# Patient Record
Sex: Female | Born: 1945
Health system: Southern US, Community
[De-identification: ages and names within clinical notes are randomized; demographics above are authoritative.]

## PROBLEM LIST (undated history)

## (undated) DIAGNOSIS — R202 Paresthesia of skin: Secondary | ICD-10-CM

## (undated) DIAGNOSIS — Z973 Presence of spectacles and contact lenses: Secondary | ICD-10-CM

## (undated) DIAGNOSIS — K759 Inflammatory liver disease, unspecified: Secondary | ICD-10-CM

## (undated) DIAGNOSIS — M48061 Spinal stenosis, lumbar region without neurogenic claudication: Secondary | ICD-10-CM

## (undated) DIAGNOSIS — M1990 Unspecified osteoarthritis, unspecified site: Secondary | ICD-10-CM

## (undated) DIAGNOSIS — I10 Essential (primary) hypertension: Secondary | ICD-10-CM

## (undated) DIAGNOSIS — E119 Type 2 diabetes mellitus without complications: Secondary | ICD-10-CM

## (undated) DIAGNOSIS — R200 Anesthesia of skin: Secondary | ICD-10-CM

## (undated) DIAGNOSIS — R410 Disorientation, unspecified: Secondary | ICD-10-CM

## (undated) DIAGNOSIS — R351 Nocturia: Secondary | ICD-10-CM

## (undated) DIAGNOSIS — R413 Other amnesia: Secondary | ICD-10-CM

## (undated) HISTORY — PX: BACK SURGERY: SHX140

## (undated) HISTORY — PX: COLONOSCOPY: SHX174

## (undated) HISTORY — DX: Essential (primary) hypertension: I10

## (undated) HISTORY — DX: Spinal stenosis, lumbar region without neurogenic claudication: M48.061

## (undated) HISTORY — PX: JOINT REPLACEMENT: SHX530

## (undated) HISTORY — DX: Unspecified osteoarthritis, unspecified site: M19.90

---

## 1999-04-16 ENCOUNTER — Other Ambulatory Visit: Admission: RE | Admit: 1999-04-16 | Discharge: 1999-04-16 | Payer: Self-pay | Admitting: Obstetrics and Gynecology

## 2000-01-15 ENCOUNTER — Other Ambulatory Visit: Admission: RE | Admit: 2000-01-15 | Discharge: 2000-01-15 | Payer: Self-pay | Admitting: Obstetrics and Gynecology

## 2001-12-27 ENCOUNTER — Emergency Department (HOSPITAL_COMMUNITY): Admission: EM | Admit: 2001-12-27 | Discharge: 2001-12-27 | Payer: Self-pay | Admitting: *Deleted

## 2001-12-27 ENCOUNTER — Encounter: Payer: Self-pay | Admitting: *Deleted

## 2008-07-31 ENCOUNTER — Encounter: Payer: Self-pay | Admitting: Family Medicine

## 2009-08-10 ENCOUNTER — Encounter: Payer: Self-pay | Admitting: Family Medicine

## 2010-01-15 ENCOUNTER — Encounter: Payer: Self-pay | Admitting: Family Medicine

## 2010-01-16 LAB — CONVERTED CEMR LAB
HDL: 73 mg/dL
LDL Cholesterol: 95 mg/dL

## 2010-01-29 ENCOUNTER — Encounter: Payer: Self-pay | Admitting: Family Medicine

## 2010-01-29 ENCOUNTER — Ambulatory Visit: Payer: Self-pay | Admitting: Anesthesiology

## 2010-04-10 ENCOUNTER — Encounter: Payer: Self-pay | Admitting: Family Medicine

## 2010-04-10 LAB — CONVERTED CEMR LAB: Pap Smear: ABNORMAL

## 2010-05-21 ENCOUNTER — Ambulatory Visit: Payer: Self-pay

## 2010-05-21 ENCOUNTER — Encounter: Payer: Self-pay | Admitting: Family Medicine

## 2010-06-17 ENCOUNTER — Encounter: Admission: RE | Admit: 2010-06-17 | Discharge: 2010-06-17 | Payer: Self-pay | Admitting: Chiropractor

## 2010-08-06 ENCOUNTER — Ambulatory Visit: Payer: Self-pay | Admitting: Family Medicine

## 2010-08-06 DIAGNOSIS — B351 Tinea unguium: Secondary | ICD-10-CM | POA: Insufficient documentation

## 2010-08-06 DIAGNOSIS — I10 Essential (primary) hypertension: Secondary | ICD-10-CM | POA: Insufficient documentation

## 2010-08-06 DIAGNOSIS — L680 Hirsutism: Secondary | ICD-10-CM | POA: Insufficient documentation

## 2010-08-07 ENCOUNTER — Encounter: Payer: Self-pay | Admitting: Family Medicine

## 2010-08-13 ENCOUNTER — Ambulatory Visit: Payer: Self-pay | Admitting: Family Medicine

## 2010-08-19 ENCOUNTER — Encounter: Payer: Self-pay | Admitting: Family Medicine

## 2010-09-24 ENCOUNTER — Encounter: Payer: Self-pay | Admitting: Family Medicine

## 2010-09-29 ENCOUNTER — Encounter: Payer: Self-pay | Admitting: Unknown Physician Specialty

## 2010-10-08 NOTE — Assessment & Plan Note (Signed)
Summary: shingles  Nurse Visit   Allergies: No Known Drug Allergies  Immunizations Administered:  Zostavax # 0:    Vaccine Type: Zostavax    Site: left deltoid    Mfr: Merck    Dose: 0.5 ml    Route: [place]    Given by: Lewanda Rife LPN    Exp. Date: 04/26/2011    Lot #: [id number]    VIS given: 06/20/05 given August 13, 2010.  Orders Added: 1)  Zoster (Shingles) Vaccine Live [90736] 2)  Admin 1st Vaccine [id number]

## 2010-10-08 NOTE — Assessment & Plan Note (Signed)
Summary: NEW PT TO EST/CLE   Vital Signs:  Patient profile:   65 year old female Height:      62.50 inches Weight:      210.50 pounds BMI:     38.02 Temp:     98.7 degrees F oral Pulse rate:   68 / minute Pulse rhythm:   regular BP sitting:   124 / 82  (right arm) Cuff size:   large  Vitals Entered By: Linde Gillis CMA Duncan Dull) (August 06, 2010 10:52 AM) CC: new patient, establish care   History of Present Illness: 65 yo here to establish care with multiple concerns.  1 . Back pain- was seeing Dr. Dorthey Sawyer, chiropractor for sciatica.  Brings in MRI results today, has appt with ortho next week. MRI reviewed- shows degenerative disc disease at multiple levels, anterolisthesis, disc buldging. Pt denies any LE weakness or urinary syptoms.  2.  HTN- on Lsinipril/HCTZ 10/12.5 mg daily.  No CP, blurred vision, or SOB.  3.  Nail fungus- has noticed toe nail fungus on her big toes.  Using Vicks vapor rub, not helping.  4.  Facial hair- wants me to prescribe prescription strength hair removal products for her unwanted facial hair.  Brings in lab results, mammogram and pap smear results.  Preventive Screening-Counseling & Management  Caffeine-Diet-Exercise     Does Patient Exercise: no      Drug Use:  no.    Allergies (verified): No Known Drug Allergies  Past History:  Family History: Last updated: 08/06/2010 Family History Hypertension Family History Ovarian cancer  Social History: Last updated: 08/06/2010 Alcohol use-no Drug use-no Regular exercise-no  Risk Factors: Exercise: no (08/06/2010)  Past Medical History: Hypertension  Past Surgical History: Denies surgical history  Family History: Family History Hypertension Family History Ovarian cancer  Social History: Alcohol use-no Drug use-no Regular exercise-no Drug Use:  no Does Patient Exercise:  no  Review of Systems      See HPI General:  Denies malaise. Eyes:  Denies blurring. ENT:  Denies  difficulty swallowing. CV:  Denies chest pain or discomfort. Resp:  Denies shortness of breath. GI:  Denies abdominal pain and change in bowel habits. GU:  Denies dysuria. MS:  Denies muscle weakness. Derm:  Denies rash. Neuro:  Complains of tingling; denies weakness. Psych:  Denies anxiety and depression. Endo:  Denies cold intolerance and heat intolerance. Heme:  Denies abnormal bruising and enlarge lymph nodes.  Physical Exam  General:  alert and overweight-appearing.   Head:  normocephalic, atraumatic, and no abnormalities observed.   Eyes:  vision grossly intact, pupils equal, pupils round, and pupils reactive to light.   Ears:  R ear normal and L ear normal.   Nose:  no external deformity.   Mouth:  good dentition.   Neck:  No deformities, masses, or tenderness noted. Lungs:  Normal respiratory effort, chest expands symmetrically. Lungs are clear to auscultation, no crackles or wheezes. Heart:  Normal rate and regular rhythm. S1 and S2 normal without gallop, murmur, click, rub or other extra sounds. Abdomen:  Bowel sounds positive,abdomen soft and non-tender without masses, organomegaly or hernias noted. Msk:  normal ROM.   Extremities:  no edema onchomycosis of great toe nails bilaterally. Neurologic:  gait normal.   Skin:  Intact without suspicious lesions or rashes Psych:  Cognition and judgment appear intact. Alert and cooperative with normal attention span and concentration. No apparent delusions, illusions, hallucinations   Impression & Recommendations:  Problem # 1:  DERMATOPHYTOSIS OF  NAIL (ICD-110.1) Assessment New advised trying OTC lamisil first, return to clinic if no improvement within several weeks.  Problem # 2:  HIRSUTISM (ICD-704.1) Time spent with patient 45 minutes, more than 50% of this time was spent counseling patient on her on several issues, including nutrition, zostavax, and her other concerns. She was upset that I refused to prescribe rx for  hair removal but this is outside my scope of practice.  Offered to sent to dermatology but she refused.  Problem # 3:  HYPERTENSION (ICD-401.9) Assessment: Unchanged Stable on current dose of meds. BMET within normal limits. Her updated medication list for this problem includes:    Lisinopril-hydrochlorothiazide 10-12.5 Mg Tabs (Lisinopril-hydrochlorothiazide) .Marland Kitchen... Take one tablet by mouth daily  Complete Medication List: 1)  Lisinopril-hydrochlorothiazide 10-12.5 Mg Tabs (Lisinopril-hydrochlorothiazide) .... Take one tablet by mouth daily 2)  Super B Complex Tabs (B complex-c) .... Take one tablet by mouth daily 3)  Centrum Silver Tabs (Multiple vitamins-minerals) .... Take one tablet by mouth daily 4)  Fish Oil 1000 Mg Caps (Omega-3 fatty acids) .... Take one tablet by mouth daily 5)  Calcium-vitamin D 600-200 Mg-unit Tabs (Calcium-vitamin d) .... Take one tablet by mouth daily 6)  Vitamin D3 5000 Unit Caps (Cholecalciferol) .... Take one tablet by mouth daily 7)  Mineral Rich  .... Take one tablet by mouth daily 8)  Memorall  .... Take one tablet by mouth daily  Other Orders: Tdap => 46yrs IM (16109) Admin 1st Vaccine (60454)   Orders Added: 1)  Tdap => 54yrs IM [90715] 2)  Admin 1st Vaccine [90471] 3)  New Patient Level IV [09811]   Immunizations Administered:  Tetanus Vaccine:    Vaccine Type: Tdap    Site: left deltoid    Mfr: GlaxoSmithKline    Dose: 0.5 ml    Route: IM    Given by: Linde Gillis CMA (AAMA)    Exp. Date: 06/27/2012    Lot #: [id number]    VIS given: 07/26/08 version given August 06, 2010.   Immunizations Administered:  Tetanus Vaccine:    Vaccine Type: Tdap    Site: left deltoid    Mfr: GlaxoSmithKline    Dose: 0.5 ml    Route: IM    Given by: Linde Gillis CMA (AAMA)    Exp. Date: 06/27/2012    Lot #: [id number]    VIS given: 07/26/08 version given August 06, 2010.  Current Allergies (reviewed today): No known allergies   TD  Result Date:  08/06/2010 TD Result:  given HDL Result Date:  01/16/2010 HDL Result:  73 LDL Result Date:  01/16/2010 LDL Result:  95 PAP Result Date:  04/10/2010 PAP Result:  abnormal Mammogram Result Date:  01/29/2010 Mammogram Result:  normal Mammogram Next Due:  1 yr    Past Medical History:    Hypertension  Past Surgical History:    Denies surgical history

## 2010-10-08 NOTE — Letter (Signed)
Summary: [facility]  Dept [facility]ammography/[facility]   Imported By: Lester Golf 08/10/2010 10:03:27  _____________________________________________________________________  External Attachment:    Type:   Image     Comment:   External Document

## 2010-10-08 NOTE — Consult Note (Signed)
Summary: Celesta Gentile MD  Celesta Gentile MD   Imported By: Lester Gene Autry 08/15/2010 13:12:18  _____________________________________________________________________  External Attachment:    Type:   Image     Comment:   External Document

## 2010-10-10 NOTE — Miscellaneous (Signed)
Summary: [facility]  [facility]   Imported By: Beau Fanny 10/02/2010 10:01:35  _____________________________________________________________________  External Attachment:    Type:   Image     Comment:   External Document

## 2010-10-10 NOTE — Procedures (Signed)
Summary: Colonoscopy by Dr. Sunny Schlein  Colonoscopy by Dr. Sunny Schlein   Imported By: Beau Fanny 08/23/2010 16:06:31  _____________________________________________________________________  External Attachment:    Type:   Image     Comment:   External Document  Appended Document: Colonoscopy by Dr. Sunny Schlein diverticulosis of descending and sigmoid colon. repeat in 5-10 years for screening

## 2011-05-15 ENCOUNTER — Emergency Department (HOSPITAL_COMMUNITY)
Admission: EM | Admit: 2011-05-15 | Discharge: 2011-05-15 | Discharge status: Against Medical Advice | Payer: Self-pay | Attending: Emergency Medicine | Admitting: Emergency Medicine

## 2011-05-15 DIAGNOSIS — Z0389 Encounter for observation for other suspected diseases and conditions ruled out: Secondary | ICD-10-CM | POA: Insufficient documentation

## 2011-05-16 ENCOUNTER — Encounter: Payer: Self-pay | Admitting: Family Medicine

## 2011-05-16 ENCOUNTER — Emergency Department (HOSPITAL_COMMUNITY): Payer: Self-pay

## 2011-05-16 ENCOUNTER — Emergency Department (HOSPITAL_COMMUNITY)
Admission: EM | Admit: 2011-05-16 | Discharge: 2011-05-16 | Disposition: A | Discharge status: Home / Self Care | Payer: Self-pay | Attending: Emergency Medicine | Admitting: Emergency Medicine

## 2011-05-16 DIAGNOSIS — M79609 Pain in unspecified limb: Secondary | ICD-10-CM | POA: Insufficient documentation

## 2011-05-16 DIAGNOSIS — M5430 Sciatica, unspecified side: Secondary | ICD-10-CM | POA: Insufficient documentation

## 2011-05-16 DIAGNOSIS — I10 Essential (primary) hypertension: Secondary | ICD-10-CM | POA: Insufficient documentation

## 2011-05-16 LAB — HM PAP SMEAR

## 2011-05-16 LAB — HM MAMMOGRAPHY

## 2011-05-19 ENCOUNTER — Ambulatory Visit (INDEPENDENT_AMBULATORY_CARE_PROVIDER_SITE_OTHER): Payer: Self-pay | Admitting: Family Medicine

## 2011-05-19 VITALS — BP 140/100 | HR 61 | Temp 97.4°F

## 2011-05-19 DIAGNOSIS — M5430 Sciatica, unspecified side: Secondary | ICD-10-CM

## 2011-05-19 MED ORDER — OXYCODONE-ACETAMINOPHEN 10-325 MG PO TABS: 1.0000 | ORAL_TABLET | Freq: Four times a day (QID) | ORAL | Status: DC | PRN

## 2011-05-19 NOTE — Progress Notes (Signed)
Subjective:    Patient ID: [name], female    DOB: [birth year], 65 y.o.   MRN: [id number]  HPI  65 yo here for ER follow up.  Has known h/o L4-L5 spinal stenosis, left sided radiculopathy. Was being followed by [facility].  Stopped going because pt did not want to take prednisone regularly. Has also been seeing her chiropractor regularly which was not helping.    MRI in 05/2010 showed L4_L5 buldging disc with spinal stenosis measuring 8 mm and arthritis.  Went to ER over the weekend because she could not walk at all without severe pain. Pain is mainly left lower leg, with severe tingling as well.  Notes reviewed.    Xray of SI joints neg,  Lumbar spine film showed multilevel degenerative disc and joint disease, primarily L4-5 with grade 1 spondyloisthesis. Given Norco and advised to follow up with Ortho.  Pt here today with her son- very tearful.  Has lost her insurance and cannot afford to return to ortho but cannot continue to be in this much pain. According to her son, she is receiving assistance through  assistance program and would like to be referred [facility].  Patient Active Problem List  Diagnoses  . DERMATOPHYTOSIS OF NAIL  . HYPERTENSION  . HIRSUTISM  . Sciatica neuralgia   Past Medical History  Diagnosis Date  . Hypertension    No past surgical history on file. History  Substance Use Topics  . Smoking status: Not on file  . Smokeless tobacco: Not on file  . Alcohol Use: No   Family History  Problem Relation Age of Onset  . Hypertension Other   . Cancer Other     ovarian   No Known Allergies Current Outpatient Prescriptions on File Prior to Visit  Medication Sig Dispense Refill  . lisinopril-hydrochlorothiazide (PRINZIDE,ZESTORETIC) 10-12.5 MG per tablet Take 1 tablet by mouth daily.        . B Complex-C (SUPER B COMPLEX PO) Take 1 tablet by mouth daily.        . Calcium Carbonate-Vitamin D  (CALCIUM-VITAMIN D) 600-200 MG-UNIT CAPS Take 1 capsule by mouth daily.        . Cholecalciferol (VITAMIN D-3) 5000 UNITS TABS Take 1 tablet by mouth daily.        . Multiple Vitamins-Minerals (CENTRUM SILVER PO) Take 1 tablet by mouth daily.        . NON FORMULARY Memoral (take one tablet by mouth daily)       . NON FORMULARY Mineral Rich (take one tablet by mouth daily)       . Omega-3 Fatty Acids (FISH OIL) 1000 MG CAPS Take 1 capsule by mouth daily.         The PMH, PSH, Social History, Family History, Medications, and allergies have been reviewed in [facility], and have been updated if relevant.   Review of Systems    See HPI Objective:   Physical Exam BP 140/100  Pulse 61  Temp(Src) 97.4 F (36.3 C) (Oral) Gen:  Alert, tearful, appears uncomfortable while seated. Psych:  Anxious appearing.      Assessment & Plan:   1. Sciatica neuralgia  Ambulatory referral to Pain Clinic   >25 min spent with face to face with patient, >50% counseling and/or coordinating care. Unfortunately, pt has lost her insurance and has worsening of her left sided radiculopathy. Will refer to [facility]. Given rx for Percocet. See pt instructions for  details.

## 2011-05-19 NOTE — Patient Instructions (Signed)
Please stop by to see [name] on your way out. Please do not take Tylenol of any form while you are taking Percocet.

## 2011-05-21 ENCOUNTER — Telehealth: Payer: Self-pay | Admitting: *Deleted

## 2011-05-21 NOTE — Telephone Encounter (Signed)
Patient is asking for her x-ray results.  She is questioning if her hip is broken.  She says she is doing worse.

## 2011-05-21 NOTE — Telephone Encounter (Signed)
Her SI joints and tail bone were normal bilaterally. Her back xray showed the changes we would expect based on her MRI done last year. There were no fractures evident. Once again, if her pain is worsening, I strongly urge her to see Dr. Gerrit Heck immediately so we can work this up further.

## 2011-05-21 NOTE — Telephone Encounter (Signed)
Called patient and advised her as instructed via telephone.  She stated that her main question is was there anything significant seen on her hip xray that may be the cause of her pain?  Fracture, etc other than inflammation?  Please advise.

## 2011-05-21 NOTE — Telephone Encounter (Signed)
Advised patient of results  Patient says that she will call and set up appt with ortho

## 2011-05-21 NOTE — Telephone Encounter (Signed)
We didn't do xrays here.  They were done in ER. Please print xray results from ER note and give to pt. If her pain is worse, we really need her to see her orthopedist, Dr. Gerrit Heck, until we can get in with pain management.

## 2011-06-03 ENCOUNTER — Telehealth: Payer: Self-pay | Admitting: *Deleted

## 2011-06-03 NOTE — Telephone Encounter (Signed)
I cannot safely increase the dose as I do not manage chronic pain. Once she has her appointment with pain management, hopefully they can improve her pain.

## 2011-06-03 NOTE — Telephone Encounter (Signed)
Patient advised as instructed via telephone.  She stated that her appt with pain management is this Friday.  She will let us know how it goes.

## 2011-06-03 NOTE — Telephone Encounter (Signed)
Pt states that her pain meds only help her pain for about 2 hours. She is asking to increase the doses.  Please advise.

## 2011-06-06 ENCOUNTER — Encounter: Payer: Self-pay | Attending: Physical Medicine and Rehabilitation | Admitting: Physical Medicine and Rehabilitation

## 2011-06-06 ENCOUNTER — Other Ambulatory Visit: Payer: Self-pay | Admitting: *Deleted

## 2011-06-06 DIAGNOSIS — M79609 Pain in unspecified limb: Secondary | ICD-10-CM | POA: Insufficient documentation

## 2011-06-06 DIAGNOSIS — M545 Low back pain, unspecified: Secondary | ICD-10-CM | POA: Insufficient documentation

## 2011-06-06 DIAGNOSIS — M48061 Spinal stenosis, lumbar region without neurogenic claudication: Secondary | ICD-10-CM

## 2011-06-06 DIAGNOSIS — IMO0002 Reserved for concepts with insufficient information to code with codable children: Secondary | ICD-10-CM | POA: Insufficient documentation

## 2011-06-06 DIAGNOSIS — G894 Chronic pain syndrome: Secondary | ICD-10-CM

## 2011-06-06 MED ORDER — OXYCODONE-ACETAMINOPHEN 10-325 MG PO TABS: 1.0000 | ORAL_TABLET | Freq: Four times a day (QID) | ORAL | Status: DC | PRN

## 2011-06-06 NOTE — Telephone Encounter (Signed)
Rx written for #10  Will need to talk to Dr Dayton Martes about any more than this

## 2011-06-06 NOTE — Progress Notes (Signed)
[name] is a 65 year old divorced woman who is currently staying with her son, [name], who is in the room today for the history part of our history and physical.  Ms. Welle was referred kindly by Dr. Ruthe Mannan.  Ms. Tuel presents with over a year history of progressive left low back and leg pain..  She states that the leg pain is worse than the back at this time, stating she is not particularly painful; however, when she gets up to stand or walk especially her leg pain increases significantly.  She has seen chiropractor for many visits.  She has also seen and followed up with orthopedist about 1 year ago, who did an MRI at that time, the date of the visit was August 07, 2010, Dr. Gerrit Heck at the [facility].  At that time, the MRI showed L4-5 bulging disk with spinal stenosis diameter measuring 8 mm, grade 1 anterolisthesis L4 and L5 with multilevel degenerative lumbar changes noted.  I am unclear whether or not she had a followup visit.  They had at that time recommended steroid followed by nonsteroidal, activity modification and consideration of Neurontin or Flexeril as well as physical therapy. Her son inquired about using long acting narcotics and seemed to have significant interest in this treatment option.  The patient denies problems controlling bowel or bladder.  She admits to trouble walking.  She can walk about 5 minutes at a time.  She does use a wheelchair for community distances, sometimes a cane or walker.  Pain is described as sharp, burning, dull, tingling and aching in her left low back and buttock region radiating to the left lateral leg.  She reports occasional numbness in her toes as well.  Pain is worse during the day, improves if she sitting or lying down, medication also helps.  She has been recently using hydrocodone as well as oxycodone.  Sometimes she takes more oxycodone, the bottle indicates.  FUNCTIONAL STATUS:  The patient  last worked approximately 1 month ago. She worked in [employer].  She is independent currently with feeding, dressing, bathing, and toileting as well as meal prep, needs assistance with household duties and shopping.  Denies problems controlling bowel or bladder.  Admits to trouble walking and depression.  Denies suicidal ideation at this time.  REVIEW OF SYSTEMS:  Otherwise noncontributory other than some occasional constipation.  PAST MEDICAL HISTORY:  Notable for hypertension.  PAST SURGICAL HISTORY:  Right ankle fracture.  She is divorced.  She is currently staying with her son.  Denies illegal substance use.  Denies alcohol or smoking.  FAMILY HISTORY:  Father deceased at heart disease.  Mother deceased at uterine cancer.  MEDICATIONS:  She brings in today include lisinopril, B complex, calcium with D, vitamin D, multivitamin, omega-3 and oxycodone 10/325.  She reports no known drug allergies.  PHYSICAL EXAMINATION:  On exam, her blood pressure is 152/59, pulse 68, respirations 16, 97% saturated on room air.  She is a well-developed and mildly obese woman who does not appear in any distress, although at times in the interview, she was rather labile, tearful multiple times, but then laughing at others.  She is alert.  She is cooperative and pleasant.  She follows commands without difficulty.  Answers my questions appropriately.  Cranial nerves and coordination are intact.  Her reflexes are 2+ at the patellar and Achilles tendon.  No abnormal tone, clonus or tremors are noted.  Her motor strength is 5/5 at hip flexors, knee extensors, dorsiflexors,  plantar flexors, EHL.  Tandem gait and Romberg test are performed adequately.  She reports intact sensation to pinprick; however, in the lower extremities along the left lateral leg, she reports slightly decreased sensation to light touch.  Internal and external rotation at the hips does not aggravate posterior hip or groin pain.   She has well- preserved range of motion at the knees.  She has limitations in lumbar extension.  She has about 40-50 degrees of forward flexion.  She reports some pain with forward flexion as well as with extension.  IMPRESSION: 1. Lumbago. 2. Left lower extremity pain, most likely neuropathic in nature. 3. Multilevel lumbar degenerative disk disease. 4. L4-5 grade 1 anterolisthesis.  Review of radiographs on E-chart:  We will check urine drug screen.  I am considering use of gabapentin, it does not appear she has been trialed on it per records or from her history.  I am also considering repeating the MRI.  She tells me that she has limited finacial resources and she is not sure she can participate in physical therapy.  On a discussion about epidural steroid injection, she tells me that they scare her and that she may not be able to afford them either.  She seems most interested in medication management, physical therapy may be of benefit as well, here however I am not sure she is interested in pursuing that avenue of treatment.  I am going to see her back in 3 weeks to review UDS and determine treatment options.  I will try to obtain notes from previous orthopedist to review which meds she has trialed.  She seemed reluctant about the current plan. I have tried to explain to her that here at [facility], prior to prescribing medication, we typically check urine drug screen.     Brantley Stage, M.D. Electronically Signed    DMK/MedQ D:  06/06/2011 13:37:10  T:  06/06/2011 22:29:51  Job #:  [id number]

## 2011-06-06 NOTE — Telephone Encounter (Signed)
Spoke with patient and advised results, she will stop by and pick up rx

## 2011-06-06 NOTE — Telephone Encounter (Signed)
Pt states she went to pain clinic and they are going to give her another medicine, but they are checking her urine, and they told her that it could be 10-15 days before she can get anything.  She is worried that she will run out of percocet before she can get a refill on Monday from Dr. Dayton Martes, she takes 4 a day and has 8 left.  She is asking for just a few.

## 2011-06-09 ENCOUNTER — Other Ambulatory Visit: Payer: Self-pay | Admitting: *Deleted

## 2011-06-09 NOTE — Telephone Encounter (Signed)
Pt says she's trying to change her pain management doctor to another doctor within the same practice.  She is not happy with who she saw, doesn't think she has her best interest in mind.  The doctors told her they will have a meeting about this and will let her know.  In the meantime pt will run out of percocet.  I advised her you are out till Wednesday, she says she thinks she has enough medicine to last till then.

## 2011-06-11 MED ORDER — OXYCODONE-ACETAMINOPHEN 10-325 MG PO TABS
1.0000 | ORAL_TABLET | Freq: Four times a day (QID) | ORAL | Status: DC | PRN
Start: 2011-06-11 — End: 2011-07-15

## 2011-06-11 MED ORDER — OXYCODONE-ACETAMINOPHEN 10-325 MG PO TABS: 1.0000 | ORAL_TABLET | Freq: Four times a day (QID) | ORAL | Status: DC | PRN

## 2011-06-11 NOTE — Telephone Encounter (Signed)
Please telephone in as written below.

## 2011-06-11 NOTE — Telephone Encounter (Signed)
Patient advised Rx will be left at front desk for pick up.

## 2011-06-11 NOTE — Telephone Encounter (Signed)
Rx can't be called in, printed and put in your IN box for signature.

## 2011-06-16 ENCOUNTER — Other Ambulatory Visit: Payer: Self-pay | Admitting: *Deleted

## 2011-06-16 MED ORDER — LISINOPRIL-HYDROCHLOROTHIAZIDE 10-12.5 MG PO TABS: 1.0000 | ORAL_TABLET | Freq: Every day | ORAL | Status: DC

## 2011-06-16 NOTE — Assessment & Plan Note (Signed)
Phone call to [name] [phone] at approximately 11 o'clock.  I called [name] this morning to discuss her last clinic visit. She had expressed some dissatisfaction at the visit and felt that I had not listened to her as closely as I could and she was upset that her son was requested to leave the room during her physical exam.  I told her that her son was asking multiple questions during her history that I was taking, and was making it difficult for me to focus on her alone.  She stated that she realized that he was distracting during that time and she requested that he be allowed to stay in the room next time and she will ask him to remain quiet with the exception for some questions at the end of our visit.  I mentioned to her that I was able to obtain records from the [facility] that she had gone to last year.  I had incomplete records from that clinic at the time of her visit.  I was trying to determine what course of treatment she may have had.  It was my understanding at the last visit she was going to be set up for an epidural steroid injection at the L4-5 level but I believe she declined that option and did not return back to the clinic.  I have also reviewed her urine drug screen with her and told her I would like to consider repeating another lumbar MRI since it has been over 1 year since she has had one and her symptoms seem to be progressing.  She agreed to this but also mentioned that her finances do constrain her.  I gave her the number of the [facility] Financial Aid Service representative [phone].  She stated she would like to call them and see if they can help her.  She told me she would like to find out first if she can get any financial help prior to making another appointment here at our clinic.  She then asked me what the name of the medicine I was considering trying her on and I told her that I typically may try gabapentin for  symptoms of spinal stenosis.  She stated she would see if her PCP might prescribe that  for her.  I asked her several times if there is anything more I can help her with and also mentioned to her that there may be other treatment options available to her that I could review at the next visit.  Once again I clarified that it is my understanding she would like to follow up with financial aid services here at [facility] to determine what they can do for her and we will hold off on making another appointment with our clinic until she finds out what is available financially.  She states she is satisfied with what we have reviewed today in our phone conversations.  She will let us know if she has any other questions.     Brantley Stage, M.D. Electronically Signed    DMK/MedQ D:  06/13/2011 11:47:04  T:  06/13/2011 22:30:57  Job #:  [id number]

## 2011-06-17 ENCOUNTER — Other Ambulatory Visit: Payer: Self-pay | Admitting: *Deleted

## 2011-06-17 MED ORDER — LISINOPRIL-HYDROCHLOROTHIAZIDE 10-12.5 MG PO TABS: 1.0000 | ORAL_TABLET | Freq: Every day | ORAL | Status: DC

## 2011-06-27 ENCOUNTER — Other Ambulatory Visit: Payer: Self-pay | Admitting: Physical Medicine and Rehabilitation

## 2011-06-27 ENCOUNTER — Encounter: Payer: Self-pay | Attending: Physical Medicine and Rehabilitation | Admitting: Physical Medicine and Rehabilitation

## 2011-06-27 DIAGNOSIS — M545 Low back pain, unspecified: Secondary | ICD-10-CM

## 2011-06-27 DIAGNOSIS — M79604 Pain in right leg: Secondary | ICD-10-CM

## 2011-06-27 DIAGNOSIS — M79609 Pain in unspecified limb: Secondary | ICD-10-CM | POA: Insufficient documentation

## 2011-06-27 DIAGNOSIS — IMO0002 Reserved for concepts with insufficient information to code with codable children: Secondary | ICD-10-CM | POA: Insufficient documentation

## 2011-06-27 DIAGNOSIS — M48061 Spinal stenosis, lumbar region without neurogenic claudication: Secondary | ICD-10-CM

## 2011-06-27 DIAGNOSIS — M4310 Spondylolisthesis, site unspecified: Secondary | ICD-10-CM

## 2011-06-28 NOTE — Assessment & Plan Note (Signed)
[name] is a pleasant 65 year old woman who was initially seen by me on June 06, 2011.  She has a history of chronic low back pain with pain radiating into the left lower extremity.  Pain is typically worse when she is up walking or standing.  She has started using a wheelchair to allow herself to walk further.  She uses it as almost a rolling walker.  This allowed her to walk a further distance.  She can walk typically with the wheelchair 30-60 minutes as she is indicated on my health and history form.  She is able to climb stairs.  She does drive.  She has had approximately 2 month history of increasing low back and leg pain.  She was also seen about a year ago by Orthopedics who indicated in their note, she was treated with oral steroids followed by nonsteroidal anti-inflammatory medications.  However, she does not recall that these were used on her.  Her average pain is about a 5 on a scale of 10 currently.  Pain is worse with walking and standing, improves with rest and medication.  Sleep is relatively good.  She gets fair relief with current medications.  She has been using oxycodone 10/325 3-4 times a day as well as an over- the-counter medication which she found called Sciatica Relief by [employer].  She states that this Sciatica Relief medication seems to be helping her somewhat.  REVIEW OF SYSTEMS:  Negative for problems controlling bowel or bladder. She has had some problems with diarrhea.  She admits to trouble walking and confusion.  Denies suicidal ideation.  Review of systems otherwise negative.  No change in past medical, social, or family history.  PHYSICAL EXAMINATION:  Today, her blood pressure is 149/66, pulse 69, respiration 14, 94% saturated on room air.  She is a well-developed and well-nourished woman who does not appear in any distress.  She was somewhat labile throughout the interview again today, at times she was laughing and at times was near  tears.  She is oriented x3.  Her speech is clear.  She is alert and cooperative. She follows my commands without difficulty, and was answering my questions appropriately.  Her cranial nerves and coordination are grossly intact.  Her reflexes are 2+ at the patellar and Achilles tendon.  No abnormal tone, clonus, or tremors are noted.  Motor strength is good in both lower extremities.  Straight leg raise is negative.  Significantly on ambulating, she is able to transition from sitting to standing; however, she walks with forward-flexed spine.  She has very little lumbar extension and has difficulty straightening up entirely.  IMPRESSION: 1. Lumbago with symptoms of spinal stenosis. 2. Multilevel lumbar degenerative disk disease. 3. L4-5 grade 1 anterolisthesis.  PLAN:  [name] has indicated she would like to minimize the amount of opioid narcotics she is on and at one point she wanted to wean off her oxycodone; however, she is very concerned that her pain will not be well controlled if she is on it.  I discussed some options such as trialing gabapentin with her to see if we can improve the leg pain while she is up on standing and walking, she would like to do this.  She has 14 oxycodone 10/325 pills left and I will give her 36 more pills, so that she can take 1 pill 3 times a day, and have a 2 week supply.  I will see her back in 2 weeks.  I have given  her a prescription for gabapentin that titrates her up to 4 times a day on 300 mg.  I will also obtain a lumbar MRI to evaluate her lumbar spine further in considering that she may want to pursue epidural steroid injection as well.  At this point, she appears to be comfortable with our current management plan. I have answered all her questions.  She has asked me a few questions about an over-the-counter medication she is on; however, I am not familiar with the contents of Sciatica Relief by [employer].  I will see her back in 2  weeks.     Brantley Stage, M.D. Electronically Signed    DMK/MedQ D:  06/27/2011 14:50:50  T:  06/28/2011 02:13:23  Job #:  [id number]

## 2011-07-03 ENCOUNTER — Ambulatory Visit (HOSPITAL_COMMUNITY)
Admission: RE | Admit: 2011-07-03 | Discharge: 2011-07-03 | Disposition: A | Discharge status: Home / Self Care | Payer: Self-pay | Source: Ambulatory Visit | Attending: Physical Medicine and Rehabilitation | Admitting: Physical Medicine and Rehabilitation

## 2011-07-03 DIAGNOSIS — M545 Low back pain, unspecified: Secondary | ICD-10-CM

## 2011-07-03 DIAGNOSIS — M5430 Sciatica, unspecified side: Secondary | ICD-10-CM | POA: Insufficient documentation

## 2011-07-03 DIAGNOSIS — M79604 Pain in right leg: Secondary | ICD-10-CM

## 2011-07-03 DIAGNOSIS — E278 Other specified disorders of adrenal gland: Secondary | ICD-10-CM | POA: Insufficient documentation

## 2011-07-03 DIAGNOSIS — M48061 Spinal stenosis, lumbar region without neurogenic claudication: Secondary | ICD-10-CM | POA: Insufficient documentation

## 2011-07-03 DIAGNOSIS — M5126 Other intervertebral disc displacement, lumbar region: Secondary | ICD-10-CM | POA: Insufficient documentation

## 2011-07-03 DIAGNOSIS — M519 Unspecified thoracic, thoracolumbar and lumbosacral intervertebral disc disorder: Secondary | ICD-10-CM | POA: Insufficient documentation

## 2011-07-11 ENCOUNTER — Encounter: Payer: Self-pay | Attending: Physical Medicine and Rehabilitation | Admitting: Physical Medicine and Rehabilitation

## 2011-07-11 DIAGNOSIS — M48061 Spinal stenosis, lumbar region without neurogenic claudication: Secondary | ICD-10-CM | POA: Insufficient documentation

## 2011-07-11 DIAGNOSIS — M545 Low back pain, unspecified: Secondary | ICD-10-CM | POA: Insufficient documentation

## 2011-07-11 DIAGNOSIS — M5380 Other specified dorsopathies, site unspecified: Secondary | ICD-10-CM

## 2011-07-11 DIAGNOSIS — M79609 Pain in unspecified limb: Secondary | ICD-10-CM | POA: Insufficient documentation

## 2011-07-11 DIAGNOSIS — E279 Disorder of adrenal gland, unspecified: Secondary | ICD-10-CM | POA: Insufficient documentation

## 2011-07-11 DIAGNOSIS — Q762 Congenital spondylolisthesis: Secondary | ICD-10-CM | POA: Insufficient documentation

## 2011-07-11 DIAGNOSIS — G8929 Other chronic pain: Secondary | ICD-10-CM | POA: Insufficient documentation

## 2011-07-12 NOTE — Assessment & Plan Note (Signed)
[name] is a pleasant 65 year old woman who was initially seen on June 06, 2011.  She has approximately a year history of chronic low back pain and lower extremity pain which limited her ability to ambulate.  At the last visit, an MRI was repeated.  She did have a history of grade 1 L4-5 spondylolisthesis and the results of the MRI are reviewed with her today.  She has multilevel facet joint degenerative changes, most prominent at L4-5 as well as L5-S1.  She does have multifactorial moderate spinal stenosis at L4-5 as well.  Incidentally, there was a 1.7- cm right adrenal nodule.  She states today "I do not have any pain right now."  Her pain is worse when she has been standing and after a couple of hours of standing, her pain can go up to an 8 on a scale of 10.  Pain is described as dull, tingling, and aching in nature.  Sleep is good. Pain is worse with walking and standing.  Improves with rest and medication.  She finds that she was placed on some gabapentin at the last visit.  She reports that it does seem to be helping.  FUNCTIONAL STATUS:  She can walk 5-10 minutes at a time.  She has been using a cane for about a year.  She is able to climb stairs and drive. She is independent with self-care currently.  Denies problems with bowel or bladder.  Admits occasional confusion.  Denies depression, anxiety, or suicidal ideation.  REVIEW OF SYSTEMS:  Otherwise, noncontributory.  She denies any history of heart attack, stroke, or GI bleeding.  PHYSICAL EXAMINATION:  VITAL SIGNS:  Today, her blood pressure is 159/81, pulse 75, respirations 16, 96% saturated on room air. GENERAL:  She is mildly obese woman who does not appear in any distress. She is oriented x3.  Speech is clear.  Affect is bright.  She is alert, cooperative, and pleasant today.  Follows commands without difficulty. Answers my questions appropriately. NEUROLOGIC:  Cranial nerves and coordination are grossly  intact. MUSCULOSKELETAL:  Her reflexes are diminished in the lower extremities. There is no abnormal tone, clonus, or tremors.  Straight leg raise is negative.  She has good strength on the lower extremities, 5/5 at hip flexors, knee extensors, dorsiflexors, plantar flexors, EHL.  She is able to ambulate in the room and transfer from sitting to standing quite easily.  Tandem gait and Romberg test are performed adequately.  She has relatively good flexion of her lumbar spine today, extension is somewhat limited . While I was observing her gait and tandem gait in the room, she tells me she was doing quite well and she began to do a dance in the room.  IMPRESSION:  Lumbago with symptoms of spinal stenosis.  Lumbar MRI is attached to the chart.  PLAN:  We did discuss treatment options for her today.  She is doing significantly better than the last few weeks.  She believes she does not need to take her Percocet 3 times a day now.  We will decrease that to Percocet 7.5/325 b.i.d. #60.  We will continue gabapentin 300 mg 1 p.o. q.i.d. and I will give her a prescription for Naprosyn 500 mg 1 p.o. b.i.d., 10-day supply.  She requested a note as well to have some temporary limitations regarding her mobility for work as well as informing her employer that there may be some variability in her current day-to-day function.  There was an incidental finding on the  MRI of adrenal gland nodule, will be sending a copy to her primary care doctor and will inform her of this as well.  I have answered all her questions.  She seems to be comfortable with our treatment plan currently.  We will continue to follow her and monitor the use of her pain medications.     Brantley Stage, M.D. Electronically Signed    DMK/MedQ D:  07/11/2011 14:21:19  T:  07/12/2011 03:26:00  Job #:  [id number]

## 2011-07-15 ENCOUNTER — Encounter: Payer: Self-pay | Admitting: Family Medicine

## 2011-07-15 ENCOUNTER — Ambulatory Visit (INDEPENDENT_AMBULATORY_CARE_PROVIDER_SITE_OTHER): Payer: Self-pay | Admitting: Family Medicine

## 2011-07-15 VITALS — BP 122/86 | HR 85 | Temp 97.5°F | Wt 212.2 lb

## 2011-07-15 DIAGNOSIS — M961 Postlaminectomy syndrome, not elsewhere classified: Secondary | ICD-10-CM | POA: Insufficient documentation

## 2011-07-15 DIAGNOSIS — M549 Dorsalgia, unspecified: Secondary | ICD-10-CM

## 2011-07-15 DIAGNOSIS — M5430 Sciatica, unspecified side: Secondary | ICD-10-CM

## 2011-07-15 DIAGNOSIS — D3500 Benign neoplasm of unspecified adrenal gland: Secondary | ICD-10-CM | POA: Insufficient documentation

## 2011-07-15 NOTE — Patient Instructions (Signed)
Please stop by to see [name] on your way out. 

## 2011-07-15 NOTE — Progress Notes (Signed)
Subjective:    Patient ID: [name], female    DOB: 09/17/1945, 65 y.o.   MRN: [id number]  HPI  65 yo here for follow up.  Has known h/o L4-L5 spinal stenosis, left sided radiculopathy. Was being followed by [facility].  Stopped going because pt did not want to take prednisone regularly. Has also been seeing her chiropractor regularly which was not helping.   [facility]-  Feels pain has improved with Gabapentin.  MRI in 05/2010 showed L4-L5 buldging disc with spinal stenosis measuring 8 mm and arthritis.  MRI ordered by pain clinic last month. Results reviewed.  L5-S1 bulge. Superimposed left foraminal disc protrusion with  mild mass effect upon the exiting left L5 nerve root.  L4-5 prominent bilateral facet joint degenerative changes  containing fluid. 3.2 mm anterior slip of L4. Moderate  bulge/broad-based protrusion with extension into the neural foramen  and cephalad extension slightly greater on the left. Mild  encroachment upon but not significant compression of the exiting  left L4 nerve root. Multifactorial moderate spinal stenosis with a  trefoil appearance.  1.7 cm right adrenal nodule as noted above possibly an adenoma.  Advised to follow up here b/c of the adenoma.  Pt does not have h/o previous malignancy.   Patient Active Problem List  Diagnoses  . DERMATOPHYTOSIS OF NAIL  . HYPERTENSION  . HIRSUTISM  . Sciatica neuralgia  . Back pain   Past Medical History  Diagnosis Date  . Hypertension    No past surgical history on file. History  Substance Use Topics  . Smoking status: Not on file  . Smokeless tobacco: Not on file  . Alcohol Use: No   Family History  Problem Relation Age of Onset  . Hypertension Other   . Cancer Other     ovarian   No Known Allergies Current Outpatient Prescriptions on File Prior to Visit  Medication Sig Dispense Refill  . B Complex-C (SUPER B COMPLEX PO) Take 1 tablet by mouth daily.          . Calcium Carbonate-Vitamin D (CALCIUM-VITAMIN D) 600-200 MG-UNIT CAPS Take 1 capsule by mouth daily.        . Cholecalciferol (VITAMIN D-3) 5000 UNITS TABS Take 1 tablet by mouth daily.        Marland Kitchen HYDROcodone-acetaminophen (NORCO) 5-325 MG per tablet Take 1 tablet by mouth every 6 (six) hours as needed.        Marland Kitchen lisinopril-hydrochlorothiazide (PRINZIDE,ZESTORETIC) 10-12.5 MG per tablet Take 1 tablet by mouth daily.  90 tablet  1  . Multiple Vitamins-Minerals (CENTRUM SILVER PO) Take 1 tablet by mouth daily.        . NON FORMULARY Memoral (take one tablet by mouth daily)       . NON FORMULARY Mineral Rich (take one tablet by mouth daily)       . Omega-3 Fatty Acids (FISH OIL) 1000 MG CAPS Take 1 capsule by mouth daily.        Marland Kitchen oxyCODONE-acetaminophen (PERCOCET) 10-325 MG per tablet Take 1 tablet by mouth every 6 (six) hours as needed for pain.  60 tablet  0   The PMH, PSH, Social History, Family History, Medications, and allergies have been reviewed in [facility], and have been updated if relevant.   Review of Systems    See HPI Objective:   Physical Exam BP 122/86  Pulse 85  Temp(Src) 97.5 F (36.4 C) (Oral)  Wt 212 lb 4 oz (96.276 kg) Gen:  Alert, pleasant, NAD. Psych:  Anxious appearing.      Assessment & Plan:   1. Adrenal adenoma  CT Abdomen Wo Contrast, CT Abd Wo & W Cm    >15 min spent with face to face with patient, >50% counseling and/or coordinating care. Appears to be a benign incidentaloma but not well visualized on MRI. She is at low risk for malignancy but given that it wasn't well visualized and her level of anxiety, will go ahead and order CT scan to evaluate further. The patient indicates understanding of these issues and agrees with the plan.

## 2011-07-16 ENCOUNTER — Ambulatory Visit: Payer: Self-pay | Attending: Physical Medicine and Rehabilitation | Admitting: Physical Therapy

## 2011-07-16 DIAGNOSIS — R293 Abnormal posture: Secondary | ICD-10-CM | POA: Insufficient documentation

## 2011-07-16 DIAGNOSIS — IMO0001 Reserved for inherently not codable concepts without codable children: Secondary | ICD-10-CM | POA: Insufficient documentation

## 2011-07-16 DIAGNOSIS — M545 Low back pain, unspecified: Secondary | ICD-10-CM | POA: Insufficient documentation

## 2011-07-21 ENCOUNTER — Other Ambulatory Visit: Payer: Self-pay | Admitting: Family Medicine

## 2011-07-21 ENCOUNTER — Ambulatory Visit (INDEPENDENT_AMBULATORY_CARE_PROVIDER_SITE_OTHER)
Admission: RE | Admit: 2011-07-21 | Discharge: 2011-07-21 | Disposition: A | Discharge status: Home / Self Care | Payer: Self-pay | Source: Ambulatory Visit | Attending: Family Medicine | Admitting: Family Medicine

## 2011-07-21 ENCOUNTER — Ambulatory Visit: Payer: Self-pay

## 2011-07-21 DIAGNOSIS — D3500 Benign neoplasm of unspecified adrenal gland: Secondary | ICD-10-CM

## 2011-07-22 ENCOUNTER — Other Ambulatory Visit (INDEPENDENT_AMBULATORY_CARE_PROVIDER_SITE_OTHER): Payer: Self-pay

## 2011-07-22 DIAGNOSIS — D3500 Benign neoplasm of unspecified adrenal gland: Secondary | ICD-10-CM

## 2011-07-22 LAB — BASIC METABOLIC PANEL
Calcium: 9.2 mg/dL (ref 8.4–10.5)
GFR: 72.73 mL/min (ref >60.00)
Glucose, Bld: 85 mg/dL (ref 70–99)
Potassium: 4.1 mEq/L (ref 3.5–5.1)
Sodium: 145 mEq/L (ref 135–145)

## 2011-07-23 LAB — ACTH: C206 ACTH: 13 pg/mL (ref 10–46)

## 2011-07-28 NOTE — Progress Notes (Signed)
Addended by: Alvina Chou on: 07/28/2011 05:34 PM   Modules accepted: Orders

## 2011-07-29 ENCOUNTER — Ambulatory Visit: Payer: Self-pay | Admitting: Rehabilitation

## 2011-07-30 ENCOUNTER — Encounter: Payer: Self-pay | Admitting: Rehabilitation

## 2011-08-05 LAB — CORTISOL, URINE, FREE
Cortisol (Ur), Free: 42.5 mcg/24 h (ref 4.0–50.0)
RESULTS RECEIVED: 1.14 g/(24.h) (ref 0.63–2.50)

## 2011-08-06 ENCOUNTER — Ambulatory Visit: Payer: Self-pay

## 2011-08-06 ENCOUNTER — Encounter: Payer: Self-pay | Attending: Physical Medicine and Rehabilitation | Admitting: Physical Medicine and Rehabilitation

## 2011-08-06 DIAGNOSIS — M545 Low back pain, unspecified: Secondary | ICD-10-CM | POA: Insufficient documentation

## 2011-08-06 DIAGNOSIS — G894 Chronic pain syndrome: Secondary | ICD-10-CM

## 2011-08-06 DIAGNOSIS — Z79899 Other long term (current) drug therapy: Secondary | ICD-10-CM | POA: Insufficient documentation

## 2011-08-06 DIAGNOSIS — M79609 Pain in unspecified limb: Secondary | ICD-10-CM | POA: Insufficient documentation

## 2011-08-06 DIAGNOSIS — M5126 Other intervertebral disc displacement, lumbar region: Secondary | ICD-10-CM | POA: Insufficient documentation

## 2011-08-06 NOTE — Assessment & Plan Note (Signed)
[name] is a pleasant 65 year old woman who was last seen by me on July 11, 2011.  She has a history of low back pain and left lower extremity pain.  She has known L5-S1 disk bulge, which is superimposed on left foraminal disk protrusion with mild mass effect on the left L5 root.  She also has L4-5 bilateral joint degenerative changes, which contains fluid with 3.2 mm anterior slip of L4 on 5.  [name] is back in today.  Over the last month, she has been trialed on some gabapentin and was placed on some Naprosyn.  Her Percocet was reduced from 3 times a day down to twice a day.  She is back in and complains that the Percocet may have been too low for her. She thinks the gabapentin may be helping somewhat, but at around 11 a.m., it seems to worn off and she is frustrated by the increased pain she is experiencing around the noon hour time.  She has had some difficulty with work due to this problem.  Her average pain is about an 8 on a scale of 10, currently in clinic it is 5.  She tells me today her back is really not the main problem, it is her left leg.  I am wondering if the Naprosyn has worked somewhat for her on her facet joints today.  REVIEW OF SYSTEMS:  She continues to complain of some numbness and tingling especially in the left leg, otherwise negative.  PAST MEDICAL HISTORY:  Unchanged.  SOCIAL HISTORY:  Unchanged.  FAMILY HISTORY:  Unchanged.  PHYSICAL EXAMINATION:  Today, her blood pressure is 161/91, pulse 65, respirations 18, 96% saturated on room air.  She is 64 inches and 217 pounds.  She is a well-developed obese woman who does not appear in any distress.  She is oriented x3.  Speech is clear.  Affect is bright.  She is alert, cooperative, and pleasant.  Follows commands without difficulty.  Answers my questions appropriately.  At one point, she does become somewhat labile and cries when discussing her pain.  Cranial nerves and coordination are  grossly intact.  Her reflexes are intact in the lower extremities, diminished at the left Achilles tendon however.  Straight leg raise is negative.  She has good strength in both lower extremities at hip flexors, knee extensors, dorsiflexors, plantar flexors, EHL.  Transitioning from sitting to standing is done without difficulty.  Her gait is nonantalgic.  Tandem gait and Romberg test are performed adequately as well.  She has some limitations in lumbar motion in all planes however.  IMPRESSION: 1. Lumbago with superimposed left foraminal disk protrusion with mass     effect on the left exiting L5 root. 2. L4-5 prominent bilateral facet joint changes with listhesis of 3.2     mm of L4 on L5. 3. Lumbago and left lower extremity pain secondary to above.  PLAN:  I reviewed various treatment options with her today.  She would like to trial an increased dose of gabapentin, we will switch her from 300 mg to 400 mg one at 8 a.m., 12 p.m., 4 p.m., and two at nightly which is 8 p.m.  We will increase her Percocet from twice a day to 3 times a day 7.5/325 t.i.d.  She did consign the opiate consent today as well.  She is very interested in trialing a lumbar epidural steroid injection to see if we can improve her left leg pain.  Her back has been somewhat of  a problem, however recent treatment with NSAID seems to have helped somewhat as well.  I have answered all her questions.  She is comfortable with the plan.     Brantley Stage, M.D. Electronically Signed    DMK/MedQ D:  08/06/2011 14:10:37  T:  08/06/2011 23:44:54  Job #:  [id number]

## 2011-08-11 ENCOUNTER — Ambulatory Visit: Payer: Self-pay | Admitting: Physical Medicine and Rehabilitation

## 2011-08-12 ENCOUNTER — Ambulatory Visit: Payer: Self-pay | Attending: Physical Medicine and Rehabilitation | Admitting: Rehabilitation

## 2011-08-12 DIAGNOSIS — M545 Low back pain, unspecified: Secondary | ICD-10-CM | POA: Insufficient documentation

## 2011-08-12 DIAGNOSIS — R293 Abnormal posture: Secondary | ICD-10-CM | POA: Insufficient documentation

## 2011-08-12 DIAGNOSIS — IMO0001 Reserved for inherently not codable concepts without codable children: Secondary | ICD-10-CM | POA: Insufficient documentation

## 2011-08-13 ENCOUNTER — Encounter: Payer: Self-pay | Admitting: Rehabilitation

## 2011-08-14 ENCOUNTER — Ambulatory Visit: Payer: Self-pay | Admitting: Rehabilitation

## 2011-08-14 ENCOUNTER — Telehealth: Payer: Self-pay | Admitting: Internal Medicine

## 2011-08-14 NOTE — Telephone Encounter (Signed)
Patient called and would like to know her results of her 24hr. Urine.  Please advise.

## 2011-08-14 NOTE — Telephone Encounter (Signed)
Left message on machine for patient to return call

## 2011-08-14 NOTE — Telephone Encounter (Signed)
It was normal

## 2011-08-15 ENCOUNTER — Ambulatory Visit (INDEPENDENT_AMBULATORY_CARE_PROVIDER_SITE_OTHER): Payer: Self-pay

## 2011-08-15 DIAGNOSIS — Z23 Encounter for immunization: Secondary | ICD-10-CM

## 2011-08-18 ENCOUNTER — Encounter: Payer: Self-pay | Admitting: Rehabilitation

## 2011-08-18 NOTE — Telephone Encounter (Signed)
Called patient at home number, no answer or machine.  Will call back later. 

## 2011-08-19 ENCOUNTER — Ambulatory Visit: Payer: Self-pay

## 2011-08-19 NOTE — Telephone Encounter (Signed)
Left message on machine at home advising patient as instructed. 

## 2011-08-20 ENCOUNTER — Ambulatory Visit: Payer: Self-pay | Admitting: Rehabilitation

## 2011-08-20 ENCOUNTER — Encounter: Payer: Self-pay | Admitting: Rehabilitation

## 2011-08-25 ENCOUNTER — Encounter: Payer: Self-pay | Admitting: Physical Medicine & Rehabilitation

## 2011-08-25 ENCOUNTER — Encounter: Payer: Self-pay | Attending: Physical Medicine and Rehabilitation

## 2011-08-25 DIAGNOSIS — M79609 Pain in unspecified limb: Secondary | ICD-10-CM | POA: Insufficient documentation

## 2011-08-25 DIAGNOSIS — M545 Low back pain, unspecified: Secondary | ICD-10-CM | POA: Insufficient documentation

## 2011-08-25 DIAGNOSIS — IMO0002 Reserved for concepts with insufficient information to code with codable children: Secondary | ICD-10-CM | POA: Insufficient documentation

## 2011-08-27 ENCOUNTER — Encounter: Payer: Self-pay | Attending: Physical Medicine and Rehabilitation | Admitting: Physical Medicine and Rehabilitation

## 2011-08-27 DIAGNOSIS — Q762 Congenital spondylolisthesis: Secondary | ICD-10-CM | POA: Insufficient documentation

## 2011-08-27 DIAGNOSIS — M5137 Other intervertebral disc degeneration, lumbosacral region: Secondary | ICD-10-CM | POA: Insufficient documentation

## 2011-08-27 DIAGNOSIS — M51379 Other intervertebral disc degeneration, lumbosacral region without mention of lumbar back pain or lower extremity pain: Secondary | ICD-10-CM | POA: Insufficient documentation

## 2011-08-27 DIAGNOSIS — G894 Chronic pain syndrome: Secondary | ICD-10-CM

## 2011-08-27 DIAGNOSIS — M79609 Pain in unspecified limb: Secondary | ICD-10-CM | POA: Insufficient documentation

## 2011-08-27 DIAGNOSIS — M5430 Sciatica, unspecified side: Secondary | ICD-10-CM

## 2011-08-27 DIAGNOSIS — M4310 Spondylolisthesis, site unspecified: Secondary | ICD-10-CM

## 2011-08-27 DIAGNOSIS — M545 Low back pain, unspecified: Secondary | ICD-10-CM | POA: Insufficient documentation

## 2011-08-28 NOTE — Assessment & Plan Note (Signed)
[name] is a pleasant 65 year old woman who has a history of low back pain and left lower extremity pain.  She has known L5-S1 disk bulge which is superimposed, some left foraminal disk protrusion with mild mass effect to the left L5 root.  She also has L4-5 bilateral joint degenerative changes with fluid noted.  She has a anterolisthesis of about 3.2-mm of L4 on L5.  She is back in today for a refill of her pain medication.  She reports overall fair relief with the use of the medications now.  Her average pain is between 3 and 4 on a scale of 10.  Currently, in the clinic it is about a 6 to an 8 on a scale of 10.  This pain is described as aching.  It is located in the posterior hip and radiates anteriorly. She reports it interferes moderately with activity levels.  Pain is worse in the morning.  Sleep is good.  Pain is worse with walking and standing; improves with heat, ice, therapy and medication.  FUNCTIONAL STATUS:  She can walk about an hour.  She is able to climb stairs.  She is driving.  She is employed now.  She is working 15 hours a week.  Her goal is to work about 30 hours a week.  REVIEW OF SYSTEMS:  Denies problems with bowel or bladder.  Denies suicidal ideation.  Admits to some weight gain, but otherwise no other problems.  PAST MEDICAL, SOCIAL AND FAMILY HISTORY:  Unchanged from previous visit.  [facility] include Percocet 7.5/325, 3 times a day, gabapentin 400 mg 4 times a day and 2 at bedtime.  On exam, blood pressure is 162/67, pulse 85, respirations 16 and 97% saturated on room air.  She is a well-developed woman who appears her stated age.  Does not appear in any distress.  She is oriented x3. Speech is clear.  Affect is bright.  She is alert, cooperative and pleasant.  Follows commands without difficulty.  Answers my questions appropriately.  Cranial nerves and coordination are intact.  Reflexes are 1+ at the patellar  tendons and Achilles tendon.  No abnormal tone, clonus or tremors are noted.  Straight leg raise is negative.  Motor strength is good in both lower extremities without obvious focal deficit.  Internal and external rotation at the hips does not aggravate pain.  Transitioning from sitting to standing is done without difficulty.  She reports some discomfort with forward flexion at end range.  Extension does not bother her much at this time.  Her gait is non-antalgic.  Her balance is within normal limits.  IMPRESSION: 1. Lumbago with multilevel lumbar degenerative disk disease, L4-5     grade 1 anterolisthesis 2. Left lower extremity neuropathic pain.  Overall, improved.  Pain is     centralized to the posterior hip and radiates somewhat anteriorly.     Overall, pain scores have improved significantly.  PLAN:  We will continue current medications.  She seems to be doing well on this.  She has returned back to work.  We will write a prescription today to refill her Percocet 7.5/325, one p.o. t.i.d. p.r.n. back pain #90.  She does not need a refill on her gabapentin.  I have answered all her questions.  She is comfortable with the current plan.  We will see her back in a month and continue to monitor the use of narcotic pain medications with her.  Her last urine drug screen  was June 06, 2011, and was consistent.  No evidence of aberrant behavior is appreciated.     Brantley Stage, M.D. Electronically Signed    DMK/MedQ D:  08/27/2011 13:41:34  T:  08/28/2011 06:32:02  Job #:  [id number]

## 2011-09-04 ENCOUNTER — Encounter: Payer: Self-pay | Admitting: Physical Medicine & Rehabilitation

## 2011-09-08 ENCOUNTER — Ambulatory Visit: Payer: Self-pay | Admitting: Rehabilitation

## 2011-09-11 ENCOUNTER — Encounter: Payer: Self-pay | Admitting: Rehabilitation

## 2011-09-11 ENCOUNTER — Ambulatory Visit: Payer: Self-pay | Attending: Physical Medicine and Rehabilitation | Admitting: Rehabilitation

## 2011-09-11 DIAGNOSIS — M545 Low back pain, unspecified: Secondary | ICD-10-CM | POA: Insufficient documentation

## 2011-09-11 DIAGNOSIS — R293 Abnormal posture: Secondary | ICD-10-CM | POA: Insufficient documentation

## 2011-09-11 DIAGNOSIS — IMO0001 Reserved for inherently not codable concepts without codable children: Secondary | ICD-10-CM | POA: Insufficient documentation

## 2011-09-15 ENCOUNTER — Ambulatory Visit: Payer: Self-pay | Admitting: Physical Medicine and Rehabilitation

## 2011-09-16 ENCOUNTER — Ambulatory Visit: Payer: Self-pay | Admitting: Rehabilitation

## 2011-09-18 ENCOUNTER — Encounter: Payer: Self-pay | Admitting: Rehabilitation

## 2011-09-22 ENCOUNTER — Encounter: Payer: Self-pay | Admitting: Physical Medicine & Rehabilitation

## 2011-09-22 ENCOUNTER — Encounter: Payer: Self-pay | Attending: Physical Medicine and Rehabilitation

## 2011-09-22 DIAGNOSIS — IMO0002 Reserved for concepts with insufficient information to code with codable children: Secondary | ICD-10-CM | POA: Insufficient documentation

## 2011-09-22 DIAGNOSIS — M79609 Pain in unspecified limb: Secondary | ICD-10-CM | POA: Insufficient documentation

## 2011-09-22 DIAGNOSIS — M545 Low back pain, unspecified: Secondary | ICD-10-CM | POA: Insufficient documentation

## 2011-09-22 NOTE — Procedures (Signed)
[name], [name]               ACCOUNT NO.:  [id number]  MEDICAL RECORD NO.:  [id number]           PATIENT TYPE:  O  LOCATION:  [facility]                          FACILITY:  [facility]  PHYSICIAN:  Erick Colace, M.D.DATE OF BIRTH:  [birth year]  DATE OF PROCEDURE: DATE OF DISCHARGE:                              OPERATIVE REPORT  PROCEDURE:  This is a L5-S1 translaminar lumbar epidural steroid injection under fluoroscopic guidance.  INDICATION:  Right lumbosacral radiculitis.  She complains of pain going down her leg to the lateral foot.  MRI demonstrating L5-S1 disk bulge, some mild mass effect on the left L5 nerve root.  Informed consent was obtained after describing risks and benefits of the procedure with patient.  These include bleeding, bruising, and infection.  She elects to proceed and has given written consent.  Her pain is only partially response to medication management, interferes with self-care and mobility.  The patient placed prone on fluoroscopy table.  Betadine prep, sterile drape, proper patient, proper procedure reviewed.  Area marked and prepped with Betadine, sterile prep, 25-gauge inch and half needle was used to anesthetize skin and subcu tissue, 1% lidocaine x2 mL followed by insertion of 17-gauge Tuohy needle.  Once needle tip approximated posterior elements on fluoroscopic images, loss of resistance technique was utilized to gain access to the epidural space 50:50 air saline mix was used.  Positive loss of resistance was confirmed with Omnipaque 180 x2 mL demonstrating good epidural spread. This is followed by an injection of solution containing 2 mL of 40 mg/mL Depo-Medrol and 2 mL of 1% MPF lidocaine.  The patient tolerated procedure well.  Postprocedure instructions given.     Erick Colace, M.D. Electronically Signed    AEK/MEDQ  D:  09/22/2011 16:37:19  T:  09/22/2011 20:57:31  Job:  [id number]

## 2011-09-23 ENCOUNTER — Encounter: Payer: Self-pay | Admitting: Rehabilitation

## 2011-09-23 ENCOUNTER — Ambulatory Visit: Payer: Self-pay | Admitting: Rehabilitation

## 2011-09-25 ENCOUNTER — Encounter: Payer: Self-pay | Admitting: Rehabilitation

## 2011-09-30 ENCOUNTER — Ambulatory Visit: Payer: Self-pay | Admitting: Rehabilitation

## 2011-10-01 ENCOUNTER — Encounter: Payer: Self-pay | Attending: Physical Medicine and Rehabilitation | Admitting: Physical Medicine and Rehabilitation

## 2011-10-01 DIAGNOSIS — M79609 Pain in unspecified limb: Secondary | ICD-10-CM | POA: Insufficient documentation

## 2011-10-01 DIAGNOSIS — M545 Low back pain, unspecified: Secondary | ICD-10-CM

## 2011-10-01 DIAGNOSIS — G5790 Unspecified mononeuropathy of unspecified lower limb: Secondary | ICD-10-CM | POA: Insufficient documentation

## 2011-10-01 DIAGNOSIS — M5137 Other intervertebral disc degeneration, lumbosacral region: Secondary | ICD-10-CM | POA: Insufficient documentation

## 2011-10-01 DIAGNOSIS — M51379 Other intervertebral disc degeneration, lumbosacral region without mention of lumbar back pain or lower extremity pain: Secondary | ICD-10-CM | POA: Insufficient documentation

## 2011-10-02 NOTE — Assessment & Plan Note (Signed)
[name] is a pleasant 66 year old woman who has a history of low back pain and left lower extremity pain.  She is back in today for refill of her pain medications and a brief recheck.  She underwent an L5-S1 translaminar epidural steroid injection per Dr. Wynn Banker on September 22, 2011.  She is back in today and reports overall improvement in her low back and leg pain.  She reports good relief with current meds.  Her average pain today is about is 0; however, she does have days that it is up to about a 3 or a 4.  Overall, she has been doing well.  She reports good sleep at night. Pain is worse with walking and standing, and improves with rest, exercise and medication.  FUNCTIONAL STATUS:  She is able to walk about 2 hours if she is taking medication.  She is able to climb stairs and drive.  She is independent with self-care.  She is working 14 hours a week.  No change regarding review of systems, past medical and social as well.  FAMILY HISTORY:  Unchanged.  PHYSICAL EXAMINATION:  Today, blood pressure is 147/99, request follow up with primary care for elevated blood pressure.  She states she will comply.  Pulse is 82, respirations 16, 96% saturated on room air.  She reports no problems with chest pain, visual changes, or headache.  She is well-developed and mildly obese woman who does not appear in any distress.  She is 217 pounds and 64 inches tall.  She is oriented x3. Her speech is clear.  Affect is bright.  She is alert, cooperative, and pleasant.  Follows commands without difficulty.  Answers my questions appropriately.  Cranial nerves and coordination are intact.  Reflexes are 1+ at the patellar and Achilles tendons bilaterally.  No abnormal tone, clonus, or tremors are noted.  Her motor strength is 5/5 at hip flexors, knee extensors, dorsiflexors, plantar flexors without focal deficit.  Sensory exam intact in the lower extremities.  Transitioning from sitting to  standing is done without difficulty.  Gait is not antalgic and normal.  She has no pain with flexion, extension of lumbar spine today.  She has some limitations in motion, however.  IMPRESSION: 1. Lumbago with multilevel lumbar degenerative disk disease, L4-5     grade 1 anterolisthesis. 2. Left lower extremity neuropathic pain, overall improved.  PLAN:  We will refill Percocet 7.5/325 1 p.o. t.i.d. p.r.n. back or leg pain.  Her pill counts are appropriate.  She has 25 pills left.  We will continue to monitor the use of her pain medication.  She continues to use gabapentin and finds it to be helpful as well.  Her last urine drug screen was done on June 06, 2011, and was consistent.  She is currently comfortable with our management plan.  I have answered all her questions.  I will see her back in a month.     Brantley Stage, M.D.    DMK/MedQ D:  10/01/2011 13:30:44  T:  10/02/2011 03:27:10  Job #:  [id number]

## 2011-11-03 ENCOUNTER — Ambulatory Visit: Payer: Self-pay | Admitting: Physical Medicine and Rehabilitation

## 2011-12-22 ENCOUNTER — Encounter: Payer: Self-pay | Admitting: Family Medicine

## 2011-12-22 ENCOUNTER — Ambulatory Visit (INDEPENDENT_AMBULATORY_CARE_PROVIDER_SITE_OTHER): Payer: Self-pay | Admitting: Family Medicine

## 2011-12-22 VITALS — BP 140/90 | HR 76 | Temp 97.9°F | Wt 210.0 lb

## 2011-12-22 DIAGNOSIS — I10 Essential (primary) hypertension: Secondary | ICD-10-CM

## 2011-12-22 DIAGNOSIS — M5430 Sciatica, unspecified side: Secondary | ICD-10-CM

## 2011-12-22 MED ORDER — LISINOPRIL-HYDROCHLOROTHIAZIDE 10-12.5 MG PO TABS: 1.0000 | ORAL_TABLET | Freq: Every day | ORAL | Status: DC

## 2011-12-22 NOTE — Progress Notes (Signed)
Subjective:    Patient ID: [name], female    DOB: 01/04/1946, 66 y.o.   MRN: [id number]  HPI  66 yo here for follow up.  Was told she needed f/u before we would refill her BP medication.  HTN- taking Lisinopril-HCTZ 10-12.5 mg daily. BP has been a little elevated lately, has been weaning herself off of narcotics because she felt she "was getting hooked on them." Has home BP cuff. No CP, SOB, blurred vision, or LE edema.  Has known h/o L4-L5 spinal stenosis, left sided radiculopathy. Was being followed by [facility].  Stopped going because pt did not want to take prednisone regularly. Has also been seeing her chiropractor regularly which was not helping.   [facility]-  Feels pain has improved with Gabapentin and weaning herself off of hydrocodone.    Patient Active Problem List  Diagnoses  . DERMATOPHYTOSIS OF NAIL  . HYPERTENSION  . HIRSUTISM  . Sciatica neuralgia  . Back pain  . Adrenal adenoma   Past Medical History  Diagnosis Date  . Hypertension    No past surgical history on file. History  Substance Use Topics  . Smoking status: Never Smoker   . Smokeless tobacco: Not on file  . Alcohol Use: No   Family History  Problem Relation Age of Onset  . Hypertension Other   . Cancer Other     ovarian   No Known Allergies Current Outpatient Prescriptions on File Prior to Visit  Medication Sig Dispense Refill  . B Complex-C (SUPER B COMPLEX PO) Take 1 tablet by mouth daily.        . Calcium Carbonate-Vitamin D (CALCIUM-VITAMIN D) 600-200 MG-UNIT CAPS Take 1 capsule by mouth daily.        . Cholecalciferol (VITAMIN D-3) 5000 UNITS TABS Take 1 tablet by mouth daily.        . fish oil-omega-3 fatty acids 1000 MG capsule Take 2 g by mouth daily.      Marland Kitchen gabapentin (NEURONTIN) 300 MG capsule Take 300 mg by mouth 4 (four) times daily.      Marland Kitchen HYDROcodone-acetaminophen (NORCO) 5-325 MG per tablet Take 1 tablet by mouth every 6 (six) hours as  needed.        Marland Kitchen lisinopril-hydrochlorothiazide (PRINZIDE,ZESTORETIC) 10-12.5 MG per tablet Take 1 tablet by mouth daily.      . Multiple Vitamins-Minerals (CENTRUM SILVER PO) Take 1 tablet by mouth daily.        . naproxen (NAPROSYN) 500 MG tablet Take 500 mg by mouth 2 (two) times daily with a meal.      . NON FORMULARY Memoral (take two tablet by mouth daily)      . oxyCODONE-acetaminophen (PERCOCET) 10-325 MG per tablet Take 1 tablet by mouth 3 (three) times daily.      Marland Kitchen oxyCODONE-acetaminophen (PERCOCET) 7.5-325 MG per tablet Take 1 tablet by mouth every 4 (four) hours as needed.       The PMH, PSH, Social History, Family History, Medications, and allergies have been reviewed in [facility], and have been updated if relevant.   Review of Systems    See HPI Objective:   Physical Exam BP 140/90  Pulse 76  Temp(Src) 97.9 F (36.6 C) (Oral)  Wt 210 lb (95.255 kg) Gen:  Alert, pleasant, NAD. Resp:  CTA bilaterally CVS:  RRR Psych:  Anxious appearing.      Assessment & Plan:   1. HYPERTENSION  Deteriorated but she is keeping a close  watch on her readings. She will call me with an update. Continue current dose of lisinopril- HCTZ.  2. Sciatica neuralgia  Improved.

## 2011-12-24 ENCOUNTER — Encounter: Payer: Self-pay | Admitting: Physical Medicine and Rehabilitation

## 2011-12-25 ENCOUNTER — Other Ambulatory Visit: Payer: Self-pay | Admitting: Family Medicine

## 2012-01-09 ENCOUNTER — Telehealth: Payer: Self-pay | Admitting: Physical Medicine and Rehabilitation

## 2012-01-09 MED ORDER — GABAPENTIN 300 MG PO CAPS: 300.0000 mg | ORAL_CAPSULE | Freq: Four times a day (QID) | ORAL | Status: DC

## 2012-01-09 NOTE — Telephone Encounter (Signed)
Refill on Gabapentin

## 2012-02-03 ENCOUNTER — Telehealth: Payer: Self-pay | Admitting: Physical Medicine and Rehabilitation

## 2012-02-03 NOTE — Telephone Encounter (Signed)
Pt is to call her pharmacy. Last time Dr. Pamelia Hoit filled for her she gave her 3 refills.

## 2012-02-03 NOTE — Telephone Encounter (Signed)
Refill on Gabapentin

## 2012-02-09 ENCOUNTER — Telehealth: Payer: Self-pay | Admitting: *Deleted

## 2012-02-09 NOTE — Telephone Encounter (Signed)
Wants a refill on her medications. Doesn't want to come in for an appointment, states that she is fine and nothing has changed.

## 2012-02-09 NOTE — Telephone Encounter (Signed)
Pt is needing a refill on Gabapentin. She is aware that she needs to contact her pharmacy because the last time it was sent in she was given 3 additional refills.

## 2012-02-12 ENCOUNTER — Telehealth: Payer: Self-pay | Admitting: *Deleted

## 2012-02-12 NOTE — Telephone Encounter (Signed)
Rx has been clarified with pateint

## 2012-02-12 NOTE — Telephone Encounter (Signed)
Confused about gabapentin rx, please call.

## 2012-02-27 ENCOUNTER — Telehealth: Payer: Self-pay

## 2012-02-27 NOTE — Telephone Encounter (Signed)
Pt wanted Dr Dayton Martes to know a friend had MRSA; applied salt to area and area healed quickly.Pt thinks this is cure for MRSA and request Dr Elmer Sow opinion.Please advise.

## 2012-02-27 NOTE — Telephone Encounter (Signed)
Advised pt.  She says she knows what happened for her friend and that you may want to try using this therapy in the future.

## 2012-02-27 NOTE — Telephone Encounter (Signed)
No this is not a cure for MRSA. Saline solution is something we use to clean wounds but this is not a "cure."

## 2012-03-12 ENCOUNTER — Encounter: Payer: Self-pay | Admitting: Physical Medicine and Rehabilitation

## 2012-03-12 ENCOUNTER — Encounter: Payer: Self-pay | Attending: Physical Medicine and Rehabilitation | Admitting: Physical Medicine and Rehabilitation

## 2012-03-12 ENCOUNTER — Telehealth: Payer: Self-pay | Admitting: Physical Medicine and Rehabilitation

## 2012-03-12 VITALS — BP 159/89 | HR 66 | Resp 16 | Ht 64.0 in | Wt 213.0 lb

## 2012-03-12 DIAGNOSIS — M51379 Other intervertebral disc degeneration, lumbosacral region without mention of lumbar back pain or lower extremity pain: Secondary | ICD-10-CM | POA: Insufficient documentation

## 2012-03-12 DIAGNOSIS — M79609 Pain in unspecified limb: Secondary | ICD-10-CM | POA: Insufficient documentation

## 2012-03-12 DIAGNOSIS — M5137 Other intervertebral disc degeneration, lumbosacral region: Secondary | ICD-10-CM | POA: Insufficient documentation

## 2012-03-12 DIAGNOSIS — M549 Dorsalgia, unspecified: Secondary | ICD-10-CM

## 2012-03-12 DIAGNOSIS — M545 Low back pain, unspecified: Secondary | ICD-10-CM | POA: Insufficient documentation

## 2012-03-12 MED ORDER — GABAPENTIN 400 MG PO CAPS: 400.0000 mg | ORAL_CAPSULE | Freq: Every day | ORAL | Status: DC

## 2012-03-12 NOTE — Patient Instructions (Addendum)
I have ordered gabapentin 400 mg 5 times a day for you.   you have 6 refills.  Please call me in 6 months and let me know how you're doing and I can call in another prescription for you.  Please maintain contact with your primary care Dr.  I will see you back in a year

## 2012-03-12 NOTE — Telephone Encounter (Signed)
Needs directions and qty clarification

## 2012-03-12 NOTE — Progress Notes (Signed)
Subjective:    Patient ID: [name], female    DOB: 1946/02/21, 66 y.o.   MRN: [id number]  HPI [name] is a pleasant 66 year old woman who has a history of low  back pain and left lower extremity pain. [name] is back in today for  refill of her pain medications and a brief recheck.  [name] underwent an L5-S1 translaminar epidural steroid injection per Dr.  Wynn Banker on September 22, 2011. [name] is back in today and reports overall  improvement in her low back and leg pain. [name] reports good relief with  current meds.  Back in November/early December [name] had been taking gabapentin 400 mg 5 times per day. When [name] called in last month [name] was placed back on her previous dose of 300 mg 4 times a day. [name] noted with the reduction of her gabapentin her pain had returned and [name] began taking Excedrin between doses of gabapentin to help manage her pain.  [name] is requesting to be placed back on 400 mg of gabapentin 5 times per day. [name] has done well on this dose and had not had any problems with over sedation or leg swelling. Her pain scores were much better on this dose as well.    Pain Inventory Average Pain 7 Pain Right Now 2 My pain is burning and aching  In the last 24 hours, has pain interfered with the following? General activity 6 Relation with others 6 Enjoyment of life 6 What TIME of day is your pain at its worst? All Day Sleep (in general) Good  Pain is worse with: standing Pain improves with: medication Relief from Meds: 5  Mobility walk without assistance use a cane how many minutes can you walk? 90 ability to climb steps?  yes do you drive?  yes  Function employed # of hrs/week 20  Neuro/Psych weakness numbness tingling trouble walking spasms  Prior Studies Any changes since last visit?  no  Physicians involved in your care Any changes since last visit?  no   Family History  Problem Relation Age of Onset  . Hypertension Other   . Cancer Other    ovarian  . Cancer Mother   . Heart disease Father    History   Social History  . Marital Status: Single    Spouse Name: N/A    Number of Children: N/A  . Years of Education: N/A   Social History Main Topics  . Smoking status: Never Smoker   . Smokeless tobacco: None  . Alcohol Use: No  . Drug Use: No  . Sexually Active: None   Other Topics Concern  . None   Social History Narrative  . None   Past Surgical History  Procedure Date  . Cesarean section    Past Medical History  Diagnosis Date  . Hypertension    BP 159/89  Pulse 66  Resp 16  Ht 5\' 4"  (1.626 m)  Wt 213 lb (96.616 kg)  BMI 36.56 kg/m2  SpO2 96%     Review of Systems  HENT: Negative.   Eyes: Negative.   Respiratory: Negative.   Cardiovascular: Positive for leg swelling.  Gastrointestinal: Negative.   Genitourinary: Negative.   Musculoskeletal: Positive for gait problem.  Skin: Negative.   Neurological: Positive for weakness.       Tingling and Spasms  Hematological: Negative.   Psychiatric/Behavioral: Negative.        Objective:   Physical Exam  [name] is oriented x3.  Her speech is clear.  Affect is bright. [name] is alert, cooperative, and  pleasant. Follows commands without difficulty. Answers my questions  appropriately. Cranial nerves and coordination are intact. Reflexes  are 1+ at the patellar and Achilles tendons bilaterally. No abnormal  tone, clonus, or tremors are noted. Her motor strength is 5/5 at hip  flexors, knee extensors, dorsiflexors, plantar flexors without focal  deficit. Sensory exam intact in the lower extremities.  Transitioning from sitting to standing is done without difficulty. Gait  is not antalgic and normal. [name] has no pain with flexion, extension of  lumbar spine today. [name] has some limitations in motion, however.       Assessment & Plan:  1. Lumbago with multilevel lumbar degenerative disk disease, L4-5  grade 1 anterolisthesis.  2. Left lower extremity  neuropathic pain, overall improved.   I have increased your gabapentin from 300 mg 4 times a day to 400 mg 5 times a day  I will see her back in one year. [name] will call in 6 months for refill of her gabapentin and let me know how [name]'s doing.

## 2012-03-19 ENCOUNTER — Telehealth: Payer: Self-pay

## 2012-03-19 NOTE — Telephone Encounter (Signed)
Yes I would like to repeat her pap.

## 2012-03-19 NOTE — Telephone Encounter (Signed)
Pt wants to know if needs pap smear. Pt said having no problems as far as she knows; no vaginal discharge or pain. Pt last pap 02/25/10 by Dr Tretha Sciara OB G[facility]; pt said she went to him because she was having a problem but does not remember what problem was. Copy of sheet from Dr Tiburcio Pea with irregular pap test on it in Dr Elmer Sow box(copied from [employer]Please advise.

## 2012-03-19 NOTE — Telephone Encounter (Signed)
Advised patient.  Offered to schedule appt but she said she would call back.

## 2012-04-01 ENCOUNTER — Ambulatory Visit (INDEPENDENT_AMBULATORY_CARE_PROVIDER_SITE_OTHER): Payer: Self-pay | Admitting: Family Medicine

## 2012-04-01 ENCOUNTER — Other Ambulatory Visit: Payer: Self-pay | Admitting: Family Medicine

## 2012-04-01 ENCOUNTER — Encounter: Payer: Self-pay | Admitting: Family Medicine

## 2012-04-01 VITALS — BP 152/90 | HR 64 | Temp 98.0°F | Wt 209.0 lb

## 2012-04-01 DIAGNOSIS — R351 Nocturia: Secondary | ICD-10-CM

## 2012-04-01 DIAGNOSIS — Z0000 Encounter for general adult medical examination without abnormal findings: Secondary | ICD-10-CM

## 2012-04-01 DIAGNOSIS — Z136 Encounter for screening for cardiovascular disorders: Secondary | ICD-10-CM

## 2012-04-01 DIAGNOSIS — I10 Essential (primary) hypertension: Secondary | ICD-10-CM

## 2012-04-01 DIAGNOSIS — M791 Myalgia, unspecified site: Secondary | ICD-10-CM | POA: Insufficient documentation

## 2012-04-01 DIAGNOSIS — R079 Chest pain, unspecified: Secondary | ICD-10-CM

## 2012-04-01 DIAGNOSIS — M5430 Sciatica, unspecified side: Secondary | ICD-10-CM

## 2012-04-01 DIAGNOSIS — IMO0001 Reserved for inherently not codable concepts without codable children: Secondary | ICD-10-CM

## 2012-04-01 LAB — COMPREHENSIVE METABOLIC PANEL
ALT: 20 U/L (ref 0–35)
AST: 19 U/L (ref 0–37)
Alkaline Phosphatase: 51 U/L (ref 39–117)
CO2: 28 mEq/L (ref 19–32)
Creatinine, Ser: 0.8 mg/dL (ref 0.4–1.2)
GFR: 73.58 mL/min (ref >60.00)
Sodium: 139 mEq/L (ref 135–145)
Total Bilirubin: 0.5 mg/dL (ref 0.3–1.2)
Total Protein: 7.3 g/dL (ref 6.0–8.3)

## 2012-04-01 NOTE — Patient Instructions (Addendum)
Nice to see you, [name]. We are getting some blood work today and will call you with the results (hopefully tomorrow). It seems like your pain is your neuropathy related to your stenosis.  Your EKG was reassuring that you are not having a heart attack. Try over the counter gas-x (four times a day when necessary) or beano for bloating.   pepcid 20 mg daily for next 2 weeks. Exclude gas producing foods (beans, onions, celery, carrots, raisins, bananas, apricots, prunes, brussel sprouts, wheat germ, pretzels)   If your pain does not improve or worsens, please let us know immediately.

## 2012-04-01 NOTE — Progress Notes (Signed)
Subjective:    Patient ID: [name], female    DOB: 1945/09/20, 66 y.o.   MRN: [id number]  HPI  66 yo here for acute visit with multiple complaints.   CC states wants to discuss weight but pt would like to discus other issues.   Has known h/o L4-L5 spinal stenosis, left sided radiculopathy. Was being followed by [facility].  Stopped going because pt did not want to take prednisone regularly. Has also been seeing her chiropractor regularly which was not helping.   [facility] to [facility]-  Feels pain has improved with Gabapentin but now having pain into her feet and ankles too. Sometimes having leg cramps.  Discussed with Pain doctor but states that she was never given a reason for this pain. Gabapentin does seem to help with this pain as well.  Nocturia- past year increased urination at night.  No dysuria. No urinary incontinence.  Not getting acutely worse.  CP- has had two or three episodes of CP while sitting/resting- once a her desk, another time in bed. Does not radiate. No h/o CAD. Pt is a non smoker. No associated nausea or vomiting. Pain resolves on its own.    Patient Active Problem List  Diagnosis  . DERMATOPHYTOSIS OF NAIL  . HYPERTENSION  . HIRSUTISM  . Sciatica neuralgia  . Back pain  . Adrenal adenoma   Past Medical History  Diagnosis Date  . Hypertension    Past Surgical History  Procedure Date  . Cesarean section    History  Substance Use Topics  . Smoking status: Never Smoker   . Smokeless tobacco: Not on file  . Alcohol Use: No   Family History  Problem Relation Age of Onset  . Hypertension Other   . Cancer Other     ovarian  . Cancer Mother   . Heart disease Father    No Known Allergies Current Outpatient Prescriptions on File Prior to Visit  Medication Sig Dispense Refill  . B Complex-C (SUPER B COMPLEX PO) Take 1 tablet by mouth daily.        . Calcium Carbonate-Vitamin D (CALCIUM-VITAMIN D) 600-200 MG-UNIT CAPS  Take 1 capsule by mouth daily.        . Cholecalciferol (VITAMIN D-3) 5000 UNITS TABS Take 1 tablet by mouth daily.        Marland Kitchen gabapentin (NEURONTIN) 400 MG capsule Take 1 capsule (400 mg total) by mouth 5 (five) times daily.  1 capsule  6  . lisinopril-hydrochlorothiazide (PRINZIDE,ZESTORETIC) 10-12.5 MG per tablet TAKE ONE TABLET BY MOUTH EVERY DAY  30 tablet  11  . Multiple Vitamins-Minerals (CENTRUM SILVER PO) Take 1 tablet by mouth daily.        . NON FORMULARY Memoral (take two tablet by mouth daily)       The PMH, PSH, Social History, Family History, Medications, and allergies have been reviewed in [facility], and have been updated if relevant.   Review of Systems    See HPI No dizziness No SOB No LE edema No dysuria Objective:   Physical Exam BP 152/90  Pulse 64  Temp 98 F (36.7 C)  Wt 209 lb (94.802 kg)  General:  Well-developed,well-nourished,in no acute distress; alert,appropriate and cooperative throughout examination Head:  normocephalic and atraumatic.   Eyes:  vision grossly intact, pupils equal, pupils round, and pupils reactive to light.   Ears:  R ear normal and L ear normal.   Nose:  no external deformity.   Mouth:  good dentition.   Lungs:  Normal respiratory effort, chest expands symmetrically. Lungs are clear to auscultation, no crackles or wheezes. Heart:  Normal rate and regular rhythm. S1 and S2 normal without gallop, murmur, click, rub or other extra sounds. Msk:  No deformity or scoliosis noted of thoracic or lumbar spine.   Extremities:  Normal strength and sensation in all extremities. Neurologic:  alert & oriented X3 and gait normal.   Skin:  Intact without suspicious lesions or rashes Psych:  Cognition and judgment appear intact. Alert and cooperative with normal attention span and concentration. No apparent delusions, illusions, hallucinations      Assessment & Plan:   1. Chest pain  Does not seem cardiac. EKG reassuring- mild bradycardia  (asymptomatic) with occasional ectopic beats and LVH.   If symptoms return, refer to cards but at this point, likely GERD. See pt instructions for supportive care. EKG 12-Lead  2. Nocturia  Likely due to combination of body habitus and diuretic use.  Ongoing for over a year and not worsening.  Advised not drinking before bed and continuing to work on diet.   3. Muscle pain  Likely due to her sciatica neuralgia but will check electrolytes to rule out other reversible factors. Comprehensive metabolic panel  4. Sciatica neuralgia  Followed by pain clinic.  Comprehensive metabolic panel

## 2012-04-07 ENCOUNTER — Other Ambulatory Visit (INDEPENDENT_AMBULATORY_CARE_PROVIDER_SITE_OTHER): Payer: Self-pay

## 2012-04-07 DIAGNOSIS — Z0000 Encounter for general adult medical examination without abnormal findings: Secondary | ICD-10-CM

## 2012-04-07 DIAGNOSIS — Z136 Encounter for screening for cardiovascular disorders: Secondary | ICD-10-CM

## 2012-04-07 DIAGNOSIS — I10 Essential (primary) hypertension: Secondary | ICD-10-CM

## 2012-04-07 LAB — LIPID PANEL
Cholesterol: 171 mg/dL (ref 0–200)
HDL: 67.9 mg/dL (ref >39.00)

## 2012-04-07 LAB — CBC WITH DIFFERENTIAL/PLATELET
Basophils Relative: 0.4 % (ref 0.0–3.0)
Eosinophils Absolute: 0.1 10*3/uL (ref 0.0–0.7)
Eosinophils Relative: 0.9 % (ref 0.0–5.0)
HCT: 44 % (ref 36.0–46.0)
Lymphs Abs: 2 10*3/uL (ref 0.7–4.0)
MCHC: 33.7 g/dL (ref 30.0–36.0)
MCV: 97.6 fl (ref 78.0–100.0)
Monocytes Absolute: 0.6 10*3/uL (ref 0.1–1.0)
Platelets: 249 10*3/uL (ref 150.0–400.0)
RBC: 4.51 Mil/uL (ref 3.87–5.11)
WBC: 8.5 10*3/uL (ref 4.5–10.5)

## 2012-04-07 LAB — COMPREHENSIVE METABOLIC PANEL
ALT: 20 U/L (ref 0–35)
CO2: 28 mEq/L (ref 19–32)
Calcium: 9.1 mg/dL (ref 8.4–10.5)
Chloride: 105 mEq/L (ref 96–112)
Creatinine, Ser: 0.7 mg/dL (ref 0.4–1.2)
GFR: 84 mL/min (ref >60.00)
Glucose, Bld: 87 mg/dL (ref 70–99)
Total Bilirubin: 0.6 mg/dL (ref 0.3–1.2)

## 2012-04-13 ENCOUNTER — Other Ambulatory Visit (HOSPITAL_COMMUNITY)
Admission: RE | Admit: 2012-04-13 | Discharge: 2012-04-13 | Disposition: A | Discharge status: Home / Self Care | Payer: Self-pay | Source: Ambulatory Visit | Attending: Family Medicine | Admitting: Family Medicine

## 2012-04-13 ENCOUNTER — Ambulatory Visit (INDEPENDENT_AMBULATORY_CARE_PROVIDER_SITE_OTHER): Payer: Self-pay | Admitting: Family Medicine

## 2012-04-13 VITALS — BP 148/90 | Temp 97.9°F | Ht 63.0 in | Wt 211.0 lb

## 2012-04-13 DIAGNOSIS — Z0000 Encounter for general adult medical examination without abnormal findings: Secondary | ICD-10-CM | POA: Insufficient documentation

## 2012-04-13 DIAGNOSIS — I10 Essential (primary) hypertension: Secondary | ICD-10-CM

## 2012-04-13 DIAGNOSIS — M5430 Sciatica, unspecified side: Secondary | ICD-10-CM

## 2012-04-13 DIAGNOSIS — Z1151 Encounter for screening for human papillomavirus (HPV): Secondary | ICD-10-CM | POA: Insufficient documentation

## 2012-04-13 DIAGNOSIS — Z01419 Encounter for gynecological examination (general) (routine) without abnormal findings: Secondary | ICD-10-CM | POA: Insufficient documentation

## 2012-04-13 NOTE — Progress Notes (Signed)
Subjective:    Patient ID: [name], female    DOB: 1946/02/23, 66 y.o.   MRN: [id number]  HPI  66 yo G4P4 here for CPX. H/o abnormal pap smear in 2011.  Denies any vaginal discharge or dysuria. She does have urinary frequency but has had this issue for years.  Refuses to have more mammograms.  UTD on all other prevention.  HTN- blood pressure well controlled on Prinzide.   Has known h/o L4-L5 spinal stenosis, left sided radiculopathy. Was being followed by [facility].  Stopped going because pt did not want to take prednisone regularly. Has also been seeing her chiropractor regularly which was not helping.   Now going to [facility]-  Feels pain has improved with Gabapentin but now having pain into her feet and ankles too. Gabapentin does seem to help with this pain as well.  Lab Results  Component Value Date   CHOL 171 04/07/2012   HDL 67.90 04/07/2012   LDLCALC 82 04/07/2012   TRIG 104.0 04/07/2012   CHOLHDL 3 04/07/2012     Patient Active Problem List  Diagnosis  . DERMATOPHYTOSIS OF NAIL  . HYPERTENSION  . HIRSUTISM  . Sciatica neuralgia  . Back pain  . Adrenal adenoma  . Chest pain  . Nocturia  . Muscle pain  . Routine general medical examination at a health care facility   Past Medical History  Diagnosis Date  . Hypertension    Past Surgical History  Procedure Date  . Cesarean section    History  Substance Use Topics  . Smoking status: Never Smoker   . Smokeless tobacco: Not on file  . Alcohol Use: No   Family History  Problem Relation Age of Onset  . Hypertension Other   . Cancer Other     ovarian  . Cancer Mother   . Heart disease Father    No Known Allergies Current Outpatient Prescriptions on File Prior to Visit  Medication Sig Dispense Refill  . B Complex-C (SUPER B COMPLEX PO) Take 1 tablet by mouth daily.        . Calcium Carbonate-Vitamin D (CALCIUM-VITAMIN D) 600-200 MG-UNIT CAPS Take 1 capsule by mouth daily.          . Cholecalciferol (VITAMIN D-3) 5000 UNITS TABS Take 1 tablet by mouth daily.        Marland Kitchen gabapentin (NEURONTIN) 300 MG capsule Take 300 mg by mouth 3 (three) times daily.      Marland Kitchen gabapentin (NEURONTIN) 400 MG capsule Take 1 capsule (400 mg total) by mouth 5 (five) times daily.  1 capsule  6  . lisinopril-hydrochlorothiazide (PRINZIDE,ZESTORETIC) 10-12.5 MG per tablet TAKE ONE TABLET BY MOUTH EVERY DAY  30 tablet  11  . Multiple Vitamins-Minerals (CENTRUM SILVER PO) Take 1 tablet by mouth daily.        . NON FORMULARY Memoral (take two tablet by mouth daily)       The PMH, PSH, Social History, Family History, Medications, and allergies have been reviewed in [facility], and have been updated if relevant.   Review of Systems    See HPI  Objective:   Physical Exam BP 148/90  Temp 97.9 F (36.6 C)  Ht 5\' 3"  (1.6 m)  Wt 211 lb (95.709 kg)  BMI 37.38 kg/m2  General:  Well-developed,well-nourished,in no acute distress; alert,appropriate and cooperative throughout examination Head:  normocephalic and atraumatic.   Eyes:  vision grossly intact, pupils equal, pupils round, and pupils reactive to light.  Ears:  R ear normal and L ear normal.   Nose:  no external deformity.   Mouth:  good dentition.   Breasts:  No mass, nodules, thickening, tenderness, bulging, retraction, inflamation, nipple discharge or skin changes noted.   Lungs:  Normal respiratory effort, chest expands symmetrically. Lungs are clear to auscultation, no crackles or wheezes. Heart:  Normal rate and regular rhythm. S1 and S2 normal without gallop, murmur, click, rub or other extra sounds. Msk:  No deformity or scoliosis noted of thoracic or lumbar spine.   Extremities:  Normal strength and sensation in all extremities. Neurologic:  alert & oriented X3 and gait normal.   Skin:  Intact without suspicious lesions or rashes Psych:  Cognition and judgment appear intact. Alert and cooperative with normal attention span and  concentration. No apparent delusions, illusions, hallucinations Rectal:  no external abnormalities.   Genitalia:  Pelvic Exam:        External: normal female genitalia without lesions or masses        Vagina: normal without lesions or masses        Cervix: normal without lesions or masses        Adnexa: normal bimanual exam without masses or fullness        Uterus: normal by palpation        Pap smear: performed    Assessment & Plan:   1. Routine general medical examination at a health care facility  Reviewed preventive care protocols, scheduled due services, and updated immunizations Discussed nutrition, exercise, diet, and healthy lifestyle. Pap smear today.   2. HYPERTENSION  Stable.   3. Sciatica neuralgia  On Gabapentin, followed by pain clinic.

## 2012-04-13 NOTE — Patient Instructions (Addendum)

## 2012-04-19 ENCOUNTER — Encounter: Payer: Self-pay | Admitting: *Deleted

## 2012-09-14 ENCOUNTER — Other Ambulatory Visit: Payer: Self-pay

## 2012-09-14 MED ORDER — GABAPENTIN 400 MG PO CAPS: 400.0000 mg | ORAL_CAPSULE | Freq: Every day | ORAL | Status: DC

## 2012-11-15 ENCOUNTER — Ambulatory Visit (INDEPENDENT_AMBULATORY_CARE_PROVIDER_SITE_OTHER): Payer: Self-pay | Admitting: Family Medicine

## 2012-11-15 ENCOUNTER — Telehealth: Payer: Self-pay | Admitting: Family Medicine

## 2012-11-15 ENCOUNTER — Ambulatory Visit (INDEPENDENT_AMBULATORY_CARE_PROVIDER_SITE_OTHER)
Admission: RE | Admit: 2012-11-15 | Discharge: 2012-11-15 | Disposition: A | Discharge status: Home / Self Care | Payer: BC Managed Care – PPO | Source: Ambulatory Visit | Attending: Family Medicine | Admitting: Family Medicine

## 2012-11-15 ENCOUNTER — Encounter: Payer: Self-pay | Admitting: Family Medicine

## 2012-11-15 VITALS — BP 140/92 | HR 85 | Temp 98.4°F | Ht 63.0 in | Wt 217.5 lb

## 2012-11-15 DIAGNOSIS — M25519 Pain in unspecified shoulder: Secondary | ICD-10-CM

## 2012-11-15 DIAGNOSIS — M25512 Pain in left shoulder: Secondary | ICD-10-CM

## 2012-11-15 DIAGNOSIS — S42202A Unspecified fracture of upper end of left humerus, initial encounter for closed fracture: Secondary | ICD-10-CM

## 2012-11-15 DIAGNOSIS — S42293A Other displaced fracture of upper end of unspecified humerus, initial encounter for closed fracture: Secondary | ICD-10-CM

## 2012-11-15 MED ORDER — HYDROCODONE-ACETAMINOPHEN 5-325 MG PO TABS: 1.0000 | ORAL_TABLET | Freq: Four times a day (QID) | ORAL | Status: DC | PRN

## 2012-11-15 MED ORDER — TRAMADOL HCL 50 MG PO TABS: 50.0000 mg | ORAL_TABLET | Freq: Four times a day (QID) | ORAL | Status: DC | PRN

## 2012-11-15 NOTE — Telephone Encounter (Signed)
Patient advised and linda to schedule appt

## 2012-11-15 NOTE — Telephone Encounter (Signed)
She can schedule an appt to see Dr. Patsy Lager this afternoon and we have xray here.

## 2012-11-15 NOTE — Telephone Encounter (Signed)
To: [facility] (After Hours Triage) Fax: [phone] From: [facility] Date/ Time: 11/15/2012 8:18 AM Taken By: Jethro BolusRaleigh Lions Facility: not collected Patient: [name], [name] DOB: May 17, 1946 Phone: [phone] Reason for Call: Pt fell an injured left should and would like a referral to have xrays done. PLEASE F/U WITH PT, THANK YOU.

## 2012-11-15 NOTE — Progress Notes (Addendum)
Nature conservation officer at [facility] [address] [place] [address] Phone: [phone] Fax: [phone]  Date:  11/15/2012   Name:  [name]   DOB:  1945/12/10   MRN:  [id number] Gender: female Age: 67 y.o.  Primary Physician:  Ruthe Mannan, MD  Evaluating MD: Hannah Beat, MD   Chief Complaint: Shoulder Pain   History of Present Illness:  [name] is a 67 y.o. pleasant patient who presents with the following:  Left shoulder, walked down the curb on the arm itself. Landed on point of shoulder 6 days ago. Date of injury is 6 days prior from encounter on November 10, 2011.  No significant history from her shoulder musculoskeletal standpoint. The patient fell on the point of her shoulder and on her midshaft and proximal humerus as she was going down on the curb. She has been unable to lift her arm above 45, and she is having pain and significant bruising throughout the upper arm. She stayed in after the snow and ice over the last few days, and today is her 1st time seeking evaluation. She called in the afternoon and I was asked to evaluate the patient emergently.    Patient Active Problem List  Diagnosis  . DERMATOPHYTOSIS OF NAIL  . HYPERTENSION  . HIRSUTISM  . Sciatica neuralgia  . Back pain  . Adrenal adenoma  . Chest pain  . Nocturia  . Muscle pain  . Routine general medical examination at a health care facility    Past Medical History  Diagnosis Date  . Hypertension     Past Surgical History  Procedure Laterality Date  . Cesarean section      History   Social History  . Marital Status: Single    Spouse Name: N/A    Number of Children: N/A  . Years of Education: N/A   Occupational History  . Not on file.   Social History Main Topics  . Smoking status: Never Smoker   . Smokeless tobacco: Not on file  . Alcohol Use: No  . Drug Use: No  . Sexually Active: Not on file   Other Topics Concern  . Not on file   Social History Narrative   . No narrative on file    Family History  Problem Relation Age of Onset  . Hypertension Other   . Cancer Other     ovarian  . Cancer Mother   . Heart disease Father     No Known Allergies  Medication list has been reviewed and updated.  Outpatient Prescriptions Prior to Visit  Medication Sig Dispense Refill  . B Complex-C (SUPER B COMPLEX PO) Take 1 tablet by mouth daily.        . Calcium Carbonate-Vitamin D (CALCIUM-VITAMIN D) 600-200 MG-UNIT CAPS Take 1 capsule by mouth daily.        . Cholecalciferol (VITAMIN D-3) 5000 UNITS TABS Take 1 tablet by mouth daily.        Marland Kitchen lisinopril-hydrochlorothiazide (PRINZIDE,ZESTORETIC) 10-12.5 MG per tablet TAKE ONE TABLET BY MOUTH EVERY DAY  30 tablet  11  . Multiple Vitamins-Minerals (CENTRUM SILVER PO) Take 1 tablet by mouth daily.        . NON FORMULARY Memoral (take two tablet by mouth daily)      . gabapentin (NEURONTIN) 400 MG capsule Take 1 capsule (400 mg total) by mouth 5 (five) times daily.  1 capsule  6   No facility-administered medications prior to visit.    Review  of Systems:   GEN: No fevers, chills. Nontoxic. Primarily MSK c/o today. MSK: Detailed in the HPI GI: tolerating PO intake without difficulty Neuro: No numbness, parasthesias, or tingling associated. Otherwise the pertinent positives of the ROS are noted above.    Physical Examination: BP 140/92  Pulse 85  Temp(Src) 98.4 F (36.9 C) (Oral)  Ht 5\' 3"  (1.6 m)  Wt 217 lb 8 oz (98.657 kg)  BMI 38.54 kg/m2  SpO2 97%  Ideal Body Weight: Weight in (lb) to have BMI = 25: 140.8   GEN: WDWN, NAD, Non-toxic, Alert & Oriented x 3 HEENT: Atraumatic, Normocephalic.  Ears and Nose: No external deformity. EXTR: No clubbing/cyanosis/edema NEURO: Normal gait.  PSYCH: Normally interactive. Conversant. Not depressed or anxious appearing.  Calm demeanor.   RIGHT shoulder: Nontender throughout all bony anatomy. Full range of motion. Strength is 5/5.  LEFT shoulder:  Nontender along clavicle. Nontender at the a.c. Joint. Full range of motion at the elbow with full flexion, extension, supination, and pronation. Nontender throughout the entirety of the radius and ulna. Nontender throughout the wrist and hand. Patient is notably tender around the mid shaft and proximal humerus. There is some significant bruising in this area.  She is only able to abduct her shoulder approximately 40.  Objective Data: Dg Shoulder Left  11/15/2012  *RADIOLOGY REPORT*  Clinical Data: Proximal left humerus fracture.  LEFT SHOULDER - 2+ VIEW  Comparison: None.  Findings: There is a nondisplaced fracture through the left greater tuberosity seen on frontal and internal rotation views.  Scapula appears intact.  Distal clavicle and AC joint appear within normal limits.  The greater tuberosity fracture is nondisplaced.  There is no convincing extension into the articular surface of the humeral head or lesser tuberosity although CT is frequently useful for further assessment.  IMPRESSION: Nondisplaced left greater tuberosity fracture.  Consider CT for further assessment.   Original Report Authenticated By: Andreas Newport, M.D.    Dg Humerus Left  11/15/2012  *RADIOLOGY REPORT*  Clinical Data: Fall with shoulder pain and decreased range of motion.  LEFT HUMERUS - 2+ VIEW  Comparison: None.  Findings: There is a nondisplaced greater tuberosity fracture.  No dislocation.  Visualized portion of the left chest is unremarkable.  IMPRESSION: Nondisplaced greater tuberosity fracture.   Original Report Authenticated By: Leanna Battles, M.D.     Assessment and Plan:  Shoulder pain, left - Plan: DG Humerus Left, DG Shoulder Left  Fracture of humerus, proximal, left, closed, initial encounter  X-rays and examination are consistent with proximal humerus fracture. Consistent with nondisplaced perfectly aligned greater tuberosity fracture.  Plan to place her in a sling and will continue to follow her  clinically. I will recheck the patient in 2 weeks. Expect 6-8 weeks for complete resolution. Immobilization for approximately 4 weeks. We will check x-rays in 2 weeks.  Orders Today:  Orders Placed This Encounter  Procedures  . DG Humerus Left    Standing Status: Future     Number of Occurrences: 1     Standing Expiration Date: 01/15/2014    Order Specific Question:  Reason for exam:    Answer:  trauma eval for prox humerus fx    Order Specific Question:  Preferred imaging location?    Answer:  [facility]  . DG Shoulder Left    Standing Status: Future     Number of Occurrences: 1     Standing Expiration Date: 01/15/2014    Order Specific Question:  Reason for  exam:    Answer:  prox humerus fx vs other acute injury    Order Specific Question:  Preferred imaging location?    Answer:  [facility]    Updated Medication List: (Includes new medications, updates to list, dose adjustments) Meds ordered this encounter  Medications  . HYDROcodone-acetaminophen (NORCO/VICODIN) 5-325 MG per tablet    Sig: Take 1 tablet by mouth every 6 (six) hours as needed for pain.    Dispense:  40 tablet    Refill:  0  . traMADol (ULTRAM) 50 MG tablet    Sig: Take 1 tablet (50 mg total) by mouth every 6 (six) hours as needed for pain.    Dispense:  50 tablet    Refill:  2    Medications Discontinued: There are no discontinued medications.    Signed, Elpidio Galea. Copland, MD 11/15/2012 3:38 PM

## 2012-11-15 NOTE — Telephone Encounter (Signed)
Left message asking patient to call back

## 2012-11-16 DIAGNOSIS — S42202A Unspecified fracture of upper end of left humerus, initial encounter for closed fracture: Secondary | ICD-10-CM | POA: Insufficient documentation

## 2012-11-29 ENCOUNTER — Ambulatory Visit: Payer: Self-pay | Admitting: Family Medicine

## 2012-12-27 ENCOUNTER — Telehealth: Payer: Self-pay | Admitting: Family Medicine

## 2012-12-27 ENCOUNTER — Ambulatory Visit (INDEPENDENT_AMBULATORY_CARE_PROVIDER_SITE_OTHER): Payer: BC Managed Care – PPO | Admitting: Family Medicine

## 2012-12-27 ENCOUNTER — Encounter: Payer: Self-pay | Admitting: Family Medicine

## 2012-12-27 VITALS — BP 122/90 | HR 82 | Temp 98.1°F | Wt 215.0 lb

## 2012-12-27 DIAGNOSIS — R197 Diarrhea, unspecified: Secondary | ICD-10-CM

## 2012-12-27 DIAGNOSIS — K589 Irritable bowel syndrome without diarrhea: Secondary | ICD-10-CM | POA: Insufficient documentation

## 2012-12-27 NOTE — Progress Notes (Signed)
Subjective:    Patient ID: [name], female    DOB: 12/17/1945, 67 y.o.   MRN: [id number]  HPI  67 yo female here with diarrhea.  She complains of increased frequency of BMs every morning for past year. However, this week it is occurring more frequently. They are usually formed.  Today, before breakfast she had  3 loose stools. Describes stools as loose, dark brown with urgency.   She could not get to toliet in time. Blood noted on tissue, but she is not sure if there is blood in toilet.  Denies any abdominal pain.  No nausea or vomiting.  No fatigue.  Last colonoscopy by Dr. Madilyn Fireman (reviewed)- 08/2010- unremarkable except for diverticulosis in sigmoid colon.  Recommended recall in 5-10 years.  No recent abx use or stay in hospital or SNF.  Has been eating more dairy, especially in the evenings.  She likes to drink milk or have a bowl of cereal.  No known food intolerances or allergies.    Patient Active Problem List  Diagnosis  . DERMATOPHYTOSIS OF NAIL  . HYPERTENSION  . HIRSUTISM  . Sciatica neuralgia  . Back pain  . Adrenal adenoma  . Chest pain  . Nocturia  . Muscle pain  . Routine general medical examination at a health care facility  . Fracture of humerus, proximal, left, closed  . Diarrhea   Past Medical History  Diagnosis Date  . Hypertension    Past Surgical History  Procedure Laterality Date  . Cesarean section     History  Substance Use Topics  . Smoking status: Never Smoker   . Smokeless tobacco: Not on file  . Alcohol Use: No   Family History  Problem Relation Age of Onset  . Hypertension Other   . Cancer Other     ovarian  . Cancer Mother   . Heart disease Father    No Known Allergies Current Outpatient Prescriptions on File Prior to Visit  Medication Sig Dispense Refill  . B Complex-C (SUPER B COMPLEX PO) Take 1 tablet by mouth daily.        . Calcium Carbonate-Vitamin D (CALCIUM-VITAMIN D) 600-200 MG-UNIT CAPS Take 1 capsule by mouth  daily.        . Cholecalciferol (VITAMIN D-3) 5000 UNITS TABS Take 1 tablet by mouth daily.        Marland Kitchen gabapentin (NEURONTIN) 400 MG capsule Take 1 capsule (400 mg total) by mouth 5 (five) times daily.  1 capsule  6  . HYDROcodone-acetaminophen (NORCO/VICODIN) 5-325 MG per tablet Take 1 tablet by mouth every 6 (six) hours as needed for pain.  40 tablet  0  . lisinopril-hydrochlorothiazide (PRINZIDE,ZESTORETIC) 10-12.5 MG per tablet TAKE ONE TABLET BY MOUTH EVERY DAY  30 tablet  11  . Multiple Vitamins-Minerals (CENTRUM SILVER PO) Take 1 tablet by mouth daily.        . NON FORMULARY Memoral (take two tablet by mouth daily)      . traMADol (ULTRAM) 50 MG tablet Take 1 tablet (50 mg total) by mouth every 6 (six) hours as needed for pain.  50 tablet  2   No current facility-administered medications on file prior to visit.   The PMH, PSH, Social History, Family History, Medications, and allergies have been reviewed in [facility], and have been updated if relevant.    Review of Systems See HPI    Objective:   Physical Exam BP 122/90  Pulse 82  Temp(Src) 98.1 F (36.7 C)  Wt  215 lb (97.523 kg)  BMI 38.09 kg/m2  General:  Well-developed,well-nourished,in no acute distress; alert,appropriate and cooperative throughout examination Head:  normocephalic and atraumatic.   Eyes:  vision grossly intact, pupils equal, pupils round, and pupils reactive to light.   Lungs:  Normal respiratory effort, chest expands symmetrically. Lungs are clear to auscultation, no crackles or wheezes. Heart:  Normal rate and regular rhythm. S1 and S2 normal without gallop, murmur, click, rub or other extra sounds. Abdomen:  Bowel sounds positive,abdomen soft and non-tender without masses, organomegaly or hernias noted. Rectal:  External hemorrhoid, non thrombosed, normal rectal tone, no fissures or other abnormalities. Hemocult positive Psych:  Cognition and judgment appear intact. Alert and cooperative with normal attention  span and concentration. No apparent delusions, illusions, hallucinations     Assessment & Plan:  1. Diarrhea She is heme positive on exam but does have a small external hemorrhoid likely irritated from increased BMs. I suspect IBS vs intolerance- lactose vs gluten. Will eliminate lactose ( see AVS). Stool cultures. Start pro biotics.  If no improvement, refer back to her gastroenterologist. The patient indicates understanding of these issues and agrees with the plan.  - Clostridium Difficile by PCR; Future - Stool culture; Future

## 2012-12-27 NOTE — Patient Instructions (Addendum)
Consider trail of lactose free diet (back off milk)  Trial of probiotics like align for bloating/IBS symptoms (OTC).  Please keep a symptoms journal- for example- the night after a bowl of ice cream, my symptoms were worse!  Please go to the lab so they can give you information about stool cultures.  If you are not getting better in next several weeks, I would like for you to call Dr. Madilyn Fireman office (GI).

## 2012-12-27 NOTE — Telephone Encounter (Signed)
Patient Information:  Caller Name: [name]  Phone: [phone]  Patient: Alexis, Velez  Gender: Female  DOB: 08/10/1946  Age: 67 Years  PCP: Ruthe Mannan [facility])  Office Follow Up:  Does the office need to follow up with this patient?: No  Instructions For The Office: N/A   Symptoms  Reason For Call & Symptoms: Patient is calling complaining of frequency of BM in the morning within the last year.  However, this week it is occurring more frequently. Today, before breakfast she had x3 stools. Describes as loose, dark brown and they were very rapid onset. She could not get to toliet in time. Blood noted on tissue,  but she is not sure if there is blood in toliet .  Reviewed Health History In EMR: Yes  Reviewed Medications In EMR: Yes  Reviewed Allergies In EMR: Yes  Reviewed Surgeries / Procedures: Yes  Date of Onset of Symptoms: 12/20/2012  Guideline(s) Used:  Rectal Bleeding  Disposition Per Guideline:   See Today in Office  Reason For Disposition Reached:   All other patients with rectal bleeding (Exceptions: blood just on toilet paper, few drops, streaks on surface of normal formed BM)  Advice Given:  Call Back If:  Bleeding increases in amount  Bleeding occurs 3 or more times after treatment begins  You become worse.  General Instructions for Treating and Preventing Constipation:   Eat a high fiber diet.  Drink adequate liquids.  Don't ignore your body's signals to have a BM.  Patient Will Follow Care Advice:  YES  Appointment Scheduled:  12/27/2012 12:30:00 Appointment Scheduled Provider:  Ruthe Mannan [facility])

## 2012-12-30 ENCOUNTER — Other Ambulatory Visit: Payer: Self-pay | Admitting: Family Medicine

## 2013-01-03 ENCOUNTER — Encounter: Payer: Self-pay | Admitting: Family Medicine

## 2013-01-03 ENCOUNTER — Ambulatory Visit (INDEPENDENT_AMBULATORY_CARE_PROVIDER_SITE_OTHER): Payer: BC Managed Care – PPO | Admitting: Family Medicine

## 2013-01-03 ENCOUNTER — Ambulatory Visit (INDEPENDENT_AMBULATORY_CARE_PROVIDER_SITE_OTHER)
Admission: RE | Admit: 2013-01-03 | Discharge: 2013-01-03 | Disposition: A | Discharge status: Home / Self Care | Payer: BC Managed Care – PPO | Source: Ambulatory Visit | Attending: Family Medicine | Admitting: Family Medicine

## 2013-01-03 VITALS — BP 140/84 | HR 86 | Temp 97.9°F | Ht 63.0 in | Wt 215.5 lb

## 2013-01-03 DIAGNOSIS — S42309S Unspecified fracture of shaft of humerus, unspecified arm, sequela: Secondary | ICD-10-CM

## 2013-01-03 DIAGNOSIS — S42202S Unspecified fracture of upper end of left humerus, sequela: Secondary | ICD-10-CM

## 2013-01-03 DIAGNOSIS — M79602 Pain in left arm: Secondary | ICD-10-CM

## 2013-01-03 DIAGNOSIS — M79609 Pain in unspecified limb: Secondary | ICD-10-CM

## 2013-01-03 NOTE — Patient Instructions (Addendum)
REFERRAL: GO THE THE FRONT ROOM AT THE ENTRANCE OF OUR CLINIC, NEAR CHECK IN. ASK FOR MARION. SHE WILL HELP YOU SET UP YOUR REFERRAL. DATE: TIME:  

## 2013-01-03 NOTE — Progress Notes (Signed)
Nature conservation officer at [facility] [address] [place] [address] Phone: [phone] Fax: [phone]  Date:  01/03/2013   Name:  [name]   DOB:  [birth year]   MRN:  [id number] Gender: female Age: 67 y.o.  Primary Physician:  Ruthe Mannan, MD  Evaluating MD: Hannah Beat, MD   Chief Complaint: left arm issues   History of Present Illness:  [name] is a 67 y.o. pleasant patient who presents with the following:  F/u L proximal humerus fracture.   My prior office note is included below. The patient had a proximal humerus greater tuberosity fracture, and was immobilized with a sling. At the time of our initial encounter, I reviewed potential complications with this injury and recommended classical followup to ensure the integrity of bone healing.  Unfortunately, the patient did not followup at her two-week recheck. I had our office lead RN call her until she got her on the phone and strongly recommended that she have routine followup and care in the management of her fracture. She declined to make additional followup appointments. Also offered to set her up with another MD.  She is here today, and  She complains about pain in her LEFT shoulder. She is having pain around the humeral head, limited motion and limited strength. She is also complaining some LEFT posterior shoulder blade and muscular pain.  11/15/2012 OV: Left shoulder, walked down the curb on the arm itself. Landed on point of shoulder 6 days ago. Date of injury is 6 days prior from encounter on November 10, 2011.  No significant history from her shoulder musculoskeletal standpoint. The patient fell on the point of her shoulder and on her midshaft and proximal humerus as she was going down on the curb. She has been unable to lift her arm above 45, and she is having pain and significant bruising throughout the upper arm. She stayed in after the snow and ice over the last few days, and today is her 1st time  seeking evaluation. She called in the afternoon and I was asked to evaluate the patient emergently.  Patient Active Problem List  Diagnosis  . DERMATOPHYTOSIS OF NAIL  . HYPERTENSION  . HIRSUTISM  . Sciatica neuralgia  . Back pain  . Adrenal adenoma  . Chest pain  . Nocturia  . Muscle pain  . Routine general medical examination at a health care facility  . Fracture of humerus, proximal, left, closed  . Diarrhea    Past Medical History  Diagnosis Date  . Hypertension     Past Surgical History  Procedure Laterality Date  . Cesarean section      History   Social History  . Marital Status: Single    Spouse Name: N/A    Number of Children: N/A  . Years of Education: N/A   Occupational History  . Not on file.   Social History Main Topics  . Smoking status: Never Smoker   . Smokeless tobacco: Not on file  . Alcohol Use: No  . Drug Use: No  . Sexually Active: Not on file   Other Topics Concern  . Not on file   Social History Narrative  . No narrative on file    Family History  Problem Relation Age of Onset  . Hypertension Other   . Cancer Other     ovarian  . Cancer Mother   . Heart disease Father     No Known Allergies  Medication list has been reviewed  and updated.  Outpatient Prescriptions Prior to Visit  Medication Sig Dispense Refill  . B Complex-C (SUPER B COMPLEX PO) Take 1 tablet by mouth daily.        . Calcium Carbonate-Vitamin D (CALCIUM-VITAMIN D) 600-200 MG-UNIT CAPS Take 1 capsule by mouth daily.       . Cholecalciferol (VITAMIN D-3) 5000 UNITS TABS Take 1 tablet by mouth daily.        Marland Kitchen gabapentin (NEURONTIN) 400 MG capsule Take 1 capsule (400 mg total) by mouth 5 (five) times daily.  1 capsule  6  . lisinopril-hydrochlorothiazide (PRINZIDE,ZESTORETIC) 10-12.5 MG per tablet TAKE ONE TABLET BY MOUTH EVERY DAY  30 tablet  11  . Multiple Vitamins-Minerals (CENTRUM SILVER PO) Take 1 tablet by mouth daily.        . NON FORMULARY Memoral  (take two tablet by mouth daily)      . traMADol (ULTRAM) 50 MG tablet TAKE 1 TABLET BY MOUTH EVERY 6 HOURS AS NEEDED FOR PAIN  50 tablet  2  . HYDROcodone-acetaminophen (NORCO/VICODIN) 5-325 MG per tablet Take 1 tablet by mouth every 6 (six) hours as needed for pain.  40 tablet  0   No facility-administered medications prior to visit.    Review of Systems:   GEN: No fevers, chills. Nontoxic. Primarily MSK c/o today. MSK: Detailed in the HPI GI: tolerating PO intake without difficulty Neuro: No numbness, parasthesias, or tingling associated. Otherwise the pertinent positives of the ROS are noted above.    Physical Examination: BP 140/84  Pulse 86  Temp(Src) 97.9 F (36.6 C) (Oral)  Ht 5\' 3"  (1.6 m)  Wt 215 lb 8 oz (97.75 kg)  BMI 38.18 kg/m2  SpO2 99%  Ideal Body Weight: Weight in (lb) to have BMI = 25: 140.8   GEN: WDWN, NAD, Non-toxic, Alert & Oriented x 3 HEENT: Atraumatic, Normocephalic.  Ears and Nose: No external deformity. EXTR: No clubbing/cyanosis/edema NEURO: Normal gait.  PSYCH: Normally interactive. Conversant. Not depressed or anxious appearing.  Calm demeanor.   LEFT shoulder: The patient remains tender with compression at the proximal humerus. Vigorous range of motion testing was not attempted given known fracture. NT posterior shoulder blade, clavicle, ac joint, c spine.  Dg Shoulder Left  01/03/2013  *RADIOLOGY REPORT*  Clinical Data: Follow-up fracture.  LEFT SHOULDER - 2+ VIEW  Comparison: 11/15/2012  Findings: Fracture through the greater tuberosity of the left proximal humerus again noted.  Slight depression at the greater tuberosity superiorly.  Fracture line remains at least partially evident.  No new acute bony abnormality.  No subluxation or dislocation.  IMPRESSION: Left proximal humeral greater tuberosity fracture again noted and remains partially evident.  Slight depression of the fracture at the superior aspect.   Original Report Authenticated By:  Charlett Nose, M.D.     Assessment and Plan:  Proximal humerus fracture, left, sequela - Plan: DG Shoulder Left, Ambulatory referral to Orthopedic Surgery  Left arm pain - Plan: Ambulatory referral to Orthopedic Surgery  >25 minutes spent in face to face time with patient, >50% spent in counselling or coordination of care: this has become a more complicated case, and the patient did not followup for 6 weeks. I reviewed the involved anatomy using anatomical models with the patient more than 10 times until she indicated that she understood. Also reviewed with her again that there is a chance with this injury that the rotator cuff can be injured. I suspect that her compliance  Is the biggest issue at  play here.  At this point, I think it is most wise to involve one of the orthopedists with an interest in shoulder. Thankfully, Dr. Dion Saucier is going to be able to see her on Wednesday.  Orders Today:  Orders Placed This Encounter  Procedures  . DG Shoulder Left    Standing Status: Future     Number of Occurrences: 1     Standing Expiration Date: 03/05/2014    Order Specific Question:  Preferred imaging location?    Answer:  [facility]    Order Specific Question:  Reason for exam:    Answer:  f/u fracture, humerus, lost to follow-up  . Ambulatory referral to Orthopedic Surgery    Referral Priority:  Routine    Referral Type:  Surgical    Referral Reason:  Specialty Services Required    Requested Specialty:  Orthopedic Surgery    Number of Visits Requested:  1    Updated Medication List: (Includes new medications, updates to list, dose adjustments) No orders of the defined types were placed in this encounter.    Medications Discontinued: There are no discontinued medications.    Signed, Elpidio Galea. Herald Vallin, MD 01/03/2013 10:21 AM

## 2013-01-07 ENCOUNTER — Other Ambulatory Visit: Payer: Self-pay | Admitting: Family Medicine

## 2013-01-07 ENCOUNTER — Other Ambulatory Visit (INDEPENDENT_AMBULATORY_CARE_PROVIDER_SITE_OTHER): Payer: BC Managed Care – PPO

## 2013-01-07 DIAGNOSIS — Z1289 Encounter for screening for malignant neoplasm of other sites: Secondary | ICD-10-CM

## 2013-01-10 ENCOUNTER — Encounter: Payer: Self-pay | Admitting: Family Medicine

## 2013-01-10 ENCOUNTER — Encounter: Payer: Self-pay | Admitting: *Deleted

## 2013-01-17 ENCOUNTER — Ambulatory Visit: Payer: BC Managed Care – PPO | Attending: Orthopedic Surgery | Admitting: Physical Therapy

## 2013-01-17 DIAGNOSIS — M25519 Pain in unspecified shoulder: Secondary | ICD-10-CM | POA: Insufficient documentation

## 2013-01-17 DIAGNOSIS — IMO0001 Reserved for inherently not codable concepts without codable children: Secondary | ICD-10-CM | POA: Insufficient documentation

## 2013-01-17 DIAGNOSIS — M25619 Stiffness of unspecified shoulder, not elsewhere classified: Secondary | ICD-10-CM | POA: Insufficient documentation

## 2013-01-20 ENCOUNTER — Other Ambulatory Visit: Payer: Self-pay

## 2013-01-20 MED ORDER — LISINOPRIL-HYDROCHLOROTHIAZIDE 10-12.5 MG PO TABS: ORAL_TABLET | ORAL | Status: DC

## 2013-01-20 NOTE — Telephone Encounter (Signed)
Pt request refill lisinopril HCTZ to [facility] [address]. Pt advised done. Pt said would call back to schedule CPX.

## 2013-01-21 ENCOUNTER — Ambulatory Visit: Payer: BC Managed Care – PPO

## 2013-01-25 ENCOUNTER — Ambulatory Visit: Payer: BC Managed Care – PPO | Admitting: Rehabilitation

## 2013-01-28 ENCOUNTER — Ambulatory Visit: Payer: BC Managed Care – PPO | Admitting: Rehabilitation

## 2013-02-01 ENCOUNTER — Ambulatory Visit: Payer: BC Managed Care – PPO | Admitting: Rehabilitation

## 2013-02-03 ENCOUNTER — Encounter: Payer: BC Managed Care – PPO | Admitting: Rehabilitation

## 2013-02-09 ENCOUNTER — Ambulatory Visit: Payer: BC Managed Care – PPO | Attending: Orthopedic Surgery | Admitting: Physical Therapy

## 2013-02-09 DIAGNOSIS — M25519 Pain in unspecified shoulder: Secondary | ICD-10-CM | POA: Insufficient documentation

## 2013-02-09 DIAGNOSIS — IMO0001 Reserved for inherently not codable concepts without codable children: Secondary | ICD-10-CM | POA: Insufficient documentation

## 2013-02-09 DIAGNOSIS — M25619 Stiffness of unspecified shoulder, not elsewhere classified: Secondary | ICD-10-CM | POA: Insufficient documentation

## 2013-02-14 ENCOUNTER — Ambulatory Visit: Payer: BC Managed Care – PPO | Admitting: Physical Therapy

## 2013-02-17 ENCOUNTER — Encounter: Payer: BC Managed Care – PPO | Admitting: Rehabilitation

## 2013-02-18 ENCOUNTER — Encounter: Payer: Self-pay | Admitting: Radiology

## 2013-02-21 ENCOUNTER — Encounter: Payer: Self-pay | Admitting: Family Medicine

## 2013-02-21 ENCOUNTER — Ambulatory Visit (INDEPENDENT_AMBULATORY_CARE_PROVIDER_SITE_OTHER): Payer: BC Managed Care – PPO | Admitting: Family Medicine

## 2013-02-21 ENCOUNTER — Encounter: Payer: BC Managed Care – PPO | Admitting: Rehabilitation

## 2013-02-21 VITALS — BP 130/80 | HR 80 | Temp 97.7°F | Wt 211.0 lb

## 2013-02-21 DIAGNOSIS — D179 Benign lipomatous neoplasm, unspecified: Secondary | ICD-10-CM

## 2013-02-21 DIAGNOSIS — R197 Diarrhea, unspecified: Secondary | ICD-10-CM

## 2013-02-21 NOTE — Progress Notes (Signed)
Subjective:    Patient ID: [name], female    DOB: 06-11-1946, 67 y.o.   MRN: [id number]  67 yo female here with:  1.  Persistent diarrhea- I saw her for this complaint in April.  She complains of increased frequency of BMs every morning for past year. However, this past several months,  it is occurring more frequently. They are usually formed.   No blood in stool or abdominal pain.  Denies any abdominal pain.  No nausea or vomiting.  No fatigue.  Last colonoscopy by Dr. Madilyn Fireman (reviewed)- 08/2010- unremarkable except for diverticulosis in sigmoid colon.  Recommended recall in 5-10 years.  No recent abx use or stay in hospital or SNF. C diff and stool cx neg.  Advised to cut out lactose and start probiotics which helped for short periods of time.  Has been eating more dairy, especially in the evenings.  She likes to drink milk or have a bowl of cereal.  No known food intolerances or allergies.  2.  Mass left neck-  Does not think it is growing in size.  Not painful.  PT noticed it.    Patient Active Problem List   Diagnosis Date Noted  . Lipoma 02/21/2013  . Diarrhea 12/27/2012  . Fracture of humerus, proximal, left, closed 11/16/2012  . Chest pain 04/01/2012  . Nocturia 04/01/2012  . Muscle pain 04/01/2012  . Sciatica neuralgia 05/19/2011  . HYPERTENSION 08/06/2010  . HIRSUTISM 08/06/2010   Past Medical History  Diagnosis Date  . Hypertension    Past Surgical History  Procedure Laterality Date  . Cesarean section     History  Substance Use Topics  . Smoking status: Never Smoker   . Smokeless tobacco: Not on file  . Alcohol Use: No   Family History  Problem Relation Age of Onset  . Hypertension Other   . Cancer Other     ovarian  . Cancer Mother   . Heart disease Father    No Known Allergies Current Outpatient Prescriptions on File Prior to Visit  Medication Sig Dispense Refill  . lisinopril-hydrochlorothiazide (PRINZIDE,ZESTORETIC) 10-12.5 MG per  tablet TAKE ONE TABLET BY MOUTH EVERY DAY  30 tablet  5  . B Complex-C (SUPER B COMPLEX PO) Take 1 tablet by mouth daily.        . Calcium Carbonate-Vitamin D (CALCIUM-VITAMIN D) 600-200 MG-UNIT CAPS Take 1 capsule by mouth daily.       . Cholecalciferol (VITAMIN D-3) 5000 UNITS TABS Take 1 tablet by mouth daily.        . Multiple Vitamins-Minerals (CENTRUM SILVER PO) Take 1 tablet by mouth daily.        . NON FORMULARY Memoral (take two tablet by mouth daily)       No current facility-administered medications on file prior to visit.   The PMH, PSH, Social History, Family History, Medications, and allergies have been reviewed in [facility], and have been updated if relevant.    Review of Systems See HPI    Objective:   Physical Exam BP 130/80  Pulse 80  Temp(Src) 97.7 F (36.5 C)  Wt 211 lb (95.709 kg)  BMI 37.39 kg/m2  General:  Well-developed,well-nourished,in no acute distress; alert,appropriate and cooperative throughout examination Head:  normocephalic and atraumatic. Neck:  Freely movable fullness, soft, above left clavicle.   Eyes:  vision grossly intact, pupils equal, pupils round, and pupils reactive to light.   Lungs:  Normal respiratory effort, chest expands symmetrically. Lungs are clear to auscultation,  no crackles or wheezes. Heart:  Normal rate and regular rhythm. S1 and S2 normal without gallop, murmur, click, rub or other extra sounds. Abdomen:  Bowel sounds positive,abdomen soft and non-tender without masses, organomegaly or hernias noted. Psych:  Cognition and judgment appear intact. Alert and cooperative with normal attention span and concentration. No apparent delusions, illusions, hallucinations     Assessment & Plan:  1. Diarrhea Persistent.  At this point, will refer back to Dr. Madilyn Fireman for his suggestions. The patient indicates understanding of these issues and agrees with the plan.  2. Probable lipoma- Advised [place] and surgical referfal but she would like to  hold off.

## 2013-02-21 NOTE — Patient Instructions (Addendum)
Good to see you. Please stop by to see Shirlee Limerick on your way out to set up your appointment with Dr. Madilyn Fireman to discuss your diarrhea.

## 2013-02-24 ENCOUNTER — Encounter: Payer: BC Managed Care – PPO | Admitting: Physical Therapy

## 2013-03-15 ENCOUNTER — Telehealth: Payer: Self-pay

## 2013-03-15 NOTE — Telephone Encounter (Signed)
Advised patient as instructed.  She then asked if you would prescribe growth hormones.  I told her no.

## 2013-03-15 NOTE — Telephone Encounter (Signed)
Pt left v/m requesting hormone called Calcitronin sent to [facility]. Pt is trying to lose weight and Calcitronin helps with stronger bones and muscles. Pt request cb.

## 2013-03-15 NOTE — Telephone Encounter (Signed)
I do not prescribe this.  She will need to go to a weight loss clinic that prescribes this if she is interested.

## 2013-03-15 NOTE — Telephone Encounter (Signed)
Left message asking patient to call back

## 2013-06-13 ENCOUNTER — Other Ambulatory Visit: Payer: Self-pay | Admitting: *Deleted

## 2013-06-13 MED ORDER — LISINOPRIL-HYDROCHLOROTHIAZIDE 10-12.5 MG PO TABS: ORAL_TABLET | ORAL | Status: DC

## 2013-07-04 ENCOUNTER — Telehealth: Payer: Self-pay

## 2013-07-04 NOTE — Telephone Encounter (Signed)
Pt wanted to know if has received immunization since 08/15/2011. Advised pt 3 injections on immunization list and do not see any immunization listed since 08/15/2011. Pt voiced understanding.

## 2013-08-23 ENCOUNTER — Other Ambulatory Visit: Payer: Self-pay | Admitting: *Deleted

## 2013-08-23 MED ORDER — LISINOPRIL-HYDROCHLOROTHIAZIDE 10-12.5 MG PO TABS: ORAL_TABLET | ORAL | Status: DC

## 2013-09-06 ENCOUNTER — Encounter: Payer: Self-pay | Admitting: Family Medicine

## 2013-09-06 ENCOUNTER — Ambulatory Visit (INDEPENDENT_AMBULATORY_CARE_PROVIDER_SITE_OTHER): Payer: Medicare Other | Admitting: Family Medicine

## 2013-09-06 ENCOUNTER — Encounter: Payer: Self-pay | Admitting: *Deleted

## 2013-09-06 ENCOUNTER — Telehealth: Payer: Self-pay

## 2013-09-06 VITALS — BP 142/74 | HR 68 | Temp 97.3°F | Ht 62.0 in | Wt 212.2 lb

## 2013-09-06 DIAGNOSIS — Z0000 Encounter for general adult medical examination without abnormal findings: Secondary | ICD-10-CM

## 2013-09-06 DIAGNOSIS — Z23 Encounter for immunization: Secondary | ICD-10-CM

## 2013-09-06 DIAGNOSIS — Z1211 Encounter for screening for malignant neoplasm of colon: Secondary | ICD-10-CM

## 2013-09-06 LAB — LIPID PANEL
HDL: 56.9 mg/dL (ref >39.00)
LDL Cholesterol: 92 mg/dL (ref 0–99)
Total CHOL/HDL Ratio: 3
Triglycerides: 160 mg/dL — ABNORMAL HIGH (ref 0.0–149.0)

## 2013-09-06 LAB — COMPREHENSIVE METABOLIC PANEL
AST: 18 U/L (ref 0–37)
Alkaline Phosphatase: 56 U/L (ref 39–117)
BUN: 17 mg/dL (ref 6–23)
Creatinine, Ser: 0.8 mg/dL (ref 0.4–1.2)

## 2013-09-06 NOTE — Patient Instructions (Signed)
Good to see you. Have a happy new year. We will call you with your lab results- you may also view them online.

## 2013-09-06 NOTE — Progress Notes (Signed)
Pre-visit discussion using our clinic review tool. No additional management support is needed unless otherwise documented below in the visit note.   Subjective:    Patient ID: [name], female    DOB: Jun 01, 1946, 67 y.o.   MRN: [id number]  HPI  67 yo G4P4 here for Welcome to Medicare Visit.  I have personally reviewed the Medicare Annual Wellness questionnaire and have noted 1. The patient's medical and social history 2. Their use of alcohol, tobacco or illicit drugs 3. Their current medications and supplements 4. The patient's functional ability including ADL's, fall risks, home safety risks and hearing or visual             impairment. 5. Diet and physical activities 6. Evidence for depression or mood disorders  End of life wishes discussed and updated in Social History.  Refuses to have more mammograms.  Due for pneumovax.  HTN- blood pressure well controlled on Prinzide.  Has known h/o L4-L5 spinal stenosis, left sided radiculopathy. Feels symptoms are stable.     Lab Results  Component Value Date   CHOL 171 04/07/2012   HDL 67.90 04/07/2012   LDLCALC 82 04/07/2012   TRIG 104.0 04/07/2012   CHOLHDL 3 04/07/2012     Patient Active Problem List   Diagnosis Date Noted  . Welcome to Medicare preventive visit 09/06/2013  . Lipoma 02/21/2013  . Diarrhea 12/27/2012  . Fracture of humerus, proximal, left, closed 11/16/2012  . Chest pain 04/01/2012  . Nocturia 04/01/2012  . Muscle pain 04/01/2012  . Sciatica neuralgia 05/19/2011  . HYPERTENSION 08/06/2010  . HIRSUTISM 08/06/2010   Past Medical History  Diagnosis Date  . Hypertension    Past Surgical History  Procedure Laterality Date  . Cesarean section     History  Substance Use Topics  . Smoking status: Never Smoker   . Smokeless tobacco: Not on file  . Alcohol Use: No   Family History  Problem Relation Age of Onset  . Hypertension Other   . Cancer Other     ovarian  . Cancer Mother   . Heart  disease Father    No Known Allergies Current Outpatient Prescriptions on File Prior to Visit  Medication Sig Dispense Refill  . Calcium Carbonate-Vitamin D (CALCIUM-VITAMIN D) 600-200 MG-UNIT CAPS Take 1 capsule by mouth daily.       Marland Kitchen lisinopril-hydrochlorothiazide (PRINZIDE,ZESTORETIC) 10-12.5 MG per tablet TAKE ONE TABLET BY MOUTH EVERY DAY.  30 tablet  0  . Multiple Vitamins-Minerals (CENTRUM SILVER PO) Take 1 tablet by mouth daily.        . NON FORMULARY Memoral (take two tablet by mouth daily)      . Cholecalciferol (VITAMIN D-3) 5000 UNITS TABS Take 1 tablet by mouth daily.         No current facility-administered medications on file prior to visit.   The PMH, PSH, Social History, Family History, Medications, and allergies have been reviewed in [facility], and have been updated if relevant.   Review of Systems    See HPI Patient reports no  vision/ hearing changes,anorexia, weight change, fever ,adenopathy, persistant / recurrent hoarseness, swallowing issues, chest pain, edema,persistant / recurrent cough, hemoptysis, dyspnea(rest, exertional, paroxysmal nocturnal), gastrointestinal  bleeding (melena, rectal bleeding), abdominal pain, excessive heart burn, GU symptoms(dysuria, hematuria, pyuria, voiding/incontinence  Issues) syncope, focal weakness, severe memory loss, concerning skin lesions, depression, anxiety, abnormal bruising/bleeding, major joint swelling, breast masses or abnormal vaginal bleeding.    Objective:   Physical Exam BP 142/74  Pulse  68  Temp(Src) 97.3 F (36.3 C) (Oral)  Ht 5\' 2"  (1.575 m)  Wt 212 lb 4 oz (96.276 kg)  BMI 38.81 kg/m2  SpO2 95%  General:  Well-developed,well-nourished,in no acute distress; alert,appropriate and cooperative throughout examination Head:  normocephalic and atraumatic.   Eyes:  vision grossly intact, pupils equal, pupils round, and pupils reactive to light.   Ears:  R ear normal and L ear normal.   Nose:  no external deformity.    Mouth:  good dentition.   Breasts:  No mass, nodules, thickening, tenderness, bulging, retraction, inflamation, nipple discharge or skin changes noted.   Lungs:  Normal respiratory effort, chest expands symmetrically. Lungs are clear to auscultation, no crackles or wheezes. Heart:  Normal rate and regular rhythm. S1 and S2 normal without gallop, murmur, click, rub or other extra sounds. Msk:  No deformity or scoliosis noted of thoracic or lumbar spine.   Extremities:  Normal strength and sensation in all extremities. Neurologic:  alert & oriented X3 and gait normal.   Skin:  Intact without suspicious lesions or rashes Psych:  Cognition and judgment appear intact. Alert and cooperative with normal attention span and concentration. No apparent delusions, illusions, hallucinations  Assessment & Plan:   1. Medicare welcome visit The patients weight, height, BMI and visual acuity have been recorded in the chart I have made referrals, counseling and provided education to the patient based review of the above and I have provided the pt with a written personalized care plan for preventive services. EKG- no changes from prior.  - EKG 12-Lead - Comprehensive metabolic panel - Lipid Panel  2. Special screening for malignant neoplasms, colon  - Fecal occult blood, imunochemical; Future  3. Need for prophylactic vaccination against Streptococcus pneumoniae (pneumococcus)  - Pneumococcal polysaccharide vaccine 23-valent greater than or equal to 2yo subcutaneous/IM

## 2013-09-06 NOTE — Telephone Encounter (Signed)
Pt wants mychart activation code; pt looked at AVS given today and found activation code. Nothing further needed.

## 2013-09-08 HISTORY — PX: HEMORRHOID SURGERY: SHX153

## 2013-09-11 ENCOUNTER — Encounter: Payer: Self-pay | Admitting: Family Medicine

## 2013-09-28 ENCOUNTER — Emergency Department: Payer: Self-pay | Admitting: Emergency Medicine

## 2013-09-29 ENCOUNTER — Telehealth: Payer: Self-pay | Admitting: Family Medicine

## 2013-09-29 DIAGNOSIS — R040 Epistaxis: Secondary | ICD-10-CM

## 2013-09-29 NOTE — Telephone Encounter (Signed)
Pt was seen in ER at [facility] last night for a bloody nose that wouldn't stop bleeding.  [name] says she needs to be seen by and ENT and suggested Dr. Clyde Canterbury @ [facility] in [place], [place] #[phone]. She says her nose is packed and they said she needs to be seen on Monday, October 03, 2013. Pt says she needs a referral.  Can you place the referral? Thank you.

## 2013-09-29 NOTE — Telephone Encounter (Signed)
Rosaria Ferries, please see below.  I placed referral but I have not seen her for this.

## 2013-10-13 ENCOUNTER — Telehealth: Payer: Self-pay | Admitting: Family Medicine

## 2013-10-13 DIAGNOSIS — K649 Unspecified hemorrhoids: Secondary | ICD-10-CM

## 2013-10-13 NOTE — Telephone Encounter (Signed)
Call transferred to me, patient called her GI Dr Amedeo Plenty office and they dont do banding. They told her to go to [facility] so please put a referral in for [facility].

## 2013-10-13 NOTE — Telephone Encounter (Signed)
Lm on pts vm requesting a call back 

## 2013-10-13 NOTE — Telephone Encounter (Signed)
Pt request to have hemorrhoid banned. Please advise.

## 2013-10-13 NOTE — Telephone Encounter (Signed)
error 

## 2013-10-13 NOTE — Telephone Encounter (Signed)
Does she have a gastroenterologist? If not, we can refer her.

## 2013-10-13 NOTE — Telephone Encounter (Signed)
Referral placed.

## 2013-10-14 NOTE — Telephone Encounter (Signed)
Lm on pts vm requesting a call back 

## 2013-10-25 ENCOUNTER — Ambulatory Visit (INDEPENDENT_AMBULATORY_CARE_PROVIDER_SITE_OTHER): Payer: Commercial Managed Care - HMO | Admitting: General Surgery

## 2013-11-07 ENCOUNTER — Encounter (INDEPENDENT_AMBULATORY_CARE_PROVIDER_SITE_OTHER): Payer: Self-pay | Admitting: General Surgery

## 2013-11-07 ENCOUNTER — Ambulatory Visit (INDEPENDENT_AMBULATORY_CARE_PROVIDER_SITE_OTHER): Payer: Commercial Managed Care - HMO | Admitting: General Surgery

## 2013-11-07 VITALS — BP 130/82 | HR 74 | Temp 98.2°F | Ht 62.0 in | Wt 207.0 lb

## 2013-11-07 DIAGNOSIS — K649 Unspecified hemorrhoids: Secondary | ICD-10-CM

## 2013-11-07 NOTE — Progress Notes (Signed)
Patient ID: [name], female   DOB: 05/02/1946, 68 y.o.   MRN: [id number]  Chief Complaint  Patient presents with  . Rectal Bleeding    HPI [name] is a 68 y.o. female.  We are asked to see the patient in consultation by Dr. Arnette Norris to evaluate her for hemorrhoids. The patient is a 68 year old white female who has been experiencing bleeding with her bowel movements. This is occurring about every other day. This has been going on for quite some time. She denies any rectal pain. She denies any constipation or incontinence. She states that she has had a colonoscopy in the last couple years that was negative.  HPI  Past Medical History  Diagnosis Date  . Hypertension     Past Surgical History  Procedure Laterality Date  . Cesarean section      Family History  Problem Relation Age of Onset  . Hypertension Other   . Cancer Other     ovarian  . Cancer Mother   . Heart disease Father     Social History History  Substance Use Topics  . Smoking status: Never Smoker   . Smokeless tobacco: Not on file  . Alcohol Use: No    No Known Allergies  Current Outpatient Prescriptions  Medication Sig Dispense Refill  . Ascorbic Acid (C-1000 PO) Take by mouth.      . Calcium Carbonate-Vitamin D (CALCIUM-VITAMIN D) 600-200 MG-UNIT CAPS Take 4 capsules by mouth daily.       . Lactobacillus Rhamnosus, GG, (CULTURELLE) CAPS Take by mouth.      Marland Kitchen lisinopril-hydrochlorothiazide (PRINZIDE,ZESTORETIC) 10-12.5 MG per tablet TAKE ONE TABLET BY MOUTH EVERY DAY.  30 tablet  0  . NON FORMULARY Memoral (take two tablet by mouth daily)      . Omega 3-6-9 Fatty Acids (OMEGA 3-6-9 COMPLEX) CAPS Take by mouth.       No current facility-administered medications for this visit.    Review of Systems Review of Systems  Constitutional: Negative.   HENT: Negative.   Eyes: Negative.   Respiratory: Negative.   Cardiovascular: Negative.   Gastrointestinal: Positive for blood in stool.   Endocrine: Negative.   Genitourinary: Negative.   Musculoskeletal: Negative.   Skin: Negative.   Allergic/Immunologic: Negative.   Neurological: Negative.   Hematological: Negative.   Psychiatric/Behavioral: Negative.     Blood pressure 130/82, pulse 74, temperature 98.2 F (36.8 C), temperature source Temporal, height 5\' 2"  (1.575 m), weight 207 lb (93.895 kg).  Physical Exam Physical Exam  Constitutional: She is oriented to person, place, and time. She appears well-developed and well-nourished.  HENT:  Head: Normocephalic and atraumatic.  Eyes: Conjunctivae and EOM are normal. Pupils are equal, round, and reactive to light.  Neck: Normal range of motion. Neck supple.  Cardiovascular: Normal rate, regular rhythm and normal heart sounds.   Pulmonary/Chest: Effort normal and breath sounds normal.  Abdominal: Soft. Bowel sounds are normal.  Genitourinary:  Her perirectal skin looks healthy. She does have a moderate sized external hemorrhoidal skin tag anteriorly. On digital exam she has good rectal tone. On anoscopic exam she seems to have some moderate sized internal hemorrhoids circumferentially. There are no areas of acute irritation or bleeding.  Musculoskeletal: Normal range of motion.  Lymphadenopathy:    She has no cervical adenopathy.  Neurological: She is alert and oriented to person, place, and time.  Skin: Skin is warm and dry.  Psychiatric: She has a normal mood and affect. Her behavior is  normal.    Data Reviewed As above  Assessment    The patient appears to have a moderate size external hemorrhoidal skin tag that is large enough that it makes it difficult for her to get clean. She also has some moderate size internal hemorrhoidal tissue which is most likely the source of her bleeding given the recent negative colonoscopy. Because of the ongoing bleeding I think she would benefit from having these hemorrhoids removed. I've talked her in detail about the different  options for treatment. To remove the external hemorrhoid I think she will need a formal hemorrhoidectomy. The internal hemorrhoids may be able to be managed with banding versus injection versus formal hemorrhoidectomy. I've discussed with her in detail the risks and benefits the Surgery to remove the hemorrhoids as well as some of the technical aspects and she understands and wishes to proceed     Plan    I will plan for at least a 1 column hemorrhoidectomy. She may need more than one column removed versus internal hemorrhoid banding.        TOTH III,PAUL S 11/07/2013, 12:01 PM

## 2013-11-07 NOTE — Patient Instructions (Signed)
Plan for exam under anesthesia and probable hemorroidectomy with possible banding

## 2013-11-24 ENCOUNTER — Other Ambulatory Visit: Payer: Self-pay

## 2013-11-24 MED ORDER — LISINOPRIL-HYDROCHLOROTHIAZIDE 10-12.5 MG PO TABS: ORAL_TABLET | ORAL | Status: DC

## 2013-11-24 NOTE — Telephone Encounter (Signed)
Pt left note requesting refill lisinopril HCTZ to [facility] [address]; advised pt done.

## 2013-11-24 NOTE — Telephone Encounter (Signed)
Pt said she only got # 60 of lisinopril HCTZ; spoke with Missy at [facility]. Pt picked up # 73 0n 11/22/13 and the rx sent in today is on hold for next refill. Pt voiced understanding.

## 2013-12-20 ENCOUNTER — Telehealth: Payer: Self-pay | Admitting: Family Medicine

## 2013-12-20 NOTE — Telephone Encounter (Signed)
Spoke to pt and appt sched for 12/26/13

## 2013-12-20 NOTE — Telephone Encounter (Signed)
Please have her schedule 30 min appt with me to discuss.

## 2013-12-20 NOTE — Telephone Encounter (Signed)
Pt is wanting to know if she could get a referral to Dr. Jim Like a neurologist with [facility]. Pt says she has spoke with you before about the tingling in her hands and feet and she says she memory is getting worse as well. I told her she may need an appt with Dr. Deborra Medina. Please advise.

## 2013-12-26 ENCOUNTER — Ambulatory Visit (INDEPENDENT_AMBULATORY_CARE_PROVIDER_SITE_OTHER): Payer: Commercial Managed Care - HMO | Admitting: Family Medicine

## 2013-12-26 ENCOUNTER — Encounter: Payer: Self-pay | Admitting: Family Medicine

## 2013-12-26 VITALS — BP 134/76 | HR 71 | Temp 97.4°F | Ht 62.5 in | Wt 203.8 lb

## 2013-12-26 DIAGNOSIS — R413 Other amnesia: Secondary | ICD-10-CM

## 2013-12-26 DIAGNOSIS — G3184 Mild cognitive impairment, so stated: Secondary | ICD-10-CM | POA: Insufficient documentation

## 2013-12-26 DIAGNOSIS — R209 Unspecified disturbances of skin sensation: Secondary | ICD-10-CM

## 2013-12-26 DIAGNOSIS — R202 Paresthesia of skin: Secondary | ICD-10-CM

## 2013-12-26 DIAGNOSIS — I10 Essential (primary) hypertension: Secondary | ICD-10-CM

## 2013-12-26 LAB — COMPREHENSIVE METABOLIC PANEL
ALBUMIN: 3.6 g/dL (ref 3.5–5.2)
ALT: 20 U/L (ref 0–35)
AST: 18 U/L (ref 0–37)
Alkaline Phosphatase: 52 U/L (ref 39–117)
BUN: 17 mg/dL (ref 6–23)
CO2: 28 mEq/L (ref 19–32)
Calcium: 9.1 mg/dL (ref 8.4–10.5)
Chloride: 105 mEq/L (ref 96–112)
Creatinine, Ser: 0.8 mg/dL (ref 0.4–1.2)
GFR: 77.47 mL/min (ref >60.00)
GLUCOSE: 136 mg/dL — AB (ref 70–99)
Potassium: 4 mEq/L (ref 3.5–5.1)
Sodium: 141 mEq/L (ref 135–145)
Total Bilirubin: 0.7 mg/dL (ref 0.3–1.2)
Total Protein: 6.7 g/dL (ref 6.0–8.3)

## 2013-12-26 LAB — CBC WITH DIFFERENTIAL/PLATELET
BASOS ABS: 0 10*3/uL (ref 0.0–0.1)
Basophils Relative: 0.2 % (ref 0.0–3.0)
EOS ABS: 0 10*3/uL (ref 0.0–0.7)
Eosinophils Relative: 0.4 % (ref 0.0–5.0)
HEMATOCRIT: 45.5 % (ref 36.0–46.0)
Hemoglobin: 15.4 g/dL — ABNORMAL HIGH (ref 12.0–15.0)
LYMPHS ABS: 1.6 10*3/uL (ref 0.7–4.0)
LYMPHS PCT: 15.6 % (ref 12.0–46.0)
MCHC: 33.8 g/dL (ref 30.0–36.0)
MCV: 96.7 fl (ref 78.0–100.0)
MONO ABS: 0.5 10*3/uL (ref 0.1–1.0)
Monocytes Relative: 5.3 % (ref 3.0–12.0)
NEUTROS ABS: 7.8 10*3/uL — AB (ref 1.4–7.7)
Neutrophils Relative %: 78.5 % — ABNORMAL HIGH (ref 43.0–77.0)
Platelets: 268 10*3/uL (ref 150.0–400.0)
RBC: 4.71 Mil/uL (ref 3.87–5.11)
RDW: 13.7 % (ref 11.5–14.6)
WBC: 10 10*3/uL (ref 4.5–10.5)

## 2013-12-26 LAB — VITAMIN B12: Vitamin B-12: 812 pg/mL (ref 211–911)

## 2013-12-26 LAB — TSH: TSH: 1.52 u[IU]/mL (ref 0.35–5.50)

## 2013-12-26 MED ORDER — LISINOPRIL-HYDROCHLOROTHIAZIDE 20-25 MG PO TABS: 1.0000 | ORAL_TABLET | Freq: Every day | ORAL | Status: DC

## 2013-12-26 NOTE — Patient Instructions (Addendum)
Great to see you. We will call you with your lab results and your neurology referral.  I have sent in the new dose of your blood pressure medication.  Calcium supplements have received some bad press lately, with questions that they may increase risk of heart attack or blood clots.  The risk is very low, however none of these risks occur with calcium in FOOD. Try to get most or all of your calcium from your food--aim for 1200 mg/day for women over 13 and men over 70.  To figure out dietary calcium: 300 mg/day from all non dairy foods plus 300 mg per cup of milk, other dairy, or fortified juice.  Non dairy foods that contain calcium:  Kale, oranges, sardines, oatmeal, soy milk/soybeans, salmon, white beans, dried figs, turnip greens, almonds, broccoli, tofu.

## 2013-12-26 NOTE — Assessment & Plan Note (Signed)
Deteriorated.  Will send in rx for higher dose Prinzide. Check electrolytes.

## 2013-12-26 NOTE — Progress Notes (Signed)
Pre visit review using our clinic review tool, if applicable. No additional management support is needed unless otherwise documented below in the visit note. 

## 2013-12-26 NOTE — Assessment & Plan Note (Signed)
Referring to neuro.  She above.

## 2013-12-26 NOTE — Assessment & Plan Note (Signed)
Very wide differential dx. Will check TSH and B12 to rule out deficiency.  Refer to neuro per pt request.  If neuro work up neg, will work up LE vasculature/MSK. The patient indicates understanding of these issues and agrees with the plan.

## 2013-12-26 NOTE — Progress Notes (Signed)
Subjective:   Patient ID: [name], female    DOB: 1945/11/14, 68 y.o.   MRN: [id number]  [name] is a pleasant 68 y.o. year old female who presents to clinic today with Tingling  on 12/26/2013  HPI: Would like to discuss several issues.  1.  HTN- BP has been high for past several weeks- up to 162/99.  She doubled her BP medication on her own and is now normotensive.  Denies any dizziness, HA or blurred vision.  2.  Memory loss- very concerned about this.  Past several months, she feels her short term memory is fading.  She will forget where she placed something or why she walked into a room.  Has never forgotten peoples names or where she lives.  She is concerned "something is wrong with my brain."  She has asked to be referred to neuro, Dr. Janann Colonel.  3.  Tingling- bilateral feet and legs, mainly at night.  Sometimes similar symptoms in left shoulder (h/o left arm fx). No UE or LE weakness.  She sometimes has pain in her hips as well.  She thinks maybe this is because she has been taking too much calcium and she has read that this can cause stroke.  Patient Active Problem List   Diagnosis Date Noted  . Memory loss 12/26/2013  . Tingling 12/26/2013  . Bleeding hemorrhoid 11/07/2013  . Welcome to Medicare preventive visit 09/06/2013  . Lipoma 02/21/2013  . Diarrhea 12/27/2012  . Fracture of humerus, proximal, left, closed 11/16/2012  . Nocturia 04/01/2012  . Muscle pain 04/01/2012  . Sciatica neuralgia 05/19/2011  . HYPERTENSION 08/06/2010  . HIRSUTISM 08/06/2010   Past Medical History  Diagnosis Date  . Hypertension    Past Surgical History  Procedure Laterality Date  . Cesarean section     History  Substance Use Topics  . Smoking status: Never Smoker   . Smokeless tobacco: Not on file  . Alcohol Use: No   Family History  Problem Relation Age of Onset  . Hypertension Other   . Cancer Other     ovarian  . Cancer Mother   . Heart disease Father    No  Known Allergies Current Outpatient Prescriptions on File Prior to Visit  Medication Sig Dispense Refill  . Lactobacillus Rhamnosus, GG, (CULTURELLE) CAPS Take by mouth.      . NON FORMULARY Memoral (take two tablet by mouth daily)      . Omega 3-6-9 Fatty Acids (OMEGA 3-6-9 COMPLEX) CAPS Take by mouth.       No current facility-administered medications on file prior to visit.   The PMH, PSH, Social History, Family History, Medications, and allergies have been reviewed in [facility], and have been updated if relevant.   Review of Systems See HPI No recent trauma No facial droop No loss of bladder or bowel control     Objective:    BP 134/76  Pulse 71  Temp(Src) 97.4 F (36.3 C) (Oral)  Ht 5' 2.5" (1.588 m)  Wt 203 lb 12 oz (92.42 kg)  BMI 36.65 kg/m2  SpO2 90%   Physical Exam  Nursing note and vitals reviewed. Constitutional: She is oriented to person, place, and time. She appears well-developed and well-nourished. No distress.  HENT:  Head: Normocephalic and atraumatic.  Eyes: Pupils are equal, round, and reactive to light.  Neck: Normal range of motion.  Musculoskeletal: Normal range of motion.       Left shoulder: Normal. She exhibits normal range of  motion, no tenderness, no deformity and normal strength.  Neurological: She is alert and oriented to person, place, and time. No cranial nerve deficit. She exhibits normal muscle tone. Coordination normal.  Psychiatric:  Tearful, moderately anxious          Assessment & Plan:   HYPERTENSION - Plan: Comprehensive metabolic panel, TSH, Vitamin B12, CBC with Differential  Memory loss - Plan: Comprehensive metabolic panel, TSH, Vitamin B12, CBC with Differential, Ambulatory referral to Neurology  Tingling - Plan: Comprehensive metabolic panel, TSH, Vitamin B12, CBC with Differential, Ambulatory referral to Neurology Return in about 1 month (around 01/25/2014) for 30 minute follow up.

## 2013-12-27 ENCOUNTER — Telehealth: Payer: Self-pay | Admitting: Family Medicine

## 2013-12-27 NOTE — Telephone Encounter (Signed)
Relevant patient education assigned to patient using Emmi. ° °

## 2014-01-05 ENCOUNTER — Telehealth: Payer: Self-pay

## 2014-01-05 NOTE — Telephone Encounter (Signed)
Tonya with [facility] left v/m requesting 12/26/13 office note, labs and 09/06/13 EKG faxed to [phone] (verified fax). done

## 2014-01-09 ENCOUNTER — Encounter: Payer: Self-pay | Admitting: Neurology

## 2014-01-09 ENCOUNTER — Ambulatory Visit (INDEPENDENT_AMBULATORY_CARE_PROVIDER_SITE_OTHER): Payer: Commercial Managed Care - HMO | Admitting: Neurology

## 2014-01-09 VITALS — BP 172/99 | HR 66 | Ht 63.0 in | Wt 204.0 lb

## 2014-01-09 DIAGNOSIS — R4189 Other symptoms and signs involving cognitive functions and awareness: Secondary | ICD-10-CM

## 2014-01-09 DIAGNOSIS — F09 Unspecified mental disorder due to known physiological condition: Secondary | ICD-10-CM

## 2014-01-09 DIAGNOSIS — G609 Hereditary and idiopathic neuropathy, unspecified: Secondary | ICD-10-CM

## 2014-01-09 NOTE — Patient Instructions (Signed)
Overall you are doing fairly well but I do want to suggest a few things today:   Remember to drink plenty of fluid, eat healthy meals and do not skip any meals. Try to eat protein with a every meal and eat a healthy snack such as fruit or nuts in between meals. Try to keep a regular sleep-wake schedule and try to exercise daily, particularly in the form of walking, 20-30 minutes a day, if you can.   As far as diagnostic testing:  1)I would like you to have some blood work completed today to check for causes of your symptoms  Depression can cause a lot of memory issues. If the blood work is unremarkable I think you may benefit from treatment for your depression  I would like to see you back as needed. Please call us with any interim questions, concerns, problems, updates or refill requests.   My clinical assistant and will answer any of your questions and relay your messages to me and also relay most of my messages to you.   Our phone number is [phone]. We also have an after hours call service for urgent matters and there is a physician on-call for urgent questions. For any emergencies you know to call 911 or go to the nearest emergency room

## 2014-01-09 NOTE — Progress Notes (Signed)
[facility]    Provider:  Dr Janann Colonel Referring Provider: Lucille Passy, MD Primary Care Physician:  Arnette Norris, MD  CC:  Memory loss and LE paresthesias  HPI:  [name] is a 68 y.o. female here as a referral from Dr. Deborra Medina for memory loss and extremity paresthesias  For the past few months she has noted difficulty with her short term memory, feels that it is getting progressively worse. Notes difficulty recalling names, why she walked into a room. Remote memory is mainly intact. Takes memoral, which she feels has helped.   Also notes difficulty with bilateral LE paresthesias, worse at night. Described as a pins and needles type sensation and a gripping sensation. Mainly distal to the knee bilaterally. Had recent blood work done by Dr Deborra Medina with normal B12 and TSH. Recently noted paresthesias in her upper extremities. Described as a sensation of "ants crawling on her" from the left shoulder up to the occipital region. Reports an injury to her left shoulder and arm last year.   Has history of HTN, which is poorly controlled. No known strokes or TIAs. Reports sleeping well but does report some snoring, unclear if any apnea events as she lives along. Notes her mood varies, appears to have some severe depression. She reports some deep questions about Christianity. Broke down crying multiple times during the office visit. Denies any suicidal ideation.   Review of Systems: Out of a complete 14 system review, the patient complains of only the following symptoms, and all other reviewed systems are negative. + paresthesias, anxiety, depression, fatigue  History   Social History  . Marital Status: Single    Spouse Name: N/A    Number of Children: N/A  . Years of Education: N/A   Occupational History  . Not on file.   Social History Main Topics  . Smoking status: Never Smoker   . Smokeless tobacco: Not on file  . Alcohol Use: No  . Drug Use: No  . Sexual Activity: Not  on file   Other Topics Concern  . Not on file   Social History Narrative   Has a living will.   Would desire CPR.  Would not want prolonged life support if futile.   Son and a friend have a copy.   Single, 4 children   Right handed   Associates degree   1 cup daily    Family History  Problem Relation Age of Onset  . Hypertension Other   . Cancer Other     ovarian  . Cancer Mother   . Heart disease Father     Past Medical History  Diagnosis Date  . Hypertension     Past Surgical History  Procedure Laterality Date  . Cesarean section      Current Outpatient Prescriptions  Medication Sig Dispense Refill  . Lactobacillus Rhamnosus, GG, (CULTURELLE) CAPS Take by mouth.      Marland Kitchen lisinopril-hydrochlorothiazide (PRINZIDE,ZESTORETIC) 20-25 MG per tablet Take 1 tablet by mouth daily.  90 tablet  3  . Multiple Vitamins-Minerals (MULTIVITAMIN WITH MINERALS) tablet Take 1 tablet by mouth daily.      . NON FORMULARY Memoral (take two tablet by mouth daily)      . Omega 3-6-9 Fatty Acids (OMEGA 3-6-9 COMPLEX) CAPS Take by mouth.      . Pyridoxine HCl (B-6 PO) Take by mouth.       No current facility-administered medications for this visit.    Allergies as of 01/09/2014  . (  No Known Allergies)    Vitals: BP 172/99  Pulse 66  Ht 5\' 3"  (1.6 m)  Wt 204 lb (92.534 kg)  BMI 36.15 kg/m2 Last Weight:  Wt Readings from Last 1 Encounters:  01/09/14 204 lb (92.534 kg)   Last Height:   Ht Readings from Last 1 Encounters:  01/09/14 5\' 3"  (1.6 m)     Physical exam: Exam: Gen: NAD, conversant Eyes: anicteric sclerae, moist conjunctivae HENT: Atraumatic, oropharynx clear Neck: Trachea midline; supple,  Lungs: CTA, no wheezing, rales, rhonic                          CV: RRR, no MRG Abdomen: Soft, non-tender;  Extremities: No peripheral edema  Skin: Normal temperature, no rash,  Psych: Appropriate affect, pleasant  Neuro: MS:  MOCA 26/30  CN: PERRL, EOMI no nystagmus,  no ptosis, sensation intact to LT V1-V3 bilat, face symmetric, no weakness, hearing grossly intact, palate elevates symmetrically, shoulder shrug 5/5 bilat,  tongue protrudes midline, no fasiculations noted.  Motor: normal bulk and tone Strength: 5/5  In all extremities  Coord: rapid alternating and point-to-point (FNF, HTS) movements intact.  Reflexes: symmetrical, bilat downgoing toes  Sens: mild decrease to PP and vibration to mid shin in bilateral LE  Gait: posture, stance, stride and arm-swing normal. Tandem gait intact. Able to walk on heels and toes. Romberg absent.   Assessment:  After physical and neurologic examination, review of laboratory studies, imaging, neurophysiology testing and pre-existing records, assessment will be reviewed on the problem list.  Plan:  Treatment plan and additional workup will be reviewed under Problem List.  1)Cognitive decline 2)paresthesias 3)depression/anxiety  67y/o woman presenting for initial evaluation of history of cognitive decline and paresthesias. Physical exam pertinent for signs of a lower extremity peripheral neuropathy and a MOCA score of 26/30. Of note, patient appears very anxious/depressed, breaking down and crying multiple times during the office visit. Unclear etiology of her symptoms, will check lab workup for possible metabolic causes. Suspect that depression is playing a large role in her overall picture. Will consider trial of antidepressant in the future. She expressed a desire to connect with a pastor and local church, I offered options on ways to connect with a church and she requested someone call her. Will work with SW to find her a connection. Will follow up once blood work completed.   Jim Like, DO  [facility] [address] [place], [place] [address]  Phone [phone] Fax [phone]

## 2014-01-10 ENCOUNTER — Telehealth: Payer: Self-pay | Admitting: Neurology

## 2014-01-10 ENCOUNTER — Other Ambulatory Visit: Payer: Self-pay | Admitting: Neurology

## 2014-01-10 DIAGNOSIS — R202 Paresthesia of skin: Secondary | ICD-10-CM

## 2014-01-10 LAB — SPECIMEN STATUS REPORT

## 2014-01-10 NOTE — Progress Notes (Signed)
Quick Note:  Informed patient of elevated blood sugar levels, explained that this could be the cause of some of the sensations she is feeling, instructed patient to follow up with pcp for better control of this, patient would like to know what is the next step in her care of treatment. ______

## 2014-01-10 NOTE — Telephone Encounter (Signed)
Note opened in error.

## 2014-01-11 ENCOUNTER — Telehealth: Payer: Self-pay

## 2014-01-11 NOTE — Telephone Encounter (Signed)
Pt left v/m requesting copy of 12/26/13 labs mailed to pts home address. Done.

## 2014-01-11 NOTE — Progress Notes (Signed)
Quick Note:  Spoke with patient informed, her that per Dr. Janann Colonel he would like for her to have an EMG/NCS, and informed her that she will be contacted to schedule this appointment, patient requested information on the test, sent information in the mail. ______

## 2014-01-12 ENCOUNTER — Other Ambulatory Visit (INDEPENDENT_AMBULATORY_CARE_PROVIDER_SITE_OTHER): Payer: Self-pay | Admitting: General Surgery

## 2014-01-12 DIAGNOSIS — K644 Residual hemorrhoidal skin tags: Secondary | ICD-10-CM

## 2014-01-12 DIAGNOSIS — K648 Other hemorrhoids: Secondary | ICD-10-CM

## 2014-01-12 LAB — HEMOGLOBIN A1C
ESTIMATED AVERAGE GLUCOSE: 134 mg/dL
HEMOGLOBIN A1C: 6.3 % — AB (ref 4.8–5.6)

## 2014-01-12 LAB — PROTEIN ELECTROPHORESIS
A/G Ratio: 1.4 (ref 0.7–2.0)
ALPHA 2: 0.7 g/dL (ref 0.4–1.2)
Albumin ELP: 3.7 g/dL (ref 3.2–5.6)
Alpha 1: 0.2 g/dL (ref 0.1–0.4)
Beta: 1 g/dL (ref 0.6–1.3)
GLOBULIN, TOTAL: 2.7 g/dL (ref 2.0–4.5)
Gamma Globulin: 0.8 g/dL (ref 0.5–1.6)
TOTAL PROTEIN: 6.4 g/dL (ref 6.0–8.5)

## 2014-01-12 LAB — VITAMIN B1: VITAMIN B1 (THIAMINE): 38.2 nmol/L — AB (ref 8.1–32.9)

## 2014-01-12 LAB — SPECIMEN STATUS REPORT

## 2014-01-12 LAB — VITAMIN B1, WHOLE BLOOD: THIAMINE: 160.9 nmol/L (ref 66.5–200.0)

## 2014-01-12 LAB — VITAMIN B6: Vitamin B6: 75.4 ug/L — ABNORMAL HIGH (ref 2.0–32.8)

## 2014-01-13 ENCOUNTER — Telehealth: Payer: Self-pay

## 2014-01-13 NOTE — Telephone Encounter (Signed)
Pt left v/m; Dr Janann Colonel advised pt to contact Dr Deborra Medina that Hgb A1C was elevated;(Dr Janann Colonel result note under lab tab).Pt wants to know what to do?

## 2014-01-13 NOTE — Telephone Encounter (Signed)
Reviewed results.  A1c is a little high (not yet diabetes though).  I would like for her to come in to discuss tx.

## 2014-01-13 NOTE — Progress Notes (Signed)
Quick Note:  I called pt and relayed message about B6 level and she is to stop taking. She verbalized understanding. ______

## 2014-01-13 NOTE — Telephone Encounter (Signed)
Pt left v/m returning call and request cb at [phone].

## 2014-01-13 NOTE — Telephone Encounter (Signed)
Lm on pts vm requesting a call back 

## 2014-01-13 NOTE — Telephone Encounter (Signed)
Spoke to pt and informed her of results. Pt states that she will contact office to schedule appt after speaking with insurance co to verify coverage

## 2014-01-16 ENCOUNTER — Telehealth: Payer: Self-pay | Admitting: Neurology

## 2014-01-16 NOTE — Telephone Encounter (Signed)
Patient calling to state that she has some bruises all over her after her procedure that was done. Please call and advise patient.

## 2014-01-16 NOTE — Telephone Encounter (Signed)
Spoke with patient and she said that it was a hemorroid surgery that was done on Thursday, apolized had wrong office, was intending to Dentist at [facility] surgery

## 2014-01-20 ENCOUNTER — Encounter: Payer: Self-pay | Admitting: Family Medicine

## 2014-01-20 ENCOUNTER — Ambulatory Visit (INDEPENDENT_AMBULATORY_CARE_PROVIDER_SITE_OTHER): Payer: Commercial Managed Care - HMO | Admitting: Family Medicine

## 2014-01-20 VITALS — BP 140/80 | HR 56 | Temp 97.9°F | Wt 201.8 lb

## 2014-01-20 DIAGNOSIS — R209 Unspecified disturbances of skin sensation: Secondary | ICD-10-CM

## 2014-01-20 DIAGNOSIS — F39 Unspecified mood [affective] disorder: Secondary | ICD-10-CM | POA: Insufficient documentation

## 2014-01-20 DIAGNOSIS — F3289 Other specified depressive episodes: Secondary | ICD-10-CM

## 2014-01-20 DIAGNOSIS — R7309 Other abnormal glucose: Secondary | ICD-10-CM

## 2014-01-20 DIAGNOSIS — E1140 Type 2 diabetes mellitus with diabetic neuropathy, unspecified: Secondary | ICD-10-CM | POA: Insufficient documentation

## 2014-01-20 DIAGNOSIS — R202 Paresthesia of skin: Secondary | ICD-10-CM

## 2014-01-20 DIAGNOSIS — F32A Depression, unspecified: Secondary | ICD-10-CM

## 2014-01-20 DIAGNOSIS — F329 Major depressive disorder, single episode, unspecified: Secondary | ICD-10-CM

## 2014-01-20 MED ORDER — METFORMIN HCL 500 MG PO TABS: 500.0000 mg | ORAL_TABLET | Freq: Every day | ORAL | Status: DC

## 2014-01-20 NOTE — Progress Notes (Signed)
   Subjective:   Patient ID: [name], female    DOB: 20-Sep-1945, 68 y.o.   MRN: [id number]  [name] is a pleasant 68 y.o. year old female who presents to clinic today with Follow-up  on 01/20/2014  HPI: Here for follow up from labs done by neurology. I referred her to neurology for for memory loss and extremity paresthesias.  She saw Dr. Janann Colonel on 01/09/2014.  Note reviewed. Did blood work- a1c a little elevated.  Advised her to follow up with me for this.  No family history of diabetes.  No increased thirst or urination. Lab Results  Component Value Date   HGBA1C 6.3* 01/09/2014   Continues to have paresthesias.  He was also concerned that perhaps some her symptoms were being exacerbated by depression.  She has refused antidepressants in past.  She preferred talking with a pastor or someone in a church.  She feels she is not depressed she is just sad about state of the world- "our country has gone down hill since we have allowed abortion."  Denies feeling SI or HI. Sleeping well.   Patient Active Problem List   Diagnosis Date Noted  . Elevated hemoglobin A1c 01/20/2014  . Depression 01/20/2014  . Memory loss 12/26/2013  . Tingling 12/26/2013  . Bleeding hemorrhoid 11/07/2013  . Welcome to Medicare preventive visit 09/06/2013  . Lipoma 02/21/2013  . Diarrhea 12/27/2012  . Fracture of humerus, proximal, left, closed 11/16/2012  . Nocturia 04/01/2012  . Muscle pain 04/01/2012  . Sciatica neuralgia 05/19/2011  . HYPERTENSION 08/06/2010  . HIRSUTISM 08/06/2010   Past Medical History  Diagnosis Date  . Hypertension    Past Surgical History  Procedure Laterality Date  . Cesarean section     History  Substance Use Topics  . Smoking status: Never Smoker   . Smokeless tobacco: Not on file  . Alcohol Use: No   Family History  Problem Relation Age of Onset  . Hypertension Other   . Cancer Other     ovarian  . Cancer Mother   . Heart disease Father    No Known  Allergies Current Outpatient Prescriptions on File Prior to Visit  Medication Sig Dispense Refill  . Lactobacillus Rhamnosus, GG, (CULTURELLE) CAPS Take by mouth.      Marland Kitchen lisinopril-hydrochlorothiazide (PRINZIDE,ZESTORETIC) 20-25 MG per tablet Take 1 tablet by mouth daily.  90 tablet  3  . Multiple Vitamins-Minerals (MULTIVITAMIN WITH MINERALS) tablet Take 1 tablet by mouth daily.      . NON FORMULARY Memoral (take two tablet by mouth daily)      . Omega 3-6-9 Fatty Acids (OMEGA 3-6-9 COMPLEX) CAPS Take by mouth.       No current facility-administered medications on file prior to visit.   The PMH, PSH, Social History, Family History, Medications, and allergies have been reviewed in [facility], and have been updated if relevant.      Review of Systems See HPI    Objective:    BP 140/80  Pulse 56  Temp(Src) 97.9 F (36.6 C) (Oral)  Wt 201 lb 12 oz (91.513 kg)  SpO2 96%   Physical Exam  Gen:  Alert pleasant NAD Psych:  Tearful, moderately anxious today      Assessment & Plan:   Tingling  Elevated hemoglobin A1c  Depression No Follow-up on file.

## 2014-01-20 NOTE — Patient Instructions (Signed)
Great to see you. We are starting Metformin 500 mg daily with breakfast.

## 2014-01-20 NOTE — Progress Notes (Signed)
Pre visit review using our clinic review tool, if applicable. No additional management support is needed unless otherwise documented below in the visit note. 

## 2014-01-20 NOTE — Assessment & Plan Note (Signed)
Followed by Dr. Janann Colonel Having EMG on 5/19.

## 2014-01-20 NOTE — Assessment & Plan Note (Signed)
Start Metformin 500 mg qac. Follow up in 3 months.

## 2014-01-20 NOTE — Assessment & Plan Note (Signed)
Denies feeling depressed. Says she will rely on her faith.

## 2014-01-23 ENCOUNTER — Ambulatory Visit (INDEPENDENT_AMBULATORY_CARE_PROVIDER_SITE_OTHER): Payer: Commercial Managed Care - HMO | Admitting: General Surgery

## 2014-01-23 ENCOUNTER — Encounter (INDEPENDENT_AMBULATORY_CARE_PROVIDER_SITE_OTHER): Payer: Self-pay | Admitting: General Surgery

## 2014-01-23 VITALS — BP 124/82 | HR 78 | Temp 98.0°F | Resp 14 | Ht 62.0 in | Wt 203.2 lb

## 2014-01-23 DIAGNOSIS — K649 Unspecified hemorrhoids: Secondary | ICD-10-CM

## 2014-01-23 MED ORDER — HYDROCORTISONE 1 % EX CREA: TOPICAL_CREAM | CUTANEOUS | Status: DC

## 2014-01-23 NOTE — Patient Instructions (Signed)
Use steroid cream or desitin to perirectal area. Keep clean dry gauze on rectum at all times. Clean with baby wipes. Soak in tub 2-3 times a day

## 2014-01-23 NOTE — Progress Notes (Signed)
Subjective:     Patient ID: [name], female   DOB: [birth year], 68 y.o.   MRN: [id number]  HPI The patient is a 68 year old white female who is about 11 days status post hemorrhoidectomy. She has had some discomfort with bowel movements but it seems to be fairly manageable. She has had some loose bowel movements but has not had constipation. She also notes bruising on her arms and legs that she attributes to the surgery.  Review of Systems     Objective:   Physical Exam On exam she does have a lot of irritation of her perirectal skin. Her operative sites where the hemorrhoidectomy for performed looked relatively clean    Assessment:     The patient is 11 days status post hemorrhoidectomy x2     Plan:     At this point I think she could benefit from having a barrier cream on the perirectal skin and keeping clean dry gauze talked on the rectum to minimize any contamination to the skin. She needs to continue to work on local wound care. We will plan to see her back in about a month to check her progress.

## 2014-01-24 ENCOUNTER — Ambulatory Visit (INDEPENDENT_AMBULATORY_CARE_PROVIDER_SITE_OTHER): Payer: Commercial Managed Care - HMO | Admitting: Diagnostic Neuroimaging

## 2014-01-24 ENCOUNTER — Encounter (INDEPENDENT_AMBULATORY_CARE_PROVIDER_SITE_OTHER): Payer: Commercial Managed Care - HMO | Admitting: General Surgery

## 2014-01-24 ENCOUNTER — Encounter (INDEPENDENT_AMBULATORY_CARE_PROVIDER_SITE_OTHER): Payer: Self-pay | Admitting: Radiology

## 2014-01-24 DIAGNOSIS — R202 Paresthesia of skin: Secondary | ICD-10-CM

## 2014-01-24 DIAGNOSIS — Z0289 Encounter for other administrative examinations: Secondary | ICD-10-CM

## 2014-01-24 DIAGNOSIS — R209 Unspecified disturbances of skin sensation: Secondary | ICD-10-CM

## 2014-01-25 ENCOUNTER — Telehealth: Payer: Self-pay

## 2014-01-25 NOTE — Telephone Encounter (Signed)
Pt said BS running higher than it should be; pt does not remember what BS running. Pt still getting tingling feelings in legs and neck and shoulder and pt wants to know if could be a magnesium deficiency. Pt was wondering if needs to have labs to test magnesium level.Pt request cb. [place] Pt said tomorrow is OK for cb.

## 2014-01-25 NOTE — Telephone Encounter (Signed)
Please call to get more information.  I am not understanding what she is asking about her blood sugar.

## 2014-01-26 NOTE — Telephone Encounter (Signed)
Lm on pts vm requesting a call back 

## 2014-01-26 NOTE — Procedures (Signed)
   [facility]  NCS (NERVE CONDUCTION STUDY) WITH EMG (ELECTROMYOGRAPHY) REPORT   STUDY DATE: 01/24/14 PATIENT NAME: [name] DOB: 04-11-1946 MRN: [id number]  ORDERING CLINICIAN: Jim Like, DO   TECHNOLOGIST: Towana Badger  ELECTROMYOGRAPHER: Earlean Polka. Cataleah Stites, MD  CLINICAL INFORMATION: 68 year old female with upper and lower extremity numbness.  FINDINGS: NERVE CONDUCTION STUDY: Left median, left ulnar, bilateral peroneal and bilateral tibial motor responses and F-wave latencies are normal. Left median, left ulnar, bilateral sural sensory responses are normal. Accessory left peroneal nerve noted on motor nerve stimulation.  NEEDLE ELECTROMYOGRAPHY: Right lower extremity vastus medialis, tibialis anterior, gastrocnemius shows no abnormal spontaneous activity at rest and normal motor unit recruitment on exertion.  IMPRESSION:  This is a normal study. No electrodiagnostic evidence of large fiber neuropathy at this time.   INTERPRETING PHYSICIAN:  Penni Bombard, MD Certified in Neurology, Neurophysiology and Neuroimaging  [facility] [name], [place], [place] [address] [phone]

## 2014-01-27 ENCOUNTER — Other Ambulatory Visit: Payer: Self-pay | Admitting: Neurology

## 2014-01-27 DIAGNOSIS — R4189 Other symptoms and signs involving cognitive functions and awareness: Secondary | ICD-10-CM

## 2014-01-27 NOTE — Telephone Encounter (Signed)
Spoke to pt is states that she has been feeling irritable lately and "googled" it and it stated her magnesium may be low, and should she be taking a supplement?

## 2014-01-27 NOTE — Progress Notes (Signed)
Quick Note:  Called patient informed her of normal EEG, patient expressed understanding, and questions what are her next steps in care. ______

## 2014-01-27 NOTE — Telephone Encounter (Signed)
Lm on pts vm requesting a call back 

## 2014-01-27 NOTE — Telephone Encounter (Signed)
No please do not start a supplement  We can check her mag level - its a simple blood test.  If it is low, then we can replete it.

## 2014-01-27 NOTE — Telephone Encounter (Signed)
Spoke to pt and advised per Dr Deborra Medina. Pt is persistent is wanting to take it to see if her s/s change. She is not wanting to have labs drawn as she "would just be spending more money." I advised her that as her PCP, she has been advised to not take supplement without having labs to determine actual levels. Explained to pt that s/s could be caused by another factor, and not mag levels and I reinnerated to her that she does NOT need to begin taking OTC mag.

## 2014-01-31 ENCOUNTER — Other Ambulatory Visit (INDEPENDENT_AMBULATORY_CARE_PROVIDER_SITE_OTHER): Payer: Commercial Managed Care - HMO

## 2014-01-31 ENCOUNTER — Other Ambulatory Visit: Payer: Self-pay | Admitting: Family Medicine

## 2014-01-31 DIAGNOSIS — R5381 Other malaise: Secondary | ICD-10-CM

## 2014-01-31 DIAGNOSIS — R5383 Other fatigue: Principal | ICD-10-CM

## 2014-01-31 LAB — MAGNESIUM: Magnesium: 2.1 mg/dL (ref 1.5–2.5)

## 2014-01-31 NOTE — Progress Notes (Signed)
Quick Note:  Informed patient that she has been referred over to neuro-psychology for formal memory testing and she will be contacted by them with appointment information. ______

## 2014-02-10 ENCOUNTER — Telehealth: Payer: Self-pay

## 2014-02-10 NOTE — Telephone Encounter (Signed)
Pt left v/m; pt thinks metformin is causing weakness and fatigue and dizziness on and off. Symptoms started after starting metformin but symptoms listed have gradually worsened. Pt does not ck BS.[facility] [address]

## 2014-02-10 NOTE — Telephone Encounter (Signed)
Let's hold metformin for next two days to see if this will help with her symptoms.  Call us on Monday with an update.  If improved, will start another oral rx like glyburide.

## 2014-02-10 NOTE — Telephone Encounter (Signed)
Spoke to pt and advised per Dr Deborra Medina. Pt verbally expressed understanding and states that she will contact us with update next wk

## 2014-02-14 MED ORDER — GLIPIZIDE 2.5 MG HALF TABLET: 2.5000 mg | ORAL_TABLET | Freq: Two times a day (BID) | ORAL | Status: DC

## 2014-02-14 NOTE — Addendum Note (Signed)
Addended by: Modena Nunnery on: 02/14/2014 10:51 AM   Modules accepted: Orders

## 2014-02-14 NOTE — Telephone Encounter (Signed)
Spoke to pt and informed her of results and instruction. Pt given a sample meter to check BS daily and will take to pharmacy when picking up Rx on use/ instruction

## 2014-02-14 NOTE — Telephone Encounter (Signed)
Pt left v/m; pt stopped metformin on 02/10/14 and pt said she feels better since stopped taking the metformin, Please advise.

## 2014-02-14 NOTE — Addendum Note (Signed)
Addended by: Lucille Passy on: 02/14/2014 10:31 AM   Modules accepted: Orders, Medications

## 2014-02-14 NOTE — Telephone Encounter (Signed)
Noted.  Rx for glipizide sent to pharmacy, metformin removed from med list.  Please check blood sugars regularly, watch for low blood sugar and call us with an update.

## 2014-02-17 ENCOUNTER — Telehealth: Payer: Self-pay

## 2014-02-17 MED ORDER — BAYER MICROLET LANCETS MISC: Status: DC

## 2014-02-17 MED ORDER — GLUCOSE BLOOD VI STRP: ORAL_STRIP | Status: DC

## 2014-02-17 NOTE — Telephone Encounter (Signed)
Noted.  Ok to send rx as pt requests

## 2014-02-17 NOTE — Telephone Encounter (Signed)
Pt received Contour Next meter given on 02/14/14; pt request diabetic test strips and lancets for contour next meter to [place].Please advise. Pt has been cking BS on 02/15/14 FBS 113;  02/15/14 BS after lunch was 85;  02/16/14 FBS 116 and BS checked 2 hr after lunch was 120. 02/17/14 FBS 108.

## 2014-02-23 ENCOUNTER — Telehealth: Payer: Self-pay

## 2014-02-23 NOTE — Telephone Encounter (Signed)
Pt left v/m wanting to get PCP permission to take supplement to reduce belly fat called Forskolin. Pt request cb.

## 2014-02-23 NOTE — Telephone Encounter (Signed)
I recommend against it as it can lower BP and she is already on meds. Will await PCP's recs - ok to wait for her.

## 2014-02-24 NOTE — Telephone Encounter (Signed)
Patient notified

## 2014-02-24 NOTE — Telephone Encounter (Signed)
I agree with Dr. Darnell Level.  I would not recommend this.

## 2014-02-28 ENCOUNTER — Encounter (INDEPENDENT_AMBULATORY_CARE_PROVIDER_SITE_OTHER): Payer: Self-pay | Admitting: General Surgery

## 2014-02-28 ENCOUNTER — Ambulatory Visit (INDEPENDENT_AMBULATORY_CARE_PROVIDER_SITE_OTHER): Payer: Commercial Managed Care - HMO | Admitting: General Surgery

## 2014-02-28 VITALS — BP 130/78 | HR 84 | Temp 97.0°F | Ht 62.0 in | Wt 201.0 lb

## 2014-02-28 DIAGNOSIS — K649 Unspecified hemorrhoids: Secondary | ICD-10-CM

## 2014-02-28 NOTE — Progress Notes (Signed)
Subjective:     Patient ID: Grayling Congress, female   DOB: March 15, 1946, 68 y.o.   MRN: [id number]  HPI The patient is a 68 year old white female who is 6 weeks status post hemorrhoidectomy. She denies any rectal pain. She has had a couple episodes of needing to go have a bowel movement urgently and her bowels have been loose. She has not had any accidents.  Review of Systems     Objective:   Physical Exam On exam her perirectal skin looks good. She has some small external hemorrhoidal skin tags that are benign appearing. She has good rectal tone on digital exam.    Assessment:     The patient is 6 weeks status post hemorrhoidectomy     Plan:     At this point she may return to her normal activities without restriction. I will plan to see her back on a when necessary basis. She will use probiotics to help with her loose stools. If this does not resolve then I'll refer her to a gastroenterologist.

## 2014-02-28 NOTE — Patient Instructions (Signed)
Use florastor for probiotic

## 2014-03-16 ENCOUNTER — Telehealth: Payer: Self-pay

## 2014-03-16 NOTE — Telephone Encounter (Signed)
Yes she should be taking glipizide 5 mg - 1/2 tab twice daily.

## 2014-03-16 NOTE — Telephone Encounter (Signed)
Spoke to pt and advised per Dr Aron. Pt verbally expressed understanding.  

## 2014-03-16 NOTE — Telephone Encounter (Signed)
Pt wants Dr Hulen Shouts opinion; pt started glipizide 5 mg taking 1/2 tab bid before meals on 02/14/14. Pt said average FBS is between 102-120. Medication list has glipizide 2.5 mg taking 1/2 tab(2.5 mg total) bid before meals. Spoke with [place] at [facility]. Pt was given Glipizide 5 mg; Maggie Font said plain glipizide does not come in 2.5 mg only glipizide ER comes in 2.5 mg. Should pt continue glipizide 5 mg? Pt states she feels fine but tingling in lower legs,feet and shoulders is the same.pt request cb.[place]

## 2014-04-05 ENCOUNTER — Ambulatory Visit (INDEPENDENT_AMBULATORY_CARE_PROVIDER_SITE_OTHER): Payer: Commercial Managed Care - HMO | Admitting: Family Medicine

## 2014-04-05 ENCOUNTER — Encounter: Payer: Self-pay | Admitting: Family Medicine

## 2014-04-05 NOTE — Progress Notes (Signed)
Pre visit review using our clinic review tool, if applicable. No additional management support is needed unless otherwise documented below in the visit note. 

## 2014-04-05 NOTE — Assessment & Plan Note (Addendum)
>  25 minutes spent in face to face time with patient, >50% spent in counselling or coordination of care I will not prescribe appetite suppressants to her since she does have h/o HTN.  I did suggest referral to nutritionist and she agreed- referral placed.  Per pt, [employer] will pay for [employer] if I give her an rx. She will [employer] to find out exactly what I need to do.

## 2014-04-05 NOTE — Progress Notes (Signed)
   Subjective:   Patient ID: [name], female    DOB: Nov 15, 1945, 68 y.o.   MRN: [id number]  [name] is a pleasant 68 y.o. year old female who presents to clinic today with Weight Loss  on 04/05/2014  HPI: Obesity- BMI 36.11.  She has been trying to lose weight on her own to help with her back.  Her friend was prescribed weight loss medication and she would like me to prescribe some for her.   Wt Readings from Last 3 Encounters:  04/05/14 199 lb (90.266 kg)  02/28/14 201 lb (91.173 kg)  01/23/14 203 lb 3.2 oz (92.171 kg)   Current Outpatient Prescriptions on File Prior to Visit  Medication Sig Dispense Refill  . BAYER MICROLET LANCETS lancets Use as instructed  100 each  12  . glipiZIDE (GLUCOTROL) 2.5 mg TABS tablet Take 0.5 tablets (2.5 mg total) by mouth 2 (two) times daily before a meal.  60 tablet  3  . glucose blood (BAYER CONTOUR NEXT TEST) test strip Use to check blood sugar once daily Dx 250.00  100 each  12  . lisinopril-hydrochlorothiazide (PRINZIDE,ZESTORETIC) 20-25 MG per tablet Take 1 tablet by mouth daily.  90 tablet  3  . Multiple Vitamins-Minerals (MULTIVITAMIN WITH MINERALS) tablet Take 1 tablet by mouth daily.      . NON FORMULARY Memoral (take two tablet by mouth daily)      . Omega 3-6-9 Fatty Acids (OMEGA 3-6-9 COMPLEX) CAPS Take by mouth. Take 3 tabs daily       No current facility-administered medications on file prior to visit.    No Known Allergies  Past Medical History  Diagnosis Date  . Hypertension     Past Surgical History  Procedure Laterality Date  . Cesarean section      Family History  Problem Relation Age of Onset  . Hypertension Other   . Cancer Other     ovarian  . Cancer Mother   . Heart disease Father     History   Social History  . Marital Status: Single    Spouse Name: N/A    Number of Children: N/A  . Years of Education: N/A   Occupational History  . Not on file.   Social History Main Topics  . Smoking  status: Never Smoker   . Smokeless tobacco: Not on file  . Alcohol Use: No  . Drug Use: No  . Sexual Activity: Not on file   Other Topics Concern  . Not on file   Social History Narrative   Has a living will.   Would desire CPR.  Would not want prolonged life support if futile.   Son and a friend have a copy.   Single, 4 children   Right handed   Associates degree   1 cup daily   The PMH, PSH, Social History, Family History, Medications, and allergies have been reviewed in [facility] and have been updated if relevant.  Review of Systems See HPI    Objective:    BP 128/78  Pulse 76  Temp(Src) 97.8 F (36.6 C) (Oral)  Ht 5' 2.25" (1.581 m)  Wt 199 lb (90.266 kg)  BMI 36.11 kg/m2  SpO2 96%   Physical Exam Gen:  Alert, pleasant, NAD Psych: good eye contact, not anxious or depressed appearing       Assessment & Plan:   Morbid obesity No Follow-up on file.

## 2014-04-05 NOTE — Patient Instructions (Signed)
Great to see you. Please stop by to see Rosaria Ferries on your way out to set up your referral to nutritionist.

## 2014-04-28 ENCOUNTER — Ambulatory Visit (INDEPENDENT_AMBULATORY_CARE_PROVIDER_SITE_OTHER): Payer: Commercial Managed Care - HMO | Admitting: Family Medicine

## 2014-04-28 ENCOUNTER — Encounter: Payer: Self-pay | Admitting: Family Medicine

## 2014-04-28 VITALS — BP 130/78 | HR 65 | Temp 97.5°F | Wt 197.0 lb

## 2014-04-28 DIAGNOSIS — F32A Depression, unspecified: Secondary | ICD-10-CM

## 2014-04-28 DIAGNOSIS — R197 Diarrhea, unspecified: Secondary | ICD-10-CM

## 2014-04-28 DIAGNOSIS — F329 Major depressive disorder, single episode, unspecified: Secondary | ICD-10-CM

## 2014-04-28 DIAGNOSIS — I10 Essential (primary) hypertension: Secondary | ICD-10-CM

## 2014-04-28 DIAGNOSIS — F3289 Other specified depressive episodes: Secondary | ICD-10-CM

## 2014-04-28 DIAGNOSIS — R7309 Other abnormal glucose: Secondary | ICD-10-CM

## 2014-04-28 NOTE — Progress Notes (Signed)
Subjective:   Patient ID: [name], female    DOB: [birth year], 68 y.o.   MRN: [id number]  [name] is a pleasant 68 y.o. year old female who presents to clinic today with her friend [name], who is an Therapist, sports, for 3 month Follow-up  on 04/28/2014  HPI: Elevated blood sugar/prediabetes-  Started Metformin 500 mg with breakfast daily in 01/2014.  She could not tolerate Metformin- she felt it was causing weakness and fatigue- so we started Glipizide 5 mg daily on 02/10/2014.  Checks FSBS daily - usually fasting between 100 and 109. Lab Results  Component Value Date   HGBA1C 6.3* 01/09/2014   Bleeding hemorrhoid- s/p hemorrhoidectomy by Dr. Marlou Starks in 01/2014.  No recurrent symptoms.  Obesity- saw her last month.  She requested appetite suppressants which I refused to prescribe to her given her other co morbidities.  I did refer her to a nutritionist but she could not afford to go.  She feels like "she knows what to do."  She has already lost a few pounds. Wt Readings from Last 3 Encounters:  04/28/14 197 lb (89.359 kg)  04/05/14 199 lb (90.266 kg)  02/28/14 201 lb (91.173 kg)   Persistent diarrhea- has been a chronic issue. Colonoscopy 2011, Dr. Amedeo Plenty.  I advised probiotic, eliminating lactose and possibly gluten and this seemed to help initially.  Past week, it was worse.  Her nurse friend here with her today advised low residue diet and now her symptoms are a little better. No abdominal pain.  No blood or mucous in stool.  No fevers.  Current Outpatient Prescriptions on File Prior to Visit  Medication Sig Dispense Refill  . BAYER MICROLET LANCETS lancets Use as instructed  100 each  12  . Forskolin POWD Take 1 tablet by mouth. Take 1 tab (125mg ) daily      . glipiZIDE (GLUCOTROL) 5 MG tablet Take by mouth. Take 1/2 tab twice daily      . glucose blood (BAYER CONTOUR NEXT TEST) test strip Use to check blood sugar once daily Dx 250.00  100 each  12  . lisinopril-hydrochlorothiazide  (PRINZIDE,ZESTORETIC) 20-25 MG per tablet Take 1 tablet by mouth daily.  90 tablet  3  . magnesium gluconate (MAGONATE) 500 MG tablet Take 500 mg by mouth 2 (two) times daily.      . Multiple Vitamins-Minerals (MULTIVITAMIN WITH MINERALS) tablet Take 1 tablet by mouth daily.      . NON FORMULARY Memoral (take two tablet by mouth daily)      . NON FORMULARY Take 1 tablet by mouth daily. Mineral Rich      . Omega 3-6-9 Fatty Acids (OMEGA 3-6-9 COMPLEX) CAPS Take by mouth. Take 3 tabs daily      . Saccharomyces boulardii (FLORASTOR KIDS) 250 MG PACK Take by mouth.      . TURMERIC PO Take 400 mg by mouth. Take 1 tab three times daily       No current facility-administered medications on file prior to visit.    No Known Allergies  Past Medical History  Diagnosis Date  . Hypertension     Past Surgical History  Procedure Laterality Date  . Cesarean section      Family History  Problem Relation Age of Onset  . Hypertension Other   . Cancer Other     ovarian  . Cancer Mother   . Heart disease Father     History   Social History  . Marital Status: Single  Spouse Name: N/A    Number of Children: N/A  . Years of Education: N/A   Occupational History  . Not on file.   Social History Main Topics  . Smoking status: Never Smoker   . Smokeless tobacco: Not on file  . Alcohol Use: No  . Drug Use: No  . Sexual Activity: Not on file   Other Topics Concern  . Not on file   Social History Narrative   Has a living will.   Would desire CPR.  Would not want prolonged life support if futile.   Son and a friend have a copy.   Single, 4 children   Right handed   Associates degree   1 cup daily   The PMH, PSH, Social History, Family History, Medications, and allergies have been reviewed in [facility], and have been updated if relevant.  Review of Systems    See HPI Denies low blood sugars No HA No blurred vision No LE edema + diarrhea No vomiting Objective:    BP 130/78   Pulse 65  Temp(Src) 97.5 F (36.4 C) (Oral)  Wt 197 lb (89.359 kg)  SpO2 97%   Physical Exam  Nursing note and vitals reviewed. Constitutional: She is oriented to person, place, and time. She appears well-developed and well-nourished. No distress.  HENT:  Head: Normocephalic.  Abdominal: Soft. Bowel sounds are normal. She exhibits no distension and no mass. There is no tenderness. There is no rebound and no guarding.  Neurological: She is alert and oriented to person, place, and time.  Skin: Skin is warm and dry. No erythema.  Psychiatric: She has a normal mood and affect. Her behavior is normal. Thought content normal.          Assessment & Plan:   Diarrhea  Depression  HYPERTENSION  Morbid obesity  Elevated hemoglobin A1c - Plan: Hemoglobin A1c, Comprehensive metabolic panel No Follow-up on file.

## 2014-04-28 NOTE — Assessment & Plan Note (Signed)
Improving. Advised to continue her current diet.

## 2014-04-28 NOTE — Assessment & Plan Note (Signed)
Stable.  Recheck a1c today.

## 2014-04-28 NOTE — Assessment & Plan Note (Signed)
Persistent with intermittent bouts. ?IBS. Advised if symptoms worsen, I will refer her back to GI. She agrees with this plan.

## 2014-04-28 NOTE — Patient Instructions (Signed)
Great to see you. I will call you with your lab results.  If your diarrhea gets worse again, we will send you back to see Dr. Amedeo Plenty.

## 2014-04-28 NOTE — Progress Notes (Signed)
Pre visit review using our clinic review tool, if applicable. No additional management support is needed unless otherwise documented below in the visit note. 

## 2014-04-28 NOTE — Assessment & Plan Note (Signed)
Stable on current dose of Prinzide.  No changes made today.

## 2014-04-29 LAB — COMPREHENSIVE METABOLIC PANEL
ALT: 18 U/L (ref 0–35)
AST: 19 U/L (ref 0–37)
Albumin: 3.9 g/dL (ref 3.5–5.2)
Alkaline Phosphatase: 51 U/L (ref 39–117)
BUN: 25 mg/dL — ABNORMAL HIGH (ref 6–23)
CALCIUM: 9.3 mg/dL (ref 8.4–10.5)
CHLORIDE: 102 meq/L (ref 96–112)
CO2: 28 meq/L (ref 19–32)
CREATININE: 0.9 mg/dL (ref 0.4–1.2)
GFR: 63.32 mL/min (ref >60.00)
GLUCOSE: 66 mg/dL — AB (ref 70–99)
Potassium: 4.2 mEq/L (ref 3.5–5.1)
Sodium: 138 mEq/L (ref 135–145)
Total Bilirubin: 0.6 mg/dL (ref 0.2–1.2)
Total Protein: 6.6 g/dL (ref 6.0–8.3)

## 2014-04-29 LAB — HEMOGLOBIN A1C: HEMOGLOBIN A1C: 5.7 % (ref 4.6–6.5)

## 2014-05-01 ENCOUNTER — Encounter: Payer: Self-pay | Admitting: *Deleted

## 2014-05-03 ENCOUNTER — Telehealth: Payer: Self-pay | Admitting: Family Medicine

## 2014-05-03 DIAGNOSIS — R197 Diarrhea, unspecified: Secondary | ICD-10-CM

## 2014-05-03 NOTE — Telephone Encounter (Signed)
Referral placed.

## 2014-05-03 NOTE — Telephone Encounter (Signed)
Pt calling today asking for a referral to Dr Amedeo Plenty for GI problems, stomach issues. Thank you

## 2014-05-05 ENCOUNTER — Telehealth: Payer: Self-pay

## 2014-05-05 NOTE — Telephone Encounter (Signed)
I left a message on pt's voicemail ( as authorized per [facility]) that Dr. Deborra Medina is out of the office until 9/2, and that Dr. Glori Bickers advised that she hold the glipizide until she hears from our office upon Dr. Hulen Shouts return next week.

## 2014-05-05 NOTE — Telephone Encounter (Signed)
Pt left v/m; pt does not think she needs to take glipizide since test results show low glucose. Pt request cb explaining why pt should continue taking glipizide.

## 2014-05-05 NOTE — Telephone Encounter (Signed)
Please have her hold it until Dr Deborra Medina can review mess

## 2014-05-08 NOTE — Telephone Encounter (Signed)
Spoke to pt and advised per Dr Deborra Medina; pt verbally expressed understanding. Diet mailed as requested

## 2014-05-08 NOTE — Telephone Encounter (Signed)
If she feels she can control blood sugar with diet alone, ok to d/c glipizide.  If you see not from Dr. Janann Colonel who checked her a1c on 5/4- she was a prediabetic at that point.  We can try diet and recheck a1c in 3 months.  Please send her copy of eat right diet.  We have already referred her to nutritionist.

## 2014-05-09 NOTE — Telephone Encounter (Signed)
Spoke to pt who states that she has decided to continue taking her glipizide. She states that she will continue to also work on diet, but that it may be best to continue as she does not want to develop diabetes, since she is already borderline.

## 2014-05-09 NOTE — Telephone Encounter (Signed)
Pt left v/m requesting cb; pt is not sure if should continue taking glipizide; pt not sure can control by diet and request cb.

## 2014-05-09 NOTE — Telephone Encounter (Signed)
Pt left v/m requesting cb about taking glipizide.

## 2014-05-10 NOTE — Telephone Encounter (Signed)
Spoke to pt and advised, once again, on directions for continuing or d/c glipizide.

## 2014-05-11 ENCOUNTER — Telehealth: Payer: Self-pay | Admitting: Neurology

## 2014-05-11 NOTE — Telephone Encounter (Signed)
Spoke to patient and she wanted to get information in regards to labs that were done in May 2015.  She asked about glucose, but that wasn't done but relayed her A1c results being 6.3, she also wanted to know if those were fasting labs and I told her she would not have had to fast for the labs that were done.

## 2014-06-14 ENCOUNTER — Telehealth: Payer: Self-pay | Admitting: Family Medicine

## 2014-06-14 NOTE — Telephone Encounter (Signed)
Pt calling in asking if she can switch her PCP to Dr Silvio Pate from Dr Deborra Medina. Is this something you both would be ok with? Thank you

## 2014-06-14 NOTE — Telephone Encounter (Signed)
Yes ok with me

## 2014-06-15 NOTE — Telephone Encounter (Signed)
Okay with me too Have her set up an appt in 4-6 months unless she has immediate issues (she was just in in August)

## 2014-06-28 LAB — HM DIABETES EYE EXAM

## 2014-06-30 ENCOUNTER — Other Ambulatory Visit: Payer: Self-pay | Admitting: *Deleted

## 2014-06-30 MED ORDER — GLIPIZIDE 5 MG PO TABS: ORAL_TABLET | ORAL | Status: DC

## 2014-07-04 ENCOUNTER — Ambulatory Visit (INDEPENDENT_AMBULATORY_CARE_PROVIDER_SITE_OTHER): Payer: Commercial Managed Care - HMO | Admitting: Internal Medicine

## 2014-07-04 ENCOUNTER — Encounter: Payer: Self-pay | Admitting: Internal Medicine

## 2014-07-04 ENCOUNTER — Telehealth: Payer: Self-pay

## 2014-07-04 VITALS — BP 150/88 | HR 81 | Temp 97.7°F | Wt 201.0 lb

## 2014-07-04 DIAGNOSIS — F39 Unspecified mood [affective] disorder: Secondary | ICD-10-CM

## 2014-07-04 DIAGNOSIS — I10 Essential (primary) hypertension: Secondary | ICD-10-CM

## 2014-07-04 DIAGNOSIS — Z23 Encounter for immunization: Secondary | ICD-10-CM

## 2014-07-04 DIAGNOSIS — R7309 Other abnormal glucose: Secondary | ICD-10-CM

## 2014-07-04 DIAGNOSIS — K589 Irritable bowel syndrome without diarrhea: Secondary | ICD-10-CM

## 2014-07-04 MED ORDER — TRETINOIN 0.05 % EX CREA: TOPICAL_CREAM | Freq: Every day | CUTANEOUS | Status: DC

## 2014-07-04 NOTE — Assessment & Plan Note (Signed)
BP Readings from Last 3 Encounters:  07/04/14 150/88  04/28/14 130/78  04/05/14 128/78   Generally okay Nervous with first visit here No change for now

## 2014-07-04 NOTE — Telephone Encounter (Signed)
Pt left v/m; pt was seen 07/04/14 and pt noticed on her paperwork that one of the diagnosis listed was episodic mood disorder and pt said that was not discussed today. Pt request cb.

## 2014-07-04 NOTE — Assessment & Plan Note (Signed)
No persistent problems to require meds for now

## 2014-07-04 NOTE — Patient Instructions (Signed)
Please consider weaning off all your supplements (except the irritable bowel one) by stopping one or two every few weeks and seeing if you miss them.

## 2014-07-04 NOTE — Assessment & Plan Note (Signed)
Not sure if glipizide is great idea for this--but no hypoglycemia for now Discussed lifestyle changes

## 2014-07-04 NOTE — Progress Notes (Signed)
Subjective:    Patient ID: [name], female    DOB: [birth year], 68 y.o.   MRN: [id number]  HPI Here for transfer of care  Started with all her supplements Told her I am not excited about all those supplements She is surprised about this  Discussed the sugars Tolerating the glipizide  Didn't do well with metformin Has 4 meals a day Does silver sneakers twice a week--then just housework and gardening Recent sugars 109-118  IBS diagnosis Has seen Dr Veverly Fells a homeopathic remedy helps before meals Tries to eat small meals  Worried about her circulation Hands, feet and nose get cold Not a new thing--long standing Fingernails will turn blue--but not the whole hand No pain  Has some lumps on fingers recently Not painful  No chest pain  No SOB No dizziness or syncope  Current Outpatient Prescriptions on File Prior to Visit  Medication Sig Dispense Refill  . BAYER MICROLET LANCETS lancets Use as instructed  100 each  12  . glipiZIDE (GLUCOTROL) 5 MG tablet Take 1/2 tab by mouth twice daily  30 tablet  0  . glucose blood (BAYER CONTOUR NEXT TEST) test strip Use to check blood sugar once daily Dx 250.00  100 each  12  . lisinopril-hydrochlorothiazide (PRINZIDE,ZESTORETIC) 20-25 MG per tablet Take 1 tablet by mouth daily.  90 tablet  3  . magnesium gluconate (MAGONATE) 500 MG tablet Take 500 mg by mouth 2 (two) times daily.      . Multiple Vitamins-Minerals (MULTIVITAMIN WITH MINERALS) tablet Take 1 tablet by mouth daily.      . NON FORMULARY Memoral (take two tablet by mouth daily)      . Nutritional Supplements (IBS SUPPORT PO) Take 1 tablet by mouth 3 (three) times daily with meals.      . Omega 3-6-9 Fatty Acids (OMEGA 3-6-9 COMPLEX) CAPS Take by mouth. Take 3 tabs daily      . Saccharomyces boulardii (FLORASTOR KIDS) 250 MG PACK Take by mouth.      . TURMERIC PO Take 400 mg by mouth. Take 1 tab three times daily       No current facility-administered medications  on file prior to visit.    No Known Allergies  Past Medical History  Diagnosis Date  . Hypertension     Past Surgical History  Procedure Laterality Date  . Cesarean section      Family History  Problem Relation Age of Onset  . Hypertension Other   . Cancer Other     ovarian  . Cancer Mother   . Heart disease Father     History   Social History  . Marital Status: Divorced    Spouse Name: N/A    Number of Children: N/A  . Years of Education: N/A   Occupational History  . Not on file.   Social History Main Topics  . Smoking status: Never Smoker   . Smokeless tobacco: Never Used  . Alcohol Use: No  . Drug Use: No  . Sexual Activity: Not on file   Other Topics Concern  . Not on file   Social History Narrative   Has a living will.   Would desire CPR.  Would not want prolonged life support if futile.   Son and a friend have a copy.   Single, 4 children   Right handed   Associates degree   1 cup daily   Review of Systems Has some wrinkles--wants retinol. Sleeps okay  Objective:   Physical Exam  Constitutional: She appears well-developed and well-nourished. No distress.  Neck: Normal range of motion. Neck supple. No thyromegaly present.  Cardiovascular: Normal rate, regular rhythm, normal heart sounds and intact distal pulses.  Exam reveals no gallop.   No murmur heard. Normal circulation in hands  Pulmonary/Chest: Effort normal and breath sounds normal. No respiratory distress. She has no wheezes. She has no rales.  Musculoskeletal: She exhibits no edema and no tenderness.  Lymphadenopathy:    She has no cervical adenopathy.  Skin:  Cysts on various fingers  Psychiatric: She has a normal mood and affect. Her behavior is normal.          Assessment & Plan:

## 2014-07-04 NOTE — Assessment & Plan Note (Signed)
Getting relief from homeopathic remedy

## 2014-07-04 NOTE — Progress Notes (Signed)
Pre visit review using our clinic review tool, if applicable. No additional management support is needed unless otherwise documented below in the visit note. 

## 2014-07-04 NOTE — Addendum Note (Signed)
Addended by: Despina Hidden on: 07/04/2014 03:12 PM   Modules accepted: Orders

## 2014-07-05 ENCOUNTER — Encounter: Payer: Self-pay | Admitting: Internal Medicine

## 2014-07-05 NOTE — Telephone Encounter (Signed)
Please just let her know that this was a problem on her list already that I acknowledged didn't seem to be active or need treatment

## 2014-07-05 NOTE — Telephone Encounter (Signed)
Spoke with patient and advised results   

## 2014-07-07 ENCOUNTER — Encounter: Payer: Self-pay | Admitting: Internal Medicine

## 2014-07-11 ENCOUNTER — Encounter: Payer: Self-pay | Admitting: Internal Medicine

## 2014-07-11 ENCOUNTER — Telehealth: Payer: Self-pay

## 2014-07-11 NOTE — Telephone Encounter (Signed)
Pt having problems getting on mychart and pt request another activation code; I tried to generate an activation code but was unsuccessful; pt will contact [phone]. Pt will cb office if needed.

## 2014-07-27 ENCOUNTER — Telehealth: Payer: Self-pay | Admitting: Internal Medicine

## 2014-07-27 NOTE — Telephone Encounter (Signed)
Spoke with patient and she will try something OTC first and call if it gets worse

## 2014-07-27 NOTE — Telephone Encounter (Signed)
I don't remember discussing this at the visit  I usually don't treat toenail fungus since it tends to come back, but would consider it There is a very expensive new topical treatment and an older inexpensive one---- and pills. What did she have in mind?

## 2014-07-27 NOTE — Telephone Encounter (Signed)
Patient Information:  Caller Name: [name]  Phone: [phone]  Patient: [name]  Gender: Female  DOB: 1945/10/13  Age: 68 Years  PCP: [name] [facility])  Office Follow Up:  Does the office need to follow up with this patient?: Yes  Instructions For The Office: Please f/u with pt concerning Rx for toe mails fungus, thank you.   Symptoms  Reason For Call & Symptoms: Pt requesting Rx for toe nail fungus. Pt reports she has fungus on bilateral big toe.  Reviewed Health History In EMR: Yes  Reviewed Medications In EMR: Yes  Reviewed Allergies In EMR: Yes  Reviewed Surgeries / Procedures: Yes  Date of Onset of Symptoms: 07/27/2014  Guideline(s) Used:  No Protocol Available - Information Only  Disposition Per Guideline:   Discuss with PCP and Callback by Nurse Today  Reason For Disposition Reached:   Nursing judgment  Advice Given:  Call Back If:  New symptoms develop  You become worse.  Patient Refused Recommendation:  Patient Requests Prescription  Pt requesting Rx for fungus of toe nails, insurance will pay for Rx.  [place], [place]

## 2014-08-01 ENCOUNTER — Telehealth: Payer: Self-pay | Admitting: Internal Medicine

## 2014-08-01 NOTE — Telephone Encounter (Signed)
Patient Information:  Caller Name: Mahasin  Phone: [phone]  Patient: [name]  Gender: Female  DOB: 1946/04/08  Age: 68 Years  PCP: Viviana Simpler [facility])  Office Follow Up:  Does the office need to follow up with this patient?: N/A  Instructions For The Office: N/A  RN Note:  Reviewed EPIC- It does not show osteoporosis as problem list She would like her 2011 - Bone density reviewed and should she be on medication.  Please contact patient.  Symptoms  Reason For Call & Symptoms: Patient had Left humerus fracture in 11/2012.  She wanted to know if she has ever had Bone Density Scan?   Reviewed imaging and patient had one in 2011.  She would like to know if she has ever been diagnosed with osteoporosis and should she be taking anything?  Reviewed Health History In EMR: Yes  Reviewed Medications In EMR: Yes  Reviewed Allergies In EMR: Yes  Reviewed Surgeries / Procedures: Yes  Date of Onset of Symptoms: 08/01/2014  Guideline(s) Used:  No Protocol Available - Information Only  Disposition Per Guideline:   Discuss with PCP and Callback by Nurse Today  Reason For Disposition Reached:   Nursing judgment  Advice Given:  Call Back If:  New symptoms develop  You become worse.  Patient Will Follow Care Advice:  YES

## 2014-08-01 NOTE — Telephone Encounter (Signed)
It looks like she had dexa in 2011 and it was normal -so no dx of OP in the chart  Given her fracture - she can talk to her PCP when he is back in the office re: when or if he wants her to have another one  I recommend that my patients take a calcium supplement with vitamin D over the counter twice daily

## 2014-08-01 NOTE — Telephone Encounter (Signed)
Left detailed voicemail letting pt know Dr. Marliss Coots comments and that we will send this note to Dr. Silvio Pate to comment on when he returns next week

## 2014-08-02 NOTE — Telephone Encounter (Signed)
Noted I can review this at her next visit and similarly recommend vitamin D with or without calcium regularly (and regular weight bearing exercise) With a normal DEXA--- I usually won't repeat it-- at least not before she is 66

## 2014-08-16 ENCOUNTER — Other Ambulatory Visit: Payer: Self-pay

## 2014-08-16 MED ORDER — GLIPIZIDE 5 MG PO TABS: ORAL_TABLET | ORAL | Status: DC

## 2014-08-16 NOTE — Telephone Encounter (Signed)
Pt request 90 day rx for glipizide to [facility] [address]; pt states doing well on med and will see Dr Silvio Pate in April.advised refills done,.

## 2014-09-25 ENCOUNTER — Telehealth: Payer: Self-pay

## 2014-09-25 NOTE — Telephone Encounter (Signed)
PLEASE NOTE: All timestamps contained within this report are represented as [place] Standard Time. CONFIDENTIALTY NOTICE: This fax transmission is intended only for the addressee. It contains information that is legally privileged, confidential or otherwise protected from use or disclosure. If you are not the intended recipient, you are strictly prohibited from reviewing, disclosing, copying using or disseminating any of this information or taking any action in reliance on or regarding this information. If you have received this fax in error, please notify us immediately by telephone so that we can arrange for its return to [place]. Phone: [phone], Toll-Free: [phone], Fax: [phone] Page: 1 of 2 Call Id: [id number] [place] Patient Name: [name] Gender: Female DOB: [birth year] Age: 69 Y 50 M 21 D Return Phone Number: [phone] (Primary), [phone] (Secondary) Address[place] [address] Client [place] [facility] [place] Night - Client Client Site [place] Physician Viviana Simpler Contact Type Call Call Type Triage / Clinical Relationship To Patient Self Return Phone Number [phone] (Primary) Chief Complaint Blood Pressure High Initial Comment Caller states she has high BP157/105 PreDisposition Did not know what to do Nurse Assessment Nurse: Dimas Chyle, RN, Dellis Filbert Date/Time Eilene Ghazi Time): 09/23/2014 9:01:55 AM Confirm and document reason for call. If symptomatic, describe symptoms. ---Caller states she has high BP 157/105. Woke up this a.m. 173/104, 163/108, readings this a.m. Has the patient traveled out of the country within the last 30 days? ---No Does the patient require triage? ---Yes Related visit to physician within the last 2 weeks? ---No Does the PT have any chronic conditions? (i.e. diabetes, asthma,  etc.) ---Yes List chronic conditions. ---HTN, Diabetes Type 2 Guidelines Guideline Title Affirmed Question Affirmed Notes Nurse Date/Time (Eastern Time) High Blood Pressure [1] BP # 140/90 AND [2] taking BP medications Terrence Dupont 09/23/2014 9:06:05 AM Disp. Time Eilene Ghazi Time) Disposition Final User 09/23/2014 9:10:28 AM See PCP within 2 Weeks Yes Dimas Chyle, RN, Guerry Minors Understands: Yes Disagree/Comply: Comply Care Advice Given Per Guideline SEE PCP WITHIN 2 WEEKS: You need an evaluation for this ongoing problem within the next 2 weeks. Call your doctor during regular office hours and make an appointment. REASSURANCE: * Your blood pressure is elevated but you have told me that you are not having any symptoms. * You should see your doctor and have your blood pressure checked within 2 weeks. * You PLEASE NOTE: All timestamps contained within this report are represented as [place] Standard Time. CONFIDENTIALTY NOTICE: This fax transmission is intended only for the addressee. It contains information that is legally privileged, confidential or otherwise protected from use or disclosure. If you are not the intended recipient, you are strictly prohibited from reviewing, disclosing, copying using or disseminating any of this information or taking any action in reliance on or regarding this information. If you have received this fax in error, please notify us immediately by telephone so that we can arrange for its return to [place]. Phone: [phone], Toll-Free: [phone], Fax: [phone] Page: 2 of 2 Call Id: [id number] Care Advice Given Per Guideline might need to have an adjustment in your medication(s). HYPERTENSION MEDICATIONS: * Untreated hypertension may cause damage to the heart, brain, kidneys, and eyes. * It is important to take prescribed medications as directed. * The goal of blood pressure treatment for most patients with hypertension is to keep the blood pressure under  140/90. CALL BACK IF: * Weakness or numbness of  the face, arm or leg on one side of the body occurs * Difficulty walking, difficulty talking, or severe headache occurs * Chest pain or difficulty breathing occurs * Your blood pressure is over 160/100 * You become worse. CARE ADVICE given per High Blood Pressure (Adult) guideline. LIFESTYLE MODIFICATIONS - The following things can help you reduce your blood pressure: * EAT HEALTHY: Eat a diet rich in fresh fruits and vegetables, dietary fiber, non-animal protein (e.g., soy), and low-fat dairy products. Avoid foods with a high content of saturated fat or cholesterol. * DECREASE SODIUM INTAKE: Aim to eat less than 1,500 mg of sodium each day. Unfortunately 75% of the salt in the average person's diet is in pre-processed foods. * LIMIT ALCOHOL: Limit alcohol to 0-2 standard drinks each day. Men should have less than 14 dinks per week. Women should have less than 9 drinks per week. A drink is 1.5 oz hard liquor (one shot or jigger; 45 ml), 5 oz wine (small glass; 150 ml), 12 oz beer (one can; 360 ml). * EXERCISE, BE MORE PHYSICALLY ACTIVE: Do 30-60 minutes of moderate intensity exercise four to seven times a week. Examples include aerobic activities like brisk walking, cycling, and swimming. * REDUCE WEIGHT AND WAIST LINE: It is important to maintain a normal body weight. The goal should be a BMI (body mass index) under 25 for men and women, a waist circumference under 40 inches (102 cm) in men, and a waist circumference under 35 inches (88 cm) in women. * REDUCE STRESS: Find activities that help reduce your stress. Examples might include meditation, yoga, or even a restful walk in a park. After Care Instructions Given Call Event Type User Date / Time Description

## 2014-09-25 NOTE — Telephone Encounter (Signed)
Called pt back because we had a cancellation for tomorrow and rescheduled her at 10:15

## 2014-09-25 NOTE — Telephone Encounter (Signed)
okay

## 2014-09-25 NOTE — Telephone Encounter (Signed)
Spoke with patient and scheduled an appt for blood pressure check and also while on the phone with the patient she stated that after a bowel movement today, she noticed blood in the toilet and blood on her tissue, pt is not in pain and wanted to know if Wednesday was ok to wait until appt.

## 2014-09-25 NOTE — Telephone Encounter (Signed)
Please call her Her BP was up a little at our first visit but this is more Please have her set up an appt for me to reevaluate her numbers and decide if more medication is needed

## 2014-09-26 ENCOUNTER — Encounter: Payer: Self-pay | Admitting: Internal Medicine

## 2014-09-26 ENCOUNTER — Ambulatory Visit (INDEPENDENT_AMBULATORY_CARE_PROVIDER_SITE_OTHER): Payer: Commercial Managed Care - HMO | Admitting: Internal Medicine

## 2014-09-26 VITALS — BP 142/80 | HR 78 | Temp 97.5°F | Wt 204.0 lb

## 2014-09-26 DIAGNOSIS — I10 Essential (primary) hypertension: Secondary | ICD-10-CM | POA: Diagnosis not present

## 2014-09-26 MED ORDER — LISINOPRIL-HYDROCHLOROTHIAZIDE 20-25 MG PO TABS: 2.0000 | ORAL_TABLET | Freq: Every day | ORAL | Status: DC

## 2014-09-26 NOTE — Assessment & Plan Note (Signed)
BP Readings from Last 3 Encounters:  09/26/14 142/80  07/04/14 150/88  04/28/14 130/78   I got 126/80 on right Some high numbers at home and her cuff correlates fairly well Will increase the lisinopril/HCTZ to 2 daily (she didn't get dizzy taking 3!)

## 2014-09-26 NOTE — Progress Notes (Signed)
Subjective:    Patient ID: [name], female    DOB: 19-Mar-1946, 69 y.o.   MRN: [id number]  HPI Here for evaluation of elevated BP measures  Having elevated BP at home As high as 200/113 Other numbers 169/103, 174/104--took extra BP meds No headache, chest pain, SOB, dizziness  This morning 152/97 and 139/87  Checked it here multiple times on her machine 148/90 and 138/86  Had some blood from rectum yesterday Sees Dr Amedeo Plenty about this--dealing with hemorrhoids  Current Outpatient Prescriptions on File Prior to Visit  Medication Sig Dispense Refill  . BAYER MICROLET LANCETS lancets Use as instructed 100 each 12  . glipiZIDE (GLUCOTROL) 5 MG tablet Take 1/2 tab by mouth twice daily 90 tablet 1  . glucose blood (BAYER CONTOUR NEXT TEST) test strip Use to check blood sugar once daily Dx 250.00 100 each 12  . lisinopril-hydrochlorothiazide (PRINZIDE,ZESTORETIC) 20-25 MG per tablet Take 1 tablet by mouth daily. 90 tablet 3  . magnesium gluconate (MAGONATE) 500 MG tablet Take 500 mg by mouth 2 (two) times daily.    . Methylsulfonylmethane (MSM) 1000 MG CAPS Take by mouth daily.    . Multiple Vitamins-Minerals (MULTIVITAMIN WITH MINERALS) tablet Take 1 tablet by mouth daily.    . NON FORMULARY Memoral (take two tablet by mouth daily)    . Nutritional Supplements (IBS SUPPORT PO) Take 1 tablet by mouth 3 (three) times daily with meals.    . Omega 3-6-9 Fatty Acids (OMEGA 3-6-9 COMPLEX) CAPS Take 3 tablets by mouth daily.      No current facility-administered medications on file prior to visit.    No Known Allergies  Past Medical History  Diagnosis Date  . Hypertension     Past Surgical History  Procedure Laterality Date  . Cesarean section      Family History  Problem Relation Age of Onset  . Hypertension Other   . Cancer Other     ovarian  . Cancer Mother   . Heart disease Father     History   Social History  . Marital Status: Divorced    Spouse Name: N/A   Number of Children: N/A  . Years of Education: N/A   Occupational History  . Not on file.   Social History Main Topics  . Smoking status: Never Smoker   . Smokeless tobacco: Never Used  . Alcohol Use: No  . Drug Use: No  . Sexual Activity: Not on file   Other Topics Concern  . Not on file   Social History Narrative   Has a living will.   Would desire CPR.  Would not want prolonged life support if futile.   Son and a friend have a copy.   Single, 4 children   Right handed   Associates degree   1 cup daily   Review of Systems Some bleeding from nose Sleeps okay Appetite is fine    Objective:   Physical Exam  Constitutional: She appears well-developed and well-nourished. No distress.  Neck: Normal range of motion. Neck supple. No thyromegaly present.  Cardiovascular: Normal rate, regular rhythm and normal heart sounds.  Exam reveals no gallop.   No murmur heard. Pulmonary/Chest: Effort normal and breath sounds normal. No respiratory distress. She has no wheezes. She has no rales.  Musculoskeletal: She exhibits no edema.  Lymphadenopathy:    She has no cervical adenopathy.  Psychiatric: She has a normal mood and affect. Her behavior is normal.  Assessment & Plan:

## 2014-09-26 NOTE — Patient Instructions (Signed)
Please take 2 of the blood pressure tabs every day. Check your blood pressure once or twice a week and let me know if it remains elevated over 150/90 even after the increase (wait several weeks though)

## 2014-09-26 NOTE — Progress Notes (Signed)
Pre visit review using our clinic review tool, if applicable. No additional management support is needed unless otherwise documented below in the visit note. 

## 2014-09-27 ENCOUNTER — Ambulatory Visit: Payer: Commercial Managed Care - HMO | Admitting: Internal Medicine

## 2014-10-03 DIAGNOSIS — Z0100 Encounter for examination of eyes and vision without abnormal findings: Secondary | ICD-10-CM | POA: Diagnosis not present

## 2014-11-08 ENCOUNTER — Telehealth: Payer: Self-pay | Admitting: Internal Medicine

## 2014-11-08 DIAGNOSIS — F39 Unspecified mood [affective] disorder: Secondary | ICD-10-CM

## 2014-11-08 NOTE — Telephone Encounter (Signed)
Janace Aris was referred to her about a year ago she never saw him For her insurance she needs another referral

## 2014-11-08 NOTE — Telephone Encounter (Signed)
[name] was referred to her about a year ago she never saw him For her insurance she needs another referral Phone # [phone]

## 2014-11-08 NOTE — Telephone Encounter (Signed)
Pt called back the dr name was Janace Aris    Phone # [phone]

## 2014-11-08 NOTE — Telephone Encounter (Signed)
Left message for patient to call office for patient to call back and let us know if it was a psychologist she wanted to see.

## 2014-11-08 NOTE — Telephone Encounter (Signed)
Pt called wanting to get an appointment with psychiatrist she said this was recommended to her before she decline .  Now she wants an appointment

## 2014-11-08 NOTE — Telephone Encounter (Signed)
Please call her back I think I had mentioned seeing a psychologist, not psychiatrist. If that is what she meant, I can make a referral

## 2014-11-08 NOTE — Telephone Encounter (Signed)
Pt returned your call.  She is uncertain whether she wants to see a psychologist or psychiatrist.  She said that she was referred to soemone a year and a half ago and couldn't remember which specialist it is. Please call her back at [phone]

## 2014-11-09 ENCOUNTER — Other Ambulatory Visit: Payer: Self-pay | Admitting: Neurology

## 2014-11-09 ENCOUNTER — Telehealth: Payer: Self-pay | Admitting: Neurology

## 2014-11-09 DIAGNOSIS — R413 Other amnesia: Secondary | ICD-10-CM

## 2014-11-09 NOTE — Telephone Encounter (Signed)
Patient is calling to get another referral to see Dr. Antionette Poles. The last referral the patient did not make an appointment and Dr. Monico Hoar office states the old referral has expired and a new one is needed. This is a former Dr. Janann Colonel patient.

## 2014-11-09 NOTE — Telephone Encounter (Signed)
Is he psychiatrist or psychologist?

## 2014-11-09 NOTE — Telephone Encounter (Signed)
Please let patient know that I have have placed the referral for neuropsychological testing. However Dr. Valentina Shaggy is booked out for 4 months, will refer her to [facility] in [facility] instead. thanks

## 2014-11-09 NOTE — Telephone Encounter (Signed)
The Dr she is referring to is Dr Antionette Poles, Neuro Psychologist at [facility] . Perhaps she was referred to him thru [facility] as it looks like she was being seen there. I saw a DX for Cognitive Decline? Made an appt with you per your instructions to discuss this referral.

## 2014-11-10 ENCOUNTER — Telehealth: Payer: Self-pay | Admitting: *Deleted

## 2014-11-10 NOTE — Telephone Encounter (Signed)
Patient states she had EMG/NCS done with Dr. Janann Colonel on Jan 19, 2014.

## 2014-11-10 NOTE — Telephone Encounter (Signed)
Talked with patient and she was not aware the Dr. Janann Colonel was no longer at the practice and I told her Dr. Jaynee Eagles was taking over most of his pt. She said the referral was meant for her back pain more then memory loss and she received a call from [facility] this morning asking to do a memory test for them but she was not sure what they were talking about. I told her I would have to talk with Dr. Jaynee Eagles to see what she wants to do. Told pt Dr. Jaynee Eagles may want to see her in the office. We will call pt back to clarify. Pt verbalized understanding.

## 2014-11-12 NOTE — Telephone Encounter (Signed)
Terrence Dupont - This is a phone note on 11/09/2014. Patient called and asked to be rescheduled with Dr. Valentina Shaggy for neuropsych testing. But Dr. Valentina Shaggy is booked out 4 months and he is requesting that we try to also send referrals to [facility]. That is why I asked her to be scheduled with [facility] neuropsych testing instead as they can see patient sooner from what I was told. I called and left a message on patient's answering machine. Can you call patient and discuss and see where the disconnect is please. Is this message in error, did patient not call for neuropsych testing referral? Thank you.   " Benay Pillow at 11/09/2014 2:54 PM     Status: Signed       Expand All Collapse All   Patient is calling to get another referral to see Dr. Antionette Poles. The last referral the patient did not make an appointment and Dr. Monico Hoar office states the old referral has expired and a new one is needed. This is a former Dr. Janann Colonel patient

## 2014-11-14 ENCOUNTER — Telehealth: Payer: Self-pay | Admitting: *Deleted

## 2014-11-14 NOTE — Telephone Encounter (Signed)
Talked with patient and she is going to call [facility] back and set up an appointment with them per Dr. Cathren Laine referral.

## 2014-11-21 ENCOUNTER — Telehealth: Payer: Self-pay | Admitting: Neurology

## 2014-11-21 NOTE — Telephone Encounter (Signed)
Marian with Dr. Butler Denmark office is calling as she feels patient is calling back and forth between office and is mentally unstable.  Please call.

## 2014-11-22 NOTE — Telephone Encounter (Signed)
I don't think I have ever seen this patient before, have I? I don't see a note out there, I see Dr Hazle Quant note. I am on vacation so I can't be the work-in doctor. Hassan Rowan, can you explain?

## 2014-11-22 NOTE — Telephone Encounter (Signed)
I received this message yesterday, but I am on vacation. This is a former Dr. Janann Colonel patient:  "[name] with Dr. Butler Denmark office is calling as she feels patient is calling back and forth between office and is mentally unstable. Please call."

## 2014-11-22 NOTE — Telephone Encounter (Signed)
I can't see today but patient could probably go on anyone's schedule within a week.... I might be able to see on Thursday if I still have an opening

## 2014-11-27 NOTE — Telephone Encounter (Signed)
I called patient to set up appointment, pt was not sure what I was calling about she seemed confused and not sure what she wanted to be seen for.  She stated that she would call back at a later time to schedule an appointment when she figured out what she needed.

## 2014-11-28 ENCOUNTER — Telehealth: Payer: Self-pay | Admitting: *Deleted

## 2014-11-28 ENCOUNTER — Encounter: Payer: Self-pay | Admitting: Internal Medicine

## 2014-11-28 ENCOUNTER — Ambulatory Visit (INDEPENDENT_AMBULATORY_CARE_PROVIDER_SITE_OTHER)
Admission: RE | Admit: 2014-11-28 | Discharge: 2014-11-28 | Disposition: A | Discharge status: Home / Self Care | Payer: Commercial Managed Care - HMO | Source: Ambulatory Visit | Attending: Internal Medicine | Admitting: Internal Medicine

## 2014-11-28 ENCOUNTER — Ambulatory Visit (INDEPENDENT_AMBULATORY_CARE_PROVIDER_SITE_OTHER): Payer: Commercial Managed Care - HMO | Admitting: Internal Medicine

## 2014-11-28 VITALS — BP 140/88 | HR 70 | Temp 98.0°F | Wt 200.0 lb

## 2014-11-28 DIAGNOSIS — M25552 Pain in left hip: Secondary | ICD-10-CM

## 2014-11-28 DIAGNOSIS — R7309 Other abnormal glucose: Secondary | ICD-10-CM | POA: Diagnosis not present

## 2014-11-28 DIAGNOSIS — R208 Other disturbances of skin sensation: Secondary | ICD-10-CM | POA: Diagnosis not present

## 2014-11-28 DIAGNOSIS — M1612 Unilateral primary osteoarthritis, left hip: Secondary | ICD-10-CM | POA: Diagnosis not present

## 2014-11-28 DIAGNOSIS — I10 Essential (primary) hypertension: Secondary | ICD-10-CM | POA: Diagnosis not present

## 2014-11-28 DIAGNOSIS — M1611 Unilateral primary osteoarthritis, right hip: Secondary | ICD-10-CM | POA: Insufficient documentation

## 2014-11-28 DIAGNOSIS — R7303 Prediabetes: Secondary | ICD-10-CM

## 2014-11-28 DIAGNOSIS — M160 Bilateral primary osteoarthritis of hip: Secondary | ICD-10-CM | POA: Insufficient documentation

## 2014-11-28 DIAGNOSIS — R200 Anesthesia of skin: Secondary | ICD-10-CM

## 2014-11-28 DIAGNOSIS — G629 Polyneuropathy, unspecified: Secondary | ICD-10-CM | POA: Insufficient documentation

## 2014-11-28 LAB — T4, FREE: FREE T4: 0.78 ng/dL (ref 0.60–1.60)

## 2014-11-28 LAB — CBC WITH DIFFERENTIAL/PLATELET
BASOS ABS: 0 10*3/uL (ref 0.0–0.1)
Basophils Relative: 0.5 % (ref 0.0–3.0)
Eosinophils Absolute: 0.1 10*3/uL (ref 0.0–0.7)
Eosinophils Relative: 0.6 % (ref 0.0–5.0)
HCT: 45.3 % (ref 36.0–46.0)
Hemoglobin: 15.3 g/dL — ABNORMAL HIGH (ref 12.0–15.0)
Lymphocytes Relative: 12.9 % (ref 12.0–46.0)
Lymphs Abs: 1.2 10*3/uL (ref 0.7–4.0)
MCHC: 33.7 g/dL (ref 30.0–36.0)
MCV: 93.9 fl (ref 78.0–100.0)
Monocytes Absolute: 0.6 10*3/uL (ref 0.1–1.0)
Monocytes Relative: 6.7 % (ref 3.0–12.0)
Neutro Abs: 7.5 10*3/uL (ref 1.4–7.7)
Neutrophils Relative %: 79.3 % — ABNORMAL HIGH (ref 43.0–77.0)
Platelets: 267 10*3/uL (ref 150.0–400.0)
RBC: 4.83 Mil/uL (ref 3.87–5.11)
RDW: 13.6 % (ref 11.5–15.5)
WBC: 9.4 10*3/uL (ref 4.0–10.5)

## 2014-11-28 LAB — LIPID PANEL
Cholesterol: 184 mg/dL (ref 0–200)
HDL: 64.1 mg/dL (ref >39.00)
LDL Cholesterol: 100 mg/dL — ABNORMAL HIGH (ref 0–99)
NonHDL: 119.9
TRIGLYCERIDES: 102 mg/dL (ref 0.0–149.0)
Total CHOL/HDL Ratio: 3
VLDL: 20.4 mg/dL (ref 0.0–40.0)

## 2014-11-28 LAB — COMPREHENSIVE METABOLIC PANEL
ALT: 16 U/L (ref 0–35)
AST: 20 U/L (ref 0–37)
Albumin: 4.1 g/dL (ref 3.5–5.2)
Alkaline Phosphatase: 56 U/L (ref 39–117)
BUN: 23 mg/dL (ref 6–23)
CO2: 30 meq/L (ref 19–32)
CREATININE: 0.93 mg/dL (ref 0.40–1.20)
Calcium: 9.4 mg/dL (ref 8.4–10.5)
Chloride: 103 mEq/L (ref 96–112)
GFR: 64 mL/min (ref >60.00)
GLUCOSE: 111 mg/dL — AB (ref 70–99)
Potassium: 3.9 mEq/L (ref 3.5–5.1)
Sodium: 138 mEq/L (ref 135–145)
Total Bilirubin: 0.5 mg/dL (ref 0.2–1.2)
Total Protein: 7.2 g/dL (ref 6.0–8.3)

## 2014-11-28 LAB — HEMOGLOBIN A1C: HEMOGLOBIN A1C: 6 % (ref 4.6–6.5)

## 2014-11-28 NOTE — Assessment & Plan Note (Signed)
Sugars seem fine on glipizide Will recheck

## 2014-11-28 NOTE — Assessment & Plan Note (Signed)
BP Readings from Last 3 Encounters:  11/28/14 140/88  09/26/14 142/80  07/04/14 150/88   Higher at home Not sure I trust her cuff No changes needed

## 2014-11-28 NOTE — Progress Notes (Signed)
Subjective:    Patient ID: [name], female    DOB: 12-26-1945, 69 y.o.   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 Not always helpful. Uses heat rub also   Sugars seem to be fine Checks 3 times per week and under 120  No apparent hypoglycemic reactions ---mild symptoms if she skips meals Having tingling in feet and halfway up calves  BP has been high at home 132/84-- 152/100 No headaches  no chest pain No dizziness or syncope  Still gets some bleeding from her rectum This is not new  Current Outpatient Prescriptions on File Prior to Visit  Medication Sig Dispense Refill  . BAYER MICROLET LANCETS lancets Use as instructed 100 each 12  . glipiZIDE (GLUCOTROL) 5 MG tablet Take 1/2 tab by mouth twice daily 90 tablet 1  . glucose blood (BAYER CONTOUR NEXT TEST) test strip Use to check blood sugar once daily Dx 250.00 100 each 12  . lisinopril-hydrochlorothiazide (PRINZIDE,ZESTORETIC) 20-25 MG per tablet Take 2 tablets by mouth daily. 180 tablet 3  . magnesium gluconate (MAGONATE) 500 MG tablet Take 500 mg by mouth 2 (two) times daily.    . Methylsulfonylmethane (MSM) 1000 MG CAPS Take by mouth daily.    . Multiple Vitamins-Minerals (MULTIVITAMIN WITH MINERALS) tablet Take 1 tablet by mouth daily.    . NON FORMULARY Memoral (take two tablet by mouth daily)    . Nutritional Supplements (IBS SUPPORT PO) Take 1 tablet by mouth 3 (three) times daily with meals.    . Omega 3-6-9 Fatty Acids (OMEGA 3-6-9 COMPLEX) CAPS Take 3 tablets by mouth daily.      No current facility-administered medications on file prior to visit.    No Known Allergies  Past Medical History  Diagnosis Date  . Hypertension     Past Surgical  History  Procedure Laterality Date  . Cesarean section      Family History  Problem Relation Age of Onset  . Hypertension Other   . Cancer Other     ovarian  . Cancer Mother   . Heart disease Father     History   Social History  . Marital Status: Divorced    Spouse Name: N/A  . Number of Children: N/A  . Years of Education: N/A   Occupational History  . Not on file.   Social History Main Topics  . Smoking status: Never Smoker   . Smokeless tobacco: Never Used  . Alcohol Use: No  . Drug Use: No  . Sexual Activity: Not on file   Other Topics Concern  . Not on file   Social History Narrative   Has a living will.   Would desire CPR.  Would not want prolonged life support if futile.   Son and a friend have a copy.   Single, 4 children   Right handed   Associates degree   1 cup daily   Review of Systems Feels cold at times Sleeps okay Appetite is fine Weight is down a few pounds    Objective:   Physical Exam  Constitutional: She appears well-developed and well-nourished. No distress.  Neck: Normal range of motion. Neck supple. No thyromegaly present.  Cardiovascular: Normal rate, regular rhythm, normal heart  sounds and intact distal pulses.  Exam reveals no gallop.   No murmur heard. Pulmonary/Chest: Effort normal and breath sounds normal. No respiratory distress. She has no wheezes. She has no rales.  Musculoskeletal: She exhibits no edema or tenderness.  Very limited internal rotation of left hip  Lymphadenopathy:    She has no cervical adenopathy.  Psychiatric: She has a normal mood and affect.  Nervous energy--can't sit still,etc Not depressed          Assessment & Plan:

## 2014-11-28 NOTE — Progress Notes (Signed)
Pre visit review using our clinic review tool, if applicable. No additional management support is needed unless otherwise documented below in the visit note. 

## 2014-11-28 NOTE — Assessment & Plan Note (Signed)
Consistent with at least moderate osteoarthritis Discussed keeping fitness up Will check x-ray

## 2014-11-28 NOTE — Assessment & Plan Note (Signed)
Tingling bilaterally likely related to prediabetes Will check labs though

## 2014-11-28 NOTE — Telephone Encounter (Signed)
Let her know that lyrica is only indicated for certain types of neuropathic pain syndromes and I don't think she fits that diagnosis It also has a significant number of side effects I don't think it would be a good idea for her

## 2014-11-28 NOTE — Telephone Encounter (Signed)
Called and notified patient of Dr Alla German comments. Patient stated is there another rx that Dr Silvio Pate can prescribed for her back pain. Please advise.

## 2014-11-28 NOTE — Telephone Encounter (Signed)
Patient left a voicemail stating that she just saw Dr. Silvio Pate and had forgot to ask him about taking Lyrica for her back pain. Patient stated that she knows someone that has taken this for their back pain and it helped. Pharmacy [facility]

## 2014-11-29 NOTE — Telephone Encounter (Signed)
Let her know that I would start with tylenol arthritis 650mg  three times daily for at least a few weeks---as well as starting a regular exercise program (including core strenghtening) to increase her muscle strength

## 2014-11-29 NOTE — Telephone Encounter (Signed)
Spoke with patient and advised results   

## 2014-12-06 NOTE — Telephone Encounter (Signed)
Pt said the pain is no better in lower back and hips; pain level now is 8. Pt has been taking Tylenol arthritis 650 mg tid with no relief from pain; pt stopped taking gabapentin due to pt being fearful of addiction.  Pt wants to know what to do next. Pt request cb. Pt requested lab and xray results from 11/28/14; advised pt mychart results and pt voiced understanding. Viola

## 2014-12-07 ENCOUNTER — Other Ambulatory Visit: Payer: Self-pay | Admitting: *Deleted

## 2014-12-07 ENCOUNTER — Telehealth: Payer: Self-pay

## 2014-12-07 MED ORDER — GABAPENTIN 300 MG PO CAPS: 300.0000 mg | ORAL_CAPSULE | Freq: Three times a day (TID) | ORAL | Status: DC | PRN

## 2014-12-07 NOTE — Telephone Encounter (Signed)
Okay to send new Rx for her I would start at the lower dose she previously took--which is 300mg  Have her start just at bedtime--and she can add morning and afternoon doses after a few days if no problems and the pain continues  Send Rx #90 x 3 Tid prn as directed

## 2014-12-07 NOTE — Telephone Encounter (Signed)
Let her know that there is no addiction potential for gabapentin but there is for any other pain medication I might try. She should restart this and give it a try It can cause sedation at times but not dependence or addiction

## 2014-12-07 NOTE — Telephone Encounter (Signed)
Pt left v/m; pt found some old gabapentin that said discard after 09/2013; pt does not know if should be taking the gabapentin or not.Gabapentin not on current med list; on hx med last filled 2014 by another MD.Pt was seen 11/28/14. AzlePlease advise.

## 2014-12-07 NOTE — Telephone Encounter (Signed)
Patient advised. Medication phoned to pharmacy.  

## 2014-12-07 NOTE — Telephone Encounter (Signed)
Patient notified as instructed by telephone and verbalized understanding. 

## 2014-12-18 ENCOUNTER — Ambulatory Visit: Payer: Commercial Managed Care - HMO | Admitting: Family Medicine

## 2014-12-27 ENCOUNTER — Telehealth: Payer: Self-pay

## 2014-12-27 NOTE — Telephone Encounter (Signed)
Pt has medicare wellness exam on 01/10/15; pt received packet of info about appt in the mail and pt wants to know if she has problems to talk about at that appt will medicaid pay for that. Advised pt that medicaid would be filed but I cannot say what medicaid will or will not pay for; pt said she needs to know so I advised pt to contact medicaid. Pt voiced understanding.

## 2014-12-28 ENCOUNTER — Encounter: Payer: Self-pay | Admitting: Internal Medicine

## 2014-12-28 ENCOUNTER — Ambulatory Visit (INDEPENDENT_AMBULATORY_CARE_PROVIDER_SITE_OTHER): Payer: Commercial Managed Care - HMO | Admitting: Internal Medicine

## 2014-12-28 VITALS — BP 138/78 | HR 69 | Temp 97.6°F | Wt 203.0 lb

## 2014-12-28 DIAGNOSIS — M1612 Unilateral primary osteoarthritis, left hip: Secondary | ICD-10-CM | POA: Diagnosis not present

## 2014-12-28 MED ORDER — MELOXICAM 15 MG PO TABS: 15.0000 mg | ORAL_TABLET | Freq: Every day | ORAL | Status: DC

## 2014-12-28 NOTE — Progress Notes (Signed)
Subjective:    Patient ID: [name], female    DOB: 04-24-1946, 69 y.o.   MRN: [id number]  HPI  Pt presents to the clinic today with c/o left hip pain. This has been a onging issue. She was seen 1 mnth ago for the same. Xray of left hip showed arthritic changes. She was advised to take Ibuprofen and work on exercise. She reports that the Gabapentin Dr. Silvio Pate gave her is not helping the pain in her hip. She is even taking 600 mg TID. Looking back in Dr. Everardo Beals, he prescribed her Gabapentin for neuropathy possible related to prediabetes, not for her hip pain. She is having some difficulty with gait due to the pain. She has not tried anything OTC or exercised.  Review of Systems      Past Medical History  Diagnosis Date  . Hypertension     Current Outpatient Prescriptions  Medication Sig Dispense Refill  . BAYER MICROLET LANCETS lancets Use as instructed 100 each 12  . gabapentin (NEURONTIN) 300 MG capsule Take 1 capsule (300 mg total) by mouth 3 (three) times daily as needed. 90 capsule 3  . glipiZIDE (GLUCOTROL) 5 MG tablet Take 1/2 tab by mouth twice daily 90 tablet 1  . glucose blood (BAYER CONTOUR NEXT TEST) test strip Use to check blood sugar once daily Dx 250.00 100 each 12  . lisinopril-hydrochlorothiazide (PRINZIDE,ZESTORETIC) 20-25 MG per tablet Take 2 tablets by mouth daily. 180 tablet 3  . magnesium gluconate (MAGONATE) 500 MG tablet Take 500 mg by mouth 2 (two) times daily.    . Methylsulfonylmethane (MSM) 1000 MG CAPS Take by mouth daily.    . Multiple Vitamins-Minerals (MULTIVITAMIN WITH MINERALS) tablet Take 1 tablet by mouth daily.    . NON FORMULARY Memoral (take two tablet by mouth daily)    . Nutritional Supplements (IBS SUPPORT PO) Take 1 tablet by mouth 3 (three) times daily with meals.    . Omega 3-6-9 Fatty Acids (OMEGA 3-6-9 COMPLEX) CAPS Take 3 tablets by mouth daily.      No current facility-administered medications for this visit.    No Known  Allergies  Family History  Problem Relation Age of Onset  . Hypertension Other   . Cancer Other     ovarian  . Cancer Mother   . Heart disease Father     History   Social History  . Marital Status: Divorced    Spouse Name: N/A  . Number of Children: N/A  . Years of Education: N/A   Occupational History  . Not on file.   Social History Main Topics  . Smoking status: Never Smoker   . Smokeless tobacco: Never Used  . Alcohol Use: No  . Drug Use: No  . Sexual Activity: Not on file   Other Topics Concern  . Not on file   Social History Narrative   Has a living will.   Would desire CPR.  Would not want prolonged life support if futile.   Son and a friend have a copy.   Single, 4 children   Right handed   Associates degree   1 cup daily     Constitutional: Denies fever, malaise, fatigue, headache or abrupt weight changes.  Musculoskeletal: Pt reports hip pain. Denies muscle pain or joint swelling.    No other specific complaints in a complete review of systems (except as listed in HPI above).  Objective:   Physical Exam  BP 138/78 mmHg  Pulse 69  Temp(Src)  97.6 F (36.4 C) (Oral)  Wt 203 lb (92.08 kg)  SpO2 98%  Wt Readings from Last 3 Encounters:  12/28/14 203 lb (92.08 kg)  11/28/14 200 lb (90.719 kg)  09/26/14 204 lb (92.534 kg)    General: Appears her stated age, chronically ill appearing in NAD. Skin: Warm, dry and intact.  Cardiovascular: Normal rate and rhythm. S1,S2 noted.  No murmur, rubs or gallops noted.  Pulmonary/Chest: Normal effort and positive vesicular breath sounds. No respiratory distress. No wheezes, rales or ronchi noted.  Musculoskeletal: Decreased flexion of the spine. Decreased internal rotation of the left hip. Some pain with palpation over the left SI joint. No swelling noted. Mildly ataxic gait.  BMET    Component Value Date/Time   NA 138 11/28/2014 1048   K 3.9 11/28/2014 1048   CL 103 11/28/2014 1048   CO2 30  11/28/2014 1048   GLUCOSE 111* 11/28/2014 1048   BUN 23 11/28/2014 1048   CREATININE 0.93 11/28/2014 1048   CALCIUM 9.4 11/28/2014 1048    Lipid Panel     Component Value Date/Time   CHOL 184 11/28/2014 1048   TRIG 102.0 11/28/2014 1048   HDL 64.10 11/28/2014 1048   CHOLHDL 3 11/28/2014 1048   VLDL 20.4 11/28/2014 1048   LDLCALC 100* 11/28/2014 1048    CBC    Component Value Date/Time   WBC 9.4 11/28/2014 1048   RBC 4.83 11/28/2014 1048   HGB 15.3* 11/28/2014 1048   HCT 45.3 11/28/2014 1048   PLT 267.0 11/28/2014 1048   MCV 93.9 11/28/2014 1048   MCHC 33.7 11/28/2014 1048   RDW 13.6 11/28/2014 1048   LYMPHSABS 1.2 11/28/2014 1048   MONOABS 0.6 11/28/2014 1048   EOSABS 0.1 11/28/2014 1048   BASOSABS 0.0 11/28/2014 1048    Hgb A1C Lab Results  Component Value Date   HGBA1C 6.0 11/28/2014        Assessment & Plan:   Left hip pain:  Xray reviewed- OA changes noted Advised her the Gabapentin was not for her left hip pain but for the tingling in her feet, she seems very confused about this RX given for Meloxicam to take daily, advised her to not take any additional Ibuprofen, or Aleve with this, only Tylenol for additional Hip exercises given  RTC as needed

## 2014-12-28 NOTE — Patient Instructions (Signed)
Hip Pain Your hip is the joint between your upper legs and your lower pelvis. The bones, cartilage, tendons, and muscles of your hip joint perform a lot of work each day supporting your body weight and allowing you to move around. Hip pain can range from a minor ache to severe pain in one or both of your hips. Pain may be felt on the inside of the hip joint near the groin, or the outside near the buttocks and upper thigh. You may have swelling or stiffness as well.  HOME CARE INSTRUCTIONS   Take medicines only as directed by your health care provider.  Apply ice to the injured area:  Put ice in a plastic bag.  Place a towel between your skin and the bag.  Leave the ice on for 15-20 minutes at a time, 3-4 times a day.  Keep your leg raised (elevated) when possible to lessen swelling.  Avoid activities that cause pain.  Follow specific exercises as directed by your health care provider.  Sleep with a pillow between your legs on your most comfortable side.  Record how often you have hip pain, the location of the pain, and what it feels like. SEEK MEDICAL CARE IF:   You are unable to put weight on your leg.  Your hip is red or swollen or very tender to touch.  Your pain or swelling continues or worsens after 1 week.  You have increasing difficulty walking.  You have a fever. SEEK IMMEDIATE MEDICAL CARE IF:   You have fallen.  You have a sudden increase in pain and swelling in your hip. MAKE SURE YOU:   Understand these instructions.  Will watch your condition.  Will get help right away if you are not doing well or get worse. Document Released: 02/12/2010 Document Revised: 01/09/2014 Document Reviewed: 04/21/2013 [employer] Patient Information 2015 [employer]. This information is not intended to replace advice given to you by your health care provider. Make sure you discuss any questions you have with your health care provider.  

## 2014-12-28 NOTE — Progress Notes (Signed)
Pre visit review using our clinic review tool, if applicable. No additional management support is needed unless otherwise documented below in the visit note. 

## 2015-01-01 ENCOUNTER — Telehealth: Payer: Self-pay

## 2015-01-01 NOTE — Telephone Encounter (Signed)
Noted  

## 2015-01-01 NOTE — Telephone Encounter (Signed)
PLEASE NOTE: All timestamps contained within this report are represented as [place] Standard Time. CONFIDENTIALTY NOTICE: This fax transmission is intended only for the addressee. It contains information that is legally privileged, confidential or otherwise protected from use or disclosure. If you are not the intended recipient, you are strictly prohibited from reviewing, disclosing, copying using or disseminating any of this information or taking any action in reliance on or regarding this information. If you have received this fax in error, please notify us immediately by telephone so that we can arrange for its return to [place]. Phone: [phone], Toll-Free: [phone], Fax: [phone] Page: 1 of 1 Call Id: [id number] [place] Patient Name: [name] Gender: Female DOB: 12-07-1945 Age: 69 Y 64 M 28 D Return Phone Number: [phone] (Primary), [phone] (Secondary) Address[place] [address] Client [facility] - Client Client Site [place] Physician Viviana Simpler Contact Type Call Call Type Triage / Clinical Relationship To Patient Self Return Phone Number [phone] (Primary) Chief Complaint Medication Question (non symptomatic) Initial Comment Caller states I am not sure what medication I should be taking Nurse Assessment Guidelines Guideline Title Affirmed Question Affirmed Notes Nurse Date/Time (Eastern Time) Disp. Time Eilene Ghazi Time) Disposition Final User 12/30/2014 10:10:35 AM Clinical Call Yes Harlow Mares, RN, Suanne Marker After Care Instructions Given Call Event Type User Date / Time Description Comments User: Tamala Fothergill, RN Date/Time Eilene Ghazi Time): 12/30/2014 10:10:22 AM Caller wanting to check for Drug interactions between: Gabapentin, Tylenol, Glipizide; metronidazole According to drugs.com;  there are no noted drug reactions between these drugs, Advised to take as prescribed. Caller voiced understanding. Denied any further assistance.

## 2015-01-05 ENCOUNTER — Encounter: Payer: Commercial Managed Care - HMO | Admitting: Internal Medicine

## 2015-01-10 ENCOUNTER — Telehealth: Payer: Self-pay

## 2015-01-10 ENCOUNTER — Ambulatory Visit (INDEPENDENT_AMBULATORY_CARE_PROVIDER_SITE_OTHER): Payer: Commercial Managed Care - HMO | Admitting: Internal Medicine

## 2015-01-10 ENCOUNTER — Encounter: Payer: Self-pay | Admitting: Internal Medicine

## 2015-01-10 VITALS — BP 142/90 | HR 70 | Temp 97.8°F | Ht 63.0 in | Wt 204.0 lb

## 2015-01-10 DIAGNOSIS — Z23 Encounter for immunization: Secondary | ICD-10-CM

## 2015-01-10 DIAGNOSIS — G629 Polyneuropathy, unspecified: Secondary | ICD-10-CM

## 2015-01-10 DIAGNOSIS — I10 Essential (primary) hypertension: Secondary | ICD-10-CM

## 2015-01-10 DIAGNOSIS — Z0000 Encounter for general adult medical examination without abnormal findings: Secondary | ICD-10-CM | POA: Diagnosis not present

## 2015-01-10 DIAGNOSIS — R7309 Other abnormal glucose: Secondary | ICD-10-CM

## 2015-01-10 DIAGNOSIS — R413 Other amnesia: Secondary | ICD-10-CM

## 2015-01-10 DIAGNOSIS — R7303 Prediabetes: Secondary | ICD-10-CM

## 2015-01-10 DIAGNOSIS — F39 Unspecified mood [affective] disorder: Secondary | ICD-10-CM

## 2015-01-10 DIAGNOSIS — M160 Bilateral primary osteoarthritis of hip: Secondary | ICD-10-CM

## 2015-01-10 NOTE — Assessment & Plan Note (Signed)
I have personally reviewed the Medicare Annual Wellness questionnaire and have noted 1. The patient's medical and social history 2. Their use of alcohol, tobacco or illicit drugs 3. Their current medications and supplements 4. The patient's functional ability including ADL's, fall risks, home safety risks and hearing or visual             impairment. 5. Diet and physical activities 6. Evidence for depression or mood disorders  The patients weight, height, BMI and visual acuity have been recorded in the chart I have made referrals, counseling and provided education to the patient based review of the above and I have provided the pt with a written personalized care plan for preventive services.  I have provided you with a copy of your personalized plan for preventive services. Please take the time to review along with your updated medication list.  prevnar today Prefers no breast exam or mammogram Colonoscopy due 2021 Normal DEXA 2011

## 2015-01-10 NOTE — Assessment & Plan Note (Signed)
Not painful so will stop the gabapentin--it is confusing her

## 2015-01-10 NOTE — Telephone Encounter (Signed)
Pt was seen earlier today and received a prevnar 79 but pt did not see the prevnar listed on immunization list pt received; advised pt prevnar 13 is on her current immunization list now; probably CMA did not have time to record in your chart prior to pt leaving. Pt said it was OK just wanted to be assured that the immunization was listed. Advised pt done and pt voiced understanding.

## 2015-01-10 NOTE — Patient Instructions (Addendum)
Please ask about a core strengthening program. Please get me a copy of your health care power of attorney and living will. Please try ibuprofen 200mg  (over the counter), 1-2 before walking or at night --if you are having ongoing pain  DASH Eating Plan DASH stands for "Dietary Approaches to Stop Hypertension." The DASH eating plan is a healthy eating plan that has been shown to reduce high blood pressure (hypertension). Additional health benefits may include reducing the risk of type 2 diabetes mellitus, heart disease, and stroke. The DASH eating plan may also help with weight loss. WHAT DO I NEED TO KNOW ABOUT THE DASH EATING PLAN? For the DASH eating plan, you will follow these general guidelines:  Choose foods with a percent daily value for sodium of less than 5% (as listed on the food label).  Use salt-free seasonings or herbs instead of table salt or sea salt.  Check with your health care provider or pharmacist before using salt substitutes.  Eat lower-sodium products, often labeled as "lower sodium" or "no salt added."  Eat fresh foods.  Eat more vegetables, fruits, and low-fat dairy products.  Choose whole grains. Look for the word "whole" as the first word in the ingredient list.  Choose fish and skinless chicken or [place] more often than red meat. Limit fish, poultry, and meat to 6 oz (170 g) each day.  Limit sweets, desserts, sugars, and sugary drinks.  Choose heart-healthy fats.  Limit cheese to 1 oz (28 g) per day.  Eat more home-cooked food and less restaurant, buffet, and fast food.  Limit fried foods.  Cook foods using methods other than frying.  Limit canned vegetables. If you do use them, rinse them well to decrease the sodium.  When eating at a restaurant, ask that your food be prepared with less salt, or no salt if possible. WHAT FOODS CAN I EAT? Seek help from a dietitian for individual calorie needs. Grains Whole grain or whole wheat bread. Brown rice.  Whole grain or whole wheat pasta. Quinoa, bulgur, and whole grain cereals. Low-sodium cereals. Corn or whole wheat flour tortillas. Whole grain cornbread. Whole grain crackers. Low-sodium crackers. Vegetables Fresh or frozen vegetables (raw, steamed, roasted, or grilled). Low-sodium or reduced-sodium tomato and vegetable juices. Low-sodium or reduced-sodium tomato sauce and paste. Low-sodium or reduced-sodium canned vegetables.  Fruits All fresh, canned (in natural juice), or frozen fruits. Meat and Other Protein Products Ground beef (85% or leaner), grass-fed beef, or beef trimmed of fat. Skinless chicken or [place]. Ground chicken or [place]. Pork trimmed of fat. All fish and seafood. Eggs. Dried beans, peas, or lentils. Unsalted nuts and seeds. Unsalted canned beans. Dairy Low-fat dairy products, such as skim or 1% milk, 2% or reduced-fat cheeses, low-fat ricotta or cottage cheese, or plain low-fat yogurt. Low-sodium or reduced-sodium cheeses. Fats and Oils Tub margarines without trans fats. Light or reduced-fat mayonnaise and salad dressings (reduced sodium). Avocado. Safflower, olive, or canola oils. Natural peanut or almond butter. Other Unsalted popcorn and pretzels. The items listed above may not be a complete list of recommended foods or beverages. Contact your dietitian for more options. WHAT FOODS ARE NOT RECOMMENDED? Grains White bread. White pasta. White rice. Refined cornbread. Bagels and croissants. Crackers that contain trans fat. Vegetables Creamed or fried vegetables. Vegetables in a cheese sauce. Regular canned vegetables. Regular canned tomato sauce and paste. Regular tomato and vegetable juices. Fruits Dried fruits. Canned fruit in light or heavy syrup. Fruit juice. Meat and Other Protein Products Fatty  cuts of meat. Ribs, chicken wings, bacon, sausage, bologna, salami, chitterlings, fatback, hot dogs, bratwurst, and packaged luncheon meats. Salted nuts and seeds. Canned  beans with salt. Dairy Whole or 2% milk, cream, half-and-half, and cream cheese. Whole-fat or sweetened yogurt. Full-fat cheeses or blue cheese. Nondairy creamers and whipped toppings. Processed cheese, cheese spreads, or cheese curds. Condiments Onion and garlic salt, seasoned salt, table salt, and sea salt. Canned and packaged gravies. Worcestershire sauce. Tartar sauce. Barbecue sauce. Teriyaki sauce. Soy sauce, including reduced sodium. Steak sauce. Fish sauce. Oyster sauce. Cocktail sauce. Horseradish. Ketchup and mustard. Meat flavorings and tenderizers. Bouillon cubes. Hot sauce. Tabasco sauce. Marinades. Taco seasonings. Relishes. Fats and Oils Butter, stick margarine, lard, shortening, ghee, and bacon fat. Coconut, palm kernel, or palm oils. Regular salad dressings. Other Pickles and olives. Salted popcorn and pretzels. The items listed above may not be a complete list of foods and beverages to avoid. Contact your dietitian for more information. WHERE CAN I FIND MORE INFORMATION? National Heart, Lung, and Blood Institute: travelstabloid.com Document Released: 08/14/2011 Document Revised: 01/09/2014 Document Reviewed: 06/29/2013 [facility] Patient Information 2015 [place], [place]. This information is not intended to replace advice given to you by your health care provider. Make sure you discuss any questions you have with your health care provider.

## 2015-01-10 NOTE — Assessment & Plan Note (Signed)
Sugars fine on glipizide

## 2015-01-10 NOTE — Progress Notes (Signed)
Subjective:    Patient ID: [name], female    DOB: Mar 25, 1946, 69 y.o.   MRN: [id number]  HPI Here for initial Medicare wellness visit and follow up of chronic medical conditions Reviewed form and advanced directives No alcohol or tobacco Trying to do regular exercise No falls Occasional feelings of depression--feels limited due to pain. Not really anhedonic Vision okay. Hearing is okay with aides Independent with instrumental ADLs Some memory issues--see below Reviewed other physicians  Had forgotten to take tylenol for her hip Now taking the tylenol Also using capsaicin Doing silver sneakers --discussed some core strengthening  Still with tingling in feet Not really painful Discussed that gabapentin won't help unless painful--and she hasn't noticed it helping  Memory issues are about the same Remains independent with instrumental ADLs Pays all bills Just tends to be forgetful  No chest pain Chronic intermittent palpitations--just a few seconds No dizziness or syncope Some dizziness lately--but no syncope Rechecked her cuff here--was 40 mg Hg higher than ours  Checks sugars twice a week or so 92-- 133 Only 2 values over 120 Counseled on eating right (still had occasional sugared drinks)  Current Outpatient Prescriptions on File Prior to Visit  Medication Sig Dispense Refill  . BAYER MICROLET LANCETS lancets Use as instructed 100 each 12  . glipiZIDE (GLUCOTROL) 5 MG tablet Take 1/2 tab by mouth twice daily 90 tablet 1  . glucose blood (BAYER CONTOUR NEXT TEST) test strip Use to check blood sugar once daily Dx 250.00 100 each 12  . lisinopril-hydrochlorothiazide (PRINZIDE,ZESTORETIC) 20-25 MG per tablet Take 2 tablets by mouth daily. 180 tablet 3  . magnesium gluconate (MAGONATE) 500 MG tablet Take 500 mg by mouth 2 (two) times daily.    . Multiple Vitamins-Minerals (MULTIVITAMIN WITH MINERALS) tablet Take 1 tablet by mouth daily.    . NON FORMULARY Memoral  (take two tablet by mouth daily)    . Nutritional Supplements (IBS SUPPORT PO) Take 1 tablet by mouth 3 (three) times daily with meals.    . Omega 3-6-9 Fatty Acids (OMEGA 3-6-9 COMPLEX) CAPS Take 3 tablets by mouth daily.      No current facility-administered medications on file prior to visit.    No Known Allergies  Past Medical History  Diagnosis Date  . Hypertension     Past Surgical History  Procedure Laterality Date  . Cesarean section      Family History  Problem Relation Age of Onset  . Hypertension Other   . Cancer Other     ovarian  . Cancer Mother   . Heart disease Father     History   Social History  . Marital Status: Divorced    Spouse Name: N/A  . Number of Children: 4  . Years of Education: N/A   Occupational History  . Not on file.   Social History Main Topics  . Smoking status: Never Smoker   . Smokeless tobacco: Never Used  . Alcohol Use: No  . Drug Use: No  . Sexual Activity: Not on file   Other Topics Concern  . Not on file   Social History Narrative   Has a living will.   Son is health care POA   Would desire CPR.  Would not want prolonged life support if futile.   Probably would not want tube feeds if cognitively unaware   Review of Systems Generally sleeps okay--occasionally has pain. Tylenol usually helps Appetite is fine Weight is about the same Did have  recent bout of bowel problems--used left over antibiotic from Dr Amedeo Plenty. This helped No suspicious skin lesions Regular with dentist--teeth okay Wears seat belt    Objective:   Physical Exam  Constitutional: She is oriented to person, place, and time. She appears well-developed and well-nourished. No distress.  Neck: Normal range of motion. Neck supple. No thyromegaly present.  Cardiovascular: Normal rate, regular rhythm, normal heart sounds and intact distal pulses.  Exam reveals no gallop.   No murmur heard. Pulmonary/Chest: Effort normal and breath sounds normal. No  respiratory distress. She has no wheezes. She has no rales.  Abdominal: Soft. There is no tenderness.  Musculoskeletal:  Trace ankle edema  Lymphadenopathy:    She has no cervical adenopathy.  Neurological: She is alert and oriented to person, place, and time.  President-- "Obama, ?" [id number] Recall 2/3  Skin: No rash noted. No erythema.  Psychiatric:  Labile--tearful at times when talking about pain          Assessment & Plan:

## 2015-01-10 NOTE — Assessment & Plan Note (Signed)
Very labile Over responsive to little stress Will make referral to psychology

## 2015-01-10 NOTE — Assessment & Plan Note (Signed)
BP Readings from Last 3 Encounters:  01/10/15 142/90  12/28/14 138/78  11/28/14 140/88   Fair control No need for change now Working on lifestyle

## 2015-01-10 NOTE — Assessment & Plan Note (Signed)
[facility]CI? No apparent decline

## 2015-01-10 NOTE — Addendum Note (Signed)
Addended by: Despina Hidden on: 01/10/2015 10:18 AM   Modules accepted: Orders

## 2015-01-10 NOTE — Progress Notes (Signed)
Pre visit review using our clinic review tool, if applicable. No additional management support is needed unless otherwise documented below in the visit note. 

## 2015-01-10 NOTE — Assessment & Plan Note (Signed)
Discussed adding prn ibuprofen

## 2015-01-12 ENCOUNTER — Telehealth: Payer: Self-pay

## 2015-01-12 NOTE — Telephone Encounter (Signed)
Pt left v/m; pt was seen on 01/10/15 and advised to do core strengthening program; pt went to [facility] about starting core exercises and [facility] advised pt there were lots of different exercises for the core and pt needs more specific instructions on which core exercises that need to be done or pt wanted to know if should be referred to physical therapy to assist pt with core exercises. Pt request cb.

## 2015-01-13 NOTE — Telephone Encounter (Signed)
That is disappointing because I am not an exercise specialist. She needs low impact exercises, like planks, to strengthen back and abdominal muscles. She should probably do a general resistance program to increase muscle strength in her entire body She needs to go back and state she needs just a general fitness program She doesn't need PT for this---it would not be covered and her function is too high for PT

## 2015-01-15 NOTE — Telephone Encounter (Signed)
Spoke with patient and advised results, she will go back to the [facility] to get a better understanding, per pt she was told that the wrong core exercises could harm her.

## 2015-01-29 ENCOUNTER — Telehealth: Payer: Self-pay

## 2015-01-29 MED ORDER — BAYER CONTOUR NEXT CONTROL NORMAL VI SOLN: Status: DC

## 2015-01-29 NOTE — Telephone Encounter (Signed)
rx sent to pharmacy by e-script  

## 2015-01-29 NOTE — Telephone Encounter (Signed)
Pt left v/m requesting contour Next glucose solution to make sure test strips are OK.pt has solution but is out of date and request new refill to [facility] [address]. I tried to contact pt to make sure she is requesting calibration solution but unable to reach pt.

## 2015-01-30 ENCOUNTER — Ambulatory Visit: Payer: Self-pay | Admitting: Psychology

## 2015-01-30 NOTE — Telephone Encounter (Signed)
Pt calls, [facility] does not have calibration solution and advised pt to contact manufacturer. Pt wanted to know if we had access to solution advised no and after speaking with [facility] CMA pt was advised usually there is a code number on bottle of test strips that should be same as code # in glucose meter and calibration is not needed anymore. Pt said she will contact manufacturer.

## 2015-02-13 ENCOUNTER — Telehealth: Payer: Self-pay

## 2015-02-13 NOTE — Telephone Encounter (Signed)
Pt left v/m; pt wants to have stress test ; pt gets SOB when on treadmill; pt has CP once a month. pt has never had stress test. Pt has been reading and wants to know when started taking lisinopril - HCTZ twice a day; advised pt per chart 09/26/2014 and pt wants to know when started glipizide; advised pt per chart 02/14/2014. Pt voiced understanding and will wait to hear if can have stress test. If pt condition changes or worsens prior to cb pt will contact [place].

## 2015-02-14 NOTE — Telephone Encounter (Signed)
Please call pt [phone]

## 2015-02-14 NOTE — Telephone Encounter (Signed)
If she is having symptoms, she needs an appt. Depending on her status and EKG, a decision can be made if testing is appropriate and if so, what test should be done

## 2015-02-14 NOTE — Telephone Encounter (Signed)
.  left message to have patient return my call.  

## 2015-02-14 NOTE — Telephone Encounter (Signed)
Spoke with patient and advised results appt scheduled 

## 2015-02-22 ENCOUNTER — Encounter: Payer: Self-pay | Admitting: Internal Medicine

## 2015-02-22 ENCOUNTER — Ambulatory Visit (INDEPENDENT_AMBULATORY_CARE_PROVIDER_SITE_OTHER): Payer: Commercial Managed Care - HMO | Admitting: Internal Medicine

## 2015-02-22 VITALS — BP 132/80 | HR 67 | Temp 98.0°F | Wt 203.0 lb

## 2015-02-22 DIAGNOSIS — R7309 Other abnormal glucose: Secondary | ICD-10-CM | POA: Diagnosis not present

## 2015-02-22 DIAGNOSIS — R7303 Prediabetes: Secondary | ICD-10-CM

## 2015-02-22 DIAGNOSIS — G629 Polyneuropathy, unspecified: Secondary | ICD-10-CM

## 2015-02-22 MED ORDER — GABAPENTIN 300 MG PO CAPS: 300.0000 mg | ORAL_CAPSULE | Freq: Three times a day (TID) | ORAL | Status: DC

## 2015-02-22 NOTE — Progress Notes (Signed)
Subjective:    Patient ID: [name], female    DOB: 11-14-1945, 69 y.o.   MRN: [id number]  HPI Here due to trouble with her breathing and other concerns Gets easy DOE but is not overly concerned---no problems when at rest  Stopped the glipizide Started 6/15 but had read about problems with interfering with BP meds Has been monitoring her sugars -- 90-115 without the glipizide She is trying to be careful with what she eats  Continues to have concerns about trying to exercise Trying to exercise "is killing me" Taking tylenol and ibuprofen 400 bid and still in pain Neck, posterior hips to thighs, etc ??related to stopping gabapentin  Current Outpatient Prescriptions on File Prior to Visit  Medication Sig Dispense Refill  . BAYER MICROLET LANCETS lancets Use as instructed 100 each 12  . Blood Glucose Calibration (CONTOUR NEXT CONTROL LEVEL 2) NORMAL SOLN Use to calibrate glucose machine dx: R73.09 1 each 0  . glucose blood (BAYER CONTOUR NEXT TEST) test strip Use to check blood sugar once daily Dx 250.00 100 each 12  . ibuprofen (ADVIL,MOTRIN) 200 MG tablet Take 200-400 mg by mouth 3 (three) times daily as needed.    Marland Kitchen lisinopril-hydrochlorothiazide (PRINZIDE,ZESTORETIC) 20-25 MG per tablet Take 2 tablets by mouth daily. 180 tablet 3  . Multiple Vitamins-Minerals (MULTIVITAMIN WITH MINERALS) tablet Take 1 tablet by mouth daily.    . NON FORMULARY Memoral (take two tablet by mouth daily)    . Omega 3-6-9 Fatty Acids (OMEGA 3-6-9 COMPLEX) CAPS Take 3 tablets by mouth daily.      No current facility-administered medications on file prior to visit.    No Known Allergies  Past Medical History  Diagnosis Date  . Hypertension     Past Surgical History  Procedure Laterality Date  . Cesarean section      Family History  Problem Relation Age of Onset  . Hypertension Other   . Cancer Other     ovarian  . Cancer Mother   . Heart disease Father     History   Social  History  . Marital Status: Divorced    Spouse Name: N/A  . Number of Children: 4  . Years of Education: N/A   Occupational History  . Not on file.   Social History Main Topics  . Smoking status: Never Smoker   . Smokeless tobacco: Never Used  . Alcohol Use: No  . Drug Use: No  . Sexual Activity: Not on file   Other Topics Concern  . Not on file   Social History Narrative   Has a living will.   Son is health care POA   Would desire CPR.  Would not want prolonged life support if futile.   Probably would not want tube feeds if cognitively unaware   Review of Systems Not sleeping well Has pain that keeps her from resting well Appetite is fine    Objective:   Physical Exam  Constitutional: She appears well-developed and well-nourished. No distress.  Neck: Normal range of motion. No thyromegaly present.  Cardiovascular: Normal rate, regular rhythm and normal heart sounds.  Exam reveals no gallop.   No murmur heard. Pulmonary/Chest: Effort normal and breath sounds normal. No respiratory distress. She has no wheezes. She has no rales.  Musculoskeletal: She exhibits no edema.  Lymphadenopathy:    She has no cervical adenopathy.  Psychiatric: She has a normal mood and affect. Her behavior is normal.  Assessment & Plan:

## 2015-02-22 NOTE — Patient Instructions (Signed)
Please start the gabapentin with 1 capsule at bedtime x 2 days, then increase to twice a day. If you are not sleeping well after 1 week or so, add a second dose at bedtime. If you prefer, you can add the 3rd capsule as an afternoon dose

## 2015-02-22 NOTE — Assessment & Plan Note (Signed)
Sugars seem to be okay off the glipizide Will recheck A1c at follow up

## 2015-02-22 NOTE — Progress Notes (Signed)
Pre visit review using our clinic review tool, if applicable. No additional management support is needed unless otherwise documented below in the visit note. 

## 2015-02-22 NOTE — Assessment & Plan Note (Signed)
This may be the biggest issue with her pain Will try restarting the gabapentin Continue the other analgesics Doesn't seem to have pathologic dyspnea--no work up needed at this point

## 2015-02-23 ENCOUNTER — Telehealth: Payer: Self-pay

## 2015-02-23 NOTE — Telephone Encounter (Signed)
Pt left v/m; pt having problems with neuropathy and pt understands can be problem with vitamin deficiency especially Vit D. Pt wants to know if can have order for lab test to see if Vit D is low.pt request cb. Pt last seen 02/22/15.

## 2015-02-23 NOTE — Telephone Encounter (Signed)
I have not heard that vitamin D deficiency can cause neuropathy. Vitamin B12 deficiency is a common cause of it though--but we did check her levels last year I generally just recommend a daily vitamin D supplement (like 1000 int units) for women, rather than checking levels

## 2015-02-23 NOTE — Telephone Encounter (Signed)
Spoke with patient and she states she was asking about vitamin B-12  Not Vit D!!! and all the vitamin B's. I advised result from the last B-12. Pt states that was enough and she's ok now.

## 2015-02-24 NOTE — Telephone Encounter (Signed)
okay

## 2015-02-27 ENCOUNTER — Telehealth: Payer: Self-pay | Admitting: *Deleted

## 2015-02-27 NOTE — Telephone Encounter (Signed)
Pt sent a letter and report stating she has neuropathy of unknown source. Pt thinks its a deficiency in vitamins. Pt stated in letter she was prescribed gabapentin 400 mg five times daily in 2014 and stopped because she was fearful of getting addicted to it.  Per Dr. Silvio Pate he wrote on letter from pt. Gabapentin is not associated with dependence. Ok to restart for new dose 300 mg three times daily and we can increase as needed. Also acupuncture may be worth a try. Capsaicin is for more local problems like arthritis in a specific joint. Lidoderm will not help neuropathy.

## 2015-02-28 NOTE — Telephone Encounter (Signed)
That is okay Can change the prescription with the pharmacy if needed

## 2015-02-28 NOTE — Telephone Encounter (Signed)
Spoke with patient and she is taking 300 mg 5 times per day. She states it's working for now.

## 2015-03-09 ENCOUNTER — Telehealth: Payer: Self-pay

## 2015-03-09 NOTE — Telephone Encounter (Signed)
Pt left v/m that she had faxed a request for referral for acupuncture and reflexology and pt has not heard back from fax. Pt request cb when available.

## 2015-03-10 NOTE — Telephone Encounter (Signed)
I sent message to you on the fax that I don't really have someone to send her for this--- some of the area chiropractors do acupuncture--- but she will need to find them on her own (can search easily on the computer)

## 2015-03-13 NOTE — Telephone Encounter (Signed)
Left message on VM with details advised pt to call if any further questions

## 2015-03-14 ENCOUNTER — Telehealth: Payer: Self-pay

## 2015-03-14 NOTE — Telephone Encounter (Signed)
Pt left v/m; pt was told by [facility] [address] that pt is too early to get gabapentin refilled and that ins will not pay for med this early. Pt wants to know what she has to do to get gabapentin refilled. Pt request cb.

## 2015-03-15 MED ORDER — GABAPENTIN 300 MG PO CAPS: 300.0000 mg | ORAL_CAPSULE | Freq: Every day | ORAL | Status: DC

## 2015-03-15 NOTE — Telephone Encounter (Signed)
Spoke with patient and advised results rx sent to pharmacy by e-script  

## 2015-03-15 NOTE — Telephone Encounter (Signed)
Pt left v/m requesting refill gabapentin to [facility] instead of [facility] due to distance to drive. Pt request cb.

## 2015-03-15 NOTE — Telephone Encounter (Signed)
Pt just got #90 tabs on 02/22/2015, take 1 tablet 3 times daily. Spoke with patient and she's taking between 5-7 tablets daily. Per pt when she was in pain management she took up to 7-8 tablets daily for the pain, per pt she thinks this is safe. I advised that she can't change her prescription without talking with Dr. Silvio Pate, pt states Dr. Silvio Pate needs to change her prescription to reflect how she's taking this medication and allow for adjustment.

## 2015-03-15 NOTE — Telephone Encounter (Signed)
Okay to change to 5 tabs a day  #150 x 1 She should not be changing her dose on a day by day basis I need to see her in the next 1-2 months to discuss this

## 2015-03-29 ENCOUNTER — Telehealth: Payer: Self-pay

## 2015-03-29 ENCOUNTER — Encounter: Payer: Self-pay | Admitting: Internal Medicine

## 2015-03-29 ENCOUNTER — Ambulatory Visit (INDEPENDENT_AMBULATORY_CARE_PROVIDER_SITE_OTHER): Payer: Commercial Managed Care - HMO | Admitting: Internal Medicine

## 2015-03-29 VITALS — BP 110/70 | HR 66 | Temp 98.0°F | Wt 199.0 lb

## 2015-03-29 DIAGNOSIS — G8929 Other chronic pain: Secondary | ICD-10-CM | POA: Diagnosis not present

## 2015-03-29 DIAGNOSIS — M549 Dorsalgia, unspecified: Secondary | ICD-10-CM

## 2015-03-29 MED ORDER — GABAPENTIN 300 MG PO CAPS: 300.0000 mg | ORAL_CAPSULE | Freq: Every day | ORAL | Status: DC

## 2015-03-29 NOTE — Telephone Encounter (Signed)
Called patient to notify her of being due for a mammogram. Patient declined any help scheduling one at the moment. 

## 2015-03-29 NOTE — Progress Notes (Signed)
Pre visit review using our clinic review tool, if applicable. No additional management support is needed unless otherwise documented below in the visit note. 

## 2015-03-29 NOTE — Progress Notes (Signed)
   Subjective:    Patient ID: [name], female    DOB: 1945-11-10, 69 y.o.   MRN: [id number]  HPI Here due to continuing back pain Did restart the gabapentin--taking 5 daily. Takes 1 every few hours This seems to be helping Thinks this is not arthritis therefore  If she goes a few hours without, she "cramps up" Will get bad pain in left hip area in AM Eases up during day after starting the gabapentin---takes about an hour for it to kick in Not really sleepy or groggy--but it depends on how much she eats, etc Does feel she could use a nap at 2PM  Points to left>right posterior hip areas---as painful She can feel a lump there  Still very upset about the amount of time she is suffering in pain  Current Outpatient Prescriptions on File Prior to Visit  Medication Sig Dispense Refill  . gabapentin (NEURONTIN) 300 MG capsule Take 1 capsule (300 mg total) by mouth 5 (five) times daily. 150 capsule 1  . lisinopril-hydrochlorothiazide (PRINZIDE,ZESTORETIC) 20-25 MG per tablet Take 2 tablets by mouth daily. 180 tablet 3  . Multiple Vitamins-Minerals (MULTIVITAMIN WITH MINERALS) tablet Take 1 tablet by mouth daily.    . Omega 3-6-9 Fatty Acids (OMEGA 3-6-9 COMPLEX) CAPS Take 3 tablets by mouth daily.      No current facility-administered medications on file prior to visit.    No Known Allergies  Past Medical History  Diagnosis Date  . Hypertension     Past Surgical History  Procedure Laterality Date  . Cesarean section      Family History  Problem Relation Age of Onset  . Hypertension Other   . Cancer Other     ovarian  . Cancer Mother   . Heart disease Father     History   Social History  . Marital Status: Divorced    Spouse Name: N/A  . Number of Children: 4  . Years of Education: N/A   Occupational History  . Not on file.   Social History Main Topics  . Smoking status: Never Smoker   . Smokeless tobacco: Never Used  . Alcohol Use: No  . Drug Use: No  .  Sexual Activity: Not on file   Other Topics Concern  . Not on file   Social History Narrative   Has a living will.   Son is health care POA   Would desire CPR.  Would not want prolonged life support if futile.   Probably would not want tube feeds if cognitively unaware   Review of Systems Eats fine--finds comfort when she is in pain Weight is down a few pounds    Objective:   Physical Exam  Musculoskeletal:  Tenderness over L4 and left lower back Good flexion though Lots of pain with left hip internal rotation Right hip ROM is fairly normal SLR is fairly good          Assessment & Plan:

## 2015-03-29 NOTE — Assessment & Plan Note (Signed)
Ongoing problems Not really clear cut lumbar radiculopathy but seems to have neuropathic component since gabapentin is helpful Exam suggests large component of left hip arthritis--but x-ray didn't look that bad Will continue the gabapentin. Set up with physiatrist

## 2015-03-30 ENCOUNTER — Telehealth: Payer: Self-pay | Admitting: *Deleted

## 2015-03-30 NOTE — Telephone Encounter (Signed)
Pt left voicemail at Triage. Pt has an appt with Dr. Ignacia Palma to eval back pain. Pt said that they have a MRI but it's 67 years old so she requested to have another one done and his office didn't want to do one until they see the pt 1st pt said they told her "it would be a waste of your insurance money". Pt is requesting that you put an order in for her to get an updated MRI so she will have the updated MRI to discuss with Dr. Ignacia Palma at her appt on 05/07/15. Pt said it is so hard to get an appt with Dr. Ignacia Palma that she doesn't want to go there then they just say we need a MRI 1st before they do anything and then she may have to wait another month or so before she can get another appt with him, pt request call back

## 2015-03-30 NOTE — Telephone Encounter (Signed)
Spoke with patient and advised results   

## 2015-03-30 NOTE — Telephone Encounter (Signed)
I do not think we should order an MRI right away Only if he thinks it is necessary--- I really don't think she has signs of a ruptured disc and I am not sure the test is even needed

## 2015-04-09 ENCOUNTER — Other Ambulatory Visit: Payer: Self-pay

## 2015-04-09 MED ORDER — GABAPENTIN 300 MG PO CAPS: 300.0000 mg | ORAL_CAPSULE | Freq: Every day | ORAL | Status: DC

## 2015-04-09 NOTE — Telephone Encounter (Signed)
Pt was seen 03/29/15; pt request refill gabapentin to [facility].Please advise.

## 2015-04-09 NOTE — Telephone Encounter (Signed)
rx sent to pharmacy by e-script  

## 2015-04-09 NOTE — Telephone Encounter (Signed)
Approved: okay #[phone] x [phone]

## 2015-04-13 ENCOUNTER — Other Ambulatory Visit: Payer: Self-pay | Admitting: Family Medicine

## 2015-04-13 ENCOUNTER — Other Ambulatory Visit: Payer: Self-pay | Admitting: Internal Medicine

## 2015-04-16 NOTE — Telephone Encounter (Signed)
Pt said that [facility] was going to chg her $20.00 for test strips due to donut hole and pt said she had never pt for test strips. Pt will ck with ins co to see why has to pay copay this time.

## 2015-05-07 DIAGNOSIS — M4806 Spinal stenosis, lumbar region: Secondary | ICD-10-CM | POA: Diagnosis not present

## 2015-05-07 DIAGNOSIS — M5416 Radiculopathy, lumbar region: Secondary | ICD-10-CM | POA: Diagnosis not present

## 2015-05-07 DIAGNOSIS — M5136 Other intervertebral disc degeneration, lumbar region: Secondary | ICD-10-CM | POA: Diagnosis not present

## 2015-05-08 ENCOUNTER — Other Ambulatory Visit: Payer: Self-pay | Admitting: Physical Medicine and Rehabilitation

## 2015-05-08 DIAGNOSIS — M5416 Radiculopathy, lumbar region: Secondary | ICD-10-CM

## 2015-05-10 ENCOUNTER — Other Ambulatory Visit: Payer: Self-pay | Admitting: Physical Medicine and Rehabilitation

## 2015-05-10 DIAGNOSIS — M5417 Radiculopathy, lumbosacral region: Secondary | ICD-10-CM

## 2015-05-15 ENCOUNTER — Ambulatory Visit: Payer: Commercial Managed Care - HMO

## 2015-05-21 ENCOUNTER — Ambulatory Visit
Admission: RE | Admit: 2015-05-21 | Discharge: 2015-05-21 | Disposition: A | Discharge status: Home / Self Care | Payer: Commercial Managed Care - HMO | Source: Ambulatory Visit | Attending: Physical Medicine and Rehabilitation | Admitting: Physical Medicine and Rehabilitation

## 2015-05-21 DIAGNOSIS — M5417 Radiculopathy, lumbosacral region: Secondary | ICD-10-CM

## 2015-05-21 DIAGNOSIS — M2578 Osteophyte, vertebrae: Secondary | ICD-10-CM | POA: Diagnosis not present

## 2015-05-21 DIAGNOSIS — M5126 Other intervertebral disc displacement, lumbar region: Secondary | ICD-10-CM | POA: Diagnosis not present

## 2015-05-21 DIAGNOSIS — M4806 Spinal stenosis, lumbar region: Secondary | ICD-10-CM | POA: Diagnosis not present

## 2015-05-21 DIAGNOSIS — M5416 Radiculopathy, lumbar region: Secondary | ICD-10-CM | POA: Diagnosis present

## 2015-05-21 DIAGNOSIS — M4804 Spinal stenosis, thoracic region: Secondary | ICD-10-CM | POA: Insufficient documentation

## 2015-06-01 DIAGNOSIS — M5416 Radiculopathy, lumbar region: Secondary | ICD-10-CM | POA: Diagnosis not present

## 2015-06-01 DIAGNOSIS — M5136 Other intervertebral disc degeneration, lumbar region: Secondary | ICD-10-CM | POA: Diagnosis not present

## 2015-06-01 DIAGNOSIS — M4806 Spinal stenosis, lumbar region: Secondary | ICD-10-CM | POA: Diagnosis not present

## 2015-06-12 ENCOUNTER — Telehealth: Payer: Self-pay | Admitting: Internal Medicine

## 2015-06-12 NOTE — Telephone Encounter (Signed)
[facility] scheduled appt 06/19/15 at 12 noon with Dr Silvio Pate.

## 2015-06-12 NOTE — Telephone Encounter (Signed)
Please let her know that gabapentin can cause swelling--but not due to kidney problems. If it is not severe, okay to wait for her upcoming appt that was made (next week) See if she wants to come in sooner She doesn't need to stop the medication before coming in--we can discuss it at the visit

## 2015-06-12 NOTE — Telephone Encounter (Signed)
Spoke with pt and she will keep appt for next week

## 2015-06-12 NOTE — Telephone Encounter (Signed)
Patient Name: [name] DOB: October 30, 1945 Initial Comment Caller states taking gabapentin; has swollen ankles; serious? spoke w/pharmacist who adv could do something to kidneys; does not want to give up the med; anything else can take? Nurse Assessment Nurse: Marcelline Deist, RN, Lynda Date/Time (Eastern Time): 06/12/2015 9:35:09 AM Confirm and document reason for call. If symptomatic, describe symptoms. ---Caller states she is taking Gabapentin - for about a year. She has swollen ankles. She spoke with the pharmacist who advised could do something to kidneys. She does not want to give up the med. Is there anything else can take? Has the patient traveled out of the country within the last 30 days? ---Not Applicable Does the patient have any new or worsening symptoms? ---Yes Will a triage be completed? ---Yes Related visit to physician within the last 2 weeks? ---No Does the PT have any chronic conditions? (i.e. diabetes, asthma, etc.) ---Yes List chronic conditions. ---on Gabapentin, on BP rx Guidelines Guideline Title Affirmed Question Affirmed Notes Leg Swelling and Edema [1] MODERATE leg swelling (e.g., swelling extends up to knees) AND [2] new onset or worsening Final Disposition User See Physician within [place], RN, [employer] states she has had the ankle swelling for some time & wasn't concerned until speaking with pharmacist who mentioned that the Gabapentin could have an effect on her kidneys. She does not want to go off this rx because it works fro her nerve pain. When advised that patient should have this evaluated within 24 hours, she stated she wanted to give it a week & watch her salt intake before seeing her Dr. Nurse advised that this may not necessarily be related to the Gabapentin (it is a less common side effect according to [employer]), it may have more to do with her BP rx, maybe that needs adjustment. Patient wants nurse to schedule her for  next week to be seen after trying to limit her sodium. Referrals REFERRED TO PCP OFFICE Disagree/Comply: Disagree Disagree/Comply Reason: Disagree with instructions

## 2015-06-19 ENCOUNTER — Ambulatory Visit: Payer: Self-pay | Admitting: Internal Medicine

## 2015-06-26 ENCOUNTER — Telehealth: Payer: Self-pay | Admitting: Internal Medicine

## 2015-06-26 NOTE — Telephone Encounter (Signed)
Pt wants to know if exercise is good for her right now, silver sneakers program, she has previously gone to program.   Gabapentin is helping, she states she does not need any paperwork filled out.  Please advise.  Best number to call pt is [phone].  Pt is aware Dr. Silvio Pate out of office today.

## 2015-06-26 NOTE — Telephone Encounter (Signed)
I think exercise is really good but she needs to start slowly and build up. If she can walk some, that is really good -- works all muscles without straining things much. The Silver Sneakers should be mild enough for her also

## 2015-06-27 NOTE — Telephone Encounter (Signed)
Left message with details on home VM advised pt to call if any questions

## 2015-07-05 NOTE — Telephone Encounter (Signed)
Error

## 2015-07-18 ENCOUNTER — Ambulatory Visit: Payer: Commercial Managed Care - HMO | Admitting: Internal Medicine

## 2015-07-19 ENCOUNTER — Ambulatory Visit (INDEPENDENT_AMBULATORY_CARE_PROVIDER_SITE_OTHER): Payer: Commercial Managed Care - HMO | Admitting: Internal Medicine

## 2015-07-19 ENCOUNTER — Encounter: Payer: Self-pay | Admitting: Internal Medicine

## 2015-07-19 VITALS — BP 122/78 | HR 65 | Temp 98.2°F | Wt 206.0 lb

## 2015-07-19 DIAGNOSIS — G6289 Other specified polyneuropathies: Secondary | ICD-10-CM

## 2015-07-19 DIAGNOSIS — Z23 Encounter for immunization: Secondary | ICD-10-CM

## 2015-07-19 MED ORDER — GABAPENTIN 400 MG PO CAPS: 400.0000 mg | ORAL_CAPSULE | Freq: Every day | ORAL | Status: DC

## 2015-07-19 NOTE — Progress Notes (Signed)
Pre visit review using our clinic review tool, if applicable. No additional management support is needed unless otherwise documented below in the visit note. 

## 2015-07-19 NOTE — Assessment & Plan Note (Signed)
Ongoing pain but better with gabapentin--also felt low back relief with ESI from Dr Sharlet Salina She will follow up with him Will increase the gabapentin to 400mg  --- 5-6 doses per day She needs to start exercise--recommended walking for now (least toxic)

## 2015-07-19 NOTE — Addendum Note (Signed)
Addended by: Despina Hidden on: 07/19/2015 04:54 PM   Modules accepted: Orders

## 2015-07-19 NOTE — Progress Notes (Signed)
   Subjective:    Patient ID: [name], female    DOB: 08/18/1946, 69 y.o.   MRN: [id number]  HPI Here for follow up of pain  Did call about starting exercise She is worried about making things worse Hasn't started yet  Using 6 gabapentin a day---"I want 7" Takes 1 every 3 hours Doesn't last for long but it helps  Some leg weakness  Got steroid injection from Dr Sharlet Salina This helped the shooting lumbar pain  Current Outpatient Prescriptions on File Prior to Visit  Medication Sig Dispense Refill  . gabapentin (NEURONTIN) 300 MG capsule Take 1 capsule (300 mg total) by mouth 6 (six) times daily. 180 capsule 5  . glucose blood (BAYER CONTOUR NEXT TEST) test strip Use to check blood sugar once daily dx: R73.09 100 each 0  . lisinopril-hydrochlorothiazide (PRINZIDE,ZESTORETIC) 20-25 MG per tablet Take 2 tablets by mouth daily. 180 tablet 3  . Magnesium 400 MG CAPS Take by mouth daily.    . Misc Natural Products (TOTAL MEMORY & FOCUS FORMULA PO) Take by mouth daily.    . Multiple Vitamins-Minerals (MULTIVITAMIN WITH MINERALS) tablet Take 1 tablet by mouth daily.    . Omega 3-6-9 Fatty Acids (OMEGA 3-6-9 COMPLEX) CAPS Take 3 tablets by mouth daily.     Edythe Lynn Lecithin 1200 MG CAPS Take by mouth daily.     No current facility-administered medications on file prior to visit.    No Known Allergies  Past Medical History  Diagnosis Date  . Hypertension     Past Surgical History  Procedure Laterality Date  . Cesarean section      Family History  Problem Relation Age of Onset  . Hypertension Other   . Cancer Other     ovarian  . Cancer Mother   . Heart disease Father     Social History   Social History  . Marital Status: Divorced    Spouse Name: N/A  . Number of Children: 4  . Years of Education: N/A   Occupational History  . Not on file.   Social History Main Topics  . Smoking status: Never Smoker   . Smokeless tobacco: Never Used  . Alcohol Use: No  .  Drug Use: No  . Sexual Activity: Not on file   Other Topics Concern  . Not on file   Social History Narrative   Has a living will.   Son is health care POA   Would desire CPR.  Would not want prolonged life support if futile.   Probably would not want tube feeds if cognitively unaware   Review of Systems Some trouble with left shoulder now--gets "electricity" feeling in left upper back Not in arm No arm weakness    Objective:   Physical Exam  Constitutional: She appears well-developed. No distress.  Neurological:  Normal gait SLR negative No focal leg weakness          Assessment & Plan:

## 2015-08-21 ENCOUNTER — Telehealth: Payer: Self-pay

## 2015-08-21 NOTE — Telephone Encounter (Signed)
Spoke with pt; pt thinks eye is getting better and does not want to go to UC. Pt said she would like to see Dr Silvio Pate 08/22/15; appt scheduled 08/22/15 at 11:15. Pt voiced understanding.if condition changes or worsens prior to appt pt will go to UC.

## 2015-08-21 NOTE — Telephone Encounter (Signed)
PLEASE NOTE: All timestamps contained within this report are represented as [place] Standard Time. CONFIDENTIALTY NOTICE: This fax transmission is intended only for the addressee. It contains information that is legally privileged, confidential or otherwise protected from use or disclosure. If you are not the intended recipient, you are strictly prohibited from reviewing, disclosing, copying using or disseminating any of this information or taking any action in reliance on or regarding this information. If you have received this fax in error, please notify us immediately by telephone so that we can arrange for its return to [place]. Phone: [phone], Toll-Free: [phone], Fax: [phone] Page: 1 of 2 Call Id: [id number] [place] Patient Name: [name] Gender: Female DOB: [birth year] Age: 69 Y 3 M 17 D Return Phone Number: [phone] (Primary) Address: [address] City/State/Zip: [place] [address] Client [facility] - Client Client Site [place] - Day Physician Viviana Simpler Contact Type Call Call Type Triage / Clinical Relationship To Patient Self Appointment Disposition EMR Appointment Attempted - Not Scheduled Info pasted into Epic No Return Phone Number [phone] (Primary) Chief Complaint Eye Redness Initial Comment Caller states thinks she has a sty on her eye for maybe a week or 10 days. PreDisposition Home Care Nurse Assessment Nurse: Thad Ranger, RN, Langley Gauss Date/Time Eilene Ghazi Time): 08/21/2015 3:07:31 PM Confirm and document reason for call. If symptomatic, describe symptoms. ---Caller states thinks she has a sty on her left eye for maybe a week or 10 days. States she popped the pustule bump and has been using Sty ointment OTC. Site improving. Has the patient traveled out of the country within the last  30 days? ---Not Applicable Does the patient have any new or worsening symptoms? ---Yes Will a triage be completed? ---Yes Related visit to physician within the last 2 weeks? ---No Does the PT have any chronic conditions? (i.e. diabetes, asthma, etc.) ---No Is this a behavioral health or substance abuse call? ---No Guidelines Guideline Title Affirmed Question Affirmed Notes Nurse Date/Time (Eastern Time) Sty [1] Blurred vision AND [2] new or worsening Carmon, RN, Langley Gauss 08/21/2015 3:09:02 PM Disp. Time Eilene Ghazi Time) Disposition Final User 08/21/2015 3:20:06 PM Call Completed Thad Ranger, RN, Langley Gauss 08/21/2015 3:14:01 PM See Physician within 4 Hours (or PCP triage) Yes Carmon, RN, Yevette Edwards Understands: Yes PLEASE NOTE: All timestamps contained within this report are represented as [place] Standard Time. CONFIDENTIALTY NOTICE: This fax transmission is intended only for the addressee. It contains information that is legally privileged, confidential or otherwise protected from use or disclosure. If you are not the intended recipient, you are strictly prohibited from reviewing, disclosing, copying using or disseminating any of this information or taking any action in reliance on or regarding this information. If you have received this fax in error, please notify us immediately by telephone so that we can arrange for its return to [place]. Phone: [phone], Toll-Free: [phone], Fax: [phone] Page: 2 of 2 Call Id: [id number] Disagree/Comply: Disagree Disagree/Comply Reason: Wait and see Care Advice Given Per Guideline SEE PHYSICIAN WITHIN 4 HOURS (or PCP triage): CALL BACK IF: * You become worse. CARE ADVICE given per Sty (Adult) guideline. After Care Instructions Given Call Event Type User Date / Time Description Comments User: Romeo Apple, RN Date/Time Eilene Ghazi Time): 08/21/2015 3:19:44 PM [place], [facility] EMR down today and unable to access to make appt or copy  notes. Referrals GO TO FACILITY REFUSED

## 2015-08-22 ENCOUNTER — Ambulatory Visit: Payer: Self-pay | Admitting: Internal Medicine

## 2015-08-22 NOTE — Telephone Encounter (Signed)
Okay Will see then 

## 2015-08-24 ENCOUNTER — Telehealth: Payer: Self-pay

## 2015-08-24 NOTE — Telephone Encounter (Signed)
Pt left v/m requesting appt for tb skin test. Left v/m requesting pt to cb.

## 2015-08-30 DIAGNOSIS — M5136 Other intervertebral disc degeneration, lumbar region: Secondary | ICD-10-CM | POA: Diagnosis not present

## 2015-08-30 DIAGNOSIS — M4806 Spinal stenosis, lumbar region: Secondary | ICD-10-CM | POA: Diagnosis not present

## 2015-08-30 DIAGNOSIS — M5416 Radiculopathy, lumbar region: Secondary | ICD-10-CM | POA: Diagnosis not present

## 2015-09-01 ENCOUNTER — Other Ambulatory Visit: Payer: Self-pay | Admitting: Internal Medicine

## 2015-09-07 NOTE — Telephone Encounter (Signed)
Left v/m requesting cb.

## 2015-09-07 NOTE — Telephone Encounter (Signed)
Pt request first available TB skin test for work; pt scheduled 09/12/2015 at 3:30.pt voiced understanding.

## 2015-09-12 ENCOUNTER — Ambulatory Visit (INDEPENDENT_AMBULATORY_CARE_PROVIDER_SITE_OTHER): Payer: Commercial Managed Care - HMO | Admitting: *Deleted

## 2015-09-12 DIAGNOSIS — Z111 Encounter for screening for respiratory tuberculosis: Secondary | ICD-10-CM

## 2015-09-14 ENCOUNTER — Encounter: Payer: Self-pay | Admitting: *Deleted

## 2015-09-14 LAB — TB SKIN TEST
Induration: 0 mm
TB Skin Test: NEGATIVE

## 2015-09-17 ENCOUNTER — Encounter: Payer: Self-pay | Admitting: Internal Medicine

## 2015-09-17 ENCOUNTER — Ambulatory Visit (INDEPENDENT_AMBULATORY_CARE_PROVIDER_SITE_OTHER): Payer: Commercial Managed Care - HMO | Admitting: Internal Medicine

## 2015-09-17 VITALS — BP 120/70 | HR 86 | Temp 97.9°F | Wt 211.0 lb

## 2015-09-17 DIAGNOSIS — R7303 Prediabetes: Secondary | ICD-10-CM | POA: Diagnosis not present

## 2015-09-17 DIAGNOSIS — H578 Other specified disorders of eye and adnexa: Secondary | ICD-10-CM

## 2015-09-17 DIAGNOSIS — H5789 Other specified disorders of eye and adnexa: Secondary | ICD-10-CM | POA: Insufficient documentation

## 2015-09-17 LAB — HEMOGLOBIN A1C: HEMOGLOBIN A1C: 6.7 % — AB (ref 4.6–6.5)

## 2015-09-17 MED ORDER — GLUCOSE BLOOD VI STRP: ORAL_STRIP | Status: DC

## 2015-09-17 NOTE — Assessment & Plan Note (Signed)
Running higher lately (since Christmas) Will recheck A1c --will start metformin if sig increase

## 2015-09-17 NOTE — Assessment & Plan Note (Signed)
May be stye that hasn't resolved completely or chalazion Does have upcoming eye appt and it can be evaluated then

## 2015-09-17 NOTE — Progress Notes (Signed)
   Subjective:    Patient ID: [name], female    DOB: April 17, 1946, 70 y.o.   MRN: [id number]  HPI Here due to ongoing back pain Did get a second ESI from Dr Donita Brooks didn't do anything and tried the second Left foot tingling did improve with the gabapentin Overall her pain is better---maybe the second did help more  Has lump on lower right lid for a month or so No pain Hasn't drained but is smaller and less red  Current Outpatient Prescriptions on File Prior to Visit  Medication Sig Dispense Refill  . gabapentin (NEURONTIN) 400 MG capsule Take 1 capsule (400 mg total) by mouth 6 (six) times daily. 240 capsule 3  . lisinopril-hydrochlorothiazide (PRINZIDE,ZESTORETIC) 20-25 MG tablet TAKE 2 TABLETS BY MOUTH DIALY 180 tablet 1  . Misc Natural Products (TOTAL MEMORY & FOCUS FORMULA PO) Take by mouth daily.    . Multiple Vitamins-Minerals (MULTIVITAMIN WITH MINERALS) tablet Take 1 tablet by mouth daily.    Edythe Lynn Lecithin 1200 MG CAPS Take by mouth daily.     No current facility-administered medications on file prior to visit.    No Known Allergies  Past Medical History  Diagnosis Date  . Hypertension     Past Surgical History  Procedure Laterality Date  . Cesarean section      Family History  Problem Relation Age of Onset  . Hypertension Other   . Cancer Other     ovarian  . Cancer Mother   . Heart disease Father     Social History   Social History  . Marital Status: Divorced    Spouse Name: N/A  . Number of Children: 4  . Years of Education: N/A   Occupational History  . Not on file.   Social History Main Topics  . Smoking status: Never Smoker   . Smokeless tobacco: Never Used  . Alcohol Use: No  . Drug Use: No  . Sexual Activity: Not on file   Other Topics Concern  . Not on file   Social History Narrative   Has a living will.   Son is health care POA   Would desire CPR.  Would not want prolonged life support if futile.   Probably would  not want tube feeds if cognitively unaware   Review of Systems Her GI Dr Amedeo Plenty recommended hemoccults though negative colonoscopy 12/11 Checks sugars weekly--- 134/136 on past 2 times (fasting)    Objective:   Physical Exam  Eyes:  1-27mm nodular mass on lower left lid Not really inflamed ??small lump inside upper lid also??          Assessment & Plan:

## 2015-09-18 ENCOUNTER — Other Ambulatory Visit: Payer: Self-pay | Admitting: *Deleted

## 2015-09-18 MED ORDER — METFORMIN HCL ER 500 MG PO TB24: 500.0000 mg | ORAL_TABLET | Freq: Every day | ORAL | Status: DC

## 2015-09-19 ENCOUNTER — Other Ambulatory Visit: Payer: Self-pay | Admitting: Internal Medicine

## 2015-09-19 DIAGNOSIS — E119 Type 2 diabetes mellitus without complications: Secondary | ICD-10-CM

## 2015-09-21 DIAGNOSIS — H5210 Myopia, unspecified eye: Secondary | ICD-10-CM | POA: Diagnosis not present

## 2015-09-21 DIAGNOSIS — H524 Presbyopia: Secondary | ICD-10-CM | POA: Diagnosis not present

## 2015-09-26 DIAGNOSIS — M4806 Spinal stenosis, lumbar region: Secondary | ICD-10-CM | POA: Diagnosis not present

## 2015-09-26 DIAGNOSIS — M5136 Other intervertebral disc degeneration, lumbar region: Secondary | ICD-10-CM | POA: Diagnosis not present

## 2015-09-26 DIAGNOSIS — M5416 Radiculopathy, lumbar region: Secondary | ICD-10-CM | POA: Diagnosis not present

## 2015-10-01 ENCOUNTER — Telehealth: Payer: Self-pay | Admitting: Internal Medicine

## 2015-10-01 ENCOUNTER — Encounter: Payer: Self-pay | Admitting: Dietician

## 2015-10-01 ENCOUNTER — Encounter: Payer: Commercial Managed Care - HMO | Attending: Internal Medicine | Admitting: Dietician

## 2015-10-01 VITALS — BP 116/76 | Ht 64.0 in | Wt 208.3 lb

## 2015-10-01 DIAGNOSIS — E119 Type 2 diabetes mellitus without complications: Secondary | ICD-10-CM | POA: Diagnosis not present

## 2015-10-01 NOTE — Telephone Encounter (Signed)
Opened in error

## 2015-10-01 NOTE — Patient Instructions (Signed)
  Check blood sugars 2 x day before breakfast and 2 hrs after supper every day Exercise:  Increase walking on treadmill to 2-3x/wk. if able-15-30 min Avoid sugar sweetened drinks (soda, tea, coffee, sports drinks, juices) and sweets/desserts Drink plenty of water Eat 3 meals day,   1  snacks a day at bedtime Space meals 4-6 hours apart Call your doctor for a prescription for test strips and lancets:    checking  2 times per day    checking  2   times per day Return for appointment/classes on:  10-18-15

## 2015-10-01 NOTE — Progress Notes (Signed)
Diabetes Self-Management Education  Visit Type: First/Initial  Appt. Start Time: 1345 Appt. End Time: [id number]  10/01/2015  [name], identified by name and date of birth, is a 70 y.o. female with a diagnosis of Diabetes: Type 2.   ASSESSMENT  Blood pressure 116/76, height 5\' 4"  (1.626 m), weight 208 lb 4.8 oz (94.484 kg). Body mass index is 35.74 kg/(m^2). Lacks knowledge of diabetes care and healthy diet c/o occasional mild tingling in legs and feet  c/o chronic back pain which limits ability to exercise     Diabetes Self-Management Education - 10/01/15 1530    Visit Information   Visit Type First/Initial   Initial Visit   Diabetes Type Type 2   Health Coping   How would you rate your overall health? Fair   Psychosocial Assessment   Patient Belief/Attitude about Diabetes --  unsure if ready to make changes    Self-care barriers None   Patient Concerns Weight Control;Glycemic Control;Healthy Lifestyle;Nutrition/Meal planning;Medication  become more fit   Special Needs None   Preferred Learning Style Auditory;Hands on   Learning Readiness Contemplating   What is the last grade level you completed in school? associate degree   Complications   Last HgB A1C per patient/outside source 6.7 %  09-17-15   How often do you check your blood sugar? --  checks FBG 1x/wk   Fasting Blood glucose range (mg/dL) 70-129   Have you had a dilated eye exam in the past 12 months? Yes   Have you had a dental exam in the past 12 months? Yes   Are you checking your feet? No   Dietary Intake   Breakfast --  eats 3 meals/day and 2 snacks; has decreased intake of sweets and carbohydrates   Snack (afternoon) --  eats snack at 3p=fruit/p-nut butter, nuts   Snack (evening) --  eats snack=fruit & p-nut butter, nuts   Beverage(s) --  drinks 1-2 glasses of water/day and 2-3 unsweetened  beverages/day   Exercise   Exercise Type ADL's  walks on treadmill 30 min 1x/wk=exercise limited due to  chronic back pain from spinal stenosis   How many days per week to you exercise? 1   How many minutes per day do you exercise? 30   Total minutes per week of exercise 30   Patient Education   Previous Diabetes Education No   Disease state  --  definition of type 2 diabetes and treatment options   Nutrition management  Role of diet in the treatment of diabetes and the relationship between the three main macronutrients and blood glucose level;Food label reading, portion sizes and measuring food.;Carbohydrate counting   Physical activity and exercise  Role of exercise on diabetes management, blood pressure control and cardiac health.  encouraged to increase walking on treatmill to 2-3x/wk if able   Medications Reviewed patients medication for diabetes, action, purpose, timing of dose and side effects.   Monitoring Purpose and frequency of SMBG.;Taught/discussed recording of test results and interpretation of SMBG.;Identified appropriate SMBG and/or A1C goals.;Yearly dilated eye exam   Chronic complications Retinopathy and reason for yearly dilated eye exams;Dental care   Psychosocial adjustment Role of stress on diabetes   Personal strategies to promote health Lifestyle issues that need to be addressed for better diabetes care;Helped patient develop diabetes management plan for (enter comment)      Individualized Plan for Diabetes Self-Management Training:   Learning Objective:  Patient will have a greater understanding of diabetes self-management. Patient education plan  is to attend individual and/or group sessions per assessed needs and concerns.   Plan:   Patient Instructions   Check blood sugars 2 x day before breakfast and 2 hrs after supper every day Exercise:  Increase walking on treadmill to 2-3x/wk. if able-15-30 min Avoid sugar sweetened drinks (soda, tea, coffee, sports drinks, juices) and sweets/desserts Drink plenty of water Eat 3 meals day,   1  snacks a day at  bedtime Space meals 4-6 hours apart Call your doctor for a prescription for your test strips and lancets:    Check    2 times per day    Check   2   times per day Return for appointment/classes on:  10-18-15   Expected Outcomes:     Education material provided: General Meal Planning Guidelines  If problems or questions, patient to contact team via:  [phone]  Future DSME appointment:  10-18-15

## 2015-10-03 ENCOUNTER — Telehealth: Payer: Self-pay | Admitting: *Deleted

## 2015-10-03 MED ORDER — GLUCOSE BLOOD VI STRP: ORAL_STRIP | Status: DC

## 2015-10-03 MED ORDER — ACCU-CHEK AVIVA PLUS W/DEVICE KIT: PACK | Status: DC

## 2015-10-04 NOTE — Telephone Encounter (Signed)
Pt request new meter and test strips; advised pt was already done on 10/03/15 and pt voiced understanding and will ck with pharmacy.

## 2015-10-11 NOTE — Telephone Encounter (Signed)
Pt called back and advised per note; Crystal said [facility] diabetic supplies did not require rx; Pt voiced understanding and pt will decide what she is going to do.

## 2015-10-11 NOTE — Telephone Encounter (Addendum)
Pt left v/m requesting to change test strips to Contour next strips; pt is testing bid. Spoke with Crystal at [facility] and cost to pt was $78.00 which was improvement from $93.00. Crystal said ins will not cover contour next strips. Crystal said if pt wants less expensive than accu chek will need to pay out of pocket for [facility] brand; the meter is approx $15.00 and the strips are $9.00. Left v/m requesting pt to cb.

## 2015-10-12 DIAGNOSIS — Z6835 Body mass index (BMI) 35.0-35.9, adult: Secondary | ICD-10-CM | POA: Diagnosis not present

## 2015-10-12 DIAGNOSIS — M4316 Spondylolisthesis, lumbar region: Secondary | ICD-10-CM | POA: Diagnosis not present

## 2015-10-12 DIAGNOSIS — M5136 Other intervertebral disc degeneration, lumbar region: Secondary | ICD-10-CM | POA: Diagnosis not present

## 2015-10-12 DIAGNOSIS — M4806 Spinal stenosis, lumbar region: Secondary | ICD-10-CM | POA: Diagnosis not present

## 2015-10-18 ENCOUNTER — Ambulatory Visit: Payer: Self-pay

## 2015-10-18 ENCOUNTER — Telehealth: Payer: Self-pay | Admitting: Dietician

## 2015-10-18 NOTE — Telephone Encounter (Signed)
Called patient to see if she wanted to reschedule diabetes classes. She did not show for Class 1 on 10/18/15. She rescheduled for 10/29/15, 11/05/15 and 11/12/15.

## 2015-10-25 ENCOUNTER — Other Ambulatory Visit: Payer: Self-pay | Admitting: Neurosurgery

## 2015-10-25 ENCOUNTER — Ambulatory Visit: Payer: Self-pay

## 2015-10-29 ENCOUNTER — Encounter: Payer: Self-pay | Admitting: Dietician

## 2015-10-29 ENCOUNTER — Ambulatory Visit: Payer: Self-pay

## 2015-10-29 NOTE — Progress Notes (Signed)
Pt called to cancel diabetes classes-does not want to reschedule them-will discharge and send letter  to MD

## 2015-10-29 NOTE — Pre-Procedure Instructions (Signed)
Lachae Rhynes Schlee  10/29/2015      [facility] [address], [id number] [place] [address] Phone: [phone] Fax: [phone]  [id number] [place], [address] [place] [address] Phone: [phone] Fax: [phone]    Your procedure is scheduled on Wednesday, March 1st, 2017.  Report to [facility] Admitting at 9:30 A.M.  Call this number if you have problems the morning of surgery:  [phone]   Remember:  Do not eat food or drink liquids after midnight.   Take these medicines the morning of surgery with A SIP OF WATER: Gabapentin (Neurontin).  What do I do about my diabetes medications?   Do not take oral diabetes medicines (pills) the morning of surgery.   Stop taking: Aspirin, NSAIDS, Aleve, Naproxen, Ibuprofen, Advil, Motrin, BC's, Goody's, Fish oil, all herbal medications, and all vitamins.    Do not wear jewelry, make-up or nail polish.  Do not wear lotions, powders, or perfumes.   Do not shave 48 hours prior to surgery.    Do not bring valuables to the hospital.   [facility] is not responsible for any belongings or valuables.  Contacts, dentures or bridgework may not be worn into surgery.  Leave your suitcase in the car.  After surgery it may be brought to your room.  For patients admitted to the hospital, discharge time will be determined by your treatment team.  Patients discharged the day of surgery will not be allowed to drive home.   Special instructions:  See attached.   Please read over the following fact sheets that you were given. Pain Booklet, Coughing and Deep Breathing, Blood Transfusion Information, MRSA Information and Surgical Site Infection Prevention      How to Manage Your Diabetes Before Surgery   Why is it important to control my blood sugar before and after surgery?   Improving blood sugar levels before and after surgery  helps healing and can limit problems.  A way of improving blood sugar control is eating a healthy diet by:  - Eating less sugar and carbohydrates  - Increasing activity/exercise  - Talk with your doctor about reaching your blood sugar goals  High blood sugars (greater than 180 mg/dL) can raise your risk of infections and slow down your recovery so you will need to focus on controlling your diabetes during the weeks before surgery.  Make sure that the doctor who takes care of your diabetes knows about your planned surgery including the date and location.  How do I manage my blood sugars before surgery?   Check your blood sugar at least 4 times a day, 2 days before surgery to make sure that they are not too high or low.   Check your blood sugar the morning of your surgery when you wake up and every 2 hours until you get to the Short-Stay unit.  If your blood sugar is less than 70 mg/dL, you will need to treat for low blood sugar by:  Treat a low blood sugar (less than 70 mg/dL) with 1/2 cup of clear juice (cranberry or apple), 4 glucose tablets, OR glucose gel.  Recheck blood sugar in 15 minutes after treatment (to make sure it is greater than 70 mg/dL).  If blood sugar is not greater than 70 mg/dL on re-check, call [phone] for further instructions.   Report your blood sugar to the Short-Stay nurse when you get to  Short-Stay.  References:  [facility], 2007 "How to Manage your Diabetes Before and After Surgery".

## 2015-10-30 ENCOUNTER — Other Ambulatory Visit: Payer: Self-pay

## 2015-10-30 ENCOUNTER — Encounter (HOSPITAL_COMMUNITY): Payer: Self-pay

## 2015-10-30 ENCOUNTER — Encounter (HOSPITAL_COMMUNITY)
Admission: RE | Admit: 2015-10-30 | Discharge: 2015-10-30 | Disposition: A | Discharge status: Home / Self Care | Payer: Commercial Managed Care - HMO | Source: Ambulatory Visit | Attending: Neurosurgery | Admitting: Neurosurgery

## 2015-10-30 DIAGNOSIS — Z79899 Other long term (current) drug therapy: Secondary | ICD-10-CM | POA: Diagnosis not present

## 2015-10-30 DIAGNOSIS — I10 Essential (primary) hypertension: Secondary | ICD-10-CM | POA: Insufficient documentation

## 2015-10-30 DIAGNOSIS — R413 Other amnesia: Secondary | ICD-10-CM | POA: Diagnosis not present

## 2015-10-30 DIAGNOSIS — Z01812 Encounter for preprocedural laboratory examination: Secondary | ICD-10-CM | POA: Diagnosis not present

## 2015-10-30 DIAGNOSIS — Z01818 Encounter for other preprocedural examination: Secondary | ICD-10-CM | POA: Diagnosis not present

## 2015-10-30 DIAGNOSIS — Z0183 Encounter for blood typing: Secondary | ICD-10-CM | POA: Diagnosis not present

## 2015-10-30 DIAGNOSIS — M4316 Spondylolisthesis, lumbar region: Secondary | ICD-10-CM | POA: Diagnosis not present

## 2015-10-30 DIAGNOSIS — E119 Type 2 diabetes mellitus without complications: Secondary | ICD-10-CM | POA: Insufficient documentation

## 2015-10-30 DIAGNOSIS — Z7984 Long term (current) use of oral hypoglycemic drugs: Secondary | ICD-10-CM | POA: Diagnosis not present

## 2015-10-30 HISTORY — DX: Disorientation, unspecified: R41.0

## 2015-10-30 HISTORY — DX: Nocturia: R35.1

## 2015-10-30 HISTORY — DX: Other amnesia: R41.3

## 2015-10-30 HISTORY — DX: Inflammatory liver disease, unspecified: K75.9

## 2015-10-30 HISTORY — DX: Presence of spectacles and contact lenses: Z97.3

## 2015-10-30 HISTORY — DX: Type 2 diabetes mellitus without complications: E11.9

## 2015-10-30 HISTORY — DX: Anesthesia of skin: R20.0

## 2015-10-30 HISTORY — DX: Paresthesia of skin: R20.2

## 2015-10-30 LAB — COMPREHENSIVE METABOLIC PANEL
ALBUMIN: 3.8 g/dL (ref 3.5–5.0)
ALK PHOS: 51 U/L (ref 38–126)
ALT: 17 U/L (ref 14–54)
AST: 19 U/L (ref 15–41)
Anion gap: 12 (ref 5–15)
BUN: 23 mg/dL — AB (ref 6–20)
CALCIUM: 10.1 mg/dL (ref 8.9–10.3)
CO2: 28 mmol/L (ref 22–32)
CREATININE: 0.98 mg/dL (ref 0.44–1.00)
Chloride: 101 mmol/L (ref 101–111)
GFR calc Af Amer: >60 mL/min (ref >60)
GFR calc non Af Amer: 58 mL/min — ABNORMAL LOW (ref >60)
GLUCOSE: 95 mg/dL (ref 65–99)
Potassium: 4.5 mmol/L (ref 3.5–5.1)
SODIUM: 141 mmol/L (ref 135–145)
Total Bilirubin: 0.4 mg/dL (ref 0.3–1.2)
Total Protein: 6.6 g/dL (ref 6.5–8.1)

## 2015-10-30 LAB — CBC
HEMATOCRIT: 47 % — AB (ref 36.0–46.0)
HEMOGLOBIN: 15.2 g/dL — AB (ref 12.0–15.0)
MCH: 31.8 pg (ref 26.0–34.0)
MCHC: 32.3 g/dL (ref 30.0–36.0)
MCV: 98.3 fL (ref 78.0–100.0)
Platelets: 242 10*3/uL (ref 150–400)
RBC: 4.78 MIL/uL (ref 3.87–5.11)
RDW: 13.6 % (ref 11.5–15.5)
WBC: 10.3 10*3/uL (ref 4.0–10.5)

## 2015-10-30 LAB — SURGICAL PCR SCREEN
MRSA, PCR: NEGATIVE
Staphylococcus aureus: NEGATIVE

## 2015-10-30 LAB — GLUCOSE, CAPILLARY: Glucose-Capillary: 93 mg/dL (ref 65–99)

## 2015-10-30 LAB — TYPE AND SCREEN
ABO/RH(D): O POS
Antibody Screen: NEGATIVE

## 2015-10-30 LAB — ABO/RH: ABO/RH(D): O POS

## 2015-10-30 NOTE — Progress Notes (Signed)
PCP - Dr. Viviana Simpler Cardiologist - denies  EKG - 10/30/15 CXR - denies  Echo/stress test/cardiac cath - denies  Patient denies chest pain and shortness of breath at PAT appointment.  Patient states her blood sugar once a day and states her fasting is 126.  Patient educated on importance of checking blood sugar 4 times a day, 2 days prior to surgery and every 2 hours day of surgery.  Patient verbalized understanding.   Patient became very tearful when asked if she feels hopeless.  Patient shared with nurse about feelings about surgery and disruption of support systems.  Patient stated that she would like a spiritual consult while she is in the hospital after surgery.

## 2015-10-31 ENCOUNTER — Other Ambulatory Visit: Payer: Self-pay | Admitting: *Deleted

## 2015-10-31 ENCOUNTER — Telehealth: Payer: Self-pay | Admitting: Internal Medicine

## 2015-10-31 DIAGNOSIS — Z01810 Encounter for preprocedural cardiovascular examination: Secondary | ICD-10-CM

## 2015-10-31 LAB — HEMOGLOBIN A1C
Hgb A1c MFr Bld: 6.4 % — ABNORMAL HIGH (ref 4.8–5.6)
Mean Plasma Glucose: 137 mg/dL

## 2015-10-31 MED ORDER — BAYER MICROLET LANCETS MISC: Status: DC

## 2015-10-31 NOTE — Telephone Encounter (Signed)
Message from anaesthesia also Had abnormal EKG with new RBBB Have recommended cardiology eval pre op ---- not typical with ischemia but is possible Left her message

## 2015-10-31 NOTE — Progress Notes (Addendum)
Anesthesia Chart Review:  Pt is a 70 year old female scheduled for L3-4, L4-5 PLIF on 11/07/2015 with Dr. Arnoldo Morale.   PCP is Dr. Viviana Simpler.   PMH includes:  HTN, DM, memory loss, hepatitis. Never smoker. BMI 36  Medications include: lisinopril-hctz, metformin  Preoperative labs reviewed.  HgbA1c 6.4, glucose 95  EKG 10/30/15: Sinus bradycardia (59 bpm). RBBB. Minimal voltage criteria for LVH, may be normal variant. T wave abnormality, consider lateral ischemia.  Lateral T wave changes are new.   Reviewed case with Dr. Gifford Shave. I have routed EKG to Dr. Silvio Pate for review.   Willeen Cass, FNP-BC [facility]/Anesthesiology Phone: [phone] 10/31/2015 11:59 AM  Addendum:  Dr. Silvio Pate sent pt to cardiologist Dr. Ida Rogue for pre-op eval. Dr. Rockey Situ has cleared pt for surgery (Epic note dated 11/05/15).   If no changes, I anticipate pt can proceed with surgery as scheduled.   Willeen Cass, FNP-BC [facility] Stay [facility]/Anesthesiology Phone: [phone] 11/06/2015 12:47 PM

## 2015-10-31 NOTE — Telephone Encounter (Signed)
Patient called to let Dr.Letvak know she's having back surgery on 11/07/15.

## 2015-11-01 ENCOUNTER — Ambulatory Visit: Payer: Self-pay

## 2015-11-01 DIAGNOSIS — M4316 Spondylolisthesis, lumbar region: Secondary | ICD-10-CM | POA: Diagnosis not present

## 2015-11-05 ENCOUNTER — Ambulatory Visit (INDEPENDENT_AMBULATORY_CARE_PROVIDER_SITE_OTHER): Payer: Commercial Managed Care - HMO | Admitting: Cardiovascular Disease

## 2015-11-05 ENCOUNTER — Ambulatory Visit: Payer: Self-pay

## 2015-11-05 ENCOUNTER — Encounter: Payer: Self-pay | Admitting: Cardiovascular Disease

## 2015-11-05 VITALS — BP 128/92 | HR 67 | Ht 64.0 in | Wt 209.2 lb

## 2015-11-05 DIAGNOSIS — Z01810 Encounter for preprocedural cardiovascular examination: Secondary | ICD-10-CM | POA: Diagnosis not present

## 2015-11-05 DIAGNOSIS — G8929 Other chronic pain: Secondary | ICD-10-CM

## 2015-11-05 DIAGNOSIS — M549 Dorsalgia, unspecified: Secondary | ICD-10-CM

## 2015-11-05 DIAGNOSIS — R9431 Abnormal electrocardiogram [ECG] [EKG]: Secondary | ICD-10-CM | POA: Insufficient documentation

## 2015-11-05 DIAGNOSIS — R7303 Prediabetes: Secondary | ICD-10-CM

## 2015-11-05 DIAGNOSIS — I498 Other specified cardiac arrhythmias: Secondary | ICD-10-CM

## 2015-11-05 DIAGNOSIS — I10 Essential (primary) hypertension: Secondary | ICD-10-CM

## 2015-11-05 NOTE — Progress Notes (Signed)
Patient ID: [name], female    DOB: [birth year], 70 y.o.   MRN: [id number]  HPI Comments: [name] is a very pleasant 70 year old woman with history of chronic back pain, glucose intolerance/diabetes type 2, hypertension, who presents by referral from Dr. Arnoldo Morale, neurosurgery , for preoperative evaluation prior to back surgery in several days' time in [place], and for abnormal EKG  She reports that she's never had any significant cardiac issues In general she is very active but limited by her chronic back pain More active when she takes her gabapentin. She did not tolerate pain medication but does find relief with gabapentin. Rarely has fluttering in her chest lasting for 10 seconds or so, happens several times per year otherwise feels well most of the time.  Reports she has relatively good level of performance, able to do all of her ADLs, all of her housework. Sometimes has to slow down when her back hurts.  Denies any smoking history, Reports hemoglobin A1c 6.6 for which she takes metformin. She is changing her diet, low carbohydrates She reports her blood pressure has been relatively well-controlled at home  EKG on today's visit shows normal sinus rhythm with right bundle branch block, left axis deviation Prior EKGs from 2014 showing no bundle branch block at that time     No Known Allergies  Current Outpatient Prescriptions on File Prior to Visit  Medication Sig Dispense Refill  . Ascorbic Acid (VITAMIN C WITH ROSE HIPS) 1000 MG tablet Take 1,000 mg by mouth daily.    Marland Kitchen BAYER MICROLET LANCETS lancets Use as instructed to test blood sugar once daily dx: R73.03 100 each 1  . Blood Glucose Monitoring Suppl (ACCU-CHEK AVIVA PLUS) w/Device KIT Use to test blood sugar once daily Dx: R73.03 1 kit 0  . CALCIUM-MAGNESIUM PO Take 2 tablets by mouth daily.    Marland Kitchen gabapentin (NEURONTIN) 800 MG tablet Take 800 mg by mouth 4 (four) times daily.     Marland Kitchen glucose blood (ACCU-CHEK  AVIVA PLUS) test strip Use to test blood sugar once daily Dx: R73.03 100 each 1  . ibuprofen (ADVIL,MOTRIN) 200 MG tablet Take 200-400 mg by mouth every 8 (eight) hours as needed for mild pain or moderate pain. As needed for back pain    . lisinopril-hydrochlorothiazide (PRINZIDE,ZESTORETIC) 20-25 MG tablet TAKE 2 TABLETS BY MOUTH DIALY 180 tablet 1  . metFORMIN (GLUCOPHAGE XR) 500 MG 24 hr tablet Take 1 tablet (500 mg total) by mouth daily with breakfast. 90 tablet 3  . Multiple Vitamins-Minerals (MULTIVITAMIN WITH MINERALS) tablet Take 1 tablet by mouth daily.    . Omega-3 Fatty Acids (FISH OIL) 1000 MG CAPS Take 1 capsule by mouth daily.    Marland Kitchen OVER THE COUNTER MEDICATION Take 2 tablets by mouth daily. Focus Formula    . Soya Lecithin 1200 MG CAPS Take 1 tablet by mouth daily.      No current facility-administered medications on file prior to visit.    Past Medical History  Diagnosis Date  . Hypertension   . Arthritis   . Lumbar stenosis   . Diabetes mellitus without complication ([place])   . Nocturia   . Confusion   . Wears glasses   . Memory loss   . Numbness and tingling     legs and feet  . Hepatitis     Past Surgical History  Procedure Laterality Date  . Cesarean section  1978  . Hemorrhoid surgery  2015  . Colonoscopy  Social History  reports that she has never smoked. She has never used smokeless tobacco. She reports that she does not drink alcohol or use illicit drugs.  Family History family history includes Cancer in her mother and other; Heart disease in her father; Hypertension in her other.    Review of Systems  Constitutional: Negative.   Respiratory: Negative.   Cardiovascular: Negative.   Gastrointestinal: Negative.   Musculoskeletal: Positive for back pain.  Neurological: Negative.   Hematological: Negative.   Psychiatric/Behavioral: Negative.   All other systems reviewed and are negative.   BP 128/92 mmHg  Pulse 67  Ht '5\' 4"'  (1.626 m)  Wt  209 lb 4 oz (94.915 kg)  BMI 35.90 kg/m2  Physical Exam  Constitutional: She is oriented to person, place, and time. She appears well-developed and well-nourished.  HENT:  Head: Normocephalic.  Nose: Nose normal.  Mouth/Throat: Oropharynx is clear and moist.  Eyes: Conjunctivae are normal. Pupils are equal, round, and reactive to light.  Neck: Normal range of motion. Neck supple. No JVD present.  Cardiovascular: Normal rate, regular rhythm, normal heart sounds and intact distal pulses.  Exam reveals no gallop and no friction rub.   No murmur heard. Pulmonary/Chest: Effort normal and breath sounds normal. No respiratory distress. She has no wheezes. She has no rales. She exhibits no tenderness.  Abdominal: Soft. Bowel sounds are normal. She exhibits no distension. There is no tenderness.  Musculoskeletal: Normal range of motion. She exhibits no edema or tenderness.  Lymphadenopathy:    She has no cervical adenopathy.  Neurological: She is alert and oriented to person, place, and time. Coordination normal.  Skin: Skin is warm and dry. No rash noted. No erythema.  Psychiatric: She has a normal mood and affect. Her behavior is normal. Judgment and thought content normal.

## 2015-11-05 NOTE — Assessment & Plan Note (Signed)
Abnormal EKG with new right bundle branch block. New since 2014 She is asymptomatic, with right bundle, typically no indication for ischemia workup

## 2015-11-05 NOTE — Assessment & Plan Note (Signed)
She is scheduled for back surgery per Dr. Arnoldo Morale later this week

## 2015-11-05 NOTE — Assessment & Plan Note (Addendum)
We have encouraged continued exercise, careful diet management in an effort to lose weight.   Total encounter time more than 45 minutes  Greater than 50% was spent in counseling and coordination of care with the patient

## 2015-11-05 NOTE — Patient Instructions (Signed)
You are doing well. No medication changes were made.  We will send a note to Dr. Arnoldo Morale for surgery  Please call us if you have new issues that need to be addressed before your next appt.  Your physician wants you to follow-up in: 12 months.  You will receive a reminder letter in the mail two months in advance. If you don't receive a letter, please call our office to schedule the follow-up appointment.

## 2015-11-05 NOTE — Assessment & Plan Note (Signed)
She would be acceptable risk for upcoming back surgery, no further testing needed. Currently with no symptoms concerning for angina.

## 2015-11-05 NOTE — Assessment & Plan Note (Signed)
Blood pressure acceptable, a solid borderline elevated on today's visit, recommended she monitor her pressures at home. No medication changes made at this time

## 2015-11-07 ENCOUNTER — Encounter (HOSPITAL_COMMUNITY): Payer: Self-pay | Admitting: Certified Registered Nurse Anesthetist

## 2015-11-07 ENCOUNTER — Inpatient Hospital Stay (HOSPITAL_COMMUNITY)
Admission: RE | Admit: 2015-11-07 | Discharge: 2015-11-09 | DRG: 460 | Disposition: A | Discharge status: Skilled Nursing Facility | Payer: Commercial Managed Care - HMO | Source: Ambulatory Visit | Attending: Neurosurgery | Admitting: Neurosurgery

## 2015-11-07 ENCOUNTER — Inpatient Hospital Stay (HOSPITAL_COMMUNITY): Payer: Commercial Managed Care - HMO | Admitting: Certified Registered Nurse Anesthetist

## 2015-11-07 ENCOUNTER — Inpatient Hospital Stay (HOSPITAL_COMMUNITY): Payer: Commercial Managed Care - HMO | Admitting: Emergency Medicine

## 2015-11-07 ENCOUNTER — Encounter (HOSPITAL_COMMUNITY)
Admission: RE | Disposition: A | Discharge status: Skilled Nursing Facility | Payer: Self-pay | Source: Ambulatory Visit | Attending: Neurosurgery

## 2015-11-07 ENCOUNTER — Inpatient Hospital Stay (HOSPITAL_COMMUNITY): Payer: Commercial Managed Care - HMO

## 2015-11-07 DIAGNOSIS — M549 Dorsalgia, unspecified: Secondary | ICD-10-CM | POA: Diagnosis not present

## 2015-11-07 DIAGNOSIS — M4806 Spinal stenosis, lumbar region: Secondary | ICD-10-CM | POA: Diagnosis not present

## 2015-11-07 DIAGNOSIS — M4316 Spondylolisthesis, lumbar region: Secondary | ICD-10-CM | POA: Diagnosis not present

## 2015-11-07 DIAGNOSIS — Z7984 Long term (current) use of oral hypoglycemic drugs: Secondary | ICD-10-CM | POA: Diagnosis not present

## 2015-11-07 DIAGNOSIS — M5136 Other intervertebral disc degeneration, lumbar region: Secondary | ICD-10-CM | POA: Diagnosis not present

## 2015-11-07 DIAGNOSIS — E119 Type 2 diabetes mellitus without complications: Secondary | ICD-10-CM | POA: Diagnosis not present

## 2015-11-07 DIAGNOSIS — R2689 Other abnormalities of gait and mobility: Secondary | ICD-10-CM | POA: Diagnosis not present

## 2015-11-07 DIAGNOSIS — M48061 Spinal stenosis, lumbar region without neurogenic claudication: Secondary | ICD-10-CM | POA: Diagnosis present

## 2015-11-07 DIAGNOSIS — M5116 Intervertebral disc disorders with radiculopathy, lumbar region: Secondary | ICD-10-CM | POA: Diagnosis not present

## 2015-11-07 DIAGNOSIS — M419 Scoliosis, unspecified: Secondary | ICD-10-CM | POA: Diagnosis present

## 2015-11-07 DIAGNOSIS — I10 Essential (primary) hypertension: Secondary | ICD-10-CM | POA: Diagnosis present

## 2015-11-07 DIAGNOSIS — M4326 Fusion of spine, lumbar region: Secondary | ICD-10-CM | POA: Diagnosis not present

## 2015-11-07 DIAGNOSIS — Z981 Arthrodesis status: Secondary | ICD-10-CM | POA: Diagnosis present

## 2015-11-07 DIAGNOSIS — R278 Other lack of coordination: Secondary | ICD-10-CM | POA: Diagnosis not present

## 2015-11-07 DIAGNOSIS — Z419 Encounter for procedure for purposes other than remedying health state, unspecified: Secondary | ICD-10-CM

## 2015-11-07 DIAGNOSIS — M1990 Unspecified osteoarthritis, unspecified site: Secondary | ICD-10-CM | POA: Diagnosis not present

## 2015-11-07 DIAGNOSIS — M6281 Muscle weakness (generalized): Secondary | ICD-10-CM | POA: Diagnosis not present

## 2015-11-07 DIAGNOSIS — M48062 Spinal stenosis, lumbar region with neurogenic claudication: Secondary | ICD-10-CM | POA: Diagnosis present

## 2015-11-07 HISTORY — PX: TRANSFORAMINAL LUMBAR INTERBODY FUSION (TLIF) WITH PEDICLE SCREW FIXATION 2 LEVEL: SHX6142

## 2015-11-07 LAB — GLUCOSE, CAPILLARY
GLUCOSE-CAPILLARY: 107 mg/dL — AB (ref 65–99)
GLUCOSE-CAPILLARY: 196 mg/dL — AB (ref 65–99)

## 2015-11-07 SURGERY — TRANSFORAMINAL LUMBAR INTERBODY FUSION (TLIF) WITH PEDICLE SCREW FIXATION 2 LEVEL
Anesthesia: General | Site: Back

## 2015-11-07 MED ORDER — MIDAZOLAM HCL 5 MG/5ML IJ SOLN
INTRAMUSCULAR | Status: DC | PRN
  Administered 2015-11-07: 2 mg via INTRAVENOUS

## 2015-11-07 MED ORDER — PHENOL 1.4 % MT LIQD: 1.0000 | OROMUCOSAL | Status: DC | PRN

## 2015-11-07 MED ORDER — SODIUM CHLORIDE 0.9 % IV SOLN
INTRAVENOUS | Status: DC | PRN
  Administered 2015-11-07: 15:00:00 via INTRAVENOUS

## 2015-11-07 MED ORDER — NEOSTIGMINE METHYLSULFATE 10 MG/10ML IV SOLN
INTRAVENOUS | Status: DC | PRN
  Administered 2015-11-07: 3 mg via INTRAVENOUS

## 2015-11-07 MED ORDER — BUPIVACAINE LIPOSOME 1.3 % IJ SUSP
INTRAMUSCULAR | Status: DC | PRN
  Administered 2015-11-07: 20 mL

## 2015-11-07 MED ORDER — ACETAMINOPHEN 650 MG RE SUPP: 650.0000 mg | RECTAL | Status: DC | PRN

## 2015-11-07 MED ORDER — GABAPENTIN 400 MG PO CAPS
800.0000 mg | ORAL_CAPSULE | Freq: Four times a day (QID) | ORAL | Status: DC
  Administered 2015-11-07 – 2015-11-08 (×3): 800 mg via ORAL
  Filled 2015-11-07 (×3): qty 2

## 2015-11-07 MED ORDER — HYDROCODONE-ACETAMINOPHEN 5-325 MG PO TABS: 1.0000 | ORAL_TABLET | ORAL | Status: DC | PRN

## 2015-11-07 MED ORDER — INSULIN ASPART 100 UNIT/ML ~~LOC~~ SOLN
0.0000 [IU] | Freq: Three times a day (TID) | SUBCUTANEOUS | Status: DC
  Administered 2015-11-08: 3 [IU] via SUBCUTANEOUS
  Administered 2015-11-08: 4 [IU] via SUBCUTANEOUS

## 2015-11-07 MED ORDER — ONDANSETRON HCL 4 MG/2ML IJ SOLN: 4.0000 mg | Freq: Four times a day (QID) | INTRAMUSCULAR | Status: DC | PRN

## 2015-11-07 MED ORDER — ONDANSETRON HCL 4 MG/2ML IJ SOLN
INTRAMUSCULAR | Status: DC | PRN
  Administered 2015-11-07: 4 mg via INTRAVENOUS

## 2015-11-07 MED ORDER — ALUM & MAG HYDROXIDE-SIMETH 200-200-20 MG/5ML PO SUSP: 30.0000 mL | Freq: Four times a day (QID) | ORAL | Status: DC | PRN

## 2015-11-07 MED ORDER — LACTATED RINGERS IV SOLN
INTRAVENOUS | Status: DC
  Administered 2015-11-07 (×4): via INTRAVENOUS

## 2015-11-07 MED ORDER — FENTANYL CITRATE (PF) 100 MCG/2ML IJ SOLN
INTRAMUSCULAR | Status: DC | PRN
  Administered 2015-11-07 (×3): 50 ug via INTRAVENOUS
  Administered 2015-11-07 (×2): 100 ug via INTRAVENOUS
  Administered 2015-11-07 (×5): 50 ug via INTRAVENOUS
  Administered 2015-11-07: 100 ug via INTRAVENOUS

## 2015-11-07 MED ORDER — CEFAZOLIN SODIUM-DEXTROSE 2-3 GM-% IV SOLR
2.0000 g | Freq: Three times a day (TID) | INTRAVENOUS | Status: AC
  Administered 2015-11-07 – 2015-11-08 (×2): 2 g via INTRAVENOUS
  Filled 2015-11-07 (×2): qty 50

## 2015-11-07 MED ORDER — EPHEDRINE SULFATE 50 MG/ML IJ SOLN
INTRAMUSCULAR | Status: DC | PRN
  Administered 2015-11-07: 5 mg via INTRAVENOUS
  Administered 2015-11-07: 10 mg via INTRAVENOUS

## 2015-11-07 MED ORDER — DIAZEPAM 5 MG PO TABS
5.0000 mg | ORAL_TABLET | Freq: Four times a day (QID) | ORAL | Status: DC | PRN
  Administered 2015-11-08 – 2015-11-09 (×4): 5 mg via ORAL
  Filled 2015-11-07 (×4): qty 1

## 2015-11-07 MED ORDER — OXYCODONE HCL 5 MG/5ML PO SOLN: 5.0000 mg | Freq: Once | ORAL | Status: DC | PRN

## 2015-11-07 MED ORDER — DOCUSATE SODIUM 100 MG PO CAPS
100.0000 mg | ORAL_CAPSULE | Freq: Two times a day (BID) | ORAL | Status: DC
  Administered 2015-11-07 – 2015-11-09 (×4): 100 mg via ORAL
  Filled 2015-11-07 (×4): qty 1

## 2015-11-07 MED ORDER — NEOSTIGMINE METHYLSULFATE 10 MG/10ML IV SOLN
INTRAVENOUS | Status: AC
  Filled 2015-11-07: qty 1

## 2015-11-07 MED ORDER — MIDAZOLAM HCL 2 MG/2ML IJ SOLN
INTRAMUSCULAR | Status: AC
  Filled 2015-11-07: qty 2

## 2015-11-07 MED ORDER — OXYCODONE HCL 5 MG PO TABS: 5.0000 mg | ORAL_TABLET | Freq: Once | ORAL | Status: DC | PRN

## 2015-11-07 MED ORDER — OXYCODONE-ACETAMINOPHEN 5-325 MG PO TABS
1.0000 | ORAL_TABLET | ORAL | Status: DC | PRN
  Administered 2015-11-07 – 2015-11-09 (×10): 2 via ORAL
  Filled 2015-11-07 (×10): qty 2

## 2015-11-07 MED ORDER — LACTATED RINGERS IV SOLN: INTRAVENOUS | Status: DC

## 2015-11-07 MED ORDER — GABAPENTIN 800 MG PO TABS
800.0000 mg | ORAL_TABLET | Freq: Four times a day (QID) | ORAL | Status: DC
  Filled 2015-11-07 (×2): qty 1

## 2015-11-07 MED ORDER — FENTANYL CITRATE (PF) 250 MCG/5ML IJ SOLN
INTRAMUSCULAR | Status: AC
  Filled 2015-11-07: qty 5

## 2015-11-07 MED ORDER — BUPIVACAINE LIPOSOME 1.3 % IJ SUSP
20.0000 mL | Freq: Once | INTRAMUSCULAR | Status: DC
  Filled 2015-11-07: qty 20

## 2015-11-07 MED ORDER — VANCOMYCIN HCL 1000 MG IV SOLR
INTRAVENOUS | Status: DC | PRN
  Administered 2015-11-07: 1000 mg via TOPICAL

## 2015-11-07 MED ORDER — HYDROCHLOROTHIAZIDE 25 MG PO TABS
25.0000 mg | ORAL_TABLET | Freq: Every day | ORAL | Status: DC
  Administered 2015-11-08 – 2015-11-09 (×2): 25 mg via ORAL
  Filled 2015-11-07 (×2): qty 1

## 2015-11-07 MED ORDER — ROCURONIUM BROMIDE 100 MG/10ML IV SOLN
INTRAVENOUS | Status: DC | PRN
  Administered 2015-11-07 (×3): 10 mg via INTRAVENOUS
  Administered 2015-11-07: 50 mg via INTRAVENOUS
  Administered 2015-11-07: 20 mg via INTRAVENOUS

## 2015-11-07 MED ORDER — DEXAMETHASONE SODIUM PHOSPHATE 4 MG/ML IJ SOLN
INTRAMUSCULAR | Status: DC | PRN
  Administered 2015-11-07: 8 mg via INTRAVENOUS

## 2015-11-07 MED ORDER — CEFAZOLIN SODIUM-DEXTROSE 2-3 GM-% IV SOLR
INTRAVENOUS | Status: AC
  Filled 2015-11-07: qty 50

## 2015-11-07 MED ORDER — SODIUM CHLORIDE 0.9 % IR SOLN
Status: DC | PRN
  Administered 2015-11-07: 12:00:00

## 2015-11-07 MED ORDER — ONDANSETRON HCL 4 MG/2ML IJ SOLN: 4.0000 mg | INTRAMUSCULAR | Status: DC | PRN

## 2015-11-07 MED ORDER — MENTHOL 3 MG MT LOZG: 1.0000 | LOZENGE | OROMUCOSAL | Status: DC | PRN

## 2015-11-07 MED ORDER — THROMBIN 20000 UNITS EX SOLR
CUTANEOUS | Status: DC | PRN
  Administered 2015-11-07: 12:00:00 via TOPICAL

## 2015-11-07 MED ORDER — ALBUMIN HUMAN 5 % IV SOLN
INTRAVENOUS | Status: DC | PRN
  Administered 2015-11-07: 14:00:00 via INTRAVENOUS

## 2015-11-07 MED ORDER — MORPHINE SULFATE (PF) 2 MG/ML IV SOLN
1.0000 mg | INTRAVENOUS | Status: DC | PRN
  Administered 2015-11-07: 2 mg via INTRAVENOUS
  Filled 2015-11-07: qty 1

## 2015-11-07 MED ORDER — PHENYLEPHRINE HCL 10 MG/ML IJ SOLN
INTRAMUSCULAR | Status: DC | PRN
  Administered 2015-11-07 (×3): 80 ug via INTRAVENOUS

## 2015-11-07 MED ORDER — BUPIVACAINE-EPINEPHRINE (PF) 0.5% -1:200000 IJ SOLN
INTRAMUSCULAR | Status: DC | PRN
  Administered 2015-11-07: 10 mL via PERINEURAL

## 2015-11-07 MED ORDER — 0.9 % SODIUM CHLORIDE (POUR BTL) OPTIME
TOPICAL | Status: DC | PRN
  Administered 2015-11-07: 1000 mL

## 2015-11-07 MED ORDER — HYDROMORPHONE HCL 1 MG/ML IJ SOLN
INTRAMUSCULAR | Status: AC
  Administered 2015-11-07: 0.5 mg
  Filled 2015-11-07: qty 1

## 2015-11-07 MED ORDER — PROPOFOL 10 MG/ML IV BOLUS
INTRAVENOUS | Status: AC
  Filled 2015-11-07: qty 20

## 2015-11-07 MED ORDER — LIDOCAINE HCL (CARDIAC) 20 MG/ML IV SOLN
INTRAVENOUS | Status: DC | PRN
  Administered 2015-11-07: 60 mg via INTRAVENOUS

## 2015-11-07 MED ORDER — GLYCOPYRROLATE 0.2 MG/ML IJ SOLN
INTRAMUSCULAR | Status: DC | PRN
  Administered 2015-11-07: 0.4 mg via INTRAVENOUS

## 2015-11-07 MED ORDER — LISINOPRIL-HYDROCHLOROTHIAZIDE 20-25 MG PO TABS: 1.0000 | ORAL_TABLET | Freq: Every day | ORAL | Status: DC

## 2015-11-07 MED ORDER — INSULIN ASPART 100 UNIT/ML ~~LOC~~ SOLN: 0.0000 [IU] | SUBCUTANEOUS | Status: DC

## 2015-11-07 MED ORDER — BACITRACIN ZINC 500 UNIT/GM EX OINT
TOPICAL_OINTMENT | CUTANEOUS | Status: DC | PRN
  Administered 2015-11-07: 1 via TOPICAL

## 2015-11-07 MED ORDER — PROPOFOL 10 MG/ML IV BOLUS
INTRAVENOUS | Status: DC | PRN
  Administered 2015-11-07: 150 mg via INTRAVENOUS

## 2015-11-07 MED ORDER — INSULIN ASPART 100 UNIT/ML ~~LOC~~ SOLN: 0.0000 [IU] | Freq: Every day | SUBCUTANEOUS | Status: DC

## 2015-11-07 MED ORDER — METFORMIN HCL ER 500 MG PO TB24: 500.0000 mg | ORAL_TABLET | Freq: Every day | ORAL | Status: DC

## 2015-11-07 MED ORDER — CEFAZOLIN SODIUM-DEXTROSE 2-3 GM-% IV SOLR
2.0000 g | INTRAVENOUS | Status: AC
  Administered 2015-11-07 (×2): 2 g via INTRAVENOUS

## 2015-11-07 MED ORDER — LISINOPRIL 20 MG PO TABS
20.0000 mg | ORAL_TABLET | Freq: Every day | ORAL | Status: DC
  Administered 2015-11-08 – 2015-11-09 (×2): 20 mg via ORAL
  Filled 2015-11-07 (×2): qty 1

## 2015-11-07 MED ORDER — GLYCOPYRROLATE 0.2 MG/ML IJ SOLN
INTRAMUSCULAR | Status: AC
  Filled 2015-11-07: qty 2

## 2015-11-07 MED ORDER — BISACODYL 10 MG RE SUPP: 10.0000 mg | Freq: Every day | RECTAL | Status: DC | PRN

## 2015-11-07 MED ORDER — ACETAMINOPHEN 325 MG PO TABS: 650.0000 mg | ORAL_TABLET | ORAL | Status: DC | PRN

## 2015-11-07 MED ORDER — HYDROMORPHONE HCL 1 MG/ML IJ SOLN
0.2500 mg | INTRAMUSCULAR | Status: DC | PRN
  Administered 2015-11-07: 0.5 mg via INTRAVENOUS

## 2015-11-07 MED ORDER — VANCOMYCIN HCL 1000 MG IV SOLR
INTRAVENOUS | Status: AC
Start: 2015-11-07 — End: 2015-11-07
  Filled 2015-11-07: qty 1000

## 2015-11-07 SURGICAL SUPPLY — 63 items
BAG DECANTER FOR FLEXI CONT (MISCELLANEOUS) ×3 IMPLANT
BENZOIN TINCTURE PRP APPL 2/3 (GAUZE/BANDAGES/DRESSINGS) ×3 IMPLANT
BLADE CLIPPER SURG (BLADE) IMPLANT
BRUSH SCRUB EZ PLAIN DRY (MISCELLANEOUS) ×3 IMPLANT
BUR MATCHSTICK NEURO 3.0 LAGG (BURR) ×3 IMPLANT
BUR PRECISION FLUTE 6.0 (BURR) ×3 IMPLANT
CANISTER SUCT 3000ML PPV (MISCELLANEOUS) ×3 IMPLANT
CAP REVERE LOCKING (Cap) ×18 IMPLANT
CONT SPEC 4OZ CLIKSEAL STRL BL (MISCELLANEOUS) ×3 IMPLANT
COVER BACK TABLE 60X90IN (DRAPES) ×3 IMPLANT
DECANTER SPIKE VIAL GLASS SM (MISCELLANEOUS) ×3 IMPLANT
DRAPE C-ARM 42X72 X-RAY (DRAPES) ×6 IMPLANT
DRAPE LAPAROTOMY 100X72X124 (DRAPES) ×3 IMPLANT
DRAPE POUCH INSTRU U-SHP 10X18 (DRAPES) ×3 IMPLANT
DRAPE PROXIMA HALF (DRAPES) ×3 IMPLANT
DRAPE SURG 17X23 STRL (DRAPES) ×12 IMPLANT
ELECT BLADE 4.0 EZ CLEAN MEGAD (MISCELLANEOUS) ×3
ELECT REM PT RETURN 9FT ADLT (ELECTROSURGICAL) ×3
ELECTRODE BLDE 4.0 EZ CLN MEGD (MISCELLANEOUS) ×2 IMPLANT
ELECTRODE REM PT RTRN 9FT ADLT (ELECTROSURGICAL) ×2 IMPLANT
GAUZE SPONGE 4X4 12PLY STRL (GAUZE/BANDAGES/DRESSINGS) ×3 IMPLANT
GAUZE SPONGE 4X4 16PLY XRAY LF (GAUZE/BANDAGES/DRESSINGS) ×3 IMPLANT
GLOVE BIO SURGEON STRL SZ8 (GLOVE) ×9 IMPLANT
GLOVE BIO SURGEON STRL SZ8.5 (GLOVE) ×6 IMPLANT
GLOVE EXAM NITRILE LRG STRL (GLOVE) IMPLANT
GLOVE EXAM NITRILE MD LF STRL (GLOVE) IMPLANT
GLOVE EXAM NITRILE XL STR (GLOVE) IMPLANT
GLOVE EXAM NITRILE XS STR PU (GLOVE) IMPLANT
GLOVE INDICATOR 7.0 STRL GRN (GLOVE) ×6 IMPLANT
GLOVE INDICATOR 7.5 STRL GRN (GLOVE) ×3 IMPLANT
GLOVE SURG SS PI 6.5 STRL IVOR (GLOVE) ×9 IMPLANT
GLOVE SURG SS PI 7.0 STRL IVOR (GLOVE) ×6 IMPLANT
GOWN STRL REUS W/ TWL LRG LVL3 (GOWN DISPOSABLE) IMPLANT
GOWN STRL REUS W/ TWL XL LVL3 (GOWN DISPOSABLE) ×8 IMPLANT
GOWN STRL REUS W/TWL 2XL LVL3 (GOWN DISPOSABLE) IMPLANT
GOWN STRL REUS W/TWL LRG LVL3 (GOWN DISPOSABLE)
GOWN STRL REUS W/TWL XL LVL3 (GOWN DISPOSABLE) ×4
KIT BASIN OR (CUSTOM PROCEDURE TRAY) ×3 IMPLANT
KIT ROOM TURNOVER OR (KITS) ×3 IMPLANT
NEEDLE HYPO 21X1.5 SAFETY (NEEDLE) IMPLANT
NEEDLE HYPO 22GX1.5 SAFETY (NEEDLE) ×3 IMPLANT
NS IRRIG 1000ML POUR BTL (IV SOLUTION) ×3 IMPLANT
PACK LAMINECTOMY NEURO (CUSTOM PROCEDURE TRAY) ×3 IMPLANT
PAD ARMBOARD 7.5X6 YLW CONV (MISCELLANEOUS) ×9 IMPLANT
PATTIES SURGICAL .5 X1 (DISPOSABLE) IMPLANT
ROD REVERE CURVED 65MM (Rod) ×6 IMPLANT
SCREW REVERE 6.35 6.5MMX45 (Screw) ×18 IMPLANT
SPACER ALTERA 10X31 8-12MM-8 (Spacer) ×6 IMPLANT
SPONGE LAP 4X18 X RAY DECT (DISPOSABLE) IMPLANT
SPONGE NEURO XRAY DETECT 1X3 (DISPOSABLE) IMPLANT
SPONGE SURGIFOAM ABS GEL 100 (HEMOSTASIS) ×3 IMPLANT
STRIP BIOACTIVE 10CC 25X50X8 (Miscellaneous) ×6 IMPLANT
STRIP BIOACTIVE 20CC 25X100X8 (Miscellaneous) ×3 IMPLANT
STRIP CLOSURE SKIN 1/2X4 (GAUZE/BANDAGES/DRESSINGS) ×3 IMPLANT
SUT VIC AB 1 CT1 18XBRD ANBCTR (SUTURE) ×4 IMPLANT
SUT VIC AB 1 CT1 8-18 (SUTURE) ×2
SUT VIC AB 2-0 CP2 18 (SUTURE) ×6 IMPLANT
SYR 20CC LL (SYRINGE) ×3 IMPLANT
TAPE CLOTH SURG 4X10 WHT LF (GAUZE/BANDAGES/DRESSINGS) ×3 IMPLANT
TOWEL OR 17X24 6PK STRL BLUE (TOWEL DISPOSABLE) ×3 IMPLANT
TOWEL OR 17X26 10 PK STRL BLUE (TOWEL DISPOSABLE) ×3 IMPLANT
TRAY FOLEY W/METER SILVER 14FR (SET/KITS/TRAYS/PACK) ×3 IMPLANT
WATER STERILE IRR 1000ML POUR (IV SOLUTION) ×3 IMPLANT

## 2015-11-07 NOTE — Anesthesia Procedure Notes (Signed)
Procedure Name: Intubation Date/Time: 11/07/2015 11:30 AM Performed by: Bethel Born Pre-anesthesia Checklist: Patient identified, Timeout performed, Emergency Drugs available, Suction available and Patient being monitored Patient Re-evaluated:Patient Re-evaluated prior to inductionOxygen Delivery Method: Circle system utilized Preoxygenation: Pre-oxygenation with 100% oxygen Intubation Type: IV induction Ventilation: Mask ventilation without difficulty Grade View: Grade I Tube type: Oral Tube size: 7.0 mm Airway Equipment and Method: Stylet Placement Confirmation: ETT inserted through vocal cords under direct vision,  breath sounds checked- equal and bilateral and positive ETCO2 Secured at: 23 cm Tube secured with: Tape Dental Injury: Teeth and Oropharynx as per pre-operative assessment

## 2015-11-07 NOTE — Anesthesia Preprocedure Evaluation (Signed)
Anesthesia Evaluation  Patient identified by MRN, date of birth, ID band Patient awake    Reviewed: Allergy & Precautions, NPO status , Patient's Chart, lab work & pertinent test results  Airway Mallampati: II   Neck ROM: full    Dental   Pulmonary neg pulmonary ROS,    breath sounds clear to auscultation       Cardiovascular hypertension,  Rhythm:regular Rate:Normal     Neuro/Psych  Neuromuscular disease    GI/Hepatic IBS   Endo/Other  diabetes, Type 2obese  Renal/GU      Musculoskeletal  (+) Arthritis ,   Abdominal   Peds  Hematology   Anesthesia Other Findings   Reproductive/Obstetrics                             Anesthesia Physical Anesthesia Plan  ASA: II  Anesthesia Plan: General   Post-op Pain Management:    Induction: Intravenous  Airway Management Planned: Oral ETT  Additional Equipment:   Intra-op Plan:   Post-operative Plan: Extubation in OR  Informed Consent: I have reviewed the patients History and Physical, chart, labs and discussed the procedure including the risks, benefits and alternatives for the proposed anesthesia with the patient or authorized representative who has indicated his/her understanding and acceptance.     Plan Discussed with: CRNA, Surgeon and Anesthesiologist  Anesthesia Plan Comments:         Anesthesia Quick Evaluation

## 2015-11-07 NOTE — H&P (Signed)
Subjective: The patient is a 70 year old white female who has complained of back and leg pain consistent with neurogenic claudication. She has failed medical management and was worked up with a lumbar MRI and lumbar x-rays. This demonstrated a thoracolumbar scoliosis, spondylolisthesis, spinal stenosis, etc. I discussed the various treatment options with the patient including surgery. She has decided to proceed with an L3-4 and L4-5 decompression, instrumentation, and fusion.   Past Medical History  Diagnosis Date  . Hypertension   . Arthritis   . Lumbar stenosis   . Diabetes mellitus without complication ([place])   . Nocturia   . Confusion   . Wears glasses   . Memory loss   . Numbness and tingling     legs and feet  . Hepatitis     Past Surgical History  Procedure Laterality Date  . Cesarean section  1978  . Hemorrhoid surgery  2015  . Colonoscopy      No Known Allergies  Social History  Substance Use Topics  . Smoking status: Never Smoker   . Smokeless tobacco: Never Used  . Alcohol Use: No     Comment: rarely    Family History  Problem Relation Age of Onset  . Hypertension Other   . Cancer Other     ovarian  . Cancer Mother   . Heart disease Father    Prior to Admission medications   Medication Sig Start Date End Date Taking? Authorizing Provider  Ascorbic Acid (VITAMIN C WITH ROSE HIPS) 1000 MG tablet Take 1,000 mg by mouth daily.   Yes Historical Provider, MD  Blood Glucose Monitoring Suppl (ACCU-CHEK AVIVA PLUS) w/Device KIT Use to test blood sugar once daily Dx: R73.03 10/03/15  Yes Venia Carbon, MD  CALCIUM-MAGNESIUM PO Take 2 tablets by mouth daily.   Yes Historical Provider, MD  gabapentin (NEURONTIN) 800 MG tablet Take 800 mg by mouth 4 (four) times daily.  10/12/15  Yes Historical Provider, MD  glucose blood (ACCU-CHEK AVIVA PLUS) test strip Use to test blood sugar once daily Dx: R73.03 10/03/15  Yes Venia Carbon, MD  lisinopril-hydrochlorothiazide  (PRINZIDE,ZESTORETIC) 20-25 MG tablet TAKE 2 TABLETS BY MOUTH DIALY 09/04/15  Yes Venia Carbon, MD  metFORMIN (GLUCOPHAGE XR) 500 MG 24 hr tablet Take 1 tablet (500 mg total) by mouth daily with breakfast. 09/18/15  Yes Venia Carbon, MD  Multiple Vitamins-Minerals (MULTIVITAMIN WITH MINERALS) tablet Take 1 tablet by mouth daily.   Yes Historical Provider, MD  NON FORMULARY Calcium 250 mg+ Magnesium 250 mg + Potassium 45 mg Takes 2 tablets daily.   Yes Historical Provider, MD  Omega-3 Fatty Acids (FISH OIL) 1000 MG CAPS Take 1 capsule by mouth daily.   Yes Historical Provider, MD  OVER THE COUNTER MEDICATION Take 2 tablets by mouth daily. Focus Formula   Yes Historical Provider, MD  Soya Lecithin 1200 MG CAPS Take 1 tablet by mouth daily.    Yes Historical Provider, MD  BAYER MICROLET LANCETS lancets Use as instructed to test blood sugar once daily dx: R73.03 10/31/15   Venia Carbon, MD  ibuprofen (ADVIL,MOTRIN) 200 MG tablet Take 200-400 mg by mouth every 8 (eight) hours as needed for mild pain or moderate pain. As needed for back pain    Historical Provider, MD     Review of Systems  Positive ROS: As above  All other systems have been reviewed and were otherwise negative with the exception of those mentioned in the HPI and as above.  Objective: Vital signs in last 24 hours: Temp:  [97.6 F (36.4 C)] 97.6 F (36.4 C) (03/01 0942) Pulse Rate:  [65] 65 (03/01 0942) BP: (127)/(58) 127/58 mmHg (03/01 0942) SpO2:  [93 %] 93 % (03/01 0942) Weight:  [94.802 kg (209 lb)] 94.802 kg (209 lb) (03/01 0942)  General Appearance: Alert, cooperative, no distress, Head: Normocephalic, without obvious abnormality, atraumatic Eyes: PERRL, conjunctiva/corneas clear, EOM's intact,    Ears: Normal  Throat: Normal  Neck: Supple, symmetrical, trachea midline, no adenopathy; thyroid: No enlargement/tenderness/nodules; no carotid bruit or JVD Back: Symmetric, no curvature, ROM normal, no CVA  tenderness Lungs: Clear to auscultation bilaterally, respirations unlabored Heart: Regular rate and rhythm, no murmur, rub or gallop Abdomen: Soft, non-tender,, no masses, no organomegaly Extremities: Extremities normal, atraumatic, no cyanosis or edema Pulses: 2+ and symmetric all extremities Skin: Skin color, texture, turgor normal, no rashes or lesions  NEUROLOGIC:   Mental status: alert and oriented, no aphasia, good attention span, Fund of knowledge/ memory ok Motor Exam - grossly normal Sensory Exam - grossly normal Reflexes:  Coordination - grossly normal Gait - grossly normal Balance - grossly normal Cranial Nerves: I: smell Not tested  II: visual acuity  OS: Normal  OD: Normal   II: visual fields Full to confrontation  II: pupils Equal, round, reactive to light  III,VII: ptosis None  III,IV,VI: extraocular muscles  Full ROM  V: mastication Normal  V: facial light touch sensation  Normal  V,VII: corneal reflex  Present  VII: facial muscle function - upper  Normal  VII: facial muscle function - lower Normal  VIII: hearing Not tested  IX: soft palate elevation  Normal  IX,X: gag reflex Present  XI: trapezius strength  5/5  XI: sternocleidomastoid strength 5/5  XI: neck flexion strength  5/5  XII: tongue strength  Normal    Data Review Lab Results  Component Value Date   WBC 10.3 10/30/2015   HGB 15.2* 10/30/2015   HCT 47.0* 10/30/2015   MCV 98.3 10/30/2015   PLT 242 10/30/2015   Lab Results  Component Value Date   NA 141 10/30/2015   K 4.5 10/30/2015   CL 101 10/30/2015   CO2 28 10/30/2015   BUN 23* 10/30/2015   CREATININE 0.98 10/30/2015   GLUCOSE 95 10/30/2015   No results found for: INR, PROTIME  Assessment/Plan: L3-4 and L4-5 spondylolisthesis, spinal stenosis, lumbago, lumbar radiculopathy, neurogenic claudication, thoracolumbar scoliosis: I have discussed the situation with the patient and reviewed her imaging studies with her. We have discussed  the various treatment options including surgery. I described the surgical treatment option L3-4 and L4-5 decompression, instrumentation, and fusion. I have shown her surgical models. We have discussed the risks, benefits, alternatives, and likelihood of achieving our goals with surgery. I have answered all the patient's questions. She has decided to proceed with surgery.   Khasir Woodrome D 11/07/2015 10:52 AM

## 2015-11-07 NOTE — Progress Notes (Signed)
Patient ID: [name], female   DOB: 02-05-1946, 70 y.o.   MRN: [id number] Subjective:  The patient is somnolent but easily arousable. She is in no apparent distress. She looks well.  Objective: Vital signs in last 24 hours: Temp:  [97.6 F (36.4 C)] 97.6 F (36.4 C) (03/01 0942) Pulse Rate:  [65] 65 (03/01 0942) BP: (127)/(58) 127/58 mmHg (03/01 0942) SpO2:  [93 %] 93 % (03/01 0942) Weight:  [94.802 kg (209 lb)] 94.802 kg (209 lb) (03/01 0942)  Intake/Output from previous day:   Intake/Output this shift: Total I/O In: 2250 [I.V.:2000; IV Piggyback:250] Out: 1595 [Urine:1095; Blood:500]  Physical exam the patient is somnolent but arousable. She is moving her lower extremities well.  Lab Results: No results for input(s): WBC, HGB, HCT, PLT in the last 72 hours. BMET No results for input(s): NA, K, CL, CO2, GLUCOSE, BUN, CREATININE, CALCIUM in the last 72 hours.  Studies/Results: Dg Lumbar Spine 2-3 Views  11/07/2015  CLINICAL DATA:  Lumbar fusion L3-L4 and L4-L5 level EXAM: LUMBAR SPINE - 2-3 VIEW; DG C-ARM 61-120 MIN COMPARISON:  Intraoperative localization film same day FINDINGS: Three views of the lumbar spine submitted. Posterior transpedicular metallic screws are noted at L3, L4 and L5 level. The alignment is preserved. Persistent mild anterolisthesis L4 on L5. Postsurgical disc spacer material noted at L3-L4 and L4-L5 level. IMPRESSION: Posterior transpedicular metallic screws are noted at L3, L4 and L5 level. The alignment is preserved. Persistent mild anterolisthesis L4 on L5. Postsurgical disc spacer material noted at L3-L4 and L4-L5 level. Please see the operative report. Fluoroscopy time 43 seconds. Electronically Signed   By: Lahoma Crocker M.D.   On: 11/07/2015 16:27   Dg Lumbar Spine 1 View  11/07/2015  CLINICAL DATA:  Intraoperative localization film patient for L3-4 and L4-5 fusion. EXAM: LUMBAR SPINE - 1 VIEW COMPARISON:  MRI lumbar spine 05/21/2015. FINDINGS: A single  intraoperative view of the lumbar spine in the lateral projection is provided. Image demonstrates a probe at the L5-S1 level. IMPRESSION: As above. Electronically Signed   By: Inge Rise M.D.   On: 11/07/2015 14:37   Dg C-arm 61-120 Min  11/07/2015  CLINICAL DATA:  Lumbar fusion L3-L4 and L4-L5 level EXAM: LUMBAR SPINE - 2-3 VIEW; DG C-ARM 61-120 MIN COMPARISON:  Intraoperative localization film same day FINDINGS: Three views of the lumbar spine submitted. Posterior transpedicular metallic screws are noted at L3, L4 and L5 level. The alignment is preserved. Persistent mild anterolisthesis L4 on L5. Postsurgical disc spacer material noted at L3-L4 and L4-L5 level. IMPRESSION: Posterior transpedicular metallic screws are noted at L3, L4 and L5 level. The alignment is preserved. Persistent mild anterolisthesis L4 on L5. Postsurgical disc spacer material noted at L3-L4 and L4-L5 level. Please see the operative report. Fluoroscopy time 43 seconds. Electronically Signed   By: Lahoma Crocker M.D.   On: 11/07/2015 16:27    Assessment/Plan: The patient is doing well. I left a message for her son as requested by the patient.  LOS: 0 days     [name] D 11/07/2015, 4:45 PM

## 2015-11-07 NOTE — Transfer of Care (Signed)
Immediate Anesthesia Transfer of Care Note  Patient: [name]  Procedure(s) Performed: Procedure(s): Lumbar three-four Lumbar four-five  transforaminal lumbar interbody fusion with interbody prosthesis posterior lateral arthrodesis and posterior segmental instrumentation (N/A)  Patient Location: PACU  Anesthesia Type:General  Level of Consciousness: awake, alert , oriented and patient cooperative  Airway & Oxygen Therapy: Patient Spontanous Breathing and Patient connected to nasal cannula oxygen  Post-op Assessment: Report given to RN, Post -op Vital signs reviewed and stable and Patient moving all extremities  Post vital signs: Reviewed and stable  Last Vitals:  Filed Vitals:   11/07/15 0942  BP: 127/58  Pulse: 65  Temp: [id number] C    Complications: No apparent anesthesia complications

## 2015-11-07 NOTE — Op Note (Signed)
Brief history: Patient is a 70 year old white female who has complained of back and leg pain consistent with neurogenic claudication. She has failed medical management and was worked up with a lumbar MRI and x-rays. This demonstrated the patient had a thoracic lumbar scoliosis with an L3-4 and L4-5 spondylolisthesis and spinal stenosis. I discussed the situation with the patient and her son and reviewed the imaging studies with him. We discussed the various treatment options including surgery. The patient has weighed the risks, benefits, and alternative surgery and decided proceed with an L3-4 and L4-5 decompression, instrumentation, and fusion.  Preoperative diagnosis: L3-4 and L4-5 spinal stenosis, spondylolisthesis, Degenerative disc disease, spinal stenosis compressing L3, L4 and L5 nerve roots; lumbago; lumbar radiculopathy  Postoperative diagnosis: The same  Procedure: L4 laminectomy with bilateral L3 Laminotomy/foraminotomies to decompress the bilateral L3, L4 and L5 nerve roots(the work required to do this was in addition to the work required to do the posterior lumbar interbody fusion because of the patient's spinal stenosis, facet arthropathy. Etc. requiring a wide decompression of the nerve roots.); L3-4 and L4-5 transforaminal lumbar interbody fusion with local morselized autograft bone and Kinnex graft extender; insertion of interbody prosthesis at L3-4 and L4-5 (globus peek expandable interbody prosthesis); posterior segmental instrumentation from L3 to L5 with globus titanium pedicle screws and rods; posterior lateral arthrodesis at L3-4 and L4-5 with local morselized autograft bone and Kinnex bone graft extender.  Surgeon: Dr. Earle Gell  Asst.: Dr. Francesca Jewett  Anesthesia: Gen. endotracheal  Estimated blood loss: 400 mL  Drains: None  Complications: None  Description of procedure: The patient was brought to the operating room by the anesthesia team. General endotracheal  anesthesia was induced. The patient was turned to the prone position on the Wilson frame. The patient's lumbosacral region was then prepared with Betadine scrub and Betadine solution. Sterile drapes were applied.  I then injected the area to be incised with Marcaine with epinephrine solution. I then used the scalpel to make a linear midline incision over the L3-4 and L4-5 interspace. I then used electrocautery to perform a bilateral subperiosteal dissection exposing the spinous process and lamina of L3, L4 and L5. We then obtained intraoperative radiograph to confirm our location. We then inserted the Verstrac retractor to provide exposure.  I began the decompression by using the high speed drill to perform laminotomies at L3-4 and L4-5 bilaterally. We then used the Kerrison punches to complete the L4 laminectomy and to widen the bilateral laminotomies at L3 and removed the ligamentum flavum at L3-4 and L4-5. We used the Kerrison punches to remove the medial facets at L3-4 and L4-5. We performed wide foraminotomies about the bilateral L3, L4 and L5 nerve roots completing the decompression.  We now turned our attention to the transforaminal lumbar interbody fusion. I used a scalpel to incise the intervertebral disc at L3-4 and L4-5 on the left. I then performed a partial intervertebral discectomy at L3-4 and L4-5 using the pituitary forceps. We prepared the vertebral endplates at [id number] and [id number] for the fusion by removing the soft tissues with the curettes. We then used the trial spacers to pick the appropriate sized interbody prosthesis. We prefilled his prosthesis with a combination of local morselized autograft bone that we obtained during the decompression as well as Kinnex bone graft extender. We inserted the prefilled prosthesis into the interspace at L3-4 and L4-5 from the left, we then expanded the prosthesis. There was a good snug fit of the prosthesis in the interspace.  We then filled and the remainder  of the intervertebral disc space with local morselized autograft bone and Kinnex. This completed the posterior lumbar interbody arthrodesis.  We now turned attention to the instrumentation. Under fluoroscopic guidance we cannulated the bilateral L3, L4 and L5 pedicles with the bone probe. We then removed the bone probe. We then tapped the pedicle with a 5.5 millimeter tap. We then removed the tap. We probed inside the tapped pedicle with a ball probe to rule out cortical breaches. We then inserted a 6.5 x 45 millimeter pedicle screw into the L3, L4 and L5 pedicles bilaterally under fluoroscopic guidance. We then palpated along the medial aspect of the pedicles to rule out cortical breaches. There were none. The nerve roots were not injured. We then connected the unilateral pedicle screws with a lordotic rod. We partially reduce the scoliosis. We compressed the construct and secured the rod in place with the caps. We then tightened the caps appropriately. This completed the instrumentation from L3-L5 bilaterally.  We now turned our attention to the posterior lateral arthrodesis at L3-4 and L4-5. We used the high-speed drill to decorticate the remainder of the facets, pars, transverse process at L3-4 and L4-5. We then applied a combination of local morselized autograft bone and Kinnex bone graft extender over these decorticated posterior lateral structures. This completed the posterior lateral arthrodesis.  We then obtained hemostasis using bipolar electrocautery. We irrigated the wound out with bacitracin solution. We inspected the thecal sac and nerve roots and noted they were well decompressed. We then removed the retractor. I placed vancomycin powder in the wound. We placed a medium Hemovac drain in the epidural space and tunneled out through separate stab wound. We reapproximated patient's thoracolumbar fascia with interrupted #1 Vicryl suture. We reapproximated patient's subcutaneous tissue with  interrupted 2-0 Vicryl suture. The reapproximated patient's skin with Steri-Strips and benzoin. The wound was then coated with bacitracin ointment. A sterile dressing was applied. The drapes were removed. The patient was subsequently returned to the supine position where they were extubated by the anesthesia team. He was then transported to the post anesthesia care unit in stable condition. All sponge instrument and needle counts were reportedly correct at the end of this case.

## 2015-11-08 ENCOUNTER — Telehealth: Payer: Self-pay

## 2015-11-08 LAB — GLUCOSE, CAPILLARY
GLUCOSE-CAPILLARY: 168 mg/dL — AB (ref 65–99)
GLUCOSE-CAPILLARY: 129 mg/dL — AB (ref 65–99)
Glucose-Capillary: 109 mg/dL — ABNORMAL HIGH (ref 65–99)
Glucose-Capillary: 126 mg/dL — ABNORMAL HIGH (ref 65–99)
Glucose-Capillary: 128 mg/dL — ABNORMAL HIGH (ref 65–99)

## 2015-11-08 LAB — BASIC METABOLIC PANEL
ANION GAP: 9 (ref 5–15)
BUN: 15 mg/dL (ref 6–20)
CALCIUM: 8.2 mg/dL — AB (ref 8.9–10.3)
CO2: 24 mmol/L (ref 22–32)
Chloride: 108 mmol/L (ref 101–111)
Creatinine, Ser: 0.88 mg/dL (ref 0.44–1.00)
GFR calc Af Amer: >60 mL/min (ref >60)
GLUCOSE: 143 mg/dL — AB (ref 65–99)
POTASSIUM: 3.9 mmol/L (ref 3.5–5.1)
SODIUM: 141 mmol/L (ref 135–145)

## 2015-11-08 LAB — CBC
HCT: 38.4 % (ref 36.0–46.0)
Hemoglobin: 12.9 g/dL (ref 12.0–15.0)
MCH: 32.6 pg (ref 26.0–34.0)
MCHC: 33.6 g/dL (ref 30.0–36.0)
MCV: 97 fL (ref 78.0–100.0)
PLATELETS: 194 10*3/uL (ref 150–400)
RBC: 3.96 MIL/uL (ref 3.87–5.11)
RDW: 13.5 % (ref 11.5–15.5)
WBC: 10.3 10*3/uL (ref 4.0–10.5)

## 2015-11-08 MED ORDER — GABAPENTIN 400 MG PO CAPS
400.0000 mg | ORAL_CAPSULE | Freq: Four times a day (QID) | ORAL | Status: DC
  Administered 2015-11-08 – 2015-11-09 (×3): 400 mg via ORAL
  Filled 2015-11-08 (×3): qty 1

## 2015-11-08 NOTE — Progress Notes (Signed)
Utilization review completed.  

## 2015-11-08 NOTE — Telephone Encounter (Signed)
Pt left v/m; pt getting 60 carbs per meal and Dr Arnoldo Morale prescribed insulin;  pt thinks ridiculous to be getting 60 carbs per meal. Pt does not want to start insulin. Pt request cb with Dr Alla German opinion about 60 carbs being to much per meal and also does Dr Silvio Pate think pt should be taking insulin.

## 2015-11-08 NOTE — Evaluation (Signed)
Physical Therapy Evaluation Patient Details Name: [name] MRN: [id number] DOB: 1945-12-06 Today's Date: 11/08/2015   History of Present Illness  Pt is a 70 y/o female who presents s/p L3-L5 TLIF on 11/07/15.  Clinical Impression  Pt admitted with above diagnosis. Pt currently with functional limitations due to the deficits listed below (see PT Problem List). At the time of PT eval pt was able to perform transfers and ambulation with min guard to min assist. Pt very distracted during session and appeared agitated/frustrated at times with therapist's cueing. Overall pt has poor safety awareness and requires assistance for most aspects of OOB mobility. Discussed the possibility of rehab prior to return home alone and pt was agreeable. Pt will benefit from skilled PT to increase their independence and safety with mobility to allow discharge to the venue listed below.       Follow Up Recommendations SNF;Supervision for mobility/OOB    Equipment Recommendations  Rolling walker with 5" wheels;3in1 (PT) (toilet riser and/or tub bench)    Recommendations for Other Services OT consult     Precautions / Restrictions Precautions Precautions: Fall;Back Precaution Booklet Issued: Yes (comment) Precaution Comments: Reviewed precautions verbally during functional mobility. Would benefit from further review of handout.  Required Braces or Orthoses: Spinal Brace Spinal Brace: Lumbar corset;Applied in sitting position Restrictions Weight Bearing Restrictions: No      Mobility  Bed Mobility Overal bed mobility: Needs Assistance Bed Mobility: Sidelying to Sit   Sidelying to sit: Min guard       General bed mobility comments: Pt used rails; PT assist with shoulder to full sitting  Transfers Overall transfer level: Needs assistance Equipment used: Rolling walker (2 wheeled) Transfers: Sit to/from Stand Sit to Stand: Min assist         General transfer comment: Needs constant VCs of  back precautions and posture. Needed assitance with balance; was unsteady.   Ambulation/Gait Ambulation/Gait assistance: Min assist Ambulation Distance (Feet): 25 Feet Assistive device: Rolling walker (2 wheeled) Gait Pattern/deviations: Step-to pattern;Decreased stride length;Narrow base of support Gait velocity: decreased Gait velocity interpretation: Below normal speed for age/gender General Gait Details: needs constant VCs of back precautions for posture and not to twist. Needed help moving walker in sidestepping and in bathroom.  Stairs            Wheelchair Mobility    Modified Rankin (Stroke Patients Only)       Balance Overall balance assessment: Needs assistance Sitting-balance support: Single extremity supported;Feet supported Sitting balance-Leahy Scale: Poor Sitting balance - Comments: uses rail on bed   Standing balance support: Bilateral upper extremity supported Standing balance-Leahy Scale: Poor Standing balance comment: relies on UE support.                              Pertinent Vitals/Pain Pain Assessment: Faces Faces Pain Scale: Hurts even more    Home Living Family/patient expects to be discharged to:: Private residence Living Arrangements: Alone Available Help at Discharge: Family;Neighbor;Available PRN/intermittently Type of Home: Mobile home Home Access: Stairs to enter Entrance Stairs-Rails: None Entrance Stairs-Number of Steps: 5 stairs to back door Home Layout: One level Home Equipment: None      Prior Function Level of Independence: Independent               Hand Dominance   Dominant Hand: Right    Extremity/Trunk Assessment   Upper Extremity Assessment: Overall WFL for tasks assessed  Lower Extremity Assessment: Generalized weakness      Cervical / Trunk Assessment: Other exceptions  Communication   Communication: No difficulties  Cognition Arousal/Alertness: Awake/alert Behavior During  Therapy: Agitated Overall Cognitive Status: Within Functional Limits for tasks assessed       Memory: Decreased recall of precautions              General Comments General comments (skin integrity, edema, etc.): VCs for back precautions and posture.     Exercises        Assessment/Plan    PT Assessment Patient needs continued PT services  PT Diagnosis Difficulty walking;Acute pain   PT Problem List Decreased strength;Decreased range of motion;Decreased activity tolerance;Decreased balance;Decreased mobility;Decreased knowledge of use of DME;Decreased safety awareness;Decreased knowledge of precautions;Pain  PT Treatment Interventions DME instruction;Gait training;Stair training;Functional mobility training;Therapeutic activities;Therapeutic exercise;Neuromuscular re-education;Patient/family education   PT Goals (Current goals can be found in the Care Plan section) Acute Rehab PT Goals Patient Stated Goal: Pt did not state any goals PT Goal Formulation: With patient Time For Goal Achievement: 11/15/15 Potential to Achieve Goals: Good    Frequency Min 5X/week   Barriers to discharge Decreased caregiver support Pt lives alone    Co-evaluation               End of Session Equipment Utilized During Treatment: Gait belt;Back brace Activity Tolerance: Patient limited by pain (agitated at times) Patient left: in chair;with call bell/phone within reach Nurse Communication: Mobility status;Precautions         Time: [id number] PT Time Calculation (min) (ACUTE ONLY): 46 min   Charges:   PT Evaluation $PT Eval Moderate Complexity: 1 Procedure PT Treatments $Gait Training: 23-37 mins   PT G Codes:        Rolinda Roan 11/26/2015, 9:43 AM   Rolinda Roan, PT, DPT Acute Rehabilitation Services Pager: [phone]

## 2015-11-08 NOTE — Anesthesia Postprocedure Evaluation (Signed)
Anesthesia Post Note  Patient: [name]  Procedure(s) Performed: Procedure(s) (LRB): Lumbar three-four Lumbar four-five  transforaminal lumbar interbody fusion with interbody prosthesis posterior lateral arthrodesis and posterior segmental instrumentation (N/A)  Patient location during evaluation: PACU Anesthesia Type: General Level of consciousness: awake and alert and patient cooperative Pain management: pain level controlled Vital Signs Assessment: post-procedure vital signs reviewed and stable Respiratory status: spontaneous breathing and respiratory function stable Cardiovascular status: stable Anesthetic complications: no    Last Vitals:  Filed Vitals:   11/08/15 0500 11/08/15 0837  BP: 147/56 139/77  Pulse: 81 91  Temp: 37.1 C 36.7 C  Resp: 18 18    Last Pain:  Filed Vitals:   11/08/15 0855  PainSc: 7                  Paytan Recine S

## 2015-11-08 NOTE — Telephone Encounter (Signed)
Please let her know they do things differently in the hospital. She should speak to the dieticians to adjust her diet to what she usually eats. Even if they give her insulin in the hospital--she won't need it at home

## 2015-11-08 NOTE — Clinical Social Work Note (Signed)
Clinical Social Work Assessment  Patient Details  Name: [name] MRN: [id number] Date of Birth: 02-11-1946  Date of referral:  11/08/15               Reason for consult:  Facility Placement                Permission sought to share information with:  Facility Sport and exercise psychologist, Family Supports Permission granted to share information::  Yes, Verbal Permission Granted  Name::     Valarie Merino  Agency::  [facility]  Relationship::  Son  Contact Information:  [phone]  Housing/Transportation Living arrangements for the past 2 months:  [place] of Information:  Patient Patient Interpreter Needed:  None Criminal Activity/Legal Involvement Pertinent to Current Situation/Hospitalization:  No - Comment as needed Significant Relationships:  Adult Children, Friend Lives with:  Self Do you feel safe going back to the place where you live?  No Need for family participation in patient care:  Yes (Comment)  Care giving concerns:  CSW received referral for possible SNF placement at time of discharge. CSW met with patient regarding PT recommendation of SNF placement at time of discharge. Patient reports being currently unable to care for herself at home given patient's current physical needs and fall risk. Patient expressed understanding of PT recommendation and are agreeable to SNF placement at time of discharge. CSW to continue to follow and assist with discharge planning needs.   Social Worker assessment / plan:  CSW spoke with patient concerning possibility of rehab at [facility] before returning home.  Employment status:  Retired Nurse, adult PT Recommendations:  [place] / Referral to community resources:  [place]  Patient/Family's Response to care:  Patient recognizes need for rehab before returning home and is agreeable to a SNF in [facility] and will go by [employer].  Patient/Family's  Understanding of and Emotional Response to Diagnosis, Current Treatment, and Prognosis:  Patient is realistic regarding therapy needs. No questions/concerns about plan or treatment.    Emotional Assessment Appearance:  Appears stated age Attitude/Demeanor/Rapport:   (Appropriate) Affect (typically observed):  Accepting, Appropriate Orientation:  Oriented to Self, Oriented to Place, Oriented to  Time, Oriented to Situation Alcohol / Substance use:  Never Used Psych involvement (Current and /or in the community):  No (Comment)  Discharge Needs  Concerns to be addressed:  Care Coordination Readmission within the last 30 days:  No Current discharge risk:  None Barriers to Discharge:  Continued Medical Work up   [employer], [place] 11/08/2015, 4:56 PM

## 2015-11-08 NOTE — Progress Notes (Signed)
Responded to spiritual consult to visit with patient.  Patient was very tearful and concerned about her healing. Her concern was not medical related rather they were religious and spiritual in nature. Patient expectations has to do with finding forgiveness and  acceptance with self and dealing with quilt of the past. I listened empathetically and provided guilt counseling. I helped patient  Identify ways she can realign her behavior with her personal values. I helped patient to understand and respond beneficially to emotions of guilt or shame. I prayed with patient and patient appeared to be calmer and willing to consider counsel provided. Chaplain available as needed.   11/08/15 0900  Clinical Encounter Type  Visited With Patient;Health care provider  Visit Type Initial;Spiritual support  Referral From Physician  Spiritual Encounters  Spiritual Needs Prayer;Emotional  Stress Factors  Patient Stress Factors Exhausted  Jaclynn Major, [place]

## 2015-11-08 NOTE — NC FL2 (Signed)
[place] LEVEL OF CARE SCREENING TOOL     IDENTIFICATION  Patient Name: [name] Birthdate: 1946/04/17 Sex: female Admission Date (Current Location): 11/07/2015  [facility] and [place] Number:  Herbalist and Address:  [facility] [place]. [facility], [place] [address], [place], [place] [address]      Provider Number: [id number]  Attending Physician Name and Address:  Newman Pies, MD  Relative Name and Phone Number:  Valarie Merino, son, [phone]    Current Level of Care: Hospital Recommended Level of Care: [place] Prior Approval Number:    Date Approved/Denied:   PASRR Number: [id number]  Discharge Plan: SNF    Current Diagnoses: Patient Active Problem List   Diagnosis Date Noted  . Spondylolisthesis of lumbar region 11/07/2015  . Preop cardiovascular exam 11/05/2015  . Abnormal EKG 11/05/2015  . Eye lump 09/17/2015  . Chronic back pain 03/29/2015  . Preventative health care 01/10/2015  . Osteoarthritis, hip, bilateral 11/28/2014  . Peripheral neuropathy ([place]) 11/28/2014  . Prediabetes 01/20/2014  . Episodic mood disorder ([place]) 01/20/2014  . Memory loss 12/26/2013  . Bleeding hemorrhoid 11/07/2013  . IBS (irritable bowel syndrome) 12/27/2012  . Essential hypertension, benign 08/06/2010    Orientation RESPIRATION BLADDER Height & Weight     Self, Time, Situation, Place  Normal Continent Weight: 209 lb (94.802 kg) Height:  5\' 4"  (162.6 cm)  BEHAVIORAL SYMPTOMS/MOOD NEUROLOGICAL BOWEL NUTRITION STATUS      Continent  (Please see DC summary)  AMBULATORY STATUS COMMUNICATION OF NEEDS Skin   Extensive Assist Verbally Surgical wounds (Incision on back)                       Personal Care Assistance Level of Assistance  Bathing, Feeding, Dressing Bathing Assistance: Maximum assistance Feeding assistance: Independent Dressing Assistance: Limited assistance     Functional Limitations Info              SPECIAL CARE FACTORS FREQUENCY  PT (By licensed PT)     PT Frequency: 5x/week              Contractures      Additional Factors Info  Code Status, Allergies, Insulin Sliding Scale Code Status Info: Not on file Allergies Info: NKA   Insulin Sliding Scale Info: insulin aspart (novoLOG) injection 0-20 Units; insulin aspart (novoLOG) injection 0-5 Units;       Current Medications (11/08/2015):  This is the current hospital active medication list Current Facility-Administered Medications  Medication Dose Route Frequency Provider Last Rate Last Dose  . acetaminophen (TYLENOL) tablet 650 mg  650 mg Oral Q4H PRN Newman Pies, MD       Or  . acetaminophen (TYLENOL) suppository 650 mg  650 mg Rectal Q4H PRN Newman Pies, MD      . alum & mag hydroxide-simeth (MAALOX/MYLANTA) 200-200-20 MG/5ML suspension 30 mL  30 mL Oral Q6H PRN Newman Pies, MD      . bisacodyl (DULCOLAX) suppository 10 mg  10 mg Rectal Daily PRN Newman Pies, MD      . bupivacaine liposome (EXPAREL) 1.3 % injection 266 mg  20 mL Infiltration Once Newman Pies, MD      . diazepam (VALIUM) tablet 5 mg  5 mg Oral Q6H PRN Newman Pies, MD   5 mg at 11/08/15 1643  . docusate sodium (COLACE) capsule 100 mg  100 mg Oral BID Newman Pies, MD   100 mg at 11/08/15 0914  .  gabapentin (NEURONTIN) capsule 400 mg  400 mg Oral QID Newman Pies, MD      . lisinopril (PRINIVIL,ZESTRIL) tablet 20 mg  20 mg Oral Daily Newman Pies, MD   20 mg at 11/08/15 0916   And  . hydrochlorothiazide (HYDRODIURIL) tablet 25 mg  25 mg Oral Daily Newman Pies, MD   25 mg at 11/08/15 0916  . insulin aspart (novoLOG) injection 0-20 Units  0-20 Units Subcutaneous TID WC Newman Pies, MD   4 Units at 11/08/15 1326  . insulin aspart (novoLOG) injection 0-5 Units  0-5 Units Subcutaneous QHS Newman Pies, MD   0 Units at 11/07/15 2153  . lactated ringers infusion   Intravenous Continuous Albertha Ghee, MD   Stopped at  11/07/15 1515  . lactated ringers infusion   Intravenous Continuous Newman Pies, MD      . menthol-cetylpyridinium (CEPACOL) lozenge 3 mg  1 lozenge Oral PRN Newman Pies, MD       Or  . phenol [facility]) mouth spray 1 spray  1 spray Mouth/Throat PRN Newman Pies, MD      . morphine 2 MG/ML injection 1-4 mg  1-4 mg Intravenous Q3H PRN Newman Pies, MD   2 mg at 11/07/15 2005  . ondansetron (ZOFRAN) injection 4 mg  4 mg Intravenous Q4H PRN Newman Pies, MD      . oxyCODONE-acetaminophen (PERCOCET/ROXICET) 5-325 MG per tablet 1-2 tablet  1-2 tablet Oral Q4H PRN Newman Pies, MD   2 tablet at 11/08/15 1644     Discharge Medications: Please see discharge summary for a list of discharge medications.  Relevant Imaging Results:  Relevant Lab Results:   Additional Information SSN: [place], [place]

## 2015-11-08 NOTE — Progress Notes (Signed)
Patient ID: [name], female   DOB: 08/15/1946, 70 y.o.   MRN: [id number] Subjective:  The patient is alert and pleasant. She is accompanied by a friend. She looks well. She wants to decrease her Neurontin because it makes her sleepy.  Objective: Vital signs in last 24 hours: Temp:  [97.5 F (36.4 C)-98.8 F (37.1 C)] 97.6 F (36.4 C) (03/02 1209) Pulse Rate:  [60-97] 97 (03/02 1209) Resp:  [12-19] 18 (03/02 1209) BP: (121-155)/(56-90) 129/66 mmHg (03/02 1209) SpO2:  [94 %-100 %] 97 % (03/02 1209)  Intake/Output from previous day: 03/01 0701 - 03/02 0700 In: 2825 [I.V.:2450; Blood:125; IV Piggyback:250] Out: [id number] [Urine:3145; Blood:500] Intake/Output this shift:    Physical exam the patient is alert and pleasant. She looks well. She is moving her lower extremities well.  Lab Results:  Recent Labs  11/08/15 0520  WBC 10.3  HGB 12.9  HCT 38.4  PLT 194   BMET  Recent Labs  11/08/15 0520  NA 141  K 3.9  CL 108  CO2 24  GLUCOSE 143*  BUN 15  CREATININE 0.88  CALCIUM 8.2*    Studies/Results: Dg Lumbar Spine 2-3 Views  11/07/2015  CLINICAL DATA:  Lumbar fusion L3-L4 and L4-L5 level EXAM: LUMBAR SPINE - 2-3 VIEW; DG C-ARM 61-120 MIN COMPARISON:  Intraoperative localization film same day FINDINGS: Three views of the lumbar spine submitted. Posterior transpedicular metallic screws are noted at L3, L4 and L5 level. The alignment is preserved. Persistent mild anterolisthesis L4 on L5. Postsurgical disc spacer material noted at L3-L4 and L4-L5 level. IMPRESSION: Posterior transpedicular metallic screws are noted at L3, L4 and L5 level. The alignment is preserved. Persistent mild anterolisthesis L4 on L5. Postsurgical disc spacer material noted at L3-L4 and L4-L5 level. Please see the operative report. Fluoroscopy time 43 seconds. Electronically Signed   By: Lahoma Crocker M.D.   On: 11/07/2015 16:27   Dg Lumbar Spine 1 View  11/07/2015  CLINICAL DATA:  Intraoperative  localization film patient for L3-4 and L4-5 fusion. EXAM: LUMBAR SPINE - 1 VIEW COMPARISON:  MRI lumbar spine 05/21/2015. FINDINGS: A single intraoperative view of the lumbar spine in the lateral projection is provided. Image demonstrates a probe at the L5-S1 level. IMPRESSION: As above. Electronically Signed   By: Inge Rise M.D.   On: 11/07/2015 14:37   Dg C-arm 61-120 Min  11/07/2015  CLINICAL DATA:  Lumbar fusion L3-L4 and L4-L5 level EXAM: LUMBAR SPINE - 2-3 VIEW; DG C-ARM 61-120 MIN COMPARISON:  Intraoperative localization film same day FINDINGS: Three views of the lumbar spine submitted. Posterior transpedicular metallic screws are noted at L3, L4 and L5 level. The alignment is preserved. Persistent mild anterolisthesis L4 on L5. Postsurgical disc spacer material noted at L3-L4 and L4-L5 level. IMPRESSION: Posterior transpedicular metallic screws are noted at L3, L4 and L5 level. The alignment is preserved. Persistent mild anterolisthesis L4 on L5. Postsurgical disc spacer material noted at L3-L4 and L4-L5 level. Please see the operative report. Fluoroscopy time 43 seconds. Electronically Signed   By: Lahoma Crocker M.D.   On: 11/07/2015 16:27    Assessment/Plan: Postop day #1: The patient is doing well. I will decrease her Neurontin. We will continue to mobilize her. She may go home tomorrow. I have answered all her questions.  LOS: 1 day     Townsend Cudworth D 11/08/2015, 2:33 PM

## 2015-11-09 ENCOUNTER — Encounter (HOSPITAL_COMMUNITY): Payer: Self-pay | Admitting: Neurosurgery

## 2015-11-09 DIAGNOSIS — M4326 Fusion of spine, lumbar region: Secondary | ICD-10-CM | POA: Diagnosis not present

## 2015-11-09 DIAGNOSIS — M1990 Unspecified osteoarthritis, unspecified site: Secondary | ICD-10-CM | POA: Diagnosis not present

## 2015-11-09 DIAGNOSIS — M4806 Spinal stenosis, lumbar region: Secondary | ICD-10-CM | POA: Diagnosis not present

## 2015-11-09 DIAGNOSIS — I10 Essential (primary) hypertension: Secondary | ICD-10-CM | POA: Diagnosis not present

## 2015-11-09 DIAGNOSIS — M5136 Other intervertebral disc degeneration, lumbar region: Secondary | ICD-10-CM | POA: Diagnosis not present

## 2015-11-09 DIAGNOSIS — E1140 Type 2 diabetes mellitus with diabetic neuropathy, unspecified: Secondary | ICD-10-CM | POA: Diagnosis not present

## 2015-11-09 DIAGNOSIS — K5900 Constipation, unspecified: Secondary | ICD-10-CM | POA: Diagnosis not present

## 2015-11-09 DIAGNOSIS — R2689 Other abnormalities of gait and mobility: Secondary | ICD-10-CM | POA: Diagnosis not present

## 2015-11-09 DIAGNOSIS — M4800 Spinal stenosis, site unspecified: Secondary | ICD-10-CM | POA: Diagnosis not present

## 2015-11-09 DIAGNOSIS — E119 Type 2 diabetes mellitus without complications: Secondary | ICD-10-CM | POA: Diagnosis not present

## 2015-11-09 DIAGNOSIS — M4316 Spondylolisthesis, lumbar region: Secondary | ICD-10-CM | POA: Diagnosis not present

## 2015-11-09 DIAGNOSIS — R278 Other lack of coordination: Secondary | ICD-10-CM | POA: Diagnosis not present

## 2015-11-09 DIAGNOSIS — M6281 Muscle weakness (generalized): Secondary | ICD-10-CM | POA: Diagnosis not present

## 2015-11-09 DIAGNOSIS — G629 Polyneuropathy, unspecified: Secondary | ICD-10-CM | POA: Diagnosis not present

## 2015-11-09 DIAGNOSIS — M549 Dorsalgia, unspecified: Secondary | ICD-10-CM | POA: Diagnosis not present

## 2015-11-09 LAB — GLUCOSE, CAPILLARY
GLUCOSE-CAPILLARY: 123 mg/dL — AB (ref 65–99)
Glucose-Capillary: 107 mg/dL — ABNORMAL HIGH (ref 65–99)

## 2015-11-09 MED ORDER — DOCUSATE SODIUM 100 MG PO CAPS: 100.0000 mg | ORAL_CAPSULE | Freq: Two times a day (BID) | ORAL | Status: DC

## 2015-11-09 MED ORDER — OXYCODONE-ACETAMINOPHEN 10-325 MG PO TABS: 1.0000 | ORAL_TABLET | ORAL | Status: DC | PRN

## 2015-11-09 MED ORDER — CYCLOBENZAPRINE HCL 10 MG PO TABS: 10.0000 mg | ORAL_TABLET | Freq: Three times a day (TID) | ORAL | Status: DC | PRN

## 2015-11-09 NOTE — Telephone Encounter (Signed)
Left message on voice mail  to call back

## 2015-11-09 NOTE — Progress Notes (Signed)
PT Cancellation Note  Patient Details Name: [name] MRN: [id number] DOB: [birth year]   Cancelled Treatment:    Reason Eval/Treat Not Completed: Patient declined, no reason specified Declines to work with therapy at this time. States she has walked 3 times today and has to fill out paperwork with a  Friend, in the room before she leaves. Will follow until discharge.  Ellouise Newer 11/09/2015, 2:01 PM  Camille Bal Leonore, [place]

## 2015-11-09 NOTE — Progress Notes (Signed)
Patient will DC to: [facility] Anticipated DC date: 11/09/15 Family notified: Friend Transport by: [facility]  CSW signing off.  Cedric Fishman, [place] Social Worker [phone]

## 2015-11-09 NOTE — Telephone Encounter (Signed)
Spoke to patient. She said they are asking her what she wants to eat now instead of forcing the diet they had planned. She will let us know when she moves to a long term rehab facility.

## 2015-11-09 NOTE — Discharge Summary (Signed)
Physician Discharge Summary  Patient ID: [name] MRN: [id number] DOB/AGE: 12-12-1945 70 y.o.  Admit date: 11/07/2015 Discharge date: 11/09/2015  Admission Diagnoses: L3-4 and L4-5 spinal stenosis, spondylolisthesis, lumbago, lumbar radiculopathy, neurogenic claudication, thoracolumbar scoliosis  Discharge Diagnoses: The same Active Problems:   Spondylolisthesis of lumbar region   Discharged Condition: good  Hospital Course: I performed an L3-4 and L4-5 decompression, instrumentation, and fusion on the patient on 11/07/2015. The surgery went well.  The patient's postoperative course was unremarkable. She was mobilized with PT. Arrangements were made for her to go to a skilled nursing facility to recover from her surgery. The patient was given written and oral discharge instructions. All her questions were answered.  Consults: Physical therapy Significant Diagnostic Studies: None Treatments: L3-4 and L4-5 decompression, instrumentation, and fusion. Discharge Exam: Blood pressure 130/65, pulse 83, temperature 98.9 F (37.2 C), temperature source Oral, resp. rate 20, height '5\' 4"'  (1.626 m), weight 94.802 kg (209 lb), SpO2 97 %. The patient is alert and pleasant. She looks well. Her dressing is clean and dry. She is appropriately sore. Her lower extremity strength is grossly normal.  Disposition: Skilled nursing facility  Discharge Instructions    Call MD for:  difficulty breathing, headache or visual disturbances    Complete by:  As directed      Call MD for:  extreme fatigue    Complete by:  As directed      Call MD for:  hives    Complete by:  As directed      Call MD for:  persistant dizziness or light-headedness    Complete by:  As directed      Call MD for:  persistant nausea and vomiting    Complete by:  As directed      Call MD for:  redness, tenderness, or signs of infection (pain, swelling, redness, odor or green/yellow discharge around incision site)    Complete  by:  As directed      Call MD for:  severe uncontrolled pain    Complete by:  As directed      Call MD for:  temperature >100.4    Complete by:  As directed      Diet - low sodium heart healthy    Complete by:  As directed      Discharge instructions    Complete by:  As directed   Call [phone] for a followup appointment. Take a stool softener while you are using pain medications.     Driving Restrictions    Complete by:  As directed   Do not drive for 2 weeks.     Increase activity slowly    Complete by:  As directed      Lifting restrictions    Complete by:  As directed   Do not lift more than 5 pounds. No excessive bending or twisting.     May shower / Bathe    Complete by:  As directed   He may shower after the pain she is removed 3 days after surgery. Leave the incision alone.     Remove dressing in 24 hours    Complete by:  As directed             Medication List    STOP taking these medications        ibuprofen 200 MG tablet  Commonly known as:  ADVIL,MOTRIN      TAKE these medications        ACCU-CHEK AVIVA  PLUS w/Device Kit  Use to test blood sugar once daily Dx: R73.03     BAYER MICROLET LANCETS lancets  Use as instructed to test blood sugar once daily dx: R73.03     CALCIUM-MAGNESIUM PO  Take 2 tablets by mouth daily.     cyclobenzaprine 10 MG tablet  Commonly known as:  FLEXERIL  Take 1 tablet (10 mg total) by mouth 3 (three) times daily as needed for muscle spasms.     docusate sodium 100 MG capsule  Commonly known as:  COLACE  Take 1 capsule (100 mg total) by mouth 2 (two) times daily.     Fish Oil 1000 MG Caps  Take 1 capsule by mouth daily.     gabapentin 800 MG tablet  Commonly known as:  NEURONTIN  Take 800 mg by mouth 4 (four) times daily.     glucose blood test strip  Commonly known as:  ACCU-CHEK AVIVA PLUS  Use to test blood sugar once daily Dx: R73.03     lisinopril-hydrochlorothiazide 20-25 MG tablet  Commonly known as:   PRINZIDE,ZESTORETIC  TAKE 2 TABLETS BY MOUTH DIALY     metFORMIN 500 MG 24 hr tablet  Commonly known as:  GLUCOPHAGE XR  Take 1 tablet (500 mg total) by mouth daily with breakfast.     multivitamin with minerals tablet  Take 1 tablet by mouth daily.     NON FORMULARY  Calcium 250 mg+ Magnesium 250 mg + Potassium 45 mg Takes 2 tablets daily.     OVER THE COUNTER MEDICATION  Take 2 tablets by mouth daily. Focus Formula     oxyCODONE-acetaminophen 10-325 MG tablet  Commonly known as:  PERCOCET  Take 1 tablet by mouth every 4 (four) hours as needed for pain.     Soya Lecithin 1200 MG Caps  Take 1 tablet by mouth daily.     vitamin C with rose hips 1000 MG tablet  Take 1,000 mg by mouth daily.         SignedOphelia Charter 11/09/2015, 7:38 AM

## 2015-11-09 NOTE — Progress Notes (Signed)
Patient alert and oriented, mae's well, voiding adequate amount of urine, swallowing without difficulty, c/o mild pain  Patient discharged to [facility]. Script and discharged instructions given to [facility] transporter.. Patient and family stated understanding of d/c instructions given and has an appointment with MD.

## 2015-11-09 NOTE — Progress Notes (Signed)
RN called report to facility but called was not forwarded to the appropriate person. The secretary stated" give me a number and the nurse will call you". But no call was receive from facility. Attempted to call again but still no report was given

## 2015-11-11 DIAGNOSIS — M549 Dorsalgia, unspecified: Secondary | ICD-10-CM | POA: Diagnosis not present

## 2015-11-11 DIAGNOSIS — G629 Polyneuropathy, unspecified: Secondary | ICD-10-CM | POA: Diagnosis not present

## 2015-11-11 DIAGNOSIS — M4800 Spinal stenosis, site unspecified: Secondary | ICD-10-CM | POA: Diagnosis not present

## 2015-11-11 DIAGNOSIS — E1140 Type 2 diabetes mellitus with diabetic neuropathy, unspecified: Secondary | ICD-10-CM | POA: Diagnosis not present

## 2015-11-11 DIAGNOSIS — M4326 Fusion of spine, lumbar region: Secondary | ICD-10-CM | POA: Diagnosis not present

## 2015-11-12 DIAGNOSIS — E119 Type 2 diabetes mellitus without complications: Secondary | ICD-10-CM | POA: Diagnosis not present

## 2015-11-12 DIAGNOSIS — M1990 Unspecified osteoarthritis, unspecified site: Secondary | ICD-10-CM | POA: Diagnosis not present

## 2015-11-12 DIAGNOSIS — I10 Essential (primary) hypertension: Secondary | ICD-10-CM | POA: Diagnosis not present

## 2015-11-12 DIAGNOSIS — M4806 Spinal stenosis, lumbar region: Secondary | ICD-10-CM | POA: Diagnosis not present

## 2015-11-13 MED FILL — Sodium Chloride IV Soln 0.9%: INTRAVENOUS | Qty: 2000 | Status: AC

## 2015-11-13 MED FILL — Heparin Sodium (Porcine) Inj 1000 Unit/ML: INTRAMUSCULAR | Qty: 30 | Status: AC

## 2015-11-15 DIAGNOSIS — M4806 Spinal stenosis, lumbar region: Secondary | ICD-10-CM | POA: Diagnosis not present

## 2015-11-15 DIAGNOSIS — M1990 Unspecified osteoarthritis, unspecified site: Secondary | ICD-10-CM | POA: Diagnosis not present

## 2015-11-15 DIAGNOSIS — K5900 Constipation, unspecified: Secondary | ICD-10-CM | POA: Diagnosis not present

## 2015-11-15 DIAGNOSIS — E119 Type 2 diabetes mellitus without complications: Secondary | ICD-10-CM | POA: Diagnosis not present

## 2015-11-19 ENCOUNTER — Telehealth: Payer: Self-pay

## 2015-11-19 NOTE — Telephone Encounter (Signed)
Pt had surgery recently and pt received oxycodone apap # 100 on 11/09/15 by Dr Arnoldo Morale. Pt is almost out of med and wants to know who to call for another rx. Advised to call Dr Arnoldo Morale office; pt voiced understanding and she will call Dr Arnoldo Morale office now.

## 2015-11-20 DIAGNOSIS — Z7984 Long term (current) use of oral hypoglycemic drugs: Secondary | ICD-10-CM | POA: Diagnosis not present

## 2015-11-20 DIAGNOSIS — R2689 Other abnormalities of gait and mobility: Secondary | ICD-10-CM | POA: Diagnosis not present

## 2015-11-20 DIAGNOSIS — M4316 Spondylolisthesis, lumbar region: Secondary | ICD-10-CM | POA: Diagnosis not present

## 2015-11-20 DIAGNOSIS — M5136 Other intervertebral disc degeneration, lumbar region: Secondary | ICD-10-CM | POA: Diagnosis not present

## 2015-11-20 DIAGNOSIS — Z4789 Encounter for other orthopedic aftercare: Secondary | ICD-10-CM | POA: Diagnosis not present

## 2015-11-20 DIAGNOSIS — R531 Weakness: Secondary | ICD-10-CM | POA: Diagnosis not present

## 2015-11-21 ENCOUNTER — Telehealth: Payer: Self-pay

## 2015-11-21 DIAGNOSIS — M4316 Spondylolisthesis, lumbar region: Secondary | ICD-10-CM | POA: Diagnosis not present

## 2015-11-21 DIAGNOSIS — Z4789 Encounter for other orthopedic aftercare: Secondary | ICD-10-CM | POA: Diagnosis not present

## 2015-11-21 DIAGNOSIS — Z7984 Long term (current) use of oral hypoglycemic drugs: Secondary | ICD-10-CM | POA: Diagnosis not present

## 2015-11-21 DIAGNOSIS — M5136 Other intervertebral disc degeneration, lumbar region: Secondary | ICD-10-CM | POA: Diagnosis not present

## 2015-11-21 DIAGNOSIS — R531 Weakness: Secondary | ICD-10-CM | POA: Diagnosis not present

## 2015-11-21 DIAGNOSIS — R2689 Other abnormalities of gait and mobility: Secondary | ICD-10-CM | POA: Diagnosis not present

## 2015-11-21 NOTE — Telephone Encounter (Signed)
Kennith Maes with [employer] left v/m as Juluis Rainier that pt had been admitted to hospital; spoke with pt  Last admission was 11/07/15; pt said she is doing very good and not in the hospital. FYI to Dr Silvio Pate.

## 2015-11-21 NOTE — Telephone Encounter (Signed)
I was aware that she went for elective surgery. This call is 2 weeks late

## 2015-11-22 DIAGNOSIS — Z7984 Long term (current) use of oral hypoglycemic drugs: Secondary | ICD-10-CM | POA: Diagnosis not present

## 2015-11-22 DIAGNOSIS — M5136 Other intervertebral disc degeneration, lumbar region: Secondary | ICD-10-CM | POA: Diagnosis not present

## 2015-11-22 DIAGNOSIS — R531 Weakness: Secondary | ICD-10-CM | POA: Diagnosis not present

## 2015-11-22 DIAGNOSIS — Z4789 Encounter for other orthopedic aftercare: Secondary | ICD-10-CM | POA: Diagnosis not present

## 2015-11-22 DIAGNOSIS — M4316 Spondylolisthesis, lumbar region: Secondary | ICD-10-CM | POA: Diagnosis not present

## 2015-11-22 DIAGNOSIS — R2689 Other abnormalities of gait and mobility: Secondary | ICD-10-CM | POA: Diagnosis not present

## 2015-11-23 DIAGNOSIS — M5136 Other intervertebral disc degeneration, lumbar region: Secondary | ICD-10-CM | POA: Diagnosis not present

## 2015-11-23 DIAGNOSIS — M4316 Spondylolisthesis, lumbar region: Secondary | ICD-10-CM | POA: Diagnosis not present

## 2015-11-23 DIAGNOSIS — R531 Weakness: Secondary | ICD-10-CM | POA: Diagnosis not present

## 2015-11-23 DIAGNOSIS — R2689 Other abnormalities of gait and mobility: Secondary | ICD-10-CM | POA: Diagnosis not present

## 2015-11-23 DIAGNOSIS — Z7984 Long term (current) use of oral hypoglycemic drugs: Secondary | ICD-10-CM | POA: Diagnosis not present

## 2015-11-23 DIAGNOSIS — Z4789 Encounter for other orthopedic aftercare: Secondary | ICD-10-CM | POA: Diagnosis not present

## 2015-11-27 ENCOUNTER — Telehealth: Payer: Self-pay | Admitting: Internal Medicine

## 2015-11-27 ENCOUNTER — Telehealth: Payer: Self-pay

## 2015-11-27 DIAGNOSIS — Z7984 Long term (current) use of oral hypoglycemic drugs: Secondary | ICD-10-CM | POA: Diagnosis not present

## 2015-11-27 DIAGNOSIS — R2689 Other abnormalities of gait and mobility: Secondary | ICD-10-CM | POA: Diagnosis not present

## 2015-11-27 DIAGNOSIS — M5136 Other intervertebral disc degeneration, lumbar region: Secondary | ICD-10-CM | POA: Diagnosis not present

## 2015-11-27 DIAGNOSIS — M4316 Spondylolisthesis, lumbar region: Secondary | ICD-10-CM | POA: Diagnosis not present

## 2015-11-27 DIAGNOSIS — R531 Weakness: Secondary | ICD-10-CM | POA: Diagnosis not present

## 2015-11-27 DIAGNOSIS — Z4789 Encounter for other orthopedic aftercare: Secondary | ICD-10-CM | POA: Diagnosis not present

## 2015-11-27 NOTE — Telephone Encounter (Signed)
No speech therapy until I see her

## 2015-11-27 NOTE — Telephone Encounter (Signed)
Let her know that we cannot accept an email with protected information from a regular email. She should print it and drop off or sign (or we can discuss it at her next appt here)

## 2015-11-27 NOTE — Telephone Encounter (Signed)
[name] OT with [facility] left v/m; Dr Arnoldo Morale did back surgery and set up home health;[name] is requesting verbal order for speech therapy for cognitive evaluation from Dr Silvio Pate; pt having problems with every day functions; pt gets confused about appts, taking meds and paying her bills. [name] request cb.

## 2015-11-27 NOTE — Telephone Encounter (Signed)
Pt called stating she left [facility] early She made a recording of important things on why she left early. She would like to send this to your email She left 11/16/15 and she is at home

## 2015-11-27 NOTE — Telephone Encounter (Signed)
Okay 

## 2015-11-27 NOTE — Telephone Encounter (Signed)
Pt will discuss at her next appointment Pt stated the nurses didn't give her, her meds They tried to give her the wrong meds And didn't give pain med on time

## 2015-11-28 DIAGNOSIS — R2689 Other abnormalities of gait and mobility: Secondary | ICD-10-CM | POA: Diagnosis not present

## 2015-11-28 DIAGNOSIS — M4316 Spondylolisthesis, lumbar region: Secondary | ICD-10-CM | POA: Diagnosis not present

## 2015-11-28 DIAGNOSIS — Z7984 Long term (current) use of oral hypoglycemic drugs: Secondary | ICD-10-CM | POA: Diagnosis not present

## 2015-11-28 DIAGNOSIS — R531 Weakness: Secondary | ICD-10-CM | POA: Diagnosis not present

## 2015-11-28 DIAGNOSIS — M5136 Other intervertebral disc degeneration, lumbar region: Secondary | ICD-10-CM | POA: Diagnosis not present

## 2015-11-28 DIAGNOSIS — Z4789 Encounter for other orthopedic aftercare: Secondary | ICD-10-CM | POA: Diagnosis not present

## 2015-11-28 NOTE — Telephone Encounter (Signed)
Izora Gala returned Shannon's call.  Please call her back at [phone].

## 2015-11-28 NOTE — Telephone Encounter (Signed)
Left message for [name].

## 2015-11-29 ENCOUNTER — Telehealth: Payer: Self-pay | Admitting: Internal Medicine

## 2015-11-29 ENCOUNTER — Telehealth: Payer: Self-pay

## 2015-11-29 DIAGNOSIS — M5136 Other intervertebral disc degeneration, lumbar region: Secondary | ICD-10-CM | POA: Diagnosis not present

## 2015-11-29 DIAGNOSIS — R531 Weakness: Secondary | ICD-10-CM | POA: Diagnosis not present

## 2015-11-29 DIAGNOSIS — R2689 Other abnormalities of gait and mobility: Secondary | ICD-10-CM | POA: Diagnosis not present

## 2015-11-29 DIAGNOSIS — M4316 Spondylolisthesis, lumbar region: Secondary | ICD-10-CM | POA: Diagnosis not present

## 2015-11-29 DIAGNOSIS — Z7984 Long term (current) use of oral hypoglycemic drugs: Secondary | ICD-10-CM | POA: Diagnosis not present

## 2015-11-29 DIAGNOSIS — Z4789 Encounter for other orthopedic aftercare: Secondary | ICD-10-CM | POA: Diagnosis not present

## 2015-11-29 NOTE — Telephone Encounter (Signed)
Spoke to patient. She will call Dr Arnoldo Morale. If needed, she will call and make an appointment here.

## 2015-11-29 NOTE — Telephone Encounter (Signed)
Pt concerned for 2 weeks pt hands are shaking more and pts feet and lower legs are swelling; pt tries to keep feet up while sitting and swelling does not go away after feet being up while sleeping at night.No SOB. pt began to cry; pt has problems remembering. Pt can go into a room and cannot remember why she went in the room. Pt does not want to schedule appt until Dr Silvio Pate is given this message to see what he suggest pt to do. Pt also wanted to know if would be better in Dr Alla German opinion to contact Dr Arnoldo Morale about these issues. Pt request cb. [facility].

## 2015-11-29 NOTE — Telephone Encounter (Signed)
Please tell patient that it sounds like she should be seen to check her BP and decide if changes are needed

## 2015-11-29 NOTE — Telephone Encounter (Signed)
Left message on [name]'s voice mail that Dr Silvio Pate said no speech therapy without him seeing her first

## 2015-11-29 NOTE — Telephone Encounter (Signed)
I really can't suggest anything without a more indepth discussion and exam here. Since she recently had surgery, it may be good for her to check with Dr Arnoldo Morale about these issues. If she has ongoing problems though, she should set up an appt

## 2015-11-29 NOTE — Telephone Encounter (Signed)
Rowe Clack from [facility] called with and update BP- right arm/sitting was 190/100 Left arm/sitting was 168/104 No s/s of distress and all other vitals were in normal limits. Pt reported having a lot of pain and being upset abut family issues.  Izora Gala does not need call back just wanted to update Dr. Silvio Pate

## 2015-11-30 NOTE — Telephone Encounter (Signed)
Spoke to patient. She has an appointment Tuesday

## 2015-12-03 DIAGNOSIS — Z4789 Encounter for other orthopedic aftercare: Secondary | ICD-10-CM | POA: Diagnosis not present

## 2015-12-03 DIAGNOSIS — Z7984 Long term (current) use of oral hypoglycemic drugs: Secondary | ICD-10-CM | POA: Diagnosis not present

## 2015-12-03 DIAGNOSIS — M5136 Other intervertebral disc degeneration, lumbar region: Secondary | ICD-10-CM | POA: Diagnosis not present

## 2015-12-03 DIAGNOSIS — R2689 Other abnormalities of gait and mobility: Secondary | ICD-10-CM | POA: Diagnosis not present

## 2015-12-03 DIAGNOSIS — M4316 Spondylolisthesis, lumbar region: Secondary | ICD-10-CM | POA: Diagnosis not present

## 2015-12-03 DIAGNOSIS — M6281 Muscle weakness (generalized): Secondary | ICD-10-CM | POA: Diagnosis not present

## 2015-12-03 DIAGNOSIS — R531 Weakness: Secondary | ICD-10-CM | POA: Diagnosis not present

## 2015-12-04 ENCOUNTER — Encounter: Payer: Self-pay | Admitting: Internal Medicine

## 2015-12-04 ENCOUNTER — Ambulatory Visit (INDEPENDENT_AMBULATORY_CARE_PROVIDER_SITE_OTHER): Payer: Commercial Managed Care - HMO | Admitting: Internal Medicine

## 2015-12-04 VITALS — BP 126/80 | HR 86 | Temp 97.9°F | Wt 210.0 lb

## 2015-12-04 DIAGNOSIS — M4316 Spondylolisthesis, lumbar region: Secondary | ICD-10-CM | POA: Diagnosis not present

## 2015-12-04 DIAGNOSIS — R609 Edema, unspecified: Secondary | ICD-10-CM | POA: Diagnosis not present

## 2015-12-04 DIAGNOSIS — I10 Essential (primary) hypertension: Secondary | ICD-10-CM | POA: Diagnosis not present

## 2015-12-04 DIAGNOSIS — R413 Other amnesia: Secondary | ICD-10-CM

## 2015-12-04 NOTE — Assessment & Plan Note (Signed)
Distracted, etc but no speech problems and doesn't suggest dementia It seems clearly related to her meds---discussed trying lower dose of oxycodone (cut the 15mg  in half for now) and wean the muscle relaxers as much as possible

## 2015-12-04 NOTE — Progress Notes (Signed)
Pre visit review using our clinic review tool, if applicable. No additional management support is needed unless otherwise documented below in the visit note. 

## 2015-12-04 NOTE — Patient Instructions (Signed)
Please try keeping your legs up and wearing support hose if you can. I think your blood pressure is fine. Try reducing your medications--cut the oxycodone in half and try only 7.5mg  at a time. Limit the muscle relaxers to the lowest possible dose.

## 2015-12-04 NOTE — Assessment & Plan Note (Signed)
This is mild I recommended elevation and support hose only for now

## 2015-12-04 NOTE — Assessment & Plan Note (Signed)
BP Readings from Last 3 Encounters:  12/04/15 126/80  11/09/15 123/69  11/05/15 128/92   Episodic elevated BP but I don't think she needs more meds

## 2015-12-04 NOTE — Progress Notes (Signed)
Subjective:    Patient ID: [name], female    DOB: Nov 10, 1945, 70 y.o.   MRN: [id number]  HPI Here with several concerns after her back surgery Did have rehab at [facility] for 2 days--but left early due to much dissatisfaction  Reviewed her several page long list of concerns there Not much was medical per se  Some concerns by home health about elevated BP Very high at times and low at others (under 482 systolic at other times) Some dizziness-- even just sitting in a chair No headache Spasms of pain at times in low back/upper legs  Some feet swelling Notices it even in the morning--may worsen if lots of time on her feet Feels tight but not overtly painful Breathing is okay Sleeps flat in bed-- no PND  Has had some trouble "remembering what I am doing" Forgets she is boiling eggs in kitchen and distracted  Told she needs speech evaluation (though I am not sure why)  Current Outpatient Prescriptions on File Prior to Visit  Medication Sig Dispense Refill  . Ascorbic Acid (VITAMIN C WITH ROSE HIPS) 1000 MG tablet Take 1,000 mg by mouth daily.    Marland Kitchen BAYER MICROLET LANCETS lancets Use as instructed to test blood sugar once daily dx: R73.03 100 each 1  . Blood Glucose Monitoring Suppl (ACCU-CHEK AVIVA PLUS) w/Device KIT Use to test blood sugar once daily Dx: R73.03 1 kit 0  . CALCIUM-MAGNESIUM PO Take 2 tablets by mouth daily.    . cyclobenzaprine (FLEXERIL) 10 MG tablet Take 1 tablet (10 mg total) by mouth 3 (three) times daily as needed for muscle spasms. 50 tablet 1  . docusate sodium (COLACE) 100 MG capsule Take 1 capsule (100 mg total) by mouth 2 (two) times daily. 60 capsule 0  . gabapentin (NEURONTIN) 800 MG tablet Take 800 mg by mouth 4 (four) times daily.     Marland Kitchen glucose blood (ACCU-CHEK AVIVA PLUS) test strip Use to test blood sugar once daily Dx: R73.03 100 each 1  . lisinopril-hydrochlorothiazide (PRINZIDE,ZESTORETIC) 20-25 MG tablet TAKE 2 TABLETS BY MOUTH DIALY 180  tablet 1  . metFORMIN (GLUCOPHAGE XR) 500 MG 24 hr tablet Take 1 tablet (500 mg total) by mouth daily with breakfast. 90 tablet 3  . Multiple Vitamins-Minerals (MULTIVITAMIN WITH MINERALS) tablet Take 1 tablet by mouth daily.    . NON FORMULARY Calcium 250 mg+ Magnesium 250 mg + Potassium 45 mg Takes 2 tablets daily.    . Omega-3 Fatty Acids (FISH OIL) 1000 MG CAPS Take 1 capsule by mouth daily.    Marland Kitchen OVER THE COUNTER MEDICATION Take 2 tablets by mouth daily. Focus Formula    . Soya Lecithin 1200 MG CAPS Take 1 tablet by mouth daily.      No current facility-administered medications on file prior to visit.    No Known Allergies  Past Medical History  Diagnosis Date  . Hypertension   . Arthritis   . Lumbar stenosis   . Diabetes mellitus without complication ([place])   . Nocturia   . Confusion   . Wears glasses   . Memory loss   . Numbness and tingling     legs and feet  . Hepatitis     Past Surgical History  Procedure Laterality Date  . Cesarean section  1978  . Hemorrhoid surgery  2015  . Colonoscopy    . Transforaminal lumbar interbody fusion (tlif) with pedicle screw fixation 2 level N/A 11/07/2015    Procedure: Lumbar  three-four Lumbar four-five  transforaminal lumbar interbody fusion with interbody prosthesis posterior lateral arthrodesis and posterior segmental instrumentation;  Surgeon: Newman Pies, MD;  Location: [place] [facility];  Service: Neurosurgery;  Laterality: N/A;    Family History  Problem Relation Age of Onset  . Hypertension Other   . Cancer Other     ovarian  . Cancer Mother   . Heart disease Father     Social History   Social History  . Marital Status: Divorced    Spouse Name: N/A  . Number of Children: 4  . Years of Education: N/A   Occupational History  . Not on file.   Social History Main Topics  . Smoking status: Never Smoker   . Smokeless tobacco: Never Used  . Alcohol Use: No     Comment: rarely  . Drug Use: No  . Sexual Activity: Not  on file   Other Topics Concern  . Not on file   Social History Narrative   Has a living will.   Son is health care POA   Would desire CPR.  Would not want prolonged life support if futile.   Probably would not want tube feeds if cognitively unaware   Review of Systems  Sleeps is not so good-- but waking to take pain meds Upset about limiting of the pain meds     Objective:   Physical Exam  Constitutional: She appears well-developed and well-nourished. No distress.  Neck: Normal range of motion. Neck supple. No thyromegaly present.  Cardiovascular: Normal rate, regular rhythm and normal heart sounds.  Exam reveals no gallop.   No murmur heard. Pulmonary/Chest: Effort normal and breath sounds normal. No respiratory distress. She has no wheezes. She has no rales.  Musculoskeletal:  1+ non pitting edema  Lymphadenopathy:    She has no cervical adenopathy.  Psychiatric:  Flustered, emotional discussing her pain Coherent speech and engagement          Assessment & Plan:

## 2015-12-05 ENCOUNTER — Telehealth: Payer: Self-pay

## 2015-12-05 NOTE — Telephone Encounter (Signed)
Pt left v/m; pt feeling better and can lift legs higher than before; FYI for Dr Silvio Pate; pt also wanted Dr Silvio Pate to know that pts son helped put compression stockings on pt but when pt went to take compression stockings pt was alone and pt could not get hose off herself and the hose caused a lot of pain until a neighbor could come over and help pt take them off. Pt said she will wear if she has someone to help put the compression stockings on and have someone to help her take the hose off. Pt is elevating legs while sitting. FYI to Dr Silvio Pate.

## 2015-12-05 NOTE — Telephone Encounter (Signed)
Glad to hear she is feeling better I did discuss that trouble with getting hose on---didn't realize she would also have trouble getting them off. It would be fine to wear them when she has help available.

## 2015-12-06 DIAGNOSIS — M4316 Spondylolisthesis, lumbar region: Secondary | ICD-10-CM | POA: Diagnosis not present

## 2015-12-06 DIAGNOSIS — R531 Weakness: Secondary | ICD-10-CM | POA: Diagnosis not present

## 2015-12-06 DIAGNOSIS — M5136 Other intervertebral disc degeneration, lumbar region: Secondary | ICD-10-CM | POA: Diagnosis not present

## 2015-12-06 DIAGNOSIS — R2689 Other abnormalities of gait and mobility: Secondary | ICD-10-CM | POA: Diagnosis not present

## 2015-12-06 DIAGNOSIS — Z4789 Encounter for other orthopedic aftercare: Secondary | ICD-10-CM | POA: Diagnosis not present

## 2015-12-06 DIAGNOSIS — Z7984 Long term (current) use of oral hypoglycemic drugs: Secondary | ICD-10-CM | POA: Diagnosis not present

## 2015-12-10 ENCOUNTER — Telehealth: Payer: Self-pay

## 2015-12-10 DIAGNOSIS — R2689 Other abnormalities of gait and mobility: Secondary | ICD-10-CM | POA: Diagnosis not present

## 2015-12-10 DIAGNOSIS — Z4789 Encounter for other orthopedic aftercare: Secondary | ICD-10-CM | POA: Diagnosis not present

## 2015-12-10 DIAGNOSIS — Z7984 Long term (current) use of oral hypoglycemic drugs: Secondary | ICD-10-CM | POA: Diagnosis not present

## 2015-12-10 DIAGNOSIS — M4316 Spondylolisthesis, lumbar region: Secondary | ICD-10-CM | POA: Diagnosis not present

## 2015-12-10 DIAGNOSIS — M5136 Other intervertebral disc degeneration, lumbar region: Secondary | ICD-10-CM | POA: Diagnosis not present

## 2015-12-10 DIAGNOSIS — R531 Weakness: Secondary | ICD-10-CM | POA: Diagnosis not present

## 2015-12-10 NOTE — Telephone Encounter (Signed)
Spoke to the patient. She said it is getting worse. Said she can't use a paperclip or turn a key in a door. I decided to put in the 2pm slot on Thursday, April 6. If she has improvement, she will cancel.

## 2015-12-10 NOTE — Telephone Encounter (Signed)
Please check on her today and set up appt if needed

## 2015-12-10 NOTE — Telephone Encounter (Signed)
PLEASE NOTE: All timestamps contained within this report are represented as [place] Standard Time. CONFIDENTIALTY NOTICE: This fax transmission is intended only for the addressee. It contains information that is legally privileged, confidential or otherwise protected from use or disclosure. If you are not the intended recipient, you are strictly prohibited from reviewing, disclosing, copying using or disseminating any of this information or taking any action in reliance on or regarding this information. If you have received this fax in error, please notify us immediately by telephone so that we can arrange for its return to [place]. Phone: [phone], Toll-Free: [phone], Fax: [phone] Page: 1 of 2 Call Id: [id number] Mart Patient Name: [name] Gender: Female DOB: 1946-02-24 Age: 70 Y 17 M 7 D Return Phone Number: [phone] (Primary), [phone] (Secondary) Address: [place] [address] Client [facility] Night - Client Client Site [place] Physician Viviana Simpler Contact Type Call Who Is Calling Patient / Member / Family / Caregiver Call Type Triage / Clinical Relationship To Patient Self Return Phone Number [phone] (Primary) Chief Complaint Numbness Reason for Call Symptomatic / Request for Health Information Initial Comment Caller states her right thumb is weak PreDisposition Call Doctor Translation No Nurse Assessment Nurse: Whited, Therapist, sports, Electrical engineer Date/Time (Eastern Time): 12/09/2015 5:12:38 PM Confirm and document reason for call. If symptomatic, describe symptoms. You must click the next button to save text entered. ---Caller states her right thumb is weak - noticed it yesterday. Has the patient traveled out of the country within the last 30 days? ---Not Applicable Does the patient have any new or  worsening symptoms? ---Yes Will a triage be completed? ---Yes Related visit to physician within the last 2 weeks? ---No Does the PT have any chronic conditions? (i.e. diabetes, asthma, etc.) ---Yes List chronic conditions. ---Borderline Diabetes - Metformin Is this a behavioral health or substance abuse call? ---No Guidelines Guideline Title Affirmed Question Affirmed Notes Nurse Date/Time (Eastern Time) Neurologic Deficit [1] Numbness or tingling in one or both hands AND [2] is a chronic symptom (recurrent or ongoing AND present > 4 weeks) Whited, RN, Pat 12/09/2015 5:15:16 PM Disp. Time Eilene Ghazi Time) Disposition Final User 12/09/2015 5:22:48 PM See PCP When Office is Open (within 3 days) Yes Whited, RN, Fraser Din PLEASE NOTE: All timestamps contained within this report are represented as [place] Standard Time. CONFIDENTIALTY NOTICE: This fax transmission is intended only for the addressee. It contains information that is legally privileged, confidential or otherwise protected from use or disclosure. If you are not the intended recipient, you are strictly prohibited from reviewing, disclosing, copying using or disseminating any of this information or taking any action in reliance on or regarding this information. If you have received this fax in error, please notify us immediately by telephone so that we can arrange for its return to [place]. Phone: [phone], Toll-Free: [phone], Fax: [phone] Page: 2 of 2 Call Id: [id number] Caller Understands: Yes Disagree/Comply: Comply Care Advice Given Per Guideline SEE PCP WITHIN 3 DAYS: * You need to be seen within 2 or 3 days. Call your doctor during regular office hours and make an appointment. An urgent care center is often the best source of care if your doctor's office is closed or you can't get an appointment. NOTE: If office will be open tomorrow, tell caller to call then, not in 3 days. CALL BACK IF: * You become worse. CARE  ADVICE given  per Neurologic Deficit (Adult) guideline. Referrals REFERRED TO PCP OFFICE

## 2015-12-13 ENCOUNTER — Ambulatory Visit (INDEPENDENT_AMBULATORY_CARE_PROVIDER_SITE_OTHER): Payer: Commercial Managed Care - HMO | Admitting: Internal Medicine

## 2015-12-13 ENCOUNTER — Encounter: Payer: Self-pay | Admitting: Internal Medicine

## 2015-12-13 VITALS — BP 130/80 | HR 90 | Temp 98.3°F | Wt 206.0 lb

## 2015-12-13 DIAGNOSIS — Z7984 Long term (current) use of oral hypoglycemic drugs: Secondary | ICD-10-CM | POA: Diagnosis not present

## 2015-12-13 DIAGNOSIS — Z4789 Encounter for other orthopedic aftercare: Secondary | ICD-10-CM | POA: Diagnosis not present

## 2015-12-13 DIAGNOSIS — R29898 Other symptoms and signs involving the musculoskeletal system: Secondary | ICD-10-CM | POA: Insufficient documentation

## 2015-12-13 DIAGNOSIS — M6289 Other specified disorders of muscle: Secondary | ICD-10-CM | POA: Diagnosis not present

## 2015-12-13 DIAGNOSIS — M4316 Spondylolisthesis, lumbar region: Secondary | ICD-10-CM | POA: Diagnosis not present

## 2015-12-13 DIAGNOSIS — R2689 Other abnormalities of gait and mobility: Secondary | ICD-10-CM | POA: Diagnosis not present

## 2015-12-13 DIAGNOSIS — R531 Weakness: Secondary | ICD-10-CM | POA: Diagnosis not present

## 2015-12-13 DIAGNOSIS — M5136 Other intervertebral disc degeneration, lumbar region: Secondary | ICD-10-CM | POA: Diagnosis not present

## 2015-12-13 NOTE — Progress Notes (Signed)
Pre visit review using our clinic review tool, if applicable. No additional management support is needed unless otherwise documented below in the visit note. 

## 2015-12-13 NOTE — Progress Notes (Signed)
Subjective:    Patient ID: [name], female    DOB: 01/06/1946, 70 y.o.   MRN: [id number]  HPI Here due to hand pain  She has noticed trouble with her right hand--for a few days Can't separate the fingers like she can on the left Hand is weak Tingling briefly in thumb--better now Now 4th and 5th fingers are tingling and feel weak Can't pinch things as well  BP checked by therapist 138 /99, 152/101, 135/65, 153/100 Has improved greatly with the therapist No headaches No CP, SOB  Feels her distractability is better Has cut back on narcotic and muscle relaxants  Current Outpatient Prescriptions on File Prior to Visit  Medication Sig Dispense Refill  . Ascorbic Acid (VITAMIN C WITH ROSE HIPS) 1000 MG tablet Take 1,000 mg by mouth daily.    Marland Kitchen BAYER MICROLET LANCETS lancets Use as instructed to test blood sugar once daily dx: R73.03 100 each 1  . Blood Glucose Monitoring Suppl (ACCU-CHEK AVIVA PLUS) w/Device KIT Use to test blood sugar once daily Dx: R73.03 1 kit 0  . CALCIUM-MAGNESIUM PO Take 2 tablets by mouth daily.    . cyclobenzaprine (FLEXERIL) 10 MG tablet Take 1 tablet (10 mg total) by mouth 3 (three) times daily as needed for muscle spasms. 50 tablet 1  . docusate sodium (COLACE) 100 MG capsule Take 1 capsule (100 mg total) by mouth 2 (two) times daily. 60 capsule 0  . gabapentin (NEURONTIN) 800 MG tablet Take 800 mg by mouth 4 (four) times daily.     Marland Kitchen glucose blood (ACCU-CHEK AVIVA PLUS) test strip Use to test blood sugar once daily Dx: R73.03 100 each 1  . lisinopril-hydrochlorothiazide (PRINZIDE,ZESTORETIC) 20-25 MG tablet TAKE 2 TABLETS BY MOUTH DIALY 180 tablet 1  . metFORMIN (GLUCOPHAGE XR) 500 MG 24 hr tablet Take 1 tablet (500 mg total) by mouth daily with breakfast. 90 tablet 3  . Multiple Vitamins-Minerals (MULTIVITAMIN WITH MINERALS) tablet Take 1 tablet by mouth daily.    . NON FORMULARY Calcium 250 mg+ Magnesium 250 mg + Potassium 45 mg Takes 2 tablets  daily.    . Omega-3 Fatty Acids (FISH OIL) 1000 MG CAPS Take 1 capsule by mouth daily.    Marland Kitchen OVER THE COUNTER MEDICATION Take 2 tablets by mouth daily. Focus Formula    . oxyCODONE (ROXICODONE) 15 MG immediate release tablet Take 15 mg by mouth every 4 (four) hours as needed for pain.    Edythe Lynn Lecithin 1200 MG CAPS Take 1 tablet by mouth daily.      No current facility-administered medications on file prior to visit.    No Known Allergies  Past Medical History  Diagnosis Date  . Hypertension   . Arthritis   . Lumbar stenosis   . Diabetes mellitus without complication ([place])   . Nocturia   . Confusion   . Wears glasses   . Memory loss   . Numbness and tingling     legs and feet  . Hepatitis     Past Surgical History  Procedure Laterality Date  . Cesarean section  1978  . Hemorrhoid surgery  2015  . Colonoscopy    . Transforaminal lumbar interbody fusion (tlif) with pedicle screw fixation 2 level N/A 11/07/2015    Procedure: Lumbar three-four Lumbar four-five  transforaminal lumbar interbody fusion with interbody prosthesis posterior lateral arthrodesis and posterior segmental instrumentation;  Surgeon: Newman Pies, MD;  Location: [facility];  Service: Neurosurgery;  Laterality: N/A;  Family History  Problem Relation Age of Onset  . Hypertension Other   . Cancer Other     ovarian  . Cancer Mother   . Heart disease Father     Social History   Social History  . Marital Status: Divorced    Spouse Name: N/A  . Number of Children: 4  . Years of Education: N/A   Occupational History  . Not on file.   Social History Main Topics  . Smoking status: Never Smoker   . Smokeless tobacco: Never Used  . Alcohol Use: No     Comment: rarely  . Drug Use: No  . Sexual Activity: Not on file   Other Topics Concern  . Not on file   Social History Narrative   Has a living will.   Son is health care POA   Would desire CPR.  Would not want prolonged life support if  futile.   Probably would not want tube feeds if cognitively unaware   Review of Systems Sleeps okay Appetite still not great Weight down 4#    Objective:   Physical Exam  Constitutional: She appears well-developed and well-nourished. No distress.  Musculoskeletal:  No right wrist or hand swelling or tenderness  Normal active ROM  Neurological:  Slight decreased grip strength on right No specific finger weakness          Assessment & Plan:

## 2015-12-13 NOTE — Assessment & Plan Note (Signed)
Mild Various nerve distributions Discussed possible mechanical stress--has been using cane in right hand but thinks the problems started before then Observe for now Continue to wean meds Consider neurologist if symptoms progress or fail to clear

## 2016-01-07 ENCOUNTER — Telehealth: Payer: Self-pay

## 2016-01-07 NOTE — Telephone Encounter (Signed)
Spoke to patient. She has already made a call to her surgeon and requested it today, but they had not called her back so she thought she would call us. She thought we were all connected. I advised her to get it through her back surgeon. If for some reason they do not do it, let us know.

## 2016-01-07 NOTE — Telephone Encounter (Signed)
Because of her back??  Did not drop off form? Did she review this with the back surgeon?  I can probably approve a 3 month placard---print one for me to sign

## 2016-01-07 NOTE — Telephone Encounter (Signed)
Left message to call back. I have blank forms at my desk.

## 2016-01-07 NOTE — Telephone Encounter (Signed)
Patient returned Shannon's call.  Patient said it's for her back.  Patient can be reached at [phone].

## 2016-01-07 NOTE — Telephone Encounter (Signed)
Pt could not remember who was filling oxycodone; per med list appears Dr Arnoldo Morale has; pt will ck with Dr Arnoldo Morale.

## 2016-01-07 NOTE — Telephone Encounter (Signed)
Pt left v/m requesting handicap placard; pt request cb when form ready for pick up.

## 2016-01-15 ENCOUNTER — Ambulatory Visit (INDEPENDENT_AMBULATORY_CARE_PROVIDER_SITE_OTHER): Payer: Commercial Managed Care - HMO | Admitting: Internal Medicine

## 2016-01-15 ENCOUNTER — Encounter: Payer: Self-pay | Admitting: Internal Medicine

## 2016-01-15 VITALS — BP 114/84 | HR 68 | Temp 98.2°F | Ht 61.5 in | Wt 198.0 lb

## 2016-01-15 DIAGNOSIS — I10 Essential (primary) hypertension: Secondary | ICD-10-CM

## 2016-01-15 DIAGNOSIS — Z0000 Encounter for general adult medical examination without abnormal findings: Secondary | ICD-10-CM | POA: Diagnosis not present

## 2016-01-15 DIAGNOSIS — G629 Polyneuropathy, unspecified: Secondary | ICD-10-CM | POA: Diagnosis not present

## 2016-01-15 DIAGNOSIS — Z7189 Other specified counseling: Secondary | ICD-10-CM | POA: Insufficient documentation

## 2016-01-15 DIAGNOSIS — F39 Unspecified mood [affective] disorder: Secondary | ICD-10-CM | POA: Diagnosis not present

## 2016-01-15 DIAGNOSIS — M549 Dorsalgia, unspecified: Secondary | ICD-10-CM

## 2016-01-15 DIAGNOSIS — G8929 Other chronic pain: Secondary | ICD-10-CM

## 2016-01-15 DIAGNOSIS — E1140 Type 2 diabetes mellitus with diabetic neuropathy, unspecified: Secondary | ICD-10-CM | POA: Diagnosis not present

## 2016-01-15 LAB — HM DIABETES FOOT EXAM

## 2016-01-15 NOTE — Assessment & Plan Note (Signed)
BP Readings from Last 3 Encounters:  01/15/16 114/84  12/13/15 130/80  12/04/15 126/80   Good control

## 2016-01-15 NOTE — Assessment & Plan Note (Signed)
Dr Arnoldo Morale is managing still

## 2016-01-15 NOTE — Assessment & Plan Note (Signed)
I have personally reviewed the Medicare Annual Wellness questionnaire and have noted 1. The patient's medical and social history 2. Their use of alcohol, tobacco or illicit drugs 3. Their current medications and supplements 4. The patient's functional ability including ADL's, fall risks, home safety risks and hearing or visual             impairment. 5. Diet and physical activities 6. Evidence for depression or mood disorders  The patients weight, height, BMI and visual acuity have been recorded in the chart I have made referrals, counseling and provided education to the patient based review of the above and I have provided the pt with a written personalized care plan for preventive services.  I have provided you with a copy of your personalized plan for preventive services. Please take the time to review along with your updated medication list.  Colon due 2021 Doesn't want mammogram UTD on immunizations--yearly flu

## 2016-01-15 NOTE — Assessment & Plan Note (Signed)
See social history 

## 2016-01-15 NOTE — Assessment & Plan Note (Signed)
Chronic dysthymia and some anxiety Largely reactive to chronic pain now Not MDD Will hold off on meds

## 2016-01-15 NOTE — Assessment & Plan Note (Signed)
Probably mixed etiology--diabetes, back etc

## 2016-01-15 NOTE — Progress Notes (Signed)
Subjective:    Patient ID: [name], female    DOB: 21-Jan-1946, 70 y.o.   MRN: [id number]  HPI Here for initial Medicare wellness visit and follow up of chronic medical conditions Reviewed form and advanced directives Reviewed other doctors No alcohol or tobacco Vision okay with glasses. Mild hearing loss Not able to exercise No falls Ongoing mood issues Independent with instrumental ADLs Still concerned about her memory--has some trouble with complex tasks, like doing the fingerstick  Back feels like before the surgery No real change--though she has been able to reduce pain meds Still on the oxycodone for pain--will notice bad tightness in her back at night--helps her sleep Also needs them in day--but Dr Arnoldo Morale has lowered the dose Not taking the gabapentin or the muscle relaxer Notices some change in bowels--- urgency and even didn't make it to the commode once (while in shower) Sensory changes in feet are about the same  Had meeting with nutritional counselor for the diabetes Hasn't gone back for follow up  Trying to make adjustments with her eating No trouble with metformin--- discussed that this can cause the bowel changes Fasting sugars now 110-120  Mood still not great Occasional hopeless feelings but not daily Much of this is related to the back pain Sleeps a lot Not anhedonic--but not doing a lot  No chest pain Rare brief palpitations No dizziness or syncope No SOB No edema  Current Outpatient Prescriptions on File Prior to Visit  Medication Sig Dispense Refill  . Ascorbic Acid (VITAMIN C WITH ROSE HIPS) 1000 MG tablet Take 1,000 mg by mouth daily.    Marland Kitchen BAYER MICROLET LANCETS lancets Use as instructed to test blood sugar once daily dx: R73.03 100 each 1  . Blood Glucose Monitoring Suppl (ACCU-CHEK AVIVA PLUS) w/Device KIT Use to test blood sugar once daily Dx: R73.03 1 kit 0  . docusate sodium (COLACE) 100 MG capsule Take 1 capsule (100 mg total) by  mouth 2 (two) times daily. 60 capsule 0  . gabapentin (NEURONTIN) 800 MG tablet Take 800 mg by mouth 4 (four) times daily as needed.     Marland Kitchen glucose blood (ACCU-CHEK AVIVA PLUS) test strip Use to test blood sugar once daily Dx: R73.03 100 each 1  . lisinopril-hydrochlorothiazide (PRINZIDE,ZESTORETIC) 20-25 MG tablet TAKE 2 TABLETS BY MOUTH DIALY 180 tablet 1  . metFORMIN (GLUCOPHAGE XR) 500 MG 24 hr tablet Take 1 tablet (500 mg total) by mouth daily with breakfast. 90 tablet 3  . Multiple Vitamins-Minerals (MULTIVITAMIN WITH MINERALS) tablet Take 1 tablet by mouth daily.    . NON FORMULARY Calcium 250 mg+ Magnesium 250 mg + Potassium 45 mg Takes 2 tablets daily.    . Omega-3 Fatty Acids (FISH OIL) 1000 MG CAPS Take 1 capsule by mouth daily.    Marland Kitchen OVER THE COUNTER MEDICATION Take 2 tablets by mouth daily. Focus Formula    . Soya Lecithin 1200 MG CAPS Take 1 tablet by mouth daily.      No current facility-administered medications on file prior to visit.    No Known Allergies  Past Medical History  Diagnosis Date  . Hypertension   . Arthritis   . Lumbar stenosis   . Diabetes mellitus without complication ([place])   . Nocturia   . Confusion   . Wears glasses   . Memory loss   . Numbness and tingling     legs and feet  . Hepatitis     Past Surgical History  Procedure Laterality Date  . Cesarean section  1978  . Hemorrhoid surgery  2015  . Colonoscopy    . Transforaminal lumbar interbody fusion (tlif) with pedicle screw fixation 2 level N/A 11/07/2015    Procedure: Lumbar three-four Lumbar four-five  transforaminal lumbar interbody fusion with interbody prosthesis posterior lateral arthrodesis and posterior segmental instrumentation;  Surgeon: Newman Pies, MD;  Location: [facility];  Service: Neurosurgery;  Laterality: N/A;    Family History  Problem Relation Age of Onset  . Hypertension Other   . Cancer Other     ovarian  . Cancer Mother   . Heart disease Father     Social  History   Social History  . Marital Status: Divorced    Spouse Name: N/A  . Number of Children: 4  . Years of Education: N/A   Occupational History  . Not on file.   Social History Main Topics  . Smoking status: Never Smoker   . Smokeless tobacco: Never Used  . Alcohol Use: No     Comment: rarely  . Drug Use: No  . Sexual Activity: Not on file   Other Topics Concern  . Not on file   Social History Narrative   Has a living will.   Son is health care POA   Would accept resuscitation.  Would not want prolonged life support if futile.   Probably would not want tube feeds if cognitively unaware   Review of Systems Appetite is off. Weight is down a few pounds Sleeps okay at night--but naps due to boredom (or while reading) Wears seat belt Teeth are okay--overdue for dentist No blood in stool Voiding okay--no blood No suspicious lesions (small hemangioma on right arm) Some hot flashes again    Objective:   Physical Exam  Constitutional: She is oriented to person, place, and time. She appears well-developed and well-nourished. No distress.  HENT:  Mouth/Throat: Oropharynx is clear and moist. No oropharyngeal exudate.  Neck: Normal range of motion. No thyromegaly present.  Cardiovascular: Normal rate, regular rhythm, normal heart sounds and intact distal pulses.  Exam reveals no gallop.   No murmur heard. Pulmonary/Chest: Effort normal and breath sounds normal. No respiratory distress. She has no wheezes. She has no rales.  Abdominal: Soft. There is no tenderness.  Musculoskeletal: She exhibits no edema.  Lymphadenopathy:    She has no cervical adenopathy.  Neurological: She is alert and oriented to person, place, and time.  President-- "[place]?, [place]?" [phone] [id number] Recall 2/3  Vague sensory changes in feet  Skin: No rash noted. No erythema.  Psychiatric: Her behavior is normal.  Tearful and emotional          Assessment &  Plan:

## 2016-01-15 NOTE — Progress Notes (Signed)
Pre visit review using our clinic review tool, if applicable. No additional management support is needed unless otherwise documented below in the visit note. 

## 2016-01-15 NOTE — Assessment & Plan Note (Signed)
New diagnosis Went for 1 counseling session On metformin Will recheck labs at next visit

## 2016-02-19 ENCOUNTER — Telehealth: Payer: Self-pay | Admitting: Internal Medicine

## 2016-02-19 NOTE — Telephone Encounter (Signed)
Keep clean with soap and water I will see her then

## 2016-02-19 NOTE — Telephone Encounter (Signed)
Pt has appt with Dr Glori Bickers on 02/20/16 at 2 PM. Pt has reddened area about the size of a dime at top of leg where tick was.

## 2016-02-19 NOTE — Telephone Encounter (Signed)
[facility]  Patient Name: [name]  DOB: 1946/05/27    Initial Comment Caller states she was bit by a tick, and now she has a red spot on her body near her groin about the size of a dime.    Nurse Assessment      Guidelines    Guideline Title Affirmed Question Affirmed Notes       Final Disposition User   FINAL ATTEMPT MADE - no message left Harlow Mares, Therapist, sports, Suanne Marker

## 2016-02-19 NOTE — Telephone Encounter (Signed)
Patient advised.

## 2016-02-20 ENCOUNTER — Encounter: Payer: Self-pay | Admitting: Family Medicine

## 2016-02-20 ENCOUNTER — Ambulatory Visit (INDEPENDENT_AMBULATORY_CARE_PROVIDER_SITE_OTHER): Payer: Commercial Managed Care - HMO | Admitting: Family Medicine

## 2016-02-20 VITALS — BP 122/64 | HR 74 | Temp 97.3°F | Ht 61.5 in | Wt 197.5 lb

## 2016-02-20 DIAGNOSIS — W57XXXA Bitten or stung by nonvenomous insect and other nonvenomous arthropods, initial encounter: Secondary | ICD-10-CM | POA: Diagnosis not present

## 2016-02-20 DIAGNOSIS — S30861A Insect bite (nonvenomous) of abdominal wall, initial encounter: Secondary | ICD-10-CM | POA: Insufficient documentation

## 2016-02-20 MED ORDER — DOXYCYCLINE HYCLATE 100 MG PO TABS: 100.0000 mg | ORAL_TABLET | Freq: Two times a day (BID) | ORAL | Status: DC

## 2016-02-20 NOTE — Progress Notes (Signed)
Pre visit review using our clinic review tool, if applicable. No additional management support is needed unless otherwise documented below in the visit note. 

## 2016-02-20 NOTE — Progress Notes (Signed)
Subjective:    Patient ID: [name], female    DOB: [birth year], 70 y.o.   MRN: [id number]  HPI Here with a tick bite   Found it about 4 days ago on R upper thigh  Was not engorged  She was able to get the tick off- had bitten her and was already off   Was not a tiny tick- regular size   No fever  No sore throat or headache  No new rashes   Patient Active Problem List   Diagnosis Date Noted  . Tick bite of groin 02/20/2016  . Advance directive discussed with patient 01/15/2016  . Spondylolisthesis of lumbar region 11/07/2015  . Chronic back pain 03/29/2015  . Preventative health care 01/10/2015  . Osteoarthritis, hip, bilateral 11/28/2014  . Peripheral neuropathy ([place]) 11/28/2014  . Type 2 diabetes, controlled, with neuropathy ([place]) 01/20/2014  . Episodic mood disorder ([place]) 01/20/2014  . Memory loss 12/26/2013  . Bleeding hemorrhoid 11/07/2013  . IBS (irritable bowel syndrome) 12/27/2012  . Essential hypertension, benign 08/06/2010   Past Medical History  Diagnosis Date  . Hypertension   . Arthritis   . Lumbar stenosis   . Diabetes mellitus without complication ([place])   . Nocturia   . Confusion   . Wears glasses   . Memory loss   . Numbness and tingling     legs and feet  . Hepatitis    Past Surgical History  Procedure Laterality Date  . Cesarean section  1978  . Hemorrhoid surgery  2015  . Colonoscopy    . Transforaminal lumbar interbody fusion (tlif) with pedicle screw fixation 2 level N/A 11/07/2015    Procedure: Lumbar three-four Lumbar four-five  transforaminal lumbar interbody fusion with interbody prosthesis posterior lateral arthrodesis and posterior segmental instrumentation;  Surgeon: Newman Pies, MD;  Location: [facility];  Service: Neurosurgery;  Laterality: N/A;   Social History  Substance Use Topics  . Smoking status: Never Smoker   . Smokeless tobacco: Never Used  . Alcohol Use: No     Comment: rarely   Family History  Problem  Relation Age of Onset  . Hypertension Other   . Cancer Other     ovarian  . Cancer Mother   . Heart disease Father    No Known Allergies Current Outpatient Prescriptions on File Prior to Visit  Medication Sig Dispense Refill  . Ascorbic Acid (VITAMIN C WITH ROSE HIPS) 1000 MG tablet Take 1,000 mg by mouth daily.    Marland Kitchen BAYER MICROLET LANCETS lancets Use as instructed to test blood sugar once daily dx: R73.03 100 each 1  . Blood Glucose Monitoring Suppl (ACCU-CHEK AVIVA PLUS) w/Device KIT Use to test blood sugar once daily Dx: R73.03 1 kit 0  . docusate sodium (COLACE) 100 MG capsule Take 1 capsule (100 mg total) by mouth 2 (two) times daily. 60 capsule 0  . glucose blood (ACCU-CHEK AVIVA PLUS) test strip Use to test blood sugar once daily Dx: R73.03 100 each 1  . lisinopril-hydrochlorothiazide (PRINZIDE,ZESTORETIC) 20-25 MG tablet TAKE 2 TABLETS BY MOUTH DIALY 180 tablet 1  . metFORMIN (GLUCOPHAGE XR) 500 MG 24 hr tablet Take 1 tablet (500 mg total) by mouth daily with breakfast. 90 tablet 3  . Multiple Vitamins-Minerals (MULTIVITAMIN WITH MINERALS) tablet Take 2 tablets by mouth daily.     . NON FORMULARY Calcium 250 mg+ Magnesium 250 mg + Potassium 45 mg Takes 2 tablets daily.    . Omega-3 Fatty Acids (FISH  OIL) 1000 MG CAPS Take 1 capsule by mouth daily.    Marland Kitchen OVER THE COUNTER MEDICATION Take 2 tablets by mouth daily. Focus Formula    . Oxycodone HCl 10 MG TABS Take 1 tablet by mouth 4 (four) times daily as needed.  0  . Soya Lecithin 1200 MG CAPS Take 1 tablet by mouth daily.      No current facility-administered medications on file prior to visit.      Review of Systems Review of Systems  Constitutional: Negative for fever, appetite change, fatigue and unexpected weight change.  Eyes: Negative for pain and visual disturbance.  Respiratory: Negative for cough and shortness of breath.   Cardiovascular: Negative for cp or palpitations    Gastrointestinal: Negative for nausea,  diarrhea and constipation.  Genitourinary: Negative for urgency and frequency.  Skin: Negative for pallor or rash  pos for tick bite site which is itchy Neurological: Negative for weakness, light-headedness, numbness and headaches.  Hematological: Negative for adenopathy. Does not bruise/bleed easily.  Psychiatric/Behavioral: Negative for dysphoric mood. The patient is not nervous/anxious.         Objective:   Physical Exam  Constitutional: She appears well-developed and well-nourished. No distress.  overwt and well app  Eyes: Conjunctivae and EOM are normal. Pupils are equal, round, and reactive to light.  Neck: Normal range of motion. Neck supple.  Cardiovascular: Normal rate, regular rhythm and normal heart sounds.   Pulmonary/Chest: Effort normal and breath sounds normal. No respiratory distress. She has no wheezes. She has no rales.  Musculoskeletal: She exhibits no edema or tenderness.  No acute joint changes   Lymphadenopathy:    She has no cervical adenopathy.  Neurological: She is alert.  Skin: Skin is warm and dry. No rash noted. No pallor.  1 cm tick bite site on R upper thigh- erythematous with very slt central clearing and no scale or retained tick parts  No excoriation  No rashes           Assessment & Plan:   Problem List Items Addressed This Visit      Musculoskeletal and Integument   Tick bite of groin - Primary    Right upper thigh- has a red/ almost bruised appearing area with no remaining tick and slt central clearing  Shape is a bit concerning but not a classic bullseye appearance  No other symptoms  Disc keeping it clean and dry/ using hydrocortisone for itch  If bite size enlarges or any rash or other symptoms -will fill px for doxycycline

## 2016-02-20 NOTE — Patient Instructions (Signed)
Keep tick bite clean and dry  Over the counter hydrocortisone cream can help itch if needed  If symptoms worsen or if you develop fever or headache or other symptoms - fill the px for doxycycline and take it as directed and let us know

## 2016-02-24 NOTE — Assessment & Plan Note (Signed)
Right upper thigh- has a red/ almost bruised appearing area with no remaining tick and slt central clearing  Shape is a bit concerning but not a classic bullseye appearance  No other symptoms  Disc keeping it clean and dry/ using hydrocortisone for itch  If bite size enlarges or any rash or other symptoms -will fill px for doxycycline

## 2016-03-04 DIAGNOSIS — Z6833 Body mass index (BMI) 33.0-33.9, adult: Secondary | ICD-10-CM | POA: Diagnosis not present

## 2016-03-04 DIAGNOSIS — M4316 Spondylolisthesis, lumbar region: Secondary | ICD-10-CM | POA: Diagnosis not present

## 2016-03-31 ENCOUNTER — Encounter: Payer: Self-pay | Admitting: Internal Medicine

## 2016-03-31 ENCOUNTER — Ambulatory Visit (INDEPENDENT_AMBULATORY_CARE_PROVIDER_SITE_OTHER): Payer: Commercial Managed Care - HMO | Admitting: Internal Medicine

## 2016-03-31 VITALS — BP 122/72 | HR 81 | Temp 98.3°F | Wt 201.0 lb

## 2016-03-31 DIAGNOSIS — R600 Localized edema: Secondary | ICD-10-CM

## 2016-03-31 DIAGNOSIS — E1142 Type 2 diabetes mellitus with diabetic polyneuropathy: Secondary | ICD-10-CM

## 2016-03-31 LAB — COMPREHENSIVE METABOLIC PANEL
ALBUMIN: 4.1 g/dL (ref 3.5–5.2)
ALK PHOS: 47 U/L (ref 39–117)
ALT: 18 U/L (ref 0–35)
AST: 20 U/L (ref 0–37)
BILIRUBIN TOTAL: 0.4 mg/dL (ref 0.2–1.2)
BUN: 20 mg/dL (ref 6–23)
CO2: 30 mEq/L (ref 19–32)
CREATININE: 1.03 mg/dL (ref 0.40–1.20)
Calcium: 9.6 mg/dL (ref 8.4–10.5)
Chloride: 102 mEq/L (ref 96–112)
GFR: 56.65 mL/min — ABNORMAL LOW (ref >60.00)
Glucose, Bld: 151 mg/dL — ABNORMAL HIGH (ref 70–99)
Potassium: 3.8 mEq/L (ref 3.5–5.1)
SODIUM: 140 meq/L (ref 135–145)
TOTAL PROTEIN: 6.8 g/dL (ref 6.0–8.3)

## 2016-03-31 LAB — BRAIN NATRIURETIC PEPTIDE: PRO B NATRI PEPTIDE: 256 pg/mL — AB (ref 0.0–100.0)

## 2016-03-31 NOTE — Progress Notes (Signed)
Subjective:    Patient ID: [name], female    DOB: 03-18-1946, 70 y.o.   MRN: [id number]  HPI  Pt presents to the clinic today with a complaint of leg and feet swelling x 7 days. She reports tingling in both lower extremities and pain described as constant aching with radiation to her thighs and hips.  She notes the pain worsens with weightbearing to a severity of 10/10 and improves with sitting. She has tried pain medications with some relief but symptoms persist. She has never been told that she has heart failure. She denies chest pain or shortness of breath. She is taking Lisinopril-HCT and reports she is taking it daily. She stopped her Gabapentin for a while, but recently restarted in at 300 mg 6 x day.  Review of Systems   Past Medical History:  Diagnosis Date  . Arthritis   . Confusion   . Diabetes mellitus without complication ([place])   . Hepatitis   . Hypertension   . Lumbar stenosis   . Memory loss   . Nocturia   . Numbness and tingling    legs and feet  . Wears glasses     Current Outpatient Prescriptions  Medication Sig Dispense Refill  . Ascorbic Acid (VITAMIN C WITH ROSE HIPS) 1000 MG tablet Take 1,000 mg by mouth daily.    Marland Kitchen BAYER MICROLET LANCETS lancets Use as instructed to test blood sugar once daily dx: R73.03 100 each 1  . Blood Glucose Monitoring Suppl (ACCU-CHEK AVIVA PLUS) w/Device KIT Use to test blood sugar once daily Dx: R73.03 1 kit 0  . docusate sodium (COLACE) 100 MG capsule Take 1 capsule (100 mg total) by mouth 2 (two) times daily. 60 capsule 0  . gabapentin (NEURONTIN) 300 MG capsule Take 300 mg by mouth every 4 (four) hours.    Marland Kitchen glucose blood (ACCU-CHEK AVIVA PLUS) test strip Use to test blood sugar once daily Dx: R73.03 100 each 1  . lisinopril-hydrochlorothiazide (PRINZIDE,ZESTORETIC) 20-25 MG tablet TAKE 2 TABLETS BY MOUTH DIALY 180 tablet 1  . metFORMIN (GLUCOPHAGE XR) 500 MG 24 hr tablet Take 1 tablet (500 mg total) by mouth daily with  breakfast. 90 tablet 3  . Multiple Vitamins-Minerals (MULTIVITAMIN WITH MINERALS) tablet Take 2 tablets by mouth daily.     . NON FORMULARY Calcium 250 mg+ Magnesium 250 mg + Potassium 45 mg Takes 2 tablets daily.    . Omega-3 Fatty Acids (FISH OIL) 1000 MG CAPS Take 1 capsule by mouth daily.    Marland Kitchen OVER THE COUNTER MEDICATION Take 2 tablets by mouth daily. Focus Formula    . Oxycodone HCl 10 MG TABS Take 1 tablet by mouth 4 (four) times daily as needed.  0  . Soya Lecithin 1200 MG CAPS Take 1 tablet by mouth daily.      No current facility-administered medications for this visit.     No Known Allergies  Family History  Problem Relation Age of Onset  . Hypertension Other   . Cancer Other     ovarian  . Cancer Mother   . Heart disease Father     Social History   Social History  . Marital status: Divorced    Spouse name: N/A  . Number of children: 4  . Years of education: N/A   Occupational History  . Not on file.   Social History Main Topics  . Smoking status: Never Smoker  . Smokeless tobacco: Never Used  . Alcohol use No  Comment: rarely  . Drug use: No  . Sexual activity: Not on file   Other Topics Concern  . Not on file   Social History Narrative   Has a living will.   Son is health care POA   Would accept resuscitation.  Would not want prolonged life support if futile.   Probably would not want tube feeds if cognitively unaware     Ext: Admits to swelling, pain, and tingling of bilateral lower extremities.   No other specific complaints in a complete review of systems (except as listed in HPI above).     Objective:   Physical Exam BP 122/72   Pulse 81   Temp 98.3 F (36.8 C) (Oral)   Wt 201 lb (91.2 kg)   SpO2 97%   BMI 37.36 kg/m   General: Appears tearful, in no acute distress. Pulm: Clear to auscultation bilaterally.  No wheezes, rales, or rhonchi. CV: Regular rate and rhythm.  No murmurs, rubs, or gallops. Ext: 2+ pitting edema  bilateral lower extremities.  Peripheral pulses 2+     Assessment & Plan:   Peripheral edema bilateral lower extremities:  Will check BNP, CMP today If results demonstrate heart involvement, will initiate treatment with Lasix Compression stockings if lab results normal Encouraged elevation Low salt diet  Peripheral Neuropathy secondary to DM 2:  Discussed importance of tight BS control Continue Metformin and Neurontin  Will follow up after labs, RTC as needed Webb Silversmith, NP

## 2016-03-31 NOTE — Patient Instructions (Signed)
Neuropathic Pain Neuropathic pain is pain caused by damage to the nerves that are responsible for certain sensations in your body (sensory nerves). The pain can be caused by damage to:   The sensory nerves that send signals to your spinal cord and brain (peripheral nervous system).  The sensory nerves in your brain or spinal cord (central nervous system). Neuropathic pain can make you more sensitive to pain. What would be a minor sensation for most people may feel very painful if you have neuropathic pain. This is usually a long-term condition that can be difficult to treat. The type of pain can differ from person to person. It may start suddenly (acute), or it may develop slowly and last for a long time (chronic). Neuropathic pain may come and go as damaged nerves heal or may stay at the same level for years. It often causes emotional distress, loss of sleep, and a lower quality of life. CAUSES  The most common cause of damage to a sensory nerve is diabetes. Many other diseases and conditions can also cause neuropathic pain. Causes of neuropathic pain can be classified as:  Toxic. Many drugs and chemicals can cause toxic damage. The most common cause of toxic neuropathic pain is damage from drug treatment for cancer (chemotherapy).  Metabolic. This type of pain can happen when a disease causes imbalances that damage nerves. Diabetes is the most common of these diseases. Vitamin B deficiency caused by long-term alcohol abuse is another common cause.  Traumatic. Any injury that cuts, crushes, or stretches a nerve can cause damage and pain. A common example is feeling pain after losing an arm or leg (phantom limb pain).  Compression-related. If a sensory nerve gets trapped or compressed for a long period of time, the blood supply to the nerve can be cut off.  Vascular. Many blood vessel diseases can cause neuropathic pain by decreasing blood supply and oxygen to nerves.  Autoimmune. This type of  pain results from diseases in which the body's defense system mistakenly attacks sensory nerves. Examples of autoimmune diseases that can cause neuropathic pain include lupus and multiple sclerosis.  Infectious. Many types of viral infections can damage sensory nerves and cause pain. Shingles infection is a common cause of this type of pain.  Inherited. Neuropathic pain can be a symptom of many diseases that are passed down through families (genetic). SIGNS AND SYMPTOMS  The main symptom is pain. Neuropathic pain is often described as:  Burning.  Shock-like.  Stinging.  Hot or cold.  Itching. DIAGNOSIS  No single test can diagnose neuropathic pain. Your health care provider will do a physical exam and ask you about your pain. You may use a pain scale to describe how bad your pain is. You may also have tests to see if you have a high sensitivity to pain and to help find the cause and location of any sensory nerve damage. These tests may include:  Imaging studies, such as:  X-rays.  CT scan.  MRI.  Nerve conduction studies to test how well nerve signals travel through your sensory nerves (electrodiagnostic testing).  Stimulating your sensory nerves through electrodes on your skin and measuring the response in your spinal cord and brain (somatosensory evoked potentials). TREATMENT  Treatment for neuropathic pain may change over time. You may need to try different treatment options or a combination of treatments. Some options include:  Over-the-counter pain relievers.  Prescription medicines. Some medicines used to treat other conditions may also help neuropathic pain. These   include medicines to:  Control seizures (anticonvulsants).  Relieve depression (antidepressants).  Prescription-strength pain relievers (narcotics). These are usually used when other pain relievers do not help.  Transcutaneous nerve stimulation (TENS). This uses electrical currents to block painful nerve  signals. The treatment is painless.  Topical and local anesthetics. These are medicines that numb the nerves. They can be injected as a nerve block or applied to the skin.  Alternative treatments, such as:  Acupuncture.  Meditation.  Massage.  Physical therapy.  Pain management programs.  Counseling. HOME CARE INSTRUCTIONS  Learn as much as you can about your condition.  Take medicines only as directed by your health care provider.  Work closely with all your health care providers to find what works best for you.  Have a good support system at home.  Consider joining a chronic pain support group. SEEK MEDICAL CARE IF:  Your pain treatments are not helping.  You are having side effects from your medicines.  You are struggling with fatigue, mood changes, depression, or anxiety.   This information is not intended to replace advice given to you by your health care provider. Make sure you discuss any questions you have with your health care provider.   Document Released: 05/22/2004 Document Revised: 09/15/2014 Document Reviewed: 02/02/2014 Elsevier Interactive Patient Education 2016 Elsevier Inc.  

## 2016-03-31 NOTE — Progress Notes (Signed)
Pre visit review using our clinic review tool, if applicable. No additional management support is needed unless otherwise documented below in the visit note. 

## 2016-04-01 ENCOUNTER — Other Ambulatory Visit: Payer: Self-pay | Admitting: Internal Medicine

## 2016-04-01 DIAGNOSIS — R7989 Other specified abnormal findings of blood chemistry: Secondary | ICD-10-CM

## 2016-04-01 DIAGNOSIS — R609 Edema, unspecified: Secondary | ICD-10-CM

## 2016-04-02 ENCOUNTER — Telehealth: Payer: Self-pay

## 2016-04-02 ENCOUNTER — Other Ambulatory Visit: Payer: Self-pay | Admitting: Internal Medicine

## 2016-04-02 MED ORDER — FUROSEMIDE 20 MG PO TABS: ORAL_TABLET | ORAL | 0 refills | Status: DC

## 2016-04-02 NOTE — Telephone Encounter (Signed)
PLEASE NOTE: All timestamps contained within this report are represented as [place] Standard Time. CONFIDENTIALTY NOTICE: This fax transmission is intended only for the addressee. It contains information that is legally privileged, confidential or otherwise protected from use or disclosure. If you are not the intended recipient, you are strictly prohibited from reviewing, disclosing, copying using or disseminating any of this information or taking any action in reliance on or regarding this information. If you have received this fax in error, please notify us immediately by telephone so that we can arrange for its return to [place]. Phone: [phone], Toll-Free: [phone], Fax: [phone] Page: 1 of 1 Call Id: [id number] [place] Patient Name: [name] Gender: Female DOB: 1945-12-07 Age: 23 Y 10 M 29 D Return Phone Number: [phone] (Primary), [phone] (Secondary) Address[place] [address] Client [place] [facility] - Client Client Site [facility] Physician Webb Silversmith - NP Contact Type Call Who Is Calling Patient / Member / Family / Caregiver Call Type Triage / Clinical Relationship To Patient Self Return Phone Number [phone] (Primary) Chief Complaint Prescription Refill or Medication Request (non symptomatic) Reason for Call Symptomatic / Request for [place] states, Dr. prescribed her an rx. lasix and the dr. did not order it for her. Drug store doesn't have the order. Feet swelling. Translation No Nurse Assessment Nurse: Georgina Peer, RN, Kendrick Fries Date/Time (Eastern Time): 04/01/2016 6:39:51 PM Confirm and document reason for call. If symptomatic, describe symptoms. You must click the next button to save text entered. ---Caller states, she saw the NP yesterday & she  prescribed Lasix . Her Drug store doesn't have the order. Both feet & ankles are swelling, but not any worse than yesterday when she was in the office. She just wants her Lasix called in. She asked for "something over the counter" that she could take, but I did not offer any suggestions for that. I recommended she call the office when they open in the morning to remind them that her script had not made it to the pharmacy( d/t directives of no meds after hours). She agreed that she would . Has the patient traveled out of the country within the last 30 days? ---No Does the patient have any new or worsening symptoms? ---No Guidelines Guideline Title Affirmed Question Affirmed Notes Nurse Date/Time (Eastern Time) Disp. Time Eilene Ghazi Time) Disposition Final User 04/01/2016 6:51:35 PM Clinical Call Yes Georgina Peer, RN, Kendrick Fries

## 2016-04-07 ENCOUNTER — Encounter: Payer: Commercial Managed Care - HMO | Attending: Internal Medicine | Admitting: Dietician

## 2016-04-07 VITALS — Ht 64.0 in | Wt 194.5 lb

## 2016-04-07 DIAGNOSIS — E119 Type 2 diabetes mellitus without complications: Secondary | ICD-10-CM | POA: Diagnosis not present

## 2016-04-08 ENCOUNTER — Encounter: Payer: Self-pay | Admitting: Dietician

## 2016-04-08 NOTE — Progress Notes (Signed)

## 2016-04-11 ENCOUNTER — Telehealth: Payer: Self-pay | Admitting: Internal Medicine

## 2016-04-11 DIAGNOSIS — E1140 Type 2 diabetes mellitus with diabetic neuropathy, unspecified: Secondary | ICD-10-CM

## 2016-04-11 NOTE — Telephone Encounter (Signed)
I left a message on the number provided to call back. I am not sure if that is her number. A man was on the VM. I called and left a message on the number in her chart, also.

## 2016-04-11 NOTE — Telephone Encounter (Signed)
Spoke to pt. Made lab appt. 

## 2016-04-11 NOTE — Telephone Encounter (Signed)
Pt would like an A1c. Her last one was in Feb and pt states she has changed her diet and lost weight.  Pt request cb  Thanks

## 2016-04-11 NOTE — Telephone Encounter (Signed)
I was going to check it at her November follow up but we can check it sooner. Make sure she realizes it is a 2-3 month average and may not reflect only more recently. I did put in order so you can schedule her into the lab

## 2016-04-14 ENCOUNTER — Other Ambulatory Visit: Payer: Self-pay

## 2016-04-14 ENCOUNTER — Encounter: Payer: Commercial Managed Care - HMO | Attending: Internal Medicine | Admitting: Dietician

## 2016-04-14 VITALS — Wt 193.2 lb

## 2016-04-14 DIAGNOSIS — E119 Type 2 diabetes mellitus without complications: Secondary | ICD-10-CM

## 2016-04-14 NOTE — Progress Notes (Signed)

## 2016-04-15 ENCOUNTER — Other Ambulatory Visit (INDEPENDENT_AMBULATORY_CARE_PROVIDER_SITE_OTHER): Payer: Commercial Managed Care - HMO

## 2016-04-15 DIAGNOSIS — E1140 Type 2 diabetes mellitus with diabetic neuropathy, unspecified: Secondary | ICD-10-CM | POA: Diagnosis not present

## 2016-04-15 LAB — HEMOGLOBIN A1C: HEMOGLOBIN A1C: 5.9 % (ref 4.6–6.5)

## 2016-04-16 ENCOUNTER — Other Ambulatory Visit: Payer: Self-pay

## 2016-04-16 ENCOUNTER — Ambulatory Visit (INDEPENDENT_AMBULATORY_CARE_PROVIDER_SITE_OTHER): Payer: Commercial Managed Care - HMO

## 2016-04-16 DIAGNOSIS — R609 Edema, unspecified: Secondary | ICD-10-CM | POA: Diagnosis not present

## 2016-04-16 DIAGNOSIS — R7989 Other specified abnormal findings of blood chemistry: Secondary | ICD-10-CM

## 2016-04-16 DIAGNOSIS — R799 Abnormal finding of blood chemistry, unspecified: Secondary | ICD-10-CM | POA: Diagnosis not present

## 2016-04-23 ENCOUNTER — Telehealth: Payer: Self-pay | Admitting: Dietician

## 2016-04-23 NOTE — Telephone Encounter (Signed)
Called patient regarding class which she missed on 04/18/16. She states she forgot to come to the class; she rescheduled to Thursday, September 7 at [facility].

## 2016-04-24 ENCOUNTER — Other Ambulatory Visit: Payer: Self-pay | Admitting: Internal Medicine

## 2016-04-25 ENCOUNTER — Telehealth: Payer: Self-pay

## 2016-04-25 NOTE — Telephone Encounter (Signed)
Pt is calling states she would like to get off of the gabapentin and get something else that won't make her "so drowsy" she said she heard of cymbalta.

## 2016-04-26 NOTE — Telephone Encounter (Signed)
I think we need to discuss medication changes in the office. It would be okay for her to try off the gabapentin though---have her reduce it over a few days and then stop, if she does want to get off it

## 2016-04-28 NOTE — Telephone Encounter (Signed)
Spoke to pt. She said she did not want to go off of Gabapentin right now. She is working on figuring out exactly how much she needs to get by. Made her an appt 05-07-16 to discuss medication

## 2016-04-29 ENCOUNTER — Other Ambulatory Visit: Payer: Self-pay | Admitting: Internal Medicine

## 2016-04-29 NOTE — Telephone Encounter (Signed)
Last filled 04/02/2016 by Lubertha Basque advise if okay for pt to continue medication

## 2016-04-30 NOTE — Telephone Encounter (Signed)
Approved: #30 x 0 Daily prn

## 2016-05-07 ENCOUNTER — Encounter: Payer: Self-pay | Admitting: Internal Medicine

## 2016-05-07 ENCOUNTER — Ambulatory Visit (INDEPENDENT_AMBULATORY_CARE_PROVIDER_SITE_OTHER): Payer: Commercial Managed Care - HMO | Admitting: Internal Medicine

## 2016-05-07 ENCOUNTER — Ambulatory Visit: Payer: Self-pay | Admitting: Internal Medicine

## 2016-05-07 VITALS — BP 138/88 | HR 67 | Temp 97.4°F | Wt 191.5 lb

## 2016-05-07 DIAGNOSIS — R413 Other amnesia: Secondary | ICD-10-CM

## 2016-05-07 DIAGNOSIS — F321 Major depressive disorder, single episode, moderate: Secondary | ICD-10-CM

## 2016-05-07 DIAGNOSIS — M160 Bilateral primary osteoarthritis of hip: Secondary | ICD-10-CM

## 2016-05-07 DIAGNOSIS — F329 Major depressive disorder, single episode, unspecified: Secondary | ICD-10-CM | POA: Insufficient documentation

## 2016-05-07 DIAGNOSIS — F3341 Major depressive disorder, recurrent, in partial remission: Secondary | ICD-10-CM | POA: Insufficient documentation

## 2016-05-07 MED ORDER — DULOXETINE HCL 30 MG PO CPEP: 30.0000 mg | ORAL_CAPSULE | Freq: Every day | ORAL | 3 refills | Status: DC

## 2016-05-07 NOTE — Assessment & Plan Note (Signed)
I suspect pseudodementia Will treat her depression and then check her with [facility]

## 2016-05-07 NOTE — Patient Instructions (Signed)
Please call if you have any problems with the new medication

## 2016-05-07 NOTE — Assessment & Plan Note (Signed)
Mild arthritis on x-ray a year ago ?worsened Would consider ortho evaluation if pain is ongoing issue

## 2016-05-07 NOTE — Assessment & Plan Note (Signed)
Now seems to fit criteria for MDD Will start treatment Will try cymbalta--given her pain issues as well

## 2016-05-07 NOTE — Progress Notes (Signed)
Subjective:    Patient ID: [name], female    DOB: Jan 16, 1946, 70 y.o.   MRN: [id number]  HPI Here due to memory problems She is recording visit for her son  She has found Dr Virgina Organ on line States he can reverse Alzheimers Then discussed that she doesn't have diagnosis of dementia Forgets things easily Still cries easily--very emotional Had sad feelings every day Tries to fight it--singing songs, etc  Sleeps okay--but disturbed by frequent nocturia Trying to eat healthier--has lost 10# over the past year or so  Current Outpatient Prescriptions on File Prior to Visit  Medication Sig Dispense Refill  . Ascorbic Acid (VITAMIN C WITH ROSE HIPS) 1000 MG tablet Take 1,000 mg by mouth daily.    Marland Kitchen BAYER MICROLET LANCETS lancets Use as instructed to test blood sugar once daily dx: R73.03 100 each 1  . Blood Glucose Monitoring Suppl (ACCU-CHEK AVIVA PLUS) w/Device KIT Use to test blood sugar once daily Dx: R73.03 1 kit 0  . Coenzyme Q10 (CO Q 10 PO) Take 1 capsule by mouth daily.    Marland Kitchen docusate sodium (COLACE) 100 MG capsule Take 1 capsule (100 mg total) by mouth 2 (two) times daily. 60 capsule 0  . furosemide (LASIX) 20 MG tablet TAKE 1 TABLET BY MOUTH DAILY FOR 5 DAYS, THEN DAILY AS NEEDED FOR INCREASED LOWER EXTREMITY EDEMA 30 tablet 0  . gabapentin (NEURONTIN) 300 MG capsule Take 300 mg by mouth every 4 (four) hours.    Marland Kitchen glucose blood (ACCU-CHEK AVIVA PLUS) test strip Use to test blood sugar once daily Dx: R73.03 100 each 1  . lisinopril-hydrochlorothiazide (PRINZIDE,ZESTORETIC) 20-25 MG tablet TAKE 2 TABLETS BY MOUTH DAILY 180 tablet 1  . metFORMIN (GLUCOPHAGE XR) 500 MG 24 hr tablet Take 1 tablet (500 mg total) by mouth daily with breakfast. 90 tablet 3  . Multiple Vitamins-Minerals (MULTIVITAMIN WITH MINERALS) tablet Take 2 tablets by mouth daily.     . NON FORMULARY Calcium 250 mg+ Magnesium 250 mg + Potassium 45 mg Takes 2 tablets daily.    . Omega-3 Fatty Acids (FISH OIL)  1000 MG CAPS Take 1 capsule by mouth daily.    Marland Kitchen OVER THE COUNTER MEDICATION Take 2 tablets by mouth daily. Focus Formula    . Oxycodone HCl 10 MG TABS Take 1 tablet by mouth 4 (four) times daily as needed.  0  . Soya Lecithin 1200 MG CAPS Take 1 tablet by mouth daily.      No current facility-administered medications on file prior to visit.     No Known Allergies  Past Medical History:  Diagnosis Date  . Arthritis   . Confusion   . Diabetes mellitus without complication ([place])   . Hepatitis   . Hypertension   . Lumbar stenosis   . Memory loss   . Nocturia   . Numbness and tingling    legs and feet  . Wears glasses     Past Surgical History:  Procedure Laterality Date  . [place]  . COLONOSCOPY    . HEMORRHOID SURGERY  2015  . TRANSFORAMINAL LUMBAR INTERBODY FUSION (TLIF) WITH PEDICLE SCREW FIXATION 2 LEVEL N/A 11/07/2015   Procedure: Lumbar three-four Lumbar four-five  transforaminal lumbar interbody fusion with interbody prosthesis posterior lateral arthrodesis and posterior segmental instrumentation;  Surgeon: Newman Pies, MD;  Location: [facility];  Service: Neurosurgery;  Laterality: N/A;    Family History  Problem Relation Age of Onset  . Hypertension Other   .  Cancer Other     ovarian  . Cancer Mother   . Heart disease Father     Social History   Social History  . Marital status: Divorced    Spouse name: N/A  . Number of children: 4  . Years of education: N/A   Occupational History  . Not on file.   Social History Main Topics  . Smoking status: Never Smoker  . Smokeless tobacco: Never Used  . Alcohol use No     Comment: rarely  . Drug use: No  . Sexual activity: Not on file   Other Topics Concern  . Not on file   Social History Narrative   Has a living will.   Son is health care POA   Would accept resuscitation.  Would not want prolonged life support if futile.   Probably would not want tube feeds if cognitively unaware    Review of Systems Walks with walker for stability at times Ongoing pain issues--concerned it may be from her hips Not as much in her back    Objective:   Physical Exam  Constitutional: She appears well-developed and well-nourished.  Musculoskeletal:  Normal gait but sig pain with internal rotation of left hip  Psychiatric:  Tearful and depressed Somewhat labile affect          Assessment & Plan:

## 2016-05-09 NOTE — Telephone Encounter (Signed)
Pt called to get answer to email on 05/07/16. Pt was advised per Dr Everardo Beals answer 05/07/16. Pt voiced understanding and nothing further needed.

## 2016-05-15 ENCOUNTER — Encounter: Payer: Commercial Managed Care - HMO | Attending: Internal Medicine | Admitting: Dietician

## 2016-05-15 ENCOUNTER — Encounter: Payer: Self-pay | Admitting: Dietician

## 2016-05-15 VITALS — BP 122/74 | Ht 64.0 in | Wt 187.2 lb

## 2016-05-15 DIAGNOSIS — E119 Type 2 diabetes mellitus without complications: Secondary | ICD-10-CM | POA: Diagnosis not present

## 2016-05-15 NOTE — Progress Notes (Signed)

## 2016-05-20 ENCOUNTER — Encounter: Payer: Self-pay | Admitting: Dietician

## 2016-05-23 ENCOUNTER — Encounter: Payer: Self-pay | Admitting: Internal Medicine

## 2016-05-23 ENCOUNTER — Ambulatory Visit (INDEPENDENT_AMBULATORY_CARE_PROVIDER_SITE_OTHER): Payer: Commercial Managed Care - HMO | Admitting: Internal Medicine

## 2016-05-23 ENCOUNTER — Ambulatory Visit: Payer: Self-pay | Admitting: Internal Medicine

## 2016-05-23 VITALS — BP 114/70 | HR 68 | Temp 98.3°F | Wt 188.0 lb

## 2016-05-23 DIAGNOSIS — F321 Major depressive disorder, single episode, moderate: Secondary | ICD-10-CM

## 2016-05-23 DIAGNOSIS — Z23 Encounter for immunization: Secondary | ICD-10-CM

## 2016-05-23 DIAGNOSIS — G8929 Other chronic pain: Secondary | ICD-10-CM

## 2016-05-23 DIAGNOSIS — M549 Dorsalgia, unspecified: Secondary | ICD-10-CM | POA: Diagnosis not present

## 2016-05-23 MED ORDER — DULOXETINE HCL 60 MG PO CPEP: 60.0000 mg | ORAL_CAPSULE | Freq: Every day | ORAL | 3 refills | Status: DC

## 2016-05-23 NOTE — Progress Notes (Signed)
Subjective:    Patient ID: [name], female    DOB: 19-Aug-1946, 70 y.o.   MRN: [id number]  HPI Here for follow up of depression  She feels her mood is some better No apparent side effects Ongoing chronic pain---this will still make her emotional  Trying to keep connected Going to the gym  Didn't think the gabapentin was helping Did stop it recently Still gets some relief if she takes the oxycodone  Current Outpatient Prescriptions on File Prior to Visit  Medication Sig Dispense Refill  . Ascorbic Acid (VITAMIN C WITH ROSE HIPS) 1000 MG tablet Take 1,000 mg by mouth daily.    Marland Kitchen BAYER MICROLET LANCETS lancets Use as instructed to test blood sugar once daily dx: R73.03 100 each 1  . Blood Glucose Monitoring Suppl (ACCU-CHEK AVIVA PLUS) w/Device KIT Use to test blood sugar once daily Dx: R73.03 1 kit 0  . CINNAMON PO Take by mouth.    . Coenzyme Q10 (CO Q 10 PO) Take 1 capsule by mouth daily.    . DULoxetine (CYMBALTA) 30 MG capsule Take 1 capsule (30 mg total) by mouth daily. 30 capsule 3  . furosemide (LASIX) 20 MG tablet TAKE 1 TABLET BY MOUTH DAILY FOR 5 DAYS, THEN DAILY AS NEEDED FOR INCREASED LOWER EXTREMITY EDEMA 30 tablet 0  . glucose blood (ACCU-CHEK AVIVA PLUS) test strip Use to test blood sugar once daily Dx: R73.03 100 each 1  . lisinopril-hydrochlorothiazide (PRINZIDE,ZESTORETIC) 20-25 MG tablet TAKE 2 TABLETS BY MOUTH DAILY 180 tablet 1  . metFORMIN (GLUCOPHAGE XR) 500 MG 24 hr tablet Take 1 tablet (500 mg total) by mouth daily with breakfast. 90 tablet 3  . Multiple Vitamins-Minerals (MULTIVITAMIN WITH MINERALS) tablet Take 1 tablet by mouth daily.     . NON FORMULARY Calcium 250 mg+ Magnesium 250 mg + Potassium 45 mg Takes 2 tablets daily.    . Omega-3 Fatty Acids (FISH OIL) 1000 MG CAPS Take 1 capsule by mouth daily.    Marland Kitchen OVER THE COUNTER MEDICATION Take 2 tablets by mouth daily. Focus Formula    . Oxycodone HCl 10 MG TABS Take 1 tablet by mouth 4 (four) times  daily as needed.  0  . Soya Lecithin 1200 MG CAPS Take 1 tablet by mouth daily.      No current facility-administered medications on file prior to visit.     No Known Allergies  Past Medical History:  Diagnosis Date  . Arthritis   . Confusion   . Diabetes mellitus without complication ([place])   . Hepatitis   . Hypertension   . Lumbar stenosis   . Memory loss   . Nocturia   . Numbness and tingling    legs and feet  . Wears glasses     Past Surgical History:  Procedure Laterality Date  . [place]  . COLONOSCOPY    . HEMORRHOID SURGERY  2015  . TRANSFORAMINAL LUMBAR INTERBODY FUSION (TLIF) WITH PEDICLE SCREW FIXATION 2 LEVEL N/A 11/07/2015   Procedure: Lumbar three-four Lumbar four-five  transforaminal lumbar interbody fusion with interbody prosthesis posterior lateral arthrodesis and posterior segmental instrumentation;  Surgeon: Newman Pies, MD;  Location: [facility];  Service: Neurosurgery;  Laterality: N/A;    Family History  Problem Relation Age of Onset  . Hypertension Other   . Cancer Other     ovarian  . Cancer Mother   . Heart disease Father     Social History   Social  History  . Marital status: Divorced    Spouse name: N/A  . Number of children: 4  . Years of education: N/A   Occupational History  . Not on file.   Social History Main Topics  . Smoking status: Never Smoker  . Smokeless tobacco: Never Used  . Alcohol use No     Comment: rarely  . Drug use: No  . Sexual activity: Not on file   Other Topics Concern  . Not on file   Social History Narrative   Has a living will.   Son is health care POA   Would accept resuscitation.  Would not want prolonged life support if futile.   Probably would not want tube feeds if cognitively unaware   Review of Systems Appetite is okay Sleep is still disturbed by nocturia. Pain will make her have to reposition    Objective:   Physical Exam  Constitutional: She appears well-developed  and well-nourished. No distress.  Psychiatric:  Not as depressed--more vocal Not complaining much Verbalizes improvement Normal appearance          Assessment & Plan:

## 2016-05-23 NOTE — Progress Notes (Signed)
Pre visit review using our clinic review tool, if applicable. No additional management support is needed unless otherwise documented below in the visit note. 

## 2016-05-23 NOTE — Assessment & Plan Note (Signed)
Seems to be some better --stopped the gabapentin. Will try increasing to see if gets better relief

## 2016-05-23 NOTE — Addendum Note (Signed)
Addended by: Pilar Grammes on: 05/23/2016 02:09 PM   Modules accepted: Orders

## 2016-05-23 NOTE — Assessment & Plan Note (Signed)
Has had a response to the duloxetine Discussed increasing--will try due to the pain component

## 2016-05-23 NOTE — Patient Instructions (Signed)
Please try increasing the duloxetine to 60mg  daily. Try 2 of the 30mg  capsules daily (can take at the same time) first. If you do okay with it, you can fill the new prescription.

## 2016-05-26 ENCOUNTER — Telehealth: Payer: Self-pay

## 2016-05-26 NOTE — Telephone Encounter (Signed)
She said she pretty much laid around all day. No energy. No appetite. Not sweating like she was yesterday. No Fever. She had no improvement from the weekend. I advised her that she did not have the flu from the flu shot. I explained to her that she may have had an illness that had not come out yet and getting the flu shot could have possibly brought the illness to the surface or it was something she caught at the dinner. She wanted to know what Dr Silvio Pate thought since he knew her symptoms

## 2016-05-26 NOTE — Telephone Encounter (Signed)
Pt left v/m; pt seen 05/23/16 and also got flu shot; over the weekend pt had sweating on and off; pt feels "bad"the patient has been laying around over weekend. Pt is not sure if she feels bad due to a covered dish she went to on Sat night or the med causing the symptoms or pt wants to know if she could have the flu. Pt request cb.

## 2016-05-26 NOTE — Telephone Encounter (Signed)
She can't get the flu from the flu shot Check on her to get more specifics

## 2016-05-27 NOTE — Telephone Encounter (Signed)
She could possibly have had a reaction to the flu shot--or she caught a viral infection. If she is not having trouble breathing, and her energy is slowly improving--we can just wait it out. If she is worried, or wants a reevaluation, offer her an appointment.

## 2016-05-27 NOTE — Telephone Encounter (Signed)
Spoke to pt. She said she still feels really bad. She is at the vet with her cat right now. Said she would call back and make an appt. I advised her she may have to see someone else as Dr Alla German schedule is full today and tomorrow.

## 2016-06-11 ENCOUNTER — Ambulatory Visit: Payer: Commercial Managed Care - HMO | Admitting: Internal Medicine

## 2016-06-11 ENCOUNTER — Telehealth: Payer: Self-pay | Admitting: Internal Medicine

## 2016-06-11 DIAGNOSIS — Z0289 Encounter for other administrative examinations: Secondary | ICD-10-CM

## 2016-06-11 NOTE — Telephone Encounter (Signed)
Patient did not come in for their appointment today for 3 week follow up Please let me know if patient needs to be contacted immediately for follow up or no follow up needed.

## 2016-06-12 ENCOUNTER — Encounter: Payer: Self-pay | Admitting: Internal Medicine

## 2016-06-12 ENCOUNTER — Ambulatory Visit (INDEPENDENT_AMBULATORY_CARE_PROVIDER_SITE_OTHER): Payer: Commercial Managed Care - HMO | Admitting: Internal Medicine

## 2016-06-12 VITALS — BP 110/66 | HR 97 | Temp 97.5°F | Wt 183.0 lb

## 2016-06-12 DIAGNOSIS — Z205 Contact with and (suspected) exposure to viral hepatitis: Secondary | ICD-10-CM | POA: Diagnosis not present

## 2016-06-12 DIAGNOSIS — M549 Dorsalgia, unspecified: Secondary | ICD-10-CM

## 2016-06-12 DIAGNOSIS — G8929 Other chronic pain: Secondary | ICD-10-CM | POA: Diagnosis not present

## 2016-06-12 DIAGNOSIS — F321 Major depressive disorder, single episode, moderate: Secondary | ICD-10-CM

## 2016-06-12 NOTE — Progress Notes (Signed)
Subjective:    Patient ID: [name], female    DOB: 09/15/1945, 70 y.o.   MRN: [id number]  HPI Here for follow up of depression  "I have had the most crazy 3 weeks" Stomach cramps Wondered if it was due to stopping the gabapentin or wondered if it was reaction to flu vaccine or the duloxetine  Someone told her to try some vitamins--"Thrive" in shake and a patch Started this 2-3 weeks ago--but only took for 4 days Not taking the duloxetine then  Restarted the duloxetine 2-3 days ago Hard to tell whether it is doing anything  Now having urgent bowel movements again Wondered if this was due to stopping the gabapentin Did notice some blood once when she tried to disimpact herself  Current Outpatient Prescriptions on File Prior to Visit  Medication Sig Dispense Refill  . Ascorbic Acid (VITAMIN C WITH ROSE HIPS) 1000 MG tablet Take 1,000 mg by mouth daily.    Marland Kitchen BAYER MICROLET LANCETS lancets Use as instructed to test blood sugar once daily dx: R73.03 100 each 1  . Blood Glucose Monitoring Suppl (ACCU-CHEK AVIVA PLUS) w/Device KIT Use to test blood sugar once daily Dx: R73.03 1 kit 0  . CINNAMON PO Take by mouth.    . Coenzyme Q10 (CO Q 10 PO) Take 1 capsule by mouth daily.    . cyanocobalamin 500 MCG tablet Take 500 mcg by mouth daily.    . DULoxetine (CYMBALTA) 60 MG capsule Take 1 capsule (60 mg total) by mouth daily. 30 capsule 3  . furosemide (LASIX) 20 MG tablet TAKE 1 TABLET BY MOUTH DAILY FOR 5 DAYS, THEN DAILY AS NEEDED FOR INCREASED LOWER EXTREMITY EDEMA 30 tablet 0  . glucose blood (ACCU-CHEK AVIVA PLUS) test strip Use to test blood sugar once daily Dx: R73.03 100 each 1  . lisinopril-hydrochlorothiazide (PRINZIDE,ZESTORETIC) 20-25 MG tablet TAKE 2 TABLETS BY MOUTH DAILY 180 tablet 1  . metFORMIN (GLUCOPHAGE XR) 500 MG 24 hr tablet Take 1 tablet (500 mg total) by mouth daily with breakfast. 90 tablet 3  . Multiple Vitamins-Minerals (MULTIVITAMIN WITH MINERALS) tablet  Take 1 tablet by mouth daily.     . NON FORMULARY Calcium 250 mg+ Magnesium 250 mg + Potassium 45 mg Takes 2 tablets daily.    . Omega-3 Fatty Acids (FISH OIL) 1000 MG CAPS Take 1 capsule by mouth daily.    Marland Kitchen OVER THE COUNTER MEDICATION Take 2 tablets by mouth daily. Focus Formula    . Oxycodone HCl 10 MG TABS Take 1 tablet by mouth 4 (four) times daily as needed.  0  . Soya Lecithin 1200 MG CAPS Take 1 tablet by mouth daily.      No current facility-administered medications on file prior to visit.     No Known Allergies  Past Medical History:  Diagnosis Date  . Arthritis   . Confusion   . Diabetes mellitus without complication ([place])   . Hepatitis   . Hypertension   . Lumbar stenosis   . Memory loss   . Nocturia   . Numbness and tingling    legs and feet  . Wears glasses     Past Surgical History:  Procedure Laterality Date  . [place]  . COLONOSCOPY    . HEMORRHOID SURGERY  2015  . TRANSFORAMINAL LUMBAR INTERBODY FUSION (TLIF) WITH PEDICLE SCREW FIXATION 2 LEVEL N/A 11/07/2015   Procedure: Lumbar three-four Lumbar four-five  transforaminal lumbar interbody fusion with interbody prosthesis  posterior lateral arthrodesis and posterior segmental instrumentation;  Surgeon: Newman Pies, MD;  Location: [facility];  Service: Neurosurgery;  Laterality: N/A;    Family History  Problem Relation Age of Onset  . Hypertension Other   . Cancer Other     ovarian  . Cancer Mother   . Heart disease Father     Social History   Social History  . Marital status: Divorced    Spouse name: N/A  . Number of children: 4  . Years of education: N/A   Occupational History  . Not on file.   Social History Main Topics  . Smoking status: Never Smoker  . Smokeless tobacco: Never Used  . Alcohol use No     Comment: rarely  . Drug use: No  . Sexual activity: Not on file   Other Topics Concern  . Not on file   Social History Narrative   Has a living will.   Son is  health care POA   Would accept resuscitation.  Would not want prolonged life support if futile.   Probably would not want tube feeds if cognitively unaware   Review of Systems Generally not constipated Appetite is mediocre Has lost more weight Still uses the oxycodone fairly reguarly    Objective:   Physical Exam  Psychiatric:  Tearful when recounting her pain Taking the oxycodone more than she is supposed to-- but trying to stay within her dose          Assessment & Plan:

## 2016-06-12 NOTE — Progress Notes (Signed)
Pre visit review using our clinic review tool, if applicable. No additional management support is needed unless otherwise documented below in the visit note. 

## 2016-06-12 NOTE — Assessment & Plan Note (Signed)
Overusing the oxycodone-- stopped the gabapentin (but is not clear about whether it was helping) I am hoping the duloxetine will help this also

## 2016-06-12 NOTE — Patient Instructions (Signed)
Please take the duloxetine every day!! Let me know if you have any problems with it but don't stop unless we discuss it first.

## 2016-06-12 NOTE — Telephone Encounter (Signed)
No need to reschedule She has appt in November and that will be fine

## 2016-06-12 NOTE — Assessment & Plan Note (Signed)
Stopped her med and only just restarted Will give the duloxetine a try now and keep follow up next month

## 2016-06-13 ENCOUNTER — Ambulatory Visit: Payer: Self-pay | Admitting: Internal Medicine

## 2016-06-13 LAB — HEPATITIS PANEL, ACUTE
HCV AB: NEGATIVE
HEP A IGM: NONREACTIVE
HEP B S AG: NEGATIVE
Hep B C IgM: NONREACTIVE

## 2016-06-26 DIAGNOSIS — H524 Presbyopia: Secondary | ICD-10-CM | POA: Diagnosis not present

## 2016-06-26 DIAGNOSIS — H5213 Myopia, bilateral: Secondary | ICD-10-CM | POA: Diagnosis not present

## 2016-06-28 DIAGNOSIS — H5213 Myopia, bilateral: Secondary | ICD-10-CM | POA: Diagnosis not present

## 2016-06-28 DIAGNOSIS — H524 Presbyopia: Secondary | ICD-10-CM | POA: Diagnosis not present

## 2016-07-10 ENCOUNTER — Telehealth: Payer: Self-pay

## 2016-07-10 MED ORDER — GLUCOSE BLOOD VI STRP: ORAL_STRIP | 1 refills | Status: DC

## 2016-07-10 MED ORDER — ACCU-CHEK AVIVA PLUS W/DEVICE KIT: PACK | 0 refills | Status: DC

## 2016-07-10 NOTE — Telephone Encounter (Signed)
Pt calls back and cannot find her accu chek meter. Pt request meter sent to [facility]. Advised pt done.

## 2016-07-10 NOTE — Telephone Encounter (Signed)
Pt left v/m; pt changed to [facility] ins and pt is out of test strips; pt request refill to [facility]. Unable to reach pt by phone; refill done as requested. Pt last seen 06/12/16.

## 2016-07-10 NOTE — Addendum Note (Signed)
Addended by: Helene Shoe on: 07/10/2016 04:11 PM   Modules accepted: Orders

## 2016-07-17 ENCOUNTER — Ambulatory Visit (INDEPENDENT_AMBULATORY_CARE_PROVIDER_SITE_OTHER): Payer: Medicare Other | Admitting: Internal Medicine

## 2016-07-17 ENCOUNTER — Encounter: Payer: Self-pay | Admitting: Internal Medicine

## 2016-07-17 VITALS — BP 122/82 | HR 84 | Temp 98.6°F | Wt 181.0 lb

## 2016-07-17 DIAGNOSIS — E1140 Type 2 diabetes mellitus with diabetic neuropathy, unspecified: Secondary | ICD-10-CM

## 2016-07-17 DIAGNOSIS — F321 Major depressive disorder, single episode, moderate: Secondary | ICD-10-CM

## 2016-07-17 DIAGNOSIS — M4316 Spondylolisthesis, lumbar region: Secondary | ICD-10-CM

## 2016-07-17 MED ORDER — ACCU-CHEK SOFT TOUCH LANCETS MISC: 12 refills | Status: DC

## 2016-07-17 NOTE — Progress Notes (Signed)
Subjective:    Patient ID: [name], female    DOB: Jan 25, 1946, 70 y.o.   MRN: [id number]  HPI Here for follow up of MDD and pain issues  She thinks the duloxetine is "wonderful" No longer having the "gripping" pain Did wean off the gabapentin No longer taking the oxycodone either  Still gets some pain in hips upon standing Will have to sit down to relieve the pain Can walk in gym for a mile at 2.5MPH Had been doing Silver Sneakers--- not doing that now (too long)  Has been checking sugars Fasting usually 110 167 after lunch  Current Outpatient Prescriptions on File Prior to Visit  Medication Sig Dispense Refill  . Ascorbic Acid (VITAMIN C WITH ROSE HIPS) 1000 MG tablet Take 1,000 mg by mouth daily.    . Blood Glucose Monitoring Suppl (ACCU-CHEK AVIVA PLUS) w/Device KIT Use to test blood sugar once daily Dx: R73.03 1 kit 0  . CINNAMON PO Take by mouth.    . Coenzyme Q10 (CO Q 10 PO) Take 1 capsule by mouth daily.    . cyanocobalamin 500 MCG tablet Take 500 mcg by mouth daily.    . DULoxetine (CYMBALTA) 60 MG capsule Take 1 capsule (60 mg total) by mouth daily. 30 capsule 3  . glucose blood (ACCU-CHEK AVIVA PLUS) test strip Use to test blood sugar once daily Dx: R73.03 100 each 1  . lisinopril-hydrochlorothiazide (PRINZIDE,ZESTORETIC) 20-25 MG tablet TAKE 2 TABLETS BY MOUTH DAILY 180 tablet 1  . metFORMIN (GLUCOPHAGE XR) 500 MG 24 hr tablet Take 1 tablet (500 mg total) by mouth daily with breakfast. 90 tablet 3  . Multiple Vitamins-Minerals (MULTIVITAMIN WITH MINERALS) tablet Take 1 tablet by mouth daily.     . NON FORMULARY Calcium 250 mg+ Magnesium 250 mg + Potassium 45 mg Takes 2 tablets daily.    . Omega-3 Fatty Acids (FISH OIL) 1000 MG CAPS Take 1 capsule by mouth daily.    Marland Kitchen OVER THE COUNTER MEDICATION Take 2 tablets by mouth daily. Focus Formula    . Soya Lecithin 1200 MG CAPS Take 1 tablet by mouth daily.     . furosemide (LASIX) 20 MG tablet TAKE 1 TABLET BY  MOUTH DAILY FOR 5 DAYS, THEN DAILY AS NEEDED FOR INCREASED LOWER EXTREMITY EDEMA (Patient not taking: Reported on 07/17/2016) 30 tablet 0   No current facility-administered medications on file prior to visit.     No Known Allergies  Past Medical History:  Diagnosis Date  . Arthritis   . Confusion   . Diabetes mellitus without complication ([place])   . Hepatitis   . Hypertension   . Lumbar stenosis   . Memory loss   . Nocturia   . Numbness and tingling    legs and feet  . Wears glasses     Past Surgical History:  Procedure Laterality Date  . [place]  . COLONOSCOPY    . HEMORRHOID SURGERY  2015  . TRANSFORAMINAL LUMBAR INTERBODY FUSION (TLIF) WITH PEDICLE SCREW FIXATION 2 LEVEL N/A 11/07/2015   Procedure: Lumbar three-four Lumbar four-five  transforaminal lumbar interbody fusion with interbody prosthesis posterior lateral arthrodesis and posterior segmental instrumentation;  Surgeon: Newman Pies, MD;  Location: [facility];  Service: Neurosurgery;  Laterality: N/A;    Family History  Problem Relation Age of Onset  . Hypertension Other   . Cancer Other     ovarian  . Cancer Mother   . Heart disease Father  Social History   Social History  . Marital status: Divorced    Spouse name: N/A  . Number of children: 4  . Years of education: N/A   Occupational History  . Not on file.   Social History Main Topics  . Smoking status: Never Smoker  . Smokeless tobacco: Never Used  . Alcohol use No     Comment: rarely  . Drug use: No  . Sexual activity: Not on file   Other Topics Concern  . Not on file   Social History Narrative   Has a living will.   Son is health care POA   Would accept resuscitation.  Would not want prolonged life support if futile.   Probably would not want tube feeds if cognitively unaware   Review of Systems Weight down 30# in this year Usually sleeps okay--but will use tylenol at night at times Some tingling in feet at  times Urgent AM bowel movements--- has had incontinence x 2    Objective:   Physical Exam  Constitutional: She appears well-developed and well-nourished. No distress.  Neck: No thyromegaly present.  Cardiovascular: Normal rate, regular rhythm, normal heart sounds and intact distal pulses.   No murmur heard. Pulmonary/Chest: Effort normal and breath sounds normal. No respiratory distress. She has no wheezes. She has no rales.  Musculoskeletal: She exhibits no edema or tenderness.  Lymphadenopathy:    She has no cervical adenopathy.  Skin: No rash noted. No erythema.  No foot lesions  Psychiatric: She has a normal mood and affect. Her behavior is normal.  Upbeat!          Assessment & Plan:

## 2016-07-17 NOTE — Progress Notes (Signed)
Pre visit review using our clinic review tool, if applicable. No additional management support is needed unless otherwise documented below in the visit note. 

## 2016-07-17 NOTE — Assessment & Plan Note (Signed)
Considerably better with the duloxetine

## 2016-07-17 NOTE — Assessment & Plan Note (Signed)
Seems to have good control. 

## 2016-07-17 NOTE — Assessment & Plan Note (Signed)
Pain is better now Off oxycodone and gabapentin (and her mind is more clear)

## 2016-07-18 LAB — LIPID PANEL
CHOLESTEROL: 194 mg/dL (ref 0–200)
HDL: 67.9 mg/dL (ref >39.00)
LDL Cholesterol: 91 mg/dL (ref 0–99)
NonHDL: 125.96
TRIGLYCERIDES: 177 mg/dL — AB (ref 0.0–149.0)
Total CHOL/HDL Ratio: 3
VLDL: 35.4 mg/dL (ref 0.0–40.0)

## 2016-07-18 LAB — HEMOGLOBIN A1C: Hgb A1c MFr Bld: 5.8 % (ref 4.6–6.5)

## 2016-07-21 ENCOUNTER — Encounter: Payer: Self-pay | Admitting: Internal Medicine

## 2016-07-24 ENCOUNTER — Encounter: Payer: Self-pay | Admitting: Internal Medicine

## 2016-08-06 ENCOUNTER — Encounter: Payer: Self-pay | Admitting: Internal Medicine

## 2016-08-06 ENCOUNTER — Ambulatory Visit (INDEPENDENT_AMBULATORY_CARE_PROVIDER_SITE_OTHER)
Admission: RE | Admit: 2016-08-06 | Discharge: 2016-08-06 | Disposition: A | Discharge status: Home / Self Care | Payer: Medicare Other | Source: Ambulatory Visit | Attending: Internal Medicine | Admitting: Internal Medicine

## 2016-08-06 ENCOUNTER — Ambulatory Visit (INDEPENDENT_AMBULATORY_CARE_PROVIDER_SITE_OTHER): Payer: Medicare Other | Admitting: Internal Medicine

## 2016-08-06 VITALS — BP 110/70 | HR 87 | Wt 181.0 lb

## 2016-08-06 DIAGNOSIS — M25551 Pain in right hip: Secondary | ICD-10-CM

## 2016-08-06 NOTE — Progress Notes (Signed)
Pre visit review using our clinic review tool, if applicable. No additional management support is needed unless otherwise documented below in the visit note. 

## 2016-08-06 NOTE — Assessment & Plan Note (Signed)
But also puzzling sensory changes Exam not consistent with sig arthritis in hip ??radicular component Will check x-ray of hip---if it looks okay, will have her go back to Dr Arnoldo Morale

## 2016-08-06 NOTE — Progress Notes (Signed)
Subjective:    Patient ID: [name], female    DOB: Aug 18, 1946, 70 y.o.   MRN: [id number]  HPI Here due to sensation changes and pain in right hip Reviewed recent emails  Now having trouble with right hip Feels weak--trouble going up stairs (can't put that up first) Notices this since the back surgery--now that this doesn't hurt  Sensory changes along lateral right hip (doesn't feel normal to touch) Feet start with "gripping sensation" when in bed--has to use pain pill at bedtime and twice during the day Pain in left hip also--but not as bad  Current Outpatient Prescriptions on File Prior to Visit  Medication Sig Dispense Refill  . acetaminophen (TYLENOL) 500 MG tablet Take 1,000 mg by mouth every 8 (eight) hours as needed.    . Ascorbic Acid (VITAMIN C WITH ROSE HIPS) 1000 MG tablet Take 1,000 mg by mouth daily.    . Blood Glucose Monitoring Suppl (ACCU-CHEK AVIVA PLUS) w/Device KIT Use to test blood sugar once daily Dx: R73.03 1 kit 0  . CINNAMON PO Take by mouth.    . Coenzyme Q10 (CO Q 10 PO) Take 1 capsule by mouth daily.    . cyanocobalamin 500 MCG tablet Take 500 mcg by mouth daily.    . DULoxetine (CYMBALTA) 60 MG capsule Take 1 capsule (60 mg total) by mouth daily. 30 capsule 3  . furosemide (LASIX) 20 MG tablet TAKE 1 TABLET BY MOUTH DAILY FOR 5 DAYS, THEN DAILY AS NEEDED FOR INCREASED LOWER EXTREMITY EDEMA (Patient not taking: Reported on 07/17/2016) 30 tablet 0  . glucose blood (ACCU-CHEK AVIVA PLUS) test strip Use to test blood sugar once daily Dx: R73.03 100 each 1  . Lancets (ACCU-CHEK SOFT TOUCH) lancets Use as instructed 100 each 12  . lisinopril-hydrochlorothiazide (PRINZIDE,ZESTORETIC) 20-25 MG tablet TAKE 2 TABLETS BY MOUTH DAILY 180 tablet 1  . metFORMIN (GLUCOPHAGE XR) 500 MG 24 hr tablet Take 1 tablet (500 mg total) by mouth daily with breakfast. 90 tablet 3  . Multiple Vitamins-Minerals (MULTIVITAMIN WITH MINERALS) tablet Take 1 tablet by mouth daily.       . NON FORMULARY Calcium 250 mg+ Magnesium 250 mg + Potassium 45 mg Takes 2 tablets daily.    . Omega-3 Fatty Acids (FISH OIL) 1000 MG CAPS Take 1 capsule by mouth daily.    Marland Kitchen OVER THE COUNTER MEDICATION Take 2 tablets by mouth daily. Focus Formula    . Soya Lecithin 1200 MG CAPS Take 1 tablet by mouth daily.      No current facility-administered medications on file prior to visit.     No Known Allergies  Past Medical History:  Diagnosis Date  . Arthritis   . Confusion   . Diabetes mellitus without complication ([place])   . Hepatitis   . Hypertension   . Lumbar stenosis   . Memory loss   . Nocturia   . Numbness and tingling    legs and feet  . Wears glasses     Past Surgical History:  Procedure Laterality Date  . [place]  . COLONOSCOPY    . HEMORRHOID SURGERY  2015  . TRANSFORAMINAL LUMBAR INTERBODY FUSION (TLIF) WITH PEDICLE SCREW FIXATION 2 LEVEL N/A 11/07/2015   Procedure: Lumbar three-four Lumbar four-five  transforaminal lumbar interbody fusion with interbody prosthesis posterior lateral arthrodesis and posterior segmental instrumentation;  Surgeon: Newman Pies, MD;  Location: [facility];  Service: Neurosurgery;  Laterality: N/A;    Family History  Problem Relation Age of Onset  . Hypertension Other   . Cancer Other     ovarian  . Cancer Mother   . Heart disease Father     Social History   Social History  . Marital status: Divorced    Spouse name: N/A  . Number of children: 4  . Years of education: N/A   Occupational History  . Not on file.   Social History Main Topics  . Smoking status: Never Smoker  . Smokeless tobacco: Never Used  . Alcohol use No     Comment: rarely  . Drug use: No  . Sexual activity: Not on file   Other Topics Concern  . Not on file   Social History Narrative   Has a living will.   Son is health care POA   Would accept resuscitation.  Would not want prolonged life support if futile.   Probably would not  want tube feeds if cognitively unaware   Review of Systems  No hip swelling No pain in knees     Objective:   Physical Exam  Musculoskeletal:  No spine tenderness Gets tingling in hip with S-I palpation Normal ROM of right hip but pain in left back with passive ROM Left hip has moderately decreased internal rotation  Neurological:  Normal gait          Assessment & Plan:

## 2016-08-06 NOTE — Patient Instructions (Signed)
If the hip x-ray looks okay, I recommend that you go back to Dr Arnoldo Morale for a reevaluation.

## 2016-08-07 ENCOUNTER — Encounter: Payer: Self-pay | Admitting: Internal Medicine

## 2016-08-15 ENCOUNTER — Encounter: Payer: Self-pay | Admitting: Internal Medicine

## 2016-08-19 DIAGNOSIS — I10 Essential (primary) hypertension: Secondary | ICD-10-CM | POA: Diagnosis not present

## 2016-08-19 DIAGNOSIS — M5416 Radiculopathy, lumbar region: Secondary | ICD-10-CM | POA: Diagnosis not present

## 2016-08-19 DIAGNOSIS — M4316 Spondylolisthesis, lumbar region: Secondary | ICD-10-CM | POA: Diagnosis not present

## 2016-08-19 DIAGNOSIS — M545 Low back pain: Secondary | ICD-10-CM | POA: Diagnosis not present

## 2016-08-23 ENCOUNTER — Encounter: Payer: Self-pay | Admitting: Internal Medicine

## 2016-08-29 ENCOUNTER — Other Ambulatory Visit: Payer: Self-pay | Admitting: Internal Medicine

## 2016-09-02 ENCOUNTER — Encounter: Payer: Self-pay | Admitting: Internal Medicine

## 2016-09-02 ENCOUNTER — Ambulatory Visit (INDEPENDENT_AMBULATORY_CARE_PROVIDER_SITE_OTHER): Payer: Medicare Other | Admitting: Internal Medicine

## 2016-09-02 VITALS — BP 118/70 | HR 76 | Temp 98.2°F | Wt 186.0 lb

## 2016-09-02 DIAGNOSIS — K625 Hemorrhage of anus and rectum: Secondary | ICD-10-CM

## 2016-09-02 MED ORDER — HYDROCORTISONE 2.5 % EX CREA: TOPICAL_CREAM | Freq: Three times a day (TID) | CUTANEOUS | 3 refills | Status: DC | PRN

## 2016-09-02 NOTE — Assessment & Plan Note (Signed)
Seems likely to be from the hemorrhoid Discussed options--- fecal testing vs repeat colonoscopy She chooses the fecal testing--if negative, no further action

## 2016-09-02 NOTE — Progress Notes (Signed)
Pre visit review using our clinic review tool, if applicable. No additional management support is needed unless otherwise documented below in the visit note. 

## 2016-09-02 NOTE — Progress Notes (Signed)
Subjective:    Patient ID: [name], female    DOB: 1946/09/06, 70 y.o.   MRN: [id number]  HPI Here due to rectal bleeding  Started about a month ago---no in the stool Around the outside and lots of blood in the bowl Thinks it is hemorrhoid No rectal pain  Does go frequently every day Gets urgency in AM but not later in day Yesterday 4 times--but only 1 had blood  Current Outpatient Prescriptions on File Prior to Visit  Medication Sig Dispense Refill  . acetaminophen (TYLENOL) 500 MG tablet Take 1,000 mg by mouth every 8 (eight) hours as needed.    . Ascorbic Acid (VITAMIN C WITH ROSE HIPS) 1000 MG tablet Take 1,000 mg by mouth daily.    . Blood Glucose Monitoring Suppl (ACCU-CHEK AVIVA PLUS) w/Device KIT Use to test blood sugar once daily Dx: R73.03 1 kit 0  . CINNAMON PO Take by mouth.    . Coenzyme Q10 (CO Q 10 PO) Take 1 capsule by mouth daily.    . cyanocobalamin 500 MCG tablet Take 500 mcg by mouth daily.    . DULoxetine (CYMBALTA) 60 MG capsule Take 1 capsule (60 mg total) by mouth daily. 30 capsule 3  . furosemide (LASIX) 20 MG tablet TAKE 1 TABLET BY MOUTH DAILY FOR 5 DAYS, THEN DAILY AS NEEDED FOR INCREASED LOWER EXTREMITY EDEMA 30 tablet 0  . glucose blood (ACCU-CHEK AVIVA PLUS) test strip Use to test blood sugar once daily Dx: R73.03 100 each 1  . Lancets (ACCU-CHEK SOFT TOUCH) lancets Use as instructed 100 each 12  . lisinopril-hydrochlorothiazide (PRINZIDE,ZESTORETIC) 20-25 MG tablet TAKE 2 TABLETS BY MOUTH DAILY 180 tablet 1  . metFORMIN (GLUCOPHAGE-XR) 500 MG 24 hr tablet TAKE 1 TABLET (500 MG TOTAL) BY MOUTH DAILY WITH BREAKFAST. 90 tablet 3  . Multiple Vitamins-Minerals (MULTIVITAMIN WITH MINERALS) tablet Take 1 tablet by mouth daily.     . NON FORMULARY Calcium 250 mg+ Magnesium 250 mg + Potassium 45 mg Takes 2 tablets daily.    . Omega-3 Fatty Acids (FISH OIL) 1000 MG CAPS Take 1 capsule by mouth daily.    Marland Kitchen OVER THE COUNTER MEDICATION Take 2 tablets by  mouth daily. Focus Formula    . Soya Lecithin 1200 MG CAPS Take 1 tablet by mouth daily.      No current facility-administered medications on file prior to visit.     No Known Allergies  Past Medical History:  Diagnosis Date  . Arthritis   . Confusion   . Diabetes mellitus without complication ([place])   . Hepatitis   . Hypertension   . Lumbar stenosis   . Memory loss   . Nocturia   . Numbness and tingling    legs and feet  . Wears glasses     Past Surgical History:  Procedure Laterality Date  . [place]  . COLONOSCOPY    . HEMORRHOID SURGERY  2015  . TRANSFORAMINAL LUMBAR INTERBODY FUSION (TLIF) WITH PEDICLE SCREW FIXATION 2 LEVEL N/A 11/07/2015   Procedure: Lumbar three-four Lumbar four-five  transforaminal lumbar interbody fusion with interbody prosthesis posterior lateral arthrodesis and posterior segmental instrumentation;  Surgeon: Newman Pies, MD;  Location: [facility];  Service: Neurosurgery;  Laterality: N/A;    Family History  Problem Relation Age of Onset  . Hypertension Other   . Cancer Other     ovarian  . Cancer Mother   . Heart disease Father     Social  History   Social History  . Marital status: Divorced    Spouse name: N/A  . Number of children: 4  . Years of education: N/A   Occupational History  . Not on file.   Social History Main Topics  . Smoking status: Never Smoker  . Smokeless tobacco: Never Used  . Alcohol use No     Comment: rarely  . Drug use: No  . Sexual activity: Not on file   Other Topics Concern  . Not on file   Social History Narrative   Has a living will.   Son is health care POA   Would accept resuscitation.  Would not want prolonged life support if futile.   Probably would not want tube feeds if cognitively unaware   Review of Systems Appetite is good Weight is stable No N/V    Objective:   Physical Exam  Abdominal: Soft. She exhibits no distension. There is no tenderness. There is no  rebound and no guarding.  Genitourinary:  Genitourinary Comments: Small non inflamed hemorrhoid No rectal masses Stool brown and heme negative          Assessment & Plan:

## 2016-09-16 ENCOUNTER — Other Ambulatory Visit (INDEPENDENT_AMBULATORY_CARE_PROVIDER_SITE_OTHER): Payer: Medicare Other

## 2016-09-16 DIAGNOSIS — K625 Hemorrhage of anus and rectum: Secondary | ICD-10-CM

## 2016-09-16 LAB — FECAL OCCULT BLOOD, IMMUNOCHEMICAL: FECAL OCCULT BLD: NEGATIVE

## 2016-09-19 ENCOUNTER — Encounter: Payer: Self-pay | Admitting: Internal Medicine

## 2016-09-20 ENCOUNTER — Encounter: Payer: Self-pay | Admitting: Internal Medicine

## 2016-09-22 MED ORDER — CINNAMON 500 MG PO CAPS: 500.0000 mg | ORAL_CAPSULE | Freq: Two times a day (BID) | ORAL | 3 refills | Status: DC

## 2016-09-22 MED ORDER — CVS SENIOR PROBIOTIC PO CAPS: 1.0000 | ORAL_CAPSULE | Freq: Two times a day (BID) | ORAL | 3 refills | Status: DC

## 2016-09-22 NOTE — Telephone Encounter (Signed)
Okay to send prescription for this for a year

## 2016-09-22 NOTE — Telephone Encounter (Signed)
Okay to send a prescription for this for a year

## 2016-09-26 ENCOUNTER — Other Ambulatory Visit: Payer: Self-pay | Admitting: Internal Medicine

## 2016-10-16 DIAGNOSIS — E119 Type 2 diabetes mellitus without complications: Secondary | ICD-10-CM | POA: Diagnosis not present

## 2016-10-16 LAB — HM DIABETES EYE EXAM

## 2016-10-20 ENCOUNTER — Other Ambulatory Visit: Payer: Self-pay | Admitting: Neurosurgery

## 2016-10-20 DIAGNOSIS — G8929 Other chronic pain: Secondary | ICD-10-CM

## 2016-10-20 DIAGNOSIS — M545 Low back pain: Principal | ICD-10-CM

## 2016-10-24 ENCOUNTER — Other Ambulatory Visit: Payer: Self-pay | Admitting: Internal Medicine

## 2016-10-28 ENCOUNTER — Ambulatory Visit
Admission: RE | Admit: 2016-10-28 | Discharge: 2016-10-28 | Disposition: A | Discharge status: Home / Self Care | Payer: Medicare Other | Source: Ambulatory Visit | Attending: Neurosurgery | Admitting: Neurosurgery

## 2016-10-28 VITALS — BP 136/59 | HR 65

## 2016-10-28 DIAGNOSIS — M545 Low back pain: Principal | ICD-10-CM

## 2016-10-28 DIAGNOSIS — G8929 Other chronic pain: Secondary | ICD-10-CM

## 2016-10-28 DIAGNOSIS — M5126 Other intervertebral disc displacement, lumbar region: Secondary | ICD-10-CM | POA: Diagnosis not present

## 2016-10-28 DIAGNOSIS — M4316 Spondylolisthesis, lumbar region: Secondary | ICD-10-CM

## 2016-10-28 MED ORDER — IOPAMIDOL (ISOVUE-M 200) INJECTION 41%
15.0000 mL | Freq: Once | INTRAMUSCULAR | Status: AC
  Administered 2016-10-28: 15 mL via INTRATHECAL

## 2016-10-28 MED ORDER — DIAZEPAM 5 MG PO TABS
5.0000 mg | ORAL_TABLET | Freq: Once | ORAL | Status: AC
  Administered 2016-10-28: 5 mg via ORAL

## 2016-10-28 NOTE — Discharge Instructions (Signed)
Myelogram Discharge Instructions  1. Go home and rest quietly for the next 24 hours.  It is important to lie flat for the next 24 hours.  Get up only to go to the restroom.  You may lie in the bed or on a couch on your back, your stomach, your left side or your right side.  You may have one pillow under your head.  You may have pillows between your knees while you are on your side or under your knees while you are on your back.  2. DO NOT drive today.  Recline the seat as far back as it will go, while still wearing your seat belt, on the way home.  3. You may get up to go to the bathroom as needed.  You may sit up for 10 minutes to eat.  You may resume your normal diet and medications unless otherwise indicated.  Drink lots of extra fluids today and tomorrow.  4. The incidence of headache, nausea, or vomiting is about 5% (one in 20 patients).  If you develop a headache, lie flat and drink plenty of fluids until the headache goes away.  Caffeinated beverages may be helpful.  If you develop severe nausea and vomiting or a headache that does not go away with flat bed rest, call [phone].  5. You may resume normal activities after your 24 hours of bed rest is over; however, do not exert yourself strongly or do any heavy lifting tomorrow. If when you get up you have a headache when standing, go back to bed and force fluids for another 24 hours.  6. Call your physician for a follow-up appointment.  The results of your myelogram will be sent directly to your physician by the following day.  7. If you have any questions or if complications develop after you arrive home, please call [phone].  Discharge instructions have been explained to the patient.  The patient, or the person responsible for the patient, fully understands these instructions.       May resume Cymbalta on Feb. 21, 2018, after 1:00 pm.

## 2016-10-28 NOTE — Progress Notes (Signed)
Patient states she has been off Cymbalta for at least the past two days.  jkl 

## 2016-11-04 ENCOUNTER — Encounter: Payer: Self-pay | Admitting: Internal Medicine

## 2016-11-04 ENCOUNTER — Encounter: Payer: Self-pay | Admitting: Cardiovascular Disease

## 2016-11-04 ENCOUNTER — Ambulatory Visit (INDEPENDENT_AMBULATORY_CARE_PROVIDER_SITE_OTHER): Payer: Medicare Other | Admitting: Cardiovascular Disease

## 2016-11-04 VITALS — BP 122/82 | HR 91 | Ht 64.0 in | Wt 178.0 lb

## 2016-11-04 DIAGNOSIS — R9431 Abnormal electrocardiogram [ECG] [EKG]: Secondary | ICD-10-CM

## 2016-11-04 DIAGNOSIS — G8929 Other chronic pain: Secondary | ICD-10-CM | POA: Diagnosis not present

## 2016-11-04 DIAGNOSIS — M545 Low back pain, unspecified: Secondary | ICD-10-CM

## 2016-11-04 DIAGNOSIS — I10 Essential (primary) hypertension: Secondary | ICD-10-CM | POA: Diagnosis not present

## 2016-11-04 DIAGNOSIS — E1140 Type 2 diabetes mellitus with diabetic neuropathy, unspecified: Secondary | ICD-10-CM

## 2016-11-04 NOTE — Patient Instructions (Addendum)
Medication Instructions:   Ask Dr. Arnoldo Morale about cortisone injection Meloxicam/celebrex (naproxen/aleve)  Ok to try one blood pressure pill instead of two Monitor blood pressure at home   Labwork:  No new labs needed  Testing/Procedures:  No further testing at this time   I recommend watching educational videos on topics of interest to you at:       [email or web address]  Enter code: [email or web address]    Follow-Up: It was a pleasure seeing you in the office today. Please call us if you have new issues that need to be addressed before your next appt.  [phone]  Your physician wants you to follow-up in: 12 months.  You will receive a reminder letter in the mail two months in advance. If you don't receive a letter, please call our office to schedule the follow-up appointment.  If you need a refill on your cardiac medications before your next appointment, please call your pharmacy.

## 2016-11-04 NOTE — Progress Notes (Signed)
Cardiology Office Note  Date:  11/04/2016   ID:  Troyce, Febo 1945-11-14, MRN [id number]  PCP:  Viviana Simpler, MD   Chief Complaint  Patient presents with  . other    12 month follow up. Meds reviewed by the pt. verbally. "doing well."     HPI:  [name] is a very pleasant 71 year old woman with history of chronic back pain, back surgery in 2017 , glucose intolerance/diabetes type 2, hypertension, previously seen for abnormal EKG and preop clearance, who presents for routine follow-up of her abnormal EKG, hypertension  In follow-up today she reports that she is Still having back pain, b/l SI joint Off gabapentin On cymbalta and Tylenol Reports that the combination is not working well for her Still not able to walk very far without pain, Able to sit without discomfort  Lab work reviewed with her showing HBA1C 5.8, total cholesterol 190 LDL 91  She has changed her diet radically Weight down 31 pounds in one year  Blood pressure low on today's visit and home checks  EKG on today's visit  reviewed personally by myself shows normal sinus rhythm with rate 93 bpm with right bundle branch block, LAFB. No change compared to EKG from 2017   Prior EKGs from 2014 showing no bundle branch block at that time   other past medical history reviewed  in 2017 reported fluttering  in her chest lasting for 10 seconds or so, happens several times per year otherwise feels well most of the time.  Denies any smoking history   PMH:   has a past medical history of Arthritis; Confusion; Diabetes mellitus without complication ([place]); Hepatitis; Hypertension; Lumbar stenosis; Memory loss; Nocturia; Numbness and tingling; and Wears glasses.  PSH:    Past Surgical History:  Procedure Laterality Date  . [place]  . COLONOSCOPY    . HEMORRHOID SURGERY  2015  . TRANSFORAMINAL LUMBAR INTERBODY FUSION (TLIF) WITH PEDICLE SCREW FIXATION 2 LEVEL N/A 11/07/2015   Procedure:  Lumbar three-four Lumbar four-five  transforaminal lumbar interbody fusion with interbody prosthesis posterior lateral arthrodesis and posterior segmental instrumentation;  Surgeon: Newman Pies, MD;  Location: [facility];  Service: Neurosurgery;  Laterality: N/A;    Current Outpatient Prescriptions  Medication Sig Dispense Refill  . acetaminophen (TYLENOL) 500 MG tablet Take 1,000 mg by mouth every 8 (eight) hours as needed.    . Ascorbic Acid (VITAMIN C WITH ROSE HIPS) 1000 MG tablet Take 1,000 mg by mouth daily.    . Blood Glucose Monitoring Suppl (ACCU-CHEK AVIVA PLUS) w/Device KIT Use to test blood sugar once daily Dx: R73.03 1 kit 0  . Cinnamon ([employer] CINNAMON) 500 MG capsule Take 1 capsule (500 mg total) by mouth 2 (two) times daily. 180 capsule 3  . Coenzyme Q10 (CO Q 10 PO) Take 1 capsule by mouth daily.    . cyanocobalamin 500 MCG tablet Take 500 mcg by mouth daily.    . DULoxetine (CYMBALTA) 60 MG capsule TAKE 1 CAPSULE (60 MG TOTAL) BY MOUTH DAILY. 30 capsule 5  . glucose blood (ACCU-CHEK AVIVA PLUS) test strip Use to test blood sugar once daily Dx: R73.03 100 each 1  . hydrocortisone 2.5 % cream Apply topically 3 (three) times daily as needed. 28 g 3  . Lancets (ACCU-CHEK SOFT TOUCH) lancets Use as instructed 100 each 12  . lisinopril-hydrochlorothiazide (PRINZIDE,ZESTORETIC) 20-25 MG tablet TAKE 2 TABLETS BY MOUTH DAILY 180 tablet 3  . metFORMIN (GLUCOPHAGE-XR) 500 MG  24 hr tablet TAKE 1 TABLET (500 MG TOTAL) BY MOUTH DAILY WITH BREAKFAST. 90 tablet 3  . Multiple Vitamins-Minerals (MULTIVITAMIN WITH MINERALS) tablet Take 1 tablet by mouth daily.     . NON FORMULARY Calcium 250 mg+ Magnesium 250 mg + Potassium 45 mg Takes 2 tablets daily.    . Omega-3 Fatty Acids (FISH OIL) 1000 MG CAPS Take 1 capsule by mouth daily.    Marland Kitchen OVER THE COUNTER MEDICATION Take 2 tablets by mouth daily. Focus Formula    . Probiotic Product ([employer] SENIOR PROBIOTIC) CAPS Take 1 capsule by mouth 2 (two)  times daily. 180 capsule 3  . Soya Lecithin 1200 MG CAPS Take 1 tablet by mouth daily.      No current facility-administered medications for this visit.      Allergies:   Patient has no known allergies.   Social History:  The patient  reports that she has never smoked. She has never used smokeless tobacco. She reports that she does not drink alcohol or use drugs.   Family History:   family history includes Cancer in her mother and other; Heart disease in her father; Hypertension in her other.    Review of Systems: Review of Systems  Constitutional: Negative.   Respiratory: Negative.   Cardiovascular: Negative.   Gastrointestinal: Negative.   Musculoskeletal: Positive for back pain.  Neurological: Negative.   Psychiatric/Behavioral: Negative.   All other systems reviewed and are negative.    PHYSICAL EXAM: VS:  BP 122/82 (BP Location: Left Arm, Patient Position: Sitting, Cuff Size: Normal)   Pulse 91   Ht '5\' 4"'  (1.626 m)   Wt 178 lb (80.7 kg)   BMI 30.55 kg/m  , BMI Body mass index is 30.55 kg/m. GEN: Well nourished, well developed, in no acute distress , obese HEENT: normal  Neck: no JVD, carotid bruits, or masses Cardiac: RRR; no murmurs, rubs, or gallops,no edema  Respiratory:  clear to auscultation bilaterally, normal work of breathing GI: soft, nontender, nondistended, + BS MS: no deformity or atrophy  Skin: warm and dry, no rash Neuro:  Strength and sensation are intact Psych: euthymic mood, full affect    Recent Labs: 11/08/2015: Hemoglobin 12.9; Platelets 194 03/31/2016: ALT 18; BUN 20; Creatinine, Ser 1.03; Potassium 3.8; Pro B Natriuretic peptide (BNP) 256.0; Sodium 140    Lipid Panel Lab Results  Component Value Date   CHOL 194 07/17/2016   HDL 67.90 07/17/2016   LDLCALC 91 07/17/2016   TRIG 177.0 (H) 07/17/2016      Wt Readings from Last 3 Encounters:  11/04/16 178 lb (80.7 kg)  09/02/16 186 lb (84.4 kg)  08/06/16 181 lb (82.1 kg)        ASSESSMENT AND PLAN:  Essential hypertension, benign - Plan: EKG 12-Lead Given recent 30 pound weight loss, likely can cut back on some of her blood pressure medication. Suggested she try one of the lisinopril HCTZ pills with close monitoring of blood pressure. Suggested she call us with numbers  Type 2 diabetes, controlled, with neuropathy ([place]) - Plan: EKG 12-Lead Dramatic improvement in her hemoglobin A1c with 30 pound weight loss over the past year  Chronic bilateral low back pain without sciatica Long discussion concerning her back pain Suggested she talk with Dr. Arnoldo Morale Unclear if she would be a candidate for cortisone Even possible medication changes such as Celebrex/meloxicam Recommended regular exercise, range of motion therapy  Abnormal EKG Right bundle branch block, left anterior fascicular block No significant change in  EKG in the past year Asymptomatic, will continue to monitor for now   Total encounter time more than 25 minutes  Greater than 50% was spent in counseling and coordination of care with the patient   Disposition:   F/U  12 months   Orders Placed This Encounter  Procedures  . EKG 12-Lead     Signed, Esmond Plants, M.D., Ph.D. 11/04/2016  [facility], [place]

## 2016-11-05 ENCOUNTER — Ambulatory Visit: Payer: Self-pay | Admitting: Cardiovascular Disease

## 2016-11-05 LAB — HM DIABETES EYE EXAM

## 2016-11-05 MED ORDER — MELOXICAM 7.5 MG PO TABS: 7.5000 mg | ORAL_TABLET | Freq: Two times a day (BID) | ORAL | 1 refills | Status: DC | PRN

## 2016-11-14 ENCOUNTER — Encounter: Payer: Self-pay | Admitting: Internal Medicine

## 2016-11-14 IMAGING — RF DG LUMBAR SPINE 2-3V
1 series · 3 of 3 positions shown · non-contrast
Comparison: Intraoperative localization film same day

CLINICAL DATA: Lumbar fusion L3-L4 and L4-L5 level

EXAM:
LUMBAR SPINE - 2-3 VIEW; DG C-ARM 61-120 MIN

[Series 1: run · 3 of 3 slices shown]
[im 1/3]
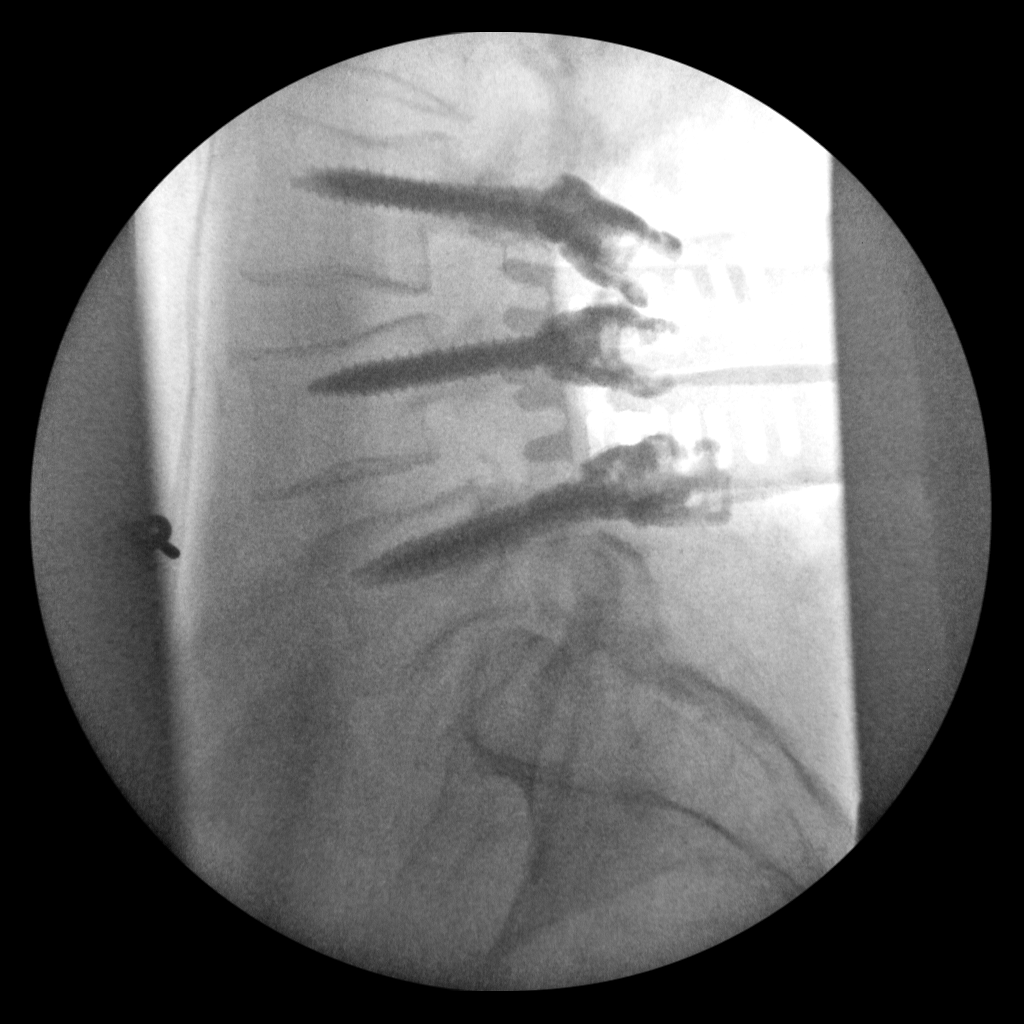
[im 2/3]
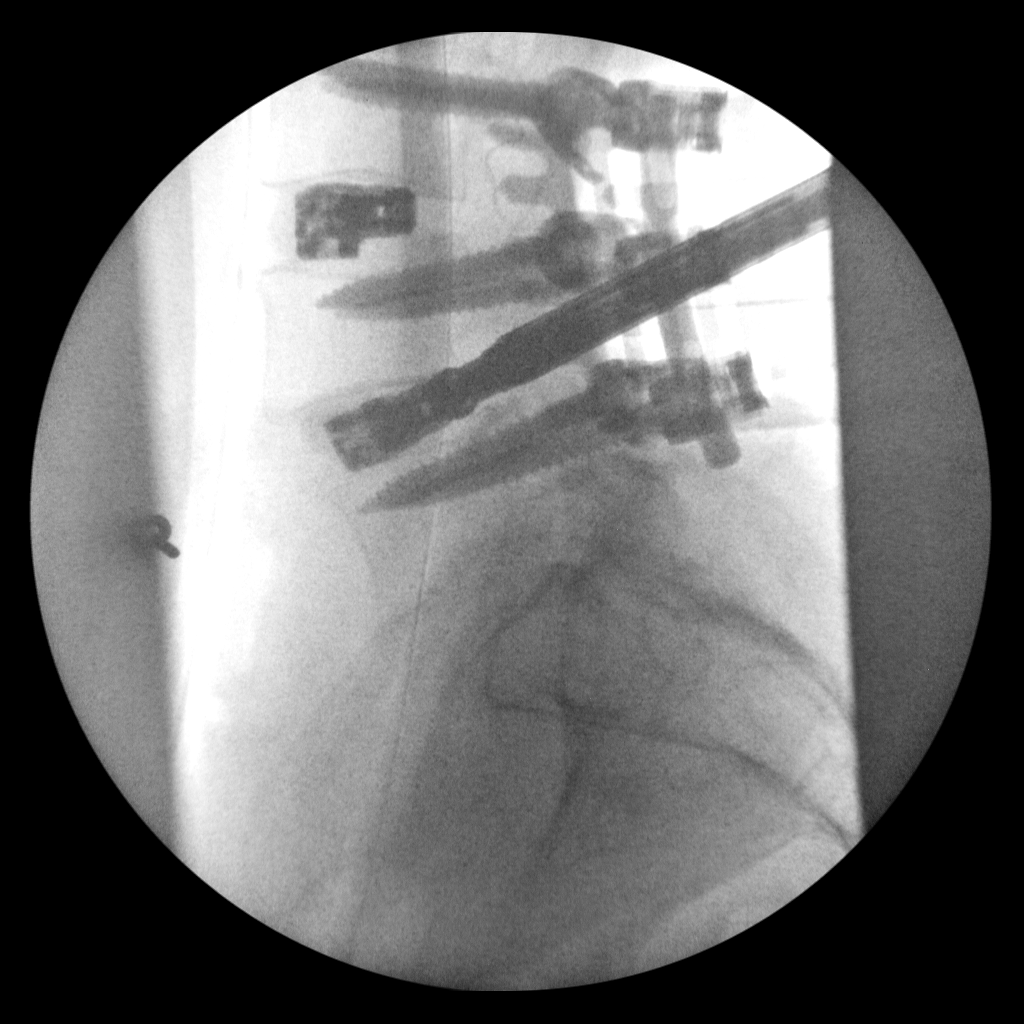
[im 3/3]
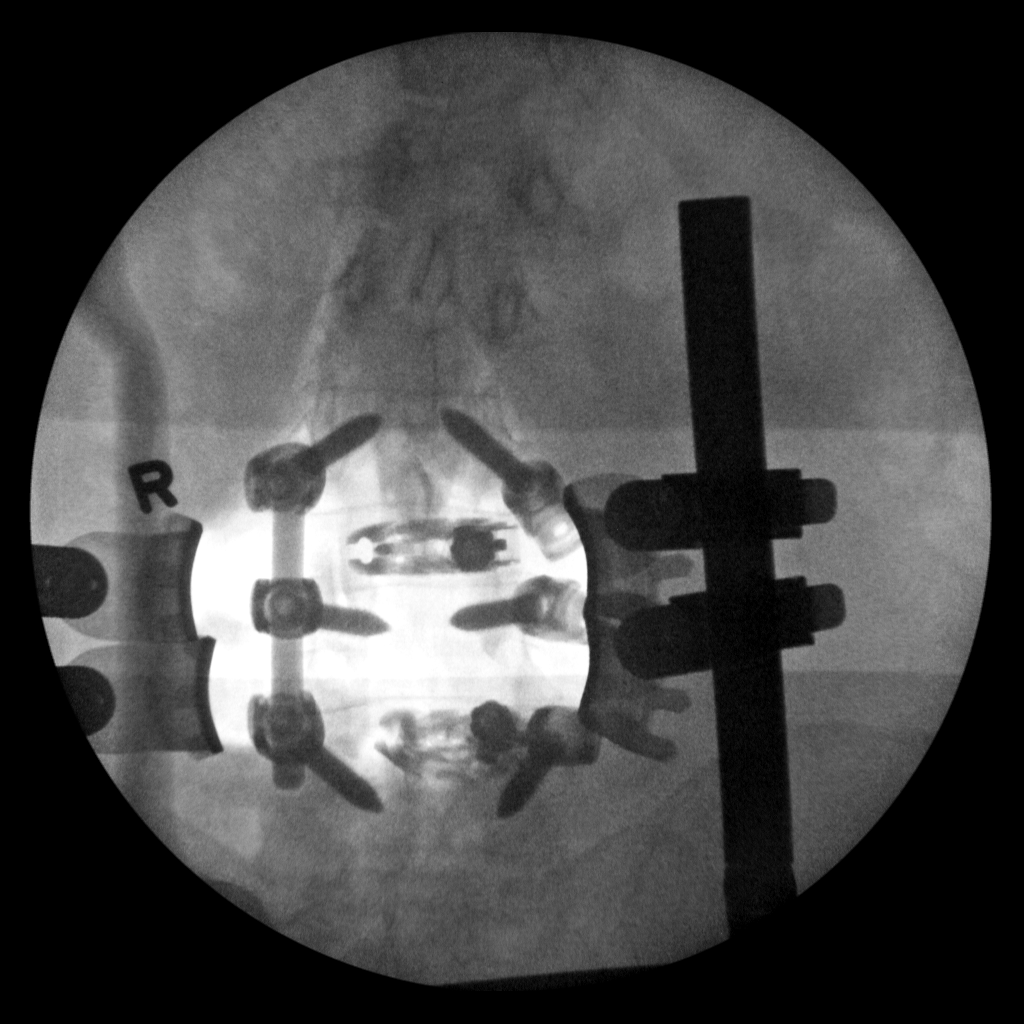

[3 of 3 positions shown; findings below may reference images not displayed]

FINDINGS: Three views of the lumbar spine submitted. Posterior transpedicular
metallic screws are noted at L3, L4 and L5 level. The alignment is
preserved. Persistent mild anterolisthesis L4 on L5. Postsurgical
disc spacer material noted at L3-L4 and L4-L5 level.
IMPRESSION: Posterior transpedicular metallic screws are noted at L3, L4 and L5
level. The alignment is preserved. Persistent mild anterolisthesis
L4 on L5. Postsurgical disc spacer material noted at L3-L4 and L4-L5
level.

Please see the operative report.

Fluoroscopy time 43 seconds.

## 2016-11-14 IMAGING — CR DG LUMBAR SPINE 1V
1 series · 1 of 1 positions shown · non-contrast
Comparison: MRI lumbar spine 05/21/2015.

CLINICAL DATA: Intraoperative localization film patient for L3-4
and L4-5 fusion.

EXAM:
LUMBAR SPINE - 1 VIEW

[lat]
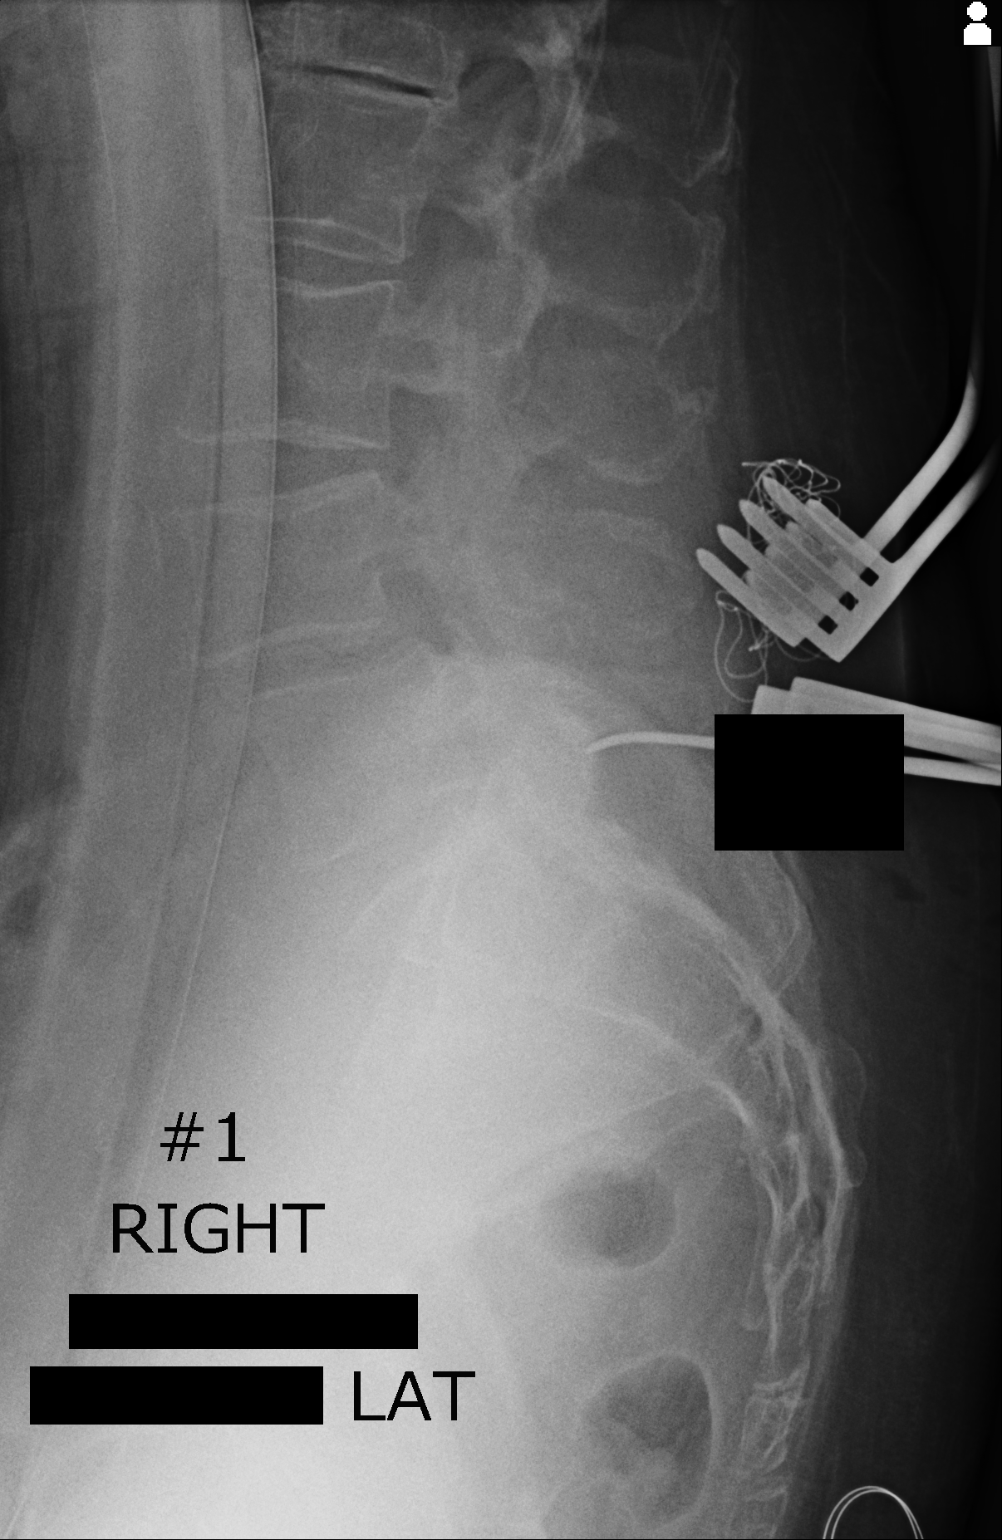

[1 of 1 positions shown; findings below may reference images not displayed]

FINDINGS: A single intraoperative view of the lumbar spine in the lateral
projection is provided. Image demonstrates a probe at the L5-S1
level.
IMPRESSION: As above.

## 2016-11-17 ENCOUNTER — Encounter: Payer: Self-pay | Admitting: Internal Medicine

## 2016-11-20 ENCOUNTER — Encounter: Payer: Self-pay | Admitting: Internal Medicine

## 2016-11-21 ENCOUNTER — Encounter: Payer: Self-pay | Admitting: Internal Medicine

## 2016-11-24 ENCOUNTER — Encounter: Payer: Self-pay | Admitting: Internal Medicine

## 2016-11-25 ENCOUNTER — Telehealth: Payer: Self-pay | Admitting: Internal Medicine

## 2016-11-25 NOTE — Telephone Encounter (Signed)
Pt is calling about a bill she is getting for a no show fee.  It has now been sent to collection agency.  She states that she had 2 appointments made and was under the impression that the 2nd appointmentt was cancelled.  She brought her bill in to the office and talked to provider about it, and states Carrie/Terri? Gave her a thumbs up.  cb number is [phone] Thanks

## 2016-11-28 ENCOUNTER — Encounter: Payer: Self-pay | Admitting: Internal Medicine

## 2016-11-28 ENCOUNTER — Ambulatory Visit (INDEPENDENT_AMBULATORY_CARE_PROVIDER_SITE_OTHER): Payer: Medicare Other | Admitting: Internal Medicine

## 2016-11-28 DIAGNOSIS — M48062 Spinal stenosis, lumbar region with neurogenic claudication: Secondary | ICD-10-CM | POA: Diagnosis not present

## 2016-11-28 MED ORDER — DULOXETINE HCL 60 MG PO CPEP: 60.0000 mg | ORAL_CAPSULE | Freq: Two times a day (BID) | ORAL | 5 refills | Status: DC

## 2016-11-28 NOTE — Patient Instructions (Signed)
Please double the duloxetine to 60mg  twice a day. Let's see if physical therapy will help you again.

## 2016-11-28 NOTE — Assessment & Plan Note (Signed)
Bad pain has recurred since surgery about a year ago Doesn't seem to be her hip, a disc or any other etiology Will try increasing the duloxetine to 60 bid PT evaluation

## 2016-11-28 NOTE — Progress Notes (Signed)
Pre visit review using our clinic review tool, if applicable. No additional management support is needed unless otherwise documented below in the visit note. 

## 2016-11-28 NOTE — Progress Notes (Signed)
   Subjective:    Patient ID: [name], female    DOB: 06-24-1946, 71 y.o.   MRN: [id number]  HPI Here due to ongoing leg pain  Pain is "excrutiating" on left side---hip and calf (skips thigh) Limits her ability to walk Trouble sleeping due to the pain---had been taking tylenol PM and not helping now (even with extra one) Some mild discomfort now even with sitting Has to hold weight on shopping cart to reduce the pain when in store  Legs are not weak Pain is worse if she stretches left side--even moving her arm up Also has tingling around right hip  meloxicam no help Duloxetine some help    Review of Systems     Objective:   Physical Exam  Constitutional: No distress.  Musculoskeletal:  No spine tenderness SLR negative Fair ROM in right hip--only mildly reduced internal rotation  Neurological:  Antalgic gait No focal leg weakness          Assessment & Plan:

## 2016-12-02 ENCOUNTER — Encounter: Payer: Self-pay | Admitting: Internal Medicine

## 2016-12-08 ENCOUNTER — Ambulatory Visit: Payer: Medicare Other | Attending: Internal Medicine

## 2016-12-08 DIAGNOSIS — M48061 Spinal stenosis, lumbar region without neurogenic claudication: Secondary | ICD-10-CM | POA: Diagnosis not present

## 2016-12-08 DIAGNOSIS — R293 Abnormal posture: Secondary | ICD-10-CM | POA: Diagnosis not present

## 2016-12-08 DIAGNOSIS — M6281 Muscle weakness (generalized): Secondary | ICD-10-CM | POA: Diagnosis not present

## 2016-12-08 DIAGNOSIS — M62830 Muscle spasm of back: Secondary | ICD-10-CM | POA: Insufficient documentation

## 2016-12-08 DIAGNOSIS — R262 Difficulty in walking, not elsewhere classified: Secondary | ICD-10-CM | POA: Insufficient documentation

## 2016-12-08 NOTE — Therapy (Signed)
[place], [place], [address] Phone: [phone]   Fax:  [phone]  Physical Therapy Evaluation  Patient Details  Name: [name] MRN: [id number] Date of Birth: Oct 17, 1945 Referring Provider: Viviana Simpler, MD  Encounter Date: 12/08/2016      PT End of Session - 12/08/16 1502    Visit Number 1   Number of Visits 12   Date for PT Re-Evaluation 01/16/17   Authorization Type U[employer] MCR   PT Start Time 0215   PT Stop Time 0300   PT Time Calculation (min) 45 min   Activity Tolerance Patient tolerated treatment well;No increased pain   Behavior During Therapy WFL for tasks assessed/performed      Past Medical History:  Diagnosis Date  . Arthritis   . Confusion   . Diabetes mellitus without complication ([place])   . Hepatitis   . Hypertension   . Lumbar stenosis   . Memory loss   . Nocturia   . Numbness and tingling    legs and feet  . Wears glasses     Past Surgical History:  Procedure Laterality Date  . [place]  . COLONOSCOPY    . HEMORRHOID SURGERY  2015  . TRANSFORAMINAL LUMBAR INTERBODY FUSION (TLIF) WITH PEDICLE SCREW FIXATION 2 LEVEL N/A 11/07/2015   Procedure: Lumbar three-four Lumbar four-five  transforaminal lumbar interbody fusion with interbody prosthesis posterior lateral arthrodesis and posterior segmental instrumentation;  Surgeon: Newman Pies, MD;  Location: [facility];  Service: Neurosurgery;  Laterality: N/A;    There were no vitals filed for this visit.       Subjective Assessment - 12/08/16 1421    Subjective She reports  she has back pain with pain into LT leg. She was her for PT in past but not sure. She had Lumbar surgery and now pain ok but pain in leg and hip LT .    Pertinent History L3-4 and 4-5 fusion.    Limitations Walking;Standing;House hold activities  sleep disturbed   How long can you sit comfortably? As needed   How long can you  stand comfortably? 3 min   How long can you walk comfortably? Pain onset immmediately on walking   Diagnostic tests mylogram: negative   Patient Stated Goals She wants to have decreased pain    Currently in Pain? Yes   Pain Score 3   sitting , at worse on feet 9/10   Pain Location Hip   Pain Orientation Left   Pain Descriptors / Indicators --  terrible /awful/ discussing   Pain Type Chronic pain   Pain Radiating Towards LT leg to ankle laterally, no thigh pain   Pain Onset More than a month ago   Pain Frequency Intermittent   Aggravating Factors  standing /walking/ lying flat   Pain Relieving Factors sitting and meds   Multiple Pain Sites No            [facility] PT Assessment - 12/08/16 0001      Assessment   Medical Diagnosis Lumbar spinal stenosis   Referring Provider Viviana Simpler, MD   Onset Date/Surgical Date --  leg pain for 3 years   Next MD Visit 01/2017   Prior Therapy Not sure     Precautions   Precautions None     Restrictions   Weight Bearing Restrictions No     Balance Screen   Has the patient fallen in the past 6 months No  Has the patient had a decrease in activity level because of a fear of falling?  Yes  due to pain   Is the patient reluctant to leave their home because of a fear of falling?  Yes     Prior Function   Level of Independence Independent  yard work she does not do     Charity fundraiser Status Within Functional Limits for tasks assessed     Observation/Other Assessments   Focus on Therapeutic Outcomes (FOTO)  46% limited     Posture/Postural Control   Posture Comments Flexed and hips and back with decreased lumbar lordosis and scoliosis       ROM / Strength   AROM / PROM / Strength AROM;Strength     AROM   AROM Assessment Site Lumbar   Lumbar Flexion 70   Lumbar Extension 0   Lumbar - Right Side Bend 5   Lumbar - Left Side Bend 5   Lumbar - Right Rotation 30   Lumbar - Left Rotation 30     Strength   Overall  Strength Comments LE WNL , abdominals poor.      Flexibility   Soft Tissue Assessment /Muscle Length yes   Hamstrings WNL with hips flexed , increased painLR leg  with RT SLR and left leg to pain ful to lift.      Palpation   Palpation comment tender gluteals bilaterally     Ambulation/Gait   Gait Comments Walks flexed in hips and trunk, antagic , slow pace                            PT Education - 12/08/16 1502    Education provided Yes   Education Details POC Educated with spine model possible piriformis irritation , HEP   Person(s) Educated Patient   Methods Explanation;Tactile cues;Verbal cues;Handout   Comprehension Returned demonstration;Verbalized understanding          PT Short Term Goals - 12/08/16 1535      PT SHORT TERM GOAL #1   Title She will be independent with inital HEp    Time 3   Period Weeks     PT SHORT TERM GOAL #2   Title She will report pain decr so able to stand > 5 min for home tasks   Time 3   Period Weeks   Status New     PT SHORT TERM GOAL #3   Title She will report able to walk in home 50-100 feet before pain increases   Time 3   Period Weeks   Status New           PT Long Term Goals - 12/08/16 1537      PT LONG TERM GOAL #1   Title she will be independne t with all hEP issued    Time 6   Period Weeks   Status New     PT LONG TERM GOAL #2   Title she will report able to stand for 5 min with pain 2-3/10  max pain   Time 6   Period Weeks   Status New     PT LONG TERM GOAL #3   Title She will report able to walk in home with pain 2-3 max pain.    Time 6   Period Weeks   Status New     PT LONG TERM GOAL #4   Title She will be able to  grocery shop with 6/10 max pain   Time 6   Period Weeks   Status New               Plan - December 21, 2016 1503    Clinical Impression Statement Ms Ladoris Gene presents for moderate complexity eval with chronic pain due to lumbar stenosis. She demo flexe posture and  increased pain with stretching of nerve with SLr and  extended postions . Manual LT leg traction eased pain but upon standing she immediately had return of pain.    Rehab Potential Good   Clinical Impairments Affecting Rehab Potential surgery, chronicity of problem   PT Frequency 2x / week   PT Duration 6 weeks   Consulted and Agree with Plan of Care Patient      Patient will benefit from skilled therapeutic intervention in order to improve the following deficits and impairments:  Decreased range of motion, Difficulty walking, Pain, Postural dysfunction, Decreased strength, Decreased activity tolerance, Increased muscle spasms  Visit Diagnosis: Spinal stenosis of lumbar region, unspecified whether neurogenic claudication present  Abnormal posture  Difficulty in walking, not elsewhere classified  Muscle spasm of back  Muscle weakness (generalized)      G-Codes - 2016/12/21 1641    Functional Assessment Tool Used (Outpatient Only) FOTO 46% limited   Functional Limitation Mobility: Walking and moving around   Mobility: Walking and Moving Around Goal Status [phone]) At least 40 percent but less than 60 percent impaired, limited or restricted   Mobility: Walking and Moving Around Discharge Status [phone]) At least 20 percent but less than 40 percent impaired, limited or restricted       Problem List Patient Active Problem List   Diagnosis Date Noted  . Rectal bleeding 09/02/2016  . Right hip pain 08/06/2016  . MDD (major depressive disorder), single episode, moderate ([place]) 05/07/2016  . Tick bite of groin 02/20/2016  . Advance directive discussed with patient 01/15/2016  . Spinal stenosis of lumbar region with neurogenic claudication 11/07/2015  . Chronic back pain 03/29/2015  . Preventative health care 01/10/2015  . Osteoarthritis, hip, bilateral 11/28/2014  . Peripheral neuropathy ([place]) 11/28/2014  . Type 2 diabetes, controlled, with neuropathy ([place]) 01/20/2014  . Episodic  mood disorder ([place]) 01/20/2014  . Memory loss 12/26/2013  . Bleeding hemorrhoid 11/07/2013  . IBS (irritable bowel syndrome) 12/27/2012  . Essential hypertension, benign 08/06/2010    Darrel Hoover  PT 12-21-2016, 4:42 PM  [facility] [address] [place], [place], [address] Phone: [phone]   Fax:  [phone]  Name: FERN CANOVA MRN: [id number] Date of Birth: 1945-09-23

## 2016-12-08 NOTE — Patient Instructions (Signed)
From cabinet  He issued PPT and knee to chest for home 3-4x/day 3-10 reps x 5-30 sec hold

## 2016-12-11 ENCOUNTER — Ambulatory Visit: Payer: Medicare Other

## 2016-12-11 DIAGNOSIS — M48061 Spinal stenosis, lumbar region without neurogenic claudication: Secondary | ICD-10-CM

## 2016-12-11 DIAGNOSIS — R293 Abnormal posture: Secondary | ICD-10-CM

## 2016-12-11 DIAGNOSIS — M6281 Muscle weakness (generalized): Secondary | ICD-10-CM | POA: Diagnosis not present

## 2016-12-11 DIAGNOSIS — M62830 Muscle spasm of back: Secondary | ICD-10-CM | POA: Diagnosis not present

## 2016-12-11 DIAGNOSIS — R262 Difficulty in walking, not elsewhere classified: Secondary | ICD-10-CM | POA: Diagnosis not present

## 2016-12-11 NOTE — Therapy (Signed)
[place], [place], [address] Phone: [phone]   Fax:  [phone]  Physical Therapy Treatment  Patient Details  Name: [name] MRN: [id number] Date of Birth: 04/14/1946 Referring Provider: Viviana Simpler, MD  Encounter Date: 12/11/2016      PT End of Session - 12/11/16 1109    Visit Number 2   Number of Visits 12   Date for PT Re-Evaluation 01/16/17   Authorization Type UHC MCR   PT Start Time 1100   PT Stop Time 1143   PT Time Calculation (min) 43 min   Activity Tolerance Patient tolerated treatment well;No increased pain   Behavior During Therapy WFL for tasks assessed/performed      Past Medical History:  Diagnosis Date  . Arthritis   . Confusion   . Diabetes mellitus without complication ([place])   . Hepatitis   . Hypertension   . Lumbar stenosis   . Memory loss   . Nocturia   . Numbness and tingling    legs and feet  . Wears glasses     Past Surgical History:  Procedure Laterality Date  . [place]  . COLONOSCOPY    . HEMORRHOID SURGERY  2015  . TRANSFORAMINAL LUMBAR INTERBODY FUSION (TLIF) WITH PEDICLE SCREW FIXATION 2 LEVEL N/A 11/07/2015   Procedure: Lumbar three-four Lumbar four-five  transforaminal lumbar interbody fusion with interbody prosthesis posterior lateral arthrodesis and posterior segmental instrumentation;  Surgeon: Newman Pies, MD;  Location: [facility];  Service: Neurosurgery;  Laterality: N/A;    There were no vitals filed for this visit.      Subjective Assessment - 12/11/16 1110    Subjective Did exercises on floor but more painful than on sofa.  Bad this AM so took some asprin   Currently in Pain? Yes   Pain Score 5    Pain Location Hip   Pain Orientation Left   Pain Descriptors / Indicators --  terrible   Pain Type Chronic pain                         [facility] Adult PT Treatment/Exercise - 12/11/16 0001       Exercises   Exercises Lumbar     Lumbar Exercises: Stretches   Single Knee to Chest Stretch 2 reps;30 seconds   Lower Trunk Rotation Limitations 15 reps RT/LT  legs on red T ball     Lumbar Exercises: Supine   Ab Set 15 reps   Glut Set 15 reps   Clam 20 reps   Clam Limitations green band   Bent Knee Raise 15 reps   Bent Knee Raise Limitations RT/LT   Bridge 15 reps   Bridge Limitations both legs on red ball      Modalities   Modalities Electrical Stimulation;Moist Heat     Moist Heat Therapy   Number Minutes Moist Heat 20 Minutes   Moist Heat Location Lumbar Spine     Electrical Stimulation   Electrical Stimulation Location Lumbar   Electrical Stimulation Action IFC   Electrical Stimulation Parameters L12    Electrical Stimulation Goals Pain     Manual Therapy   Manual Therapy Soft tissue mobilization;Passive ROM;Manual Traction   Manual Traction long axis pull to each leg x 30 sec x 5                   PT Short Term Goals - 12/08/16 1535  PT SHORT TERM GOAL #1   Title She will be independent with inital HEp    Time 3   Period Weeks     PT SHORT TERM GOAL #2   Title She will report pain decr so able to stand > 5 min for home tasks   Time 3   Period Weeks   Status New     PT SHORT TERM GOAL #3   Title She will report able to walk in home 50-100 feet before pain increases   Time 3   Period Weeks   Status New           PT Long Term Goals - 12/08/16 1537      PT LONG TERM GOAL #1   Title she will be independne t with all hEP issued    Time 6   Period Weeks   Status New     PT LONG TERM GOAL #2   Title she will report able to stand for 5 min with pain 2-3/10  max pain   Time 6   Period Weeks   Status New     PT LONG TERM GOAL #3   Title She will report able to walk in home with pain 2-3 max pain.    Time 6   Period Weeks   Status New     PT LONG TERM GOAL #4   Title She will be able to grocery shop with 6/10 max pain   Time 6    Period Weeks   Status New               Plan - 12/11/16 1109    Clinical Impression Statement Post session she was able to walk with more erect posture and less pain. She did well with exercise. Heat and stim on with exercises   PT Treatment/Interventions Moist Heat;Passive range of motion;Patient/family education;Manual techniques;Therapeutic exercise;Taping;Dry needling   PT Next Visit Plan Manual traction , flexion based exercises , manual, MHP/ES, Add to HEP   PT Home Exercise Plan PPT, knee to chest single   Consulted and Agree with Plan of Care Patient      Patient will benefit from skilled therapeutic intervention in order to improve the following deficits and impairments:  Decreased range of motion, Difficulty walking, Pain, Postural dysfunction, Decreased strength, Decreased activity tolerance, Increased muscle spasms  Visit Diagnosis: Spinal stenosis of lumbar region, unspecified whether neurogenic claudication present  Abnormal posture  Difficulty in walking, not elsewhere classified  Muscle spasm of back  Muscle weakness (generalized)     Problem List Patient Active Problem List   Diagnosis Date Noted  . Rectal bleeding 09/02/2016  . Right hip pain 08/06/2016  . MDD (major depressive disorder), single episode, moderate ([place]) 05/07/2016  . Tick bite of groin 02/20/2016  . Advance directive discussed with patient 01/15/2016  . Spinal stenosis of lumbar region with neurogenic claudication 11/07/2015  . Chronic back pain 03/29/2015  . Preventative health care 01/10/2015  . Osteoarthritis, hip, bilateral 11/28/2014  . Peripheral neuropathy ([place]) 11/28/2014  . Type 2 diabetes, controlled, with neuropathy ([place]) 01/20/2014  . Episodic mood disorder ([place]) 01/20/2014  . Memory loss 12/26/2013  . Bleeding hemorrhoid 11/07/2013  . IBS (irritable bowel syndrome) 12/27/2012  . Essential hypertension, benign 08/06/2010    Darrel Hoover  PT 12/11/2016,  11:46 AM  [facility] [address] [place], [place], [address] Phone: [phone]   Fax:  [phone]  Name: [name] MRN: [id number] Date of Birth: Sep 23, 1945

## 2016-12-15 ENCOUNTER — Ambulatory Visit: Payer: Medicare Other | Admitting: Physical Therapy

## 2016-12-15 ENCOUNTER — Encounter: Payer: Self-pay | Admitting: Physical Therapy

## 2016-12-15 DIAGNOSIS — M62830 Muscle spasm of back: Secondary | ICD-10-CM | POA: Diagnosis not present

## 2016-12-15 DIAGNOSIS — M6281 Muscle weakness (generalized): Secondary | ICD-10-CM | POA: Diagnosis not present

## 2016-12-15 DIAGNOSIS — M48061 Spinal stenosis, lumbar region without neurogenic claudication: Secondary | ICD-10-CM | POA: Diagnosis not present

## 2016-12-15 DIAGNOSIS — R293 Abnormal posture: Secondary | ICD-10-CM

## 2016-12-15 DIAGNOSIS — R262 Difficulty in walking, not elsewhere classified: Secondary | ICD-10-CM | POA: Diagnosis not present

## 2016-12-15 NOTE — Therapy (Signed)
[place], [place], [address] Phone: [phone]   Fax:  [phone]  Physical Therapy Treatment  Patient Details  Name: [name] MRN: [id number] Date of Birth: 12-26-1945 Referring Provider: Viviana Simpler, MD  Encounter Date: 12/15/2016      PT End of Session - 12/15/16 1309    Visit Number 3   Number of Visits 12   Date for PT Re-Evaluation 01/16/17   PT Start Time 1105   PT Stop Time 1212   PT Time Calculation (min) 67 min   Activity Tolerance Patient tolerated treatment well   Behavior During Therapy [facility] for tasks assessed/performed      Past Medical History:  Diagnosis Date  . Arthritis   . Confusion   . Diabetes mellitus without complication ([place])   . Hepatitis   . Hypertension   . Lumbar stenosis   . Memory loss   . Nocturia   . Numbness and tingling    legs and feet  . Wears glasses     Past Surgical History:  Procedure Laterality Date  . [place]  . COLONOSCOPY    . HEMORRHOID SURGERY  2015  . TRANSFORAMINAL LUMBAR INTERBODY FUSION (TLIF) WITH PEDICLE SCREW FIXATION 2 LEVEL N/A 11/07/2015   Procedure: Lumbar three-four Lumbar four-five  transforaminal lumbar interbody fusion with interbody prosthesis posterior lateral arthrodesis and posterior segmental instrumentation;  Surgeon: Newman Pies, MD;  Location: [facility];  Service: Neurosurgery;  Laterality: N/A;    There were no vitals filed for this visit.      Subjective Assessment - 12/15/16 1306    Subjective I have been doing the exercises.  Pain is about the same.   Currently in Pain? Yes   Pain Score 5    Pain Location Hip   Pain Orientation Left   Pain Descriptors / Indicators Tingling   Pain Type Chronic pain   Pain Radiating Towards ankle,  LT leg   Pain Frequency Intermittent   Aggravating Factors  lying flat, standing, walking straighter   Pain Relieving Factors supine, sitting,   walking bent over with hands on knees                         [facility] Adult PT Treatment/Exercise - 12/15/16 0001      Self-Care   Self-Care --  Anatomy  , stenosis, varoius questions     Lumbar Exercises: Stretches   Single Knee to Chest Stretch 5 reps   Single Knee to Chest Stretch Limitations 10 seconds   Double Knee to Chest Stretch 5 reps   Double Knee to Chest Stretch Limitations 1 X hands, 4 X feet on red ball 10 second holds   Pelvic Tilt 5 reps   Piriformis Stretch 5 reps;10 seconds   Piriformis Stretch Limitations RT non tight,  Left stiff     Lumbar Exercises: Standing   Other Standing Lumbar Exercises Standing right side at wall stretch arm up, 1 x 5 seconds due to increased pain     Lumbar Exercises: Supine   Ab Set 5 reps   Clam 10 reps   Clam Limitations cued for abdominal activation  manual resistance 10 X both,  single leg 5 X each monitored   Bent Knee Raise 5 reps   Bent Knee Raise Limitations cued for abdominal activation     Modalities   Modalities Electrical Stimulation;Moist Heat     Moist  Heat Therapy   Number Minutes Moist Heat 15 Minutes   Moist Heat Location Lumbar Spine  Concurrent with IFC,  sitting     Electrical Stimulation   Electrical Stimulation Location lumbar, gluteal,  calf   Electrical Stimulation Action IFC   Electrical Stimulation Parameters L22   Electrical Stimulation Goals Pain                PT Education - 12/15/16 1308    Education provided Yes   Education Details Anatomy, various qusstions   Person(s) Educated Patient   Methods Explanation;Demonstration   Comprehension Verbalized understanding          PT Short Term Goals - 12/08/16 1535      PT SHORT TERM GOAL #1   Title She will be independent with inital HEp    Time 3   Period Weeks     PT SHORT TERM GOAL #2   Title She will report pain decr so able to stand > 5 min for home tasks   Time 3   Period Weeks   Status New     PT  SHORT TERM GOAL #3   Title She will report able to walk in home 50-100 feet before pain increases   Time 3   Period Weeks   Status New           PT Long Term Goals - 12/08/16 1537      PT LONG TERM GOAL #1   Title she will be independne t with all hEP issued    Time 6   Period Weeks   Status New     PT LONG TERM GOAL #2   Title she will report able to stand for 5 min with pain 2-3/10  max pain   Time 6   Period Weeks   Status New     PT LONG TERM GOAL #3   Title She will report able to walk in home with pain 2-3 max pain.    Time 6   Period Weeks   Status New     PT LONG TERM GOAL #4   Title She will be able to grocery shop with 6/10 max pain   Time 6   Period Weeks   Status New               Plan - 12/15/16 1310    Clinical Impression Statement Pain varied supine and standing.  Flexion based exercises were the focus.  Patient had pain increas with standing stretch for scoliosis.   No new exercises given due to patient's many questions.    PT Next Visit Plan Manual traction , flexion based exercises , manual, MHP/ES, Add to HEP   PT Home Exercise Plan PPT, knee to chest single   Consulted and Agree with Plan of Care Patient      Patient will benefit from skilled therapeutic intervention in order to improve the following deficits and impairments:  Decreased range of motion, Difficulty walking, Pain, Postural dysfunction, Decreased strength, Decreased activity tolerance, Increased muscle spasms  Visit Diagnosis: Spinal stenosis of lumbar region, unspecified whether neurogenic claudication present  Abnormal posture  Difficulty in walking, not elsewhere classified  Muscle spasm of back  Muscle weakness (generalized)     Problem List Patient Active Problem List   Diagnosis Date Noted  . Rectal bleeding 09/02/2016  . Right hip pain 08/06/2016  . MDD (major depressive disorder), single episode, moderate ([place]) 05/07/2016  . Tick bite of groin  02/20/2016  . Advance directive discussed with patient 01/15/2016  . Spinal stenosis of lumbar region with neurogenic claudication 11/07/2015  . Chronic back pain 03/29/2015  . Preventative health care 01/10/2015  . Osteoarthritis, hip, bilateral 11/28/2014  . Peripheral neuropathy ([place]) 11/28/2014  . Type 2 diabetes, controlled, with neuropathy ([place]) 01/20/2014  . Episodic mood disorder ([place]) 01/20/2014  . Memory loss 12/26/2013  . Bleeding hemorrhoid 11/07/2013  . IBS (irritable bowel syndrome) 12/27/2012  . Essential hypertension, benign 08/06/2010    HARRIS,KAREN PTA 12/15/2016, 1:12 PM  [facility] [address] [place], [place], [address] Phone: [phone]   Fax:  [phone]  Name: [name] MRN: [id number] Date of Birth: 1946/09/07

## 2016-12-16 ENCOUNTER — Encounter: Payer: Self-pay | Admitting: Internal Medicine

## 2016-12-18 ENCOUNTER — Ambulatory Visit: Payer: Medicare Other

## 2016-12-18 DIAGNOSIS — R293 Abnormal posture: Secondary | ICD-10-CM | POA: Diagnosis not present

## 2016-12-18 DIAGNOSIS — M48061 Spinal stenosis, lumbar region without neurogenic claudication: Secondary | ICD-10-CM | POA: Diagnosis not present

## 2016-12-18 DIAGNOSIS — M6281 Muscle weakness (generalized): Secondary | ICD-10-CM | POA: Diagnosis not present

## 2016-12-18 DIAGNOSIS — M62830 Muscle spasm of back: Secondary | ICD-10-CM | POA: Diagnosis not present

## 2016-12-18 DIAGNOSIS — R262 Difficulty in walking, not elsewhere classified: Secondary | ICD-10-CM

## 2016-12-18 NOTE — Therapy (Signed)
[place], [place], [address] Phone: [phone]   Fax:  [phone]  Physical Therapy Treatment  Patient Details  Name: [name] MRN: [id number] Date of Birth: Apr 20, 1946 Referring Provider: Viviana Simpler, MD  Encounter Date: 12/18/2016      PT End of Session - 12/18/16 1217    Visit Number 4   Number of Visits 12   Date for PT Re-Evaluation 01/16/17   Authorization Type [employer] MCR   PT Start Time 1100   PT Stop Time 1145   PT Time Calculation (min) 45 min   Activity Tolerance Patient tolerated treatment well;No increased pain   Behavior During Therapy WFL for tasks assessed/performed      Past Medical History:  Diagnosis Date  . Arthritis   . Confusion   . Diabetes mellitus without complication ([place])   . Hepatitis   . Hypertension   . Lumbar stenosis   . Memory loss   . Nocturia   . Numbness and tingling    legs and feet  . Wears glasses     Past Surgical History:  Procedure Laterality Date  . [place]  . COLONOSCOPY    . HEMORRHOID SURGERY  2015  . TRANSFORAMINAL LUMBAR INTERBODY FUSION (TLIF) WITH PEDICLE SCREW FIXATION 2 LEVEL N/A 11/07/2015   Procedure: Lumbar three-four Lumbar four-five  transforaminal lumbar interbody fusion with interbody prosthesis posterior lateral arthrodesis and posterior segmental instrumentation;  Surgeon: Newman Pies, MD;  Location: [facility];  Service: Neurosurgery;  Laterality: N/A;    There were no vitals filed for this visit.      Subjective Assessment - 12/18/16 1107    Subjective She reports not noticably better .   She felt she has benefited form PT education with stretching.     Currently in Pain? Yes   Pain Score 3    Pain Location Hip   Pain Orientation Right;Left;Posterior   Pain Type Chronic pain   Pain Onset More than a month ago   Pain Frequency Intermittent  while in recliner   Aggravating Factors  stand   walk or stand erect   Pain Relieving Factors recliner, sit, wlaking bent over   Multiple Pain Sites No                         [facility] Adult PT Treatment/Exercise - 12/18/16 0001      Lumbar Exercises: Stretches   Single Knee to Chest Stretch 2 reps;20 seconds   Lower Trunk Rotation Limitations 2 reps 20 sec   Pelvic Tilt Limitations 15 reps 5 sec hold.    Piriformis Stretch 2 reps;20 seconds     Lumbar Exercises: Supine   Ab Set 15 reps;5 seconds   Glut Set 15 reps     Modalities   Modalities Traction     Moist Heat Therapy   Number Minutes Moist Heat --   Moist Heat Location --     Traction   Type of Traction Lumbar   Min (lbs) 15   Max (lbs) 60   Hold Time 60   Rest Time 15   Time 10     Manual Therapy   Manual Traction long axis pull to each leg x 30 sec x 5                   PT Short Term Goals - 12/18/16 1221      PT  SHORT TERM GOAL #1   Title She will be independent with inital HEp    Status Achieved     PT SHORT TERM GOAL #2   Title She will report pain decr so able to stand > 5 min for home tasks   Status On-going     PT SHORT TERM GOAL #3   Title She will report able to walk in home 50-100 feet before pain increases   Status On-going           PT Long Term Goals - 12/08/16 1537      PT LONG TERM GOAL #1   Title she will be independne t with all hEP issued    Time 6   Period Weeks   Status New     PT LONG TERM GOAL #2   Title she will report able to stand for 5 min with pain 2-3/10  max pain   Time 6   Period Weeks   Status New     PT LONG TERM GOAL #3   Title She will report able to walk in home with pain 2-3 max pain.    Time 6   Period Weeks   Status New     PT LONG TERM GOAL #4   Title She will be able to grocery shop with 6/10 max pain   Time 6   Period Weeks   Status New               Plan - 12/18/16 1217    Clinical Impression Statement She was walking more erect after session but as  she had little pain post traction walking 10-15 feet caused increased pain in back neccesatating flexion with hands on knee. for 30-60 sec. she then walked again more erect than on coming in to clinicl The benefit from treatment and mild though noticable but she feels she has not made any progress and she is probably correct. Will add some flexion stability exercises for home and try the lumbar support that infleaaes and distractes spine.  next visit   PT Treatment/Interventions Moist Heat;Passive range of motion;Patient/family education;Manual techniques;Therapeutic exercise;Taping;Dry needling   PT Next Visit Plan Manual traction , flexion based exercises , manual, MHP/ES, Add to HEP, mech traction if she wants , trial of lumab suppport with inflation   PT Home Exercise Plan PPT, knee to chest single   Consulted and Agree with Plan of Care Patient      Patient will benefit from skilled therapeutic intervention in order to improve the following deficits and impairments:  Decreased range of motion, Difficulty walking, Pain, Postural dysfunction, Decreased strength, Decreased activity tolerance, Increased muscle spasms  Visit Diagnosis: Spinal stenosis of lumbar region, unspecified whether neurogenic claudication present  Abnormal posture  Difficulty in walking, not elsewhere classified  Muscle spasm of back  Muscle weakness (generalized)     Problem List Patient Active Problem List   Diagnosis Date Noted  . Rectal bleeding 09/02/2016  . Right hip pain 08/06/2016  . MDD (major depressive disorder), single episode, moderate ([place]) 05/07/2016  . Tick bite of groin 02/20/2016  . Advance directive discussed with patient 01/15/2016  . Spinal stenosis of lumbar region with neurogenic claudication 11/07/2015  . Chronic back pain 03/29/2015  . Preventative health care 01/10/2015  . Osteoarthritis, hip, bilateral 11/28/2014  . Peripheral neuropathy ([place]) 11/28/2014  . Type 2 diabetes,  controlled, with neuropathy ([place]) 01/20/2014  . Episodic mood disorder ([place]) 01/20/2014  . Memory loss 12/26/2013  .  Bleeding hemorrhoid 11/07/2013  . IBS (irritable bowel syndrome) 12/27/2012  . Essential hypertension, benign 08/06/2010    Darrel Hoover  PT 12/18/2016, 12:22 PM  [facility] [place] [address] [place], [place], [address] Phone: [phone]   Fax:  [phone]  Name: [name] MRN: [id number] Date of Birth: [birth year]

## 2016-12-22 ENCOUNTER — Ambulatory Visit: Payer: Medicare Other

## 2016-12-22 DIAGNOSIS — R293 Abnormal posture: Secondary | ICD-10-CM

## 2016-12-22 DIAGNOSIS — M6281 Muscle weakness (generalized): Secondary | ICD-10-CM | POA: Diagnosis not present

## 2016-12-22 DIAGNOSIS — R262 Difficulty in walking, not elsewhere classified: Secondary | ICD-10-CM | POA: Diagnosis not present

## 2016-12-22 DIAGNOSIS — M48061 Spinal stenosis, lumbar region without neurogenic claudication: Secondary | ICD-10-CM | POA: Diagnosis not present

## 2016-12-22 DIAGNOSIS — M62830 Muscle spasm of back: Secondary | ICD-10-CM | POA: Diagnosis not present

## 2016-12-22 NOTE — Therapy (Signed)
[place], [place], [address] Phone: [phone]   Fax:  [phone]  Physical Therapy Treatment  Patient Details  Name: [name] MRN: [id number] Date of Birth: Mar 23, 1946 Referring Provider: Viviana Simpler, MD  Encounter Date: 12/22/2016      PT End of Session - 12/22/16 1239    Visit Number 5   Number of Visits 12   Date for PT Re-Evaluation 01/16/17   Authorization Type UHC MCR   PT Start Time 1106   PT Stop Time 8338   PT Time Calculation (min) 39 min   Activity Tolerance Patient tolerated treatment well;No increased pain   Behavior During Therapy WFL for tasks assessed/performed      Past Medical History:  Diagnosis Date  . Arthritis   . Confusion   . Diabetes mellitus without complication ([place])   . Hepatitis   . Hypertension   . Lumbar stenosis   . Memory loss   . Nocturia   . Numbness and tingling    legs and feet  . Wears glasses     Past Surgical History:  Procedure Laterality Date  . [place]  . COLONOSCOPY    . HEMORRHOID SURGERY  2015  . TRANSFORAMINAL LUMBAR INTERBODY FUSION (TLIF) WITH PEDICLE SCREW FIXATION 2 LEVEL N/A 11/07/2015   Procedure: Lumbar three-four Lumbar four-five  transforaminal lumbar interbody fusion with interbody prosthesis posterior lateral arthrodesis and posterior segmental instrumentation;  Surgeon: Newman Pies, MD;  Location: [facility];  Service: Neurosurgery;  Laterality: N/A;    There were no vitals filed for this visit.      Subjective Assessment - 12/22/16 1151    Subjective She reports she still uses pain meds every 4 hours.   Trouble breathing with traction so does not want to do this again   Currently in Pain? Yes   Pain Score 2   sitting but walking in to clinic pain was 4/10   Pain Location Buttocks   Pain Orientation Right;Left   Pain Descriptors / Indicators Tingling   Pain Type Chronic pain   Pain Onset  More than a month ago   Pain Frequency Intermittent  sit in recliner   Aggravating Factors  Activity on feet   Pain Relieving Factors recliner bend in standing/ walk   Multiple Pain Sites No                         [facility] Adult PT Treatment/Exercise - 12/22/16 0001      Lumbar Exercises: Stretches   Single Knee to Chest Stretch 2 reps;20 seconds   Pelvic Tilt Limitations 12 reps 10 sec hold   Piriformis Stretch 2 reps;20 seconds     Modalities   Modalities Ultrasound     Ultrasound   Ultrasound Location 100% 1.2 WQcm2 1MHz L SI lateral sacrum area        Time spent with education with model related to anatomy of spine and pelvis and location of tenderness and structures that may contribute to pain and relation to posture.   Time lost with waiting for pt to dress [name]        PT Short Term Goals - 12/22/16 1241      PT SHORT TERM GOAL #1   Title She will be independent with inital HEP   Status Achieved     PT SHORT TERM GOAL #2   Title She will report pain decr so  able to stand > 5 min for home tasks   Baseline Pain same with standing   Status On-going     PT SHORT TERM GOAL #3   Title She will report able to walk in home 50-100 feet before pain increases   Baseline starts immediatly   Status On-going           PT Long Term Goals - 12/08/16 1537      PT LONG TERM GOAL #1   Title she will be independne t with all hEP issued    Time 6   Period Weeks   Status New     PT LONG TERM GOAL #2   Title she will report able to stand for 5 min with pain 2-3/10  max pain   Time 6   Period Weeks   Status New     PT LONG TERM GOAL #3   Title She will report able to walk in home with pain 2-3 max pain.    Time 6   Period Weeks   Status New     PT LONG TERM GOAL #4   Title She will be able to grocery shop with 6/10 max pain   Time 6   Period Weeks   Status New               Plan - 12/22/16 1239    Clinical Impression Statement  [name] reports no improvement in pain but today walked in with more erect posture and walked out the same. She did report noticing walking with less flexion.  She said she though the [place] eased pain but did not like the pressure with the sound head.    PT Treatment/Interventions Moist Heat;Passive range of motion;Patient/family education;Manual techniques;Therapeutic exercise;Taping;Dry needling;Iontophoresis 4mg /ml Dexamethasone   PT Next Visit Plan Manual traction , flexion based exercises , manual, MHP/ES, Add to HEP,  , trial of lumar suppport with inflating device, Add  LTR, bent knee bends, dead bug as ppossible HEP   PT Home Exercise Plan PPT, knee to chest single      Patient will benefit from skilled therapeutic intervention in order to improve the following deficits and impairments:  Decreased range of motion, Difficulty walking, Pain, Postural dysfunction, Decreased strength, Decreased activity tolerance, Increased muscle spasms  Visit Diagnosis: Spinal stenosis of lumbar region, unspecified whether neurogenic claudication present  Abnormal posture  Difficulty in walking, not elsewhere classified  Muscle spasm of back  Muscle weakness (generalized)     Problem List Patient Active Problem List   Diagnosis Date Noted  . Rectal bleeding 09/02/2016  . Right hip pain 08/06/2016  . MDD (major depressive disorder), single episode, moderate ([place]) 05/07/2016  . Tick bite of groin 02/20/2016  . Advance directive discussed with patient 01/15/2016  . Spinal stenosis of lumbar region with neurogenic claudication 11/07/2015  . Chronic back pain 03/29/2015  . Preventative health care 01/10/2015  . Osteoarthritis, hip, bilateral 11/28/2014  . Peripheral neuropathy 11/28/2014  . Type 2 diabetes, controlled, with neuropathy ([place]) 01/20/2014  . Episodic mood disorder ([place]) 01/20/2014  . Memory loss 12/26/2013  . Bleeding hemorrhoid 11/07/2013  . IBS (irritable bowel syndrome)  12/27/2012  . Essential hypertension, benign 08/06/2010    Darrel Hoover  PT 12/22/2016, 12:51 PM  [facility] [address] [place], [place], [address] Phone: [phone]   Fax:  [phone]  Name: [name] MRN: [id number] Date of Birth: September 08, 1946

## 2016-12-25 ENCOUNTER — Ambulatory Visit: Payer: Medicare Other | Admitting: Physical Therapy

## 2016-12-25 ENCOUNTER — Encounter: Payer: Self-pay | Admitting: Physical Therapy

## 2016-12-25 DIAGNOSIS — M62830 Muscle spasm of back: Secondary | ICD-10-CM

## 2016-12-25 DIAGNOSIS — R293 Abnormal posture: Secondary | ICD-10-CM

## 2016-12-25 DIAGNOSIS — R262 Difficulty in walking, not elsewhere classified: Secondary | ICD-10-CM

## 2016-12-25 DIAGNOSIS — M6281 Muscle weakness (generalized): Secondary | ICD-10-CM | POA: Diagnosis not present

## 2016-12-25 DIAGNOSIS — M48061 Spinal stenosis, lumbar region without neurogenic claudication: Secondary | ICD-10-CM

## 2016-12-25 NOTE — Patient Instructions (Addendum)

## 2016-12-25 NOTE — Therapy (Signed)
[place], [place], [address] Phone: [phone]   Fax:  [phone]  Physical Therapy Treatment  Patient Details  Name: [name] MRN: [id number] Date of Birth: Jun 03, 1946 Referring Provider: Viviana Simpler, MD  Encounter Date: 12/25/2016      PT End of Session - 12/25/16 1842    Visit Number 6   Number of Visits 12   Date for PT Re-Evaluation 01/16/17   PT Start Time 1103   PT Stop Time 1150   PT Time Calculation (min) 47 min   Activity Tolerance Patient tolerated treatment well   Behavior During Therapy [facility] for tasks assessed/performed      Past Medical History:  Diagnosis Date  . Arthritis   . Confusion   . Diabetes mellitus without complication ([place])   . Hepatitis   . Hypertension   . Lumbar stenosis   . Memory loss   . Nocturia   . Numbness and tingling    legs and feet  . Wears glasses     Past Surgical History:  Procedure Laterality Date  . [place]  . COLONOSCOPY    . HEMORRHOID SURGERY  2015  . TRANSFORAMINAL LUMBAR INTERBODY FUSION (TLIF) WITH PEDICLE SCREW FIXATION 2 LEVEL N/A 11/07/2015   Procedure: Lumbar three-four Lumbar four-five  transforaminal lumbar interbody fusion with interbody prosthesis posterior lateral arthrodesis and posterior segmental instrumentation;  Surgeon: Newman Pies, MD;  Location: [facility] [facility];  Service: Neurosurgery;  Laterality: N/A;    There were no vitals filed for this visit.      Subjective Assessment - 12/25/16 1105    Subjective (P)  I was able to vacume a little yesterday. I was able to get into my size 14 Jeans today.  I was able to clean things ahead of schedule.  I felt better after the [place].     Currently in Pain? (P)  Yes   Pain Score (P)  2   4/10   Pain Location (P)  Buttocks   Pain Orientation (P)  Right;Left   Pain Type (P)  Chronic pain   Pain Radiating Towards (P)  ankle, left leg   Pain Frequency (P)   Intermittent   Aggravating Factors  (P)  standing and walking too long   Pain Relieving Factors (P)  recliner,  tylenol   Effect of Pain on Daily Activities (P)  limits standing and walking                         [facility] Adult PT Treatment/Exercise - 12/25/16 0001      Self-Care   Self-Care ADL's;Other Self-Care Comments   ADL's educated from handout, issued,  modificationd practiced,  golfer's lift,   others demo for her.    Other Self-Care Comments  Trial of decompression brace.  She tried it twice today and posture improved.  She has decided she wants to get one.      Lumbar Exercises: Stretches   Piriformis Stretch 3 reps;20 seconds     Lumbar Exercises: Supine   Other Supine Lumbar Exercises Lumbar stabilization 1 exercises added to HEP all issued.  5-10 reps each.  Leg pain noted after these.  She did not report until done.  Piriformis stretch helped ease her pain.                PT Education - 12/25/16 1841    Education provided Yes   Education  Details HEP,  ADL   Person(s) Educated Patient   Methods Explanation;Demonstration;Verbal cues;Handout   Comprehension Verbalized understanding;Returned demonstration;Need further instruction          PT Short Term Goals - 12/22/16 1241      PT SHORT TERM GOAL #1   Title She will be independent with inital HEP   Status Achieved     PT SHORT TERM GOAL #2   Title She will report pain decr so able to stand > 5 min for home tasks   Baseline Pain same with standing   Status On-going     PT SHORT TERM GOAL #3   Title She will report able to walk in home 50-100 feet before pain increases   Baseline starts immediatly   Status On-going           PT Long Term Goals - 12/08/16 1537      PT LONG TERM GOAL #1   Title she will be independne t with all hEP issued    Time 6   Period Weeks   Status New     PT LONG TERM GOAL #2   Title she will report able to stand for 5 min with pain 2-3/10  max pain    Time 6   Period Weeks   Status New     PT LONG TERM GOAL #3   Title She will report able to walk in home with pain 2-3 max pain.    Time 6   Period Weeks   Status New     PT LONG TERM GOAL #4   Title She will be able to grocery shop with 6/10 max pain   Time 6   Period Weeks   Status New               Plan - 12/25/16 1842    Clinical Impression Statement Trial of decompression Brace proved helpful and now patient wants one.  Will persue an order. ADL education and HEP continued today.  Posture improved with brace.   PT Next Visit Plan Manual traction , flexion based exercises , manual, MHP/ES, Add to HEP,  , Get order l of lumar suppport with inflating device, Add  LTR, bent knee bends, dead bug as ppossible HEP   PT Home Exercise Plan PPT, knee to chest singleLumbar stabilization 1 series.    Consulted and Agree with Plan of Care Patient      Patient will benefit from skilled therapeutic intervention in order to improve the following deficits and impairments:  Decreased range of motion, Difficulty walking, Pain, Postural dysfunction, Decreased strength, Decreased activity tolerance, Increased muscle spasms  Visit Diagnosis: Spinal stenosis of lumbar region, unspecified whether neurogenic claudication present  Abnormal posture  Difficulty in walking, not elsewhere classified  Muscle spasm of back  Muscle weakness (generalized)     Problem List Patient Active Problem List   Diagnosis Date Noted  . Rectal bleeding 09/02/2016  . Right hip pain 08/06/2016  . MDD (major depressive disorder), single episode, moderate ([place]) 05/07/2016  . Tick bite of groin 02/20/2016  . Advance directive discussed with patient 01/15/2016  . Spinal stenosis of lumbar region with neurogenic claudication 11/07/2015  . Chronic back pain 03/29/2015  . Preventative health care 01/10/2015  . Osteoarthritis, hip, bilateral 11/28/2014  . Peripheral neuropathy 11/28/2014  . Type 2  diabetes, controlled, with neuropathy ([place]) 01/20/2014  . Episodic mood disorder ([place]) 01/20/2014  . Memory loss 12/26/2013  . Bleeding hemorrhoid 11/07/2013  .  IBS (irritable bowel syndrome) 12/27/2012  . Essential hypertension, benign 08/06/2010    HARRIS,KAREN PTA 12/25/2016, 6:45 PM  [facility] [address] [place], [place], [address] Phone: [phone]   Fax:  [phone]  Name: [name] MRN: [id number] Date of Birth: 12/29/1945

## 2016-12-29 ENCOUNTER — Encounter: Payer: Self-pay | Admitting: Physical Therapy

## 2016-12-29 ENCOUNTER — Ambulatory Visit: Payer: Medicare Other | Admitting: Physical Therapy

## 2016-12-29 DIAGNOSIS — M6281 Muscle weakness (generalized): Secondary | ICD-10-CM | POA: Diagnosis not present

## 2016-12-29 DIAGNOSIS — R293 Abnormal posture: Secondary | ICD-10-CM | POA: Diagnosis not present

## 2016-12-29 DIAGNOSIS — M48061 Spinal stenosis, lumbar region without neurogenic claudication: Secondary | ICD-10-CM | POA: Diagnosis not present

## 2016-12-29 DIAGNOSIS — M62830 Muscle spasm of back: Secondary | ICD-10-CM | POA: Diagnosis not present

## 2016-12-29 DIAGNOSIS — R262 Difficulty in walking, not elsewhere classified: Secondary | ICD-10-CM | POA: Diagnosis not present

## 2016-12-29 NOTE — Therapy (Signed)
[place], [place], [address] Phone: [phone]   Fax:  [phone]  Physical Therapy Treatment  Patient Details  Name: [name] MRN: [id number] Date of Birth: [birth year] Referring Provider: Viviana Simpler, MD  Encounter Date: 12/29/2016      PT End of Session - 12/29/16 1201    Visit Number 7   Number of Visits 12   Date for PT Re-Evaluation 01/16/17   PT Start Time 1103   PT Stop Time 1146   PT Time Calculation (min) 43 min   Activity Tolerance Patient tolerated treatment well   Behavior During Therapy [facility] for tasks assessed/performed      Past Medical History:  Diagnosis Date  . Arthritis   . Confusion   . Diabetes mellitus without complication ([place])   . Hepatitis   . Hypertension   . Lumbar stenosis   . Memory loss   . Nocturia   . Numbness and tingling    legs and feet  . Wears glasses     Past Surgical History:  Procedure Laterality Date  . [place]  . COLONOSCOPY    . HEMORRHOID SURGERY  2015  . TRANSFORAMINAL LUMBAR INTERBODY FUSION (TLIF) WITH PEDICLE SCREW FIXATION 2 LEVEL N/A 11/07/2015   Procedure: Lumbar three-four Lumbar four-five  transforaminal lumbar interbody fusion with interbody prosthesis posterior lateral arthrodesis and posterior segmental instrumentation;  Surgeon: Newman Pies, MD;  Location: [place] [facility];  Service: Neurosurgery;  Laterality: N/A;    There were no vitals filed for this visit.      Subjective Assessment - 12/29/16 1117    Subjective I have been feeling better .  I think the brace gave me some hope.  The exercises may have helped me.    Currently in Pain? Yes   Pain Score 2    Pain Location Buttocks   Pain Orientation Right   Pain Descriptors / Indicators Tingling   Pain Type Chronic pain   Pain Radiating Towards --  bottoms of feet tingle at ight   Pain Onset More than a month ago   Pain Frequency Intermittent    Aggravating Factors  night    Pain Relieving Factors meds,  brace ,     Multiple Pain Sites No                         [facility] Adult PT Treatment/Exercise - 12/29/16 0001      Lumbar Exercises: Stretches   Single Knee to Chest Stretch 2 reps;20 seconds   Pelvic Tilt Limitations --  10 X 5 second,  cued initially for breathing   Piriformis Stretch 2 reps;20 seconds  tried in sidelying     Lumbar Exercises: Supine   Bridge 10 reps   Isometric Hip Flexion 5 reps   Isometric Hip Flexion Limitations each ,  cued forslow breathing.    Large Ball Oblique Isometric Limitations 5 reps, right, left   Other Supine Lumbar Exercises Lumbar stabilization 1 exercises   10 reps each.       Lumbar Exercises: Sidelying   Clam 10 reps   Clam Limitations each side.   Other Sidelying Lumbar Exercises Transversus abdominus  Breathing with palpation and clam Right side,  HEP     Manual Therapy   Manual therapy comments left paraspinal spasm : soft tissue work painful, brief , stopped at patient's request.  Pain eased with knee to chest stretch,.  PT Education - 12/29/16 1159    Education provided Yes   Education Details HEP   Person(s) Educated Patient   Methods Explanation;Tactile cues;Verbal cues;Handout   Comprehension Verbalized understanding;Returned demonstration          PT Short Term Goals - 12/29/16 1205      PT SHORT TERM GOAL #1   Title She will be independent with inital HEP   Time 3   Period Weeks   Status Achieved     PT SHORT TERM GOAL #2   Title She will report pain decr so able to stand > 5 min for home tasks   Time 3   Period Weeks   Status Unable to assess     PT SHORT TERM GOAL #3   Title She will report able to walk in home 50-100 feet before pain increases   Time 3   Period Weeks   Status Unable to assess           PT Long Term Goals - 12/08/16 1537      PT LONG TERM GOAL #1   Title she will be independne t  with all hEP issued    Time 6   Period Weeks   Status New     PT LONG TERM GOAL #2   Title she will report able to stand for 5 min with pain 2-3/10  max pain   Time 6   Period Weeks   Status New     PT LONG TERM GOAL #3   Title She will report able to walk in home with pain 2-3 max pain.    Time 6   Period Weeks   Status New     PT LONG TERM GOAL #4   Title She will be able to grocery shop with 6/10 max pain   Time 6   Period Weeks   Status New               Plan - 12/29/16 1201    Clinical Impression Statement Patient eager to get Decompression brace.  She needs minor cues for her HEP.  Posture improving.  Able to tolerate more activity at home.  Patient's MD suggested tingling in bottoms of feet may be due to neuropothy   PT Next Visit Plan Manual traction , flexion based exercises , ,  , Check for  order l of lumar suppport with inflating device.  MD note.  She wants to discuss with MD prior to scheduling more appointments.    PT Home Exercise Plan PPT, knee to chest singleLumbar stabilization 1 series. Transversus abdominus.   Consulted and Agree with Plan of Care Patient      Patient will benefit from skilled therapeutic intervention in order to improve the following deficits and impairments:  Decreased range of motion, Difficulty walking, Pain, Postural dysfunction, Decreased strength, Decreased activity tolerance, Increased muscle spasms  Visit Diagnosis: Spinal stenosis of lumbar region, unspecified whether neurogenic claudication present  Abnormal posture  Difficulty in walking, not elsewhere classified  Muscle spasm of back  Muscle weakness (generalized)     Problem List Patient Active Problem List   Diagnosis Date Noted  . Rectal bleeding 09/02/2016  . Right hip pain 08/06/2016  . MDD (major depressive disorder), single episode, moderate ([place]) 05/07/2016  . Tick bite of groin 02/20/2016  . Advance directive discussed with patient 01/15/2016   . Spinal stenosis of lumbar region with neurogenic claudication 11/07/2015  . Chronic back pain 03/29/2015  .  Preventative health care 01/10/2015  . Osteoarthritis, hip, bilateral 11/28/2014  . Peripheral neuropathy 11/28/2014  . Type 2 diabetes, controlled, with neuropathy ([place]) 01/20/2014  . Episodic mood disorder ([place]) 01/20/2014  . Memory loss 12/26/2013  . Bleeding hemorrhoid 11/07/2013  . IBS (irritable bowel syndrome) 12/27/2012  . Essential hypertension, benign 08/06/2010    Pankaj Haack PTA 12/29/2016, 12:11 PM  [facility] [address] [place], [place], [address] Phone: [phone]   Fax:  [phone]  Name: DANELIA SNODGRASS MRN: [id number] Date of Birth: October 20, 1945

## 2016-12-29 NOTE — Patient Instructions (Signed)
Transversus abdominus 1,2 added from the exercise drawer. Up to 10 x each 5 second holds. May do on side.

## 2016-12-30 ENCOUNTER — Encounter: Payer: Self-pay | Admitting: Dietician

## 2017-01-01 ENCOUNTER — Ambulatory Visit: Payer: Medicare Other

## 2017-01-01 DIAGNOSIS — M62830 Muscle spasm of back: Secondary | ICD-10-CM | POA: Diagnosis not present

## 2017-01-01 DIAGNOSIS — R293 Abnormal posture: Secondary | ICD-10-CM | POA: Diagnosis not present

## 2017-01-01 DIAGNOSIS — M6281 Muscle weakness (generalized): Secondary | ICD-10-CM

## 2017-01-01 DIAGNOSIS — M48061 Spinal stenosis, lumbar region without neurogenic claudication: Secondary | ICD-10-CM

## 2017-01-01 DIAGNOSIS — R262 Difficulty in walking, not elsewhere classified: Secondary | ICD-10-CM | POA: Diagnosis not present

## 2017-01-01 NOTE — Patient Instructions (Signed)
From cabinet and written  1-2x/day 10-20 reps  With 2-5 sec hold wall sit , sitting posterior lean with arm lifts and with foot lifts

## 2017-01-01 NOTE — Therapy (Signed)
[place], [place], [address] Phone: [phone]   Fax:  [phone]  Physical Therapy Treatment  Patient Details  Name: [name] MRN: [id number] Date of Birth: 1946-02-22 Referring Provider: Viviana Simpler, MD  Encounter Date: 01/01/2017      PT End of Session - 01/01/17 1101    Visit Number 8   Number of Visits 12   Date for PT Re-Evaluation 01/16/17   Authorization Type [employer] MCR   PT Start Time 1102   PT Stop Time 1146   PT Time Calculation (min) 44 min   Activity Tolerance Patient tolerated treatment well;Patient limited by pain      Past Medical History:  Diagnosis Date  . Arthritis   . Confusion   . Diabetes mellitus without complication ([place])   . Hepatitis   . Hypertension   . Lumbar stenosis   . Memory loss   . Nocturia   . Numbness and tingling    legs and feet  . Wears glasses     Past Surgical History:  Procedure Laterality Date  . [place]  . COLONOSCOPY    . HEMORRHOID SURGERY  2015  . TRANSFORAMINAL LUMBAR INTERBODY FUSION (TLIF) WITH PEDICLE SCREW FIXATION 2 LEVEL N/A 11/07/2015   Procedure: Lumbar three-four Lumbar four-five  transforaminal lumbar interbody fusion with interbody prosthesis posterior lateral arthrodesis and posterior segmental instrumentation;  Surgeon: Newman Pies, MD;  Location: [place] [facility];  Service: Neurosurgery;  Laterality: N/A;    There were no vitals filed for this visit.      Subjective Assessment - 01/01/17 1102    Subjective Medium well today.   Currently in Pain? Yes   Pain Score 3    Pain Location Buttocks   Pain Orientation Right   Pain Type Chronic pain   Pain Onset More than a month ago   Pain Frequency Constant   Aggravating Factors  worse at night   Pain Relieving Factors meds , brace ,                          [facility] Adult PT Treatment/Exercise - 01/01/17 0001      Self-Care   ADL's  Discussed sleep postures with increase in use of pillows to improve alignment , decr stress to spine in bed     Lumbar Exercises: Stretches   Single Knee to Chest Stretch 1 rep;30 seconds   Pelvic Tilt Limitations 12 reps 10 sec hold   Piriformis Stretch 2 reps;20 seconds     Lumbar Exercises: Standing   Wall Slides 10 reps   Wall Slides Limitations plus 6-8 with ab set     Lumbar Exercises: Seated   Sit to Stand Limitations core stability with gentle roll back  x 12 and arm raises x 15 and knee lifts x 8 issued for HEP     Lumbar Exercises: Supine   Bridge 15 reps   Other Supine Lumbar Exercises Dead bug x 15 RT/LT     Lumbar Exercises: Sidelying   Clam 10 reps   Clam Limitations each side.                PT Education - 01/01/17 1235    Education provided Yes   Education Details HEP   Person(s) Educated Patient   Methods Explanation;Demonstration;Verbal cues;Handout   Comprehension Returned demonstration;Verbalized understanding          PT Short Term  Goals - 12/29/16 1205      PT SHORT TERM GOAL #1   Title She will be independent with inital HEP   Time 3   Period Weeks   Status Achieved     PT SHORT TERM GOAL #2   Title She will report pain decr so able to stand > 5 min for home tasks   Time 3   Period Weeks   Status Unable to assess     PT SHORT TERM GOAL #3   Title She will report able to walk in home 50-100 feet before pain increases   Time 3   Period Weeks   Status Unable to assess           PT Long Term Goals - 12/08/16 1537      PT LONG TERM GOAL #1   Title she will be independne t with all hEP issued    Time 6   Period Weeks   Status New     PT LONG TERM GOAL #2   Title she will report able to stand for 5 min with pain 2-3/10  max pain   Time 6   Period Weeks   Status New     PT LONG TERM GOAL #3   Title She will report able to walk in home with pain 2-3 max pain.    Time 6   Period Weeks   Status New     PT LONG TERM  GOAL #4   Title She will be able to grocery shop with 6/10 max pain   Time 6   Period Weeks   Status New               Plan - 01/01/17 1153    Clinical Impression Statement She continues to do better with more erect posture but still has obvious pain/discomfort limiting activity / speed of mobility. Will send info to vendor for brace.   PT Treatment/Interventions Moist Heat;Passive range of motion;Patient/family education;Manual techniques;Therapeutic exercise;Taping;Dry needling;Iontophoresis 4mg /ml Dexamethasone   PT Next Visit Plan Manual traction , flexion based exercises . Will schedul 4 more visits   PT Home Exercise Plan PPT, knee to chest singleLumbar stabilization 1 series. Transversus abdominus., stancif wall slides, sitting stab exer with arm raises and posterior lean  and post lean with foot lifts   Consulted and Agree with Plan of Care Patient      Patient will benefit from skilled therapeutic intervention in order to improve the following deficits and impairments:  Decreased range of motion, Difficulty walking, Pain, Postural dysfunction, Decreased strength, Decreased activity tolerance, Increased muscle spasms  Visit Diagnosis: Spinal stenosis of lumbar region, unspecified whether neurogenic claudication present  Abnormal posture  Difficulty in walking, not elsewhere classified  Muscle spasm of back  Muscle weakness (generalized)     Problem List Patient Active Problem List   Diagnosis Date Noted  . Rectal bleeding 09/02/2016  . Right hip pain 08/06/2016  . MDD (major depressive disorder), single episode, moderate ([place]) 05/07/2016  . Tick bite of groin 02/20/2016  . Advance directive discussed with patient 01/15/2016  . Spinal stenosis of lumbar region with neurogenic claudication 11/07/2015  . Chronic back pain 03/29/2015  . Preventative health care 01/10/2015  . Osteoarthritis, hip, bilateral 11/28/2014  . Peripheral neuropathy 11/28/2014  . Type  2 diabetes, controlled, with neuropathy ([place]) 01/20/2014  . Episodic mood disorder ([place]) 01/20/2014  . Memory loss 12/26/2013  . Bleeding hemorrhoid 11/07/2013  . IBS (irritable bowel  syndrome) 12/27/2012  . Essential hypertension, benign 08/06/2010    Darrel Hoover  PT 01/01/2017, 12:36 PM  [facility] [address] [place], [place], [address] Phone: [phone]   Fax:  [phone]  Name: [name] MRN: [id number] Date of Birth: [birth year]

## 2017-01-02 ENCOUNTER — Encounter: Payer: Self-pay | Admitting: Internal Medicine

## 2017-01-05 ENCOUNTER — Ambulatory Visit: Payer: Medicare Other

## 2017-01-05 DIAGNOSIS — M62830 Muscle spasm of back: Secondary | ICD-10-CM

## 2017-01-05 DIAGNOSIS — M48061 Spinal stenosis, lumbar region without neurogenic claudication: Secondary | ICD-10-CM

## 2017-01-05 DIAGNOSIS — M6281 Muscle weakness (generalized): Secondary | ICD-10-CM

## 2017-01-05 DIAGNOSIS — R293 Abnormal posture: Secondary | ICD-10-CM

## 2017-01-05 DIAGNOSIS — R262 Difficulty in walking, not elsewhere classified: Secondary | ICD-10-CM

## 2017-01-05 NOTE — Therapy (Signed)
[place], [place], [address] Phone: [phone]   Fax:  [phone]  Physical Therapy Treatment  Patient Details  Name: [name] MRN: [id number] Date of Birth: [birth year] Referring Provider: Viviana Simpler, MD  Encounter Date: 01/05/2017      PT End of Session - 01/05/17 1104    Visit Number 9   Number of Visits 12   Date for PT Re-Evaluation 01/16/17   Authorization Type UHC MCR   PT Start Time 1102   PT Stop Time 1155   PT Time Calculation (min) 53 min   Activity Tolerance Patient tolerated treatment well;Patient limited by pain   Behavior During Therapy [facility] for tasks assessed/performed      Past Medical History:  Diagnosis Date  . Arthritis   . Confusion   . Diabetes mellitus without complication ([place])   . Hepatitis   . Hypertension   . Lumbar stenosis   . Memory loss   . Nocturia   . Numbness and tingling    legs and feet  . Wears glasses     Past Surgical History:  Procedure Laterality Date  . [place]  . COLONOSCOPY    . HEMORRHOID SURGERY  2015  . TRANSFORAMINAL LUMBAR INTERBODY FUSION (TLIF) WITH PEDICLE SCREW FIXATION 2 LEVEL N/A 11/07/2015   Procedure: Lumbar three-four Lumbar four-five  transforaminal lumbar interbody fusion with interbody prosthesis posterior lateral arthrodesis and posterior segmental instrumentation;  Surgeon: Newman Pies, MD;  Location: [place] [facility];  Service: Neurosurgery;  Laterality: N/A;    There were no vitals filed for this visit.      Subjective Assessment - 01/05/17 1107    Subjective She reports she is about the same.  She is doing her HEP. MAybe 1x/week would be better   Currently in Pain? Yes   Pain Score 5    Pain Location Buttocks   Pain Orientation Left   Pain Descriptors / Indicators Tightness;Tingling;Throbbing   Pain Type Chronic pain   Pain Radiating Towards Lt thigh and calf   Pain Onset More than a month  ago   Pain Frequency Constant   Aggravating Factors  lye LT side.  pressure , walk /stand   Pain Relieving Factors meds , heat   Multiple Pain Sites No            [facility] PT Assessment - 01/05/17 0001      Observation/Other Assessments   Focus on Therapeutic Outcomes (FOTO)  45 % limited                     [facility] Adult PT Treatment/Exercise - 01/05/17 0001      Lumbar Exercises: Stretches   Single Knee to Chest Stretch 1 rep;30 seconds   Single Knee to Chest Stretch Limitations stopped due to pain   Lower Trunk Rotation 1 rep;20 seconds   Lower Trunk Rotation Limitations stopped due to pain in Lt leg and tingling. Eased this with long axis pull   Pelvic Tilt Limitations 12 reps 10 sec hold   Piriformis Stretch 1 rep;20 seconds   Piriformis Stretch Limitations pain in LT leg     Lumbar Exercises: Supine   Bridge 15 reps   Other Supine Lumbar Exercises Dead bug x 15 RT/LT   Other Supine Lumbar Exercises reach to ceiling x 10 RT and LT.      Moist Heat Therapy   Number Minutes Moist Heat 10 Minutes  Moist Heat Location Hip  and thigh RT sidelye     Manual Therapy   Manual Therapy Soft tissue mobilization   Soft tissue mobilization with ball to gluts and prirforfis and pressure with rotation of RT hip.    Manual Traction long axis pull to each leg x 30 sec x 5                   PT Short Term Goals - 01/05/17 1241      PT SHORT TERM GOAL #1   Title She will be independent with inital HEP   Status Achieved     PT SHORT TERM GOAL #2   Title She will report pain decr so able to stand > 5 min for home tasks   Status On-going     PT SHORT TERM GOAL #3   Title She will report able to walk in home 50-100 feet before pain increases   Status On-going           PT Long Term Goals - 01/05/17 1240      PT LONG TERM GOAL #1   Title she will be independen t with all hEP issued    Status On-going     PT LONG TERM GOAL #2   Title she will report  able to stand for 5 min with pain 2-3/10  max pain   Status On-going     PT LONG TERM GOAL #3   Title She will report able to walk in home with pain 2-3 max pain.    Status On-going     PT LONG TERM GOAL #4   Title She will be able to grocery shop with 6/10 max pain   Status On-going               Plan - 01/05/17 1237    Clinical Impression Statement She is more erect but reports pain as back and just as bad as initially with standing and walking. Suggested she ay need to talk to MD again. She wants to continue to exercise at home ( she demo last exercises of leaning posteriorly)  and come in 1x/week so we will do this x 2 and assess improvement at that time.    PT Treatment/Interventions Moist Heat;Passive range of motion;Patient/family education;Manual techniques;Therapeutic exercise;Taping;Dry needling;Iontophoresis 4mg /ml Dexamethasone   PT Next Visit Plan Manual traction , flexion based exercises . Will go to 1x/week this week and next and assess changes    PT Home Exercise Plan PPT, knee to chest singleLumbar stabilization 1 series. Transversus abdominus., stancif wall slides, sitting stab exer with arm raises and posterior lean  and post lean with foot lifts   Consulted and Agree with Plan of Care Patient      Patient will benefit from skilled therapeutic intervention in order to improve the following deficits and impairments:  Decreased range of motion, Difficulty walking, Pain, Postural dysfunction, Decreased strength, Decreased activity tolerance, Increased muscle spasms  Visit Diagnosis: Spinal stenosis of lumbar region, unspecified whether neurogenic claudication present  Abnormal posture  Difficulty in walking, not elsewhere classified  Muscle spasm of back  Muscle weakness (generalized)     Problem List Patient Active Problem List   Diagnosis Date Noted  . Rectal bleeding 09/02/2016  . Right hip pain 08/06/2016  . MDD (major depressive disorder), single  episode, moderate ([place]) 05/07/2016  . Tick bite of groin 02/20/2016  . Advance directive discussed with patient 01/15/2016  . Spinal stenosis of lumbar  region with neurogenic claudication 11/07/2015  . Chronic back pain 03/29/2015  . Preventative health care 01/10/2015  . Osteoarthritis, hip, bilateral 11/28/2014  . Peripheral neuropathy 11/28/2014  . Type 2 diabetes, controlled, with neuropathy ([place]) 01/20/2014  . Episodic mood disorder ([place]) 01/20/2014  . Memory loss 12/26/2013  . Bleeding hemorrhoid 11/07/2013  . IBS (irritable bowel syndrome) 12/27/2012  . Essential hypertension, benign 08/06/2010    Darrel Hoover  PT 01/05/2017, 12:47 PM  [facility] [address] [place], [place], [address] Phone: [phone]   Fax:  [phone]  Name: [name] MRN: [id number] Date of Birth: 01/17/1946

## 2017-01-08 ENCOUNTER — Ambulatory Visit: Payer: Medicare Other

## 2017-01-10 ENCOUNTER — Encounter: Payer: Self-pay | Admitting: Internal Medicine

## 2017-01-12 ENCOUNTER — Ambulatory Visit: Payer: Medicare Other | Attending: Internal Medicine

## 2017-01-12 DIAGNOSIS — M62830 Muscle spasm of back: Secondary | ICD-10-CM

## 2017-01-12 DIAGNOSIS — M48061 Spinal stenosis, lumbar region without neurogenic claudication: Secondary | ICD-10-CM | POA: Insufficient documentation

## 2017-01-12 DIAGNOSIS — R262 Difficulty in walking, not elsewhere classified: Secondary | ICD-10-CM | POA: Diagnosis not present

## 2017-01-12 DIAGNOSIS — M6281 Muscle weakness (generalized): Secondary | ICD-10-CM | POA: Diagnosis not present

## 2017-01-12 DIAGNOSIS — R293 Abnormal posture: Secondary | ICD-10-CM | POA: Diagnosis not present

## 2017-01-12 NOTE — Therapy (Addendum)
[place], [place], [address] Phone: [phone]   Fax:  [phone]  Physical Therapy Treatment/ Discharge  Patient Details  Name: [name] MRN: [id number] Date of Birth: September 11, 1945 Referring Provider: Viviana Simpler, MD  Encounter Date: 01/12/2017      PT End of Session - 01/12/17 1127    Visit Number 10   Number of Visits 12   Date for PT Re-Evaluation 01/16/17   Authorization Type [employer] MCR   PT Start Time 0093   PT Stop Time 1130   PT Time Calculation (min) 25 min   Activity Tolerance Patient tolerated treatment well;No increased pain   Behavior During Therapy WFL for tasks assessed/performed      Past Medical History:  Diagnosis Date  . Arthritis   . Confusion   . Diabetes mellitus without complication ([place])   . Hepatitis   . Hypertension   . Lumbar stenosis   . Memory loss   . Nocturia   . Numbness and tingling    legs and feet  . Wears glasses     Past Surgical History:  Procedure Laterality Date  . [place]  . COLONOSCOPY    . HEMORRHOID SURGERY  2015  . TRANSFORAMINAL LUMBAR INTERBODY FUSION (TLIF) WITH PEDICLE SCREW FIXATION 2 LEVEL N/A 11/07/2015   Procedure: Lumbar three-four Lumbar four-five  transforaminal lumbar interbody fusion with interbody prosthesis posterior lateral arthrodesis and posterior segmental instrumentation;  Surgeon: Alexis Pies, MD;  Location: [facility];  Service: Neurosurgery;  Laterality: N/A;    There were no vitals filed for this visit.      Subjective Assessment - 01/12/17 1107    Subjective She reports pain but ok today. She forgot meds for 12 hours yesterday and had severe pain to posterior thighs and calves. . After meds this was eased to mild to mod pain.  to buttock   Currently in Pain? Yes   Pain Score 3    Pain Location Buttocks   Pain Orientation Left;Right  L>R   Pain Descriptors / Indicators  Throbbing;Tingling;Tightness   Pain Type Chronic pain   Pain Radiating Towards Lt thigh and calf   Pain Onset More than a month ago   Pain Frequency Constant   Multiple Pain Sites No                                 PT Education - 01/12/17 1128    Education provided Yes   Education Details Discussed progress and progression of HEP  and need to return to MD to assess need for surgery.    Person(s) Educated Patient   Methods Explanation   Comprehension Verbalized understanding          PT Short Term Goals - 01/05/17 1241      PT SHORT TERM GOAL #1   Title She will be independent with inital HEP   Status Achieved     PT SHORT TERM GOAL #2   Title She will report pain decr so able to stand > 5 min for home tasks   Status On-going     PT SHORT TERM GOAL #3   Title She will report able to walk in home 50-100 feet before pain increases   Status On-going           PT Long Term Goals - 01/05/17 1240      PT  LONG TERM GOAL #1   Title she will be independen t with all hEP issued    Status On-going     PT LONG TERM GOAL #2   Title she will report able to stand for 5 min with pain 2-3/10  max pain   Status On-going     PT LONG TERM GOAL #3   Title She will report able to walk in home with pain 2-3 max pain.    Status On-going     PT LONG TERM GOAL #4   Title She will be able to grocery shop with 6/10 max pain   Status On-going               Plan - 01/12/17 1125    Clinical Impression Statement Ms [name] agreed that she may need to see MD again due to pain really controlled with meds though she does feel exercises have helped. She feel she has enough HEP for now . DDS brace has not come in for her so she call vendor. She agreed to be put on hold for 3-4 weeks so we can help with brace or if she feels she needs more to her HEP we can add. After this will discharge. wih HEP    PT Treatment/Interventions Moist Heat;Passive range of  motion;Patient/family education;Manual techniques;Therapeutic exercise;Taping;Dry needling;Iontophoresis 69m/ml Dexamethasone   PT Next Visit Plan Manual traction , flexion based exercises . Will go to 1x/week this week and next and assess changes    PT Home Exercise Plan PPT, knee to chest singleLumbar stabilization 1 series. Transversus abdominus., stancif wall slides, sitting stab exer with arm raises and posterior lean  and post lean with foot lifts   Consulted and Agree with Plan of Care Patient      Patient will benefit from skilled therapeutic intervention in order to improve the following deficits and impairments:  Decreased range of motion, Difficulty walking, Pain, Postural dysfunction, Decreased strength, Decreased activity tolerance, Increased muscle spasms  Visit Diagnosis: Spinal stenosis of lumbar region, unspecified whether neurogenic claudication present  Abnormal posture  Difficulty in walking, not elsewhere classified  Muscle spasm of back  Muscle weakness (generalized)     Problem List Patient Active Problem List   Diagnosis Date Noted  . Rectal bleeding 09/02/2016  . Right hip pain 08/06/2016  . MDD (major depressive disorder), single episode, moderate ([place] 05/07/2016  . Tick bite of groin 02/20/2016  . Advance directive discussed with patient 01/15/2016  . Spinal stenosis of lumbar region with neurogenic claudication 11/07/2015  . Chronic back pain 03/29/2015  . Preventative health care 01/10/2015  . Osteoarthritis, hip, bilateral 11/28/2014  . Peripheral neuropathy 11/28/2014  . Type 2 diabetes, controlled, with neuropathy ([place] 01/20/2014  . Episodic mood disorder ([place] 01/20/2014  . Memory loss 12/26/2013  . Bleeding hemorrhoid 11/07/2013  . IBS (irritable bowel syndrome) 12/27/2012  . Essential hypertension, benign 08/06/2010    CDarrel Velez PT 01/12/2017, 11:30 AM  [facility] [place] [address]Phone: [phone]  Fax:  [phone] Name: Alexis LUBINSKIMRN: [id number] of Birth: [birth year] PHYSICAL THERAPY DISCHARGE SUMMARY  Visits from Start of Care: 10  Current functional level related to goals / functional outcomes: See above   Remaining deficits: She is walking mor erect but pain levels are not improved   Education / Equipment: HEP , use fo lumbar support. Plan: Patient agrees to discharge.  Patient goals  were not met. Patient is being discharged due to lack of progress.  ?????   Noralee Stain  PT  02/16/17   1053 AM

## 2017-01-15 ENCOUNTER — Ambulatory Visit: Payer: Medicare Other

## 2017-01-20 ENCOUNTER — Encounter: Payer: Self-pay | Admitting: Internal Medicine

## 2017-01-20 ENCOUNTER — Ambulatory Visit (INDEPENDENT_AMBULATORY_CARE_PROVIDER_SITE_OTHER): Payer: Medicare Other | Admitting: Internal Medicine

## 2017-01-20 VITALS — BP 122/84 | HR 75 | Temp 98.0°F | Ht 61.0 in | Wt 172.5 lb

## 2017-01-20 DIAGNOSIS — E1140 Type 2 diabetes mellitus with diabetic neuropathy, unspecified: Secondary | ICD-10-CM | POA: Diagnosis not present

## 2017-01-20 DIAGNOSIS — I10 Essential (primary) hypertension: Secondary | ICD-10-CM | POA: Diagnosis not present

## 2017-01-20 DIAGNOSIS — M48062 Spinal stenosis, lumbar region with neurogenic claudication: Secondary | ICD-10-CM | POA: Diagnosis not present

## 2017-01-20 DIAGNOSIS — Z0000 Encounter for general adult medical examination without abnormal findings: Secondary | ICD-10-CM

## 2017-01-20 DIAGNOSIS — F39 Unspecified mood [affective] disorder: Secondary | ICD-10-CM | POA: Diagnosis not present

## 2017-01-20 DIAGNOSIS — Z7189 Other specified counseling: Secondary | ICD-10-CM

## 2017-01-20 LAB — COMPREHENSIVE METABOLIC PANEL
ALK PHOS: 37 U/L — AB (ref 39–117)
ALT: 14 U/L (ref 0–35)
AST: 16 U/L (ref 0–37)
Albumin: 4.5 g/dL (ref 3.5–5.2)
BUN: 25 mg/dL — AB (ref 6–23)
CHLORIDE: 100 meq/L (ref 96–112)
CO2: 27 mEq/L (ref 19–32)
CREATININE: 0.95 mg/dL (ref 0.40–1.20)
Calcium: 9.6 mg/dL (ref 8.4–10.5)
GFR: 62.04 mL/min (ref >60.00)
GLUCOSE: 101 mg/dL — AB (ref 70–99)
POTASSIUM: 3.6 meq/L (ref 3.5–5.1)
SODIUM: 135 meq/L (ref 135–145)
TOTAL PROTEIN: 7.1 g/dL (ref 6.0–8.3)
Total Bilirubin: 0.4 mg/dL (ref 0.2–1.2)

## 2017-01-20 LAB — HEMOGLOBIN A1C: HEMOGLOBIN A1C: 6 % (ref 4.6–6.5)

## 2017-01-20 LAB — CBC WITH DIFFERENTIAL/PLATELET
Basophils Absolute: 0 10*3/uL (ref 0.0–0.1)
Basophils Relative: 0.4 % (ref 0.0–3.0)
EOS ABS: 0 10*3/uL (ref 0.0–0.7)
Eosinophils Relative: 0.3 % (ref 0.0–5.0)
HCT: 44 % (ref 36.0–46.0)
HEMOGLOBIN: 15 g/dL (ref 12.0–15.0)
LYMPHS ABS: 1.7 10*3/uL (ref 0.7–4.0)
Lymphocytes Relative: 17.7 % (ref 12.0–46.0)
MCHC: 34 g/dL (ref 30.0–36.0)
MCV: 99.3 fl (ref 78.0–100.0)
MONO ABS: 0.8 10*3/uL (ref 0.1–1.0)
Monocytes Relative: 8.5 % (ref 3.0–12.0)
NEUTROS PCT: 73.1 % (ref 43.0–77.0)
Neutro Abs: 7 10*3/uL (ref 1.4–7.7)
Platelets: 304 10*3/uL (ref 150.0–400.0)
RBC: 4.43 Mil/uL (ref 3.87–5.11)
RDW: 14.2 % (ref 11.5–15.5)
WBC: 9.6 10*3/uL (ref 4.0–10.5)

## 2017-01-20 LAB — T4, FREE: FREE T4: 0.82 ng/dL (ref 0.60–1.60)

## 2017-01-20 LAB — HM DIABETES FOOT EXAM

## 2017-01-20 NOTE — Assessment & Plan Note (Signed)
See social history 

## 2017-01-20 NOTE — Assessment & Plan Note (Signed)
Much better with lifestyle efforts Will recheck labs Duloxetine for mixed neuropathy

## 2017-01-20 NOTE — Assessment & Plan Note (Signed)
Not really with MDD anymore On the duloxetine--- for neuropathy and mood

## 2017-01-20 NOTE — Assessment & Plan Note (Signed)
I have personally reviewed the Medicare Annual Wellness questionnaire and have noted 1. The patient's medical and social history 2. Their use of alcohol, tobacco or illicit drugs 3. Their current medications and supplements 4. The patient's functional ability including ADL's, fall risks, home safety risks and hearing or visual             impairment. 5. Diet and physical activities 6. Evidence for depression or mood disorders  The patients weight, height, BMI and visual acuity have been recorded in the chart I have made referrals, counseling and provided education to the patient based review of the above and I have provided the pt with a written personalized care plan for preventive services.  I have provided you with a copy of your personalized plan for preventive services. Please take the time to review along with your updated medication list.  Prefers no mammogram Last colon due 2021 Discussed exercise --even if in pool, etc Yearly flu vaccine

## 2017-01-20 NOTE — Assessment & Plan Note (Signed)
BP Readings from Last 3 Encounters:  01/20/17 122/84  11/28/16 126/78  11/04/16 122/82   Good control

## 2017-01-20 NOTE — Assessment & Plan Note (Signed)
Ongoing limiting back pain May need to go back to surgeon as rehab efforts have not helped Duloxetine hopefully helping some Gabapentin in past

## 2017-01-20 NOTE — Progress Notes (Signed)
Subjective:    Patient ID: [name], female    DOB: 03-01-1946, 71 y.o.   MRN: [id number]  HPI Here for Medicare wellness visit and follow up of chronic health conditions Reviewed form and advanced directives Reviewed other doctors--- Dr Tempie Donning (opto)--at [facility], Dr Heinz Knuckles, Dr Gollan--cardiology, dentist at [facility] Gwynne Edinger) Vision is okay Poor hearing but not wearing hearing aides (except for church/meetings) No falls Independent with instrumental ADLs Some trouble with short term memory--nothing worrisome Not able to exercise much due to back  Has been testing sugars weekly or so 90-115 No hypoglycemic reactions Ongoing foot and leg pain/numbness--hard to tell how much from her back  Back is still bad Has been going to rehab Not really responding--was told to go back to surgeon Having trouble going up stairs again  Mood may be some better "I just don't understand why I am having all these problems" Not anhedonic  No chest pain No SOB Rare dizzy feelings--but no syncope No edema now No palpitations--or very rare  Current Outpatient Prescriptions on File Prior to Visit  Medication Sig Dispense Refill  . acetaminophen (TYLENOL) 500 MG tablet Take 1,000 mg by mouth every 8 (eight) hours as needed.    . Ascorbic Acid (VITAMIN C WITH ROSE HIPS) 1000 MG tablet Take 1,000 mg by mouth daily.    . Blood Glucose Monitoring Suppl (ACCU-CHEK AVIVA PLUS) w/Device KIT Use to test blood sugar once daily Dx: R73.03 1 kit 0  . Cinnamon ([employer] CINNAMON) 500 MG capsule Take 1 capsule (500 mg total) by mouth 2 (two) times daily. (Patient taking differently: Take 1,000 mg by mouth daily. ) 180 capsule 3  . Coenzyme Q10 (CO Q 10 PO) Take 1 capsule by mouth daily.    . cyanocobalamin 500 MCG tablet Take 500 mcg by mouth daily.    . DULoxetine (CYMBALTA) 60 MG capsule Take 1 capsule (60 mg total) by mouth 2 (two) times daily. 60 capsule 5  . glucose blood  (ACCU-CHEK AVIVA PLUS) test strip Use to test blood sugar once daily Dx: R73.03 100 each 1  . Lancets (ACCU-CHEK SOFT TOUCH) lancets Use as instructed 100 each 12  . lisinopril-hydrochlorothiazide (PRINZIDE,ZESTORETIC) 20-25 MG tablet TAKE 2 TABLETS BY MOUTH DAILY 180 tablet 3  . metFORMIN (GLUCOPHAGE-XR) 500 MG 24 hr tablet TAKE 1 TABLET (500 MG TOTAL) BY MOUTH DAILY WITH BREAKFAST. 90 tablet 3  . Multiple Vitamins-Minerals (MULTIVITAMIN WITH MINERALS) tablet Take 1 tablet by mouth daily.     . NON FORMULARY Calcium 250 mg+ Magnesium 250 mg + Potassium 45 mg Takes 1 tablets daily.    . Omega-3 Fatty Acids (FISH OIL) 1000 MG CAPS Take 2 capsules by mouth daily.     . Probiotic Product ([employer]S SENIOR PROBIOTIC) CAPS Take 1 capsule by mouth 2 (two) times daily. (Patient taking differently: Take 1 capsule by mouth daily. ) 180 capsule 3  . Soya Lecithin 1200 MG CAPS Take 1 tablet by mouth daily.      No current facility-administered medications on file prior to visit.     Not on File  Past Medical History:  Diagnosis Date  . Arthritis   . Confusion   . Diabetes mellitus without complication ([place])   . Hepatitis   . Hypertension   . Lumbar stenosis   . Memory loss   . Nocturia   . Numbness and tingling    legs and feet  . Wears glasses     Past Surgical  History:  Procedure Laterality Date  . [place]  . COLONOSCOPY    . HEMORRHOID SURGERY  2015  . TRANSFORAMINAL LUMBAR INTERBODY FUSION (TLIF) WITH PEDICLE SCREW FIXATION 2 LEVEL N/A 11/07/2015   Procedure: Lumbar three-four Lumbar four-five  transforaminal lumbar interbody fusion with interbody prosthesis posterior lateral arthrodesis and posterior segmental instrumentation;  Surgeon: Newman Pies, MD;  Location: [facility];  Service: Neurosurgery;  Laterality: N/A;    Family History  Problem Relation Age of Onset  . Hypertension Other   . Cancer Other        ovarian  . Cancer Mother   . Heart disease Father      Social History   Social History  . Marital status: Divorced    Spouse name: N/A  . Number of children: 4  . Years of education: N/A   Occupational History  . Not on file.   Social History Main Topics  . Smoking status: Never Smoker  . Smokeless tobacco: Never Used  . Alcohol use No     Comment: rarely  . Drug use: No  . Sexual activity: Not on file   Other Topics Concern  . Not on file   Social History Narrative   Has a living will.   Son is health care POA   Would accept resuscitation.  Would not want prolonged life support if futile.   Probably would not want tube feeds if cognitively unaware   Review of Systems Appetite is okay Really watching eating with the diabetes--has lost around 25# Sleeps okay with tylenol pm---often a second dose also Wears seat belt Teeth okay Bowels are okay-- rare blood on toilet paper. Recent FIT negative Voids fine. Some stress incontinence--- minimal and mostly in the middle of the night No heartburn or dysphagia Dark spot on left arm--wants it checked Varicose veins look bad--but not really painful    Objective:   Physical Exam  Constitutional: She is oriented to person, place, and time. She appears well-nourished. No distress.  HENT:  Mouth/Throat: Oropharynx is clear and moist. No oropharyngeal exudate.  Neck: No thyromegaly present.  Cardiovascular: Normal rate, regular rhythm, normal heart sounds and intact distal pulses.  Exam reveals no gallop.   No murmur heard. Pulmonary/Chest: Effort normal and breath sounds normal. No respiratory distress. She has no wheezes. She has no rales.  Abdominal: Soft. There is no tenderness.  Musculoskeletal: She exhibits no edema or tenderness.  Lymphadenopathy:    She has no cervical adenopathy.  Neurological: She is alert and oriented to person, place, and time.  President-- "Trump, Obama, ?" [id number] Recall 3/3  Decreased sensation in feet  Skin: No rash  noted. No erythema.  No foot lesions Superficial varicosities  Psychiatric: She has a normal mood and affect. Her behavior is normal.          Assessment & Plan:

## 2017-01-20 NOTE — Telephone Encounter (Signed)
Pt had appointment to day with dr.  She stated she received a bill from collections for the no show fee. She wanted to know if there was anything that could be done about this.  Please call pt to let her know what could be done.

## 2017-01-21 ENCOUNTER — Encounter: Payer: Self-pay | Admitting: Internal Medicine

## 2017-01-22 ENCOUNTER — Encounter: Payer: Self-pay | Admitting: Internal Medicine

## 2017-01-22 DIAGNOSIS — G8929 Other chronic pain: Secondary | ICD-10-CM

## 2017-01-22 DIAGNOSIS — M5440 Lumbago with sciatica, unspecified side: Principal | ICD-10-CM

## 2017-01-30 NOTE — Telephone Encounter (Signed)
Spoke with patient today.  Explained that she has 7 no show fees, however none of them are recent (2017-2018), pt will pay bill she received.

## 2017-02-02 ENCOUNTER — Encounter: Payer: Self-pay | Admitting: Internal Medicine

## 2017-02-14 ENCOUNTER — Other Ambulatory Visit: Payer: Self-pay | Admitting: Internal Medicine

## 2017-02-16 ENCOUNTER — Other Ambulatory Visit: Payer: Self-pay

## 2017-02-16 MED ORDER — MELOXICAM 7.5 MG PO TABS: 7.5000 mg | ORAL_TABLET | Freq: Every day | ORAL | 1 refills | Status: DC

## 2017-02-18 ENCOUNTER — Encounter: Payer: Self-pay | Admitting: Internal Medicine

## 2017-02-19 ENCOUNTER — Encounter: Payer: Self-pay | Admitting: Internal Medicine

## 2017-02-19 ENCOUNTER — Telehealth: Payer: Self-pay

## 2017-02-19 DIAGNOSIS — R200 Anesthesia of skin: Secondary | ICD-10-CM

## 2017-02-19 NOTE — Telephone Encounter (Signed)
Patient calls and requests to have a Vitamin B12 lab level drawn.  States that she has been taking Metformin which is known to deplete B12 and she hasn't had hers checked in years.  Please Advise and order if appropriate.  Thanks.

## 2017-02-19 NOTE — Telephone Encounter (Signed)
Please let her know she can come in to get the lab test. It doesn't have to be fasting

## 2017-02-19 NOTE — Telephone Encounter (Signed)
Spoke to pt. Made lab appt tomorrow at 1030.

## 2017-02-20 ENCOUNTER — Other Ambulatory Visit (INDEPENDENT_AMBULATORY_CARE_PROVIDER_SITE_OTHER): Payer: Medicare Other

## 2017-02-20 ENCOUNTER — Encounter: Payer: Self-pay | Admitting: Internal Medicine

## 2017-02-20 DIAGNOSIS — R200 Anesthesia of skin: Secondary | ICD-10-CM

## 2017-02-20 LAB — VITAMIN B12: VITAMIN B 12: 1091 pg/mL — AB (ref 211–911)

## 2017-02-21 ENCOUNTER — Encounter: Payer: Self-pay | Admitting: Internal Medicine

## 2017-02-23 ENCOUNTER — Ambulatory Visit: Payer: Medicare Other | Admitting: Family Medicine

## 2017-02-26 DIAGNOSIS — M5136 Other intervertebral disc degeneration, lumbar region: Secondary | ICD-10-CM | POA: Diagnosis not present

## 2017-02-26 DIAGNOSIS — G894 Chronic pain syndrome: Secondary | ICD-10-CM | POA: Diagnosis not present

## 2017-02-26 DIAGNOSIS — Z5181 Encounter for therapeutic drug level monitoring: Secondary | ICD-10-CM | POA: Diagnosis not present

## 2017-02-26 DIAGNOSIS — Z79899 Other long term (current) drug therapy: Secondary | ICD-10-CM | POA: Diagnosis not present

## 2017-02-26 DIAGNOSIS — M4696 Unspecified inflammatory spondylopathy, lumbar region: Secondary | ICD-10-CM | POA: Diagnosis not present

## 2017-02-27 ENCOUNTER — Encounter: Payer: Self-pay | Admitting: Internal Medicine

## 2017-03-01 ENCOUNTER — Encounter: Payer: Self-pay | Admitting: Internal Medicine

## 2017-03-09 ENCOUNTER — Encounter: Payer: Self-pay | Admitting: Internal Medicine

## 2017-03-13 DIAGNOSIS — M47816 Spondylosis without myelopathy or radiculopathy, lumbar region: Secondary | ICD-10-CM | POA: Insufficient documentation

## 2017-03-13 DIAGNOSIS — G894 Chronic pain syndrome: Secondary | ICD-10-CM | POA: Insufficient documentation

## 2017-03-19 ENCOUNTER — Encounter: Payer: Self-pay | Admitting: Internal Medicine

## 2017-03-20 ENCOUNTER — Encounter: Payer: Self-pay | Admitting: Internal Medicine

## 2017-04-08 DIAGNOSIS — Z79899 Other long term (current) drug therapy: Secondary | ICD-10-CM | POA: Diagnosis not present

## 2017-04-08 DIAGNOSIS — M533 Sacrococcygeal disorders, not elsewhere classified: Secondary | ICD-10-CM | POA: Diagnosis not present

## 2017-04-08 DIAGNOSIS — Z7984 Long term (current) use of oral hypoglycemic drugs: Secondary | ICD-10-CM | POA: Diagnosis not present

## 2017-04-08 DIAGNOSIS — E119 Type 2 diabetes mellitus without complications: Secondary | ICD-10-CM | POA: Diagnosis not present

## 2017-04-08 DIAGNOSIS — I10 Essential (primary) hypertension: Secondary | ICD-10-CM | POA: Diagnosis not present

## 2017-04-24 ENCOUNTER — Encounter: Payer: Self-pay | Admitting: Internal Medicine

## 2017-04-25 ENCOUNTER — Encounter: Payer: Self-pay | Admitting: Internal Medicine

## 2017-05-04 ENCOUNTER — Encounter: Payer: Self-pay | Admitting: Internal Medicine

## 2017-05-05 MED ORDER — GABAPENTIN 300 MG PO CAPS: 300.0000 mg | ORAL_CAPSULE | Freq: Two times a day (BID) | ORAL | 0 refills | Status: DC

## 2017-05-05 NOTE — Telephone Encounter (Signed)
Please try to push up her appointment to sooner

## 2017-05-07 ENCOUNTER — Encounter: Payer: Self-pay | Admitting: Internal Medicine

## 2017-05-07 ENCOUNTER — Ambulatory Visit (INDEPENDENT_AMBULATORY_CARE_PROVIDER_SITE_OTHER): Payer: Medicare Other | Admitting: Internal Medicine

## 2017-05-07 VITALS — BP 122/74 | HR 105 | Temp 98.5°F | Wt 165.0 lb

## 2017-05-07 DIAGNOSIS — M48062 Spinal stenosis, lumbar region with neurogenic claudication: Secondary | ICD-10-CM | POA: Diagnosis not present

## 2017-05-07 NOTE — Progress Notes (Signed)
Subjective:    Patient ID: [name], female    DOB: [birth year], 71 y.o.   MRN: [id number]  HPI Here due to increasing pain problems  Pain is variable depending on the time of day At night, gets bad left leg pain---keeps her up (lateral) Okay during day if sitting, but then has pain in back of left hip and posterior left calf Seems to worsen if she walks a lot Still does all ADLs---limited with housework  Did start old gabapentin Taking 360m tid (didn't realize I had prescribed 300 bid) Took tylenol along with this--but no clear day changes Took and 2 tylenol PM and slept great. (had been taking the PM med regularly already)  [facility] for [facility] abnormal--but only on the right  Has been seeing pain doctor Got SI injections on 8/1 No help from this  Current Outpatient Prescriptions on File Prior to Visit  Medication Sig Dispense Refill  . acetaminophen (TYLENOL) 500 MG tablet Take 1,000 mg by mouth every 8 (eight) hours as needed.    . Ascorbic Acid (VITAMIN C WITH ROSE HIPS) 1000 MG tablet Take 1,000 mg by mouth daily.    . Blood Glucose Monitoring Suppl (ACCU-CHEK AVIVA PLUS) w/Device KIT Use to test blood sugar once daily Dx: R73.03 1 kit 0  . Cinnamon ([employer] CINNAMON) 500 MG capsule Take 1 capsule (500 mg total) by mouth 2 (two) times daily. (Patient taking differently: Take 1,000 mg by mouth daily. ) 180 capsule 3  . Coenzyme Q10 (CO Q 10 PO) Take 1 capsule by mouth daily.    . cyanocobalamin 500 MCG tablet Take 500 mcg by mouth daily.    . DULoxetine (CYMBALTA) 60 MG capsule Take 1 capsule (60 mg total) by mouth 2 (two) times daily. 60 capsule 5  . gabapentin (NEURONTIN) 300 MG capsule Take 1 capsule (300 mg total) by mouth 2 (two) times daily. (Patient taking differently: Take 300 mg by mouth 3 (three) times daily. ) 60 capsule 0  . glucose blood (ACCU-CHEK AVIVA PLUS) test strip Use to test blood sugar once daily Dx: R73.03 100 each 1    . Lancets (ACCU-CHEK SOFT TOUCH) lancets Use as instructed 100 each 12  . lisinopril-hydrochlorothiazide (PRINZIDE,ZESTORETIC) 20-25 MG tablet TAKE 2 TABLETS BY MOUTH DAILY 180 tablet 3  . meloxicam (MOBIC) 7.5 MG tablet Take 1 tablet (7.5 mg total) by mouth daily. 60 tablet 1  . metFORMIN (GLUCOPHAGE-XR) 500 MG 24 hr tablet TAKE 1 TABLET (500 MG TOTAL) BY MOUTH DAILY WITH BREAKFAST. 90 tablet 3  . Multiple Vitamins-Minerals (MULTIVITAMIN WITH MINERALS) tablet Take 1 tablet by mouth daily.     . NON FORMULARY Calcium 250 mg+ Magnesium 250 mg + Potassium 45 mg Takes 1 tablets daily.    . Omega-3 Fatty Acids (FISH OIL) 1000 MG CAPS Take 2 capsules by mouth daily.     . Probiotic Product ([employer]S SENIOR PROBIOTIC) CAPS Take 1 capsule by mouth 2 (two) times daily. (Patient taking differently: Take 1 capsule by mouth daily. ) 180 capsule 3  . Soya Lecithin 1200 MG CAPS Take 1 tablet by mouth daily.      No current facility-administered medications on file prior to visit.     No Known Allergies  Past Medical History:  Diagnosis Date  . Arthritis   . Confusion   . Diabetes mellitus without complication ([place]   . Hepatitis   . Hypertension   . Lumbar stenosis   .  Memory loss   . Nocturia   . Numbness and tingling    legs and feet  . Wears glasses     Past Surgical History:  Procedure Laterality Date  . [place]  . COLONOSCOPY    . HEMORRHOID SURGERY  2015  . TRANSFORAMINAL LUMBAR INTERBODY FUSION (TLIF) WITH PEDICLE SCREW FIXATION 2 LEVEL N/A 11/07/2015   Procedure: Lumbar three-four Lumbar four-five  transforaminal lumbar interbody fusion with interbody prosthesis posterior lateral arthrodesis and posterior segmental instrumentation;  Surgeon: Newman Pies, MD;  Location: [place] [facility];  Service: Neurosurgery;  Laterality: N/A;    Family History  Problem Relation Age of Onset  . Hypertension Other   . Cancer Other        ovarian  . Cancer Mother   . Heart disease  Father     Social History   Social History  . Marital status: Divorced    Spouse name: N/A  . Number of children: 4  . Years of education: N/A   Occupational History  . Not on file.   Social History Main Topics  . Smoking status: Never Smoker  . Smokeless tobacco: Never Used  . Alcohol use No     Comment: rarely  . Drug use: No  . Sexual activity: Not on file   Other Topics Concern  . Not on file   Social History Narrative   Has a living will.   Son is health care POA   Would accept resuscitation.  Would not want prolonged life support if futile.   Probably would not want tube feeds if cognitively unaware   Review of Systems  Not sick No fever Appetite is okay Weight is down some    Objective:   Physical Exam  Cardiovascular:  Feet warm and faint DP pulses bilaterally. Absent PT pulses though          Assessment & Plan:

## 2017-05-07 NOTE — Patient Instructions (Signed)
You may take the gabapentin 300mg  twice a day during the day. At night, you may take another one and can increase the night dose to 2-3 as needed. Don't take the tylenol PM with the gabapentin (but regular tylenol is okay)

## 2017-05-07 NOTE — Assessment & Plan Note (Signed)
No help from SI injections Pain is similar to that from before surgery Discussed gabapentin and increases as indicated Will continue the pain clinic appts

## 2017-05-08 ENCOUNTER — Encounter: Payer: Self-pay | Admitting: Internal Medicine

## 2017-05-09 ENCOUNTER — Encounter: Payer: Self-pay | Admitting: Internal Medicine

## 2017-05-11 ENCOUNTER — Encounter: Payer: Self-pay | Admitting: Internal Medicine

## 2017-05-12 ENCOUNTER — Encounter: Payer: Self-pay | Admitting: Internal Medicine

## 2017-05-15 ENCOUNTER — Ambulatory Visit: Payer: Medicare Other | Admitting: Internal Medicine

## 2017-05-15 ENCOUNTER — Other Ambulatory Visit: Payer: Self-pay

## 2017-05-15 MED ORDER — DULOXETINE HCL 60 MG PO CPEP: 60.0000 mg | ORAL_CAPSULE | Freq: Two times a day (BID) | ORAL | 0 refills | Status: DC

## 2017-05-18 ENCOUNTER — Ambulatory Visit: Payer: Medicare Other | Admitting: Internal Medicine

## 2017-05-18 ENCOUNTER — Encounter: Payer: Self-pay | Admitting: Internal Medicine

## 2017-05-19 ENCOUNTER — Encounter: Payer: Self-pay | Admitting: Internal Medicine

## 2017-05-19 ENCOUNTER — Other Ambulatory Visit: Payer: Self-pay | Admitting: Internal Medicine

## 2017-05-19 DIAGNOSIS — M5440 Lumbago with sciatica, unspecified side: Principal | ICD-10-CM

## 2017-05-19 DIAGNOSIS — G8929 Other chronic pain: Secondary | ICD-10-CM

## 2017-05-19 NOTE — Telephone Encounter (Signed)
pls see note from pharmacy. Last Rx 05/05/2017. Pt indicates she has changed dosing and is now taking TID. pls advise.

## 2017-05-20 ENCOUNTER — Encounter: Payer: Self-pay | Admitting: Internal Medicine

## 2017-05-20 DIAGNOSIS — M5416 Radiculopathy, lumbar region: Secondary | ICD-10-CM | POA: Diagnosis not present

## 2017-05-20 DIAGNOSIS — G8929 Other chronic pain: Secondary | ICD-10-CM | POA: Diagnosis not present

## 2017-05-20 MED ORDER — GABAPENTIN 300 MG PO CAPS: ORAL_CAPSULE | ORAL | 3 refills | Status: DC

## 2017-05-20 NOTE — Telephone Encounter (Signed)
Approved: she told me she is taking 2 in day and 3 at night Okay Rx #150 x 3 if that is correct

## 2017-05-20 NOTE — Telephone Encounter (Signed)
Rx sent electronically.  

## 2017-05-22 ENCOUNTER — Encounter: Payer: Self-pay | Admitting: Internal Medicine

## 2017-06-12 ENCOUNTER — Encounter: Payer: Self-pay | Admitting: Internal Medicine

## 2017-06-17 ENCOUNTER — Other Ambulatory Visit: Payer: Self-pay | Admitting: Internal Medicine

## 2017-06-17 DIAGNOSIS — G8929 Other chronic pain: Secondary | ICD-10-CM | POA: Diagnosis not present

## 2017-06-17 DIAGNOSIS — M5416 Radiculopathy, lumbar region: Secondary | ICD-10-CM | POA: Diagnosis not present

## 2017-06-18 ENCOUNTER — Encounter: Payer: Self-pay | Admitting: Internal Medicine

## 2017-06-22 ENCOUNTER — Encounter: Payer: Self-pay | Admitting: Internal Medicine

## 2017-06-22 DIAGNOSIS — M5416 Radiculopathy, lumbar region: Secondary | ICD-10-CM

## 2017-07-08 DIAGNOSIS — G8929 Other chronic pain: Secondary | ICD-10-CM | POA: Diagnosis not present

## 2017-07-08 DIAGNOSIS — M5416 Radiculopathy, lumbar region: Secondary | ICD-10-CM | POA: Diagnosis not present

## 2017-07-10 ENCOUNTER — Encounter: Payer: Self-pay | Admitting: Internal Medicine

## 2017-07-21 ENCOUNTER — Telehealth: Payer: Self-pay | Admitting: Internal Medicine

## 2017-07-21 NOTE — Telephone Encounter (Signed)
Received message from [facility] that they have declined to see this patient b/c previous procedures have been unsuccessful. I called the patient and let her know. She is seeing someone else and said ok.

## 2017-07-22 NOTE — Telephone Encounter (Signed)
Okay; thanks.

## 2017-07-24 ENCOUNTER — Ambulatory Visit: Payer: Self-pay | Admitting: Internal Medicine

## 2017-07-29 DIAGNOSIS — G894 Chronic pain syndrome: Secondary | ICD-10-CM | POA: Diagnosis not present

## 2017-07-29 DIAGNOSIS — M5136 Other intervertebral disc degeneration, lumbar region: Secondary | ICD-10-CM | POA: Diagnosis not present

## 2017-07-29 DIAGNOSIS — M549 Dorsalgia, unspecified: Secondary | ICD-10-CM | POA: Diagnosis not present

## 2017-07-29 DIAGNOSIS — M5416 Radiculopathy, lumbar region: Secondary | ICD-10-CM | POA: Diagnosis not present

## 2017-08-03 ENCOUNTER — Encounter: Payer: Self-pay | Admitting: Internal Medicine

## 2017-08-04 ENCOUNTER — Encounter: Payer: Self-pay | Admitting: Internal Medicine

## 2017-08-04 ENCOUNTER — Ambulatory Visit (INDEPENDENT_AMBULATORY_CARE_PROVIDER_SITE_OTHER): Payer: Medicare Other | Admitting: Internal Medicine

## 2017-08-04 VITALS — BP 126/76 | HR 69 | Temp 98.0°F | Wt 172.5 lb

## 2017-08-04 DIAGNOSIS — E1140 Type 2 diabetes mellitus with diabetic neuropathy, unspecified: Secondary | ICD-10-CM | POA: Diagnosis not present

## 2017-08-04 DIAGNOSIS — F39 Unspecified mood [affective] disorder: Secondary | ICD-10-CM | POA: Diagnosis not present

## 2017-08-04 DIAGNOSIS — M48062 Spinal stenosis, lumbar region with neurogenic claudication: Secondary | ICD-10-CM | POA: Diagnosis not present

## 2017-08-04 DIAGNOSIS — I10 Essential (primary) hypertension: Secondary | ICD-10-CM | POA: Diagnosis not present

## 2017-08-04 LAB — CBC
HCT: 43.5 % (ref 36.0–46.0)
Hemoglobin: 14.8 g/dL (ref 12.0–15.0)
MCHC: 33.9 g/dL (ref 30.0–36.0)
MCV: 102.1 fl — ABNORMAL HIGH (ref 78.0–100.0)
Platelets: 295 10*3/uL (ref 150.0–400.0)
RBC: 4.26 Mil/uL (ref 3.87–5.11)
RDW: 13.4 % (ref 11.5–15.5)
WBC: 8.9 10*3/uL (ref 4.0–10.5)

## 2017-08-04 LAB — COMPREHENSIVE METABOLIC PANEL
ALK PHOS: 45 U/L (ref 39–117)
ALT: 18 U/L (ref 0–35)
AST: 19 U/L (ref 0–37)
Albumin: 4.2 g/dL (ref 3.5–5.2)
BUN: 28 mg/dL — AB (ref 6–23)
CHLORIDE: 99 meq/L (ref 96–112)
CO2: 30 mEq/L (ref 19–32)
CREATININE: 0.96 mg/dL (ref 0.40–1.20)
Calcium: 9.9 mg/dL (ref 8.4–10.5)
GFR: 61.2 mL/min (ref >60.00)
GLUCOSE: 92 mg/dL (ref 70–99)
POTASSIUM: 4.1 meq/L (ref 3.5–5.1)
SODIUM: 138 meq/L (ref 135–145)
TOTAL PROTEIN: 6.9 g/dL (ref 6.0–8.3)
Total Bilirubin: 0.5 mg/dL (ref 0.2–1.2)

## 2017-08-04 LAB — LIPID PANEL
Cholesterol: 222 mg/dL — ABNORMAL HIGH (ref 0–200)
HDL: 72.2 mg/dL (ref >39.00)
LDL CALC: 115 mg/dL — AB (ref 0–99)
NONHDL: 150.09
Total CHOL/HDL Ratio: 3
Triglycerides: 174 mg/dL — ABNORMAL HIGH (ref 0.0–149.0)
VLDL: 34.8 mg/dL (ref 0.0–40.0)

## 2017-08-04 LAB — HEMOGLOBIN A1C: HEMOGLOBIN A1C: 5.8 % (ref 4.6–6.5)

## 2017-08-04 MED ORDER — GABAPENTIN 300 MG PO CAPS: ORAL_CAPSULE | ORAL | 0 refills | Status: DC

## 2017-08-04 NOTE — Progress Notes (Signed)
Subjective:    Patient ID: [name], female    DOB: 1946-05-07, 71 y.o.   MRN: [id number]  HPI Here for follow up of diabetes and other chronic health conditions  Has gone to a new pain doctor Has had 2 sets of injections but doesn't feel that it has helped They mentioned a spinal cord stimulator--but she isn't too excited about it Has gone on line Continuing on the gabapentin-- using 1 (and occasionally 2) bid and 3 tabs at bedtime Takes tylenol along with this--3 at a time. Discussed limiting this Still does her own housework, etc---spreads it out Shopping really is hard-- but she just takes more medicine  Checks sugars 2-3 times a week Highest 132--but generally under 110 No low sugar reactions  Not really feeling depressed ?some degree of anhedonia Trouble dealing with the pain  Current Outpatient Medications on File Prior to Visit  Medication Sig Dispense Refill  . acetaminophen (TYLENOL) 500 MG tablet Take 1,000 mg by mouth every 8 (eight) hours as needed.    Marland Kitchen Apoaequorin (PREVAGEN) 10 MG CAPS Take 0.5 tablets by mouth daily.    . Ascorbic Acid (VITAMIN C WITH ROSE HIPS) 1000 MG tablet Take 1,000 mg by mouth daily.    . Blood Glucose Monitoring Suppl (ACCU-CHEK AVIVA PLUS) w/Device KIT Use to test blood sugar once daily Dx: R73.03 1 kit 0  . Cinnamon ([employer] CINNAMON) 500 MG capsule Take 1 capsule (500 mg total) by mouth 2 (two) times daily. (Patient taking differently: Take 1,000 mg by mouth daily. ) 180 capsule 3  . Coenzyme Q10 (CO Q 10 PO) Take 1 capsule by mouth daily.    . cyanocobalamin 500 MCG tablet Take 500 mcg by mouth daily.    . DULoxetine (CYMBALTA) 60 MG capsule Take 1 capsule (60 mg total) by mouth 2 (two) times daily. 180 capsule 0  . gabapentin (NEURONTIN) 300 MG capsule Take 1 cap twice a day and 3 caps at bedtime 150 capsule 3  . glucose blood (ACCU-CHEK AVIVA PLUS) test strip Use to test blood sugar once daily Dx: R73.03 100 each 1  . Lancets  (ACCU-CHEK SOFT TOUCH) lancets Use as instructed 100 each 12  . lisinopril-hydrochlorothiazide (PRINZIDE,ZESTORETIC) 20-25 MG tablet TAKE 2 TABLETS BY MOUTH DAILY 180 tablet 3  . meloxicam (MOBIC) 7.5 MG tablet TAKE 1 TABLET BY MOUTH EVERY DAY 90 tablet 1  . metFORMIN (GLUCOPHAGE-XR) 500 MG 24 hr tablet TAKE 1 TABLET (500 MG TOTAL) BY MOUTH DAILY WITH BREAKFAST. 90 tablet 3  . Multiple Vitamins-Minerals (MULTIVITAMIN WITH MINERALS) tablet Take 1 tablet by mouth daily.     . NON FORMULARY Calcium 250 mg+ Magnesium 250 mg + Potassium 45 mg Takes 1 tablets daily.    . Omega-3 Fatty Acids (FISH OIL) 1000 MG CAPS Take 2 capsules by mouth daily.     . Probiotic Product ([employer] SENIOR PROBIOTIC) CAPS Take 1 capsule by mouth 2 (two) times daily. (Patient taking differently: Take 1 capsule by mouth daily. ) 180 capsule 3  . Soya Lecithin 1200 MG CAPS Take 1 tablet by mouth daily.      No current facility-administered medications on file prior to visit.     No Known Allergies  Past Medical History:  Diagnosis Date  . Arthritis   . Confusion   . Diabetes mellitus without complication ([place])   . Hepatitis   . Hypertension   . Lumbar stenosis   . Memory loss   . Nocturia   .  Numbness and tingling    legs and feet  . Wears glasses     Past Surgical History:  Procedure Laterality Date  . [place]  . COLONOSCOPY    . HEMORRHOID SURGERY  2015  . TRANSFORAMINAL LUMBAR INTERBODY FUSION (TLIF) WITH PEDICLE SCREW FIXATION 2 LEVEL N/A 11/07/2015   Procedure: Lumbar three-four Lumbar four-five  transforaminal lumbar interbody fusion with interbody prosthesis posterior lateral arthrodesis and posterior segmental instrumentation;  Surgeon: Newman Pies, MD;  Location: [facility];  Service: Neurosurgery;  Laterality: N/A;    Family History  Problem Relation Age of Onset  . Hypertension Other   . Cancer Other        ovarian  . Cancer Mother   . Heart disease Father     Social  History   Socioeconomic History  . Marital status: Divorced    Spouse name: Not on file  . Number of children: 4  . Years of education: Not on file  . Highest education level: Not on file  Social Needs  . Financial resource strain: Not on file  . Food insecurity - worry: Not on file  . Food insecurity - inability: Not on file  . Transportation needs - medical: Not on file  . Transportation needs - non-medical: Not on file  Occupational History  . Not on file  Tobacco Use  . Smoking status: Never Smoker  . Smokeless tobacco: Never Used  Substance and Sexual Activity  . Alcohol use: No    Comment: rarely  . Drug use: No  . Sexual activity: Not on file  Other Topics Concern  . Not on file  Social History Narrative   Has a living will.   Son is health care POA   Would accept resuscitation.  Would not want prolonged life support if futile.   Probably would not want tube feeds if cognitively unaware   Review of Systems Occasional dizziness Sleeps okay Appetite is good Weight up slightly    Objective:   Physical Exam  Constitutional: No distress.  Neck: No thyromegaly present.  Cardiovascular: Normal rate, regular rhythm, normal heart sounds and intact distal pulses. Exam reveals no gallop.  No murmur heard. Pulmonary/Chest: Effort normal and breath sounds normal. No respiratory distress. She has no wheezes. She has no rales.  Musculoskeletal: She exhibits no edema.  Lymphadenopathy:    She has no cervical adenopathy.  Skin:  No foot lesions  Psychiatric:  No overt depression but clearly frustrated          Assessment & Plan:

## 2017-08-04 NOTE — Assessment & Plan Note (Signed)
Chronic pain without relief from multiple pain consultations Discussed trying to just use the medications and avoid doctors

## 2017-08-04 NOTE — Assessment & Plan Note (Signed)
Chronic dysthymia largely related to pain Continues on duloxetine for mood and pain

## 2017-08-04 NOTE — Assessment & Plan Note (Signed)
Seems to still have good control Due for labs

## 2017-08-04 NOTE — Assessment & Plan Note (Signed)
BP Readings from Last 3 Encounters:  08/04/17 126/76  05/07/17 122/74  01/20/17 122/84   Good control

## 2017-08-06 ENCOUNTER — Encounter: Payer: Self-pay | Admitting: Internal Medicine

## 2017-08-07 ENCOUNTER — Encounter: Payer: Self-pay | Admitting: Internal Medicine

## 2017-08-21 ENCOUNTER — Other Ambulatory Visit: Payer: Self-pay | Admitting: Internal Medicine

## 2017-08-27 ENCOUNTER — Encounter: Payer: Self-pay | Admitting: Internal Medicine

## 2017-08-31 ENCOUNTER — Encounter: Payer: Self-pay | Admitting: Internal Medicine

## 2017-09-04 ENCOUNTER — Other Ambulatory Visit: Payer: Self-pay | Admitting: Internal Medicine

## 2017-09-04 ENCOUNTER — Encounter: Payer: Self-pay | Admitting: Internal Medicine

## 2017-09-06 ENCOUNTER — Encounter: Payer: Self-pay | Admitting: Internal Medicine

## 2017-09-07 NOTE — Telephone Encounter (Signed)
I cannot rule out infection by looking at those pictures  Probably best if she is seen in UC or ED since we are closed now through tomorrow   Please let her know sh  Will cc to [facility] and [employer]

## 2017-09-11 ENCOUNTER — Ambulatory Visit (INDEPENDENT_AMBULATORY_CARE_PROVIDER_SITE_OTHER): Payer: Medicare Other | Admitting: Internal Medicine

## 2017-09-11 ENCOUNTER — Encounter: Payer: Self-pay | Admitting: Internal Medicine

## 2017-09-11 VITALS — BP 134/78 | HR 96 | Temp 98.1°F | Wt 175.0 lb

## 2017-09-11 DIAGNOSIS — W19XXXA Unspecified fall, initial encounter: Secondary | ICD-10-CM | POA: Diagnosis not present

## 2017-09-11 DIAGNOSIS — Y92009 Unspecified place in unspecified non-institutional (private) residence as the place of occurrence of the external cause: Secondary | ICD-10-CM | POA: Diagnosis not present

## 2017-09-11 DIAGNOSIS — E118 Type 2 diabetes mellitus with unspecified complications: Secondary | ICD-10-CM | POA: Diagnosis not present

## 2017-09-11 DIAGNOSIS — L03116 Cellulitis of left lower limb: Secondary | ICD-10-CM

## 2017-09-11 MED ORDER — CEPHALEXIN 250 MG PO CAPS: 250.0000 mg | ORAL_CAPSULE | Freq: Three times a day (TID) | ORAL | 0 refills | Status: DC

## 2017-09-11 NOTE — Progress Notes (Signed)
Subjective:    Patient ID: [name], female    DOB: [birth year], 72 y.o.   MRN: [id number]  HPI  Pt presents to the clinic today with c/o left leg bruising, redness and swelling. This started 10 days ago after she fell down some stairs. She reports initially, there was just pain, which resolved with ice and elevated. She noticed the redness and swelling started 5 days ago. She denies fever, chills or body aches. She has elevated her legs but has not tried anything OTC. She does have a history of DM 2.  Review of Systems      Past Medical History:  Diagnosis Date  . Arthritis   . Confusion   . Diabetes mellitus without complication ([place])   . Hepatitis   . Hypertension   . Lumbar stenosis   . Memory loss   . Nocturia   . Numbness and tingling    legs and feet  . Wears glasses     Current Outpatient Medications  Medication Sig Dispense Refill  . acetaminophen (TYLENOL) 500 MG tablet Take 1,000 mg by mouth every 8 (eight) hours as needed.    Marland Kitchen Apoaequorin (PREVAGEN) 10 MG CAPS Take 0.5 tablets by mouth daily.    . Ascorbic Acid (VITAMIN C WITH ROSE HIPS) 1000 MG tablet Take 1,000 mg by mouth daily.    . Blood Glucose Monitoring Suppl (ACCU-CHEK AVIVA PLUS) w/Device KIT Use to test blood sugar once daily Dx: R73.03 1 kit 0  . Cinnamon ([employer] CINNAMON) 500 MG capsule Take 1 capsule (500 mg total) by mouth 2 (two) times daily. (Patient taking differently: Take 1,000 mg by mouth daily. ) 180 capsule 3  . Coenzyme Q10 (CO Q 10 PO) Take 1 capsule by mouth daily.    . cyanocobalamin 500 MCG tablet Take 500 mcg by mouth daily.    . DULoxetine (CYMBALTA) 60 MG capsule TAKE 1 CAPSULE (60 MG TOTAL) BY MOUTH 2 (TWO) TIMES DAILY. 180 capsule 3  . gabapentin (NEURONTIN) 300 MG capsule Take 2 cap twice a day and 3 caps at bedtime 1 capsule 0  . gabapentin (NEURONTIN) 300 MG capsule TAKE ONE CAPSULE BY MOUTH TWICE A DAY TAKE 3 CAPSULES AT BEDTIME 150 capsule 5  . glucose blood (ACCU-CHEK  AVIVA PLUS) test strip Use to test blood sugar once daily Dx: R73.03 100 each 1  . Lancets (ACCU-CHEK SOFT TOUCH) lancets Use as instructed 100 each 12  . lisinopril-hydrochlorothiazide (PRINZIDE,ZESTORETIC) 20-25 MG tablet TAKE 2 TABLETS BY MOUTH DAILY 180 tablet 3  . meloxicam (MOBIC) 7.5 MG tablet TAKE 1 TABLET BY MOUTH EVERY DAY 90 tablet 1  . metFORMIN (GLUCOPHAGE-XR) 500 MG 24 hr tablet TAKE 1 TABLET (500 MG TOTAL) BY MOUTH DAILY WITH BREAKFAST. 90 tablet 3  . Multiple Vitamins-Minerals (MULTIVITAMIN WITH MINERALS) tablet Take 1 tablet by mouth daily.     . NON FORMULARY Calcium 250 mg+ Magnesium 250 mg + Potassium 45 mg Takes 1 tablets daily.    . Omega-3 Fatty Acids (FISH OIL) 1000 MG CAPS Take 2 capsules by mouth daily.     . Probiotic Product ([employer] SENIOR PROBIOTIC) CAPS Take 1 capsule by mouth 2 (two) times daily. (Patient taking differently: Take 1 capsule by mouth daily. ) 180 capsule 3  . Soya Lecithin 1200 MG CAPS Take 1 tablet by mouth daily.      No current facility-administered medications for this visit.     No Known Allergies  Family History  Problem  Relation Age of Onset  . Hypertension Other   . Cancer Other        ovarian  . Cancer Mother   . Heart disease Father     Social History   Socioeconomic History  . Marital status: Divorced    Spouse name: Not on file  . Number of children: 4  . Years of education: Not on file  . Highest education level: Not on file  Social Needs  . Financial resource strain: Not on file  . Food insecurity - worry: Not on file  . Food insecurity - inability: Not on file  . Transportation needs - medical: Not on file  . Transportation needs - non-medical: Not on file  Occupational History  . Not on file  Tobacco Use  . Smoking status: Never Smoker  . Smokeless tobacco: Never Used  Substance and Sexual Activity  . Alcohol use: No    Comment: rarely  . Drug use: No  . Sexual activity: Not on file  Other Topics Concern  .  Not on file  Social History Narrative   Has a living will.   Son is health care POA   Would accept resuscitation.  Would not want prolonged life support if futile.   Probably would not want tube feeds if cognitively unaware     Constitutional: Denies fever, malaise, fatigue, headache or abrupt weight changes.  Musculoskeletal: Denies decrease in range of motion, difficulty with gait, muscle pain or joint pain and swelling.  Skin: Pt reports left leg bruising, swelling and redness. Denies rashes, lesions or ulcercations.    No other specific complaints in a complete review of systems (except as listed in HPI above).. Objective:   Physical Exam  BP 134/78   Pulse 96   Temp 98.1 F (36.7 C) (Oral)   Wt 175 lb (79.4 kg)   SpO2 94%   BMI 33.07 kg/m  Wt Readings from Last 3 Encounters:  09/11/17 175 lb (79.4 kg)  08/04/17 172 lb 8 oz (78.2 kg)  05/07/17 165 lb (74.8 kg)    General: Appears her stated age, in NAD. Skin: 16 cm x 13 cm wound to LLE. Outside edges are warm, swollen and tender to touch. She has a 2.5 cm blood filled blister to left lateral calf. She has a 1 cm and 0.5 cm abrasion just above the left lateral malleolus.  Musculoskeletal: Left leg with 1+ pitting edema. No difficulty with gait.    BMET    Component Value Date/Time   NA 138 08/04/2017 1135   K 4.1 08/04/2017 1135   CL 99 08/04/2017 1135   CO2 30 08/04/2017 1135   GLUCOSE 92 08/04/2017 1135   BUN 28 (H) 08/04/2017 1135   CREATININE 0.96 08/04/2017 1135   CALCIUM 9.9 08/04/2017 1135   GFRNONAA >60 11/08/2015 0520   GFRAA >60 11/08/2015 0520    Lipid Panel     Component Value Date/Time   CHOL 222 (H) 08/04/2017 1135   TRIG 174.0 (H) 08/04/2017 1135   HDL 72.20 08/04/2017 1135   CHOLHDL 3 08/04/2017 1135   VLDL 34.8 08/04/2017 1135   LDLCALC 115 (H) 08/04/2017 1135    CBC    Component Value Date/Time   WBC 8.9 08/04/2017 1135   RBC 4.26 08/04/2017 1135   HGB 14.8 08/04/2017 1135    HCT 43.5 08/04/2017 1135   PLT 295.0 08/04/2017 1135   MCV 102.1 (H) 08/04/2017 1135   MCH 32.6 11/08/2015 0520   MCHC  33.9 08/04/2017 1135   RDW 13.4 08/04/2017 1135   LYMPHSABS 1.7 01/20/2017 1254   MONOABS 0.8 01/20/2017 1254   EOSABS 0.0 01/20/2017 1254   BASOSABS 0.0 01/20/2017 1254    Hgb A1C Lab Results  Component Value Date   HGBA1C 5.8 08/04/2017            Assessment & Plan:   Cellulitis of LLE s/p Fall:  Encouraged elevation eRx for Keflex 250 mg TID x 7 days  Return/ER precautions given Webb Silversmith, NP

## 2017-09-11 NOTE — Patient Instructions (Signed)

## 2017-09-12 ENCOUNTER — Encounter: Payer: Self-pay | Admitting: Internal Medicine

## 2017-09-14 ENCOUNTER — Encounter: Payer: Self-pay | Admitting: Internal Medicine

## 2017-09-16 ENCOUNTER — Encounter: Payer: Self-pay | Admitting: Internal Medicine

## 2017-09-17 NOTE — Telephone Encounter (Signed)
Please call her If the redness, etc is worse---she should be seen again. Can add on tomorrow for me---or see if Rollene Fare can recheck her today

## 2017-09-17 NOTE — Telephone Encounter (Signed)
Patient called back, called rena, she advised me to let patient know the nurse will be calling her to get the appt set up.

## 2017-09-17 NOTE — Telephone Encounter (Signed)
Spoke to pt. Made appt with Dr Silvio Pate 09-18-16 at 1230.

## 2017-09-18 ENCOUNTER — Encounter: Payer: Self-pay | Admitting: Internal Medicine

## 2017-09-18 ENCOUNTER — Ambulatory Visit (INDEPENDENT_AMBULATORY_CARE_PROVIDER_SITE_OTHER): Payer: Medicare Other | Admitting: Internal Medicine

## 2017-09-18 VITALS — BP 124/80 | HR 86 | Temp 97.5°F

## 2017-09-18 DIAGNOSIS — S8012XD Contusion of left lower leg, subsequent encounter: Secondary | ICD-10-CM

## 2017-09-18 DIAGNOSIS — S8012XA Contusion of left lower leg, initial encounter: Secondary | ICD-10-CM | POA: Insufficient documentation

## 2017-09-18 NOTE — Assessment & Plan Note (Addendum)
Cellulitis is resolved Discussed antibiotic ointment and gentle cleansing for open area (Rx done today for her) Discussed time course---weeks--for resolution She will watch for heat and tenderness as sign of recurrent infection

## 2017-09-18 NOTE — Progress Notes (Signed)
Subjective:    Patient ID: [name], female    DOB: [birth year], 72 y.o.   MRN: [id number]  HPI Here for follow up on cellulitis  Had gone out the door early--there was some ice on the steps--back before Christmas Slid down the steps No bleeding or skin breaks---but there was apparent bruising  Then noted black area on lower lateral left calf Was red, hot and painful Swelling as well Came and saw Rollene Fare the next day-- diagnosed with cellulitis Just finished the cephalexin  Swelling, warmth and tenderness has not recurred  Was on her feet a lot yesterday--some swelling (better after elevation)  Neighbor noted fluid in one spot and drained yellow stuff Told her it was infected  Current Outpatient Medications on File Prior to Visit  Medication Sig Dispense Refill  . acetaminophen (TYLENOL) 500 MG tablet Take 1,000 mg by mouth every 8 (eight) hours as needed.    Marland Kitchen Apoaequorin (PREVAGEN) 10 MG CAPS Take 0.5 tablets by mouth daily.    . Blood Glucose Monitoring Suppl (ACCU-CHEK AVIVA PLUS) w/Device KIT Use to test blood sugar once daily Dx: R73.03 1 kit 0  . Coenzyme Q10 (CO Q 10 PO) Take 1 capsule by mouth daily.    . cyanocobalamin 500 MCG tablet Take 500 mcg by mouth daily.    . DULoxetine (CYMBALTA) 60 MG capsule TAKE 1 CAPSULE (60 MG TOTAL) BY MOUTH 2 (TWO) TIMES DAILY. 180 capsule 3  . gabapentin (NEURONTIN) 300 MG capsule TAKE ONE CAPSULE BY MOUTH TWICE A DAY TAKE 3 CAPSULES AT BEDTIME (Patient taking differently: TAKE TWO CAPSULES BY MOUTH TWICE A DAY TAKE 3 CAPSULES AT BEDTIME) 150 capsule 5  . glucose blood (ACCU-CHEK AVIVA PLUS) test strip Use to test blood sugar once daily Dx: R73.03 100 each 1  . Lancets (ACCU-CHEK SOFT TOUCH) lancets Use as instructed 100 each 12  . lisinopril-hydrochlorothiazide (PRINZIDE,ZESTORETIC) 20-25 MG tablet TAKE 2 TABLETS BY MOUTH DAILY 180 tablet 3  . meloxicam (MOBIC) 7.5 MG tablet TAKE 1 TABLET BY MOUTH EVERY DAY 90 tablet 1  .  metFORMIN (GLUCOPHAGE-XR) 500 MG 24 hr tablet TAKE 1 TABLET (500 MG TOTAL) BY MOUTH DAILY WITH BREAKFAST. 90 tablet 3  . Multiple Vitamins-Minerals (MULTIVITAMIN WITH MINERALS) tablet Take 1 tablet by mouth daily.     . Omega-3 Fatty Acids (FISH OIL) 1000 MG CAPS Take 2 capsules by mouth daily.     . Probiotic Product ([employer] SENIOR PROBIOTIC) CAPS Take 1 capsule by mouth 2 (two) times daily. (Patient taking differently: Take 1 capsule by mouth daily. ) 180 capsule 3  . Soya Lecithin 1200 MG CAPS Take 1 tablet by mouth daily.      No current facility-administered medications on file prior to visit.     No Known Allergies  Past Medical History:  Diagnosis Date  . Arthritis   . Confusion   . Diabetes mellitus without complication ([place])   . Hepatitis   . Hypertension   . Lumbar stenosis   . Memory loss   . Nocturia   . Numbness and tingling    legs and feet  . Wears glasses     Past Surgical History:  Procedure Laterality Date  . [place]  . COLONOSCOPY    . HEMORRHOID SURGERY  2015  . TRANSFORAMINAL LUMBAR INTERBODY FUSION (TLIF) WITH PEDICLE SCREW FIXATION 2 LEVEL N/A 11/07/2015   Procedure: Lumbar three-four Lumbar four-five  transforaminal lumbar interbody fusion with interbody prosthesis posterior lateral  arthrodesis and posterior segmental instrumentation;  Surgeon: Newman Pies, MD;  Location: [facility];  Service: Neurosurgery;  Laterality: N/A;    Family History  Problem Relation Age of Onset  . Hypertension Other   . Cancer Other        ovarian  . Cancer Mother   . Heart disease Father     Social History   Socioeconomic History  . Marital status: Divorced    Spouse name: Not on file  . Number of children: 4  . Years of education: Not on file  . Highest education level: Not on file  Social Needs  . Financial resource strain: Not on file  . Food insecurity - worry: Not on file  . Food insecurity - inability: Not on file  . Transportation  needs - medical: Not on file  . Transportation needs - non-medical: Not on file  Occupational History  . Not on file  Tobacco Use  . Smoking status: Never Smoker  . Smokeless tobacco: Never Used  Substance and Sexual Activity  . Alcohol use: No    Comment: rarely  . Drug use: No  . Sexual activity: Not on file  Other Topics Concern  . Not on file  Social History Narrative   Has a living will.   Son is health care POA   Would accept resuscitation.  Would not want prolonged life support if futile.   Probably would not want tube feeds if cognitively unaware   Review of Systems No fever No vomiting or diarrhea Eating okay    Objective:   Physical Exam  Skin:  8 x 4 cm contusion on lateral lower left calf. Some black eschar and 1 small open area on lower portion---without inflammation  Left calf ~3 x 3cm contusion without inflammation or open areas          Assessment & Plan:

## 2017-09-26 ENCOUNTER — Encounter: Payer: Self-pay | Admitting: Internal Medicine

## 2017-09-28 NOTE — Telephone Encounter (Signed)
Please get her a spot with someone tomorrow or add on with me on Wednesday at 78

## 2017-09-29 ENCOUNTER — Encounter: Payer: Self-pay | Admitting: Internal Medicine

## 2017-09-29 ENCOUNTER — Ambulatory Visit (INDEPENDENT_AMBULATORY_CARE_PROVIDER_SITE_OTHER): Payer: Medicare Other | Admitting: Internal Medicine

## 2017-09-29 VITALS — BP 138/82 | HR 101 | Temp 97.6°F | Wt 179.8 lb

## 2017-09-29 DIAGNOSIS — L97921 Non-pressure chronic ulcer of unspecified part of left lower leg limited to breakdown of skin: Secondary | ICD-10-CM

## 2017-09-29 DIAGNOSIS — J029 Acute pharyngitis, unspecified: Secondary | ICD-10-CM | POA: Diagnosis not present

## 2017-09-29 DIAGNOSIS — L97909 Non-pressure chronic ulcer of unspecified part of unspecified lower leg with unspecified severity: Secondary | ICD-10-CM | POA: Insufficient documentation

## 2017-09-29 NOTE — Progress Notes (Signed)
Subjective:    Patient ID: [name], female    DOB: [birth year], 72 y.o.   MRN: [id number]  HPI Here for recheck of leg wound that has persisted  Seemed to get "hole" in leg  Spraying with antiseptic and covering Not overly painful--chronic neuropathy pain and not much worse  Now with sore throat No sig fever or SOB Slight cough  Current Outpatient Medications on File Prior to Visit  Medication Sig Dispense Refill  . acetaminophen (TYLENOL) 500 MG tablet Take 1,000 mg by mouth every 8 (eight) hours as needed.    Marland Kitchen Apoaequorin (PREVAGEN) 10 MG CAPS Take 0.5 tablets by mouth daily.    . Blood Glucose Monitoring Suppl (ACCU-CHEK AVIVA PLUS) w/Device KIT Use to test blood sugar once daily Dx: R73.03 1 kit 0  . Coenzyme Q10 (CO Q 10 PO) Take 1 capsule by mouth daily.    . cyanocobalamin 500 MCG tablet Take 500 mcg by mouth daily.    . DULoxetine (CYMBALTA) 60 MG capsule TAKE 1 CAPSULE (60 MG TOTAL) BY MOUTH 2 (TWO) TIMES DAILY. 180 capsule 3  . gabapentin (NEURONTIN) 300 MG capsule TAKE ONE CAPSULE BY MOUTH TWICE A DAY TAKE 3 CAPSULES AT BEDTIME (Patient taking differently: TAKE TWO CAPSULES BY MOUTH TWICE A DAY TAKE 3 CAPSULES AT BEDTIME) 150 capsule 5  . glucose blood (ACCU-CHEK AVIVA PLUS) test strip Use to test blood sugar once daily Dx: R73.03 100 each 1  . Lancets (ACCU-CHEK SOFT TOUCH) lancets Use as instructed 100 each 12  . lisinopril-hydrochlorothiazide (PRINZIDE,ZESTORETIC) 20-25 MG tablet TAKE 2 TABLETS BY MOUTH DAILY 180 tablet 3  . meloxicam (MOBIC) 7.5 MG tablet TAKE 1 TABLET BY MOUTH EVERY DAY 90 tablet 1  . metFORMIN (GLUCOPHAGE-XR) 500 MG 24 hr tablet TAKE 1 TABLET (500 MG TOTAL) BY MOUTH DAILY WITH BREAKFAST. 90 tablet 3  . Multiple Vitamins-Minerals (MULTIVITAMIN WITH MINERALS) tablet Take 1 tablet by mouth daily.     . Omega-3 Fatty Acids (FISH OIL) 1000 MG CAPS Take 2 capsules by mouth daily.     . Probiotic Product ([employer] SENIOR PROBIOTIC) CAPS Take 1 capsule by  mouth 2 (two) times daily. (Patient taking differently: Take 1 capsule by mouth daily. ) 180 capsule 3  . Soya Lecithin 1200 MG CAPS Take 1 tablet by mouth daily.      No current facility-administered medications on file prior to visit.     No Known Allergies  Past Medical History:  Diagnosis Date  . Arthritis   . Confusion   . Diabetes mellitus without complication ([place])   . Hepatitis   . Hypertension   . Lumbar stenosis   . Memory loss   . Nocturia   . Numbness and tingling    legs and feet  . Wears glasses     Past Surgical History:  Procedure Laterality Date  . [place]  . COLONOSCOPY    . HEMORRHOID SURGERY  2015  . TRANSFORAMINAL LUMBAR INTERBODY FUSION (TLIF) WITH PEDICLE SCREW FIXATION 2 LEVEL N/A 11/07/2015   Procedure: Lumbar three-four Lumbar four-five  transforaminal lumbar interbody fusion with interbody prosthesis posterior lateral arthrodesis and posterior segmental instrumentation;  Surgeon: Newman Pies, MD;  Location: [facility];  Service: Neurosurgery;  Laterality: N/A;    Family History  Problem Relation Age of Onset  . Hypertension Other   . Cancer Other        ovarian  . Cancer Mother   . Heart disease Father  Social History   Socioeconomic History  . Marital status: Divorced    Spouse name: Not on file  . Number of children: 4  . Years of education: Not on file  . Highest education level: Not on file  Social Needs  . Financial resource strain: Not on file  . Food insecurity - worry: Not on file  . Food insecurity - inability: Not on file  . Transportation needs - medical: Not on file  . Transportation needs - non-medical: Not on file  Occupational History  . Not on file  Tobacco Use  . Smoking status: Never Smoker  . Smokeless tobacco: Never Used  Substance and Sexual Activity  . Alcohol use: No    Comment: rarely  . Drug use: No  . Sexual activity: Not on file  Other Topics Concern  . Not on file  Social  History Narrative   Has a living will.   Son is health care POA   Would accept resuscitation.  Would not want prolonged life support if futile.   Probably would not want tube feeds if cognitively unaware   Review of Systems Occasional stomach trouble in AM--?from high protein shakes Appetite is okay    Objective:   Physical Exam  HENT:  Slight pharyngeal injection No exudate  Neck: No thyromegaly present.  Pulmonary/Chest: Effort normal. No respiratory distress. She has no wheezes. She has no rales.  Lymphadenopathy:    She has no cervical adenopathy.  Skin:  4x4 cm ulcer lower left leg. Partially covered with necrotic tissue or old blood Not infected Smaller lesion under this  Closed bruise medial right leg--not inflamed          Assessment & Plan:

## 2017-09-29 NOTE — Assessment & Plan Note (Signed)
From traumatic injury with hematoma that is now sloughing Will need debridement and regular care Discussed wound clinic Redressed here with topical antibiotic and gauze---okay if pulls some of the dead tissue off with dressings till seen at wound clinic (discussed)

## 2017-09-29 NOTE — Assessment & Plan Note (Signed)
Seems to be self limited viral infection Discussed supportive care

## 2017-09-30 ENCOUNTER — Encounter: Payer: Medicare Other | Attending: Internal Medicine | Admitting: Internal Medicine

## 2017-09-30 DIAGNOSIS — S81801A Unspecified open wound, right lower leg, initial encounter: Secondary | ICD-10-CM | POA: Diagnosis not present

## 2017-09-30 DIAGNOSIS — L97218 Non-pressure chronic ulcer of right calf with other specified severity: Secondary | ICD-10-CM | POA: Diagnosis not present

## 2017-09-30 DIAGNOSIS — E1140 Type 2 diabetes mellitus with diabetic neuropathy, unspecified: Secondary | ICD-10-CM | POA: Insufficient documentation

## 2017-09-30 DIAGNOSIS — Z794 Long term (current) use of insulin: Secondary | ICD-10-CM | POA: Insufficient documentation

## 2017-09-30 DIAGNOSIS — E11622 Type 2 diabetes mellitus with other skin ulcer: Secondary | ICD-10-CM | POA: Diagnosis not present

## 2017-09-30 DIAGNOSIS — M1990 Unspecified osteoarthritis, unspecified site: Secondary | ICD-10-CM | POA: Diagnosis not present

## 2017-09-30 DIAGNOSIS — L97223 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e: Secondary | ICD-10-CM | POA: Diagnosis not present

## 2017-09-30 DIAGNOSIS — I10 Essential (primary) hypertension: Secondary | ICD-10-CM | POA: Diagnosis not present

## 2017-09-30 DIAGNOSIS — L97222 Non-pressure chronic ulcer of left calf with fat layer exposed: Secondary | ICD-10-CM | POA: Diagnosis not present

## 2017-10-01 NOTE — Progress Notes (Signed)
ARDENA, GANGL ([id number]) Visit Report for 09/30/2017 Allergy List Details Patient Name: [name], [name]. Date of Service: 09/30/2017 10:30 AM Medical Record Number: [id number] Patient Account Number: [id number] Date of Birth/Sex: 1946/01/25 (71 y.o. Female) Treating RN: Carolyne Fiscal, Debi Primary Care Jelani Vreeland: Viviana Simpler Other Clinician: Referring Berry Gallacher: Viviana Simpler Treating Shail Urbas/Extender: Ricard Dillon Weeks in Treatment: 0 Allergies Active Allergies NKDA Allergy Notes Electronic Signature(s) Signed: 09/30/2017 4:10:40 PM By: Alric Quan Entered By: Alric Quan on 09/30/2017 10:50:12 [name] ([id number]) -------------------------------------------------------------------------------- Arrival Information Details Patient Name: [name]. Date of Service: 09/30/2017 10:30 AM Medical Record Number: [id number] Patient Account Number: [id number] Date of Birth/Sex: [birth year] (71 y.o. Female) Treating RN: Carolyne Fiscal, Debi Primary Care Loyd Salvador: Viviana Simpler Other Clinician: Referring Alexandre Faries: Viviana Simpler Treating Damontre Millea/Extender: Tito Dine in Treatment: 0 Visit Information Patient Arrived: Ambulatory Arrival Time: 10:45 Accompanied By: self Transfer Assistance: None Patient Identification Verified: Yes Secondary Verification Process Completed: Yes Patient Requires Transmission-Based No Precautions: Patient Has Alerts: Yes Patient Alerts: DM II Electronic Signature(s) Signed: 09/30/2017 4:10:40 PM By: Alric Quan Entered By: Alric Quan on 09/30/2017 10:45:42 [name], [name] ([id number]) -------------------------------------------------------------------------------- Clinic Level of Care Assessment Details Patient Name: [name]. Date of Service: 09/30/2017 10:30 AM Medical Record Number: [id number] Patient Account Number: [id number] Date of Birth/Sex: 10-05-1945 (71 y.o. Female) Treating RN:  Carolyne Fiscal, Debi Primary Care Shivaay Stormont: Viviana Simpler Other Clinician: Referring Lataysha Vohra: Viviana Simpler Treating Telicia Hodgkiss/Extender: Tito Dine in Treatment: 0 Clinic Level of Care Assessment Items TOOL 1 Quantity Score X - Use when EandM and Procedure is performed on INITIAL visit 1 0 ASSESSMENTS - Nursing Assessment / Reassessment X - General Physical Exam (combine w/ comprehensive assessment (listed just below) when 1 20 performed on new pt. evals) X- 1 25 Comprehensive Assessment (HX, ROS, Risk Assessments, Wounds Hx, etc.) ASSESSMENTS - Wound and Skin Assessment / Reassessment []  - Dermatologic / Skin Assessment (not related to wound area) 0 ASSESSMENTS - Ostomy and/or Continence Assessment and Care []  - Incontinence Assessment and Management 0 []  - 0 Ostomy Care Assessment and Management (repouching, etc.) PROCESS - Coordination of Care X - Simple Patient / Family Education for ongoing care 1 15 []  - 0 Complex (extensive) Patient / Family Education for ongoing care []  - 0 Staff obtains Programmer, systems, Records, Test Results / Process Orders []  - 0 Staff telephones HHA, Nursing Homes / Clarify orders / etc []  - 0 Routine Transfer to another Facility (non-emergent condition) []  - 0 Routine Hospital Admission (non-emergent condition) X- 1 15 New Admissions / Biomedical engineer / Ordering NPWT, Apligraf, etc. []  - 0 Emergency Hospital Admission (emergent condition) PROCESS - Special Needs []  - Pediatric / Minor Patient Management 0 []  - 0 Isolation Patient Management []  - 0 Hearing / Language / Visual special needs []  - 0 Assessment of Community assistance (transportation, D/C planning, etc.) []  - 0 Additional assistance / Altered mentation []  - 0 Support Surface(s) Assessment (bed, cushion, seat, etc.) [name], [name] ([id number]) INTERVENTIONS - Miscellaneous []  - External ear exam 0 []  - 0 Patient Transfer (multiple staff / Civil Service fast streamer / Similar  devices) []  - 0 Simple Staple / Suture removal (25 or less) []  - 0 Complex Staple / Suture removal (26 or more) []  - 0 Hypo/Hyperglycemic Management (do not check if billed separately) []  - 0 Ankle / Brachial Index (ABI) - do not check if billed separately Has the patient been seen at the hospital within the last three years:  Yes Total Score: 75 Level Of Care: New/Established - Level 2 Electronic Signature(s) Signed: 09/30/2017 4:10:40 PM By: Alric Quan Entered By: Alric Quan on 09/30/2017 13:35:31 [name], [name] ([id number]) -------------------------------------------------------------------------------- Encounter Discharge Information Details Patient Name: [name]. Date of Service: 09/30/2017 10:30 AM Medical Record Number: [id number] Patient Account Number: [id number] Date of Birth/Sex: 1946-02-17 (71 y.o. Female) Treating RN: Carolyne Fiscal, Debi Primary Care Terrion Poblano: Viviana Simpler Other Clinician: Referring Jeanene Mena: Viviana Simpler Treating Starling Christofferson/Extender: Tito Dine in Treatment: 0 Encounter Discharge Information Items Discharge Pain Level: 0 Discharge Condition: Stable Ambulatory Status: Ambulatory Discharge Destination: Home Transportation: Private Auto Accompanied By: self Schedule Follow-up Appointment: Yes Medication Reconciliation completed and No provided to Patient/Care Dacey Milberger: Provided on Clinical Summary of Care: 09/30/2017 Form Type Recipient Paper Patient LK Electronic Signature(s) Signed: 09/30/2017 1:34:18 PM By: Alric Quan Entered By: Alric Quan on 09/30/2017 13:34:18 [name], [name] ([id number]) -------------------------------------------------------------------------------- Lower Extremity Assessment Details Patient Name: [name]. Date of Service: 09/30/2017 10:30 AM Medical Record Number: [id number] Patient Account Number: [id number] Date of Birth/Sex: 06/14/1946 (71 y.o. Female) Treating  RN: Carolyne Fiscal, Debi Primary Care Jyrah Blye: Viviana Simpler Other Clinician: Referring Kimani Bedoya: Viviana Simpler Treating Jacara Benito/Extender: Tito Dine in Treatment: 0 Edema Assessment Assessed: [Left: No] [Right: No] [Left: Edema] [Right: :] Calf Left: Right: Point of Measurement: 34 cm From Medial Instep 38 cm 37 cm Ankle Left: Right: Point of Measurement: 9 cm From Medial Instep 25 cm 26 cm Vascular Assessment Pulses: Dorsalis Pedis Palpable: [Left:Yes] [Right:Yes] Doppler Audible: [Left:Yes] [Right:Yes] Posterior Tibial Palpable: [Left:Yes] [Right:Yes] Doppler Audible: [Left:Yes] [Right:Yes] Popliteal Palpable: [Left:Yes] [Right:Yes] Extremity colors, hair growth, and conditions: Extremity Color: [Left:Red] [Right:Red] Hair Growth on Extremity: [Left:Yes] [Right:Yes] Temperature of Extremity: [Left:Cool] [Right:Cool] Capillary Refill: [Left:> 3 seconds] [Right:> 3 seconds] Toe Nail Assessment Left: Right: Thick: Yes Yes Discolored: No No Deformed: No No Improper Length and Hygiene: Yes Yes Notes unable to do ABIs due to where wounds are and pain Electronic Signature(s) Signed: 09/30/2017 4:10:40 PM By: Alric Quan Entered By: Alric Quan on 09/30/2017 11:23:18 Negron, [name] ([id number]) Romana Juniper, [name] ([id number]) -------------------------------------------------------------------------------- Multi Wound Chart Details Patient Name: [name]. Date of Service: 09/30/2017 10:30 AM Medical Record Number: [id number] Patient Account Number: [id number] Date of Birth/Sex: January 06, 1946 (71 y.o. Female) Treating RN: Carolyne Fiscal, Debi Primary Care Chantry Headen: Viviana Simpler Other Clinician: Referring Philbert Ocallaghan: Viviana Simpler Treating Gianne Shugars/Extender: Tito Dine in Treatment: 0 Vital Signs Height(in): 61 Pulse(bpm): 50 Weight(lbs): 179.1 Blood Pressure(mmHg): 162/91 Body Mass Index(BMI): 34 Temperature(F):  98.2 Respiratory Rate 18 (breaths/min): Photos: [1:No Photos] [2:No Photos] [N/A:N/A] Wound Location: [1:Right Lower Leg - Medial] [2:Left, Proximal, Lateral Lower Leg] [N/A:N/A] Wounding Event: [1:Trauma] [2:Trauma] [N/A:N/A] Primary Etiology: [1:Diabetic Wound/Ulcer of the Lower Extremity] [2:Diabetic Wound/Ulcer of the Lower Extremity] [N/A:N/A] Secondary Etiology: [1:Trauma, Other] [2:Trauma, Other] [N/A:N/A] Comorbid History: [1:Hypertension, Type II Diabetes, Osteoarthritis, Neuropathy] [2:Hypertension, Type II Diabetes, Osteoarthritis, Neuropathy] [N/A:N/A] Date Acquired: [1:08/30/2017] [2:08/30/2017] [N/A:N/A] Weeks of Treatment: [1:0] [2:0] [N/A:N/A] Wound Status: [1:Open] [2:Open] [N/A:N/A] Measurements L x W x D [1:3x3.5x0.1] [2:6.3x4x0.7] [N/A:N/A] (cm) Area (cm) : [1:8.247] [2:19.792] [N/A:N/A] Volume (cm) : [1:0.825] [2:13.854] [N/A:N/A] % Reduction in Area: [1:N/A] [2:-57.50%] [N/A:N/A] % Reduction in Volume: [1:N/A] [2:-57.50%] [N/A:N/A] Starting Position 1 [2:5] (o'clock): Ending Position 1 [2:10] (o'clock): Maximum Distance 1 (cm): [2:4.3] Undermining: [1:No] [2:Yes] [N/A:N/A] Classification: [1:Grade 1] [2:Grade 2] [N/A:N/A] Exudate Amount: [1:Large] [2:Large] [N/A:N/A] Exudate Type: [1:Serosanguineous] [2:Serosanguineous] [N/A:N/A] Exudate Color: [1:red, brown] [2:red, brown] [N/A:N/A] Wound Margin: [1:Distinct, outline attached] [2:Distinct,  outline attached] [N/A:N/A] Granulation Amount: [1:None Present (0%)] [2:Small (1-33%)] [N/A:N/A] Granulation Quality: [1:N/A] [2:Red] [N/A:N/A] Necrotic Amount: [1:Large (67-100%)] [2:Large (67-100%)] [N/A:N/A] Necrotic Tissue: [1:Eschar] [2:Eschar, Adherent Slough] [N/A:N/A] Exposed Structures: [1:Fascia: No Fat Layer (Subcutaneous Tissue) Exposed: No] [2:Fascia: No Fat Layer (Subcutaneous Tissue) Exposed: No] [N/A:N/A] Tendon: No Tendon: No Muscle: No Muscle: No Joint: No Joint: No Bone: No Bone:  No Epithelialization: None None N/A Debridement: N/A Debridement ([id number]) N/A Pre-procedure N/A 11:34 N/A Verification/Time Out Taken: Pain Control: N/A Lidocaine 4% Topical Solution N/A Tissue Debrided: N/A Fibrin/Slough, Exudates, N/A Subcutaneous Level: N/A Skin/Subcutaneous Tissue N/A Debridement Area (sq cm): N/A 25.2 N/A Instrument: N/A Blade, Curette, Forceps N/A Bleeding: N/A Minimum N/A Hemostasis Achieved: N/A Pressure N/A Procedural Pain: N/A 0 N/A Post Procedural Pain: N/A 0 N/A Debridement Treatment N/A Procedure was tolerated well N/A Response: Post Debridement N/A 6.3x4x0.8 N/A Measurements L x W x D (cm) Post Debridement Volume: N/A 15.834 N/A (cm) Periwound Skin Texture: No Abnormalities Noted No Abnormalities Noted N/A Periwound Skin Moisture: No Abnormalities Noted No Abnormalities Noted N/A Periwound Skin Color: Erythema: Yes Erythema: Yes N/A Erythema Location: Circumferential Circumferential N/A Temperature: No Abnormality No Abnormality N/A Tenderness on Palpation: Yes Yes N/A Wound Preparation: Ulcer Cleansing: Ulcer Cleansing: N/A Rinsed/Irrigated with Saline Rinsed/Irrigated with Saline Topical Anesthetic Applied: Topical Anesthetic Applied: Other: lidocaine 4% Other: lidocaine 4% Procedures Performed: N/A Debridement N/A Treatment Notes Electronic Signature(s) Signed: 09/30/2017 1:33:54 PM By: Alric Quan Entered By: Alric Quan on 09/30/2017 13:33:54 Milsap, [name] ([id number]) -------------------------------------------------------------------------------- [place] Details Patient Name: [name]. Date of Service: 09/30/2017 10:30 AM Medical Record Number: [id number] Patient Account Number: [id number] Date of Birth/Sex: [birth year] (71 y.o. Female) Treating RN: Carolyne Fiscal, Debi Primary Care Oliana Gowens: Viviana Simpler Other Clinician: Referring Kimberlin Scheel: Viviana Simpler Treating Jasline Buskirk/Extender:  Tito Dine in Treatment: 0 Active Inactive ` Abuse / Safety / Falls / Self Care Management Nursing Diagnoses: History of Falls Potential for falls Goals: Patient will not experience any injury related to falls Date Initiated: 09/30/2017 Target Resolution Date: 01/16/2018 Goal Status: Active Interventions: Assess Activities of Daily Living upon admission and as needed Assess fall risk on admission and as needed Assess: immobility, friction, shearing, incontinence upon admission and as needed Assess impairment of mobility on admission and as needed per policy Notes: ` Nutrition Nursing Diagnoses: Imbalanced nutrition Impaired glucose control: actual or potential Potential for alteratiion in Nutrition/Potential for imbalanced nutrition Goals: Patient/caregiver agrees to and verbalizes understanding of need to use nutritional supplements and/or vitamins as prescribed Date Initiated: 09/30/2017 Target Resolution Date: 01/16/2018 Goal Status: Active Patient/caregiver will maintain therapeutic glucose control Date Initiated: 09/30/2017 Target Resolution Date: 01/16/2018 Goal Status: Active Interventions: Assess patient nutrition upon admission and as needed per policy Provide education on elevated blood sugars and impact on wound healing Notes: TSION, INGHRAM ([id number]) Orientation to the Wound Care Program Nursing Diagnoses: Knowledge deficit related to the wound healing center program Goals: Patient/caregiver will verbalize understanding of the [place] Date Initiated: 09/30/2017 Target Resolution Date: 10/17/2017 Goal Status: Active Interventions: Provide education on orientation to the wound center Notes: ` Pain, Acute or Chronic Nursing Diagnoses: Pain, acute or chronic: actual or potential Potential alteration in comfort, pain Goals: Patient/caregiver will verbalize adequate pain control between visits Date Initiated:  09/30/2017 Target Resolution Date: 01/16/2018 Goal Status: Active Interventions: Complete pain assessment as per visit requirements Notes: ` Wound/Skin Impairment Nursing Diagnoses: Impaired tissue integrity Knowledge deficit related to ulceration/compromised skin integrity Goals:  Ulcer/skin breakdown will have a volume reduction of 80% by week 12 Date Initiated: 09/30/2017 Target Resolution Date: 01/16/2018 Goal Status: Active Interventions: Assess patient/caregiver ability to perform ulcer/skin care regimen upon admission and as needed Assess ulceration(s) every visit Notes: Electronic Signature(s) Signed: 09/30/2017 1:33:37 PM By: Alric Quan Entered By: Alric Quan on 09/30/2017 13:33:36 [place], [name] ([id number]) Romana Juniper, [name] ([id number]) -------------------------------------------------------------------------------- Pain Assessment Details Patient Name: [name]. Date of Service: 09/30/2017 10:30 AM Medical Record Number: [id number] Patient Account Number: [id number] Date of Birth/Sex: August 13, 1946 (71 y.o. Female) Treating RN: Carolyne Fiscal, Debi Primary Care Jveon Pound: Viviana Simpler Other Clinician: Referring Addalyne Vandehei: Viviana Simpler Treating Denasia Venn/Extender: Tito Dine in Treatment: 0 Active Problems Location of Pain Severity and Description of Pain Patient Has Paino No Site Locations Pain Management and Medication Current Pain Management: Electronic Signature(s) Signed: 09/30/2017 4:10:40 PM By: Alric Quan Entered By: Alric Quan on 09/30/2017 10:46:08 [name] ([id number]) -------------------------------------------------------------------------------- Patient/Caregiver Education Details Patient Name: [name]. Date of Service: 09/30/2017 10:30 AM Medical Record Number: [id number] Patient Account Number: [id number] Date of Birth/Gender: 11-Aug-1946 (71 y.o. Female) Treating RN: Ahmed Prima Primary  Care Physician: Viviana Simpler Other Clinician: Referring Physician: Viviana Simpler Treating Physician/Extender: Tito Dine in Treatment: 0 Education Assessment Education Provided To: Patient Education Topics Provided Elevated Blood Sugar/ Impact on Healing: Handouts: Elevated Blood Sugars: How Do They Affect Wound Healing Methods: Explain/Verbal Responses: State content correctly Nutrition: Handouts: Elevated Blood Sugars: How Do They Affect Wound Healing, Nutrition Methods: Explain/Verbal Responses: State content correctly Welcome To [facility]: Handouts: Welcome To [facility] Methods: Explain/Verbal Responses: State content correctly Wound/Skin Impairment: Handouts: Caring for Your Ulcer, Other: change dressing as ordered Methods: Demonstration, Explain/Verbal Responses: State content correctly Electronic Signature(s) Signed: 09/30/2017 4:10:40 PM By: Alric Quan Entered By: Alric Quan on 09/30/2017 13:34:59 [place], [name] ([id number]) -------------------------------------------------------------------------------- Wound Assessment Details Patient Name: [name]. Date of Service: 09/30/2017 10:30 AM Medical Record Number: [id number] Patient Account Number: [id number] Date of Birth/Sex: Apr 18, 1946 (71 y.o. Female) Treating RN: Carolyne Fiscal, Debi Primary Care Quitman Norberto: Viviana Simpler Other Clinician: Referring Miking Usrey: Viviana Simpler Treating Kaniah Rizzolo/Extender: Tito Dine in Treatment: 0 Wound Status Wound Number: 1 Primary Etiology: Diabetic Wound/Ulcer of the Lower Extremity Wound Location: Right Lower Leg - Medial Secondary Trauma, Other Wounding Event: Trauma Etiology: Date Acquired: 08/30/2017 Wound Status: Open Weeks Of Treatment: 0 Comorbid Hypertension, Type II Diabetes, Clustered Wound: No History: Osteoarthritis, Neuropathy Photos Photo Uploaded By: Alric Quan on 09/30/2017  14:26:46 Wound Measurements Length: (cm) 3 Width: (cm) 3.5 Depth: (cm) 0.1 Area: (cm) 8.247 Volume: (cm) 0.825 % Reduction in Area: % Reduction in Volume: Epithelialization: None Tunneling: No Undermining: No Wound Description Classification: Grade 1 Wound Margin: Distinct, outline attached Exudate Amount: Large Exudate Type: Serosanguineous Exudate Color: red, brown Foul Odor After Cleansing: No Slough/Fibrino Yes Wound Bed Granulation Amount: None Present (0%) Exposed Structure Necrotic Amount: Large (67-100%) Fascia Exposed: No Necrotic Quality: Eschar Fat Layer (Subcutaneous Tissue) Exposed: No Tendon Exposed: No Muscle Exposed: No Joint Exposed: No Bone Exposed: No Periwound Skin Texture Fleece, [name] ([id number]) Texture Color No Abnormalities Noted: No No Abnormalities Noted: No Erythema: Yes Moisture Erythema Location: Circumferential No Abnormalities Noted: No Temperature / Pain Temperature: No Abnormality Tenderness on Palpation: Yes Wound Preparation Ulcer Cleansing: Rinsed/Irrigated with Saline Topical Anesthetic Applied: Other: lidocaine 4%, Electronic Signature(s) Signed: 09/30/2017 4:10:40 PM By: Alric Quan Entered By: Alric Quan on 09/30/2017 11:16:38 Sochacki, [name] (  [id number]) -------------------------------------------------------------------------------- Wound Assessment Details Patient Name: [name], DEBLOIS. Date of Service: 09/30/2017 10:30 AM Medical Record Number: [id number] Patient Account Number: [id number] Date of Birth/Sex: Aug 20, 1946 (71 y.o. Female) Treating RN: Carolyne Fiscal, Debi Primary Care Mahiya Kercheval: Viviana Simpler Other Clinician: Referring Kabe Mckoy: Viviana Simpler Treating Kelleen Stolze/Extender: Tito Dine in Treatment: 0 Wound Status Wound Number: 2 Primary Etiology: Diabetic Wound/Ulcer of the Lower Extremity Wound Location: Left, Proximal, Lateral Lower Leg Secondary Trauma,  Other Wounding Event: Trauma Etiology: Date Acquired: 08/30/2017 Wound Status: Open Weeks Of Treatment: 0 Comorbid Hypertension, Type II Diabetes, Clustered Wound: No History: Osteoarthritis, Neuropathy Photos Photo Uploaded By: Alric Quan on 09/30/2017 14:27:30 Wound Measurements Length: (cm) 6.3 Width: (cm) 4 Depth: (cm) 0.7 Area: (cm) 19.792 Volume: (cm) 13.854 % Reduction in Area: -57.5% % Reduction in Volume: -57.5% Epithelialization: None Tunneling: No Undermining: Yes Starting Position (o'clock): 5 Ending Position (o'clock): 10 Maximum Distance: (cm) 4.3 Wound Description Classification: Grade 2 Wound Margin: Distinct, outline attached Exudate Amount: Large Exudate Type: Serosanguineous Exudate Color: red, brown Foul Odor After Cleansing: No Slough/Fibrino Yes Wound Bed Granulation Amount: Small (1-33%) Exposed Structure Granulation Quality: Red Fascia Exposed: No Necrotic Amount: Large (67-100%) Fat Layer (Subcutaneous Tissue) Exposed: No Necrotic Quality: Eschar, Adherent Slough Tendon Exposed: No Muscle Exposed: No Economou, [name] ([id number]) Joint Exposed: No Bone Exposed: No Periwound Skin Texture Texture Color No Abnormalities Noted: No No Abnormalities Noted: No Erythema: Yes Moisture Erythema Location: Circumferential No Abnormalities Noted: No Temperature / Pain Temperature: No Abnormality Tenderness on Palpation: Yes Wound Preparation Ulcer Cleansing: Rinsed/Irrigated with Saline Topical Anesthetic Applied: Other: lidocaine 4%, Electronic Signature(s) Signed: 09/30/2017 4:10:40 PM By: Alric Quan Entered By: Alric Quan on 09/30/2017 11:49:18 Heppler, [name] ([id number]) -------------------------------------------------------------------------------- [place] Details Patient Name: [name]. Date of Service: 09/30/2017 10:30 AM Medical Record Number: [id number] Patient Account Number: [id number] Date of  Birth/Sex: 04-Dec-1945 (71 y.o. Female) Treating RN: Carolyne Fiscal, Debi Primary Care Cherilyn Sautter: Viviana Simpler Other Clinician: Referring Draiden Mirsky: Viviana Simpler Treating Carla Whilden/Extender: Tito Dine in Treatment: 0 Vital Signs Time Taken: 10:46 Temperature (F): 98.2 Height (in): 61 Pulse (bpm): 73 Source: Stated Respiratory Rate (breaths/min): 18 Weight (lbs): 179.1 Blood Pressure (mmHg): 162/91 Source: Measured Reference Range: 80 - 120 mg / dl Body Mass Index (BMI): 33.8 Electronic Signature(s) Signed: 09/30/2017 4:10:40 PM By: Alric Quan Entered By: Alric Quan on 09/30/2017 10:49:10

## 2017-10-01 NOTE — Progress Notes (Signed)
Alexis, IGOE Velez ([id number]) Visit Report for 09/30/2017 Chief Complaint Document Details Patient Name: [name], [name]. Date of Service: 09/30/2017 10:30 AM Medical Record Number: [id number] Patient Account Number: [id number] Date of Birth/Sex: [birth year] (71 y.o. Female) Treating RN: Carolyne Fiscal, Debi Primary Care Provider: Viviana Simpler Other Clinician: Referring Provider: Viviana Simpler Treating Provider/Extender: Tito Dine in Treatment: 0 Information Obtained from: Patient Chief Complaint 09/30/17; patient is here for review of traumatic wounds on the left lateral and right medial lower legs Electronic Signature(s) Signed: 09/30/2017 5:00:32 PM By: Linton Ham MD Entered By: Linton Ham on 09/30/2017 12:22:46 Kroner, [name] ([id number]) -------------------------------------------------------------------------------- Debridement Details Patient Name: [name]. Date of Service: 09/30/2017 10:30 AM Medical Record Number: [id number] Patient Account Number: [id number] Date of Birth/Sex: 1946-02-19 (71 y.o. Female) Treating RN: Carolyne Fiscal, Debi Primary Care Provider: Viviana Simpler Other Clinician: Referring Provider: Viviana Simpler Treating Provider/Extender: Tito Dine in Treatment: 0 Debridement Performed for Wound #2 Left,Lateral Lower Leg Assessment: Performed By: Physician Ricard Dillon, MD Debridement: Debridement Severity of Tissue Pre Fat layer exposed Debridement: Pre-procedure Verification/Time Yes - 11:34 Out Taken: Start Time: 11:35 Pain Control: Lidocaine 4% Topical Solution Level: Skin/Subcutaneous Tissue Total Area Debrided (L x W): 6.3 (cm) x 4 (cm) = 25.2 (cm) Tissue and other material Viable, Non-Viable, Exudate, Fibrin/Slough, Subcutaneous debrided: Instrument: Blade, Curette, Forceps Bleeding: Minimum Hemostasis Achieved: Pressure End Time: 11:38 Procedural Pain: 0 Post Procedural Pain: 0 Response  to Treatment: Procedure was tolerated well Post Debridement Measurements of Total Wound Length: (cm) 6.3 Width: (cm) 4 Depth: (cm) 0.8 Volume: (cm) 15.834 Character of Wound/Ulcer Post Debridement: Requires Further Debridement Severity of Tissue Post Debridement: Fat layer exposed Post Procedure Diagnosis Same as Pre-procedure Electronic Signature(s) Signed: 09/30/2017 4:10:40 PM By: Alric Quan Signed: 09/30/2017 5:00:32 PM By: Linton Ham MD Entered By: Linton Ham on 09/30/2017 12:19:20 Nair, [name] ([id number]) -------------------------------------------------------------------------------- HPI Details Patient Name: [name]. Date of Service: 09/30/2017 10:30 AM Medical Record Number: [id number] Patient Account Number: [id number] Date of Birth/Sex: 1945-12-13 (71 y.o. Female) Treating RN: Carolyne Fiscal, Debi Primary Care Provider: Viviana Simpler Other Clinician: Referring Provider: Viviana Simpler Treating Provider/Extender: Tito Dine in Treatment: 0 History of Present Illness HPI Description: 09/30/17; patient is a 72 year old woman with type 2 diabetes and peripheral neuropathy. On 08/30/17 she fell down some steps. She developed redness and swelling and was seen in her primary physician's office on 09/11/17. She was felt to have cellulitis and given Keflex. She will return for follow-up on 09/18/17 noted to have an 8 x 4 cm contusion on the left lateral lower leg and a 3 x 3 cm contusion on the right medial lower leg. At follow-up on 09/29/16 she had a 4 x 4 centimeter necrotic surface and she's been referred here. The patient is not on blood thinners she does not have a history of falling. She states her diabetes is under good control ABIs in this clinic could not be obtained because of pain. Patient does not have a prior wound history Electronic Signature(s) Signed: 09/30/2017 5:00:32 PM By: Linton Ham MD Entered By: Linton Ham on  09/30/2017 12:24:25 [name] ([id number]) -------------------------------------------------------------------------------- Physical Exam Details Patient Name: [name]. Date of Service: 09/30/2017 10:30 AM Medical Record Number: [id number] Patient Account Number: [id number] Date of Birth/Sex: 1946-03-17 (71 y.o. Female) Treating RN: Carolyne Fiscal, Debi Primary Care Provider: Viviana Simpler Other Clinician: Referring Provider: Viviana Simpler Treating Provider/Extender: Tito Dine in Treatment: 0 Constitutional Patient  is hypertensive.. Pulse regular and within target range for patient.Marland Kitchen Respirations regular, non-labored and within target range.. Temperature is normal and within the target range for the patient.Marland Kitchen appears in no distress. Eyes Conjunctivae clear. No discharge. Respiratory Respiratory effort is easy and symmetric bilaterally. Rate is normal at rest and on room air.. Cardiovascular Heart rhythm and rate regular, without murmur or gallop.. Femoral arteries without bruits and pulses strong.. Pedal pulses bilaterally.. Edema present in both extremities.however this is mild likely related to chronic venous insufficiency. Lymphatic none palpable in the popliteal or inguinal area. Integumentary (Hair, Skin) no rashes seen. No primary skin issues. Neurological mild neuropathy. Psychiatric No evidence of depression, anxiety, or agitation. Calm, cooperative, and communicative. Appropriate interactions and affect.. Notes wound exam; the most substantial area is on the lateral left leg. Large area covered in dead skin necrotic subcutaneous tissue surfacing over gelatinous old blood. Using up a cup and scalpel to the skin and necrotic subcutaneous tissue was removed. Then using a curette gelatinous old blood removed to a deep but satisfactory wound. Below this was a small satellite area however with debridement of this there is clear connection with the  superior larger wound and there is a considerable amount of inferior tunneling below this oOn the right medial lower leg she still has intact hematoma with viable skin. There is some discoloration around this however right now I think this is all potentially viable. Electronic Signature(s) Signed: 09/30/2017 5:00:32 PM By: Linton Ham MD Entered By: Linton Ham on 09/30/2017 12:27:05 [name] ([id number]) -------------------------------------------------------------------------------- Physician Orders Details Patient Name: [name]. Date of Service: 09/30/2017 10:30 AM Medical Record Number: [id number] Patient Account Number: [id number] Date of Birth/Sex: [birth year] (71 y.o. Female) Treating RN: Carolyne Fiscal, Debi Primary Care Provider: Viviana Simpler Other Clinician: Referring Provider: Viviana Simpler Treating Provider/Extender: Tito Dine in Treatment: 0 Verbal / Phone Orders: Yes Clinician: Pinkerton, Debi Read Back and Verified: Yes Diagnosis Coding Wound Cleansing Wound #1 Right,Medial Lower Leg o Clean wound with Normal Saline. o Cleanse wound with mild soap and water Wound #2 Left,Lateral Lower Leg o Clean wound with Normal Saline. o Cleanse wound with mild soap and water Anesthetic (add to Medication List) Wound #1 Right,Medial Lower Leg o Topical Lidocaine 4% cream applied to wound bed prior to debridement (In Clinic Only). Wound #2 Left,Lateral Lower Leg o Topical Lidocaine 4% cream applied to wound bed prior to debridement (In Clinic Only). Skin Barriers/Peri-Wound Care Wound #2 Left,Lateral Lower Leg o Barrier cream Primary Wound Dressing Wound #2 Left,Lateral Lower Leg o Silvercel Non-Adherent Secondary Dressing Wound #1 Right,Medial Lower Leg o ABD pad o Foam o XtraSorb Wound #2 Left,Lateral Lower Leg o ABD pad o XtraSorb Dressing Change Frequency Wound #1 Right,Medial Lower Leg o Other: -  Twice a week and PRN Wound #2 Left,Lateral Lower Leg o Other: - Twice a week and PRN Follow-up Appointments DREANA, BRITZ ([id number]) Wound #1 Right,Medial Lower Leg o Nurse Visit as needed o Other: - Friday Nursing visit Wound #2 Left,Lateral Lower Leg o Nurse Visit as needed o Other: - Friday Nursing visit Edema Control Wound #1 Right,Medial Lower Leg o Kerlix and Coban - Bilateral - unna to anchor o Elevate legs to the level of the heart and pump ankles as often as possible Wound #2 Left,Lateral Lower Leg o Kerlix and Coban - Bilateral - unna to anchor o Elevate legs to the level of the heart and pump ankles as often as possible Additional Orders /  Instructions Wound #1 Right,Medial Lower Leg o Increase protein intake. o Other: - Vitamin C, Zinc Wound #2 Left,Lateral Lower Leg o Increase protein intake. o Other: - Vitamin C, Zinc Patient Medications Allergies: NKDA Notifications Medication Indication Start End lidocaine DOSE 1 - topical 4 % cream - 1 cream topical Electronic Signature(s) Signed: 09/30/2017 4:10:40 PM By: Alric Quan Signed: 09/30/2017 5:00:32 PM By: Linton Ham MD Entered By: Alric Quan on 09/30/2017 12:10:24 [name] ([id number]) -------------------------------------------------------------------------------- Prescription 09/30/2017 Patient Name: [name]. Provider: Ricard Dillon MD Date of Birth: [birth year] NPI#: [id number] Sex: F DEA#: [id number] Phone #: [phone] License #: [id number] Patient Address: [address] [place], [place] [address] [place], [place] [address] [phone] Allergies NKDA Medication Medication: Route: Strength: Form: lidocaine topical 4% cream Class: TOPICAL LOCAL ANESTHETICS Dose: Frequency / Time: Indication: 1 1 cream topical Number of  Refills: Number of Units: 0 Generic Substitution: Start Date: End Date: Administered at [facility]: Yes Time Administered: Time Discontinued: Note to Pharmacy: Signature(s): Date(s): Electronic Signature(s) Signed: 09/30/2017 4:10:40 PM By: Alric Quan Signed: 09/30/2017 5:00:32 PM By: Linton Ham MD Entered By: Alric Quan on 09/30/2017 12:10:24 [name] ([id number]) Romana Juniper, [name] ([id number]) --------------------------------------------------------------------------------  Problem List Details Patient Name: [name]. Date of Service: 09/30/2017 10:30 AM Medical Record Number: [id number] Patient Account Number: [id number] Date of Birth/Sex: 08/23/1946 (71 y.o. 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 Date of Service: 09/30/2017 10:30 AM Medical Record Number: [id number] Patient Account Number: [id number] Date of Birth/Sex: 1946/05/20 (71 y.o. Female) Treating RN: Carolyne Fiscal, Debi Primary Care Provider: Viviana Simpler Other Clinician: Referring Provider: Viviana Simpler Treating Provider/Extender: Tito Dine in Treatment: 0 Subjective Chief Complaint Information obtained from Patient 09/30/17; patient is here for review of traumatic wounds on the left lateral and right medial lower legs History of Present Illness (HPI) 09/30/17; patient is a 72 year old woman with type 2 diabetes and peripheral neuropathy. On 08/30/17 she fell down some steps. She developed redness and swelling and was seen in her primary physician's office on 09/11/17. She was felt to have cellulitis and given Keflex. She will return for follow-up on 09/18/17 noted to have an 8 x 4 cm contusion on the left lateral lower leg and a 3 x 3 cm contusion on the right medial lower leg. At follow-up on 09/29/16 she had a 4 x 4 centimeter necrotic surface and she's been referred here. The patient is not on blood thinners she does not have a history of falling. She states her diabetes is under good control ABIs in this clinic could not be obtained because of pain. Patient does not have a prior wound history Wound History Patient presents with 2 open wounds that have been present for approximately 08/30/17. Patient has been treating wounds in the following manner: bandaging. Laboratory  tests have not been performed in the last month. Patient reportedly has not tested positive for an antibiotic resistant organism. Patient reportedly has not tested positive for osteomyelitis. Patient reportedly has not had testing performed to evaluate circulation in the legs. Patient experiences the following problems associated with their wounds: swelling. Patient History Information obtained from Patient. Allergies NKDA Family History Cancer - Mother, Heart Disease - Father, No family history of Diabetes, Hereditary Spherocytosis, Hypertension, Kidney Disease, Lung Disease, Seizures, Stroke, Thyroid Problems, Tuberculosis. Social History Never smoker, Marital Status - Divorced, Alcohol Use - Rarely, Drug Use - No History, Caffeine Use -  Daily. Medical History Cardiovascular Patient has history of Hypertension Endocrine Patient has history of Type II Diabetes Musculoskeletal Patient has history of Osteoarthritis Neurologic Patient has history of Neuropathy ANDRELL, TALLMAN. ([id number]) Patient is treated with Insulin, Oral Agents. Blood sugar is not tested. Review of Systems (ROS) Constitutional Symptoms (General Health) The patient has no complaints or symptoms. Eyes Complains or has symptoms of Glasses / Contacts. Hematologic/Lymphatic The patient has no complaints or symptoms. Respiratory The patient has no complaints or symptoms. Gastrointestinal pt has hepatitis but unsure of which one Genitourinary nocturia Immunological The patient has no complaints or symptoms. Integumentary (Skin) Complains or has symptoms of Wounds. Neurologic memory loss confusion Oncologic The patient has no complaints or symptoms. Psychiatric Complains or has symptoms of Anxiety, depression Objective Constitutional Patient is hypertensive.. Pulse regular and within target range for patient.Marland Kitchen Respirations regular, non-labored and within target range.. Temperature is normal and within the target range for the patient.Marland Kitchen appears in no distress. Vitals Time Taken: 10:46 AM, Height: 61 in, Source: Stated, Weight: 179.1 lbs, Source: Measured, BMI: 33.8, Temperature: 98.2 F, Pulse: 73 bpm, Respiratory Rate: 18 breaths/min, Blood Pressure: 162/91 mmHg. Eyes Conjunctivae clear. No discharge. Respiratory Respiratory effort is easy and symmetric bilaterally. Rate is normal at rest and on room air.. Cardiovascular Heart rhythm and rate regular, without murmur or gallop.. Femoral arteries without bruits and pulses strong.. Pedal pulses bilaterally.. Edema present in both extremities.however this is mild likely related to chronic venous insufficiency. Lymphatic none palpable in the popliteal or inguinal area. Neurological TRISSA, MOLINA. ([id number]) mild neuropathy. Psychiatric No evidence of depression, anxiety, or agitation. Calm, cooperative, and communicative. Appropriate interactions and affect.. General Notes: wound exam; the most substantial area is on the lateral left leg. Large area covered in dead skin necrotic subcutaneous tissue surfacing over gelatinous old blood. Using up a cup and scalpel to the skin and necrotic subcutaneous tissue was removed. Then using a curette gelatinous old blood removed to a deep but satisfactory wound. Below this was a small satellite area however with debridement of this there is clear connection with the superior larger wound and there is a considerable amount of inferior tunneling below this On the right medial lower leg she still has intact hematoma with viable skin. There is some discoloration around this however right now I think this is all potentially viable. Integumentary (Hair, Skin) no rashes seen. No primary skin issues. Wound #1 status is Open. Original cause of wound was Trauma. The wound is located on the Right,Medial Lower Leg. The wound measures 3cm length x 3.5cm width x 0.1cm depth; 8.247cm^2 area and 0.825cm^3 volume. There is no tunneling or undermining noted. There is a large amount of serosanguineous drainage noted. The wound margin is distinct with the outline attached to the wound base. There is no granulation within the wound bed. There is a large (67-100%)  amount of necrotic tissue within the wound bed including Eschar. The periwound skin appearance exhibited: Erythema. The surrounding wound skin color is noted with erythema which is circumferential. Periwound temperature was noted as No Abnormality. The periwound has tenderness on palpation. Wound #2 status is Open. Original cause of wound was Trauma. The wound is located on the Left,Lateral Lower Leg. The wound measures 6.3cm length x 4cm width x 0.7cm depth; 19.792cm^2 area and 13.854cm^3 volume. There  is no tunneling noted, however, there is undermining starting at 5:00 and ending at 10:00 with a maximum distance of 4.3cm. There is a large amount of serosanguineous drainage noted. The wound margin is distinct with the outline attached to the wound base. There is small (1-33%) red granulation within the wound bed. There is a large (67-100%) amount of necrotic tissue within the wound bed including Eschar and Adherent Slough. The periwound skin appearance exhibited: Erythema. The surrounding wound skin color is noted with erythema which is circumferential. Periwound temperature was noted as No Abnormality. The periwound has tenderness on palpation. 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 There was a Skin/Subcutaneous Tissue Debridement ([id number] 11047) debridement with total area of 25.2 sq cm performed by Ricard Dillon, MD. with the following instrument(s): Blade, Curette, and Forceps to remove Viable and Non-Viable tissue/material including Exudate, Fibrin/Slough, and Subcutaneous after achieving pain control using Lidocaine 4% Topical Solution. A time out was conducted at 11:34, prior to [facility]. ([id number]) the start of the procedure. A Minimum amount of bleeding was controlled with Pressure. The procedure was tolerated well with a pain level of 0 throughout and a pain level of 0 following the procedure. Post Debridement Measurements: 6.3cm length x 4cm width x 0.8cm depth; 15.834cm^3 volume. Character of Wound/Ulcer Post Debridement requires further debridement.  Severity of Tissue Post Debridement is: Fat layer exposed. Post procedure Diagnosis Wound #2: Same as Pre-Procedure Plan Wound Cleansing: Wound #1 Right,Medial Lower Leg: Clean wound with Normal Saline. Cleanse wound with mild soap and water Wound #2 Left,Lateral Lower Leg: Clean wound with Normal Saline. Cleanse wound with mild soap and water Anesthetic (add to Medication List): Wound #1 Right,Medial Lower Leg: Topical Lidocaine 4% cream applied to wound bed prior to debridement (In Clinic Only). Wound #2 Left,Lateral Lower Leg: Topical Lidocaine 4% cream applied to wound bed prior to debridement (In Clinic Only). Skin Barriers/Peri-Wound Care: Wound #2 Left,Lateral Lower Leg: Barrier cream Primary Wound Dressing: Wound #2 Left,Lateral Lower Leg: Silvercel Non-Adherent Secondary Dressing: Wound #1 Right,Medial Lower Leg: ABD pad Foam XtraSorb Wound #2 Left,Lateral Lower Leg: ABD pad XtraSorb Dressing Change Frequency: Wound #1 Right,Medial Lower Leg: Other: - Twice a week and PRN Wound #2 Left,Lateral Lower Leg: Other: - Twice a week and PRN Follow-up Appointments: Wound #1 Right,Medial Lower Leg: Nurse Visit as needed Other: - Friday Nursing visit Wound #2 Left,Lateral Lower Leg: Nurse Visit as needed Other: - Friday Nursing visit Edema Control: Wound #1 Right,Medial Lower Leg: Kerlix and Coban - Bilateral - unna to anchor Elevate legs to the level of the heart and pump ankles as often as possible Wound #2 Left,Lateral Lower Leg: Kerlix  and Coban - Bilateral - unna to anchor Elevate legs to the level of the heart and pump ankles as often as possible Rahming, Aarohi M. ([id number]) Additional Orders / Instructions: Wound #1 Right,Medial Lower Leg: Increase protein intake. Other: - Vitamin C, Zinc Wound #2 Left,Lateral Lower Leg: Increase protein intake. Other: - Vitamin C, Zinc The following medication(s) was prescribed: lidocaine topical 4 % cream 1 1 cream  topical was prescribed at facility #1 the patient is going to have a long period of time to attempt to heal this area. There is considerable undermining going inferiorly to connect of the smaller wound and even this undermines inferiorly by 2 cm. Still a large amount of necrotic material over the surface of this. Primary dressing for now silver alginate with silver alginate rope packing of the smaller wound and a Kerlix Coban wrap. The skin between the large wound and the small inferior wound appears to be viable but there is a clear connection between the 2 open areas. Would wonder about a wound VAC on this ultimately #2 there is still intact hematoma on the right medial lower leg. The skin here still looks viable and for now nothing else other than compression. We'll see if this remains intact #3 the patient is not homebound she is not eligible for home health and we'll see about bringing area and I hear on Friday to change the dressing at least for now. We may need to revisit the issue of home health. I doubt the patient can change this dressing, she tells me she had a friend across the street helping her however this may be beyond her capabilities. Electronic Signature(s) Signed: 09/30/2017 5:00:32 PM By: Linton Ham MD Entered By: Linton Ham on 09/30/2017 12:30:44 Fargnoli, [name] ([id number]) -------------------------------------------------------------------------------- ROS/PFSH Details Patient Name: [name]. Date of Service: 09/30/2017 10:30 AM Medical Record Number: [id number] Patient Account Number: [id number] Date of Birth/Sex: Mar 23, 1946 (71 y.o. Female) Treating RN: Carolyne Fiscal, Debi Primary Care Provider: Viviana Simpler Other Clinician: Referring Provider: Viviana Simpler Treating Provider/Extender: Tito Dine in Treatment: 0 Information Obtained From Patient Wound History Do you currently have one or more open woundso Yes How many open wounds do  you currently haveo 2 Approximately how long have you had your woundso 08/30/17 How have you been treating your wound(s) until nowo bandaging Has your wound(s) ever healed and then re-openedo No Have you had any lab work done in the past montho No Have you tested positive for an antibiotic resistant organism (MRSA, VRE)o No Have you tested positive for osteomyelitis (bone infection)o No Have you had any tests for circulation on your legso No Have you had other problems associated with your woundso Swelling Eyes Complaints and Symptoms: Positive for: Glasses / Contacts Integumentary (Skin) Complaints and Symptoms: Positive for: Wounds Psychiatric Complaints and Symptoms: Positive for: Anxiety Review of System Notes: depression Constitutional Symptoms (General Health) Complaints and Symptoms: No Complaints or Symptoms Hematologic/Lymphatic Complaints and Symptoms: No Complaints or Symptoms Respiratory Complaints and Symptoms: No Complaints or Symptoms Cardiovascular JOZY, MCPHEARSON ([id number]) Medical History: Positive for: Hypertension Gastrointestinal Complaints and Symptoms: Review of System Notes: pt has hepatitis but unsure of which one Endocrine Medical History: Positive for: Type II Diabetes Time with diabetes: unsure Treated with: Insulin, Oral agents Blood sugar tested every day: No Genitourinary Complaints and Symptoms: Review of System Notes: nocturia Immunological Complaints and Symptoms: No Complaints or Symptoms Musculoskeletal Medical History: Positive for: Osteoarthritis Neurologic Complaints and Symptoms: Review of System Notes:  memory loss confusion Medical History: Positive for: Neuropathy Oncologic Complaints and Symptoms: No Complaints or Symptoms Immunizations Pneumococcal Vaccine: Received Pneumococcal Vaccination: No Implantable Devices Family and Social History Cancer: Yes - Mother; Diabetes: No; Heart Disease: Yes - Father;  Hereditary Spherocytosis: No; Hypertension: No; Kidney Disease: No; Lung Disease: No; Seizures: No; Stroke: No; Thyroid Problems: No; Tuberculosis: No; Never smoker; Marital Status - Divorced; Alcohol Use: Rarely; Drug Use: No History; Caffeine Use: Daily; Financial Concerns: No; Food, Clothing or BRODIE, SCOVELL. ([id number]) Shelter Needs: No; Support System Lacking: No; Transportation Concerns: No; Advanced Directives: No; Patient does not want information on Advanced Directives; Do not resuscitate: No; Living Will: Yes (Not Provided); Medical Power of Attorney: Yes - son (Not Provided) Electronic Signature(s) Signed: 09/30/2017 4:10:40 PM By: Alric Quan Signed: 09/30/2017 5:00:32 PM By: Linton Ham MD Entered By: Alric Quan on 09/30/2017 10:59:39 Myles, [name] ([id number]) -------------------------------------------------------------------------------- [facility] Details Patient Name: [name]. Date of Service: 09/30/2017 Medical Record Number: [id number] Patient Account Number: [id number] Date of Birth/Sex: May 30, 1946 (72 y.o. 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 E11.40 Type 2 diabetes mellitus with diabetic neuropathy, unspecified Facility Procedures CPT4 Code: 23557322 Description: [phone] - WOUND CARE VISIT-LEV 2 EST PT Modifier: Quantity: 1 CPT4 Code: 70623762 Description: 83151 - DEB SUBQ TISSUE 20 SQ CM/< ICD-10 Diagnosis Description 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 Modifier: le Quantity: 1 CPT4 Code: [id number] Description: 10626 - DEB SUBQ TISS EA ADDL 20CM  ICD-10 Diagnosis Description 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 Modifier: le Quantity: 1 Physician Procedures CPT4 Code: [id number] Description: WC PHYS LEVEL 3 o NEW PT ICD-10 Diagnosis Description 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 L97.218 Non-pressure chronic ulcer of right calf with other specified E11.40 Type 2 diabetes mellitus with diabetic  neuropathy, unspecified Modifier: 25 e severity Quantity: 1 CPT4 Code: 7035009 Description: 11042 - WC PHYS SUBQ TISS 20 SQ CM ICD-10 Diagnosis Description 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

## 2017-10-01 NOTE — Progress Notes (Signed)
DEVINN, VOSHELL ([id number]) Visit Report for 09/30/2017 Abuse/Suicide Risk Screen Details Patient Name: [name], [name]. Date of Service: 09/30/2017 10:30 AM Medical Record Number: [id number] Patient Account Number: [id number] Date of Birth/Sex: 11-23-1945 (71 y.o. Female) Treating RN: Ahmed Prima Primary Care Katelind Pytel: Viviana Simpler Other Clinician: Referring Hopelynn Gartland: Viviana Simpler Treating Sorren Vallier/Extender: Tito Dine in Treatment: 0 Abuse/Suicide Risk Screen Items Answer ABUSE/SUICIDE RISK SCREEN: Has anyone close to you tried to hurt or harm you recentlyo No Do you feel uncomfortable with anyone in your familyo No Has anyone forced you do things that you didnot want to doo No Do you have any thoughts of harming yourselfo No Patient displays signs or symptoms of abuse and/or neglect. No Electronic Signature(s) Signed: 09/30/2017 4:10:40 PM By: Alric Quan Entered By: Alric Quan on 09/30/2017 10:59:45 [name], [name] ([id number]) -------------------------------------------------------------------------------- Activities of Daily Living Details Patient Name: [name]. Date of Service: 09/30/2017 10:30 AM Medical Record Number: [id number] Patient Account Number: [id number] Date of Birth/Sex: 1946/06/11 (71 y.o. Female) Treating RN: Carolyne Fiscal, Debi Primary Care Ross Bender: Viviana Simpler Other Clinician: Referring Emmaleigh Longo: Viviana Simpler Treating Saron Vanorman/Extender: Tito Dine in Treatment: 0 Activities of Daily Living Items Answer Activities of Daily Living (Please select one for each item) Drive Automobile Completely Able Take Medications Completely Able Use Telephone Completely Able Care for Appearance Completely Able Use Toilet Completely Able Bath / Shower Completely Able Dress Self Completely Able Feed Self Completely Able Walk Completely Able Get In / Out Bed Completely Able Housework Completely Able Prepare  Meals Completely Able Handle Money Completely Able Shop for Self Completely Able Electronic Signature(s) Signed: 09/30/2017 4:10:40 PM By: Alric Quan Entered By: Alric Quan on 09/30/2017 11:00:06 [name] ([id number]) -------------------------------------------------------------------------------- Education Assessment Details Patient Name: [name]. Date of Service: 09/30/2017 10:30 AM Medical Record Number: [id number] Patient Account Number: [id number] Date of Birth/Sex: Nov 25, 1945 (71 y.o. Female) Treating RN: Carolyne Fiscal, Debi Primary Care Merville Hijazi: Viviana Simpler Other Clinician: Referring Koleen Celia: Viviana Simpler Treating Thula Stewart/Extender: Tito Dine in Treatment: 0 Primary Learner Assessed: Patient Learning Preferences/Education Level/Primary Language Learning Preference: Explanation, Printed Material Highest Education Level: College or Above Preferred Language: English Cognitive Barrier Assessment/Beliefs Language Barrier: No Translator Needed: No Memory Deficit: No Emotional Barrier: No Cultural/Religious Beliefs Affecting Medical Care: No Physical Barrier Assessment Impaired Vision: Yes Glasses Impaired Hearing: No Decreased Hand dexterity: No Knowledge/Comprehension Assessment Knowledge Level: Medium Comprehension Level: Medium Ability to understand written Medium instructions: Ability to understand verbal Medium instructions: Motivation Assessment Anxiety Level: Calm Cooperation: Cooperative Education Importance: Acknowledges Need Interest in Health Problems: Asks Questions Perception: Coherent Willingness to Engage in Self- Medium Management Activities: Readiness to Engage in Self- Medium Management Activities: Electronic Signature(s) Signed: 09/30/2017 4:10:40 PM By: Alric Quan Entered By: Alric Quan on 09/30/2017 11:00:42 [name]  ([id number]) -------------------------------------------------------------------------------- Fall Risk Assessment Details Patient Name: [name]. Date of Service: 09/30/2017 10:30 AM Medical Record Number: [id number] Patient Account Number: [id number] Date of Birth/Sex: November 17, 1945 (71 y.o. Female) Treating RN: Carolyne Fiscal, Debi Primary Care Amantha Sklar: Viviana Simpler Other Clinician: Referring Shalah Estelle: Viviana Simpler Treating Saylah Ketner/Extender: Tito Dine in Treatment: 0 Fall Risk Assessment Items Have you had 2 or more falls in the last 12 monthso 0 No Have you had any fall that resulted in injury in the last 12 monthso 0 Yes FALL RISK ASSESSMENT: History of falling - immediate or within 3 months 25 Yes Secondary diagnosis 15 Yes Ambulatory aid None/bed rest/wheelchair/nurse 0 No Crutches/cane/walker 0  No Furniture 0 No IV Access/Saline Lock 0 No Gait/Training Normal/bed rest/immobile 0 No Weak 0 No Impaired 0 No Mental Status Oriented to own ability 0 Yes Electronic Signature(s) Signed: 09/30/2017 4:10:40 PM By: Alric Quan Entered By: Alric Quan on 09/30/2017 11:01:07 [name] ([id number]) -------------------------------------------------------------------------------- Foot Assessment Details Patient Name: [name]. Date of Service: 09/30/2017 10:30 AM Medical Record Number: [id number] Patient Account Number: [id number] Date of Birth/Sex: 08-Aug-1946 (71 y.o. Female) Treating RN: Carolyne Fiscal, Debi Primary Care June Rode: Viviana Simpler Other Clinician: Referring Jakhi Dishman: Viviana Simpler Treating Leib Elahi/Extender: Tito Dine in Treatment: 0 Foot Assessment Items Site Locations + = Sensation present, - = Sensation absent, C = Callus, U = Ulcer R = Redness, W = Warmth, M = Maceration, PU = Pre-ulcerative lesion F = Fissure, S = Swelling, D = Dryness Assessment Right: Left: Other Deformity: No No Prior Foot Ulcer:  No No Prior Amputation: No No Charcot Joint: No No Ambulatory Status: Ambulatory Without Help Gait: Steady Electronic Signature(s) Signed: 09/30/2017 4:10:40 PM By: Alric Quan Entered By: Alric Quan on 09/30/2017 11:14:35 [name], [name] ([id number]) -------------------------------------------------------------------------------- Nutrition Risk Assessment Details Patient Name: [name]. Date of Service: 09/30/2017 10:30 AM Medical Record Number: [id number] Patient Account Number: [id number] Date of Birth/Sex: 29-Aug-1946 (71 y.o. Female) Treating RN: Carolyne Fiscal, Debi Primary Care Angellina Ferdinand: Viviana Simpler Other Clinician: Referring Eliot Popper: Viviana Simpler Treating Esteen Delpriore/Extender: Tito Dine in Treatment: 0 Height (in): 61 Weight (lbs): 179.1 Body Mass Index (BMI): 33.8 Nutrition Risk Assessment Items NUTRITION RISK SCREEN: I have an illness or condition that made me change the kind and/or amount of 0 No food I eat I eat fewer than two meals per day 0 No I eat few fruits and vegetables, or milk products 0 No I have three or more drinks of beer, liquor or wine almost every day 0 No I have tooth or mouth problems that make it hard for me to eat 0 No I don't always have enough money to buy the food I need 0 No I eat alone most of the time 1 Yes I take three or more different prescribed or over-the-counter drugs a day 1 Yes Without wanting to, I have lost or gained 10 pounds in the last six months 2 Yes I am not always physically able to shop, cook and/or feed myself 0 No Nutrition Protocols Good Risk Protocol Moderate Risk Protocol Electronic Signature(s) Signed: 09/30/2017 4:10:40 PM By: Alric Quan Entered By: Alric Quan on 09/30/2017 11:01:48

## 2017-10-02 DIAGNOSIS — M1990 Unspecified osteoarthritis, unspecified site: Secondary | ICD-10-CM | POA: Diagnosis not present

## 2017-10-02 DIAGNOSIS — Z794 Long term (current) use of insulin: Secondary | ICD-10-CM | POA: Diagnosis not present

## 2017-10-02 DIAGNOSIS — I10 Essential (primary) hypertension: Secondary | ICD-10-CM | POA: Diagnosis not present

## 2017-10-02 DIAGNOSIS — L97218 Non-pressure chronic ulcer of right calf with other specified severity: Secondary | ICD-10-CM | POA: Diagnosis not present

## 2017-10-02 DIAGNOSIS — L97223 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e: Secondary | ICD-10-CM | POA: Diagnosis not present

## 2017-10-02 DIAGNOSIS — E1140 Type 2 diabetes mellitus with diabetic neuropathy, unspecified: Secondary | ICD-10-CM | POA: Diagnosis not present

## 2017-10-03 ENCOUNTER — Encounter: Payer: Self-pay | Admitting: Internal Medicine

## 2017-10-03 NOTE — Progress Notes (Signed)
Alexis, Velez ([id number]) Visit Report for 10/02/2017 Arrival Information Details Patient Name: [name], [name]. Date of Service: 10/02/2017 11:00 AM Medical Record Number: [id number] Patient Account Number: [id number] Date of Birth/Sex: 03/31/1946 (72 y.o. Female) Treating RN: Roger Shelter Primary Care Casidy Alberta: Viviana Simpler Other Clinician: Referring Montae Stager: Viviana Simpler Treating Karon Cotterill/Extender: Melburn Hake, HOYT Weeks in Treatment: 0 Visit Information History Since Last Visit All ordered tests and consults were completed: No Patient Arrived: Ambulatory Added or deleted any medications: No Arrival Time: 11:43 Any new allergies or adverse reactions: No Accompanied By: self Had a fall or experienced change in No Transfer Assistance: None activities of daily living that may affect Patient Identification Verified: Yes risk of falls: Secondary Verification Process Completed: Yes Signs or symptoms of abuse/neglect since last visito No Patient Requires Transmission-Based No Hospitalized since last visit: No Precautions: Pain Present Now: No Patient Has Alerts: Yes Patient Alerts: DM II Electronic Signature(s) Signed: 10/02/2017 4:49:30 PM By: Roger Shelter Entered By: Roger Shelter on 10/02/2017 11:44:14 Crossno, [name] ([id number]) -------------------------------------------------------------------------------- Clinic Level of Care Assessment Details Patient Name: [name]. Date of Service: 10/02/2017 11:00 AM Medical Record Number: [id number] Patient Account Number: [id number] Date of Birth/Sex: March 19, 1946 (72 y.o. Female) Treating RN: Roger Shelter Primary Care Ameya Kutz: Viviana Simpler Other Clinician: Referring Lavarr President: Viviana Simpler Treating Dereke Neumann/Extender: Melburn Hake, HOYT Weeks in Treatment: 0 Clinic Level of Care Assessment Items TOOL 4 Quantity Score []  - Use when only an EandM is performed on FOLLOW-UP visit 0 ASSESSMENTS -  Nursing Assessment / Reassessment []  - Reassessment of Co-morbidities (includes updates in patient status) 0 []  - 0 Reassessment of Adherence to Treatment Plan ASSESSMENTS - Wound and Skin Assessment / Reassessment []  - Simple Wound Assessment / Reassessment - one wound 0 X- 2 5 Complex Wound Assessment / Reassessment - multiple wounds []  - 0 Dermatologic / Skin Assessment (not related to wound area) ASSESSMENTS - Focused Assessment []  - Circumferential Edema Measurements - multi extremities 0 []  - 0 Nutritional Assessment / Counseling / Intervention []  - 0 Lower Extremity Assessment (monofilament, tuning fork, pulses) []  - 0 Peripheral Arterial Disease Assessment (using hand held doppler) ASSESSMENTS - Ostomy and/or Continence Assessment and Care []  - Incontinence Assessment and Management 0 []  - 0 Ostomy Care Assessment and Management (repouching, etc.) PROCESS - Coordination of Care []  - Simple Patient / Family Education for ongoing care 0 X- 1 20 Complex (extensive) Patient / Family Education for ongoing care []  - 0 Staff obtains Programmer, systems, Records, Test Results / Process Orders []  - 0 Staff telephones HHA, Nursing Homes / Clarify orders / etc []  - 0 Routine Transfer to another Facility (non-emergent condition) []  - 0 Routine Hospital Admission (non-emergent condition) []  - 0 New Admissions / Biomedical engineer / Ordering NPWT, Apligraf, etc. []  - 0 Emergency Hospital Admission (emergent condition) []  - 0 Simple Discharge Coordination FAUSTINE, TATES M. ([id number]) X- 1 15 Complex (extensive) Discharge Coordination PROCESS - Special Needs []  - Pediatric / Minor Patient Management 0 []  - 0 Isolation Patient Management []  - 0 Hearing / Language / Visual special needs []  - 0 Assessment of Community assistance (transportation, D/C planning, etc.) []  - 0 Additional assistance / Altered mentation []  - 0 Support Surface(s) Assessment (bed, cushion, seat,  etc.) INTERVENTIONS - Wound Cleansing / Measurement []  - Simple Wound Cleansing - one wound 0 X- 2 5 Complex Wound Cleansing - multiple wounds X- 1 5 Wound Imaging (photographs - any number of wounds) []  - 0  Wound Tracing (instead of photographs) []  - 0 Simple Wound Measurement - one wound X- 2 5 Complex Wound Measurement - multiple wounds INTERVENTIONS - Wound Dressings []  - Small Wound Dressing one or multiple wounds 0 X- 2 15 Medium Wound Dressing one or multiple wounds []  - 0 Large Wound Dressing one or multiple wounds []  - 0 Application of Medications - topical []  - 0 Application of Medications - injection INTERVENTIONS - Miscellaneous []  - External ear exam 0 []  - 0 Specimen Collection (cultures, biopsies, blood, body fluids, etc.) []  - 0 Specimen(s) / Culture(s) sent or taken to Lab for analysis []  - 0 Patient Transfer (multiple staff / Civil Service fast streamer / Similar devices) []  - 0 Simple Staple / Suture removal (25 or less) []  - 0 Complex Staple / Suture removal (26 or more) []  - 0 Hypo / Hyperglycemic Management (close monitor of Blood Glucose) []  - 0 Ankle / Brachial Index (ABI) - do not check if billed separately []  - 0 Vital Signs Grenfell, NATALEE TOMKIEWICZ ([id number]) Has the patient been seen at the hospital within the last three years: Yes Total Score: 100 Level Of Care: New/Established - Level 3 Electronic Signature(s) Signed: 10/02/2017 4:49:30 PM By: Roger Shelter Entered By: Roger Shelter on 10/02/2017 12:04:09 Lennartz, [name] ([id number]) -------------------------------------------------------------------------------- Compression Therapy Details Patient Name: [name]. Date of Service: 10/02/2017 11:00 AM Medical Record Number: [id number] Patient Account Number: [id number] Date of Birth/Sex: [birth year] (72 y.o. Female) Treating RN: Roger Shelter Primary Care Natsuko Kelsay: Viviana Simpler Other Clinician: Referring Galadriel Shroff: Viviana Simpler Treating Osborn Pullin/Extender: Melburn Hake, HOYT Weeks in Treatment: 0 Compression Therapy Performed for Wound Assessment: Wound #2 Left,Lateral Lower Leg Performed By: Clinician Roger Shelter, RN Compression Type: Double Layer Electronic Signature(s) Signed: 10/02/2017 4:49:30 PM By: Roger Shelter Entered By: Roger Shelter on 10/02/2017 12:01:29 [name] ([id number]) -------------------------------------------------------------------------------- Encounter Discharge Information Details Patient Name: [name]. Date of Service: 10/02/2017 11:00 AM Medical Record Number: [id number] Patient Account Number: [id number] Date of Birth/Sex: 1946/04/01 (72 y.o. Female) Treating RN: Roger Shelter Primary Care Markel Kurtenbach: Viviana Simpler Other Clinician: Referring Intisar Claudio: Viviana Simpler Treating Damali Broadfoot/Extender: Melburn Hake, HOYT Weeks in Treatment: 0 Encounter Discharge Information Items Discharge Pain Level: 0 Discharge Condition: Stable Ambulatory Status: Ambulatory Discharge Destination: Home Private Transportation: Auto Accompanied By: self Schedule Follow-up Appointment: Yes Medication Reconciliation completed and No provided to Patient/Care Landi Biscardi: Clinical Summary of Care: Electronic Signature(s) Signed: 10/02/2017 4:49:30 PM By: Roger Shelter Entered By: Roger Shelter on 10/02/2017 12:03:24 [name] ([id number]) -------------------------------------------------------------------------------- Patient/Caregiver Education Details Patient Name: [name]. Date of Service: 10/02/2017 11:00 AM Medical Record Number: [id number] Patient Account Number: [id number] Date of Birth/Gender: 1946/08/28 (72 y.o. Female) Treating RN: Roger Shelter Primary Care Physician: Viviana Simpler Other Clinician: Referring Physician: Viviana Simpler Treating Physician/Extender: Melburn Hake, HOYT Weeks in Treatment: 0 Education Assessment Education  Provided To: Patient Education Topics Provided Wound/Skin Impairment: Handouts: Caring for Your Ulcer Methods: Explain/Verbal Responses: State content correctly Electronic Signature(s) Signed: 10/02/2017 4:49:30 PM By: Roger Shelter Entered By: Roger Shelter on 10/02/2017 12:03:07 [name] ([id number]) -------------------------------------------------------------------------------- Wound Assessment Details Patient Name: [name]. Date of Service: 10/02/2017 11:00 AM Medical Record Number: [id number] Patient Account Number: [id number] Date of Birth/Sex: [birth year] (72 y.o. Female) Treating RN: Roger Shelter Primary Care Larose Batres: Viviana Simpler Other Clinician: Referring Raynie Steinhaus: Viviana Simpler Treating Taeya Theall/Extender: Melburn Hake, HOYT Weeks in Treatment: 0 Wound Status Wound Number: 1 Primary Diabetic Wound/Ulcer of the Lower Etiology: Extremity Wound Location: Right Lower  Leg - Medial Secondary Trauma, Other Wounding Event: Trauma Etiology: Date Acquired: 08/30/2017 Wound Status: Open Weeks Of Treatment: 0 Comorbid Hypertension, Type II Diabetes, Clustered Wound: No History: Osteoarthritis, Neuropathy Photos Photo Uploaded By: Roger Shelter on 10/02/2017 16:55:01 Wound Measurements Length: (cm) 3.5 % Reduct Width: (cm) 3.5 % Reduct Depth: (cm) 0.1 Epitheli Area: (cm) 9.621 Undermi Volume: (cm) 0.962 Star Endin Maxim ion in Area: -16.7% ion in Volume: -16.6% alization: None ning: Yes ting Position (o'clock): 12 g Position (o'clock): 11 um Distance: (cm) 1.8 Wound Description Classification: Grade 1 Wound Margin: Distinct, outline attached Exudate Amount: Large Exudate Type: Sanguinous Exudate Color: red Foul Odor After Cleansing: No Slough/Fibrino Yes Wound Bed Granulation Amount: Medium (34-66%) Exposed Structure Granulation Quality: Red Fascia Exposed: No Necrotic Amount: Medium (34-66%) Fat Layer (Subcutaneous  Tissue) Exposed: Yes Necrotic Quality: Adherent Slough Tendon Exposed: No Muscle Exposed: No Joint Exposed: No Bera, Coti M. ([id number]) Bone Exposed: No Periwound Skin Texture Texture Color No Abnormalities Noted: No No Abnormalities Noted: No Callus: No Atrophie Blanche: No Crepitus: No Cyanosis: No Excoriation: Yes Ecchymosis: No Induration: Yes Erythema: No Rash: No Hemosiderin Staining: No Scarring: No Mottled: No Pallor: No Moisture Rubor: No No Abnormalities Noted: No Dry / Scaly: No Temperature / Pain Maceration: No Temperature: No Abnormality Tenderness on Palpation: Yes Wound Preparation Ulcer Cleansing: Rinsed/Irrigated with Saline Topical Anesthetic Applied: None Treatment Notes Wound #1 (Right, Medial Lower Leg) 1. Cleansed with: Clean wound with Normal Saline Cleanse wound with antibacterial soap and water 3. Peri-wound Care: Moisturizing lotion 4. Dressing Applied: Other dressing (specify in notes) 5. Secondary Dressing Applied ABD Pad 7. Secured with 2 Layer Compression System - Bilateral Notes xtrasorb, abd kerlix and coban wrap with unna boot to anchor Electronic Signature(s) Signed: 10/02/2017 4:49:30 PM By: Roger Shelter Entered By: Roger Shelter on 10/02/2017 11:58:31 Vanderzanden, [name] ([id number]) -------------------------------------------------------------------------------- Wound Assessment Details Patient Name: [name]. Date of Service: 10/02/2017 11:00 AM Medical Record Number: [id number] Patient Account Number: [id number] Date of Birth/Sex: [birth year] (72 y.o. Female) Treating RN: Roger Shelter Primary Care Sande Pickert: Viviana Simpler Other Clinician: Referring Demari Kropp: Viviana Simpler Treating Skyah Hannon/Extender: Melburn Hake, HOYT Weeks in Treatment: 0 Wound Status Wound Number: 2 Primary Diabetic Wound/Ulcer of the Lower Etiology: Extremity Wound Location: Left Lower Leg - Lateral Secondary Trauma,  Other Wounding Event: Trauma Etiology: Date Acquired: 08/30/2017 Wound Status: Open Weeks Of Treatment: 0 Comorbid Hypertension, Type II Diabetes, Clustered Wound: No History: Osteoarthritis, Neuropathy Photos Photo Uploaded By: Roger Shelter on 10/02/2017 16:55:25 Wound Measurements Length: (cm) 7.1 % Reduct Width: (cm) 4.5 % Reduct Depth: (cm) 0.9 Epitheli Area: (cm) 25.093 Undermi Volume: (cm) 22.584 Star Endin Maxim ion in Area: -26.8% ion in Volume: -63% alization: None ning: Yes ting Position (o'clock): 5 g Position (o'clock): 8 um Distance: (cm) 4.2 Wound Description Classification: Grade 2 Wound Margin: Distinct, outline attached Exudate Amount: Large Exudate Type: Serosanguineous Exudate Color: red, brown Foul Odor After Cleansing: No Slough/Fibrino Yes Wound Bed Granulation Amount: Small (1-33%) Exposed Structure Granulation Quality: Red, Hyper-granulation Fascia Exposed: No Necrotic Amount: Large (67-100%) Fat Layer (Subcutaneous Tissue) Exposed: Yes Necrotic Quality: Eschar, Adherent Slough Tendon Exposed: No Muscle Exposed: Yes Necrosis of Muscle: No Medico, Deidrea M. ([id number]) Joint Exposed: No Bone Exposed: No Periwound Skin Texture Texture Color No Abnormalities Noted: No No Abnormalities Noted: No Callus: No Atrophie Blanche: No Crepitus: No Cyanosis: No Excoriation: Yes Ecchymosis: No Induration: Yes Erythema: Yes Rash: No Erythema Location: Circumferential Scarring: Yes Hemosiderin Staining: No Mottled: No  Moisture Pallor: No No Abnormalities Noted: No Rubor: No Dry / Scaly: No Maceration: No Temperature / Pain Temperature: No Abnormality Tenderness on Palpation: Yes Wound Preparation Ulcer Cleansing: Rinsed/Irrigated with Saline Topical Anesthetic Applied: Other: lidocaine 4%, Treatment Notes Wound #2 (Left, Lateral Lower Leg) 1. Cleansed with: Clean wound with Normal Saline Cleanse wound with antibacterial  soap and water 3. Peri-wound Care: Moisturizing lotion 4. Dressing Applied: Other dressing (specify in notes) 5. Secondary Dressing Applied ABD Pad 7. Secured with 2 Layer Compression System - Bilateral Notes xtrasorb, abd kerlix and coban wrap with unna boot to anchor Electronic Signature(s) Signed: 10/02/2017 4:49:30 PM By: Roger Shelter Entered By: Roger Shelter on 10/02/2017 12:00:35

## 2017-10-05 NOTE — Telephone Encounter (Signed)
Pt is asking for a permanent Handicap Placard. If you approve, I will prepare the form.

## 2017-10-05 NOTE — Telephone Encounter (Signed)
Please get one ready for me

## 2017-10-07 ENCOUNTER — Other Ambulatory Visit
Admission: RE | Admit: 2017-10-07 | Discharge: 2017-10-07 | Disposition: A | Discharge status: Home / Self Care | Payer: Medicare Other | Source: Ambulatory Visit | Attending: Internal Medicine | Admitting: Internal Medicine

## 2017-10-07 ENCOUNTER — Encounter: Payer: Medicare Other | Admitting: Internal Medicine

## 2017-10-07 DIAGNOSIS — I10 Essential (primary) hypertension: Secondary | ICD-10-CM | POA: Diagnosis not present

## 2017-10-07 DIAGNOSIS — L089 Local infection of the skin and subcutaneous tissue, unspecified: Secondary | ICD-10-CM | POA: Insufficient documentation

## 2017-10-07 DIAGNOSIS — E11622 Type 2 diabetes mellitus with other skin ulcer: Secondary | ICD-10-CM | POA: Diagnosis not present

## 2017-10-07 DIAGNOSIS — S81802A Unspecified open wound, left lower leg, initial encounter: Secondary | ICD-10-CM | POA: Insufficient documentation

## 2017-10-07 DIAGNOSIS — E1140 Type 2 diabetes mellitus with diabetic neuropathy, unspecified: Secondary | ICD-10-CM | POA: Diagnosis not present

## 2017-10-07 DIAGNOSIS — L97212 Non-pressure chronic ulcer of right calf with fat layer exposed: Secondary | ICD-10-CM | POA: Diagnosis not present

## 2017-10-07 DIAGNOSIS — M1990 Unspecified osteoarthritis, unspecified site: Secondary | ICD-10-CM | POA: Diagnosis not present

## 2017-10-07 DIAGNOSIS — Z794 Long term (current) use of insulin: Secondary | ICD-10-CM | POA: Diagnosis not present

## 2017-10-07 DIAGNOSIS — L97218 Non-pressure chronic ulcer of right calf with other specified severity: Secondary | ICD-10-CM | POA: Diagnosis not present

## 2017-10-07 DIAGNOSIS — L97225 Non-pressure chronic ulcer of left calf with muscle involvement without evidence of necrosis: Secondary | ICD-10-CM | POA: Diagnosis not present

## 2017-10-07 DIAGNOSIS — L97223 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e: Secondary | ICD-10-CM | POA: Diagnosis not present

## 2017-10-09 ENCOUNTER — Encounter: Payer: Self-pay | Admitting: Internal Medicine

## 2017-10-11 LAB — AEROBIC CULTURE W GRAM STAIN (SUPERFICIAL SPECIMEN)

## 2017-10-11 LAB — AEROBIC CULTURE  (SUPERFICIAL SPECIMEN)

## 2017-10-11 NOTE — Progress Notes (Addendum)
[name], [name] ([id number]) Visit Report for 10/07/2017 Debridement Details Patient Name: [name], [name]. Date of Service: 10/07/2017 1:30 PM Medical Record Number: [id number] Patient Account Number: [id number] Date of Birth/Sex: 04-20-1946 (71 y.o. Female) Treating RN: Carolyne Fiscal, Debi Primary Care Provider: Viviana Simpler Other Clinician: Referring Provider: Viviana Simpler Treating Provider/Extender: Tito Dine in Treatment: 1 Debridement Performed for Wound #1 Right,Medial Lower Leg Assessment: Performed By: Physician Ricard Dillon, MD Debridement: Debridement Severity of Tissue Pre Fat layer exposed Debridement: Pre-procedure Verification/Time Yes - 14:14 Out Taken: Start Time: 14:20 Pain Control: Lidocaine 4% Topical Solution Level: Skin/Subcutaneous Tissue Total Area Debrided (L x W): 1.2 (cm) x 2.1 (cm) = 2.52 (cm) Tissue and other material Viable, Non-Viable, Exudate, Fibrin/Slough, Subcutaneous debrided: Instrument: Curette Bleeding: Minimum Hemostasis Achieved: Pressure End Time: 14:21 Procedural Pain: 0 Post Procedural Pain: 0 Response to Treatment: Procedure was tolerated well Post Debridement Measurements of Total Wound Length: (cm) 1.5 Width: (cm) 2.4 Depth: (cm) 0.8 Volume: (cm) 2.262 Character of Wound/Ulcer Post Debridement: Requires Further Debridement Severity of Tissue Post Debridement: Fat layer exposed Post Procedure Diagnosis Same as Pre-procedure Electronic Signature(s) Signed: 10/07/2017 4:49:25 PM By: Linton Ham MD Signed: 10/09/2017 4:17:47 PM By: Alric Quan Entered By: Linton Ham on 10/07/2017 16:03:00 Sporer, [name] ([id number]) -------------------------------------------------------------------------------- Debridement Details Patient Name: [name]. Date of Service: 10/07/2017 1:30 PM Medical Record Number: [id number] Patient Account Number: [id number] Date of Birth/Sex: 28-Apr-1946 (71  y.o. Female) Treating RN: Carolyne Fiscal, Debi Primary Care Provider: Viviana Simpler Other Clinician: Referring Provider: Viviana Simpler Treating Provider/Extender: Tito Dine in Treatment: 1 Debridement Performed for Wound #2 Left,Lateral Lower Leg Assessment: Performed By: Physician Ricard Dillon, MD Debridement: Debridement Severity of Tissue Pre Fat layer exposed Debridement: Pre-procedure Verification/Time Yes - 14:14 Out Taken: Start Time: 14:15 Pain Control: Lidocaine 4% Topical Solution Level: Skin/Subcutaneous Tissue/Muscle Total Area Debrided (L x W): 8.4 (cm) x 4.5 (cm) = 37.8 (cm) Tissue and other material Viable, Non-Viable, Exudate, Fibrin/Slough, Muscle, Subcutaneous debrided: Instrument: Curette Specimen: Swab Number of Specimens Taken: 1 Bleeding: Minimum Hemostasis Achieved: Pressure End Time: 14:17 Procedural Pain: 0 Post Procedural Pain: 0 Response to Treatment: Procedure was tolerated well Post Debridement Measurements of Total Wound Length: (cm) 8.4 Width: (cm) 4.5 Depth: (cm) 0.8 Volume: (cm) 23.75 Character of Wound/Ulcer Post Debridement: Requires Further Debridement Severity of Tissue Post Debridement: Fat layer exposed Post Procedure Diagnosis Same as Pre-procedure Electronic Signature(s) Signed: 10/07/2017 4:49:25 PM By: Linton Ham MD Signed: 10/09/2017 4:17:47 PM By: Alric Quan Entered By: Linton Ham on 10/07/2017 16:04:17 [name], [name] ([id number]) -------------------------------------------------------------------------------- HPI Details Patient Name: [name]. Date of Service: 10/07/2017 1:30 PM Medical Record Number: [id number] Patient Account Number: [id number] Date of Birth/Sex: [birth year] (71 y.o. Female) Treating RN: Carolyne Fiscal, Debi Primary Care Provider: Viviana Simpler Other Clinician: Referring Provider: Viviana Simpler Treating Provider/Extender: Tito Dine in  Treatment: 1 History of Present Illness HPI Description: 09/30/17; patient is a 72 year old woman with type 2 diabetes and peripheral neuropathy. On 08/30/17 she fell down some steps. She developed redness and swelling and was seen in her primary physician's office on 09/11/17. She was felt to have cellulitis and given Keflex. She will return for follow-up on 09/18/17 noted to have an 8 x 4 cm contusion on the left lateral lower leg and a 3 x 3 cm contusion on the right medial lower leg. At follow-up on 09/29/16 she had a 4 x 4 centimeter necrotic surface and she's been referred  here. The patient is not on blood thinners she does not have a history of falling. She states her diabetes is under good control ABIs in this clinic could not be obtained because of pain. Patient does not have a prior wound history 10/07/17; the patient came back to the clinic last week for a nurse rewrap. Noted that the area on the right medial leg had opened as well. She is using silver alginate under compression. The substantial wound on the left anterior leg is malodorous today. I did a culture of the wound surface but no empiric antibiotics. The area on the right lateral leg is open with a small draining open area with gelatinous old blood. Using pickups and scalpel I remove necrotic surface from around this area over the entire area, pulse fairly substantially inferiorly. We've been using silver alginate to both wound areas Electronic Signature(s) Signed: 10/07/2017 4:49:25 PM By: Linton Ham MD Entered By: Linton Ham on 10/07/2017 16:07:52 [name], [name] ([id number]) -------------------------------------------------------------------------------- Physical Exam Details Patient Name: [name]. Date of Service: 10/07/2017 1:30 PM Medical Record Number: [id number] Patient Account Number: [id number] Date of Birth/Sex: 06/02/1946 (71 y.o. Female) Treating RN: Carolyne Fiscal, Debi Primary Care Provider: Viviana Simpler Other Clinician: Referring Provider: Viviana Simpler Treating Provider/Extender: Tito Dine in Treatment: 1 Constitutional Patient is hypertensive.. Pulse regular and within target range for patient.Marland Kitchen Respirations regular, non-labored and within target range.. Temperature is normal and within the target range for the patient.Marland Kitchen appears in no distress. Notes Wound exam oThe more substantial areas on the lateral left leg. Still requiring extensive debridement of necrotic fat muscle and subcutaneous tissue. There is a single layer of skin separating 2 open areas which connect. This area of skin is likely to be in need of removal in the next week. This area was malodorous I have cultured and given empiric doxycycline. There was no evidence of surrounding soft tissue infection oThe hematoma that was intact last week had opened by Friday. Small open area with necrotic surface around this. Using pickups and scalpel the area is removed. The base of the hematoma looks satisfactory although there is still tunneling and undermining inferiorly Electronic Signature(s) Signed: 10/07/2017 4:49:25 PM By: Linton Ham MD Entered By: Linton Ham on 10/07/2017 16:10:12 [name], [name] ([id number]) -------------------------------------------------------------------------------- Physician Orders Details Patient Name: [name]. Date of Service: 10/07/2017 1:30 PM Medical Record Number: [id number] Patient Account Number: [id number] Date of Birth/Sex: February 26, 1946 (71 y.o. Female) Treating RN: Carolyne Fiscal, Debi Primary Care Provider: Viviana Simpler Other Clinician: Referring Provider: Viviana Simpler Treating Provider/Extender: Tito Dine in Treatment: 1 Verbal / Phone Orders: Yes Clinician: Pinkerton, Debi Read Back and Verified: Yes Diagnosis Coding Wound Cleansing Wound #1 Right,Medial Lower Leg o Clean wound with Normal Saline. o Cleanse wound with mild  soap and water Wound #2 Left,Lateral Lower Leg o Clean wound with Normal Saline. o Cleanse wound with mild soap and water Anesthetic (add to Medication List) Wound #1 Right,Medial Lower Leg o Topical Lidocaine 4% cream applied to wound bed prior to debridement (In Clinic Only). Wound #2 Left,Lateral Lower Leg o Topical Lidocaine 4% cream applied to wound bed prior to debridement (In Clinic Only). Primary Wound Dressing Wound #1 Right,Medial Lower Leg o Silvercel Non-Adherent Wound #2 Left,Lateral Lower Leg o Silvercel Non-Adherent Secondary Dressing Wound #1 Right,Medial Lower Leg o ABD pad o Dry Gauze o XtraSorb o Other - charcoal Wound #2 Left,Lateral Lower Leg o ABD pad o Dry Gauze o XtraSorb o  Other - charcoal Dressing Change Frequency Wound #1 Right,Medial Lower Leg o Other: - twice a week Wound #2 Left,Lateral Lower Leg o Other: - twice a week [name], BLACKARD. ([id number]) Follow-up Appointments Wound #1 Right,Medial Lower Leg o Return Appointment in 1 week. o Nurse Visit as needed - 10/12/17 Wound #2 Left,Lateral Lower Leg o Return Appointment in 1 week. o Nurse Visit as needed - 10/12/17 Edema Control Wound #1 Right,Medial Lower Leg o Kerlix and Coban - Bilateral - unna to anchor o Elevate legs to the level of the heart and pump ankles as often as possible Wound #2 Left,Lateral Lower Leg o Kerlix and Coban - Bilateral - unna to anchor o Elevate legs to the level of the heart and pump ankles as often as possible Additional Orders / Instructions Wound #1 Right,Medial Lower Leg o Increase protein intake. o Other: - Vitamin C, Zinc Wound #2 Left,Lateral Lower Leg o Increase protein intake. o Other: - Vitamin C, Zinc Laboratory o Bacteria identified in Wound by Culture (MICRO) - left lateral lower leg oooo LOINC Code: 1017-5 oooo Convenience Name: Wound culture routine Patient Medications Allergies:  NKDA Notifications Medication Indication Start End doxycycline monohydrate wound infection 10/07/2017 DOSE oral 100 mg capsule - 1 capsule oral bid for 7 days lidocaine DOSE 1 - topical 4 % cream - 1 cream topical Cipro 10/12/2017 DOSE 1 - oral 500 mg tablet - 1 tablet oral 2 times a day for 10 days Electronic Signature(s) Signed: 10/12/2017 12:49:06 PM By: Worthy Keeler PA-C Signed: 10/14/2017 2:37:44 PM By: Linton Ham MD Previous Signature: 10/07/2017 4:49:25 PM Version By: Linton Ham MD Previous Signature: 10/09/2017 4:17:47 PM Version By: Alric Quan Previous Signature: 10/07/2017 2:25:37 PM Version By: Linton Ham MD Entered By: Worthy Keeler on 10/12/2017 12:49:05 [name], [name] ([id number]) [name], FOULK. ([id number]) -------------------------------------------------------------------------------- Prescription 10/07/2017 Patient Name: [name]. Provider: Ricard Dillon MD Date of Birth: 11-18-1945 NPI#: [id number] Sex: F DEA#: [id number] Phone #: [phone] License #: [id number] Patient Address: [address], [place] [place], [place] [address] [place], [place] [address] [phone] Allergies NKDA Medication Medication: Route: Strength: Form: lidocaine 4 % topical cream topical 4% cream Class: TOPICAL LOCAL ANESTHETICS Dose: Frequency / Time: Indication: 1 1 cream topical Number of Refills: Number of Units: 0 Generic Substitution: Start Date: End Date: One Time Use: Substitution Permitted No Note to Pharmacy: Signature(s): Date(s): Electronic Signature(s) Signed: 10/12/2017 4:49:26 PM By: Worthy Keeler PA-C Signed: 10/14/2017 2:37:44 PM By: Linton Ham MD Previous Signature: 10/07/2017 4:49:25 PM Version By: Linton Ham MD Previous Signature: 10/09/2017 4:17:47 PM Version By: Alric Quan Entered By: Worthy Keeler on  10/12/2017 12:49:06 Remley, [name] ([id number]) Romana Juniper, Noe M. ([id number]) --------------------------------------------------------------------------------  Problem List Details Patient Name: [name]. Date of Service: 10/07/2017 1:30 PM Medical Record Number: [id number] Patient Account Number: [id number] Date of Birth/Sex: May 05, 1946 (71 y.o. 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 09/30/2017 Yes L97.218 Non-pressure chronic ulcer of right calf with other specified severity 09/30/2017 Yes E11.40 Type 2 diabetes mellitus with diabetic neuropathy, unspecified 09/30/2017 Yes I87.331 Chronic venous hypertension (idiopathic) with ulcer and 10/07/2017 Yes inflammation of right lower extremity Inactive Problems  Resolved Problems Electronic Signature(s) Signed: 10/07/2017 4:49:25 PM By: Linton Ham MD Entered By: Linton Ham on 10/07/2017 15:59:26 [name], [name] ([id number]) -------------------------------------------------------------------------------- Progress Note Details Patient Name: [name]. Date of Service: 10/07/2017 1:30 PM Medical Record Number: [id number] Patient Account Number: [id number] Date of Birth/Sex: 1946-01-10 (71 y.o. Female) Treating RN: Carolyne Fiscal, Debi Primary Care Provider: Viviana Simpler Other Clinician: Referring Provider: Viviana Simpler Treating Provider/Extender: Tito Dine in Treatment: 1 Subjective History of Present Illness (HPI) 09/30/17; patient is a 72 year old woman with type 2 diabetes and peripheral neuropathy. On 08/30/17 she fell down some steps. She developed redness and swelling and was seen in her primary  physician's office on 09/11/17. She was felt to have cellulitis and given Keflex. She will return for follow-up on 09/18/17 noted to have an 8 x 4 cm contusion on the left lateral lower leg and a 3 x 3 cm contusion on the right medial lower leg. At follow-up on 09/29/16 she had a 4 x 4 centimeter necrotic surface and she's been referred here. The patient is not on blood thinners she does not have a history of falling. She states her diabetes is under good control ABIs in this clinic could not be obtained because of pain. Patient does not have a prior wound history 10/07/17; the patient came back to the clinic last week for a nurse rewrap. Noted that the area on the right medial leg had opened as well. She is using silver alginate under compression. The substantial wound on the left anterior leg is malodorous today. I did a culture of the wound surface but no empiric antibiotics. The area on the right lateral leg is open with a small draining open area with gelatinous old blood. Using pickups and scalpel I remove necrotic surface from around this area over the entire area, pulse fairly substantially inferiorly. We've been using silver alginate to both wound areas Objective Constitutional Patient is hypertensive.. Pulse regular and within target range for patient.Marland Kitchen Respirations regular, non-labored and within target range.. Temperature is normal and within the target range for the patient.Marland Kitchen appears in no distress. Vitals Time Taken: 1:45 PM, Height: 61 in, Weight: 179.1 lbs, BMI: 33.8, Temperature: 97.6 F, Pulse: 84 bpm, Respiratory Rate: 18 breaths/min, Blood Pressure: 154/85 mmHg. General Notes: Wound exam The more substantial areas on the lateral left leg. Still requiring extensive debridement of necrotic fat muscle and subcutaneous tissue. There is a single layer of skin separating 2 open areas which connect. This area of skin is likely to be in need of removal in the next week. This area was  malodorous I have cultured and given empiric doxycycline. There was no evidence of surrounding soft tissue infection The hematoma that was intact last week had opened by Friday. Small open area with necrotic surface around this. Using pickups and scalpel the area is removed. The base of the hematoma looks satisfactory although there is still tunneling and undermining inferiorly Integumentary (Hair, Skin) Wound #1 status is Open. Original cause of wound was Trauma. The wound is located on the Right,Medial Lower Leg. The wound measures 1.5cm length x 2.1cm width x 0.5cm depth; 2.474cm^2 area and 1.237cm^3 volume. There is Fat Layer (Subcutaneous Tissue) Exposed exposed. There is no tunneling noted, however, there is undermining starting at 12:00 and ending at 12:00 with a maximum distance of 1.1cm. There is a large amount of sanguinous drainage noted. The wound [name], [name]. ([id number]) margin is distinct with the outline attached to the wound  base. There is medium (34-66%) red granulation within the wound bed. There is a medium (34-66%) amount of necrotic tissue within the wound bed including Adherent Slough. The periwound skin appearance exhibited: Excoriation, Induration, Scarring, Maceration, Erythema. The periwound skin appearance did not exhibit: Callus, Crepitus, Rash, Dry/Scaly, Atrophie Blanche, Cyanosis, Ecchymosis, Hemosiderin Staining, Mottled, Pallor, Rubor. The surrounding wound skin color is noted with erythema which is circumferential. Periwound temperature was noted as No Abnormality. The periwound has tenderness on palpation. Wound #2 status is Open. Original cause of wound was Trauma. The wound is located on the Left,Lateral Lower Leg. The wound measures 8.4cm length x 4.5cm width x 0.5cm depth; 29.688cm^2 area and 14.844cm^3 volume. There is muscle and Fat Layer (Subcutaneous Tissue) Exposed exposed. There is no undermining noted, however, there is tunneling at 6:00 with a  maximum distance of 3.9cm. There is a large amount of purulent drainage noted. Foul odor after cleansing was noted. The wound margin is distinct with the outline attached to the wound base. There is medium (34-66%) red, hyper - granulation within the wound bed. There is a medium (34-66%) amount of necrotic tissue within the wound bed including Eschar and Adherent Slough. The periwound skin appearance exhibited: Excoriation, Induration, Scarring, Maceration, Erythema. The periwound skin appearance did not exhibit: Callus, Crepitus, Rash, Dry/Scaly, Atrophie Blanche, Cyanosis, Ecchymosis, Hemosiderin Staining, Mottled, Pallor, Rubor. The surrounding wound skin color is noted with erythema which is circumferential. Periwound temperature was noted as No Abnormality. The periwound has tenderness on palpation. 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 There was a Skin/Subcutaneous Tissue Debridement (11042- 11047) debridement with total area of 2.52 sq cm performed by Ricard Dillon, MD. with the following instrument(s): Curette to remove Viable and Non-Viable tissue/material including Exudate, Fibrin/Slough, and Subcutaneous after achieving pain control using Lidocaine 4% Topical Solution. A time out was conducted at 14:14, prior to the start of the procedure. A Minimum amount of bleeding was controlled with Pressure. The procedure was tolerated well with a pain level of 0  throughout and a pain level of 0 following the procedure. Post Debridement Measurements: 1.5cm length x 2.4cm width x 0.8cm depth; 2.262cm^3 volume. Character of Wound/Ulcer Post Debridement requires further debridement. Severity of Tissue Post Debridement is: Fat layer exposed. Post procedure Diagnosis Wound #1: Same as Pre-Procedure Wound #2 Pre-procedure diagnosis of Wound #2 is a Diabetic Wound/Ulcer of the Lower Extremity located on the Left,Lateral Lower Leg .Severity of Tissue Pre Debridement is: Fat layer exposed. There was a Skin/Subcutaneous Tissue/Muscle Debridement (33295-18841) debridement with total area of 37.8 sq cm performed by Ricard Dillon, MD. with the following instrument(s): Curette to remove Viable and Non-Viable tissue/material including Exudate, Fibrin/Slough, Muscle, and Subcutaneous after achieving pain control using Lidocaine 4% Topical Solution. 1 Specimen was taken by a Swab and sent [name], [name]. ([id number]) to the lab per facility protocol.A time out was conducted at 14:14, prior to the start of the procedure. A Minimum amount of bleeding was controlled with Pressure. The procedure was tolerated well with a pain level of  0 throughout and a pain level of 0 following the procedure. Post Debridement Measurements: 8.4cm length x 4.5cm width x 0.8cm depth; 23.75cm^3 volume. Character of Wound/Ulcer Post Debridement requires further debridement. Severity of Tissue Post Debridement is: Fat layer exposed. Post procedure Diagnosis Wound #2: Same as Pre-Procedure Plan Wound Cleansing: Wound #1 Right,Medial Lower Leg: Clean wound with Normal Saline. Cleanse wound with mild soap and water Wound #2 Left,Lateral Lower Leg: Clean wound with Normal Saline. Cleanse wound with mild soap and water Anesthetic (add to Medication List): Wound #1 Right,Medial Lower Leg: Topical Lidocaine 4% cream applied to wound bed prior to debridement (In Clinic Only). Wound #2  Left,Lateral Lower Leg: Topical Lidocaine 4% cream applied to wound bed prior to debridement (In Clinic Only). Primary Wound Dressing: Wound #1 Right,Medial Lower Leg: Silvercel Non-Adherent Wound #2 Left,Lateral Lower Leg: Silvercel Non-Adherent Secondary Dressing: Wound #1 Right,Medial Lower Leg: ABD pad Dry Gauze XtraSorb Other - charcoal Wound #2 Left,Lateral Lower Leg: ABD pad Dry Gauze XtraSorb Other - charcoal Dressing Change Frequency: Wound #1 Right,Medial Lower Leg: Other: - twice a week Wound #2 Left,Lateral Lower Leg: Other: - twice a week Follow-up Appointments: Wound #1 Right,Medial Lower Leg: Return Appointment in 1 week. Nurse Visit as needed - 10/12/17 Wound #2 Left,Lateral Lower Leg: Return Appointment in 1 week. Nurse Visit as needed - 10/12/17 Edema Control: Wound #1 Right,Medial Lower Leg: Kerlix and Coban - Bilateral - unna to anchor Elevate legs to the level of the heart and pump ankles as often as possible Wound #2 Left,Lateral Lower Leg: ERNEST, ORR. ([id number]) Kerlix and Coban - Bilateral - unna to anchor Elevate legs to the level of the heart and pump ankles as often as possible Additional Orders / Instructions: Wound #1 Right,Medial Lower Leg: Increase protein intake. Other: - Vitamin C, Zinc Wound #2 Left,Lateral Lower Leg: Increase protein intake. Other: - Vitamin C, Zinc Laboratory ordered were: Wound culture routine - left lateral lower leg The following medication(s) was prescribed: doxycycline monohydrate oral 100 mg capsule 1 capsule oral bid for 7 days for wound infection starting 10/07/2017 lidocaine topical 4 % cream 1 1 cream topical was prescribed at facility Cipro oral 500 mg tablet 1 1 tablet oral 2 times a day for 10 days starting 10/12/2017 o #1 we are going to continue with silver alginate both wound areas #2. Doxycycline 100 twice a day for 7 days while we await culture #3 it is likely that she is going to require  removal of the rim of skin on the left and more substantial debridement on the right #4 by plan here is ultimately when we can get the surface of this healthy and free of infection that she'll probably need an advanced treatment options such as Apligraf Electronic Signature(s) Signed: 10/27/2017 8:58:57 AM By: Linton Ham MD Signed: 10/27/2017 10:42:09 AM By: Worthy Keeler PA-C Previous Signature: 10/07/2017 4:49:25 PM Version By: Linton Ham MD Entered By: Worthy Keeler on 10/27/2017 08:20:06 [name] ([id number]) -------------------------------------------------------------------------------- SuperBill Details Patient Name: [name]. Date of Service: 10/07/2017 Medical Record Number: [id number] Patient Account Number: [id number] Date of Birth/Sex: 26-Apr-1946 (72 y.o. Female) Treating RN: Carolyne Fiscal, Debi Primary Care Provider: Viviana Simpler Other Clinician: Referring Provider: Viviana Simpler Treating Provider/Extender: Tito Dine in Treatment: 1 Diagnosis Coding ICD-10 Codes Code Description [phone] 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e L97.218 Non-pressure chronic ulcer of right calf with other specified severity E11.40 Type 2 diabetes mellitus with  diabetic neuropathy, unspecified I87.331 Chronic venous hypertension (idiopathic) with ulcer and inflammation of right lower extremity L97.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 right calf with necrosis of muscle Facility Procedures CPT4 Code: 60737106 Description: 26948 - DEB SUBQ TISSUE 20 SQ CM/< ICD-10 Diagnosis Description L97.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 right calf with necrosis of mu Modifier: scle Quantity: 1 CPT4 Code: 54627035 Description: 00938 - DEB MUSC/FASCIA 20 SQ CM/< ICD-10 Diagnosis Description 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 Modifier: cle Quantity: 1 CPT4 Code: 18299371 Description: 11046 - DEB MUSC/FASCIA EA ADDL  20 CM ICD-10 Diagnosis Description 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 Modifier: cle Quantity: 1 Physician Procedures CPT4 Code: 6967893 Description: 81017 - WC PHYS SUBQ TISS 20 SQ CM ICD-10 Diagnosis Description P10.258 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 ([id number]) Modifier: le Quantity: 1 Electronic Signature(s) Signed: 10/07/2017 4:49:25 PM By: Linton Ham MD Entered By: Linton Ham on 10/07/2017 16:13:43

## 2017-10-11 NOTE — Progress Notes (Signed)
Alexis, BLANE ([id number]) Visit Report for 10/07/2017 Arrival Information Details Patient Name: [name], [name]. Date of Service: 10/07/2017 1:30 PM Medical Record Number: [id number] Patient Account Number: [id number] Date of Birth/Sex: 1946-03-09 (71 y.o. Female) Treating RN: Ahmed Prima Primary Care Anjana Cheek: Viviana Simpler Other Clinician: Referring Kanda Deluna: Viviana Simpler Treating Willamae Demby/Extender: Tito Dine in Treatment: 1 Visit Information History Since Last Visit All ordered tests and consults were completed: No Patient Arrived: Ambulatory Added or deleted any medications: No Arrival Time: 13:43 Any new allergies or adverse reactions: No Accompanied By: self Had a fall or experienced change in No Transfer Assistance: None activities of daily living that may affect Patient Identification Verified: Yes risk of falls: Secondary Verification Process Completed: Yes Signs or symptoms of abuse/neglect since last visito No Patient Requires Transmission-Based No Hospitalized since last visit: No Precautions: Has Dressing in Place as Prescribed: Yes Patient Has Alerts: Yes Has Compression in Place as Prescribed: Yes Patient Alerts: DM II Pain Present Now: No Electronic Signature(s) Signed: 10/09/2017 4:17:47 PM By: Alric Quan Entered By: Alric Quan on 10/07/2017 13:44:03 Cancio, [name] ([id number]) -------------------------------------------------------------------------------- Encounter Discharge Information Details Patient Name: [name]. Date of Service: 10/07/2017 1:30 PM Medical Record Number: [id number] Patient Account Number: [id number] Date of Birth/Sex: 07/11/1946 (71 y.o. Female) Treating RN: Carolyne Fiscal, Debi Primary Care Aidon Klemens: Viviana Simpler Other Clinician: Referring Brylynn Hanssen: Viviana Simpler Treating Avi Kerschner/Extender: Tito Dine in Treatment: 1 Encounter Discharge Information Items Discharge Pain  Level: 0 Discharge Condition: Stable Ambulatory Status: Ambulatory Discharge Destination: Home Transportation: Other Accompanied By: self Schedule Follow-up Appointment: Yes Medication Reconciliation completed and No provided to Patient/Care Benna Arno: Provided on Clinical Summary of Care: 10/07/2017 Form Type Recipient Paper Patient LK Electronic Signature(s) Signed: 10/09/2017 4:27:15 PM By: Ruthine Dose Previous Signature: 10/07/2017 2:07:09 PM Version By: Alric Quan Entered By: Ruthine Dose on 10/07/2017 14:47:35 Shawhan, [name] ([id number]) -------------------------------------------------------------------------------- Lower Extremity Assessment Details Patient Name: [name]. Date of Service: 10/07/2017 1:30 PM Medical Record Number: [id number] Patient Account Number: [id number] Date of Birth/Sex: 1946/06/17 (71 y.o. Female) Treating RN: Carolyne Fiscal, Debi Primary Care Sheriden Archibeque: Viviana Simpler Other Clinician: Referring Ziyad Dyar: Viviana Simpler Treating Kemal Amores/Extender: Tito Dine in Treatment: 1 Edema Assessment Assessed: [Left: No] [Right: No] [Left: Edema] [Right: :] Calf Left: Right: Point of Measurement: 34 cm From Medial Instep 37.8 cm 36.7 cm Ankle Left: Right: Point of Measurement: 9 cm From Medial Instep 24.6 cm 25.7 cm Vascular Assessment Pulses: Dorsalis Pedis Palpable: [Left:Yes] [Right:Yes] Posterior Tibial Extremity colors, hair growth, and conditions: Extremity Color: [Left:Red] [Right:Red] Temperature of Extremity: [Left:Warm] [Right:Warm] Capillary Refill: [Left:< 3 seconds] [Right:< 3 seconds] Toe Nail Assessment Left: Right: Thick: Yes Yes Discolored: No No Deformed: No No Improper Length and Hygiene: Yes Yes Electronic Signature(s) Signed: 10/09/2017 4:17:47 PM By: Alric Quan Entered By: Alric Quan on 10/07/2017 14:01:33 Stanard, [name]  ([id number]) -------------------------------------------------------------------------------- Multi Wound Chart Details Patient Name: [name]. Date of Service: 10/07/2017 1:30 PM Medical Record Number: [id number] Patient Account Number: [id number] Date of Birth/Sex: August 10, 1946 (71 y.o. Female) Treating RN: Carolyne Fiscal, Debi Primary Care Densil Ottey: Viviana Simpler Other Clinician: Referring Bernis Schreur: Viviana Simpler Treating Severo Beber/Extender: Tito Dine in Treatment: 1 Vital Signs Height(in): 61 Pulse(bpm): 84 Weight(lbs): 179.1 Blood Pressure(mmHg): 154/85 Body Mass Index(BMI): 34 Temperature(F): 97.6 Respiratory Rate 18 (breaths/min): Photos: [1:No Photos] [2:No Photos] [N/A:N/A] Wound Location: [1:Right Lower Leg - Medial] [2:Left Lower Leg - Lateral] [N/A:N/A] Wounding Event: [1:Trauma] [2:Trauma] [N/A:N/A] Primary Etiology: [1:Diabetic Wound/Ulcer of  the Lower Extremity] [2:Diabetic Wound/Ulcer of the Lower Extremity] [N/A:N/A] Secondary Etiology: [1:Trauma, Other] [2:Trauma, Other] [N/A:N/A] Comorbid History: [1:Hypertension, Type II Diabetes, Osteoarthritis, Neuropathy] [2:Hypertension, Type II Diabetes, Osteoarthritis, Neuropathy] [N/A:N/A] Date Acquired: [1:08/30/2017] [2:08/30/2017] [N/A:N/A] Weeks of Treatment: [1:1] [2:1] [N/A:N/A] Wound Status: [1:Open] [2:Open] [N/A:N/A] Measurements L x W x D [1:1.5x2.1x0.5] [2:8.4x4.5x0.5] [N/A:N/A] (cm) Area (cm) : [1:2.474] [1:61.096] [N/A:N/A] Volume (cm) : [1:1.237] [2:14.844] [N/A:N/A] % Reduction in Area: [1:70.00%] [2:-50.00%] [N/A:N/A] % Reduction in Volume: [1:-49.90%] [2:-7.10%] [N/A:N/A] Position 1 (o'clock): [2:6] Maximum Distance 1 (cm): [2:3.9] Starting Position 1 [1:12] (o'clock): Ending Position 1 [1:12] (o'clock): Maximum Distance 1 (cm): [1:1.1] Tunneling: [1:No] [2:Yes] [N/A:N/A] Undermining: [1:Yes] [2:No] [N/A:N/A] Classification: [1:Grade 2] [2:Grade 2] [N/A:N/A] Exudate Amount:  [1:Large] [2:Large] [N/A:N/A] Exudate Type: [1:Sanguinous] [2:Purulent] [N/A:N/A] Exudate Color: [1:red] [2:yellow, brown, green] [N/A:N/A] Foul Odor After Cleansing: [1:No] [2:Yes] [N/A:N/A] Odor Anticipated Due to [1:N/A] [2:No] [N/A:N/A] Product Use: Wound Margin: [1:Distinct, outline attached] [2:Distinct, outline attached] [N/A:N/A] Granulation Amount: [1:Medium (34-66%)] [2:Medium (34-66%)] [N/A:N/A] Granulation Quality: [1:Red] [2:Red, Hyper-granulation] [N/A:N/A] Necrotic Amount: Medium (34-66%) Medium (34-66%) N/A Necrotic Tissue: Adherent Slough Eschar, Adherent Slough N/A Exposed Structures: Fat Layer (Subcutaneous Fat Layer (Subcutaneous N/A Tissue) Exposed: Yes Tissue) Exposed: Yes Fascia: No Muscle: Yes Tendon: No Fascia: No Muscle: No Tendon: No Joint: No Joint: No Bone: No Bone: No Epithelialization: None None N/A Debridement: Debridement ([id number]) Debridement ([id number]) N/A Pre-procedure 14:14 14:14 N/A Verification/Time Out Taken: Pain Control: Lidocaine 4% Topical Solution Lidocaine 4% Topical Solution N/A Tissue Debrided: Fibrin/Slough, Exudates, Fibrin/Slough, Exudates, N/A Subcutaneous Subcutaneous Level: Skin/Subcutaneous Tissue Skin/Subcutaneous Tissue N/A Debridement Area (sq cm): 2.52 37.8 N/A Instrument: Curette Curette N/A Specimen: None Swab N/A Number of Specimens N/A 1 N/A Taken: Bleeding: Minimum Minimum N/A Hemostasis Achieved: Pressure Pressure N/A Procedural Pain: 0 0 N/A Post Procedural Pain: 0 0 N/A Debridement Treatment Procedure was tolerated well Procedure was tolerated well N/A Response: Post Debridement 1.5x2.4x0.8 8.4x4.5x0.8 N/A Measurements L x W x D (cm) Post Debridement Volume: 2.262 23.75 N/A (cm) Periwound Skin Texture: Excoriation: Yes Excoriation: Yes N/A Induration: Yes Induration: Yes Scarring: Yes Scarring: Yes Callus: No Callus: No Crepitus: No Crepitus: No Rash: No Rash: No Periwound Skin  Moisture: Maceration: Yes Maceration: Yes N/A Dry/Scaly: No Dry/Scaly: No Periwound Skin Color: Erythema: Yes Erythema: Yes N/A Atrophie Blanche: No Atrophie Blanche: No Cyanosis: No Cyanosis: No Ecchymosis: No Ecchymosis: No Hemosiderin Staining: No Hemosiderin Staining: No Mottled: No Mottled: No Pallor: No Pallor: No Rubor: No Rubor: No Erythema Location: Circumferential Circumferential N/A Temperature: No Abnormality No Abnormality N/A Tenderness on Palpation: Yes Yes N/A Wound Preparation: Ulcer Cleansing: Ulcer Cleansing: N/A Rinsed/Irrigated with Saline Rinsed/Irrigated with Saline Topical Anesthetic Applied: Topical Anesthetic Applied: Other: lidocaine 4% Other: lidocaine 4% Procedures Performed: Debridement Debridement N/A JYLLIAN, HAYNIE ([id number]) Treatment Notes Wound #1 (Right, Medial Lower Leg) 1. Cleansed with: Clean wound with Normal Saline Cleanse wound with antibacterial soap and water 2. Anesthetic Topical Lidocaine 4% cream to wound bed prior to debridement 4. Dressing Applied: Other dressing (specify in notes) 5. Secondary Dressing Applied ABD Pad Dry Gauze 7. Secured with Tape Notes silvercel, drawtex, charcoal, unna to anchor, kerlix, coban Wound #2 (Left, Lateral Lower Leg) 1. Cleansed with: Clean wound with Normal Saline Cleanse wound with antibacterial soap and water 2. Anesthetic Topical Lidocaine 4% cream to wound bed prior to debridement 4. Dressing Applied: Other dressing (specify in notes) 5. Secondary Dressing Applied ABD Pad Dry Gauze 7. Secured with Tape Notes silvercel, drawtex, charcoal, unna to anchor,  kerlix, Event organiser) Signed: 10/07/2017 4:49:25 PM By: Linton Ham MD Previous Signature: 10/07/2017 2:06:51 PM Version By: Alric Quan Entered By: Linton Ham on 10/07/2017 16:02:31 Goodhue, [name]  ([id number]) -------------------------------------------------------------------------------- [place] Details Patient Name: [name]. Date of Service: 10/07/2017 1:30 PM Medical Record Number: [id number] Patient Account Number: [id number] Date of Birth/Sex: 15-Jun-1946 (71 y.o. Female) Treating RN: Carolyne Fiscal, Debi Primary Care Marasia Newhall: Viviana Simpler Other Clinician: Referring Adelyn Roscher: Viviana Simpler Treating Lunetta Marina/Extender: Tito Dine in Treatment: 1 Active Inactive ` Abuse / Safety / Falls / Self Care Management Nursing Diagnoses: History of Falls Potential for falls Goals: Patient will not experience any injury related to falls Date Initiated: 09/30/2017 Target Resolution Date: 01/16/2018 Goal Status: Active Interventions: Assess Activities of Daily Living upon admission and as needed Assess fall risk on admission and as needed Assess: immobility, friction, shearing, incontinence upon admission and as needed Assess impairment of mobility on admission and as needed per policy Notes: ` Nutrition Nursing Diagnoses: Imbalanced nutrition Impaired glucose control: actual or potential Potential for alteratiion in Nutrition/Potential for imbalanced nutrition Goals: Patient/caregiver agrees to and verbalizes understanding of need to use nutritional supplements and/or vitamins as prescribed Date Initiated: 09/30/2017 Target Resolution Date: 01/16/2018 Goal Status: Active Patient/caregiver will maintain therapeutic glucose control Date Initiated: 09/30/2017 Target Resolution Date: 01/16/2018 Goal Status: Active Interventions: Assess patient nutrition upon admission and as needed per policy Provide education on elevated blood sugars and impact on wound healing Notes: HANAKO, TIPPING ([id number]) Orientation to the Wound Care Program Nursing Diagnoses: Knowledge deficit related to the wound healing center  program Goals: Patient/caregiver will verbalize understanding of the [facility] Date Initiated: 09/30/2017 Target Resolution Date: 10/17/2017 Goal Status: Active Interventions: Provide education on orientation to the wound center Notes: ` Pain, Acute or Chronic Nursing Diagnoses: Pain, acute or chronic: actual or potential Potential alteration in comfort, pain Goals: Patient/caregiver will verbalize adequate pain control between visits Date Initiated: 09/30/2017 Target Resolution Date: 01/16/2018 Goal Status: Active Interventions: Complete pain assessment as per visit requirements Notes: ` Wound/Skin Impairment Nursing Diagnoses: Impaired tissue integrity Knowledge deficit related to ulceration/compromised skin integrity Goals: Ulcer/skin breakdown will have a volume reduction of 80% by week 12 Date Initiated: 09/30/2017 Target Resolution Date: 01/16/2018 Goal Status: Active Interventions: Assess patient/caregiver ability to perform ulcer/skin care regimen upon admission and as needed Assess ulceration(s) every visit Notes: Electronic Signature(s) Signed: 10/09/2017 4:17:47 PM By: Alric Quan Previous Signature: 10/07/2017 2:06:44 PM Version By: Duffy Bruce, Kyndell M. ([id number]) Entered By: Alric Quan on 10/07/2017 14:13:28 [name] ([id number]) -------------------------------------------------------------------------------- Pain Assessment Details Patient Name: [name]. Date of Service: 10/07/2017 1:30 PM Medical Record Number: [id number] Patient Account Number: [id number] Date of Birth/Sex: 1945-10-31 (71 y.o. Female) Treating RN: Carolyne Fiscal, Debi Primary Care Joneric Streight: Viviana Simpler Other Clinician: Referring Jamarco Zaldivar: Viviana Simpler Treating Han Lysne/Extender: Tito Dine in Treatment: 1 Active Problems Location of Pain Severity and Description of Pain Patient Has Paino No Site Locations Pain  Management and Medication Current Pain Management: Electronic Signature(s) Signed: 10/09/2017 4:17:47 PM By: Alric Quan Entered By: Alric Quan on 10/07/2017 13:44:14 Oleson, [name] ([id number]) -------------------------------------------------------------------------------- Patient/Caregiver Education Details Patient Name: [name]. Date of Service: 10/07/2017 1:30 PM Medical Record Number: [id number] Patient Account Number: [id number] Date of Birth/Gender: 04-Aug-1946 (71 y.o. Female) Treating RN: Carolyne Fiscal, Debi Primary Care Physician: Viviana Simpler Other Clinician: Referring Physician: Viviana Simpler Treating Physician/Extender: Tito Dine in Treatment: 1 Education Assessment  Education Provided To: Patient Education Topics Provided Wound/Skin Impairment: Handouts: Caring for Your Ulcer, Other: change dressing as ordered Methods: Demonstration, Explain/Verbal Responses: State content correctly Electronic Signature(s) Signed: 10/09/2017 4:17:47 PM By: Alric Quan Entered By: Alric Quan on 10/07/2017 14:07:26 Woelfel, [name] ([id number]) -------------------------------------------------------------------------------- Wound Assessment Details Patient Name: [name]. Date of Service: 10/07/2017 1:30 PM Medical Record Number: [id number] Patient Account Number: [id number] Date of Birth/Sex: [birth year] (71 y.o. Female) Treating RN: Carolyne Fiscal, Debi Primary Care Merlyn Bollen: Viviana Simpler Other Clinician: Referring Leighton Luster: Viviana Simpler Treating Mekhi Sonn/Extender: Tito Dine in Treatment: 1 Wound Status Wound Number: 1 Primary Etiology: Diabetic Wound/Ulcer of the Lower Extremity Wound Location: Right Lower Leg - Medial Secondary Trauma, Other Wounding Event: Trauma Etiology: Date Acquired: 08/30/2017 Wound Status: Open Weeks Of Treatment: 1 Comorbid Hypertension, Type II Diabetes, Clustered Wound:  No History: Osteoarthritis, Neuropathy Photos Photo Uploaded By: Alric Quan on 10/07/2017 16:42:11 Wound Measurements Length: (cm) 1.5 Width: (cm) 2.1 Depth: (cm) 0.5 Area: (cm) 2.474 Volume: (cm) 1.237 % Reduction in Area: 70% % Reduction in Volume: -49.9% Epithelialization: None Tunneling: No Undermining: Yes Starting Position (o'clock): 12 Ending Position (o'clock): 12 Maximum Distance: (cm) 1.1 Wound Description Classification: Grade 2 Wound Margin: Distinct, outline attached Exudate Amount: Large Exudate Type: Sanguinous Exudate Color: red Foul Odor After Cleansing: No Slough/Fibrino Yes Wound Bed Granulation Amount: Medium (34-66%) Exposed Structure Granulation Quality: Red Fascia Exposed: No Necrotic Amount: Medium (34-66%) Fat Layer (Subcutaneous Tissue) Exposed: Yes Necrotic Quality: Adherent Slough Tendon Exposed: No Muscle Exposed: No Berberich, Sheritha M. ([id number]) Joint Exposed: No Bone Exposed: No Periwound Skin Texture Texture Color No Abnormalities Noted: No No Abnormalities Noted: No Callus: No Atrophie Blanche: No Crepitus: No Cyanosis: No Excoriation: Yes Ecchymosis: No Induration: Yes Erythema: Yes Rash: No Erythema Location: Circumferential Scarring: Yes Hemosiderin Staining: No Mottled: No Moisture Pallor: No No Abnormalities Noted: No Rubor: No Dry / Scaly: No Maceration: Yes Temperature / Pain Temperature: No Abnormality Tenderness on Palpation: Yes Wound Preparation Ulcer Cleansing: Rinsed/Irrigated with Saline Topical Anesthetic Applied: Other: lidocaine 4%, Treatment Notes Wound #1 (Right, Medial Lower Leg) 1. Cleansed with: Clean wound with Normal Saline Cleanse wound with antibacterial soap and water 2. Anesthetic Topical Lidocaine 4% cream to wound bed prior to debridement 4. Dressing Applied: Other dressing (specify in notes) 5. Secondary Dressing Applied ABD Pad Dry Gauze 7. Secured  with Tape Notes silvercel, drawtex, charcoal, unna to anchor, kerlix, coban Electronic Signature(s) Signed: 10/09/2017 4:17:47 PM By: Alric Quan Entered By: Alric Quan on 10/07/2017 13:56:40 Gillean, [name] ([id number]) -------------------------------------------------------------------------------- Wound Assessment Details Patient Name: [name]. Date of Service: 10/07/2017 1:30 PM Medical Record Number: [id number] Patient Account Number: [id number] Date of Birth/Sex: 1946/01/25 (71 y.o. Female) Treating RN: Carolyne Fiscal, Debi Primary Care Nahom Carfagno: Viviana Simpler Other Clinician: Referring Leafy Motsinger: Viviana Simpler Treating Tylicia Sherman/Extender: Tito Dine in Treatment: 1 Wound Status Wound Number: 2 Primary Etiology: Diabetic Wound/Ulcer of the Lower Extremity Wound Location: Left Lower Leg - Lateral Secondary Trauma, Other Wounding Event: Trauma Etiology: Date Acquired: 08/30/2017 Wound Status: Open Weeks Of Treatment: 1 Comorbid Hypertension, Type II Diabetes, Clustered Wound: No History: Osteoarthritis, Neuropathy Photos Photo Uploaded By: Alric Quan on 10/07/2017 16:42:40 Wound Measurements Length: (cm) 8.4 Width: (cm) 4.5 Depth: (cm) 0.5 Area: (cm) 29.688 Volume: (cm) 14.844 % Reduction in Area: -50% % Reduction in Volume: -7.1% Epithelialization: None Tunneling: Yes Position (o'clock): 6 Maximum Distance: (cm) 3.9 Undermining: No Wound Description Classification: Grade 2 Wound Margin: Distinct, outline attached Exudate  Amount: Large Exudate Type: Purulent Exudate Color: yellow, brown, green Foul Odor After Cleansing: Yes Due to Product Use: No Slough/Fibrino Yes Wound Bed Granulation Amount: Medium (34-66%) Exposed Structure Granulation Quality: Red, Hyper-granulation Fascia Exposed: No Necrotic Amount: Medium (34-66%) Fat Layer (Subcutaneous Tissue) Exposed: Yes Necrotic Quality: Eschar, Adherent Slough Tendon  Exposed: No Muscle Exposed: Yes Necrosis of Muscle: No Gaultney, FLORICE HINDLE. ([id number]) Joint Exposed: No Bone Exposed: No Periwound Skin Texture Texture Color No Abnormalities Noted: No No Abnormalities Noted: No Callus: No Atrophie Blanche: No Crepitus: No Cyanosis: No Excoriation: Yes Ecchymosis: No Induration: Yes Erythema: Yes Rash: No Erythema Location: Circumferential Scarring: Yes Hemosiderin Staining: No Mottled: No Moisture Pallor: No No Abnormalities Noted: No Rubor: No Dry / Scaly: No Maceration: Yes Temperature / Pain Temperature: No Abnormality Tenderness on Palpation: Yes Wound Preparation Ulcer Cleansing: Rinsed/Irrigated with Saline Topical Anesthetic Applied: Other: lidocaine 4%, Treatment Notes Wound #2 (Left, Lateral Lower Leg) 1. Cleansed with: Clean wound with Normal Saline Cleanse wound with antibacterial soap and water 2. Anesthetic Topical Lidocaine 4% cream to wound bed prior to debridement 4. Dressing Applied: Other dressing (specify in notes) 5. Secondary Dressing Applied ABD Pad Dry Gauze 7. Secured with Tape Notes silvercel, drawtex, charcoal, unna to anchor, kerlix, coban Electronic Signature(s) Signed: 10/09/2017 4:17:47 PM By: Alric Quan Entered By: Alric Quan on 10/07/2017 13:59:02 Charrette, [name] ([id number]) -------------------------------------------------------------------------------- [place] Details Patient Name: [name]. Date of Service: 10/07/2017 1:30 PM Medical Record Number: [id number] Patient Account Number: [id number] Date of Birth/Sex: 1946-07-02 (71 y.o. Female) Treating RN: Carolyne Fiscal, Debi Primary Care Jenah Vanasten: Viviana Simpler Other Clinician: Referring Elsia Lasota: Viviana Simpler Treating Awanda Wilcock/Extender: Tito Dine in Treatment: 1 Vital Signs Time Taken: 13:45 Temperature (F): 97.6 Height (in): 61 Pulse (bpm): 84 Weight (lbs): 179.1 Respiratory Rate (breaths/min):  18 Body Mass Index (BMI): 33.8 Blood Pressure (mmHg): 154/85 Reference Range: 80 - 120 mg / dl Electronic Signature(s) Signed: 10/09/2017 4:17:47 PM By: Alric Quan Entered By: Alric Quan on 10/07/2017 13:46:29

## 2017-10-12 ENCOUNTER — Encounter: Payer: Medicare Other | Attending: Physician Assistant

## 2017-10-12 DIAGNOSIS — E1151 Type 2 diabetes mellitus with diabetic peripheral angiopathy without gangrene: Secondary | ICD-10-CM | POA: Diagnosis not present

## 2017-10-12 DIAGNOSIS — L97218 Non-pressure chronic ulcer of right calf with other specified severity: Secondary | ICD-10-CM | POA: Insufficient documentation

## 2017-10-12 DIAGNOSIS — I87331 Chronic venous hypertension (idiopathic) with ulcer and inflammation of right lower extremity: Secondary | ICD-10-CM | POA: Insufficient documentation

## 2017-10-12 DIAGNOSIS — I10 Essential (primary) hypertension: Secondary | ICD-10-CM | POA: Diagnosis not present

## 2017-10-12 DIAGNOSIS — E1140 Type 2 diabetes mellitus with diabetic neuropathy, unspecified: Secondary | ICD-10-CM | POA: Insufficient documentation

## 2017-10-12 DIAGNOSIS — L97223 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e: Secondary | ICD-10-CM | POA: Diagnosis not present

## 2017-10-12 DIAGNOSIS — E11622 Type 2 diabetes mellitus with other skin ulcer: Secondary | ICD-10-CM | POA: Insufficient documentation

## 2017-10-13 NOTE — Progress Notes (Signed)
HIBAH, ODONNELL [place] ([id number]) Visit Report for 10/12/2017 Arrival Information Details Patient Name: [name], [name]. Date of Service: 10/12/2017 12:30 PM Medical Record Number: [id number] Patient Account Number: [id number] Date of Birth/Sex: [birth year] (71 y.o. ) Treating RN: Roger Shelter Primary Care Lachanda Buczek: Viviana Simpler Other Clinician: Referring Fusae Florio: Viviana Simpler Treating Lupie Sawa/Extender: Melburn Hake, HOYT Weeks in Treatment: 1 Visit Information History Since Last Visit All ordered tests and consults were completed: No Patient Arrived: Ambulatory Added or deleted any medications: No Arrival Time: 12:37 Any new allergies or adverse reactions: No Accompanied By: self Had a fall or experienced change in No Transfer Assistance: None activities of daily living that may affect Patient Identification Verified: Yes risk of falls: Secondary Verification Process Completed: Yes Signs or symptoms of abuse/neglect since last visito No Patient Requires Transmission-Based No Hospitalized since last visit: No Precautions: Pain Present Now: No Patient Has Alerts: Yes Patient Alerts: DM II Electronic Signature(s) Signed: 10/12/2017 4:43:00 PM By: Roger Shelter Entered By: Roger Shelter on 10/12/2017 12:39:28 [name] ([id number]) -------------------------------------------------------------------------------- Clinic Level of Care Assessment Details Patient Name: [name]. Date of Service: 10/12/2017 12:30 PM Medical Record Number: [id number] Patient Account Number: [id number] Date of Birth/Sex: [birth year] (71 y.o. ) Treating RN: Roger Shelter Primary Care Fable Huisman: Viviana Simpler Other Clinician: Referring Binyamin Nelis: Viviana Simpler Treating Courage Biglow/Extender: Melburn Hake, HOYT Weeks in Treatment: 1 Clinic Level of Care Assessment Items TOOL 4 Quantity Score []  - Use when only an EandM is performed on FOLLOW-UP visit 0 ASSESSMENTS - Nursing Assessment /  Reassessment []  - Reassessment of Co-morbidities (includes updates in patient status) 0 X- 1 5 Reassessment of Adherence to Treatment Plan ASSESSMENTS - Wound and Skin Assessment / Reassessment []  - Simple Wound Assessment / Reassessment - one wound 0 X- 2 5 Complex Wound Assessment / Reassessment - multiple wounds []  - 0 Dermatologic / Skin Assessment (not related to wound area) ASSESSMENTS - Focused Assessment []  - Circumferential Edema Measurements - multi extremities 0 []  - 0 Nutritional Assessment / Counseling / Intervention []  - 0 Lower Extremity Assessment (monofilament, tuning fork, pulses) []  - 0 Peripheral Arterial Disease Assessment (using hand held doppler) ASSESSMENTS - Ostomy and/or Continence Assessment and Care []  - Incontinence Assessment and Management 0 []  - 0 Ostomy Care Assessment and Management (repouching, etc.) PROCESS - Coordination of Care []  - Simple Patient / Family Education for ongoing care 0 X- 1 20 Complex (extensive) Patient / Family Education for ongoing care []  - 0 Staff obtains Programmer, systems, Records, Test Results / Process Orders []  - 0 Staff telephones HHA, Nursing Homes / Clarify orders / etc []  - 0 Routine Transfer to another Facility (non-emergent condition) []  - 0 Routine Hospital Admission (non-emergent condition) []  - 0 New Admissions / Biomedical engineer / Ordering NPWT, Apligraf, etc. []  - 0 Emergency Hospital Admission (emergent condition) []  - 0 Simple Discharge Coordination EVANTHIA, MAUND M. ([id number]) X- 1 15 Complex (extensive) Discharge Coordination PROCESS - Special Needs []  - Pediatric / Minor Patient Management 0 []  - 0 Isolation Patient Management []  - 0 Hearing / Language / Visual special needs []  - 0 Assessment of Community assistance (transportation, D/C planning, etc.) []  - 0 Additional assistance / Altered mentation []  - 0 Support Surface(s) Assessment (bed, cushion, seat, etc.) INTERVENTIONS - Wound  Cleansing / Measurement []  - Simple Wound Cleansing - one wound 0 X- 2 5 Complex Wound Cleansing - multiple wounds X- 1 5 Wound Imaging (photographs - any number of wounds) []  - 0  Wound Tracing (instead of photographs) []  - 0 Simple Wound Measurement - one wound X- 2 5 Complex Wound Measurement - multiple wounds INTERVENTIONS - Wound Dressings []  - Small Wound Dressing one or multiple wounds 0 X- 2 15 Medium Wound Dressing one or multiple wounds []  - 0 Large Wound Dressing one or multiple wounds []  - 0 Application of Medications - topical []  - 0 Application of Medications - injection INTERVENTIONS - Miscellaneous []  - External ear exam 0 []  - 0 Specimen Collection (cultures, biopsies, blood, body fluids, etc.) []  - 0 Specimen(s) / Culture(s) sent or taken to Lab for analysis []  - 0 Patient Transfer (multiple staff / Civil Service fast streamer / Similar devices) []  - 0 Simple Staple / Suture removal (25 or less) []  - 0 Complex Staple / Suture removal (26 or more) []  - 0 Hypo / Hyperglycemic Management (close monitor of Blood Glucose) []  - 0 Ankle / Brachial Index (ABI) - do not check if billed separately X- 1 5 Vital Signs Santoro, Carolie M. ([id number]) Has the patient been seen at the hospital within the last three years: Yes Total Score: 110 Level Of Care: New/Established - Level 3 Electronic Signature(s) Signed: 10/12/2017 4:43:00 PM By: Roger Shelter Entered By: Roger Shelter on 10/12/2017 13:33:36 Rohl, [name] ([id number]) -------------------------------------------------------------------------------- Compression Therapy Details Patient Name: [name]. Date of Service: 10/12/2017 12:30 PM Medical Record Number: [id number] Patient Account Number: [id number] Date of Birth/Sex: [birth year] (71 y.o. ) Treating RN: Roger Shelter Primary Care Rickardo Brinegar: Viviana Simpler Other Clinician: Referring Myrella Fahs: Viviana Simpler Treating Treyshawn Muldrew/Extender: Melburn Hake,  HOYT Weeks in Treatment: 1 Compression Therapy Performed for Wound Assessment: Wound #2 Left,Lateral Lower Leg Performed By: Clinician Roger Shelter, RN Compression Type: Double Layer Electronic Signature(s) Signed: 10/12/2017 4:43:00 PM By: Roger Shelter Entered By: Roger Shelter on 10/12/2017 13:31:09 [name] ([id number]) -------------------------------------------------------------------------------- Compression Therapy Details Patient Name: [name]. Date of Service: 10/12/2017 12:30 PM Medical Record Number: [id number] Patient Account Number: [id number] Date of Birth/Sex: 1945-10-14 (71 y.o. ) Treating RN: Roger Shelter Primary Care Caedon Bond: Viviana Simpler Other Clinician: Referring Meleena Munroe: Viviana Simpler Treating Damyen Knoll/Extender: Melburn Hake, HOYT Weeks in Treatment: 1 Compression Therapy Performed for Wound Assessment: Wound #1 Right,Medial Lower Leg Performed By: Clinician Roger Shelter, RN Compression Type: Double Layer Electronic Signature(s) Signed: 10/12/2017 4:43:00 PM By: Roger Shelter Entered By: Roger Shelter on 10/12/2017 13:31:09 [name] ([id number]) -------------------------------------------------------------------------------- Encounter Discharge Information Details Patient Name: [name]. Date of Service: 10/12/2017 12:30 PM Medical Record Number: [id number] Patient Account Number: [id number] Date of Birth/Sex: 02-16-1946 (71 y.o. ) Treating RN: Roger Shelter Primary Care Lenia Housley: Viviana Simpler Other Clinician: Referring Beretta Ginsberg: Viviana Simpler Treating Kaleem Sartwell/Extender: Melburn Hake, HOYT Weeks in Treatment: 1 Encounter Discharge Information Items Discharge Pain Level: 0 Discharge Condition: Stable Ambulatory Status: Ambulatory Discharge Destination: Home Private Transportation: Auto Accompanied By: self Schedule Follow-up Appointment: Yes Medication Reconciliation completed and No provided  to Patient/Care Samanyu Tinnell: Clinical Summary of Care: Electronic Signature(s) Signed: 10/12/2017 4:43:00 PM By: Roger Shelter Entered By: Roger Shelter on 10/12/2017 13:32:59 [name] ([id number]) -------------------------------------------------------------------------------- Patient/Caregiver Education Details Patient Name: [name]. Date of Service: 10/12/2017 12:30 PM Medical Record Number: [id number] Patient Account Number: [id number] Date of Birth/Gender: 02/14/1946 (71 y.o. ) Treating RN: Roger Shelter Primary Care Physician: Viviana Simpler Other Clinician: Referring Physician: Viviana Simpler Treating Physician/Extender: Melburn Hake, HOYT Weeks in Treatment: 1 Education Assessment Education Provided To: Patient Education Topics Provided Wound/Skin Impairment: Handouts: Caring for Your Ulcer Methods: Explain/Verbal  Responses: State content correctly Electronic Signature(s) Signed: 10/12/2017 4:43:00 PM By: Roger Shelter Entered By: Roger Shelter on 10/12/2017 13:32:31 [name] ([id number]) -------------------------------------------------------------------------------- Wound Assessment Details Patient Name: [name]. Date of Service: 10/12/2017 12:30 PM Medical Record Number: [id number] Patient Account Number: [id number] Date of Birth/Sex: 1946-04-26 (71 y.o. ) Treating RN: Roger Shelter Primary Care Zuley Lutter: Viviana Simpler Other Clinician: Referring Julie Nay: Viviana Simpler Treating Dondi Burandt/Extender: STONE III, HOYT Weeks in Treatment: 1 Wound Status Wound Number: 1 Primary Diabetic Wound/Ulcer of the Lower Etiology: Extremity Wound Location: Right Lower Leg - Medial Secondary Trauma, Other Wounding Event: Trauma Etiology: Date Acquired: 08/30/2017 Wound Status: Open Weeks Of Treatment: 1 Comorbid Hypertension, Type II Diabetes, Clustered Wound: No History: Osteoarthritis, Neuropathy Photos Photo Uploaded By:  Roger Shelter on 10/12/2017 14:57:00 Wound Measurements Length: (cm) 1.2 % Reduct Width: (cm) 2.5 % Reduct Depth: (cm) 1.1 Epitheli Area: (cm) 2.356 Undermi Volume: (cm) 2.592 [address] En Ma Locat St En Ma ion in Area: 71.4% ion in Volume: -214.2% alization: None ning: Yes tion 1 arting Position (o'clock): 9 ding Position (o'clock): 1 ximum Distance: (cm) 1.2 ion 2 arting Position (o'clock): 4 ding Position (o'clock): 7 ximum Distance: (cm) 1.2 Wound Description Classification: Grade 2 Wound Margin: Distinct, outline attached Exudate Amount: Large Exudate Type: Sanguinous Exudate Color: red Foul Odor After Cleansing: Yes Due to Product Use: No Slough/Fibrino Yes Wound Bed Granulation Amount: Medium (34-66%) Exposed Structure Mahan, Maigen M. ([id number]) Granulation Quality: Red Fascia Exposed: No Necrotic Amount: Medium (34-66%) Fat Layer (Subcutaneous Tissue) Exposed: Yes Necrotic Quality: Adherent Slough Tendon Exposed: No Muscle Exposed: No Joint Exposed: No Bone Exposed: No Periwound Skin Texture Texture Color No Abnormalities Noted: No No Abnormalities Noted: No Callus: No Atrophie Blanche: No Crepitus: No Cyanosis: No Excoriation: Yes Ecchymosis: No Induration: Yes Erythema: Yes Rash: No Erythema Location: Circumferential Scarring: Yes Hemosiderin Staining: No Mottled: No Moisture Pallor: No No Abnormalities Noted: No Rubor: No Dry / Scaly: No Maceration: Yes Temperature / Pain Temperature: No Abnormality Tenderness on Palpation: Yes Wound Preparation Ulcer Cleansing: Rinsed/Irrigated with Saline Topical Anesthetic Applied: None Electronic Signature(s) Signed: 10/12/2017 4:43:00 PM By: Roger Shelter Entered By: Roger Shelter on 10/12/2017 12:54:30 Harker, [name] ([id number]) -------------------------------------------------------------------------------- Wound Assessment Details Patient Name: [name]. Date  of Service: 10/12/2017 12:30 PM Medical Record Number: [id number] Patient Account Number: [id number] Date of Birth/Sex: 27-Dec-1945 (71 y.o. ) Treating RN: Roger Shelter Primary Care Zhane Bluitt: Viviana Simpler Other Clinician: Referring Raianna Slight: Viviana Simpler Treating Kimiyo Carmicheal/Extender: Melburn Hake, HOYT Weeks in Treatment: 1 Wound Status Wound Number: 2 Primary Diabetic Wound/Ulcer of the Lower Etiology: Extremity Wound Location: Left Lower Leg - Lateral Secondary Trauma, Other Wounding Event: Trauma Etiology: Date Acquired: 08/30/2017 Wound Status: Open Weeks Of Treatment: 1 Comorbid Hypertension, Type II Diabetes, Clustered Wound: No History: Osteoarthritis, Neuropathy Photos Photo Uploaded By: Roger Shelter on 10/12/2017 14:57:00 Wound Measurements Length: (cm) 7.8 % Reduct Width: (cm) 4.2 % Reduct Depth: (cm) 0.5 Epitheli Area: (cm) 25.73 Tunneli Volume: (cm) 12.865 Posi ion in Area: -30% ion in Volume: 7.1% alization: None ng: Yes tion (o'clock): 6 Undermining: No Wound Description Classification: Grade 2 Foul Wound Margin: Distinct, outline attached Due Exudate Amount: Large Slou Exudate Type: Purulent Exudate Color: yellow, brown, green Odor After Cleansing: Yes to Product Use: No gh/Fibrino Yes Wound Bed Granulation Amount: Medium (34-66%) Exposed Structure Granulation Quality: Red, Hyper-granulation Fascia Exposed: No Necrotic Amount: Medium (34-66%) Fat Layer (Subcutaneous Tissue) Exposed: Yes Necrotic Quality: Eschar, Adherent Slough Tendon Exposed: No  Muscle Exposed: Yes Necrosis of Muscle: No Joint Exposed: No Ferrara, Nohely M. ([id number]) Bone Exposed: No Periwound Skin Texture Texture Color No Abnormalities Noted: No No Abnormalities Noted: No Callus: No Atrophie Blanche: No Crepitus: No Cyanosis: No Excoriation: Yes Ecchymosis: No Induration: Yes Erythema: Yes Rash: No Erythema Location: Circumferential Scarring:  Yes Hemosiderin Staining: No Mottled: No Moisture Pallor: No No Abnormalities Noted: No Rubor: No Dry / Scaly: No Maceration: Yes Temperature / Pain Temperature: No Abnormality Tenderness on Palpation: Yes Wound Preparation Ulcer Cleansing: Rinsed/Irrigated with Saline Topical Anesthetic Applied: None Electronic Signature(s) Signed: 10/12/2017 4:43:00 PM By: Roger Shelter Entered By: Roger Shelter on 10/12/2017 13:29:40

## 2017-10-14 ENCOUNTER — Encounter: Payer: Medicare Other | Admitting: Internal Medicine

## 2017-10-14 DIAGNOSIS — L97218 Non-pressure chronic ulcer of right calf with other specified severity: Secondary | ICD-10-CM | POA: Diagnosis not present

## 2017-10-14 DIAGNOSIS — E1151 Type 2 diabetes mellitus with diabetic peripheral angiopathy without gangrene: Secondary | ICD-10-CM | POA: Diagnosis not present

## 2017-10-14 DIAGNOSIS — L97222 Non-pressure chronic ulcer of left calf with fat layer exposed: Secondary | ICD-10-CM | POA: Diagnosis not present

## 2017-10-14 DIAGNOSIS — I87331 Chronic venous hypertension (idiopathic) with ulcer and inflammation of right lower extremity: Secondary | ICD-10-CM | POA: Diagnosis not present

## 2017-10-14 DIAGNOSIS — E1140 Type 2 diabetes mellitus with diabetic neuropathy, unspecified: Secondary | ICD-10-CM | POA: Diagnosis not present

## 2017-10-14 DIAGNOSIS — L97212 Non-pressure chronic ulcer of right calf with fat layer exposed: Secondary | ICD-10-CM | POA: Diagnosis not present

## 2017-10-14 DIAGNOSIS — E11622 Type 2 diabetes mellitus with other skin ulcer: Secondary | ICD-10-CM | POA: Diagnosis not present

## 2017-10-14 DIAGNOSIS — I10 Essential (primary) hypertension: Secondary | ICD-10-CM | POA: Diagnosis not present

## 2017-10-14 DIAGNOSIS — L97223 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e: Secondary | ICD-10-CM | POA: Diagnosis not present

## 2017-10-16 DIAGNOSIS — E11622 Type 2 diabetes mellitus with other skin ulcer: Secondary | ICD-10-CM | POA: Diagnosis not present

## 2017-10-16 DIAGNOSIS — L97223 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e: Secondary | ICD-10-CM | POA: Diagnosis not present

## 2017-10-16 DIAGNOSIS — E1140 Type 2 diabetes mellitus with diabetic neuropathy, unspecified: Secondary | ICD-10-CM | POA: Diagnosis not present

## 2017-10-16 DIAGNOSIS — I87331 Chronic venous hypertension (idiopathic) with ulcer and inflammation of right lower extremity: Secondary | ICD-10-CM | POA: Diagnosis not present

## 2017-10-16 DIAGNOSIS — E1151 Type 2 diabetes mellitus with diabetic peripheral angiopathy without gangrene: Secondary | ICD-10-CM | POA: Diagnosis not present

## 2017-10-16 DIAGNOSIS — L97218 Non-pressure chronic ulcer of right calf with other specified severity: Secondary | ICD-10-CM | POA: Diagnosis not present

## 2017-10-16 DIAGNOSIS — I10 Essential (primary) hypertension: Secondary | ICD-10-CM | POA: Diagnosis not present

## 2017-10-16 NOTE — Progress Notes (Signed)
[name], [name] [place] ([id number]) Visit Report for 10/14/2017 Arrival Information Details Patient Name: [name], [name]. Date of Service: 10/14/2017 2:15 PM Medical Record Number: [id number] Patient Account Number: [id number] Date of Birth/Sex: 08/26/1946 (72 y.o. Female) Treating RN: Ahmed Prima Primary Care Latissa Frick: Viviana Simpler Other Clinician: Referring Lerae Langham: Viviana Simpler Treating Christoher Drudge/Extender: Tito Dine in Treatment: 2 Visit Information History Since Last Visit All ordered tests and consults were completed: No Patient Arrived: Ambulatory Added or deleted any medications: No Arrival Time: 14:18 Any new allergies or adverse reactions: No Accompanied By: self Had a fall or experienced change in No Transfer Assistance: None activities of daily living that may affect Patient Identification Verified: Yes risk of falls: Secondary Verification Process Completed: Yes Signs or symptoms of abuse/neglect since last visito No Patient Requires Transmission-Based No Hospitalized since last visit: No Precautions: Has Dressing in Place as Prescribed: Yes Patient Has Alerts: Yes Has Compression in Place as Prescribed: Yes Patient Alerts: DM II Pain Present Now: No Electronic Signature(s) Signed: 10/15/2017 3:16:44 PM By: Alric Quan Entered By: Alric Quan on 10/14/2017 14:18:41 Youtsey, [name] ([id number]) -------------------------------------------------------------------------------- Encounter Discharge Information Details Patient Name: [name]. Date of Service: 10/14/2017 2:15 PM Medical Record Number: [id number] Patient Account Number: [id number] Date of Birth/Sex: Sep 11, 1945 (72 y.o. Female) Treating RN: Carolyne Fiscal, Debi Primary Care Rocklin Soderquist: Viviana Simpler Other Clinician: Referring Carter Kaman: Viviana Simpler Treating Waqas Bruhl/Extender: Tito Dine in Treatment: 2 Encounter Discharge Information Items Discharge Pain Level:  0 Discharge Condition: Stable Ambulatory Status: Ambulatory Discharge Destination: Home Transportation: Private Auto Accompanied By: self Schedule Follow-up Appointment: Yes Medication Reconciliation completed and No provided to Patient/Care Irelynn Schermerhorn: Provided on Clinical Summary of Care: 10/14/2017 Form Type Recipient Paper Patient LK Electronic Signature(s) Signed: 10/15/2017 3:16:44 PM By: Alric Quan Entered By: Alric Quan on 10/14/2017 15:04:39 Vanhise, [name] ([id number]) -------------------------------------------------------------------------------- Lower Extremity Assessment Details Patient Name: [name]. Date of Service: 10/14/2017 2:15 PM Medical Record Number: [id number] Patient Account Number: [id number] Date of Birth/Sex: May 01, 1946 (72 y.o. Female) Treating RN: Carolyne Fiscal, Debi Primary Care Gina Costilla: Viviana Simpler Other Clinician: Referring Mitsugi Schrader: Viviana Simpler Treating Cecilee Rosner/Extender: Tito Dine in Treatment: 2 Edema Assessment Assessed: [Left: No] [Right: No] [Left: Edema] [Right: :] Calf Left: Right: Point of Measurement: 34 cm From Medial Instep 35.7 cm 36.6 cm Ankle Left: Right: Point of Measurement: 9 cm From Medial Instep 23.6 cm 25 cm Vascular Assessment Pulses: Dorsalis Pedis Palpable: [Left:Yes] [Right:Yes] Posterior Tibial Extremity colors, hair growth, and conditions: Extremity Color: [Left:Mottled] [Right:Mottled] Temperature of Extremity: [Left:Cool] [Right:Cool] Capillary Refill: [Left:< 3 seconds] [Right:< 3 seconds] Toe Nail Assessment Left: Right: Thick: Yes Yes Discolored: No No Deformed: No No Improper Length and Hygiene: Yes Yes Electronic Signature(s) Signed: 10/15/2017 3:16:44 PM By: Alric Quan Entered By: Alric Quan on 10/14/2017 14:39:55 Aarons, [name] ([id number]) -------------------------------------------------------------------------------- Multi Wound Chart  Details Patient Name: [name]. Date of Service: 10/14/2017 2:15 PM Medical Record Number: [id number] Patient Account Number: [id number] Date of Birth/Sex: 1946-08-04 (72 y.o. Female) Treating RN: Carolyne Fiscal, Debi Primary Care Krissie Merrick: Viviana Simpler Other Clinician: Referring Sherrell Weir: Viviana Simpler Treating Sadeel Fiddler/Extender: Tito Dine in Treatment: 2 Vital Signs Height(in): 61 Pulse(bpm): 97 Weight(lbs): 179.1 Blood Pressure(mmHg): 139/69 Body Mass Index(BMI): 34 Temperature(F): 98.6 Respiratory Rate 18 (breaths/min): Photos: [1:No Photos] [2:No Photos] [N/A:N/A] Wound Location: [1:Right Lower Leg - Medial] [2:Left Lower Leg - Lateral] [N/A:N/A] Wounding Event: [1:Trauma] [2:Trauma] [N/A:N/A] Primary Etiology: [1:Diabetic Wound/Ulcer of the Lower Extremity] [2:Diabetic Wound/Ulcer of the Lower  Extremity] [N/A:N/A] Secondary Etiology: [1:Trauma, Other] [2:Trauma, Other] [N/A:N/A] Comorbid History: [1:Hypertension, Type II Diabetes, Osteoarthritis, Neuropathy] [2:Hypertension, Type II Diabetes, Osteoarthritis, Neuropathy] [N/A:N/A] Date Acquired: [1:08/30/2017] [2:08/30/2017] [N/A:N/A] Weeks of Treatment: [1:2] [2:2] [N/A:N/A] Wound Status: [1:Open] [2:Open] [N/A:N/A] Measurements L x W x D [1:2x2.6x0.8] [2:8x3.7x0.8] [N/A:N/A] (cm) Area (cm) : [1:4.084] [2:23.248] [N/A:N/A] Volume (cm) : [1:3.267] [2:18.598] [N/A:N/A] % Reduction in Area: [1:50.50%] [2:-17.50%] [N/A:N/A] % Reduction in Volume: [1:-296.00%] [2:-34.20%] [N/A:N/A] Position 1 (o'clock): [2:6] Maximum Distance 1 (cm): [2:4.3] Starting Position 1 [1:6] (o'clock): Ending Position 1 [1:12] (o'clock): Maximum Distance 1 (cm): [1:1.1] Tunneling: [1:No] [2:Yes] [N/A:N/A] Undermining: [1:Yes] [2:No] [N/A:N/A] Classification: [1:Grade 2] [2:Grade 2] [N/A:N/A] Exudate Amount: [1:Large] [2:Large] [N/A:N/A] Exudate Type: [1:Serosanguineous] [2:Serosanguineous] [N/A:N/A] Exudate Color:  [1:red, brown] [2:red, brown] [N/A:N/A] Foul Odor After Cleansing: [1:Yes] [2:Yes] [N/A:N/A] Odor Anticipated Due to [1:No] [2:No] [N/A:N/A] Product Use: Wound Margin: [1:Distinct, outline attached] [2:Distinct, outline attached] [N/A:N/A] Granulation Amount: [1:Small (1-33%)] [2:Medium (34-66%)] [N/A:N/A] Granulation Quality: [1:Red] [2:Red, Hyper-granulation] [N/A:N/A] Necrotic Amount: Large (67-100%) Medium (34-66%) N/A Necrotic Tissue: Eschar, Adherent Slough Eschar, Adherent Slough N/A Exposed Structures: Fat Layer (Subcutaneous Fat Layer (Subcutaneous N/A Tissue) Exposed: Yes Tissue) Exposed: Yes Fascia: No Muscle: Yes Tendon: No Fascia: No Muscle: No Tendon: No Joint: No Joint: No Bone: No Bone: No Epithelialization: None None N/A Debridement: Debridement ([id number]) Debridement ([id number]) N/A Pre-procedure 14:42 14:42 N/A Verification/Time Out Taken: Pain Control: Lidocaine 4% Topical Solution Lidocaine 4% Topical Solution N/A Tissue Debrided: Fibrin/Slough, Exudates, Fibrin/Slough, Exudates, N/A Subcutaneous Subcutaneous Level: Skin/Subcutaneous Tissue Skin/Subcutaneous Tissue N/A Debridement Area (sq cm): 5.2 29.6 N/A Instrument: Curette Curette N/A Bleeding: Minimum Minimum N/A Hemostasis Achieved: Pressure Pressure N/A Procedural Pain: 0 0 N/A Post Procedural Pain: 0 0 N/A Debridement Treatment Procedure was tolerated well Procedure was tolerated well N/A Response: Post Debridement 2x2.6x0.9 8x3.7x0.9 N/A Measurements L x W x D (cm) Post Debridement Volume: 3.676 20.923 N/A (cm) Periwound Skin Texture: Excoriation: Yes Excoriation: Yes N/A Induration: Yes Induration: Yes Scarring: Yes Scarring: Yes Callus: No Callus: No Crepitus: No Crepitus: No Rash: No Rash: No Periwound Skin Moisture: Maceration: Yes Maceration: Yes N/A Dry/Scaly: No Dry/Scaly: No Periwound Skin Color: Erythema: Yes Erythema: Yes N/A Atrophie Blanche: No Atrophie  Blanche: No Cyanosis: No Cyanosis: No Ecchymosis: No Ecchymosis: No Hemosiderin Staining: No Hemosiderin Staining: No Mottled: No Mottled: No Pallor: No Pallor: No Rubor: No Rubor: No Erythema Location: Circumferential Circumferential N/A Temperature: No Abnormality No Abnormality N/A Tenderness on Palpation: Yes Yes N/A Wound Preparation: Ulcer Cleansing: Ulcer Cleansing: N/A Rinsed/Irrigated with Saline Rinsed/Irrigated with Saline Topical Anesthetic Applied: Topical Anesthetic Applied: Other: lidocaine 4% Other: lidocaine 4% Procedures Performed: Debridement Debridement N/A Treatment Notes [place], [place]. ([id number]) Wound #1 (Right, Medial Lower Leg) 1. Cleansed with: Clean wound with Normal Saline 2. Anesthetic Topical Lidocaine 4% cream to wound bed prior to debridement 4. Dressing Applied: Other dressing (specify in notes) 5. Secondary Dressing Applied ABD Pad Dry Gauze Kerlix/Conform 7. Secured with Tape Notes xtrasorb, carboflex, unna to anchor, silvercel, kerlix, coban Wound #2 (Left, Lateral Lower Leg) 1. Cleansed with: Clean wound with Normal Saline 2. Anesthetic Topical Lidocaine 4% cream to wound bed prior to debridement 4. Dressing Applied: Other dressing (specify in notes) 5. Secondary Dressing Applied ABD Pad Dry Gauze Kerlix/Conform 7. Secured with Tape Notes xtrasorb, carboflex, unna to anchor, silvercel, kerlix, coban Electronic Signature(s) Signed: 10/14/2017 4:49:28 PM By: Linton Ham MD Entered By: Linton Ham on 10/14/2017 15:52:14 [name], [name] ([id number]) -------------------------------------------------------------------------------- [place] Details Patient Name:  [name], [name] Date of Service: 10/14/2017 2:15 PM Medical Record Number: [id number] Patient Account Number: [id number] Date of Birth/Sex: 1946/07/15 (72 y.o. Female) Treating RN: Carolyne Fiscal, Debi Primary Care Breionna Punt: Viviana Simpler Other Clinician: Referring Mayola Mcbain: Viviana Simpler Treating Algernon Mundie/Extender: Tito Dine in Treatment: 2 Active Inactive ` Abuse / Safety / Falls / Self Care Management Nursing Diagnoses: History of Falls Potential for falls Goals: Patient will not experience any injury related to falls Date Initiated: 09/30/2017 Target Resolution Date: 01/16/2018 Goal Status: Active Interventions: Assess Activities of Daily Living upon admission and as needed Assess fall risk on admission and as needed Assess: immobility, friction, shearing, incontinence upon admission and as needed Assess impairment of mobility on admission and as needed per policy Notes: ` Nutrition Nursing Diagnoses: Imbalanced nutrition Impaired glucose control: actual or potential Potential for alteratiion in Nutrition/Potential for imbalanced nutrition Goals: Patient/caregiver agrees to and verbalizes understanding of need to use nutritional supplements and/or vitamins as prescribed Date Initiated: 09/30/2017 Target Resolution Date: 01/16/2018 Goal Status: Active Patient/caregiver will maintain therapeutic glucose control Date Initiated: 09/30/2017 Target Resolution Date: 01/16/2018 Goal Status: Active Interventions: Assess patient nutrition upon admission and as needed per policy Provide education on elevated blood sugars and impact on wound healing Notes: [name], [name] ([id number]) Orientation to the Wound Care Program Nursing Diagnoses: Knowledge deficit related to the wound healing center program Goals: Patient/caregiver will verbalize understanding of the [facility] Date Initiated: 09/30/2017 Target Resolution Date: 10/17/2017 Goal Status: Active Interventions: Provide education on orientation to the wound center Notes: ` Pain, Acute or Chronic Nursing Diagnoses: Pain, acute or chronic: actual or potential Potential alteration in comfort,  pain Goals: Patient/caregiver will verbalize adequate pain control between visits Date Initiated: 09/30/2017 Target Resolution Date: 01/16/2018 Goal Status: Active Interventions: Complete pain assessment as per visit requirements Notes: ` Wound/Skin Impairment Nursing Diagnoses: Impaired tissue integrity Knowledge deficit related to ulceration/compromised skin integrity Goals: Ulcer/skin breakdown will have a volume reduction of 80% by week 12 Date Initiated: 09/30/2017 Target Resolution Date: 01/16/2018 Goal Status: Active Interventions: Assess patient/caregiver ability to perform ulcer/skin care regimen upon admission and as needed Assess ulceration(s) every visit Notes: Electronic Signature(s) Signed: 10/15/2017 3:16:44 PM By: Alric Quan Entered By: Alric Quan on 10/14/2017 14:40:37 [name], [name] ([id number]) Romana Juniper, Linden M. ([id number]) -------------------------------------------------------------------------------- Pain Assessment Details Patient Name: [name]. Date of Service: 10/14/2017 2:15 PM Medical Record Number: [id number] Patient Account Number: [id number] Date of Birth/Sex: 03/04/1946 (72 y.o. Female) Treating RN: Carolyne Fiscal, Debi Primary Care Devona Holmes: Viviana Simpler Other Clinician: Referring Cerina Leary: Viviana Simpler Treating Kylie Gros/Extender: Tito Dine in Treatment: 2 Active Problems Location of Pain Severity and Description of Pain Patient Has Paino No Site Locations Pain Management and Medication Current Pain Management: Electronic Signature(s) Signed: 10/15/2017 3:16:44 PM By: Alric Quan Entered By: Alric Quan on 10/14/2017 14:18:47 [name], [name] ([id number]) -------------------------------------------------------------------------------- Patient/Caregiver Education Details Patient Name: [name]. Date of Service: 10/14/2017 2:15 PM Medical Record Number: [id number] Patient Account Number:  [id number] Date of Birth/Gender: Jun 18, 1946 (72 y.o. Female) Treating RN: Carolyne Fiscal, Debi Primary Care Physician: Viviana Simpler Other Clinician: Referring Physician: Viviana Simpler Treating Physician/Extender: Tito Dine in Treatment: 2 Education Assessment Education Provided To: Patient Education Topics Provided Wound/Skin Impairment: Handouts: Caring for Your Ulcer, Other: change dressings as ordered Methods: Demonstration, Explain/Verbal Responses: State content correctly Electronic Signature(s) Signed: 10/15/2017 3:16:44 PM By: Alric Quan Entered By: Alric Quan on 10/14/2017 15:04:55 [name], [name] ([id number]) -------------------------------------------------------------------------------- Wound  Assessment Details Patient Name: [name], AWAD. Date of Service: 10/14/2017 2:15 PM Medical Record Number: [id number] Patient Account Number: [id number] Date of Birth/Sex: 26-Oct-1945 (72 y.o. Female) Treating RN: Carolyne Fiscal, Debi Primary Care Adams Hinch: Viviana Simpler Other Clinician: Referring Cleburn Maiolo: Viviana Simpler Treating Carlas Vandyne/Extender: Tito Dine in Treatment: 2 Wound Status Wound Number: 1 Primary Etiology: Diabetic Wound/Ulcer of the Lower Extremity Wound Location: Right Lower Leg - Medial Secondary Trauma, Other Wounding Event: Trauma Etiology: Date Acquired: 08/30/2017 Wound Status: Open Weeks Of Treatment: 2 Comorbid Hypertension, Type II Diabetes, Clustered Wound: No History: Osteoarthritis, Neuropathy Photos Photo Uploaded By: Alric Quan on 10/14/2017 16:07:12 Wound Measurements Length: (cm) 2 Width: (cm) 2.6 Depth: (cm) 0.8 Area: (cm) 4.084 Volume: (cm) 3.267 % Reduction in Area: 50.5% % Reduction in Volume: -296% Epithelialization: None Tunneling: No Undermining: Yes Starting Position (o'clock): 6 Ending Position (o'clock): 12 Maximum Distance: (cm) 1.1 Wound Description Classification: Grade  2 Wound Margin: Distinct, outline attached Exudate Amount: Large Exudate Type: Serosanguineous Exudate Color: red, brown Foul Odor After Cleansing: Yes Due to Product Use: No Slough/Fibrino Yes Wound Bed Granulation Amount: Small (1-33%) Exposed Structure Granulation Quality: Red Fascia Exposed: No Necrotic Amount: Large (67-100%) Fat Layer (Subcutaneous Tissue) Exposed: Yes Necrotic Quality: Eschar, Adherent Slough Tendon Exposed: No Muscle Exposed: No [name], [name] ([id number]) Joint Exposed: No Bone Exposed: No Periwound Skin Texture Texture Color No Abnormalities Noted: No No Abnormalities Noted: No Callus: No Atrophie Blanche: No Crepitus: No Cyanosis: No Excoriation: Yes Ecchymosis: No Induration: Yes Erythema: Yes Rash: No Erythema Location: Circumferential Scarring: Yes Hemosiderin Staining: No Mottled: No Moisture Pallor: No No Abnormalities Noted: No Rubor: No Dry / Scaly: No Maceration: Yes Temperature / Pain Temperature: No Abnormality Tenderness on Palpation: Yes Wound Preparation Ulcer Cleansing: Rinsed/Irrigated with Saline Topical Anesthetic Applied: Other: lidocaine 4%, Treatment Notes Wound #1 (Right, Medial Lower Leg) 1. Cleansed with: Clean wound with Normal Saline 2. Anesthetic Topical Lidocaine 4% cream to wound bed prior to debridement 4. Dressing Applied: Other dressing (specify in notes) 5. Secondary Dressing Applied ABD Pad Dry Gauze Kerlix/Conform 7. Secured with Tape Notes xtrasorb, carboflex, unna to anchor, silvercel, kerlix, coban Electronic Signature(s) Signed: 10/15/2017 3:16:44 PM By: Alric Quan Entered By: Alric Quan on 10/14/2017 14:36:31 [name], [name] ([id number]) -------------------------------------------------------------------------------- Wound Assessment Details Patient Name: [name]. Date of Service: 10/14/2017 2:15 PM Medical Record Number: [id number] Patient Account Number:  [id number] Date of Birth/Sex: 1946-06-08 (72 y.o. Female) Treating RN: Carolyne Fiscal, Debi Primary Care Levi Klaiber: Viviana Simpler Other Clinician: Referring Ashika Apuzzo: Viviana Simpler Treating Kendalyn Cranfield/Extender: Tito Dine in Treatment: 2 Wound Status Wound Number: 2 Primary Etiology: Diabetic Wound/Ulcer of the Lower Extremity Wound Location: Left Lower Leg - Lateral Secondary Trauma, Other Wounding Event: Trauma Etiology: Date Acquired: 08/30/2017 Wound Status: Open Weeks Of Treatment: 2 Comorbid Hypertension, Type II Diabetes, Clustered Wound: No History: Osteoarthritis, Neuropathy Photos Photo Uploaded By: Alric Quan on 10/14/2017 16:07:13 Wound Measurements Length: (cm) 8 Width: (cm) 3.7 Depth: (cm) 0.8 Area: (cm) 23.248 Volume: (cm) 18.598 % Reduction in Area: -17.5% % Reduction in Volume: -34.2% Epithelialization: None Tunneling: Yes Position (o'clock): 6 Maximum Distance: (cm) 4.3 Undermining: No Wound Description Classification: Grade 2 Wound Margin: Distinct, outline attached Exudate Amount: Large Exudate Type: Serosanguineous Exudate Color: red, brown Foul Odor After Cleansing: Yes Due to Product Use: No Slough/Fibrino Yes Wound Bed Granulation Amount: Medium (34-66%) Exposed Structure Granulation Quality: Red, Hyper-granulation Fascia Exposed: No Necrotic Amount: Medium (34-66%) Fat Layer (Subcutaneous Tissue) Exposed: Yes Necrotic  Quality: Eschar, Adherent Slough Tendon Exposed: No Muscle Exposed: Yes Necrosis of Muscle: No NAHOMI, HEGNER. ([id number]) Joint Exposed: No Bone Exposed: No Periwound Skin Texture Texture Color No Abnormalities Noted: No No Abnormalities Noted: No Callus: No Atrophie Blanche: No Crepitus: No Cyanosis: No Excoriation: Yes Ecchymosis: No Induration: Yes Erythema: Yes Rash: No Erythema Location: Circumferential Scarring: Yes Hemosiderin Staining: No Mottled: No Moisture Pallor: No No  Abnormalities Noted: No Rubor: No Dry / Scaly: No Maceration: Yes Temperature / Pain Temperature: No Abnormality Tenderness on Palpation: Yes Wound Preparation Ulcer Cleansing: Rinsed/Irrigated with Saline Topical Anesthetic Applied: Other: lidocaine 4%, Treatment Notes Wound #2 (Left, Lateral Lower Leg) 1. Cleansed with: Clean wound with Normal Saline 2. Anesthetic Topical Lidocaine 4% cream to wound bed prior to debridement 4. Dressing Applied: Other dressing (specify in notes) 5. Secondary Dressing Applied ABD Pad Dry Gauze Kerlix/Conform 7. Secured with Tape Notes xtrasorb, carboflex, unna to anchor, silvercel, kerlix, coban Electronic Signature(s) Signed: 10/15/2017 3:16:44 PM By: Alric Quan Entered By: Alric Quan on 10/14/2017 14:32:47 [name], [name] ([id number]) -------------------------------------------------------------------------------- Vitals Details Patient Name: [name]. Date of Service: 10/14/2017 2:15 PM Medical Record Number: [id number] Patient Account Number: [id number] Date of Birth/Sex: 1946/08/13 (72 y.o. Female) Treating RN: Carolyne Fiscal, Debi Primary Care Annalicia Renfrew: Viviana Simpler Other Clinician: Referring Aliza Moret: Viviana Simpler Treating Alexys Gassett/Extender: Tito Dine in Treatment: 2 Vital Signs Time Taken: 14:18 Temperature (F): 98.6 Height (in): 61 Pulse (bpm): 97 Weight (lbs): 179.1 Respiratory Rate (breaths/min): 18 Body Mass Index (BMI): 33.8 Blood Pressure (mmHg): 139/69 Reference Range: 80 - 120 mg / dl Electronic Signature(s) Signed: 10/15/2017 3:16:44 PM By: Alric Quan Entered By: Alric Quan on 10/14/2017 14:21:37

## 2017-10-16 NOTE — Progress Notes (Signed)
ANYAE, GRIFFITH ([id number]) Visit Report for 10/14/2017 Debridement Details Patient Name: [name], [name]. Date of Service: 10/14/2017 2:15 PM Medical Record Number: [id number] Patient Account Number: [id number] Date of Birth/Sex: 21-Nov-1945 (72 y.o. Female) Treating RN: Carolyne Fiscal, Debi Primary Care Provider: Viviana Simpler Other Clinician: Referring Provider: Viviana Simpler Treating Provider/Extender: Tito Dine in Treatment: 2 Debridement Performed for Wound #1 Right,Medial Lower Leg Assessment: Performed By: Physician Ricard Dillon, MD Debridement: Debridement Severity of Tissue Pre Fat layer exposed Debridement: Pre-procedure Verification/Time Yes - 14:42 Out Taken: Start Time: 14:45 Pain Control: Lidocaine 4% Topical Solution Level: Skin/Subcutaneous Tissue Total Area Debrided (L x W): 2 (cm) x 2.6 (cm) = 5.2 (cm) Tissue and other material Viable, Non-Viable, Exudate, Fibrin/Slough, Subcutaneous debrided: Instrument: Curette Bleeding: Minimum Hemostasis Achieved: Pressure End Time: 14:47 Procedural Pain: 0 Post Procedural Pain: 0 Response to Treatment: Procedure was tolerated well Post Debridement Measurements of Total Wound Length: (cm) 2 Width: (cm) 2.6 Depth: (cm) 0.9 Volume: (cm) 3.676 Character of Wound/Ulcer Post Debridement: Requires Further Debridement Severity of Tissue Post Debridement: Fat layer exposed Post Procedure Diagnosis Same as Pre-procedure Electronic Signature(s) Signed: 10/14/2017 4:49:28 PM By: Linton Ham MD Signed: 10/15/2017 3:16:44 PM By: Alric Quan Entered By: Linton Ham on 10/14/2017 15:52:32 [name], [name] ([id number]) -------------------------------------------------------------------------------- Debridement Details Patient Name: [name]. Date of Service: 10/14/2017 2:15 PM Medical Record Number: [id number] Patient Account Number: [id number] Date of Birth/Sex: Aug 08, 1946 (72 y.o.  Female) Treating RN: Carolyne Fiscal, Debi Primary Care Provider: Viviana Simpler Other Clinician: Referring Provider: Viviana Simpler Treating Provider/Extender: Tito Dine in Treatment: 2 Debridement Performed for Wound #2 Left,Lateral Lower Leg Assessment: Performed By: Physician Ricard Dillon, MD Debridement: Debridement Severity of Tissue Pre Fat layer exposed Debridement: Pre-procedure Verification/Time Yes - 14:42 Out Taken: Start Time: 14:43 Pain Control: Lidocaine 4% Topical Solution Level: Skin/Subcutaneous Tissue Total Area Debrided (L x W): 8 (cm) x 3.7 (cm) = 29.6 (cm) Tissue and other material Viable, Non-Viable, Exudate, Fibrin/Slough, Subcutaneous debrided: Instrument: Curette Bleeding: Minimum Hemostasis Achieved: Pressure End Time: 14:45 Procedural Pain: 0 Post Procedural Pain: 0 Response to Treatment: Procedure was tolerated well Post Debridement Measurements of Total Wound Length: (cm) 8 Width: (cm) 3.7 Depth: (cm) 0.9 Volume: (cm) 20.923 Character of Wound/Ulcer Post Debridement: Requires Further Debridement Severity of Tissue Post Debridement: Fat layer exposed Post Procedure Diagnosis Same as Pre-procedure Electronic Signature(s) Signed: 10/14/2017 4:49:28 PM By: Linton Ham MD Signed: 10/15/2017 3:16:44 PM By: Alric Quan Entered By: Linton Ham on 10/14/2017 15:52:52 [name], [name] ([id number]) -------------------------------------------------------------------------------- HPI Details Patient Name: [name]. Date of Service: 10/14/2017 2:15 PM Medical Record Number: [id number] Patient Account Number: [id number] Date of Birth/Sex: May 06, 1946 (72 y.o. Female) Treating RN: Carolyne Fiscal, Debi Primary Care Provider: Viviana Simpler Other Clinician: Referring Provider: Viviana Simpler Treating Provider/Extender: Tito Dine in Treatment: 2 History of Present Illness HPI Description: 09/30/17; patient is  a 72 year old woman with type 2 diabetes and peripheral neuropathy. On 08/30/17 she fell down some steps. She developed redness and swelling and was seen in her primary physician's office on 09/11/17. She was felt to have cellulitis and given Keflex. She will return for follow-up on 09/18/17 noted to have an 8 x 4 cm contusion on the left lateral lower leg and a 3 x 3 cm contusion on the right medial lower leg. At follow-up on 09/29/16 she had a 4 x 4 centimeter necrotic surface and she's been referred here. The patient is not on blood thinners  she does not have a history of falling. She states her diabetes is under good control ABIs in this clinic could not be obtained because of pain. Patient does not have a prior wound history 10/07/17; the patient came back to the clinic last week for a nurse rewrap. Noted that the area on the right medial leg had opened as well. She is using silver alginate under compression. The substantial wound on the left anterior leg is malodorous today. I did a culture of the wound surface but no empiric antibiotics. The area on the right lateral leg is open with a small draining open area with gelatinous old blood. Using pickups and scalpel I remove necrotic surface from around this area over the entire area, pulse fairly substantially inferiorly. We've been using silver alginate to both wound areas 10/14/17; patient still has substantial drainage from these areas although they actually look some better. The area on the right lateral leg is opened up and there is depth of this. The area on the left leg is larger but more superficial. This area hasn't bridged area of skin that I thought I would have to removed however the granulation as come up and I'm wondering whether we can simply watch this for now these both were originally hematomas from trauma. CULTURE I did last week showed Escherichia coli and Pseudomonas both sensitive to ciprofloxacin. This was called in for her to  replace the doxycycline I prescribed empirically Electronic Signature(s) Signed: 10/14/2017 4:49:28 PM By: Linton Ham MD Entered By: Linton Ham on 10/14/2017 16:03:57 [name], [name] ([id number]) -------------------------------------------------------------------------------- Physical Exam Details Patient Name: [name]. Date of Service: 10/14/2017 2:15 PM Medical Record Number: [id number] Patient Account Number: [id number] Date of Birth/Sex: [birth year] (72 y.o. Female) Treating RN: Carolyne Fiscal, Debi Primary Care Provider: Viviana Simpler Other Clinician: Referring Provider: Viviana Simpler Treating Provider/Extender: Tito Dine in Treatment: 2 Constitutional Sitting or standing Blood Pressure is within target range for patient.. Pulse regular and within target range for patient.Marland Kitchen Respirations regular, non-labored and within target range.. Temperature is normal and within the target range for the patient.Marland Kitchen appears in no distress. Notes Wound exam; the more substantial areas on the left lateral leg. She still requires debridement of necrotic surface debris although the granulation tissue here looks better. Used to #5 curet hemostasis with direct pressure It also appears to be filling in both holes and I wonder whether the bridged Y skin will ultimately be viable. Therefore I did not remove this oThe area on the right medial leg also was debrided with a #5 curet. Necrotic surface debris hemostasis with direct pressure Electronic Signature(s) Signed: 10/14/2017 4:49:28 PM By: Linton Ham MD Entered By: Linton Ham on 10/14/2017 15:58:11 [name], [name] ([id number]) -------------------------------------------------------------------------------- Physician Orders Details Patient Name: [name]. Date of Service: 10/14/2017 2:15 PM Medical Record Number: [id number] Patient Account Number: [id number] Date of Birth/Sex: 06-Jun-1946 (72 y.o. Female) Treating  RN: Carolyne Fiscal, Debi Primary Care Provider: Viviana Simpler Other Clinician: Referring Provider: Viviana Simpler Treating Provider/Extender: Tito Dine in Treatment: 2 Verbal / Phone Orders: Yes Clinician: Pinkerton, Debi Read Back and Verified: Yes Diagnosis Coding Wound Cleansing Wound #1 Right,Medial Lower Leg o Clean wound with Normal Saline. o Cleanse wound with mild soap and water Wound #2 Left,Lateral Lower Leg o Clean wound with Normal Saline. o Cleanse wound with mild soap and water Anesthetic (add to Medication List) Wound #1 Right,Medial Lower Leg o Topical Lidocaine 4% cream applied to wound bed prior  to debridement (In Clinic Only). Wound #2 Left,Lateral Lower Leg o Topical Lidocaine 4% cream applied to wound bed prior to debridement (In Clinic Only). Primary Wound Dressing Wound #1 Right,Medial Lower Leg o Silvercel Non-Adherent Wound #2 Left,Lateral Lower Leg o Silvercel Non-Adherent Secondary Dressing Wound #1 Right,Medial Lower Leg o ABD pad o Dry Gauze o XtraSorb o Other - charcoal Wound #2 Left,Lateral Lower Leg o ABD pad o Dry Gauze o XtraSorb o Other - charcoal Dressing Change Frequency Wound #1 Right,Medial Lower Leg o Other: - twice a week Wound #2 Left,Lateral Lower Leg o Other: - twice a week ZAAKIRAH, KISTNER. ([id number]) Follow-up Appointments Wound #1 Right,Medial Lower Leg o Return Appointment in 1 week. o Nurse Visit as needed Wound #2 Left,Lateral Lower Leg o Return Appointment in 1 week. o Nurse Visit as needed Edema Control Wound #1 Right,Medial Lower Leg o Kerlix and Coban - Bilateral - unna to anchor o Elevate legs to the level of the heart and pump ankles as often as possible Wound #2 Left,Lateral Lower Leg o Kerlix and Coban - Bilateral - unna to anchor o Elevate legs to the level of the heart and pump ankles as often as possible Additional Orders /  Instructions Wound #1 Right,Medial Lower Leg o Increase protein intake. o Other: - Vitamin C, Zinc Wound #2 Left,Lateral Lower Leg o Increase protein intake. o Other: - Vitamin C, Zinc Patient Medications Allergies: NKDA Notifications Medication Indication Start End lidocaine DOSE 1 - topical 4 % cream - 1 cream topical Electronic Signature(s) Signed: 10/14/2017 4:49:28 PM By: Linton Ham MD Signed: 10/15/2017 3:16:44 PM By: Alric Quan Entered By: Alric Quan on 10/14/2017 14:47:25 [name], [name] ([id number]) -------------------------------------------------------------------------------- Prescription 10/14/2017 Patient Name: [name]. Provider: Ricard Dillon MD Date of Birth: 11/01/1945 NPI#: [id number] Sex: F DEA#: [id number] Phone #: [phone] License #: [id number] Patient Address: [address], [place], [place] [address] [address], [place] [address] [phone] Allergies NKDA Medication Medication: Route: Strength: Form: lidocaine topical 4% cream Class: TOPICAL LOCAL ANESTHETICS Dose: Frequency / Time: Indication: 1 1 cream topical Number of Refills: Number of Units: 0 Generic Substitution: Start Date: End Date: Administered at [place]: Yes Time Administered: Time Discontinued: Note to Pharmacy: Signature(s): Date(s): Electronic Signature(s) Signed: 10/14/2017 4:49:28 PM By: Linton Ham MD Signed: 10/15/2017 3:16:44 PM By: Alric Quan Entered By: Alric Quan on 10/14/2017 14:47:27 [name], [name] ([id number]) [name], [name] ([id number]) --------------------------------------------------------------------------------  Problem List Details Patient Name: [name]. Date of Service: 10/14/2017 2:15 PM Medical Record Number: [id number] Patient Account Number:  [id number] Date of Birth/Sex: 1946/06/28 (72 y.o. 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 09/30/2017 Yes L97.218 Non-pressure chronic ulcer of right calf with other specified severity 09/30/2017 Yes E11.40 Type 2 diabetes mellitus with diabetic neuropathy, unspecified 09/30/2017 Yes I87.331 Chronic venous hypertension (idiopathic) with ulcer and 10/07/2017 Yes inflammation of right lower extremity Inactive Problems Resolved Problems Electronic Signature(s) Signed: 10/14/2017 4:49:28 PM By: Linton Ham MD Entered By: Linton Ham on 10/14/2017 15:52:01 [name], [name] ([id number]) -------------------------------------------------------------------------------- Progress Note Details Patient Name: [name]. Date of Service: 10/14/2017 2:15 PM Medical Record Number: [id number] Patient Account Number: [id number] Date  of Birth/Sex: Nov 28, 1945 (72 y.o. Female) Treating RN: Carolyne Fiscal, Debi Primary Care Provider: Viviana Simpler Other Clinician: Referring Provider: Viviana Simpler Treating Provider/Extender: Tito Dine in Treatment: 2 Subjective History of Present Illness (HPI) 09/30/17; patient is a 72 year old woman with type 2 diabetes and peripheral neuropathy. On 08/30/17 she fell down some steps. She developed redness and swelling and was seen in her primary physician's office on 09/11/17. She was felt to have cellulitis and given Keflex. She will return for follow-up on 09/18/17 noted to have an 8 x 4 cm contusion on the left lateral lower leg and a 3 x 3 cm contusion on the right medial lower leg. At follow-up on 09/29/16 she had a 4 x 4 centimeter necrotic surface and  she's been referred here. The patient is not on blood thinners she does not have a history of falling. She states her diabetes is under good control ABIs in this clinic could not be obtained because of pain. Patient does not have a prior wound history 10/07/17; the patient came back to the clinic last week for a nurse rewrap. Noted that the area on the right medial leg had opened as well. She is using silver alginate under compression. The substantial wound on the left anterior leg is malodorous today. I did a culture of the wound surface but no empiric antibiotics. The area on the right lateral leg is open with a small draining open area with gelatinous old blood. Using pickups and scalpel I remove necrotic surface from around this area over the entire area, pulse fairly substantially inferiorly. We've been using silver alginate to both wound areas 10/14/17; patient still has substantial drainage from these areas although they actually look some better. The area on the right lateral leg is opened up and there is depth of this. The area on the left leg is larger but more superficial. This area hasn't bridged area of skin that I thought I would have to removed however the granulation as come up and I'm wondering whether we can simply watch this for now these both were originally hematomas from trauma. CULTURE I did last week showed Escherichia coli and Pseudomonas both sensitive to ciprofloxacin. This was called in for her to replace the doxycycline I prescribed empirically Objective Constitutional Sitting or standing Blood Pressure is within target range for patient.. Pulse regular and within target range for patient.Marland Kitchen Respirations regular, non-labored and within target range.. Temperature is normal and within the target range for the patient.Marland Kitchen appears in no distress. Vitals Time Taken: 2:18 PM, Height: 61 in, Weight: 179.1 lbs, BMI: 33.8, Temperature: 98.6 F, Pulse: 97 bpm, Respiratory Rate: 18  breaths/min, Blood Pressure: 139/69 mmHg. General Notes: Wound exam; the more substantial areas on the left lateral leg. She still requires debridement of necrotic surface debris although the granulation tissue here looks better. Used to #5 curet hemostasis with direct pressure It also appears to be filling in both holes and I wonder whether the bridged Y skin will ultimately be viable. Therefore I did not remove [facility], Amayrani M. ([id number]) this The area on the right medial leg also was debrided with a #5 curet. Necrotic surface debris hemostasis with direct pressure Integumentary (Hair, Skin) Wound #1 status is Open. Original cause of wound was Trauma. The wound is located on the Right,Medial Lower Leg. The wound measures 2cm length x 2.6cm width x 0.8cm depth; 4.084cm^2 area and 3.267cm^3 volume. There is Fat Layer (Subcutaneous Tissue) Exposed exposed. There is no tunneling noted,  however, there is undermining starting at 6:00 and ending at 12:00 with a maximum distance of 1.1cm. There is a large amount of serosanguineous drainage noted. Foul odor after cleansing was noted. The wound margin is distinct with the outline attached to the wound base. There is small (1-33%) red granulation within the wound bed. There is a large (67-100%) amount of necrotic tissue within the wound bed including Eschar and Adherent Slough. The periwound skin appearance exhibited: Excoriation, Induration, Scarring, Maceration, Erythema. The periwound skin appearance did not exhibit: Callus, Crepitus, Rash, Dry/Scaly, Atrophie Blanche, Cyanosis, Ecchymosis, Hemosiderin Staining, Mottled, Pallor, Rubor. The surrounding wound skin color is noted with erythema which is circumferential. Periwound temperature was noted as No Abnormality. The periwound has tenderness on palpation. Wound #2 status is Open. Original cause of wound was Trauma. The wound is located on the Left,Lateral Lower Leg. The wound measures 8cm length x  3.7cm width x 0.8cm depth; 23.248cm^2 area and 18.598cm^3 volume. There is muscle and Fat Layer (Subcutaneous Tissue) Exposed exposed. There is no undermining noted, however, there is tunneling at 6:00 with a maximum distance of 4.3cm. There is a large amount of serosanguineous drainage noted. Foul odor after cleansing was noted. The wound margin is distinct with the outline attached to the wound base. There is medium (34-66%) red, hyper - granulation within the wound bed. There is a medium (34-66%) amount of necrotic tissue within the wound bed including Eschar and Adherent Slough. The periwound skin appearance exhibited: Excoriation, Induration, Scarring, Maceration, Erythema. The periwound skin appearance did not exhibit: Callus, Crepitus, Rash, Dry/Scaly, Atrophie Blanche, Cyanosis, Ecchymosis, Hemosiderin Staining, Mottled, Pallor, Rubor. The surrounding wound skin color is noted with erythema which is circumferential. Periwound temperature was noted as No Abnormality. The periwound has tenderness on palpation. 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 There was a Skin/Subcutaneous Tissue Debridement (11042- 11047) debridement with total area of 5.2 sq cm performed by Ricard Dillon, MD. with the following instrument(s): Curette to remove Viable and Non-Viable tissue/material including Exudate, Fibrin/Slough, and Subcutaneous after achieving pain control using Lidocaine  4% Topical Solution. A time out was conducted at 14:42, prior to the start of the procedure. A Minimum amount of bleeding was controlled with Pressure. The procedure was tolerated well with a pain level of 0 throughout and a pain level of 0 following the procedure. Post Debridement Measurements: 2cm length x 2.6cm width x 0.9cm depth; 3.676cm^3 volume. Character of Wound/Ulcer Post Debridement requires further debridement. Severity of Tissue Post Debridement is: Fat layer exposed. Post procedure Diagnosis Wound #1: Same as Pre-Procedure [name], [name]. ([id number]) Wound #2 Pre-procedure diagnosis of Wound #2 is a Diabetic Wound/Ulcer of the Lower Extremity located on the Left,Lateral Lower Leg .Severity of Tissue Pre Debridement is: Fat layer exposed. There was a Skin/Subcutaneous Tissue Debridement (11042- 11047) debridement with total area of 29.6 sq cm performed by Ricard Dillon, MD. with the following instrument(s): Curette to remove Viable and Non-Viable tissue/material including Exudate, Fibrin/Slough, and Subcutaneous after achieving pain control using Lidocaine 4% Topical Solution. A  time out was conducted at 14:42, prior to the start of the procedure. A Minimum amount of bleeding was controlled with Pressure. The procedure was tolerated well with a pain level of 0 throughout and a pain level of 0 following the procedure. Post Debridement Measurements: 8cm length x 3.7cm width x 0.9cm depth; 20.923cm^3 volume. Character of Wound/Ulcer Post Debridement requires further debridement. Severity of Tissue Post Debridement is: Fat layer exposed. Post procedure Diagnosis Wound #2: Same as Pre-Procedure Plan Wound Cleansing: Wound #1 Right,Medial Lower Leg: Clean wound with Normal Saline. Cleanse wound with mild soap and water Wound #2 Left,Lateral Lower Leg: Clean wound with Normal Saline. Cleanse wound with mild soap and water Anesthetic (add to Medication List): Wound #1  Right,Medial Lower Leg: Topical Lidocaine 4% cream applied to wound bed prior to debridement (In Clinic Only). Wound #2 Left,Lateral Lower Leg: Topical Lidocaine 4% cream applied to wound bed prior to debridement (In Clinic Only). Primary Wound Dressing: Wound #1 Right,Medial Lower Leg: Silvercel Non-Adherent Wound #2 Left,Lateral Lower Leg: Silvercel Non-Adherent Secondary Dressing: Wound #1 Right,Medial Lower Leg: ABD pad Dry Gauze XtraSorb Other - charcoal Wound #2 Left,Lateral Lower Leg: ABD pad Dry Gauze XtraSorb Other - charcoal Dressing Change Frequency: Wound #1 Right,Medial Lower Leg: Other: - twice a week Wound #2 Left,Lateral Lower Leg: Other: - twice a week Follow-up Appointments: Wound #1 Right,Medial Lower Leg: Return Appointment in 1 week. Nurse Visit as needed TITILAYO, HAGANS ([id number]) Wound #2 Left,Lateral Lower Leg: Return Appointment in 1 week. Nurse Visit as needed Edema Control: Wound #1 Right,Medial Lower Leg: Kerlix and Coban - Bilateral - unna to anchor Elevate legs to the level of the heart and pump ankles as often as possible Wound #2 Left,Lateral Lower Leg: Kerlix and Coban - Bilateral - unna to anchor Elevate legs to the level of the heart and pump ankles as often as possible Additional Orders / Instructions: Wound #1 Right,Medial Lower Leg: Increase protein intake. Other: - Vitamin C, Zinc Wound #2 Left,Lateral Lower Leg: Increase protein intake. Other: - Vitamin C, Zinc The following medication(s) was prescribed: lidocaine topical 4 % cream 1 1 cream topical was prescribed at facility #1 continuue silver alginate based dressings/ABDs/Kerlix and Coban #2 both wound areas actually looks some better although still requiring debridement #3 she is still having to come in for a nurse visit because of drainage for a dressing change A Jackelyn Poling this is Mrs. Simeon Craft stitches coming in for a dressing change on Friday Electronic  Signature(s) Signed: 10/14/2017 4:05:08 PM By: Linton Ham MD Entered By: Linton Ham on 10/14/2017 16:05:08 Skidgel, [name] ([id number]) -------------------------------------------------------------------------------- [place] Details Patient Name: [name]. Date of Service: 10/14/2017 Medical Record Number: [id number] Patient Account Number: [id number] Date of Birth/Sex: 29-Nov-1945 (72 y.o. Female) Treating RN: Carolyne Fiscal, Debi Primary Care Provider: Viviana Simpler Other Clinician: Referring Provider: Viviana Simpler Treating Provider/Extender: Tito Dine in Treatment: 2 Diagnosis Coding ICD-10 Codes Code Description [phone] 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e L97.218 Non-pressure chronic ulcer of right calf with other specified severity E11.40 Type 2 diabetes mellitus with diabetic neuropathy, unspecified I87.331 Chronic venous hypertension (idiopathic) with ulcer and inflammation of right lower extremity Facility Procedures CPT4 Code: [id number] Description: 27253 - DEB SUBQ TISSUE 20 SQ CM/< ICD-10 Diagnosis Description 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 Quantity: 1 CPT4 Code: 66440347 Description: [place]  TISS EA ADDL 20CM ICD-10 Diagnosis Description 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 Quantity: 1 Physician Procedures CPT4 Code: 4037543 Description: 60677 - WC PHYS SUBQ TISS 20 SQ CM ICD-10 Diagnosis Description 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

## 2017-10-19 NOTE — Progress Notes (Signed)
TERE, MCCONAUGHEY [place] ([id number]) Visit Report for 10/16/2017 Arrival Information Details Patient Name: [name], [name]. Date of Service: 10/16/2017 2:30 PM Medical Record Number: [id number] Patient Account Number: [id number] Date of Birth/Sex: 02-27-1946 (72 y.o. Female) Treating RN: Cornell Barman Primary Care Monette Omara: Viviana Simpler Other Clinician: Referring Anaka Beazer: Viviana Simpler Treating Daniele Dillow/Extender: Melburn Hake, HOYT Weeks in Treatment: 2 Visit Information History Since Last Visit Added or deleted any medications: No Patient Arrived: Ambulatory Any new allergies or adverse reactions: No Arrival Time: 14:50 Had a fall or experienced change in No Accompanied By: self activities of daily living that may affect Transfer Assistance: None risk of falls: Patient Identification Verified: Yes Signs or symptoms of abuse/neglect since last visito No Secondary Verification Process Completed: Yes Hospitalized since last visit: No Patient Requires Transmission-Based No Has Dressing in Place as Prescribed: Yes Precautions: Pain Present Now: No Patient Has Alerts: Yes Patient Alerts: DM II Electronic Signature(s) Signed: 10/19/2017 11:05:32 AM By: Gretta Cool, BSN, RN, CWS, Kim RN, BSN Entered By: Gretta Cool, BSN, RN, CWS, Kim on 10/16/2017 15:16:04 Wessinger, [name] ([id number]) -------------------------------------------------------------------------------- Clinic Level of Care Assessment Details Patient Name: [name]. Date of Service: 10/16/2017 2:30 PM Medical Record Number: [id number] Patient Account Number: [id number] Date of Birth/Sex: 09-22-1945 (72 y.o. Female) Treating RN: Cornell Barman Primary Care Earlene Bjelland: Viviana Simpler Other Clinician: Referring Aideen Fenster: Viviana Simpler Treating Faatima Tench/Extender: Melburn Hake, HOYT Weeks in Treatment: 2 Clinic Level of Care Assessment Items TOOL 4 Quantity Score []  - Use when only an EandM is performed on FOLLOW-UP visit 0 ASSESSMENTS -  Nursing Assessment / Reassessment []  - Reassessment of Co-morbidities (includes updates in patient status) 0 []  - 0 Reassessment of Adherence to Treatment Plan ASSESSMENTS - Wound and Skin Assessment / Reassessment X - Simple Wound Assessment / Reassessment - one wound 1 5 []  - 0 Complex Wound Assessment / Reassessment - multiple wounds []  - 0 Dermatologic / Skin Assessment (not related to wound area) ASSESSMENTS - Focused Assessment []  - Circumferential Edema Measurements - multi extremities 0 []  - 0 Nutritional Assessment / Counseling / Intervention []  - 0 Lower Extremity Assessment (monofilament, tuning fork, pulses) []  - 0 Peripheral Arterial Disease Assessment (using hand held doppler) ASSESSMENTS - Ostomy and/or Continence Assessment and Care []  - Incontinence Assessment and Management 0 []  - 0 Ostomy Care Assessment and Management (repouching, etc.) PROCESS - Coordination of Care X - Simple Patient / Family Education for ongoing care 1 15 []  - 0 Complex (extensive) Patient / Family Education for ongoing care []  - 0 Staff obtains Programmer, systems, Records, Test Results / Process Orders []  - 0 Staff telephones HHA, Nursing Homes / Clarify orders / etc []  - 0 Routine Transfer to another Facility (non-emergent condition) []  - 0 Routine Hospital Admission (non-emergent condition) []  - 0 New Admissions / Biomedical engineer / Ordering NPWT, Apligraf, etc. []  - 0 Emergency Hospital Admission (emergent condition) X- 1 10 Simple Discharge Coordination ARCOLA, FRESHOUR M. ([id number]) []  - 0 Complex (extensive) Discharge Coordination PROCESS - Special Needs []  - Pediatric / Minor Patient Management 0 []  - 0 Isolation Patient Management []  - 0 Hearing / Language / Visual special needs []  - 0 Assessment of Community assistance (transportation, D/C planning, etc.) []  - 0 Additional assistance / Altered mentation []  - 0 Support Surface(s) Assessment (bed, cushion, seat,  etc.) INTERVENTIONS - Wound Cleansing / Measurement []  - Simple Wound Cleansing - one wound 0 X- 2 5 Complex Wound Cleansing - multiple wounds X- 1 5 Wound Imaging (  photographs - any number of wounds) []  - 0 Wound Tracing (instead of photographs) []  - 0 Simple Wound Measurement - one wound X- 2 5 Complex Wound Measurement - multiple wounds INTERVENTIONS - Wound Dressings []  - Small Wound Dressing one or multiple wounds 0 []  - 0 Medium Wound Dressing one or multiple wounds X- 2 20 Large Wound Dressing one or multiple wounds []  - 0 Application of Medications - topical []  - 0 Application of Medications - injection INTERVENTIONS - Miscellaneous []  - External ear exam 0 []  - 0 Specimen Collection (cultures, biopsies, blood, body fluids, etc.) []  - 0 Specimen(s) / Culture(s) sent or taken to Lab for analysis []  - 0 Patient Transfer (multiple staff / Civil Service fast streamer / Similar devices) []  - 0 Simple Staple / Suture removal (25 or less) []  - 0 Complex Staple / Suture removal (26 or more) []  - 0 Hypo / Hyperglycemic Management (close monitor of Blood Glucose) []  - 0 Ankle / Brachial Index (ABI) - do not check if billed separately []  - 0 Vital Signs Maceachern, HALLI EQUIHUA ([id number]) Has the patient been seen at the hospital within the last three years: Yes Total Score: 95 Level Of Care: New/Established - Level 3 Electronic Signature(s) Signed: 10/19/2017 11:05:32 AM By: Gretta Cool, BSN, RN, CWS, Kim RN, BSN Entered By: Gretta Cool, BSN, RN, CWS, Kim on 10/19/2017 10:44:58 Ledesma, [name] ([id number]) -------------------------------------------------------------------------------- Encounter Discharge Information Details Patient Name: [name]. Date of Service: 10/16/2017 2:30 PM Medical Record Number: [id number] Patient Account Number: [id number] Date of Birth/Sex: 06-18-1946 (72 y.o. Female) Treating RN: Cornell Barman Primary Care Oliviah Agostini: Viviana Simpler Other Clinician: Referring  Jailee Jaquez: Viviana Simpler Treating Robby Pirani/Extender: Melburn Hake, HOYT Weeks in Treatment: 2 Encounter Discharge Information Items Discharge Pain Level: 0 Discharge Condition: Stable Ambulatory Status: Ambulatory Discharge Destination: Home Private Transportation: Auto Accompanied By: self Schedule Follow-up Appointment: Yes Medication Reconciliation completed and Yes provided to Patient/Care Itzamara Casas: Clinical Summary of Care: Electronic Signature(s) Signed: 10/19/2017 10:42:09 AM By: Gretta Cool, BSN, RN, CWS, Kim RN, BSN Entered By: Gretta Cool, BSN, RN, CWS, Kim on 10/19/2017 10:42:09 [name] ([id number]) -------------------------------------------------------------------------------- Patient/Caregiver Education Details Patient Name: [name]. Date of Service: 10/16/2017 2:30 PM Medical Record Number: [id number] Patient Account Number: [id number] Date of Birth/Gender: October 26, 1945 (72 y.o. Female) Treating RN: Cornell Barman Primary Care Physician: Viviana Simpler Other Clinician: Referring Physician: Viviana Simpler Treating Physician/Extender: Sharalyn Ink in Treatment: 2 Education Assessment Education Provided To: Patient Education Topics Provided Wound/Skin Impairment: Handouts: Caring for Your Ulcer Methods: Demonstration, Explain/Verbal Responses: State content correctly Electronic Signature(s) Signed: 10/19/2017 11:05:32 AM By: Gretta Cool, BSN, RN, CWS, Kim RN, BSN Entered By: Gretta Cool, BSN, RN, CWS, Kim on 10/19/2017 10:41:56 [name] ([id number]) -------------------------------------------------------------------------------- Wound Assessment Details Patient Name: [name]. Date of Service: 10/16/2017 2:30 PM Medical Record Number: [id number] Patient Account Number: [id number] Date of Birth/Sex: September 14, 1945 (72 y.o. Female) Treating RN: Cornell Barman Primary Care Hymen Arnett: Viviana Simpler Other Clinician: Referring Jael Waldorf: Viviana Simpler Treating  Gid Schoffstall/Extender: Melburn Hake, HOYT Weeks in Treatment: 2 Wound Status Wound Number: 1 Primary Etiology: Diabetic Wound/Ulcer of the Lower Extremity Wound Location: Right, Medial Lower Leg Secondary Trauma, Other Wounding Event: Trauma Etiology: Date Acquired: 08/30/2017 Wound Status: Open Weeks Of Treatment: 2 Clustered Wound: No Wound Measurements Length: (cm) 2 Width: (cm) 2.4 Depth: (cm) 0.8 Area: (cm) 3.77 Volume: (cm) 3.016 % Reduction in Area: 54.3% % Reduction in Volume: -265.6% Wound Description Classification: Grade 2 Periwound Skin Texture Texture Color No Abnormalities  Noted: No No Abnormalities Noted: No Moisture No Abnormalities Noted: No Treatment Notes Wound #1 (Right, Medial Lower Leg) 1. Cleansed with: Clean wound with Normal Saline 2. Anesthetic Topical Lidocaine 4% cream to wound bed prior to debridement 4. Dressing Applied: Other dressing (specify in notes) 5. Secondary Dressing Applied ABD Pad Dry Gauze Kerlix/Conform 7. Secured with Tape Notes xtrasorb, carboflex, unna to anchor, silvercel, kerlix, Event organiser) Signed: 10/19/2017 11:05:32 AM By: Gretta Cool, BSN, RN, CWS, Kim RN, BSN Entered By: Gretta Cool, BSN, RN, CWS, Kim on 10/16/2017 15:16:49 [place], [name] ([id number]) [address] [place] Jerilynn Mages ([id number]) -------------------------------------------------------------------------------- Wound Assessment Details Patient Name: [name]. Date of Service: 10/16/2017 2:30 PM Medical Record Number: [id number] Patient Account Number: [id number] Date of Birth/Sex: Feb 02, 1946 (72 y.o. Female) Treating RN: Cornell Barman Primary Care Lamira Borin: Viviana Simpler Other Clinician: Referring Keian Odriscoll: Viviana Simpler Treating Exavior Kimmons/Extender: Melburn Hake, HOYT Weeks in Treatment: 2 Wound Status Wound Number: 2 Primary Etiology: Diabetic Wound/Ulcer of the Lower Extremity Wound Location: Left, Lateral Lower Leg Secondary Trauma,  Other Wounding Event: Trauma Etiology: Date Acquired: 08/30/2017 Wound Status: Open Weeks Of Treatment: 2 Clustered Wound: No Wound Measurements Length: (cm) 8 Width: (cm) 4 Depth: (cm) 0.8 Area: (cm) 25.133 Volume: (cm) 20.106 % Reduction in Area: -27% % Reduction in Volume: -45.1% Wound Description Classification: Grade 2 Periwound Skin Texture Texture Color No Abnormalities Noted: No No Abnormalities Noted: No Moisture No Abnormalities Noted: No Treatment Notes Wound #2 (Left, Lateral Lower Leg) 1. Cleansed with: Clean wound with Normal Saline 2. Anesthetic Topical Lidocaine 4% cream to wound bed prior to debridement 4. Dressing Applied: Other dressing (specify in notes) 5. Secondary Dressing Applied ABD Pad Dry Gauze Kerlix/Conform 7. Secured with Tape Notes xtrasorb, carboflex, unna to anchor, silvercel, kerlix, Event organiser) Signed: 10/19/2017 11:05:32 AM By: Gretta Cool, BSN, RN, CWS, Kim RN, BSN Entered By: Gretta Cool, BSN, RN, CWS, Kim on 10/16/2017 15:16:49 Hargraves, [name] ([id number])

## 2017-10-21 ENCOUNTER — Encounter: Payer: Medicare Other | Admitting: Internal Medicine

## 2017-10-21 DIAGNOSIS — E11622 Type 2 diabetes mellitus with other skin ulcer: Secondary | ICD-10-CM | POA: Diagnosis not present

## 2017-10-21 DIAGNOSIS — I87331 Chronic venous hypertension (idiopathic) with ulcer and inflammation of right lower extremity: Secondary | ICD-10-CM | POA: Diagnosis not present

## 2017-10-21 DIAGNOSIS — S81802A Unspecified open wound, left lower leg, initial encounter: Secondary | ICD-10-CM | POA: Diagnosis not present

## 2017-10-21 DIAGNOSIS — L97218 Non-pressure chronic ulcer of right calf with other specified severity: Secondary | ICD-10-CM | POA: Diagnosis not present

## 2017-10-21 DIAGNOSIS — I10 Essential (primary) hypertension: Secondary | ICD-10-CM | POA: Diagnosis not present

## 2017-10-21 DIAGNOSIS — L97223 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e: Secondary | ICD-10-CM | POA: Diagnosis not present

## 2017-10-21 DIAGNOSIS — E1140 Type 2 diabetes mellitus with diabetic neuropathy, unspecified: Secondary | ICD-10-CM | POA: Diagnosis not present

## 2017-10-21 DIAGNOSIS — E1151 Type 2 diabetes mellitus with diabetic peripheral angiopathy without gangrene: Secondary | ICD-10-CM | POA: Diagnosis not present

## 2017-10-21 DIAGNOSIS — S81801A Unspecified open wound, right lower leg, initial encounter: Secondary | ICD-10-CM | POA: Diagnosis not present

## 2017-10-23 ENCOUNTER — Encounter: Payer: Self-pay | Admitting: Internal Medicine

## 2017-10-23 DIAGNOSIS — F39 Unspecified mood [affective] disorder: Secondary | ICD-10-CM

## 2017-10-25 NOTE — Progress Notes (Signed)
[name] ([id number]) Visit Report for 10/21/2017 Arrival Information Details Patient Name: [name], [name]. Date of Service: 10/21/2017 1:30 PM Medical Record Number: [id number] Patient Account Number: [id number] Date of Birth/Sex: 1946-08-26 (72 y.o. Female) Treating RN: Ahmed Prima Primary Care Yianna Tersigni: Viviana Simpler Other Clinician: Referring Andrews Tener: Viviana Simpler Treating Meklit Cotta/Extender: Tito Dine in Treatment: 3 Visit Information History Since Last Visit All ordered tests and consults were completed: No Patient Arrived: Ambulatory Added or deleted any medications: No Arrival Time: 13:57 Any new allergies or adverse reactions: No Accompanied By: self Had a fall or experienced change in No Transfer Assistance: None activities of daily living that may affect Patient Identification Verified: Yes risk of falls: Secondary Verification Process Completed: Yes Signs or symptoms of abuse/neglect since last visito No Patient Requires Transmission-Based No Hospitalized since last visit: No Precautions: Has Dressing in Place as Prescribed: Yes Patient Has Alerts: Yes Has Compression in Place as Prescribed: Yes Patient Alerts: DM II Pain Present Now: No Electronic Signature(s) Signed: 10/23/2017 4:55:35 PM By: Alric Quan Entered By: Alric Quan on 10/21/2017 13:57:50 Ellner, [name] ([id number]) -------------------------------------------------------------------------------- Clinic Level of Care Assessment Details Patient Name: [name]. Date of Service: 10/21/2017 1:30 PM Medical Record Number: [id number] Patient Account Number: [id number] Date of Birth/Sex: 02-Oct-1945 (72 y.o. Female) Treating RN: Carolyne Fiscal, Debi Primary Care Aveen Stansel: Viviana Simpler Other Clinician: Referring Alfredo Collymore: Viviana Simpler Treating Gwendalynn Eckstrom/Extender: Tito Dine in Treatment: 3 Clinic Level of Care Assessment Items TOOL 4 Quantity  Score X - Use when only an EandM is performed on FOLLOW-UP visit 1 0 ASSESSMENTS - Nursing Assessment / Reassessment X - Reassessment of Co-morbidities (includes updates in patient status) 1 10 X- 1 5 Reassessment of Adherence to Treatment Plan ASSESSMENTS - Wound and Skin Assessment / Reassessment []  - Simple Wound Assessment / Reassessment - one wound 0 X- 2 5 Complex Wound Assessment / Reassessment - multiple wounds []  - 0 Dermatologic / Skin Assessment (not related to wound area) ASSESSMENTS - Focused Assessment []  - Circumferential Edema Measurements - multi extremities 0 []  - 0 Nutritional Assessment / Counseling / Intervention []  - 0 Lower Extremity Assessment (monofilament, tuning fork, pulses) []  - 0 Peripheral Arterial Disease Assessment (using hand held doppler) ASSESSMENTS - Ostomy and/or Continence Assessment and Care []  - Incontinence Assessment and Management 0 []  - 0 Ostomy Care Assessment and Management (repouching, etc.) PROCESS - Coordination of Care X - Simple Patient / Family Education for ongoing care 1 15 []  - 0 Complex (extensive) Patient / Family Education for ongoing care []  - 0 Staff obtains Programmer, systems, Records, Test Results / Process Orders []  - 0 Staff telephones HHA, Nursing Homes / Clarify orders / etc []  - 0 Routine Transfer to another Facility (non-emergent condition) []  - 0 Routine Hospital Admission (non-emergent condition) []  - 0 New Admissions / Biomedical engineer / Ordering NPWT, Apligraf, etc. []  - 0 Emergency Hospital Admission (emergent condition) X- 1 10 Simple Discharge Coordination ALYZA, ARTIAGA M. ([id number]) []  - 0 Complex (extensive) Discharge Coordination PROCESS - Special Needs []  - Pediatric / Minor Patient Management 0 []  - 0 Isolation Patient Management []  - 0 Hearing / Language / Visual special needs []  - 0 Assessment of Community assistance (transportation, D/C planning, etc.) []  - 0 Additional assistance  / Altered mentation []  - 0 Support Surface(s) Assessment (bed, cushion, seat, etc.) INTERVENTIONS - Wound Cleansing / Measurement []  - Simple Wound Cleansing - one wound 0 X- 2 5 Complex Wound Cleansing -  multiple wounds X- 1 5 Wound Imaging (photographs - any number of wounds) []  - 0 Wound Tracing (instead of photographs) []  - 0 Simple Wound Measurement - one wound X- 2 5 Complex Wound Measurement - multiple wounds INTERVENTIONS - Wound Dressings []  - Small Wound Dressing one or multiple wounds 0 []  - 0 Medium Wound Dressing one or multiple wounds X- 2 20 Large Wound Dressing one or multiple wounds X- 1 5 Application of Medications - topical []  - 0 Application of Medications - injection INTERVENTIONS - Miscellaneous []  - External ear exam 0 []  - 0 Specimen Collection (cultures, biopsies, blood, body fluids, etc.) []  - 0 Specimen(s) / Culture(s) sent or taken to Lab for analysis []  - 0 Patient Transfer (multiple staff / Civil Service fast streamer / Similar devices) []  - 0 Simple Staple / Suture removal (25 or less) []  - 0 Complex Staple / Suture removal (26 or more) []  - 0 Hypo / Hyperglycemic Management (close monitor of Blood Glucose) []  - 0 Ankle / Brachial Index (ABI) - do not check if billed separately X- 1 5 Vital Signs Demaria, [place] M. ([id number]) Has the patient been seen at the hospital within the last three years: Yes Total Score: 125 Level Of Care: New/Established - Level 4 Electronic Signature(s) Signed: 10/23/2017 4:55:35 PM By: Alric Quan Entered By: Alric Quan on 10/21/2017 16:16:35 Faron, [name] ([id number]) -------------------------------------------------------------------------------- Encounter Discharge Information Details Patient Name: [name]. Date of Service: 10/21/2017 1:30 PM Medical Record Number: [id number] Patient Account Number: [id number] Date of Birth/Sex: 1946-01-25 (72 y.o. Female) Treating RN: Carolyne Fiscal, Debi Primary  Care Jaxsyn Azam: Viviana Simpler Other Clinician: Referring Anaissa Macfadden: Viviana Simpler Treating Kalley Nicholl/Extender: Tito Dine in Treatment: 3 Encounter Discharge Information Items Discharge Pain Level: 0 Discharge Condition: Stable Ambulatory Status: Ambulatory Discharge Destination: Home Transportation: Private Auto Accompanied By: self Schedule Follow-up Appointment: Yes Medication Reconciliation completed and No provided to Patient/Care Sharna Gabrys: Provided on Clinical Summary of Care: 10/21/2017 Form Type Recipient Paper Patient LK Electronic Signature(s) Signed: 10/23/2017 4:55:35 PM By: Alric Quan Entered By: Alric Quan on 10/21/2017 15:39:01 Kell, [name] ([id number]) -------------------------------------------------------------------------------- Lower Extremity Assessment Details Patient Name: [name]. Date of Service: 10/21/2017 1:30 PM Medical Record Number: [id number] Patient Account Number: [id number] Date of Birth/Sex: [birth year] (72 y.o. Female) Treating RN: Carolyne Fiscal, Debi Primary Care Christiane Sistare: Viviana Simpler Other Clinician: Referring Athalene Kolle: Viviana Simpler Treating Dewitt Judice/Extender: Tito Dine in Treatment: 3 Edema Assessment Assessed: [Left: No] [Right: No] [Left: Edema] [Right: :] Calf Left: Right: Point of Measurement: 34 cm From Medial Instep 34.6 cm 34.6 cm Ankle Left: Right: Point of Measurement: 9 cm From Medial Instep 23.6 cm 23.2 cm Vascular Assessment Pulses: Dorsalis Pedis Palpable: [Left:Yes] [Right:Yes] Posterior Tibial Extremity colors, hair growth, and conditions: Extremity Color: [Left:Mottled] [Right:Mottled] Hair Growth on Extremity: [Left:Yes] [Right:Yes] Temperature of Extremity: [Left:Warm] [Right:Warm] Capillary Refill: [Left:< 3 seconds] [Right:< 3 seconds] Toe Nail Assessment Left: Right: Thick: Yes Yes Discolored: No No Deformed: No Improper Length and Hygiene: Yes  Yes Electronic Signature(s) Signed: 10/23/2017 4:55:35 PM By: Alric Quan Entered By: Alric Quan on 10/21/2017 14:17:03 Elahi, [name] ([id number]) -------------------------------------------------------------------------------- Multi Wound Chart Details Patient Name: [name]. Date of Service: 10/21/2017 1:30 PM Medical Record Number: [id number] Patient Account Number: [id number] Date of Birth/Sex: [birth year] (72 y.o. Female) Treating RN: Carolyne Fiscal, Debi Primary Care Heylee Tant: Viviana Simpler Other Clinician: Referring Hadessah Grennan: Viviana Simpler Treating Julis Haubner/Extender: Tito Dine in Treatment: 3 Vital Signs Height(in): 61 Pulse(bpm): 79 Weight(lbs): 179.1  Blood Pressure(mmHg): 127/67 Body Mass Index(BMI): 34 Temperature(F): Respiratory Rate 18 (breaths/min): Photos: [1:No Photos] [2:No Photos] [N/A:N/A] Wound Location: [1:Right Lower Leg - Medial] [2:Left Lower Leg - Lateral] [N/A:N/A] Wounding Event: [1:Trauma] [2:Trauma] [N/A:N/A] Primary Etiology: [1:Diabetic Wound/Ulcer of the Lower Extremity] [2:Diabetic Wound/Ulcer of the Lower Extremity] [N/A:N/A] Secondary Etiology: [1:Trauma, Other] [2:Trauma, Other] [N/A:N/A] Comorbid History: [1:Hypertension, Type II Diabetes, Osteoarthritis, Neuropathy] [2:Hypertension, Type II Diabetes, Osteoarthritis, Neuropathy] [N/A:N/A] Date Acquired: [1:08/30/2017] [2:08/30/2017] [N/A:N/A] Weeks of Treatment: [1:3] [2:3] [N/A:N/A] Wound Status: [1:Open] [2:Open] [N/A:N/A] Measurements L x W x D [1:1.8x1.9x0.8] [2:8x3.4x0.7] [N/A:N/A] (cm) Area (cm) : [1:2.686] [2:21.363] [N/A:N/A] Volume (cm) : [1:2.149] [2:14.954] [N/A:N/A] % Reduction in Area: [1:67.40%] [2:-7.90%] [N/A:N/A] % Reduction in Volume: [1:-160.50%] [2:-7.90%] [N/A:N/A] Starting Position 1 [1:8] (o'clock): Ending Position 1 [1:12] (o'clock): Maximum Distance 1 (cm): [1:0.6] Undermining: [1:Yes] [2:No] [N/A:N/A] Classification:  [1:Grade 2] [2:Grade 2] [N/A:N/A] Exudate Amount: [1:Large] [2:Large] [N/A:N/A] Exudate Type: [1:Serosanguineous] [2:Serosanguineous] [N/A:N/A] Exudate Color: [1:red, brown] [2:red, brown] [N/A:N/A] Wound Margin: [1:Distinct, outline attached] [2:Distinct, outline attached] [N/A:N/A] Granulation Amount: [1:Medium (34-66%)] [2:Medium (34-66%)] [N/A:N/A] Granulation Quality: [1:Red] [2:Red] [N/A:N/A] Necrotic Amount: [1:Medium (34-66%)] [2:Medium (34-66%)] [N/A:N/A] Exposed Structures: [1:Fascia: Yes Fat Layer (Subcutaneous Tissue) Exposed: Yes Muscle: Yes] [2:Fascia: Yes Fat Layer (Subcutaneous Tissue) Exposed: Yes Muscle: Yes Tendon: No] [N/A:N/A] Epithelialization: N/A None N/A Periwound Skin Texture: Scarring: Yes Scarring: Yes N/A Periwound Skin Moisture: No Abnormalities Noted No Abnormalities Noted N/A Periwound Skin Color: No Abnormalities Noted No Abnormalities Noted N/A Temperature: No Abnormality No Abnormality N/A Tenderness on Palpation: Yes Yes N/A Wound Preparation: Ulcer Cleansing: Ulcer Cleansing: N/A Rinsed/Irrigated with Saline, Rinsed/Irrigated with Saline, Other: soap and water Other: soap and water Topical Anesthetic Applied: Topical Anesthetic Applied: Other: lidocaine 4% Other: lidocaine 4% Treatment Notes Electronic Signature(s) Signed: 10/21/2017 4:45:41 PM By: Linton Ham MD Entered By: Linton Ham on 10/21/2017 15:07:54 Fotopoulos, [name] ([id number]) -------------------------------------------------------------------------------- [place] Details Patient Name: [name]. Date of Service: 10/21/2017 1:30 PM Medical Record Number: [id number] Patient Account Number: [id number] Date of Birth/Sex: 03-05-1946 (72 y.o. Female) Treating RN: Carolyne Fiscal, Debi Primary Care Hiba Garry: Viviana Simpler Other Clinician: Referring Paytin Ramakrishnan: Viviana Simpler Treating Takoya Jonas/Extender: Tito Dine in Treatment: 3 Active  Inactive ` Abuse / Safety / Falls / Self Care Management Nursing Diagnoses: History of Falls Potential for falls Goals: Patient will not experience any injury related to falls Date Initiated: 09/30/2017 Target Resolution Date: 01/16/2018 Goal Status: Active Interventions: Assess Activities of Daily Living upon admission and as needed Assess fall risk on admission and as needed Assess: immobility, friction, shearing, incontinence upon admission and as needed Assess impairment of mobility on admission and as needed per policy Notes: ` Nutrition Nursing Diagnoses: Imbalanced nutrition Impaired glucose control: actual or potential Potential for alteratiion in Nutrition/Potential for imbalanced nutrition Goals: Patient/caregiver agrees to and verbalizes understanding of need to use nutritional supplements and/or vitamins as prescribed Date Initiated: 09/30/2017 Target Resolution Date: 01/16/2018 Goal Status: Active Patient/caregiver will maintain therapeutic glucose control Date Initiated: 09/30/2017 Target Resolution Date: 01/16/2018 Goal Status: Active Interventions: Assess patient nutrition upon admission and as needed per policy Provide education on elevated blood sugars and impact on wound healing Notes: LUCYNDA, ROSANO ([id number]) Orientation to the Wound Care Program Nursing Diagnoses: Knowledge deficit related to the wound healing center program Goals: Patient/caregiver will verbalize understanding of the [place] Date Initiated: 09/30/2017 Target Resolution Date: 10/17/2017 Goal Status: Active Interventions: Provide education on orientation to the wound center Notes: ` Pain, Acute  or Chronic Nursing Diagnoses: Pain, acute or chronic: actual or potential Potential alteration in comfort, pain Goals: Patient/caregiver will verbalize adequate pain control between visits Date Initiated: 09/30/2017 Target Resolution Date: 01/16/2018 Goal Status:  Active Interventions: Complete pain assessment as per visit requirements Notes: ` Wound/Skin Impairment Nursing Diagnoses: Impaired tissue integrity Knowledge deficit related to ulceration/compromised skin integrity Goals: Ulcer/skin breakdown will have a volume reduction of 80% by week 12 Date Initiated: 09/30/2017 Target Resolution Date: 01/16/2018 Goal Status: Active Interventions: Assess patient/caregiver ability to perform ulcer/skin care regimen upon admission and as needed Assess ulceration(s) every visit Notes: Electronic Signature(s) Signed: 10/23/2017 4:55:35 PM By: Alric Quan Entered By: Alric Quan on 10/21/2017 14:32:48 Cogan, [name] ([id number]) Romana Juniper, Zamaya M. ([id number]) -------------------------------------------------------------------------------- Pain Assessment Details Patient Name: [name]. Date of Service: 10/21/2017 1:30 PM Medical Record Number: [id number] Patient Account Number: [id number] Date of Birth/Sex: [birth year] (72 y.o. Female) Treating RN: Carolyne Fiscal, Debi Primary Care Nailyn Dearinger: Viviana Simpler Other Clinician: Referring Ernest Popowski: Viviana Simpler Treating Alexie Lanni/Extender: Tito Dine in Treatment: 3 Active Problems Location of Pain Severity and Description of Pain Patient Has Paino No Site Locations Pain Management and Medication Current Pain Management: Electronic Signature(s) Signed: 10/23/2017 4:55:35 PM By: Alric Quan Entered By: Alric Quan on 10/21/2017 13:57:55 Mcelmurry, [name] ([id number]) -------------------------------------------------------------------------------- Patient/Caregiver Education Details Patient Name: [name]. Date of Service: 10/21/2017 1:30 PM Medical Record Number: [id number] Patient Account Number: [id number] Date of Birth/Gender: [birth year] (72 y.o. Female) Treating RN: Carolyne Fiscal, Debi Primary Care Physician: Viviana Simpler Other Clinician: Referring  Physician: Viviana Simpler Treating Physician/Extender: Tito Dine in Treatment: 3 Education Assessment Education Provided To: Patient Education Topics Provided Wound/Skin Impairment: Handouts: Caring for Your Ulcer, Other: do not get wraps wet Methods: Demonstration, Explain/Verbal Responses: State content correctly Electronic Signature(s) Signed: 10/23/2017 4:55:35 PM By: Alric Quan Entered By: Alric Quan on 10/21/2017 15:39:16 Mordecai, [name] ([id number]) -------------------------------------------------------------------------------- Wound Assessment Details Patient Name: [name]. Date of Service: 10/21/2017 1:30 PM Medical Record Number: [id number] Patient Account Number: [id number] Date of Birth/Sex: 11-Dec-1945 (72 y.o. Female) Treating RN: Carolyne Fiscal, Debi Primary Care Kynnedi Zweig: Viviana Simpler Other Clinician: Referring Carlethia Mesquita: Viviana Simpler Treating Keenan Trefry/Extender: Tito Dine in Treatment: 3 Wound Status Wound Number: 1 Primary Etiology: Diabetic Wound/Ulcer of the Lower Extremity Wound Location: Right Lower Leg - Medial Secondary Trauma, Other Wounding Event: Trauma Etiology: Date Acquired: 08/30/2017 Wound Status: Open Weeks Of Treatment: 3 Comorbid Hypertension, Type II Diabetes, Clustered Wound: No History: Osteoarthritis, Neuropathy Photos Photo Uploaded By: Alric Quan on 10/22/2017 13:23:47 Wound Measurements Length: (cm) 1.8 Width: (cm) 1.9 Depth: (cm) 0.8 Area: (cm) 2.686 Volume: (cm) 2.149 % Reduction in Area: 67.4% % Reduction in Volume: -160.5% Tunneling: No Undermining: Yes Starting Position (o'clock): 8 Ending Position (o'clock): 12 Maximum Distance: (cm) 0.6 Wound Description Classification: Grade 2 Wound Margin: Distinct, outline attached Exudate Amount: Large Exudate Type: Serosanguineous Exudate Color: red, brown Foul Odor After Cleansing: No Slough/Fibrino Yes Wound  Bed Granulation Amount: Medium (34-66%) Exposed Structure Granulation Quality: Red Fascia Exposed: Yes Necrotic Amount: Medium (34-66%) Fat Layer (Subcutaneous Tissue) Exposed: Yes Necrotic Quality: Adherent Slough Muscle Exposed: Yes Necrosis of Muscle: No Eber, Persephonie M. ([id number]) Periwound Skin Texture Texture Color No Abnormalities Noted: No No Abnormalities Noted: No Scarring: Yes Temperature / Pain Moisture Temperature: No Abnormality No Abnormalities Noted: No Tenderness on Palpation: Yes Wound Preparation Ulcer Cleansing: Rinsed/Irrigated with Saline, Other: soap and water, Topical Anesthetic Applied: Other: lidocaine 4%, Treatment Notes Wound #1 (Right,  Medial Lower Leg) 1. Cleansed with: Clean wound with Normal Saline Cleanse wound with antibacterial soap and water 2. Anesthetic Topical Lidocaine 4% cream to wound bed prior to debridement 4. Dressing Applied: Other dressing (specify in notes) 5. Secondary Dressing Applied ABD Pad Dry Gauze Kerlix/Conform Notes silvercel, unna to anchor, charcoal, xtrasorb, kerlix, Event organiser) Signed: 10/23/2017 4:55:35 PM By: Alric Quan Entered By: Alric Quan on 10/21/2017 14:23:06 Rigg, [name] ([id number]) -------------------------------------------------------------------------------- Wound Assessment Details Patient Name: [name]. Date of Service: 10/21/2017 1:30 PM Medical Record Number: [id number] Patient Account Number: [id number] Date of Birth/Sex: 1945-11-29 (72 y.o. Female) Treating RN: Carolyne Fiscal, Debi Primary Care Khyrin Trevathan: Viviana Simpler Other Clinician: Referring Levonia Wolfley: Viviana Simpler Treating Ja Pistole/Extender: Tito Dine in Treatment: 3 Wound Status Wound Number: 2 Primary Etiology: Diabetic Wound/Ulcer of the Lower Extremity Wound Location: Left Lower Leg - Lateral Secondary Trauma, Other Wounding Event: Trauma Etiology: Date Acquired:  08/30/2017 Wound Status: Open Weeks Of Treatment: 3 Comorbid Hypertension, Type II Diabetes, Clustered Wound: No History: Osteoarthritis, Neuropathy Photos Photo Uploaded By: Alric Quan on 10/22/2017 13:23:47 Wound Measurements Length: (cm) 8 Width: (cm) 3.4 Depth: (cm) 0.7 Area: (cm) 21.363 Volume: (cm) 14.954 % Reduction in Area: -7.9% % Reduction in Volume: -7.9% Epithelialization: None Tunneling: No Undermining: No Wound Description Classification: Grade 2 Wound Margin: Distinct, outline attached Exudate Amount: Large Exudate Type: Serosanguineous Exudate Color: red, brown Foul Odor After Cleansing: No Slough/Fibrino Yes Wound Bed Granulation Amount: Medium (34-66%) Exposed Structure Granulation Quality: Red Fascia Exposed: Yes Necrotic Amount: Medium (34-66%) Fat Layer (Subcutaneous Tissue) Exposed: Yes Necrotic Quality: Adherent Slough Tendon Exposed: No Muscle Exposed: Yes Necrosis of Muscle: No Periwound Skin Texture Texture Color Hashman, Temara M. ([id number]) No Abnormalities Noted: No No Abnormalities Noted: No Scarring: Yes Temperature / Pain Moisture Temperature: No Abnormality No Abnormalities Noted: No Tenderness on Palpation: Yes Wound Preparation Ulcer Cleansing: Rinsed/Irrigated with Saline, Other: soap and water, Topical Anesthetic Applied: Other: lidocaine 4%, Treatment Notes Wound #2 (Left, Lateral Lower Leg) 1. Cleansed with: Clean wound with Normal Saline Cleanse wound with antibacterial soap and water 2. Anesthetic Topical Lidocaine 4% cream to wound bed prior to debridement 4. Dressing Applied: Other dressing (specify in notes) 5. Secondary Dressing Applied ABD Pad Dry Gauze Kerlix/Conform Notes silvercel, unna to anchor, charcoal, xtrasorb, kerlix, Event organiser) Signed: 10/23/2017 4:55:35 PM By: Alric Quan Entered By: Alric Quan on 10/21/2017 14:20:05 [name]  ([id number]) -------------------------------------------------------------------------------- [place] Details Patient Name: [name]. Date of Service: 10/21/2017 1:30 PM Medical Record Number: [id number] Patient Account Number: [id number] Date of Birth/Sex: Jan 02, 1946 (72 y.o. Female) Treating RN: Carolyne Fiscal, Debi Primary Care Constantin Hillery: Viviana Simpler Other Clinician: Referring Patriciaann Rabanal: Viviana Simpler Treating Axcel Horsch/Extender: Tito Dine in Treatment: 3 Vital Signs Time Taken: 13:58 Pulse (bpm): 79 Height (in): 61 Respiratory Rate (breaths/min): 18 Weight (lbs): 179.1 Blood Pressure (mmHg): 127/67 Body Mass Index (BMI): 33.8 Reference Range: 80 - 120 mg / dl Electronic Signature(s) Signed: 10/23/2017 4:55:35 PM By: Alric Quan Entered By: Alric Quan on 10/21/2017 13:58:19

## 2017-10-25 NOTE — Progress Notes (Signed)
KAARI, ZEIGLER Pickens ([id number]) Visit Report for 10/21/2017 HPI Details Patient Name: [name], [name]. Date of Service: 10/21/2017 1:30 PM Medical Record Number: [id number] Patient Account Number: [id number] Date of Birth/Sex: [birth year] (71 y.o. Female) Treating RN: Carolyne Fiscal, Debi Primary Care Provider: Viviana Simpler Other Clinician: Referring Provider: Viviana Simpler Treating Provider/Extender: Tito Dine in Treatment: 3 History of Present Illness HPI Description: 09/30/17; patient is a 72 year old woman with type 2 diabetes and peripheral neuropathy. On 08/30/17 she fell down some steps. She developed redness and swelling and was seen in her primary physician's office on 09/11/17. She was felt to have cellulitis and given Keflex. She will return for follow-up on 09/18/17 noted to have an 8 x 4 cm contusion on the left lateral lower leg and a 3 x 3 cm contusion on the right medial lower leg. At follow-up on 09/29/16 she had a 4 x 4 centimeter necrotic surface and she's been referred here. The patient is not on blood thinners she does not have a history of falling. She states her diabetes is under good control ABIs in this clinic could not be obtained because of pain. Patient does not have a prior wound history 10/07/17; the patient came back to the clinic last week for a nurse rewrap. Noted that the area on the right medial leg had opened as well. She is using silver alginate under compression. The substantial wound on the left anterior leg is malodorous today. I did a culture of the wound surface but no empiric antibiotics. The area on the right lateral leg is open with a small draining open area with gelatinous old blood. Using pickups and scalpel I remove necrotic surface from around this area over the entire area, pulse fairly substantially inferiorly. We've been using silver alginate to both wound areas 10/14/17; patient still has substantial drainage from these areas although  they actually look some better. The area on the right lateral leg is opened up and there is depth of this. The area on the left leg is larger but more superficial. This area hasn't bridged area of skin that I thought I would have to removed however the granulation as come up and I'm wondering whether we can simply watch this for now these both were originally hematomas from trauma. CULTURE I did last week showed Escherichia coli and Pseudomonas both sensitive to ciprofloxacin. This was called in for her to replace the doxycycline I prescribed empirically 10/21/17; patient is making reasonably good progress in both wound areas include using silver alginate. Paradoxically the larger original wound area actually looks better. Ears no evidence of infection. Electronic Signature(s) Signed: 10/21/2017 4:45:41 PM By: Linton Ham MD Entered By: Linton Ham on 10/21/2017 15:08:45 Rippeon, [name] ([id number]) -------------------------------------------------------------------------------- Physical Exam Details Patient Name: [name]. Date of Service: 10/21/2017 1:30 PM Medical Record Number: [id number] Patient Account Number: [id number] Date of Birth/Sex: July 09, 1946 (71 y.o. Female) Treating RN: Carolyne Fiscal, Debi Primary Care Provider: Viviana Simpler Other Clinician: Referring Provider: Viviana Simpler Treating Provider/Extender: Tito Dine in Treatment: 3 Constitutional Sitting or standing Blood Pressure is within target range for patient.. Pulse regular and within target range for patient.Marland Kitchen Respirations regular, non-labored and within target range.. Temperature is normal and within the target range for the patient.Marland Kitchen appears in no distress. Eyes Conjunctivae clear. No discharge. Respiratory Respiratory effort is easy and symmetric bilaterally. Rate is normal at rest and on room air.. Cardiovascular Pedal pulses palpable and strong bilaterally.. Integumentary (Hair,  Skin) No systemic  skin issues. Notes Wound exam; initially the more substantive wound is on the left lateral leg. She has 2 areas separated by an area of normal skin. These of come up nicely and for now I don't think any further debridement is going to be necessary in this area. Dimensions better oRight leg still has some depth and 0.6 cm probing superiorly although the base of this also looks quite good. oNo evidence of infection in either area Electronic Signature(s) Signed: 10/21/2017 4:45:41 PM By: Linton Ham MD Entered By: Linton Ham on 10/21/2017 15:10:33 [name], [name] ([id number]) -------------------------------------------------------------------------------- Physician Orders Details Patient Name: [name]. Date of Service: 10/21/2017 1:30 PM Medical Record Number: [id number] Patient Account Number: [id number] Date of Birth/Sex: [birth year] (71 y.o. Female) Treating RN: Carolyne Fiscal, Debi Primary Care Provider: Viviana Simpler Other Clinician: Referring Provider: Viviana Simpler Treating Provider/Extender: Tito Dine in Treatment: 3 Verbal / Phone Orders: Yes Clinician: Pinkerton, Debi Read Back and Verified: Yes Diagnosis Coding Wound Cleansing Wound #1 Right,Medial Lower Leg o Clean wound with Normal Saline. o Cleanse wound with mild soap and water Wound #2 Left,Lateral Lower Leg o Clean wound with Normal Saline. o Cleanse wound with mild soap and water Anesthetic (add to Medication List) Wound #1 Right,Medial Lower Leg o Topical Lidocaine 4% cream applied to wound bed prior to debridement (In Clinic Only). Wound #2 Left,Lateral Lower Leg o Topical Lidocaine 4% cream applied to wound bed prior to debridement (In Clinic Only). Primary Wound Dressing Wound #1 Right,Medial Lower Leg o Silvercel Non-Adherent Wound #2 Left,Lateral Lower Leg o Silvercel Non-Adherent Secondary Dressing Wound #1 Right,Medial Lower Leg o ABD  pad o Dry Gauze o XtraSorb o Other - charcoal Wound #2 Left,Lateral Lower Leg o ABD pad o Dry Gauze o XtraSorb o Other - charcoal Dressing Change Frequency Wound #1 Right,Medial Lower Leg o Other: - twice a week Wound #2 Left,Lateral Lower Leg o Other: - twice a week [name], ZEIDERS. ([id number]) Follow-up Appointments Wound #1 Right,Medial Lower Leg o Return Appointment in 1 week. o Nurse Visit as needed Wound #2 Left,Lateral Lower Leg o Return Appointment in 1 week. o Nurse Visit as needed Edema Control Wound #1 Right,Medial Lower Leg o Kerlix and Coban - Bilateral - unna to anchor o Elevate legs to the level of the heart and pump ankles as often as possible Wound #2 Left,Lateral Lower Leg o Kerlix and Coban - Bilateral - unna to anchor o Elevate legs to the level of the heart and pump ankles as often as possible Additional Orders / Instructions Wound #1 Right,Medial Lower Leg o Increase protein intake. o Other: - Vitamin C, Zinc Wound #2 Left,Lateral Lower Leg o Increase protein intake. o Other: - Vitamin C, Zinc Patient Medications Allergies: NKDA Notifications Medication Indication Start End lidocaine DOSE 1 - topical 4 % cream - 1 cream topical Electronic Signature(s) Signed: 10/21/2017 4:45:41 PM By: Linton Ham MD Signed: 10/23/2017 4:55:35 PM By: Alric Quan Entered By: Alric Quan on 10/21/2017 14:34:25 [name], [name] ([id number]) -------------------------------------------------------------------------------- Prescription 10/21/2017 Patient Name: [name]. Provider: Ricard Dillon MD Date of Birth: 14-Mar-1946 NPI#: [id number] Sex: F DEA#: [id number] Phone #: [phone] License #: [id number] Patient Address: [address], [place], [place] [address] [place], [place]  [address] [phone] Allergies NKDA Medication Medication: Route: Strength: Form: lidocaine topical 4% cream Class: TOPICAL LOCAL ANESTHETICS Dose: Frequency / Time: Indication: 1  1 cream topical Number of Refills: Number of Units: 0 Generic Substitution: Start Date: End Date: Administered at [place]: Yes Time Administered: Time Discontinued: Note to Pharmacy: Signature(s): Date(s): Electronic Signature(s) Signed: 10/21/2017 4:45:41 PM By: Linton Ham MD Signed: 10/23/2017 4:55:35 PM By: Alric Quan Entered By: Alric Quan on 10/21/2017 14:34:25 [name], [name] ([id number]) [name], [name] ([id number]) --------------------------------------------------------------------------------  Problem List Details Patient Name: [name]. Date of Service: 10/21/2017 1:30 PM Medical Record Number: [id number] Patient Account Number: [id number] Date of Birth/Sex: Oct 21, 1945 (71 y.o. 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 09/30/2017 Yes L97.218 Non-pressure chronic ulcer of right calf with other specified severity 09/30/2017 Yes E11.40 Type 2 diabetes mellitus with diabetic neuropathy, unspecified 09/30/2017 Yes I87.331 Chronic venous hypertension (idiopathic) with ulcer and 10/07/2017 Yes inflammation of right lower extremity Inactive Problems Resolved Problems Electronic Signature(s) Signed: 10/21/2017 4:45:41 PM By: Linton Ham MD Entered By: Linton Ham on 10/21/2017 15:07:43 [name], [name] ([id number]) -------------------------------------------------------------------------------- Progress Note Details Patient Name:  [name]. Date of Service: 10/21/2017 1:30 PM Medical Record Number: [id number] Patient Account Number: [id number] Date of Birth/Sex: 06/14/1946 (71 y.o. Female) Treating RN: Carolyne Fiscal, Debi Primary Care Provider: Viviana Simpler Other Clinician: Referring Provider: Viviana Simpler Treating Provider/Extender: Tito Dine in Treatment: 3 Subjective History of Present Illness (HPI) 09/30/17; patient is a 72 year old woman with type 2 diabetes and peripheral neuropathy. On 08/30/17 she fell down some steps. She developed redness and swelling and was seen in her primary physician's office on 09/11/17. She was felt to have cellulitis and given Keflex. She will return for follow-up on 09/18/17 noted to have an 8 x 4 cm contusion on the left lateral lower leg and a 3 x 3 cm contusion on the right medial lower leg. At follow-up on 09/29/16 she had a 4 x 4 centimeter necrotic surface and she's been referred here. The patient is not on blood thinners she does not have a history of falling. She states her diabetes is under good control ABIs in this clinic could not be obtained because of pain. Patient does not have a prior wound history 10/07/17; the patient came back to the clinic last week for a nurse rewrap. Noted that the area on the right medial leg had opened as well. She is using silver alginate under compression. The substantial wound on the left anterior leg is malodorous today. I did a culture of the wound surface but no empiric antibiotics. The area on the right lateral leg is open with a small draining open area with gelatinous old blood. Using pickups and scalpel I remove necrotic surface from around this area over the entire area, pulse fairly substantially inferiorly. We've been using silver alginate to both wound areas 10/14/17; patient still has substantial drainage from these areas although they actually look some better. The area on the right lateral leg is opened up and  there is depth of this. The area on the left leg is larger but more superficial. This area hasn't bridged area of skin that I thought I would have to removed however the granulation as come up and I'm wondering whether we can simply watch this for now these both were originally hematomas from trauma. CULTURE I did last week showed Escherichia coli and  Pseudomonas both sensitive to ciprofloxacin. This was called in for her to replace the doxycycline I prescribed empirically 10/21/17; patient is making reasonably good progress in both wound areas include using silver alginate. Paradoxically the larger original wound area actually looks better. Ears no evidence of infection. Objective Constitutional Sitting or standing Blood Pressure is within target range for patient.. Pulse regular and within target range for patient.Marland Kitchen Respirations regular, non-labored and within target range.. Temperature is normal and within the target range for the patient.Marland Kitchen appears in no distress. Vitals Time Taken: 1:58 PM, Height: 61 in, Weight: 179.1 lbs, BMI: 33.8, Pulse: 79 bpm, Respiratory Rate: 18 breaths/min, Blood Pressure: 127/67 mmHg. Eyes Conjunctivae clear. No discharge. [name], [name] ([id number]) Respiratory Respiratory effort is easy and symmetric bilaterally. Rate is normal at rest and on room air.. Cardiovascular Pedal pulses palpable and strong bilaterally.. General Notes: Wound exam; initially the more substantive wound is on the left lateral leg. She has 2 areas separated by an area of normal skin. These of come up nicely and for now I don't think any further debridement is going to be necessary in this area. Dimensions better Right leg still has some depth and 0.6 cm probing superiorly although the base of this also looks quite good. No evidence of infection in either area Integumentary (Hair, Skin) No systemic skin issues. Wound #1 status is Open. Original cause of wound was Trauma. The wound is  located on the Right,Medial Lower Leg. The wound measures 1.8cm length x 1.9cm width x 0.8cm depth; 2.686cm^2 area and 2.149cm^3 volume. There is muscle, Fat Layer (Subcutaneous Tissue) Exposed, and fascia exposed. There is no tunneling noted, however, there is undermining starting at 8:00 and ending at 12:00 with a maximum distance of 0.6cm. There is a large amount of serosanguineous drainage noted. The wound margin is distinct with the outline attached to the wound base. There is medium (34-66%) red granulation within the wound bed. There is a medium (34-66%) amount of necrotic tissue within the wound bed including Adherent Slough. The periwound skin appearance exhibited: Scarring. Periwound temperature was noted as No Abnormality. The periwound has tenderness on palpation. Wound #2 status is Open. Original cause of wound was Trauma. The wound is located on the Left,Lateral Lower Leg. The wound measures 8cm length x 3.4cm width x 0.7cm depth; 21.363cm^2 area and 14.954cm^3 volume. There is muscle, Fat Layer (Subcutaneous Tissue) Exposed, and fascia exposed. There is no tunneling or undermining noted. There is a large amount of serosanguineous drainage noted. The wound margin is distinct with the outline attached to the wound base. There is medium (34-66%) red granulation within the wound bed. There is a medium (34-66%) amount of necrotic tissue within the wound bed including Adherent Slough. The periwound skin appearance exhibited: Scarring. Periwound temperature was noted as No Abnormality. The periwound has tenderness on palpation. 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 Cleanse wound with mild soap and water Wound #2 Left,Lateral Lower Leg: [name], [name]. ([id number]) Clean wound with Normal Saline. Cleanse wound with mild soap and water Anesthetic (add to Medication List): Wound #1 Right,Medial Lower Leg: Topical Lidocaine 4% cream applied to wound  bed prior to debridement (In Clinic Only). Wound #2 Left,Lateral Lower Leg: Topical Lidocaine 4% cream applied to wound bed prior to debridement (In Clinic Only). Primary Wound Dressing: Wound #1 Right,Medial Lower Leg: Silvercel Non-Adherent Wound #2 Left,Lateral Lower Leg: Silvercel Non-Adherent Secondary Dressing: Wound #1 Right,Medial Lower Leg: ABD pad Dry Gauze XtraSorb Other - charcoal Wound #2 Left,Lateral Lower Leg: ABD pad Dry Gauze XtraSorb Other - charcoal Dressing Change Frequency: Wound #1 Right,Medial Lower Leg: Other: - twice a week Wound #2 Left,Lateral Lower Leg: Other: - twice a week Follow-up Appointments: Wound #1 Right,Medial Lower Leg: Return Appointment in 1 week. Nurse Visit as needed Wound #2 Left,Lateral Lower Leg: Return Appointment in 1 week. Nurse Visit as needed Edema Control: Wound #1 Right,Medial Lower Leg: Kerlix and Coban - Bilateral - unna to anchor Elevate legs to the level of the heart and pump ankles as often as possible Wound #2 Left,Lateral Lower Leg: Kerlix and Coban - Bilateral - unna to anchor Elevate legs to the level of the heart and pump ankles as often as possible Additional Orders / Instructions: Wound #1 Right,Medial Lower Leg: Increase protein intake. Other: - Vitamin C, Zinc Wound #2 Left,Lateral Lower Leg: Increase protein intake. Other: - Vitamin C, Zinc The following medication(s) was prescribed: lidocaine topical 4 % cream 1 1 cream topical was prescribed at facility [facility], [name]. ([id number]) o #1 both wound areas  continue to make nice progress. No further debridement necessary for now #2 no further antibiotics #3 continue silver alginate/ABDs under Kerlix Coban #4 could consider changing this over collagen which might stimulate more granulation Electronic Signature(s) Signed: 10/21/2017 4:45:41 PM By: Linton Ham MD Entered By: Linton Ham on 10/21/2017 15:12:20 [name], [name] ([id number]) -------------------------------------------------------------------------------- SuperBill Details Patient Name: [name]. Date of Service: 10/21/2017 Medical Record Number: [id number] Patient Account Number: [id number] Date of Birth/Sex: 11-01-1945 (72 y.o. Female) Treating RN: Carolyne Fiscal, Debi Primary Care Provider: Viviana Simpler Other Clinician: Referring Provider: Viviana Simpler Treating Provider/Extender: Tito Dine in Treatment: 3 Diagnosis Coding ICD-10 Codes Code Description [phone] 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e L97.218 Non-pressure chronic ulcer of right calf with other specified severity E11.40 Type 2 diabetes mellitus with diabetic neuropathy, unspecified I87.331 Chronic venous hypertension (idiopathic) with ulcer and inflammation of right lower extremity Facility Procedures CPT4 Code: [id number] Description: 99214 - WOUND CARE VISIT-LEV 4 EST PT Modifier: Quantity: 1 Physician Procedures CPT4 Code: 9935701 Description: 77939 - WC PHYS LEVEL 3 - EST PT ICD-10 Diagnosis Description 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 L97.218 Non-pressure chronic ulcer of right calf with other specifie Modifier: cle d severity Quantity: 1 Electronic Signature(s) Signed: 10/21/2017 4:16:42 PM By: Alric Quan Signed: 10/21/2017 4:45:41 PM By: Linton Ham MD Entered By: Alric Quan on 10/21/2017 16:16:42

## 2017-10-26 ENCOUNTER — Other Ambulatory Visit: Payer: Self-pay | Admitting: Internal Medicine

## 2017-10-28 ENCOUNTER — Encounter: Payer: Self-pay | Admitting: Internal Medicine

## 2017-10-28 ENCOUNTER — Encounter: Payer: Medicare Other | Admitting: Internal Medicine

## 2017-10-28 DIAGNOSIS — E1140 Type 2 diabetes mellitus with diabetic neuropathy, unspecified: Secondary | ICD-10-CM | POA: Diagnosis not present

## 2017-10-28 DIAGNOSIS — I10 Essential (primary) hypertension: Secondary | ICD-10-CM | POA: Diagnosis not present

## 2017-10-28 DIAGNOSIS — L97218 Non-pressure chronic ulcer of right calf with other specified severity: Secondary | ICD-10-CM | POA: Diagnosis not present

## 2017-10-28 DIAGNOSIS — L97222 Non-pressure chronic ulcer of left calf with fat layer exposed: Secondary | ICD-10-CM | POA: Diagnosis not present

## 2017-10-28 DIAGNOSIS — L97212 Non-pressure chronic ulcer of right calf with fat layer exposed: Secondary | ICD-10-CM | POA: Diagnosis not present

## 2017-10-28 DIAGNOSIS — L97223 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e: Secondary | ICD-10-CM | POA: Diagnosis not present

## 2017-10-28 DIAGNOSIS — E1151 Type 2 diabetes mellitus with diabetic peripheral angiopathy without gangrene: Secondary | ICD-10-CM | POA: Diagnosis not present

## 2017-10-28 DIAGNOSIS — E11622 Type 2 diabetes mellitus with other skin ulcer: Secondary | ICD-10-CM | POA: Diagnosis not present

## 2017-10-28 DIAGNOSIS — I87331 Chronic venous hypertension (idiopathic) with ulcer and inflammation of right lower extremity: Secondary | ICD-10-CM | POA: Diagnosis not present

## 2017-10-28 NOTE — Telephone Encounter (Signed)
Please correct the prescription for the new dosage and send new order

## 2017-10-29 MED ORDER — GABAPENTIN 300 MG PO CAPS: ORAL_CAPSULE | ORAL | 11 refills | Status: DC

## 2017-10-29 NOTE — Progress Notes (Signed)
[name], [name] ([id number]) Visit Report for 10/28/2017 Debridement Details Patient Name: Alexis, Velez. Date of Service: 10/28/2017 10:15 AM Medical Record Number: [id number] Patient Account Number: [id number] Date of Birth/Sex: 06/18/1946 (72 y.o. Female) Treating RN: Cornell Barman Primary Care Provider: Viviana Simpler Other Clinician: Referring Provider: Viviana Simpler Treating Provider/Extender: Tito Dine in Treatment: 4 Debridement Performed for Wound #1 Right,Medial Lower Leg Assessment: Performed By: Physician Ricard Dillon, MD Debridement: Debridement Severity of Tissue Pre Fat layer exposed Debridement: Pre-procedure Verification/Time Yes - 10:20 Out Taken: Start Time: 10:21 Pain Control: Other : lidocaione 4% Level: Skin/Subcutaneous Tissue Total Area Debrided (L x W): 1.9 (cm) x 2 (cm) = 3.8 (cm) Tissue and other material Viable, Non-Viable, Fat, Fibrin/Slough, Subcutaneous debrided: Instrument: Curette Bleeding: Moderate Hemostasis Achieved: Pressure End Time: 10:25 Procedural Pain: 2 Post Procedural Pain: 2 Response to Treatment: Procedure was tolerated well Post Debridement Measurements of Total Wound Length: (cm) 1.9 Width: (cm) 2 Depth: (cm) 0.8 Volume: (cm) 2.388 Character of Wound/Ulcer Post Debridement: Requires Further Debridement Severity of Tissue Post Debridement: Fat layer exposed Post Procedure Diagnosis Same as Pre-procedure Electronic Signature(s) Signed: 10/28/2017 5:05:40 PM By: Linton Ham MD Signed: 10/28/2017 5:24:42 PM By: Gretta Cool, BSN, RN, CWS, Kim RN, BSN Entered By: Linton Ham on 10/28/2017 10:32:47 Strauser, [name] ([id number]) -------------------------------------------------------------------------------- Debridement Details Patient Name: [name]. Date of Service: 10/28/2017 10:15 AM Medical Record Number: [id number] Patient Account Number: [id number] Date of Birth/Sex: 08-22-1946 (72 y.o.  Female) Treating RN: Cornell Barman Primary Care Provider: Viviana Simpler Other Clinician: Referring Provider: Viviana Simpler Treating Provider/Extender: Tito Dine in Treatment: 4 Debridement Performed for Wound #2 Left,Lateral Lower Leg Assessment: Performed By: Physician Ricard Dillon, MD Debridement: Debridement Severity of Tissue Pre Fat layer exposed Debridement: Pre-procedure Verification/Time Yes - 10:20 Out Taken: Start Time: 10:21 Pain Control: Other : lidocaione 4% Level: Skin/Subcutaneous Tissue Total Area Debrided (L x W): 3 (cm) x 3 (cm) = 9 (cm) Tissue and other material Viable, Non-Viable, Fat, Fibrin/Slough, Subcutaneous debrided: Instrument: Curette Bleeding: Moderate Hemostasis Achieved: Pressure End Time: 10:25 Procedural Pain: 2 Post Procedural Pain: 2 Response to Treatment: Procedure was tolerated well Post Debridement Measurements of Total Wound Length: (cm) 7 Width: (cm) 2.8 Depth: (cm) 0.4 Volume: (cm) 6.158 Character of Wound/Ulcer Post Debridement: Requires Further Debridement Severity of Tissue Post Debridement: Fat layer exposed Post Procedure Diagnosis Same as Pre-procedure Electronic Signature(s) Signed: 10/28/2017 5:05:40 PM By: Linton Ham MD Signed: 10/28/2017 5:24:42 PM By: Gretta Cool, BSN, RN, CWS, Kim RN, BSN Entered By: Linton Ham on 10/28/2017 10:33:03 [name] ([id number]) -------------------------------------------------------------------------------- HPI Details Patient Name: [name]. Date of Service: 10/28/2017 10:15 AM Medical Record Number: [id number] Patient Account Number: [id number] Date of Birth/Sex: 1945-09-14 (72 y.o. Female) Treating RN: Cornell Barman Primary Care Provider: Viviana Simpler Other Clinician: Referring Provider: Viviana Simpler Treating Provider/Extender: Tito Dine in Treatment: 4 History of Present Illness HPI Description: 09/30/17; patient is a  72 year old woman with type 2 diabetes and peripheral neuropathy. On 08/30/17 she fell down some steps. She developed redness and swelling and was seen in her primary physician's office on 09/11/17. She was felt to have cellulitis and given Keflex. She will return for follow-up on 09/18/17 noted to have an 8 x 4 cm contusion on the left lateral lower leg and a 3 x 3 cm contusion on the right medial lower leg. At follow-up on 09/29/16 she had a 4 x 4 centimeter necrotic surface and she's  been referred here. The patient is not on blood thinners she does not have a history of falling. She states her diabetes is under good control ABIs in this clinic could not be obtained because of pain. Patient does not have a prior wound history 10/07/17; the patient came back to the clinic last week for a nurse rewrap. Noted that the area on the right medial leg had opened as well. She is using silver alginate under compression. The substantial wound on the left anterior leg is malodorous today. I did a culture of the wound surface but no empiric antibiotics. The area on the right lateral leg is open with a small draining open area with gelatinous old blood. Using pickups and scalpel I remove necrotic surface from around this area over the entire area, pulse fairly substantially inferiorly. We've been using silver alginate to both wound areas 10/14/17; patient still has substantial drainage from these areas although they actually look some better. The area on the right lateral leg is opened up and there is depth of this. The area on the left leg is larger but more superficial. This area hasn't bridged area of skin that I thought I would have to removed however the granulation as come up and I'm wondering whether we can simply watch this for now these both were originally hematomas from trauma. CULTURE I did last week showed Escherichia coli and Pseudomonas both sensitive to ciprofloxacin. This was called in for her to  replace the doxycycline I prescribed empirically 10/21/17; patient is making reasonably good progress in both wound areas include using silver alginate. Paradoxically the larger original wound area actually looks better. Ears no evidence of infection. 10/28/17; patient continues to make excellent progress in both wound areas using silver alginate. I have been pleasantly surprised by the progress. I note ABIs of 1.25 on the right and 1.24 on the left. Electronic Signature(s) Signed: 10/28/2017 5:05:40 PM By: Linton Ham MD Entered By: Linton Ham on 10/28/2017 10:34:48 Byers, [name] ([id number]) -------------------------------------------------------------------------------- Physical Exam Details Patient Name: [name]. Date of Service: 10/28/2017 10:15 AM Medical Record Number: [id number] Patient Account Number: [id number] Date of Birth/Sex: Jan 18, 1946 (72 y.o. Female) Treating RN: Cornell Barman Primary Care Provider: Viviana Simpler Other Clinician: Referring Provider: Viviana Simpler Treating Provider/Extender: Tito Dine in Treatment: 4 Notes Wound exam; initially the more substantial wound on the left had 2 separate areas separated by normal skin area and I really thought that the normal skin with need to be sacrificed however it is magically adhered to the advancing granulation. Only the lower part of this wound needed debridement today with an open curet removing nonviable necrotic tissue oThe area on the right medial also looks improved still some depth and undermining. Debrided with an open curet of nonviable necrotic tissue. oHemostasis in both areas with direct pressure she tolerated this reasonably well Electronic Signature(s) Signed: 10/28/2017 5:05:40 PM By: Linton Ham MD Entered By: Linton Ham on 10/28/2017 10:36:06 [name] ([id number]) -------------------------------------------------------------------------------- Physician  Orders Details Patient Name: [name]. Date of Service: 10/28/2017 10:15 AM Medical Record Number: [id number] Patient Account Number: [id number] Date of Birth/Sex: [birth year] (72 y.o. Female) Treating RN: Cornell Barman Primary Care Provider: Viviana Simpler Other Clinician: Referring Provider: Viviana Simpler Treating Provider/Extender: Tito Dine in Treatment: 4 Verbal / Phone Orders: No Diagnosis Coding Wound Cleansing Wound #1 Right,Medial Lower Leg o Clean wound with Normal Saline. o Cleanse wound with mild soap and water Wound #2 Left,Lateral  Lower Leg o Clean wound with Normal Saline. o Cleanse wound with mild soap and water Anesthetic (add to Medication List) Wound #1 Right,Medial Lower Leg o Topical Lidocaine 4% cream applied to wound bed prior to debridement (In Clinic Only). o Hurricaine Topical Anesthetic Spray applied to wound bed prior to debridement (In Clinic Only). Wound #2 Left,Lateral Lower Leg o Topical Lidocaine 4% cream applied to wound bed prior to debridement (In Clinic Only). o Hurricaine Topical Anesthetic Spray applied to wound bed prior to debridement (In Clinic Only). Primary Wound Dressing Wound #1 Right,Medial Lower Leg o Silvercel Non-Adherent Wound #2 Left,Lateral Lower Leg o Silvercel Non-Adherent Secondary Dressing Wound #1 Right,Medial Lower Leg o ABD pad o Other - charcoal Wound #2 Left,Lateral Lower Leg o ABD pad o Other - charcoal Dressing Change Frequency Wound #1 Right,Medial Lower Leg o Other: - twice a week Wound #2 Left,Lateral Lower Leg o Other: - twice a week Follow-up Appointments Wound #1 Right,Medial Lower Leg JAELY, SILMAN. ([id number]) o Return Appointment in 1 week. o Nurse Visit as needed Wound #2 Left,Lateral Lower Leg o Return Appointment in 1 week. o Nurse Visit as needed Edema Control Wound #1 Right,Medial Lower Leg o Kerlix and Coban - Bilateral -  unna to anchor o Elevate legs to the level of the heart and pump ankles as often as possible Wound #2 Left,Lateral Lower Leg o Kerlix and Coban - Bilateral - unna to anchor o Elevate legs to the level of the heart and pump ankles as often as possible Additional Orders / Instructions Wound #1 Right,Medial Lower Leg o Increase protein intake. o Other: - Vitamin C, Zinc Wound #2 Left,Lateral Lower Leg o Increase protein intake. o Other: - Vitamin C, Zinc Patient Medications Allergies: NKDA Notifications Medication Indication Start End lidocaine DOSE topical 4 % cream - cream topical Electronic Signature(s) Signed: 10/28/2017 5:05:40 PM By: Linton Ham MD Signed: 10/28/2017 5:24:42 PM By: Gretta Cool, BSN, RN, CWS, Kim RN, BSN Entered By: Gretta Cool, BSN, RN, CWS, Kim on 10/28/2017 10:30:13 SIBLE, STRALEY ([id number]) -------------------------------------------------------------------------------- Prescription 10/28/2017 Patient Name: [name]. Provider: Ricard Dillon MD Date of Birth: 1946/04/08 NPI#: [id number] Sex: F DEA#: [id number] Phone #: [phone] License #: [id number] Patient Address: [address], [address], [place] [address] [place], [place] [address] [phone] Allergies NKDA Medication Medication: Route: Strength: Form: lidocaine topical 4% cream Class: TOPICAL LOCAL ANESTHETICS Dose: Frequency / Time: Indication: cream topical Number of Refills: Number of Units: 0 Generic Substitution: Start Date: End Date: Administered at [facility]: Yes Time Administered: Time Discontinued: Note to Pharmacy: Signature(s): Date(s): Electronic Signature(s) Signed: 10/28/2017 5:05:40 PM By: Linton Ham MD Signed: 10/28/2017 5:24:42 PM By: Gretta Cool, BSN, RN, CWS, Kim RN, BSN Entered By: Gretta Cool, BSN, RN, CWS, Kim on  10/28/2017 10:30:14 [name] ([id number]) [address] Taviana M. ([id number]) --------------------------------------------------------------------------------  Problem List Details Patient Name: DORENA, DORFMAN. Date of Service: 10/28/2017 10:15 AM Medical Record Number: [id number] Patient Account Number: [id number] Date of Birth/Sex: 1946/05/11 (72 y.o. 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 09/30/2017 Yes L97.218 Non-pressure chronic ulcer of right calf with other specified severity 09/30/2017 Yes E11.40 Type 2 diabetes mellitus with diabetic neuropathy,  unspecified 09/30/2017 Yes I87.331 Chronic venous hypertension (idiopathic) with ulcer and 10/07/2017 Yes inflammation of right lower extremity Inactive Problems Resolved Problems Electronic Signature(s) Signed: 10/28/2017 5:05:40 PM By: Linton Ham MD Entered By: Linton Ham on 10/28/2017 10:31:51 [place], [name] ([id number]) -------------------------------------------------------------------------------- Progress Note Details Patient Name: [name]. Date of Service: 10/28/2017 10:15 AM Medical Record Number: [id number] Patient Account Number: [id number] Date of Birth/Sex: Mar 21, 1946 (72 y.o. Female) Treating RN: Cornell Barman Primary Care Provider: Viviana Simpler Other Clinician: Referring Provider: Viviana Simpler Treating Provider/Extender: Tito Dine in Treatment: 4 Subjective History of Present Illness (HPI) 09/30/17; patient is a 72 year old woman with type 2 diabetes and peripheral neuropathy. On 08/30/17 she fell down some steps. She developed redness and swelling and was seen in her primary  physician's office on 09/11/17. She was felt to have cellulitis and given Keflex. She will return for follow-up on 09/18/17 noted to have an 8 x 4 cm contusion on the left lateral lower leg and a 3 x 3 cm contusion on the right medial lower leg. At follow-up on 09/29/16 she had a 4 x 4 centimeter necrotic surface and she's been referred here. The patient is not on blood thinners she does not have a history of falling. She states her diabetes is under good control ABIs in this clinic could not be obtained because of pain. Patient does not have a prior wound history 10/07/17; the patient came back to the clinic last week for a nurse rewrap. Noted that the area on the right medial leg had opened as well. She is using silver alginate under compression. The substantial wound on the left anterior leg is malodorous today. I did a culture of the wound surface but no empiric antibiotics. The area on the right lateral leg is open with a small draining open area with gelatinous old blood. Using pickups and scalpel I remove necrotic surface from around this area over the entire area, pulse fairly substantially inferiorly. We've been using silver alginate to both wound areas 10/14/17; patient still has substantial drainage from these areas although they actually look some better. The area on the right lateral leg is opened up and there is depth of this. The area on the left leg is larger but more superficial. This area hasn't bridged area of skin that I thought I would have to removed however the granulation as come up and I'm wondering whether we can simply watch this for now these both were originally hematomas from trauma. CULTURE I did last week showed Escherichia coli and Pseudomonas both sensitive to ciprofloxacin. This was called in for her to replace the doxycycline I prescribed empirically 10/21/17; patient is making reasonably good progress in both wound areas include using silver alginate. Paradoxically  the larger original wound area actually looks better. Ears no evidence of infection. 10/28/17; patient continues to make excellent progress in both wound areas using silver alginate. I have been pleasantly surprised by the progress. I note ABIs of 1.25 on the right and 1.24 on the left. Objective Constitutional Vitals Time Taken: 9:48 AM, Height: 61 in, Weight: 179.1 lbs, BMI: 33.8, Temperature: 97.7 F, Pulse: 63 bpm, Respiratory Rate: 18 breaths/min, Blood Pressure: 168/75 mmHg. Integumentary (Hair, Skin) Wound #1 status is Open. Original cause of wound was Trauma. The wound is located on the Right,Medial Lower Leg. The wound measures 1.9cm length x 2cm width x 0.7cm depth; 2.985cm^2 area and 2.089cm^3 volume. There is muscle, Fat Layer (Subcutaneous Tissue) Exposed, and fascia exposed.  There is no tunneling noted, however, there is undermining Weinkauf, Shayle M. ([id number]) starting at 6:00 and ending at 12:00 with a maximum distance of 0.7cm. There is a large amount of serosanguineous drainage noted. The wound margin is distinct with the outline attached to the wound base. There is medium (34-66%) red granulation within the wound bed. There is a medium (34-66%) amount of necrotic tissue within the wound bed including Adherent Slough. The periwound skin appearance exhibited: Scarring. Periwound temperature was noted as No Abnormality. The periwound has tenderness on palpation. Wound #2 status is Open. Original cause of wound was Trauma. The wound is located on the Left,Lateral Lower Leg. The wound measures 7cm length x 2.8cm width x 0.3cm depth; 15.394cm^2 area and 4.618cm^3 volume. There is muscle, Fat Layer (Subcutaneous Tissue) Exposed, and fascia exposed. There is no tunneling or undermining noted. There is a large amount of serosanguineous drainage noted. The wound margin is distinct with the outline attached to the wound base. There is medium (34-66%) red granulation within the wound  bed. There is a medium (34-66%) amount of necrotic tissue within the wound bed including Adherent Slough. The periwound skin appearance exhibited: Scarring. Periwound temperature was noted as No Abnormality. The periwound has tenderness on palpation. 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 There was a Skin/Subcutaneous Tissue Debridement (11042- 11047) debridement with total area of 3.8 sq cm performed by Ricard Dillon, MD. with the following instrument(s): Curette to remove Viable and Non-Viable tissue/material including Fat Layer (and Subcutaneous Tissue) Exposed, Fibrin/Slough, and Subcutaneous after achieving pain control using Other (lidocaione 4%). A time out was conducted at 10:20, prior to the start of the procedure. A Moderate amount of bleeding was controlled with Pressure. The procedure was tolerated well with a pain level of 2 throughout and a pain level of 2 following the procedure. Post Debridement Measurements: 1.9cm length x 2cm width x 0.8cm depth; 2.388cm^3 volume. Character of Wound/Ulcer Post Debridement requires further debridement. Severity of Tissue Post Debridement is: Fat layer exposed. Post procedure Diagnosis Wound #1: Same as Pre-Procedure Wound #2 Pre-procedure diagnosis of Wound #2 is a Diabetic Wound/Ulcer of the Lower Extremity located on the Left,Lateral Lower Leg .Severity of Tissue Pre Debridement is: Fat layer exposed. There was a  Skin/Subcutaneous Tissue Debridement (11042- 11047) debridement with total area of 9 sq cm performed by Ricard Dillon, MD. with the following instrument(s): Curette to remove Viable and Non-Viable tissue/material including Fat Layer (and Subcutaneous Tissue) Exposed, Fibrin/Slough, and Subcutaneous after achieving pain control using Other (lidocaione 4%). A time out was conducted at 10:20, prior to the start of the procedure. A Moderate amount of bleeding was controlled with Pressure. The procedure was tolerated well with a pain level of 2 throughout and a pain level of 2 following the procedure. Post Debridement Measurements: 7cm length x 2.8cm width x 0.4cm depth; 6.158cm^3 volume. Character of Wound/Ulcer Post Debridement requires further debridement. Severity of Tissue Post Debridement is: Fat layer Oberhaus, Bostyn M. ([id number]) exposed. Post procedure Diagnosis Wound #2: Same as Pre-Procedure Plan  Wound Cleansing: Wound #1 Right,Medial Lower Leg: Clean wound with Normal Saline. Cleanse wound with mild soap and water Wound #2 Left,Lateral Lower Leg: Clean wound with Normal Saline. Cleanse wound with mild soap and water Anesthetic (add to Medication List): Wound #1 Right,Medial Lower Leg: Topical Lidocaine 4% cream applied to wound bed prior to debridement (In Clinic Only). Hurricaine Topical Anesthetic Spray applied to wound bed prior to debridement (In Clinic Only). Wound #2 Left,Lateral Lower Leg: Topical Lidocaine 4% cream applied to wound bed prior to debridement (In Clinic Only). Hurricaine Topical Anesthetic Spray applied to wound bed prior to debridement (In Clinic Only). Primary Wound Dressing: Wound #1 Right,Medial Lower Leg: Silvercel Non-Adherent Wound #2 Left,Lateral Lower Leg: Silvercel Non-Adherent Secondary Dressing: Wound #1 Right,Medial Lower Leg: ABD pad Other - charcoal Wound #2 Left,Lateral Lower Leg: ABD pad Other - charcoal Dressing Change  Frequency: Wound #1 Right,Medial Lower Leg: Other: - twice a week Wound #2 Left,Lateral Lower Leg: Other: - twice a week Follow-up Appointments: Wound #1 Right,Medial Lower Leg: Return Appointment in 1 week. Nurse Visit as needed Wound #2 Left,Lateral Lower Leg: Return Appointment in 1 week. Nurse Visit as needed Edema Control: Wound #1 Right,Medial Lower Leg: Kerlix and Coban - Bilateral - unna to anchor Elevate legs to the level of the heart and pump ankles as often as possible Wound #2 Left,Lateral Lower Leg: Kerlix and Coban - Bilateral - unna to anchor Elevate legs to the level of the heart and pump ankles as often as possible Additional Orders / Instructions: Wound #1 Right,Medial Lower Leg: Increase protein intake. Other: - Vitamin C, Zinc [place], [place] ([id number]) Wound #2 Left,Lateral Lower Leg: Increase protein intake. Other: - Vitamin C, Zinc The following medication(s) was prescribed: lidocaine topical 4 % cream cream topical was prescribed at facility 1 doing surprisingly well 2 continue silver alginate/ABD/kerlix and coban Electronic Signature(s) Signed: 10/28/2017 5:05:40 PM By: Linton Ham MD Entered By: Linton Ham on 10/28/2017 10:37:36 Dufner, [name] ([id number]) -------------------------------------------------------------------------------- SuperBill Details Patient Name: [name]. Date of Service: 10/28/2017 Medical Record Number: [id number] Patient Account Number: [id number] Date of Birth/Sex: December 30, 1945 (72 y.o. Female) Treating RN: Cornell Barman Primary Care Provider: Viviana Simpler Other Clinician: Referring Provider: Viviana Simpler Treating Provider/Extender: Tito Dine in Treatment: 4 Diagnosis Coding ICD-10 Codes Code Description [phone] 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e L97.218 Non-pressure chronic ulcer of right calf with other specified severity E11.40 Type 2 diabetes mellitus  with diabetic neuropathy, unspecified I87.331 Chronic venous hypertension (idiopathic) with ulcer and inflammation of right lower extremity Facility Procedures CPT4 Code: [id number] Description: 79390 - DEB SUBQ TISSUE 20 SQ CM/< ICD-10 Diagnosis Description 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 Quantity: 1 Physician Procedures CPT4 Code: [id number] Description: 00762 - WC PHYS SUBQ TISS 20 SQ CM ICD-10 Diagnosis Description 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 Quantity: 1 Electronic Signature(s) Signed: 10/28/2017 5:05:40 PM By: Linton Ham MD Entered By: Linton Ham on 10/28/2017 10:38:03

## 2017-10-31 NOTE — Progress Notes (Signed)
HENLEY, BLYTH ([id number]) Visit Report for 10/28/2017 Arrival Information Details Patient Name: [name], [name]. Date of Service: 10/28/2017 10:15 AM Medical Record Number: [id number] Patient Account Number: [id number] Date of Birth/Sex: 09-27-1945 (72 y.o. Female) Treating RN: Carolyne Fiscal, Debi Primary Care Sherran Margolis: Viviana Simpler Other Clinician: Referring Mileah Hemmer: Viviana Simpler Treating Keri Veale/Extender: Tito Dine in Treatment: 4 Visit Information History Since Last Visit All ordered tests and consults were completed: No Patient Arrived: Ambulatory Added or deleted any medications: No Arrival Time: 09:47 Any new allergies or adverse reactions: No Accompanied By: self Had a fall or experienced change in No Transfer Assistance: None activities of daily living that may affect Patient Identification Verified: Yes risk of falls: Secondary Verification Process Completed: Yes Signs or symptoms of abuse/neglect since last visito No Patient Requires Transmission-Based No Hospitalized since last visit: No Precautions: Has Dressing in Place as Prescribed: Yes Patient Has Alerts: Yes Has Compression in Place as Prescribed: Yes Patient Alerts: DM II Pain Present Now: No Electronic Signature(s) Signed: 10/30/2017 8:00:35 AM By: Alric Quan Entered By: Alric Quan on 10/28/2017 09:48:45 [name], [name] ([id number]) -------------------------------------------------------------------------------- Encounter Discharge Information Details Patient Name: [name]. Date of Service: 10/28/2017 10:15 AM Medical Record Number: [id number] Patient Account Number: [id number] Date of Birth/Sex: 06/23/1946 (72 y.o. Female) Treating RN: Cornell Barman Primary Care Kaitlyn Franko: Viviana Simpler Other Clinician: Referring Kalia Vahey: Viviana Simpler Treating Evens Meno/Extender: Tito Dine in Treatment: 4 Encounter Discharge Information Items Schedule Follow-up  Appointment: No Medication Reconciliation completed and No provided to Patient/Care Avaley Coop: Provided on Clinical Summary of Care: 10/28/2017 Form Type Recipient Paper Patient LK Electronic Signature(s) Signed: 10/30/2017 8:16:02 AM By: Ruthine Dose Entered By: Ruthine Dose on 10/28/2017 10:43:01 [name] ([id number]) -------------------------------------------------------------------------------- Lower Extremity Assessment Details Patient Name: [name]. Date of Service: 10/28/2017 10:15 AM Medical Record Number: [id number] Patient Account Number: [id number] Date of Birth/Sex: [birth year] (72 y.o. Female) Treating RN: Carolyne Fiscal, Debi Primary Care Linzie Boursiquot: Viviana Simpler Other Clinician: Referring Saif Peter: Viviana Simpler Treating Keagan Brislin/Extender: Tito Dine in Treatment: 4 Edema Assessment Assessed: [Left: No] [Right: No] [Left: Edema] [Right: :] Calf Left: Right: [name] of Measurement: 34 cm From Medial Instep 34.6 cm 34.6 cm Ankle Left: Right: [name] of Measurement: 9 cm From Medial Instep 23.6 cm 23.2 cm Vascular Assessment Pulses: Dorsalis Pedis Palpable: [Left:Yes] [Right:Yes] Posterior Tibial Extremity colors, hair growth, and conditions: Extremity Color: [Left:Mottled] [Right:Mottled] Temperature of Extremity: [Left:Warm] [Right:Warm] Capillary Refill: [Left:< 3 seconds] [Right:< 3 seconds] Blood Pressure: Brachial: [Left:144] [Right:130] Dorsalis Pedis: 160 [Left:Dorsalis Pedis: 160] Ankle: Posterior Tibial: 180 [Left:Posterior Tibial: 178 1.25] [Right:1.24] Toe Nail Assessment Left: Right: Thick: Yes Yes Discolored: No No Deformed: No No Improper Length and Hygiene: Yes Yes Electronic Signature(s) Signed: 10/30/2017 8:00:35 AM By: Alric Quan Entered By: Alric Quan on 10/28/2017 10:20:44 [name], [name] ([id number]) -------------------------------------------------------------------------------- Multi Wound  Chart Details Patient Name: [name]. Date of Service: 10/28/2017 10:15 AM Medical Record Number: [id number] Patient Account Number: [id number] Date of Birth/Sex: 10-19-1945 (72 y.o. Female) Treating RN: Cornell Barman Primary Care Nailea Whitehorn: Viviana Simpler Other Clinician: Referring Vondra Aldredge: Viviana Simpler Treating Rayana Geurin/Extender: Tito Dine in Treatment: 4 Vital Signs Height(in): 61 Pulse(bpm): 50 Weight(lbs): 179.1 Blood Pressure(mmHg): 168/75 Body Mass Index(BMI): 34 Temperature(F): 97.7 Respiratory Rate 18 (breaths/min): Photos: [1:No Photos] [2:No Photos] [N/A:N/A] Wound Location: [1:Right Lower Leg - Medial] [2:Left Lower Leg - Lateral] [N/A:N/A] Wounding Event: [1:Trauma] [2:Trauma] [N/A:N/A] Primary Etiology: [1:Diabetic Wound/Ulcer of the Lower Extremity] [2:Diabetic Wound/Ulcer of the  Lower Extremity] [N/A:N/A] Secondary Etiology: [1:Trauma, Other] [2:Trauma, Other] [N/A:N/A] Comorbid History: [1:Hypertension, Type II Diabetes, Osteoarthritis, Neuropathy] [2:Hypertension, Type II Diabetes, Osteoarthritis, Neuropathy] [N/A:N/A] Date Acquired: [1:08/30/2017] [2:08/30/2017] [N/A:N/A] Weeks of Treatment: [1:4] [2:4] [N/A:N/A] Wound Status: [1:Open] [2:Open] [N/A:N/A] Measurements L x W x D [1:1.9x2x0.7] [2:7x2.8x0.3] [N/A:N/A] (cm) Area (cm) : [1:2.985] [2:15.394] [N/A:N/A] Volume (cm) : [1:2.089] [3:1.517] [N/A:N/A] % Reduction in Area: [1:63.80%] [2:22.20%] [N/A:N/A] % Reduction in Volume: [1:-153.20%] [2:66.70%] [N/A:N/A] Starting Position 1 [1:6] (o'clock): Ending Position 1 [1:12] (o'clock): Maximum Distance 1 (cm): [1:0.7] Undermining: [1:Yes] [2:No] [N/A:N/A] Classification: [1:Grade 2] [2:Grade 2] [N/A:N/A] Exudate Amount: [1:Large] [2:Large] [N/A:N/A] Exudate Type: [1:Serosanguineous] [2:Serosanguineous] [N/A:N/A] Exudate Color: [1:red, brown] [2:red, brown] [N/A:N/A] Wound Margin: [1:Distinct, outline attached] [2:Distinct,  outline attached] [N/A:N/A] Granulation Amount: [1:Medium (34-66%)] [2:Medium (34-66%)] [N/A:N/A] Granulation Quality: [1:Red] [2:Red] [N/A:N/A] Necrotic Amount: [1:Medium (34-66%)] [2:Medium (34-66%)] [N/A:N/A] Exposed Structures: [1:Fascia: Yes Fat Layer (Subcutaneous Tissue) Exposed: Yes Muscle: Yes] [2:Fascia: Yes Fat Layer (Subcutaneous Tissue) Exposed: Yes Muscle: Yes Tendon: No] [N/A:N/A] Epithelialization: N/A None N/A Debridement: Debridement ([id number]) Debridement ([id number]) N/A Pre-procedure 10:20 10:20 N/A Verification/Time Out Taken: Pain Control: Other Other N/A Tissue Debrided: Fibrin/Slough, Fat, Fibrin/Slough, Fat, N/A Subcutaneous Subcutaneous Level: Skin/Subcutaneous Tissue Skin/Subcutaneous Tissue N/A Debridement Area (sq cm): 3.8 9 N/A Instrument: Curette Curette N/A Bleeding: Moderate Moderate N/A Hemostasis Achieved: Pressure Pressure N/A Procedural Pain: 2 2 N/A Post Procedural Pain: 2 2 N/A Debridement Treatment Procedure was tolerated well Procedure was tolerated well N/A Response: Post Debridement 1.9x2x0.8 7x2.8x0.4 N/A Measurements L x W x D (cm) Post Debridement Volume: 2.388 6.158 N/A (cm) Periwound Skin Texture: Scarring: Yes Scarring: Yes N/A Periwound Skin Moisture: No Abnormalities Noted No Abnormalities Noted N/A Periwound Skin Color: No Abnormalities Noted No Abnormalities Noted N/A Temperature: No Abnormality No Abnormality N/A Tenderness on Palpation: Yes Yes N/A Wound Preparation: Ulcer Cleansing: Ulcer Cleansing: N/A Rinsed/Irrigated with Saline, Rinsed/Irrigated with Saline, Other: soap and water Other: soap and water Topical Anesthetic Applied: Topical Anesthetic Applied: Other: lidocaine 4% Other: lidocaine 4% Procedures Performed: Debridement Debridement N/A Treatment Notes Electronic Signature(s) Signed: 10/28/2017 5:05:40 PM By: Linton Ham MD Entered By: Linton Ham on 10/28/2017 10:32:09 [name]  ([id number]) -------------------------------------------------------------------------------- [place] Details Patient Name: [name]. Date of Service: 10/28/2017 10:15 AM Medical Record Number: [id number] Patient Account Number: [id number] Date of Birth/Sex: [birth year] (72 y.o. Female) Treating RN: Cornell Barman Primary Care Magali Bray: Viviana Simpler Other Clinician: Referring Densel Kronick: Viviana Simpler Treating Marvion Bastidas/Extender: Tito Dine in Treatment: 4 Active Inactive ` Abuse / Safety / Falls / Self Care Management Nursing Diagnoses: History of Falls Potential for falls Goals: Patient will not experience any injury related to falls Date Initiated: 09/30/2017 Target Resolution Date: 01/16/2018 Goal Status: Active Interventions: Assess Activities of Daily Living upon admission and as needed Assess fall risk on admission and as needed Assess: immobility, friction, shearing, incontinence upon admission and as needed Assess impairment of mobility on admission and as needed per policy Notes: ` Nutrition Nursing Diagnoses: Imbalanced nutrition Impaired glucose control: actual or potential Potential for alteratiion in Nutrition/Potential for imbalanced nutrition Goals: Patient/caregiver agrees to and verbalizes understanding of need to use nutritional supplements and/or vitamins as prescribed Date Initiated: 09/30/2017 Target Resolution Date: 01/16/2018 Goal Status: Active Patient/caregiver will maintain therapeutic glucose control Date Initiated: 09/30/2017 Target Resolution Date: 01/16/2018 Goal Status: Active Interventions: Assess patient nutrition upon admission and as needed per policy Provide education on elevated blood sugars and impact on wound healing Notes: ` [name], [name] (  [id number]) Orientation to the Wound Care Program Nursing Diagnoses: Knowledge deficit related to the wound healing center  program Goals: Patient/caregiver will verbalize understanding of the [place] Program Date Initiated: 09/30/2017 Target Resolution Date: 10/17/2017 Goal Status: Active Interventions: Provide education on orientation to the wound center Notes: ` Pain, Acute or Chronic Nursing Diagnoses: Pain, acute or chronic: actual or potential Potential alteration in comfort, pain Goals: Patient/caregiver will verbalize adequate pain control between visits Date Initiated: 09/30/2017 Target Resolution Date: 01/16/2018 Goal Status: Active Interventions: Complete pain assessment as per visit requirements Notes: ` Wound/Skin Impairment Nursing Diagnoses: Impaired tissue integrity Knowledge deficit related to ulceration/compromised skin integrity Goals: Ulcer/skin breakdown will have a volume reduction of 80% by week 12 Date Initiated: 09/30/2017 Target Resolution Date: 01/16/2018 Goal Status: Active Interventions: Assess patient/caregiver ability to perform ulcer/skin care regimen upon admission and as needed Assess ulceration(s) every visit Notes: Electronic Signature(s) Signed: 10/28/2017 5:24:42 PM By: Gretta Cool, BSN, RN, CWS, Kim RN, BSN Entered By: Gretta Cool, BSN, RN, CWS, Kim on 10/28/2017 10:23:07 [name] ([id number]) [name], [name] ([id number]) -------------------------------------------------------------------------------- Pain Assessment Details Patient Name: [name]. Date of Service: 10/28/2017 10:15 AM Medical Record Number: [id number] Patient Account Number: [id number] Date of Birth/Sex: [birth year] (72 y.o. Female) Treating RN: Carolyne Fiscal, Debi Primary Care Fianna Snowball: Viviana Simpler Other Clinician: Referring Zebulen Simonis: Viviana Simpler Treating Rethel Sebek/Extender: Tito Dine in Treatment: 4 Active Problems Location of Pain Severity and Description of Pain Patient Has Paino No Site Locations Pain Management and Medication Current Pain  Management: Electronic Signature(s) Signed: 10/30/2017 8:00:35 AM By: Alric Quan Entered By: Alric Quan on 10/28/2017 09:48:51 [name], [name] ([id number]) -------------------------------------------------------------------------------- Wound Assessment Details Patient Name: [name]. Date of Service: 10/28/2017 10:15 AM Medical Record Number: [id number] Patient Account Number: [id number] Date of Birth/Sex: 15-Mar-1946 (73 y.o. Female) Treating RN: Carolyne Fiscal, Debi Primary Care Radwan Cowley: Viviana Simpler Other Clinician: Referring Yalexa Blust: Viviana Simpler Treating Jodell Weitman/Extender: Tito Dine in Treatment: 4 Wound Status Wound Number: 1 Primary Etiology: Diabetic Wound/Ulcer of the Lower Extremity Wound Location: Right Lower Leg - Medial Secondary Trauma, Other Wounding Event: Trauma Etiology: Date Acquired: 08/30/2017 Wound Status: Open Weeks Of Treatment: 4 Comorbid Hypertension, Type II Diabetes, Clustered Wound: No History: Osteoarthritis, Neuropathy Photos Photo Uploaded By: Alric Quan on 10/28/2017 10:42:24 Wound Measurements Length: (cm) 1.9 Width: (cm) 2 Depth: (cm) 0.7 Area: (cm) 2.985 Volume: (cm) 2.089 % Reduction in Area: 63.8% % Reduction in Volume: -153.2% Tunneling: No Undermining: Yes Starting Position (o'clock): 6 Ending Position (o'clock): 12 Maximum Distance: (cm) 0.7 Wound Description Classification: Grade 2 Wound Margin: Distinct, outline attached Exudate Amount: Large Exudate Type: Serosanguineous Exudate Color: red, brown Foul Odor After Cleansing: No Slough/Fibrino Yes Wound Bed Granulation Amount: Medium (34-66%) Exposed Structure Granulation Quality: Red Fascia Exposed: Yes Necrotic Amount: Medium (34-66%) Fat Layer (Subcutaneous Tissue) Exposed: Yes Necrotic Quality: Adherent Slough Muscle Exposed: Yes Necrosis of Muscle: No [name], [name] ([id number]) Periwound Skin Texture Texture  Color No Abnormalities Noted: No No Abnormalities Noted: No Scarring: Yes Temperature / Pain Moisture Temperature: No Abnormality No Abnormalities Noted: No Tenderness on Palpation: Yes Wound Preparation Ulcer Cleansing: Rinsed/Irrigated with Saline, Other: soap and water, Topical Anesthetic Applied: Other: lidocaine 4%, Electronic Signature(s) Signed: 10/30/2017 8:00:35 AM By: Alric Quan Entered By: Alric Quan on 10/28/2017 10:05:34 [name] ([id number]) -------------------------------------------------------------------------------- Wound Assessment Details Patient Name: [name]. Date of Service: 10/28/2017 10:15 AM Medical Record Number: [id number] Patient Account Number: [id number] Date of Birth/Sex: 1946/08/25 (71  y.o. Female) Treating RN: Carolyne Fiscal, Debi Primary Care Marleny Faller: Viviana Simpler Other Clinician: Referring Arval Brandstetter: Viviana Simpler Treating Kordel Leavy/Extender: Tito Dine in Treatment: 4 Wound Status Wound Number: 2 Primary Etiology: Diabetic Wound/Ulcer of the Lower Extremity Wound Location: Left Lower Leg - Lateral Secondary Trauma, Other Wounding Event: Trauma Etiology: Date Acquired: 08/30/2017 Wound Status: Open Weeks Of Treatment: 4 Comorbid Hypertension, Type II Diabetes, Clustered Wound: No History: Osteoarthritis, Neuropathy Photos Photo Uploaded By: Alric Quan on 10/28/2017 10:42:24 Wound Measurements Length: (cm) 7 Width: (cm) 2.8 Depth: (cm) 0.3 Area: (cm) 15.394 Volume: (cm) 4.618 % Reduction in Area: 22.2% % Reduction in Volume: 66.7% Epithelialization: None Tunneling: No Undermining: No Wound Description Classification: Grade 2 Wound Margin: Distinct, outline attached Exudate Amount: Large Exudate Type: Serosanguineous Exudate Color: red, brown Foul Odor After Cleansing: No Slough/Fibrino Yes Wound Bed Granulation Amount: Medium (34-66%) Exposed Structure Granulation  Quality: Red Fascia Exposed: Yes Necrotic Amount: Medium (34-66%) Fat Layer (Subcutaneous Tissue) Exposed: Yes Necrotic Quality: Adherent Slough Tendon Exposed: No Muscle Exposed: Yes Necrosis of Muscle: No Periwound Skin Texture Texture Color [name], [name] ([id number]) No Abnormalities Noted: No No Abnormalities Noted: No Scarring: Yes Temperature / Pain Moisture Temperature: No Abnormality No Abnormalities Noted: No Tenderness on Palpation: Yes Wound Preparation Ulcer Cleansing: Rinsed/Irrigated with Saline, Other: soap and water, Topical Anesthetic Applied: Other: lidocaine 4%, Electronic Signature(s) Signed: 10/30/2017 8:00:35 AM By: Alric Quan Entered By: Alric Quan on 10/28/2017 10:02:29 [name] ([id number]) -------------------------------------------------------------------------------- Vitals Details Patient Name: [name]. Date of Service: 10/28/2017 10:15 AM Medical Record Number: [id number] Patient Account Number: [id number] Date of Birth/Sex: [birth year] (72 y.o. Female) Treating RN: Carolyne Fiscal, Debi Primary Care Zhuri Krass: Viviana Simpler Other Clinician: Referring Quin Mathenia: Viviana Simpler Treating Kaylyn Garrow/Extender: Tito Dine in Treatment: 4 Vital Signs Time Taken: 09:48 Temperature (F): 97.7 Height (in): 61 Pulse (bpm): 63 Weight (lbs): 179.1 Respiratory Rate (breaths/min): 18 Body Mass Index (BMI): 33.8 Blood Pressure (mmHg): 168/75 Reference Range: 80 - 120 mg / dl Electronic Signature(s) Signed: 10/30/2017 8:00:35 AM By: Alric Quan Entered By: Alric Quan on 10/28/2017 09:52:22

## 2017-11-04 ENCOUNTER — Encounter: Payer: Medicare Other | Admitting: Internal Medicine

## 2017-11-04 DIAGNOSIS — E1140 Type 2 diabetes mellitus with diabetic neuropathy, unspecified: Secondary | ICD-10-CM | POA: Diagnosis not present

## 2017-11-04 DIAGNOSIS — S81801A Unspecified open wound, right lower leg, initial encounter: Secondary | ICD-10-CM | POA: Diagnosis not present

## 2017-11-04 DIAGNOSIS — L97223 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e: Secondary | ICD-10-CM | POA: Diagnosis not present

## 2017-11-04 DIAGNOSIS — L97218 Non-pressure chronic ulcer of right calf with other specified severity: Secondary | ICD-10-CM | POA: Diagnosis not present

## 2017-11-04 DIAGNOSIS — E11622 Type 2 diabetes mellitus with other skin ulcer: Secondary | ICD-10-CM | POA: Diagnosis not present

## 2017-11-04 DIAGNOSIS — E1151 Type 2 diabetes mellitus with diabetic peripheral angiopathy without gangrene: Secondary | ICD-10-CM | POA: Diagnosis not present

## 2017-11-04 DIAGNOSIS — I87331 Chronic venous hypertension (idiopathic) with ulcer and inflammation of right lower extremity: Secondary | ICD-10-CM | POA: Diagnosis not present

## 2017-11-04 DIAGNOSIS — S81802A Unspecified open wound, left lower leg, initial encounter: Secondary | ICD-10-CM | POA: Diagnosis not present

## 2017-11-04 DIAGNOSIS — I10 Essential (primary) hypertension: Secondary | ICD-10-CM | POA: Diagnosis not present

## 2017-11-05 NOTE — Progress Notes (Signed)
KALEESI, GUYTON ([id number]) Visit Report for 11/04/2017 Arrival Information Details Patient Name: [name], [name]. Date of Service: 11/04/2017 1:00 PM Medical Record Number: [id number] Patient Account Number: [id number] Date of Birth/Sex: 1946/02/09 (71 y.o. Female) Treating RN: Montey Hora Primary Care [place] Mccuen: Viviana Simpler Other Clinician: Referring Lanisha Stepanian: Viviana Simpler Treating Cordai Rodrigue/Extender: Tito Dine in Treatment: 5 Visit Information History Since Last Visit Added or deleted any medications: No Patient Arrived: Cane Any new allergies or adverse reactions: No Arrival Time: 13:21 Had a fall or experienced change in No Accompanied By: self activities of daily living that may affect Transfer Assistance: None risk of falls: Patient Identification Verified: Yes Signs or symptoms of abuse/neglect since last visito No Secondary Verification Process Completed: Yes Hospitalized since last visit: No Patient Requires Transmission-Based Precautions: No Has Dressing in Place as Prescribed: Yes Patient Has Alerts: Yes Pain Present Now: No Patient Alerts: DM II Electronic Signature(s) Signed: 11/04/2017 4:38:39 PM By: Montey Hora Entered By: Montey Hora on 11/04/2017 13:22:12 [name] ([id number]) -------------------------------------------------------------------------------- Clinic Level of Care Assessment Details Patient Name: [name]. Date of Service: 11/04/2017 1:00 PM Medical Record Number: [id number] Patient Account Number: [id number] Date of Birth/Sex: [birth year] (71 y.o. Female) Treating RN: Cornell Barman Primary Care Anaika Santillano: Viviana Simpler Other Clinician: Referring Naresh Althaus: Viviana Simpler Treating Castor Gittleman/Extender: Tito Dine in Treatment: 5 Clinic Level of Care Assessment Items TOOL 4 Quantity Score []  - Use when only an EandM is performed on FOLLOW-UP visit 0 ASSESSMENTS - Nursing Assessment /  Reassessment []  - Reassessment of Co-morbidities (includes updates in patient status) 0 []  - 0 Reassessment of Adherence to Treatment Plan ASSESSMENTS - Wound and Skin Assessment / Reassessment X - Simple Wound Assessment / Reassessment - one wound 1 5 []  - 0 Complex Wound Assessment / Reassessment - multiple wounds []  - 0 Dermatologic / Skin Assessment (not related to wound area) ASSESSMENTS - Focused Assessment []  - Circumferential Edema Measurements - multi extremities 0 []  - 0 Nutritional Assessment / Counseling / Intervention []  - 0 Lower Extremity Assessment (monofilament, tuning fork, pulses) []  - 0 Peripheral Arterial Disease Assessment (using hand held doppler) ASSESSMENTS - Ostomy and/or Continence Assessment and Care []  - Incontinence Assessment and Management 0 []  - 0 Ostomy Care Assessment and Management (repouching, etc.) PROCESS - Coordination of Care X - Simple Patient / Family Education for ongoing care 1 15 []  - 0 Complex (extensive) Patient / Family Education for ongoing care []  - 0 Staff obtains Programmer, systems, Records, Test Results / Process Orders []  - 0 Staff telephones HHA, Nursing Homes / Clarify orders / etc []  - 0 Routine Transfer to another Facility (non-emergent condition) []  - 0 Routine Hospital Admission (non-emergent condition) []  - 0 New Admissions / Biomedical engineer / Ordering NPWT, Apligraf, etc. []  - 0 Emergency Hospital Admission (emergent condition) X- 1 10 Simple Discharge Coordination [name], CONRY. ([id number]) []  - 0 Complex (extensive) Discharge Coordination PROCESS - Special Needs []  - Pediatric / Minor Patient Management 0 []  - 0 Isolation Patient Management []  - 0 Hearing / Language / Visual special needs []  - 0 Assessment of Community assistance (transportation, D/C planning, etc.) []  - 0 Additional assistance / Altered mentation []  - 0 Support Surface(s) Assessment (bed, cushion, seat, etc.) INTERVENTIONS -  Wound Cleansing / Measurement X - Simple Wound Cleansing - one wound 1 5 []  - 0 Complex Wound Cleansing - multiple wounds X- 1 5 Wound Imaging (photographs - any number of wounds) []  -  0 Wound Tracing (instead of photographs) X- 1 5 Simple Wound Measurement - one wound []  - 0 Complex Wound Measurement - multiple wounds INTERVENTIONS - Wound Dressings []  - Small Wound Dressing one or multiple wounds 0 []  - 0 Medium Wound Dressing one or multiple wounds X- 2 20 Large Wound Dressing one or multiple wounds []  - 0 Application of Medications - topical []  - 0 Application of Medications - injection INTERVENTIONS - Miscellaneous []  - External ear exam 0 []  - 0 Specimen Collection (cultures, biopsies, blood, body fluids, etc.) []  - 0 Specimen(s) / Culture(s) sent or taken to Lab for analysis []  - 0 Patient Transfer (multiple staff / Civil Service fast streamer / Similar devices) []  - 0 Simple Staple / Suture removal (25 or less) []  - 0 Complex Staple / Suture removal (26 or more) []  - 0 Hypo / Hyperglycemic Management (close monitor of Blood Glucose) []  - 0 Ankle / Brachial Index (ABI) - do not check if billed separately X- 1 5 Vital Signs Boulais, Nicholas M. ([id number]) Has the patient been seen at the hospital within the last three years: Yes Total Score: 90 Level Of Care: New/Established - Level 3 Electronic Signature(s) Signed: 11/04/2017 4:57:19 PM By: Gretta Cool, BSN, RN, CWS, Kim RN, BSN Entered By: Gretta Cool, BSN, RN, CWS, Kim on 11/04/2017 13:58:27 Fosdick, [name] ([id number]) -------------------------------------------------------------------------------- Encounter Discharge Information Details Patient Name: [name]. Date of Service: 11/04/2017 1:00 PM Medical Record Number: [id number] Patient Account Number: [id number] Date of Birth/Sex: 1946/02/16 (71 y.o. Female) Treating RN: Montey Hora Primary Care Temple Ewart: Viviana Simpler Other Clinician: Referring Shrita Thien: Viviana Simpler Treating Brelynn Wheller/Extender: Tito Dine in Treatment: 5 Encounter Discharge Information Items Discharge Pain Level: 0 Discharge Condition: Stable Ambulatory Status: Cane Discharge Destination: Home Private Transportation: Auto Accompanied By: self Schedule Follow-up Appointment: Yes Medication Reconciliation completed and provided No to Patient/Care Robyn Nohr: Clinical Summary of Care: Electronic Signature(s) Signed: 11/04/2017 4:38:39 PM By: Montey Hora Entered By: Montey Hora on 11/04/2017 14:19:04 Ramnauth, [name] ([id number]) -------------------------------------------------------------------------------- Lower Extremity Assessment Details Patient Name: [name]. Date of Service: 11/04/2017 1:00 PM Medical Record Number: [id number] Patient Account Number: [id number] Date of Birth/Sex: [birth year] (71 y.o. Female) Treating RN: Montey Hora Primary Care Nathanial Arrighi: Viviana Simpler Other Clinician: Referring Sedra Morfin: Viviana Simpler Treating Zuha Dejonge/Extender: Tito Dine in Treatment: 5 Edema Assessment Assessed: [Left: No] [Right: No] [Left: Edema] [Right: :] Calf Left: Right: Point of Measurement: 34 cm From Medial Instep 35 cm 35 cm Ankle Left: Right: Point of Measurement: 9 cm From Medial Instep 23.7 cm 23.5 cm Vascular Assessment Pulses: Dorsalis Pedis Palpable: [Left:Yes] [Right:Yes] Posterior Tibial Extremity colors, hair growth, and conditions: Extremity Color: [Left:Hyperpigmented] [Right:Hyperpigmented] Temperature of Extremity: [Left:Warm] [Right:Warm] Capillary Refill: [Left:< 3 seconds] [Right:< 3 seconds] Toe Nail Assessment Left: Right: Thick: Yes Yes Discolored: No No Deformed: No No Improper Length and Hygiene: Yes Yes Electronic Signature(s) Signed: 11/04/2017 4:38:39 PM By: Montey Hora Entered By: Montey Hora on 11/04/2017 13:37:10 [place], [name]  ([id number]) -------------------------------------------------------------------------------- Multi Wound Chart Details Patient Name: [name]. Date of Service: 11/04/2017 1:00 PM Medical Record Number: [id number] Patient Account Number: [id number] Date of Birth/Sex: 1946-04-08 (71 y.o. Female) Treating RN: Cornell Barman Primary Care Owin Vignola: Viviana Simpler Other Clinician: Referring Irvin Lizama: Viviana Simpler Treating Lynell Greenhouse/Extender: Tito Dine in Treatment: 5 Vital Signs Height(in): 61 Pulse(bpm): 93 Weight(lbs): 179.1 Blood Pressure(mmHg): 147/82 Body Mass Index(BMI): 34 Temperature(F): 97.8 Respiratory Rate 18 (breaths/min): Photos: [N/A:N/A] Wound Location: Right Lower Leg -  Medial Left Lower Leg - Lateral N/A Wounding Event: Trauma Trauma N/A Primary Etiology: Diabetic Wound/Ulcer of the Diabetic Wound/Ulcer of the N/A Lower Extremity Lower Extremity Secondary Etiology: Trauma, Other Trauma, Other N/A Comorbid History: Hypertension, Type II Hypertension, Type II N/A Diabetes, Osteoarthritis, Diabetes, Osteoarthritis, Neuropathy Neuropathy Date Acquired: 08/30/2017 08/30/2017 N/A Weeks of Treatment: 5 5 N/A Wound Status: Open Open N/A Measurements L x W x D 1.8x1.7x0.5 6.4x1.9x0.3 N/A (cm) Area (cm) : 2.403 9.55 N/A Volume (cm) : 1.202 2.865 N/A % Reduction in Area: 70.90% 51.70% N/A % Reduction in Volume: -45.70% 79.30% N/A Starting Position 1 8 (o'clock): Ending Position 1 11 (o'clock): Maximum Distance 1 (cm): 0.4 Undermining: Yes No N/A Classification: Grade 2 Grade 2 N/A Exudate Amount: Large Large N/A Exudate Type: Serosanguineous Serosanguineous N/A Exudate Color: red, brown red, brown N/A Wound Margin: Distinct, outline attached Distinct, outline attached N/A Granulation Amount: Medium (34-66%) Small (1-33%) N/A Leiter, Davinity M. ([id number]) Granulation Quality: Red Red N/A Necrotic Amount: Medium (34-66%) Large (67-100%)  N/A Exposed Structures: Fascia: Yes Fascia: Yes N/A Fat Layer (Subcutaneous Fat Layer (Subcutaneous Tissue) Exposed: Yes Tissue) Exposed: Yes Muscle: Yes Muscle: Yes Tendon: No Epithelialization: None None N/A Periwound Skin Texture: Scarring: Yes Scarring: Yes N/A Periwound Skin Moisture: No Abnormalities Noted No Abnormalities Noted N/A Periwound Skin Color: No Abnormalities Noted No Abnormalities Noted N/A Temperature: No Abnormality No Abnormality N/A Tenderness on Palpation: Yes Yes N/A Wound Preparation: Ulcer Cleansing: Ulcer Cleansing: N/A Rinsed/Irrigated with Saline, Rinsed/Irrigated with Saline, Other: soap and water Other: soap and water Topical Anesthetic Applied: Topical Anesthetic Applied: Other: lidocaine 4% Other: lidocaine 4% Treatment Notes Electronic Signature(s) Signed: 11/04/2017 5:07:14 PM By: Linton Ham MD Entered By: Linton Ham on 11/04/2017 14:14:03 [place], [name] ([id number]) -------------------------------------------------------------------------------- [place] Details Patient Name: [name]. Date of Service: 11/04/2017 1:00 PM Medical Record Number: [id number] Patient Account Number: [id number] Date of Birth/Sex: 26-Nov-1945 (71 y.o. Female) Treating RN: Cornell Barman Primary Care Ector Laurel: Viviana Simpler Other Clinician: Referring Cane Dubray: Viviana Simpler Treating Daishawn Lauf/Extender: Tito Dine in Treatment: 5 Active Inactive ` Abuse / Safety / Falls / Self Care Management Nursing Diagnoses: History of Falls Potential for falls Goals: Patient will not experience any injury related to falls Date Initiated: 09/30/2017 Target Resolution Date: 01/16/2018 Goal Status: Active Interventions: Assess Activities of Daily Living upon admission and as needed Assess fall risk on admission and as needed Assess: immobility, friction, shearing, incontinence upon admission and as needed Assess  impairment of mobility on admission and as needed per policy Notes: ` Nutrition Nursing Diagnoses: Imbalanced nutrition Impaired glucose control: actual or potential Potential for alteratiion in Nutrition/Potential for imbalanced nutrition Goals: Patient/caregiver agrees to and verbalizes understanding of need to use nutritional supplements and/or vitamins as prescribed Date Initiated: 09/30/2017 Target Resolution Date: 01/16/2018 Goal Status: Active Patient/caregiver will maintain therapeutic glucose control Date Initiated: 09/30/2017 Target Resolution Date: 01/16/2018 Goal Status: Active Interventions: Assess patient nutrition upon admission and as needed per policy Provide education on elevated blood sugars and impact on wound healing Notes: [name], [name] ([id number]) Orientation to the Wound Care Program Nursing Diagnoses: Knowledge deficit related to the wound healing center program Goals: Patient/caregiver will verbalize understanding of the [place] Date Initiated: 09/30/2017 Target Resolution Date: 10/17/2017 Goal Status: Active Interventions: Provide education on orientation to the wound center Notes: ` Pain, Acute or Chronic Nursing Diagnoses: Pain, acute or chronic: actual or potential Potential alteration in comfort, pain Goals: Patient/caregiver will verbalize  adequate pain control between visits Date Initiated: 09/30/2017 Target Resolution Date: 01/16/2018 Goal Status: Active Interventions: Complete pain assessment as per visit requirements Notes: ` Wound/Skin Impairment Nursing Diagnoses: Impaired tissue integrity Knowledge deficit related to ulceration/compromised skin integrity Goals: Ulcer/skin breakdown will have a volume reduction of 80% by week 12 Date Initiated: 09/30/2017 Target Resolution Date: 01/16/2018 Goal Status: Active Interventions: Assess patient/caregiver ability to perform ulcer/skin care regimen upon admission  and as needed Assess ulceration(s) every visit Notes: Electronic Signature(s) Signed: 11/04/2017 4:57:19 PM By: Gretta Cool, BSN, RN, CWS, Kim RN, BSN Entered By: Gretta Cool, BSN, RN, CWS, Kim on 11/04/2017 13:54:09 Hetzer, [name] ([id number]) [address] [place] M. ([id number]) -------------------------------------------------------------------------------- Pain Assessment Details Patient Name: [name]. Date of Service: 11/04/2017 1:00 PM Medical Record Number: [id number] Patient Account Number: [id number] Date of Birth/Sex: 11/06/1945 (71 y.o. Female) Treating RN: Montey Hora Primary Care Cortez Steelman: Viviana Simpler Other Clinician: Referring Janise Gora: Viviana Simpler Treating Corinthian Kemler/Extender: Tito Dine in Treatment: 5 Active Problems Location of Pain Severity and Description of Pain Patient Has Paino No Site Locations Pain Management and Medication Current Pain Management: Electronic Signature(s) Signed: 11/04/2017 4:38:39 PM By: Montey Hora Entered By: Montey Hora on 11/04/2017 13:22:38 [name] ([id number]) -------------------------------------------------------------------------------- Patient/Caregiver Education Details Patient Name: [name]. Date of Service: 11/04/2017 1:00 PM Medical Record Number: [id number] Patient Account Number: [id number] Date of Birth/Gender: [birth year] (71 y.o. Female) Treating RN: Montey Hora Primary Care Physician: Viviana Simpler Other Clinician: Referring Physician: Viviana Simpler Treating Physician/Extender: Tito Dine in Treatment: 5 Education Assessment Education Provided To: Patient Education Topics Provided Venous: Handouts: Other: leg elevation Methods: Explain/Verbal Responses: State content correctly Electronic Signature(s) Signed: 11/04/2017 4:38:39 PM By: Montey Hora Entered By: Montey Hora on 11/04/2017 14:19:22 Woloszyn, [name]  ([id number]) -------------------------------------------------------------------------------- Wound Assessment Details Patient Name: [name]. Date of Service: 11/04/2017 1:00 PM Medical Record Number: [id number] Patient Account Number: [id number] Date of Birth/Sex: 1945-10-08 (71 y.o. Female) Treating RN: Montey Hora Primary Care Ishitha Roper: Viviana Simpler Other Clinician: Referring Daryle Boyington: Viviana Simpler Treating Hollis Tuller/Extender: Tito Dine in Treatment: 5 Wound Status Wound Number: 1 Primary Etiology: Diabetic Wound/Ulcer of the Lower Extremity Wound Location: Right Lower Leg - Medial Secondary Trauma, Other Wounding Event: Trauma Etiology: Date Acquired: 08/30/2017 Wound Status: Open Weeks Of Treatment: 5 Comorbid Hypertension, Type II Diabetes, Clustered Wound: No History: Osteoarthritis, Neuropathy Photos Photo Uploaded By: Alric Quan on 11/04/2017 14:02:16 Wound Measurements Length: (cm) 1.8 Width: (cm) 1.7 Depth: (cm) 0.5 Area: (cm) 2.403 Volume: (cm) 1.202 % Reduction in Area: 70.9% % Reduction in Volume: -45.7% Epithelialization: None Tunneling: No Undermining: Yes Starting Position (o'clock): 8 Ending Position (o'clock): 11 Maximum Distance: (cm) 0.4 Wound Description Classification: Grade 2 Wound Margin: Distinct, outline attached Exudate Amount: Large Exudate Type: Serosanguineous Exudate Color: red, brown Foul Odor After Cleansing: No Slough/Fibrino Yes Wound Bed Granulation Amount: Medium (34-66%) Exposed Structure Granulation Quality: Red Fascia Exposed: Yes Necrotic Amount: Medium (34-66%) Fat Layer (Subcutaneous Tissue) Exposed: Yes Necrotic Quality: Adherent Slough Muscle Exposed: Yes Necrosis of Muscle: No Valli, Breea M. ([id number]) Periwound Skin Texture Texture Color No Abnormalities Noted: No No Abnormalities Noted: No Scarring: Yes Temperature / Pain Moisture Temperature: No Abnormality No  Abnormalities Noted: No Tenderness on Palpation: Yes Wound Preparation Ulcer Cleansing: Rinsed/Irrigated with Saline, Other: soap and water, Topical Anesthetic Applied: Other: lidocaine 4%, Treatment Notes Wound #1 (Right, Medial Lower Leg) 1. Cleansed with: Clean wound with Normal Saline Cleanse wound with antibacterial soap and water 2.  Anesthetic Topical Lidocaine 4% cream to wound bed prior to debridement 4. Dressing Applied: Other dressing (specify in notes) 5. Secondary Dressing Applied ABD Pad Dry Gauze Kerlix/Conform Notes silvercel, unna to anchor, charcoal, xtrasorb, kerlix, Event organiser) Signed: 11/04/2017 4:38:39 PM By: Montey Hora Entered By: Montey Hora on 11/04/2017 13:34:42 Fahr, [name] ([id number]) -------------------------------------------------------------------------------- Wound Assessment Details Patient Name: [name]. Date of Service: 11/04/2017 1:00 PM Medical Record Number: [id number] Patient Account Number: [id number] Date of Birth/Sex: 28-Jul-1946 (71 y.o. Female) Treating RN: Montey Hora Primary Care Tieara Flitton: Viviana Simpler Other Clinician: Referring Reigan Tolliver: Viviana Simpler Treating Maja Mccaffery/Extender: Tito Dine in Treatment: 5 Wound Status Wound Number: 2 Primary Etiology: Diabetic Wound/Ulcer of the Lower Extremity Wound Location: Left Lower Leg - Lateral Secondary Trauma, Other Wounding Event: Trauma Etiology: Date Acquired: 08/30/2017 Wound Status: Open Weeks Of Treatment: 5 Comorbid Hypertension, Type II Diabetes, Clustered Wound: No History: Osteoarthritis, Neuropathy Photos Photo Uploaded By: Alric Quan on 11/04/2017 14:02:16 Wound Measurements Length: (cm) 6.4 Width: (cm) 1.9 Depth: (cm) 0.3 Area: (cm) 9.55 Volume: (cm) 2.865 % Reduction in Area: 51.7% % Reduction in Volume: 79.3% Epithelialization: None Tunneling: No Undermining: No Wound  Description Classification: Grade 2 Wound Margin: Distinct, outline attached Exudate Amount: Large Exudate Type: Serosanguineous Exudate Color: red, brown Foul Odor After Cleansing: No Slough/Fibrino Yes Wound Bed Granulation Amount: Small (1-33%) Exposed Structure Granulation Quality: Red Fascia Exposed: Yes Necrotic Amount: Large (67-100%) Fat Layer (Subcutaneous Tissue) Exposed: Yes Necrotic Quality: Adherent Slough Tendon Exposed: No Muscle Exposed: Yes Necrosis of Muscle: No Periwound Skin Texture Texture Color Lemen, Seri M. ([id number]) No Abnormalities Noted: No No Abnormalities Noted: No Scarring: Yes Temperature / Pain Moisture Temperature: No Abnormality No Abnormalities Noted: No Tenderness on Palpation: Yes Wound Preparation Ulcer Cleansing: Rinsed/Irrigated with Saline, Other: soap and water, Topical Anesthetic Applied: Other: lidocaine 4%, Treatment Notes Wound #2 (Left, Lateral Lower Leg) 1. Cleansed with: Clean wound with Normal Saline Cleanse wound with antibacterial soap and water 2. Anesthetic Topical Lidocaine 4% cream to wound bed prior to debridement 4. Dressing Applied: Other dressing (specify in notes) 5. Secondary Dressing Applied ABD Pad Dry Gauze Kerlix/Conform Notes silvercel, unna to anchor, charcoal, xtrasorb, kerlix, Event organiser) Signed: 11/04/2017 4:38:39 PM By: Montey Hora Entered By: Montey Hora on 11/04/2017 13:32:48 Negro, [name] ([id number]) -------------------------------------------------------------------------------- [place] Details Patient Name: [name]. Date of Service: 11/04/2017 1:00 PM Medical Record Number: [id number] Patient Account Number: [id number] Date of Birth/Sex: [birth year] (71 y.o. Female) Treating RN: Montey Hora Primary Care Starlina Lapre: Viviana Simpler Other Clinician: Referring Cameran Pettey: Viviana Simpler Treating Zeric Baranowski/Extender: Tito Dine in  Treatment: 5 Vital Signs Time Taken: 13:22 Temperature (F): 97.8 Height (in): 61 Pulse (bpm): 93 Weight (lbs): 179.1 Respiratory Rate (breaths/min): 18 Body Mass Index (BMI): 33.8 Blood Pressure (mmHg): 147/82 Reference Range: 80 - 120 mg / dl Electronic Signature(s) Signed: 11/04/2017 4:38:39 PM By: Montey Hora Entered By: Montey Hora on 11/04/2017 13:24:14

## 2017-11-05 NOTE — Progress Notes (Signed)
[name], [name] [place] ([id number]) Visit Report for 11/04/2017 HPI Details Patient Name: [name], [name]. Date of Service: 11/04/2017 1:00 PM Medical Record Number: [id number] Patient Account Number: [id number] Date of Birth/Sex: [birth year] (71 y.o. Female) Treating RN: Cornell Barman Primary Care Provider: Viviana Simpler Other Clinician: Referring Provider: Viviana Simpler Treating Provider/Extender: Tito Dine in Treatment: 5 History of Present Illness HPI Description: 09/30/17; patient is a 72 year old woman with type 2 diabetes and peripheral neuropathy. On 08/30/17 she fell down some steps. She developed redness and swelling and was seen in her primary physician's office on 09/11/17. She was felt to have cellulitis and given Keflex. She will return for follow-up on 09/18/17 noted to have an 8 x 4 cm contusion on the left lateral lower leg and a 3 x 3 cm contusion on the right medial lower leg. At follow-up on 09/29/16 she had a 4 x 4 centimeter necrotic surface and she's been referred here. The patient is not on blood thinners she does not have a history of falling. She states her diabetes is under good control ABIs in this clinic could not be obtained because of pain. Patient does not have a prior wound history 10/07/17; the patient came back to the clinic last week for a nurse rewrap. Noted that the area on the right medial leg had opened as well. She is using silver alginate under compression. The substantial wound on the left anterior leg is malodorous today. I did a culture of the wound surface but no empiric antibiotics. The area on the right lateral leg is open with a small draining open area with gelatinous old blood. Using pickups and scalpel I remove necrotic surface from around this area over the entire area, pulse fairly substantially inferiorly. We've been using silver alginate to both wound areas 10/14/17; patient still has substantial drainage from these areas although they  actually look some better. The area on the right lateral leg is opened up and there is depth of this. The area on the left leg is larger but more superficial. This area hasn't bridged area of skin that I thought I would have to removed however the granulation as come up and I'm wondering whether we can simply watch this for now these both were originally hematomas from trauma. CULTURE I did last week showed Escherichia coli and Pseudomonas both sensitive to ciprofloxacin. This was called in for her to replace the doxycycline I prescribed empirically 10/21/17; patient is making reasonably good progress in both wound areas include using silver alginate. Paradoxically the larger original wound area actually looks better. There is no evidence of infection. 10/28/17; patient continues to make excellent progress in both wound areas using silver alginate. I have been pleasantly surprised by the progress. I note ABIs of 1.25 on the right and 1.24 on the left. 11/04/17. Both the patient's wound area on the medial right calf and on the left anterior calf continue to make rapid progress. We have been using silver alginate Electronic Signature(s) Signed: 11/04/2017 5:07:14 PM By: Linton Ham MD Entered By: Linton Ham on 11/04/2017 14:15:49 [name], [name] ([id number]) -------------------------------------------------------------------------------- Physical Exam Details Patient Name: [name]. Date of Service: 11/04/2017 1:00 PM Medical Record Number: [id number] Patient Account Number: [id number] Date of Birth/Sex: [birth year] (71 y.o. Female) Treating RN: Cornell Barman Primary Care Provider: Viviana Simpler Other Clinician: Referring Provider: Viviana Simpler Treating Provider/Extender: Tito Dine in Treatment: 5 Constitutional Patient is hypertensive.. Pulse regular and within target range for patient.Marland Kitchen Respirations  regular, non-labored and within target range.. Temperature is  normal and within the target range for the patient.Marland Kitchen appears in no distress. Eyes Conjunctivae clear. No discharge. Respiratory Respiratory effort is easy and symmetric bilaterally. Rate is normal at rest and on room air.. Cardiovascular Pedal pulses palpable and strong bilaterally.. No major edema is seen. Integumentary (Hair, Skin) There is no systemic rash. Notes Wound exam; we continue have good progress in both wound areas the area on the left calf is now divided by an expanding area of normal looking skin. Both wound surfaces look satisfactory with healthy granulation oOn the right medial calf wound has more depth but the granulation looks healthy. No debrided and was necessary oNo evidence of infection in either area Electronic Signature(s) Signed: 11/04/2017 5:07:14 PM By: Linton Ham MD Entered By: Linton Ham on 11/04/2017 14:21:30 [name] ([id number]) -------------------------------------------------------------------------------- Physician Orders Details Patient Name: [name]. Date of Service: 11/04/2017 1:00 PM Medical Record Number: [id number] Patient Account Number: [id number] Date of Birth/Sex: 12-29-1945 (71 y.o. Female) Treating RN: Cornell Barman Primary Care Provider: Viviana Simpler Other Clinician: Referring Provider: Viviana Simpler Treating Provider/Extender: Tito Dine in Treatment: 5 Verbal / Phone Orders: No Diagnosis Coding Wound Cleansing Wound #1 Right,Medial Lower Leg o Clean wound with Normal Saline. o Cleanse wound with mild soap and water Wound #2 Left,Lateral Lower Leg o Clean wound with Normal Saline. o Cleanse wound with mild soap and water Anesthetic (add to Medication List) Wound #1 Right,Medial Lower Leg o Topical Lidocaine 4% cream applied to wound bed prior to debridement (In Clinic Only). o Hurricaine Topical Anesthetic Spray applied to wound bed prior to debridement (In Clinic Only). Wound  #2 Left,Lateral Lower Leg o Topical Lidocaine 4% cream applied to wound bed prior to debridement (In Clinic Only). o Hurricaine Topical Anesthetic Spray applied to wound bed prior to debridement (In Clinic Only). Primary Wound Dressing Wound #1 Right,Medial Lower Leg o Silvercel Non-Adherent Wound #2 Left,Lateral Lower Leg o Silvercel Non-Adherent Secondary Dressing Wound #1 Right,Medial Lower Leg o ABD pad o Other - charcoal Wound #2 Left,Lateral Lower Leg o ABD pad o Other - charcoal Dressing Change Frequency Wound #1 Right,Medial Lower Leg o Other: - twice a week Wound #2 Left,Lateral Lower Leg o Other: - twice a week Follow-up Appointments Wound #1 Right,Medial Lower Leg [name], BERTRAM. ([id number]) o Return Appointment in 1 week. o Nurse Visit as needed Wound #2 Left,Lateral Lower Leg o Return Appointment in 1 week. o Nurse Visit as needed Edema Control Wound #1 Right,Medial Lower Leg o Kerlix and Coban - Bilateral - unna to anchor o Elevate legs to the level of the heart and pump ankles as often as possible Wound #2 Left,Lateral Lower Leg o Kerlix and Coban - Bilateral - unna to anchor o Elevate legs to the level of the heart and pump ankles as often as possible Additional Orders / Instructions Wound #1 Right,Medial Lower Leg o Increase protein intake. o Other: - Vitamin C, Zinc Wound #2 Left,Lateral Lower Leg o Increase protein intake. o Other: - Vitamin C, Zinc Electronic Signature(s) Signed: 11/04/2017 4:57:19 PM By: Gretta Cool, BSN, RN, CWS, Kim RN, BSN Signed: 11/04/2017 5:07:14 PM By: Linton Ham MD Entered By: Gretta Cool, BSN, RN, CWS, Kim on 11/04/2017 13:57:47 [name], [name] ([id number]) -------------------------------------------------------------------------------- Problem List Details Patient Name: [name], HECKSTALL. Date of Service: 11/04/2017 1:00 PM Medical Record Number: [id number] Patient Account Number:  [id number] Date of Birth/Sex: 10/18/1945 (71 y.o. 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 09/30/2017 Yes L97.218 Non-pressure chronic ulcer of right calf with other specified severity 09/30/2017 Yes E11.40 Type 2 diabetes mellitus with diabetic neuropathy, unspecified 09/30/2017 Yes I87.331 Chronic venous hypertension (idiopathic) with ulcer and 10/07/2017 Yes inflammation of right lower extremity Inactive Problems Resolved Problems Electronic Signature(s) Signed: 11/04/2017 5:07:14 PM By: Linton Ham MD Entered By: Linton Ham on 11/04/2017 14:13:44 [name], [name] ([id number]) -------------------------------------------------------------------------------- Progress Note Details Patient Name: [name]. Date of Service: 11/04/2017 1:00 PM Medical Record Number: [id number] Patient Account Number: [id number] Date of Birth/Sex: 1945/11/10 (71 y.o. Female) Treating RN: Cornell Barman Primary Care Provider: Viviana Simpler Other Clinician: Referring Provider: Viviana Simpler Treating Provider/Extender: Tito Dine in Treatment: 5 Subjective History of Present Illness (HPI) 09/30/17; patient is a 72 year old woman with type 2 diabetes and peripheral neuropathy. On 08/30/17 she fell down some steps. She developed redness and swelling and was seen in her primary physician's office on 09/11/17. She was felt to have cellulitis and given Keflex. She will return for follow-up on 09/18/17 noted to have an 8 x 4 cm contusion on the left lateral lower leg and a 3 x 3 cm contusion on the right medial lower leg. At follow-up on 09/29/16 she had a 4 x 4 centimeter necrotic surface and she's  been referred here. The patient is not on blood thinners she does not have a history of falling. She states her diabetes is under good control ABIs in this clinic could not be obtained because of pain. Patient does not have a prior wound history 10/07/17; the patient came back to the clinic last week for a nurse rewrap. Noted that the area on the right medial leg had opened as well. She is using silver alginate under compression. The substantial wound on the left anterior leg is malodorous today. I did a culture of the wound surface but no empiric antibiotics. The area on the right lateral leg is open with a small draining open area with gelatinous old blood. Using pickups and scalpel I remove necrotic surface from around this area over the entire area, pulse fairly substantially inferiorly. We've been using silver alginate to both wound areas 10/14/17; patient still has substantial drainage from these areas although they actually look some better. The area on the right lateral leg is opened up and there is depth of this. The area on the left leg is larger but more superficial. This area hasn't bridged area of skin that I thought I would have to removed however the granulation as come up and I'm wondering whether we can simply watch this for now these both were originally hematomas from trauma. CULTURE I did last week showed Escherichia coli and Pseudomonas both sensitive to ciprofloxacin. This was called in for her to replace the doxycycline I prescribed empirically 10/21/17; patient is making reasonably good progress in both wound areas include using silver alginate. Paradoxically the larger original wound area actually looks better. There is no evidence of infection. 10/28/17; patient continues to make excellent progress in both wound areas using silver alginate. I have been pleasantly surprised by the progress. I note ABIs of 1.25 on the right and 1.24 on the left. 11/04/17. Both the patient's  wound area on the medial right calf and on  the left anterior calf continue to make rapid progress. We have been using silver alginate Objective Constitutional Patient is hypertensive.. Pulse regular and within target range for patient.Marland Kitchen Respirations regular, non-labored and within target range.. Temperature is normal and within the target range for the patient.Marland Kitchen appears in no distress. Vitals Time Taken: 1:22 PM, Height: 61 in, Weight: 179.1 lbs, BMI: 33.8, Temperature: 97.8 F, Pulse: 93 bpm, Respiratory Rate: 18 breaths/min, Blood Pressure: 147/82 mmHg. [name], [name] ([id number]) Eyes Conjunctivae clear. No discharge. Respiratory Respiratory effort is easy and symmetric bilaterally. Rate is normal at rest and on room air.. Cardiovascular Pedal pulses palpable and strong bilaterally.. No major edema is seen. General Notes: Wound exam; we continue have good progress in both wound areas the area on the left calf is now divided by an expanding area of normal looking skin. Both wound surfaces look satisfactory with healthy granulation On the right medial calf wound has more depth but the granulation looks healthy. No debrided and was necessary No evidence of infection in either area Integumentary (Hair, Skin) There is no systemic rash. Wound #1 status is Open. Original cause of wound was Trauma. The wound is located on the Right,Medial Lower Leg. The wound measures 1.8cm length x 1.7cm width x 0.5cm depth; 2.403cm^2 area and 1.202cm^3 volume. There is muscle, Fat Layer (Subcutaneous Tissue) Exposed, and fascia exposed. There is no tunneling noted, however, there is undermining starting at 8:00 and ending at 11:00 with a maximum distance of 0.4cm. There is a large amount of serosanguineous drainage noted. The wound margin is distinct with the outline attached to the wound base. There is medium (34-66%) red granulation within the wound bed. There is a medium (34-66%) amount of necrotic  tissue within the wound bed including Adherent Slough. The periwound skin appearance exhibited: Scarring. Periwound temperature was noted as No Abnormality. The periwound has tenderness on palpation. Wound #2 status is Open. Original cause of wound was Trauma. The wound is located on the Left,Lateral Lower Leg. The wound measures 6.4cm length x 1.9cm width x 0.3cm depth; 9.55cm^2 area and 2.865cm^3 volume. There is muscle, Fat Layer (Subcutaneous Tissue) Exposed, and fascia exposed. There is no tunneling or undermining noted. There is a large amount of serosanguineous drainage noted. The wound margin is distinct with the outline attached to the wound base. There is small (1-33%) red granulation within the wound bed. There is a large (67-100%) amount of necrotic tissue within the wound bed including Adherent Slough. The periwound skin appearance exhibited: Scarring. Periwound temperature was noted as No Abnormality. The periwound has tenderness on palpation. 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 ([id number]) Clean wound with Normal Saline. Cleanse wound with mild soap and water Wound #2 Left,Lateral Lower Leg: Clean wound with Normal Saline. Cleanse wound with mild soap and water Anesthetic (add to Medication List): Wound #1 Right,Medial Lower Leg: Topical Lidocaine 4% cream applied to wound bed prior to debridement (In Clinic Only). Hurricaine Topical Anesthetic Spray applied to wound bed prior to debridement (In Clinic Only). Wound #2 Left,Lateral Lower Leg: Topical Lidocaine 4% cream applied  to wound bed prior to debridement (In Clinic Only). Hurricaine Topical Anesthetic Spray  applied to wound bed prior to debridement (In Clinic Only). Primary Wound Dressing: Wound #1 Right,Medial Lower Leg: Silvercel Non-Adherent Wound #2 Left,Lateral Lower Leg: Silvercel Non-Adherent Secondary Dressing: Wound #1 Right,Medial Lower Leg: ABD pad Other - charcoal Wound #2 Left,Lateral Lower Leg: ABD pad Other - charcoal Dressing Change Frequency: Wound #1 Right,Medial Lower Leg: Other: - twice a week Wound #2 Left,Lateral Lower Leg: Other: - twice a week Follow-up Appointments: Wound #1 Right,Medial Lower Leg: Return Appointment in 1 week. Nurse Visit as needed Wound #2 Left,Lateral Lower Leg: Return Appointment in 1 week. Nurse Visit as needed Edema Control: Wound #1 Right,Medial Lower Leg: Kerlix and Coban - Bilateral - unna to anchor Elevate legs to the level of the heart and pump ankles as often as possible Wound #2 Left,Lateral Lower Leg: Kerlix and Coban - Bilateral - unna to anchor Elevate legs to the level of the heart and pump ankles as often as possible Additional Orders / Instructions: Wound #1 Right,Medial Lower Leg: Increase protein intake. Other: - Vitamin C, Zinc Wound #2 Left,Lateral Lower Leg: Increase protein intake. Other: - Vitamin C, Zinc [name], [name] ([id number]) o #1 continue to make good progress in both wound areas. #2 continue silver alginate/ABDs/Kerlix Coban compression #3 there is no evidence of infection in either area Electronic Signature(s) Signed: 11/04/2017 5:07:14 PM By: Linton Ham MD Entered By: Linton Ham on 11/04/2017 14:23:16 [name], [name] ([id number]) -------------------------------------------------------------------------------- [name] Details Patient Name: [name]. Date of Service: 11/04/2017 Medical Record Number: [id number] Patient Account Number: [id number] Date of Birth/Sex: [birth year] (72 y.o.  Female) Treating RN: Cornell Barman Primary Care Provider: Viviana Simpler Other Clinician: Referring Provider: Viviana Simpler Treating Provider/Extender: Tito Dine in Treatment: 5 Diagnosis Coding ICD-10 Codes Code Description [phone] 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e L97.218 Non-pressure chronic ulcer of right calf with other specified severity E11.40 Type 2 diabetes mellitus with diabetic neuropathy, unspecified I87.331 Chronic venous hypertension (idiopathic) with ulcer and inflammation of right lower extremity Facility Procedures CPT4 Code: [id number] Description: 99213 - WOUND CARE VISIT-LEV 3 EST PT Modifier: Quantity: 1 Physician Procedures CPT4 Code: [id number] Description: 09643 - WC PHYS LEVEL 3 - EST PT ICD-10 Diagnosis Description 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 L97.218 Non-pressure chronic ulcer of right calf with other specifie Modifier: cle d severity Quantity: 1 Electronic Signature(s) Signed: 11/04/2017 5:07:14 PM By: Linton Ham MD Entered By: Linton Ham on 11/04/2017 14:23:38

## 2017-11-06 ENCOUNTER — Encounter: Payer: Self-pay | Admitting: Internal Medicine

## 2017-11-09 ENCOUNTER — Other Ambulatory Visit: Payer: Self-pay | Admitting: Internal Medicine

## 2017-11-10 ENCOUNTER — Encounter: Payer: Self-pay | Admitting: Internal Medicine

## 2017-11-10 ENCOUNTER — Other Ambulatory Visit: Payer: Self-pay | Admitting: Internal Medicine

## 2017-11-11 ENCOUNTER — Encounter: Payer: Medicare Other | Attending: Internal Medicine | Admitting: Internal Medicine

## 2017-11-11 DIAGNOSIS — S81801A Unspecified open wound, right lower leg, initial encounter: Secondary | ICD-10-CM | POA: Diagnosis not present

## 2017-11-11 DIAGNOSIS — L97223 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e: Secondary | ICD-10-CM | POA: Diagnosis not present

## 2017-11-11 DIAGNOSIS — I10 Essential (primary) hypertension: Secondary | ICD-10-CM | POA: Insufficient documentation

## 2017-11-11 DIAGNOSIS — L97218 Non-pressure chronic ulcer of right calf with other specified severity: Secondary | ICD-10-CM | POA: Diagnosis not present

## 2017-11-11 DIAGNOSIS — E1140 Type 2 diabetes mellitus with diabetic neuropathy, unspecified: Secondary | ICD-10-CM | POA: Diagnosis not present

## 2017-11-11 DIAGNOSIS — I87331 Chronic venous hypertension (idiopathic) with ulcer and inflammation of right lower extremity: Secondary | ICD-10-CM | POA: Diagnosis not present

## 2017-11-11 DIAGNOSIS — S81802A Unspecified open wound, left lower leg, initial encounter: Secondary | ICD-10-CM | POA: Diagnosis not present

## 2017-11-11 DIAGNOSIS — E11621 Type 2 diabetes mellitus with foot ulcer: Secondary | ICD-10-CM | POA: Diagnosis not present

## 2017-11-12 NOTE — Progress Notes (Signed)
MARKEISHA, MANCIAS [place] ([id number]) Visit Report for 11/11/2017 Arrival Information Details Patient Name: LONDON, NONAKA. Date of Service: 11/11/2017 2:00 PM Medical Record Number: [id number] Patient Account Number: [id number] Date of Birth/Sex: May 22, 1946 (71 y.o. Female) Treating RN: Carolyne Fiscal, Debi Primary Care Zenya Hickam: Viviana Simpler Other Clinician: Referring Yi Falletta: Viviana Simpler Treating Koree Staheli/Extender: Tito Dine in Treatment: 6 Visit Information History Since Last Visit All ordered tests and consults were completed: No Patient Arrived: Kasandra Knudsen Added or deleted any medications: No Arrival Time: 14:09 Any new allergies or adverse reactions: No Accompanied By: self Had a fall or experienced change in No Transfer Assistance: None activities of daily living that may affect Patient Identification Verified: Yes risk of falls: Secondary Verification Process Completed: Yes Signs or symptoms of abuse/neglect since last visito No Patient Requires Transmission-Based Precautions: No Hospitalized since last visit: No Patient Has Alerts: Yes Has Dressing in Place as Prescribed: Yes Patient Alerts: DM II Has Compression in Place as Prescribed: Yes Pain Present Now: No Electronic Signature(s) Signed: 11/11/2017 4:15:20 PM By: Alric Quan Entered By: Alric Quan on 11/11/2017 14:11:41 Cazeau, [name] ([id number]) -------------------------------------------------------------------------------- Clinic Level of Care Assessment Details Patient Name: [name]. Date of Service: 11/11/2017 2:00 PM Medical Record Number: [id number] Patient Account Number: [id number] Date of Birth/Sex: 10/06/1945 (71 y.o. Female) Treating RN: Cornell Barman Primary Care Swade Shonka: Viviana Simpler Other Clinician: Referring Zacharie Portner: Viviana Simpler Treating Makyle Eslick/Extender: Tito Dine in Treatment: 6 Clinic Level of Care Assessment Items TOOL 1 Quantity Score []  - Use  when EandM and Procedure is performed on INITIAL visit 0 ASSESSMENTS - Nursing Assessment / Reassessment []  - General Physical Exam (combine w/ comprehensive assessment (listed just below) when 0 performed on new pt. evals) []  - 0 Comprehensive Assessment (HX, ROS, Risk Assessments, Wounds Hx, etc.) ASSESSMENTS - Wound and Skin Assessment / Reassessment []  - Dermatologic / Skin Assessment (not related to wound area) 0 ASSESSMENTS - Ostomy and/or Continence Assessment and Care []  - Incontinence Assessment and Management 0 []  - 0 Ostomy Care Assessment and Management (repouching, etc.) PROCESS - Coordination of Care []  - Simple Patient / Family Education for ongoing care 0 []  - 0 Complex (extensive) Patient / Family Education for ongoing care []  - 0 Staff obtains Programmer, systems, Records, Test Results / Process Orders []  - 0 Staff telephones HHA, Nursing Homes / Clarify orders / etc []  - 0 Routine Transfer to another Facility (non-emergent condition) []  - 0 Routine Hospital Admission (non-emergent condition) []  - 0 New Admissions / Biomedical engineer / Ordering NPWT, Apligraf, etc. []  - 0 Emergency Hospital Admission (emergent condition) PROCESS - Special Needs []  - Pediatric / Minor Patient Management 0 []  - 0 Isolation Patient Management []  - 0 Hearing / Language / Visual special needs []  - 0 Assessment of Community assistance (transportation, D/C planning, etc.) []  - 0 Additional assistance / Altered mentation []  - 0 Support Surface(s) Assessment (bed, cushion, seat, etc.) Maxham, Vale M. ([id number]) INTERVENTIONS - Miscellaneous []  - External ear exam 0 []  - 0 Patient Transfer (multiple staff / Civil Service fast streamer / Similar devices) []  - 0 Simple Staple / Suture removal (25 or less) []  - 0 Complex Staple / Suture removal (26 or more) []  - 0 Hypo/Hyperglycemic Management (do not check if billed separately) []  - 0 Ankle / Brachial Index (ABI) - do not check if billed  separately Has the patient been seen at the hospital within the last three years: Yes Total Score: 0 Level Of Care: ____  Electronic Signature(s) Signed: 11/11/2017 4:24:03 PM By: Gretta Cool, BSN, RN, CWS, Kim RN, BSN Entered By: Gretta Cool, BSN, RN, CWS, Kim on 11/11/2017 14:35:47 Lynne, [name] ([id number]) -------------------------------------------------------------------------------- Encounter Discharge Information Details Patient Name: [name]. Date of Service: 11/11/2017 2:00 PM Medical Record Number: [id number] Patient Account Number: [id number] Date of Birth/Sex: 04-Feb-1946 (71 y.o. Female) Treating RN: Cornell Barman Primary Care Powell Halbert: Viviana Simpler Other Clinician: Referring Kaito Schulenburg: Viviana Simpler Treating Asjia Berrios/Extender: Tito Dine in Treatment: 6 Encounter Discharge Information Items Discharge Pain Level: 0 Discharge Condition: Stable Ambulatory Status: Ambulatory Discharge Destination: Home Transportation: Private Auto Accompanied By: self Schedule Follow-up Appointment: Yes Medication Reconciliation completed and No provided to Patient/Care Kameo Bains: Provided on Clinical Summary of Care: 11/11/2017 Form Type Recipient Paper Patient LK Electronic Signature(s) Signed: 11/11/2017 4:38:42 PM By: Montey Hora Entered By: Montey Hora on 11/11/2017 15:01:07 [name] ([id number]) -------------------------------------------------------------------------------- Lower Extremity Assessment Details Patient Name: [name]. Date of Service: 11/11/2017 2:00 PM Medical Record Number: [id number] Patient Account Number: [id number] Date of Birth/Sex: 1946/03/27 (71 y.o. Female) Treating RN: Carolyne Fiscal, Debi Primary Care Alexsandria Kivett: Viviana Simpler Other Clinician: Referring Rhesa Forsberg: Viviana Simpler Treating Isahia Hollerbach/Extender: Tito Dine in Treatment: 6 Edema Assessment Assessed: [Left: No] [Right: No] [Left: Edema] [Right:  :] Calf Left: Right: Point of Measurement: 34 cm From Medial Instep 35.3 cm 36 cm Ankle Left: Right: Point of Measurement: 9 cm From Medial Instep 24 cm 23.3 cm Vascular Assessment Claudication: Claudication Assessment [Left:None] [Right:None] Pulses: Dorsalis Pedis Palpable: [Left:Yes] [Right:Yes] Posterior Tibial Extremity colors, hair growth, and conditions: Extremity Color: [Left:Hyperpigmented] [Right:Hyperpigmented] Temperature of Extremity: [Left:Warm] [Right:Warm] Capillary Refill: [Left:< 3 seconds] [Right:< 3 seconds] Toe Nail Assessment Left: Right: Thick: Yes Yes Discolored: No No Deformed: No No Improper Length and Hygiene: Yes Yes Electronic Signature(s) Signed: 11/11/2017 4:15:20 PM By: Alric Quan Entered By: Alric Quan on 11/11/2017 14:24:03 Paugh, [name] ([id number]) -------------------------------------------------------------------------------- Multi Wound Chart Details Patient Name: [name]. Date of Service: 11/11/2017 2:00 PM Medical Record Number: [id number] Patient Account Number: [id number] Date of Birth/Sex: 02-10-1946 (71 y.o. Female) Treating RN: Cornell Barman Primary Care Anhar Mcdermott: Viviana Simpler Other Clinician: Referring Rylan Bernard: Viviana Simpler Treating Findley Vi/Extender: Tito Dine in Treatment: 6 Vital Signs Height(in): 61 Pulse(bpm): 99 Weight(lbs): 179.1 Blood Pressure(mmHg): 136/65 Body Mass Index(BMI): 34 Temperature(F): 97.9 Respiratory Rate 18 (breaths/min): Photos: [1:No Photos] [2:No Photos] [N/A:N/A] Wound Location: [1:Right, Medial Lower Leg] [2:Left, Lateral Lower Leg] [N/A:N/A] Wounding Event: [1:Trauma] [2:Trauma] [N/A:N/A] Primary Etiology: [1:Diabetic Wound/Ulcer of the Lower Extremity] [2:Diabetic Wound/Ulcer of the Lower Extremity] [N/A:N/A] Secondary Etiology: [1:Trauma, Other] [2:Trauma, Other] [N/A:N/A] Date Acquired: [1:08/30/2017] [2:08/30/2017] [N/A:N/A] Weeks of Treatment:  [1:6] [2:6] [N/A:N/A] Wound Status: [1:Open] [2:Open] [N/A:N/A] Measurements L x W x D [1:1.5x1.5x0.3] [2:5.4x1.2x0.3] [N/A:N/A] (cm) Area (cm) : [1:1.767] [2:5.089] [N/A:N/A] Volume (cm) : [1:0.53] [2:1.527] [N/A:N/A] % Reduction in Area: [1:78.60%] [2:74.30%] [N/A:N/A] % Reduction in Volume: [1:35.80%] [2:89.00%] [N/A:N/A] Classification: [1:Grade 2] [2:Grade 2] [N/A:N/A] Periwound Skin Texture: [1:No Abnormalities Noted] [2:No Abnormalities Noted] [N/A:N/A] Periwound Skin Moisture: [1:No Abnormalities Noted] [2:No Abnormalities Noted] [N/A:N/A] Periwound Skin Color: [1:No Abnormalities Noted No] [2:No Abnormalities Noted No] [N/A:N/A N/A] Treatment Notes Electronic Signature(s) Signed: 11/11/2017 5:10:26 PM By: Linton Ham MD Entered By: Linton Ham on 11/11/2017 14:37:07 Hisle, [name] ([id number]) -------------------------------------------------------------------------------- [place] Details Patient Name: [name]. Date of Service: 11/11/2017 2:00 PM Medical Record Number: [id number] Patient Account Number: [id number] Date of Birth/Sex: Aug 17, 1946 (71 y.o. Female) Treating RN: Cornell Barman Primary Care Zayra Devito:  Viviana Simpler Other Clinician: Referring Salome Hautala: Viviana Simpler Treating Zuriyah Shatz/Extender: Tito Dine in Treatment: 6 Active Inactive ` Abuse / Safety / Falls / Self Care Management Nursing Diagnoses: History of Falls Potential for falls Goals: Patient will not experience any injury related to falls Date Initiated: 09/30/2017 Target Resolution Date: 01/16/2018 Goal Status: Active Interventions: Assess Activities of Daily Living upon admission and as needed Assess fall risk on admission and as needed Assess: immobility, friction, shearing, incontinence upon admission and as needed Assess impairment of mobility on admission and as needed per policy Notes: ` Nutrition Nursing Diagnoses: Imbalanced  nutrition Impaired glucose control: actual or potential Potential for alteratiion in Nutrition/Potential for imbalanced nutrition Goals: Patient/caregiver agrees to and verbalizes understanding of need to use nutritional supplements and/or vitamins as prescribed Date Initiated: 09/30/2017 Target Resolution Date: 01/16/2018 Goal Status: Active Patient/caregiver will maintain therapeutic glucose control Date Initiated: 09/30/2017 Target Resolution Date: 01/16/2018 Goal Status: Active Interventions: Assess patient nutrition upon admission and as needed per policy Provide education on elevated blood sugars and impact on wound healing Notes: WAYNETTE, TOWERS ([id number]) Orientation to the Wound Care Program Nursing Diagnoses: Knowledge deficit related to the wound healing center program Goals: Patient/caregiver will verbalize understanding of the [facility] Date Initiated: 09/30/2017 Target Resolution Date: 10/17/2017 Goal Status: Active Interventions: Provide education on orientation to the wound center Notes: ` Pain, Acute or Chronic Nursing Diagnoses: Pain, acute or chronic: actual or potential Potential alteration in comfort, pain Goals: Patient/caregiver will verbalize adequate pain control between visits Date Initiated: 09/30/2017 Target Resolution Date: 01/16/2018 Goal Status: Active Interventions: Complete pain assessment as per visit requirements Notes: ` Wound/Skin Impairment Nursing Diagnoses: Impaired tissue integrity Knowledge deficit related to ulceration/compromised skin integrity Goals: Ulcer/skin breakdown will have a volume reduction of 80% by week 12 Date Initiated: 09/30/2017 Target Resolution Date: 01/16/2018 Goal Status: Active Interventions: Assess patient/caregiver ability to perform ulcer/skin care regimen upon admission and as needed Assess ulceration(s) every visit Notes: Electronic Signature(s) Signed: 11/11/2017 4:24:03 PM By:  Gretta Cool, BSN, RN, CWS, Kim RN, BSN Entered By: Gretta Cool, BSN, RN, CWS, Kim on 11/11/2017 14:32:17 Barfuss, [name] ([id number]) Romana Juniper, [name] ([id number]) -------------------------------------------------------------------------------- Pain Assessment Details Patient Name: [name]. Date of Service: 11/11/2017 2:00 PM Medical Record Number: [id number] Patient Account Number: [id number] Date of Birth/Sex: [birth year] (71 y.o. Female) Treating RN: Carolyne Fiscal, Debi Primary Care Marlen Koman: Viviana Simpler Other Clinician: Referring Yale Golla: Viviana Simpler Treating Tyrann Donaho/Extender: Tito Dine in Treatment: 6 Active Problems Location of Pain Severity and Description of Pain Patient Has Paino No Site Locations Pain Management and Medication Current Pain Management: Electronic Signature(s) Signed: 11/11/2017 4:15:20 PM By: Alric Quan Entered By: Alric Quan on 11/11/2017 14:12:58 Sanz, [name] ([id number]) -------------------------------------------------------------------------------- Patient/Caregiver Education Details Patient Name: [name]. Date of Service: 11/11/2017 2:00 PM Medical Record Number: [id number] Patient Account Number: [id number] Date of Birth/Gender: 04/25/1946 (71 y.o. Female) Treating RN: Montey Hora Primary Care Physician: Viviana Simpler Other Clinician: Referring Physician: Viviana Simpler Treating Physician/Extender: Tito Dine in Treatment: 6 Education Assessment Education Provided To: Patient Education Topics Provided Venous: Handouts: Other: leg elevation Methods: Explain/Verbal Responses: State content correctly Electronic Signature(s) Signed: 11/11/2017 4:38:42 PM By: Montey Hora Entered By: Montey Hora on 11/11/2017 15:01:24 Gerardo, [name] ([id number]) -------------------------------------------------------------------------------- Wound Assessment Details Patient Name: [name]. Date of Service: 11/11/2017 2:00 PM Medical Record Number: [id number] Patient Account Number: [id number] Date of Birth/Sex: 1945/11/24 (71 y.o. Female) Treating RN: Carolyne Fiscal,  Debi Primary Care Meredith Kilbride: Viviana Simpler Other Clinician: Referring Alishea Beaudin: Viviana Simpler Treating Suhan Paci/Extender: Tito Dine in Treatment: 6 Wound Status Wound Number: 1 Primary Etiology: Diabetic Wound/Ulcer of the Lower Extremity Wound Location: Right, Medial Lower Leg Secondary Trauma, Other Wounding Event: Trauma Etiology: Date Acquired: 08/30/2017 Wound Status: Open Weeks Of Treatment: 6 Clustered Wound: No Photos Photo Uploaded By: Gretta Cool, BSN, RN, CWS, Kim on 11/11/2017 16:45:01 Wound Measurements Length: (cm) 1.5 Width: (cm) 1.5 Depth: (cm) 0.3 Area: (cm) 1.767 Volume: (cm) 0.53 % Reduction in Area: 78.6% % Reduction in Volume: 35.8% Wound Description Classification: Grade 2 Periwound Skin Texture Texture Color No Abnormalities Noted: No No Abnormalities Noted: No Moisture No Abnormalities Noted: No Treatment Notes Wound #1 (Right, Medial Lower Leg) 1. Cleansed with: Clean wound with Normal Saline Cleanse wound with antibacterial soap and water 2. Anesthetic Topical Lidocaine 4% cream to wound bed prior to debridement JAYMEE, TILSON. ([id number]) 4. Dressing Applied: Other dressing (specify in notes) 5. Secondary Dressing Applied ABD Pad Dry Gauze Kerlix/Conform Notes silvercel, unna to anchor, charcoal, xtrasorb, kerlix, coban Electronic Signature(s) Signed: 11/11/2017 4:15:20 PM By: Alric Quan Entered By: Alric Quan on 11/11/2017 14:20:48 Diprima, [name] ([id number]) -------------------------------------------------------------------------------- Wound Assessment Details Patient Name: [name]. Date of Service: 11/11/2017 2:00 PM Medical Record Number: [id number] Patient Account Number: [id number] Date of Birth/Sex: Aug 12, 1946  (71 y.o. Female) Treating RN: Carolyne Fiscal, Debi Primary Care Takyra Cantrall: Viviana Simpler Other Clinician: Referring Reaghan Kawa: Viviana Simpler Treating Mana Haberl/Extender: Tito Dine in Treatment: 6 Wound Status Wound Number: 2 Primary Etiology: Diabetic Wound/Ulcer of the Lower Extremity Wound Location: Left, Lateral Lower Leg Secondary Trauma, Other Wounding Event: Trauma Etiology: Date Acquired: 08/30/2017 Wound Status: Open Weeks Of Treatment: 6 Clustered Wound: No Photos Photo Uploaded By: Gretta Cool, BSN, RN, CWS, Kim on 11/11/2017 16:46:00 Wound Measurements Length: (cm) 5.4 Width: (cm) 1.2 Depth: (cm) 0.3 Area: (cm) 5.089 Volume: (cm) 1.527 % Reduction in Area: 74.3% % Reduction in Volume: 89% Wound Description Classification: Grade 2 Periwound Skin Texture Texture Color No Abnormalities Noted: No No Abnormalities Noted: No Moisture No Abnormalities Noted: No Treatment Notes Wound #2 (Left, Lateral Lower Leg) 1. Cleansed with: Clean wound with Normal Saline Cleanse wound with antibacterial soap and water 2. Anesthetic Topical Lidocaine 4% cream to wound bed prior to debridement MILLIANI, HERRADA. ([id number]) 4. Dressing Applied: Other dressing (specify in notes) 5. Secondary Dressing Applied ABD Pad Dry Gauze Kerlix/Conform Notes silvercel, unna to anchor, charcoal, xtrasorb, kerlix, coban Electronic Signature(s) Signed: 11/11/2017 4:15:20 PM By: Alric Quan Entered By: Alric Quan on 11/11/2017 14:19:44 Gillham, [name] ([id number]) -------------------------------------------------------------------------------- [place] Details Patient Name: [name]. Date of Service: 11/11/2017 2:00 PM Medical Record Number: [id number] Patient Account Number: [id number] Date of Birth/Sex: 17-Jan-1946 (71 y.o. Female) Treating RN: Carolyne Fiscal, Debi Primary Care Marchelle Rinella: Viviana Simpler Other Clinician: Referring Kazuki Ingle: Viviana Simpler Treating  Doyt Castellana/Extender: Tito Dine in Treatment: 6 Vital Signs Time Taken: 14:13 Temperature (F): 97.9 Height (in): 61 Pulse (bpm): 99 Weight (lbs): 179.1 Respiratory Rate (breaths/min): 18 Body Mass Index (BMI): 33.8 Blood Pressure (mmHg): 136/65 Reference Range: 80 - 120 mg / dl Electronic Signature(s) Signed: 11/11/2017 4:15:20 PM By: Alric Quan Entered By: Alric Quan on 11/11/2017 14:13:20

## 2017-11-12 NOTE — Progress Notes (Signed)
Alexis, YOGI Velez ([id number]) Visit Report for 11/11/2017 HPI Details Patient Name: [name], [name]. Date of Service: 11/11/2017 2:00 PM Medical Record Number: [id number] Patient Account Number: [id number] Date of Birth/Sex: 22-Sep-1945 (72 y.o. Female) Treating RN: Cornell Barman Primary Care Provider: Viviana Simpler Other Clinician: Referring Provider: Viviana Simpler Treating Provider/Extender: Tito Dine in Treatment: 6 History of Present Illness HPI Description: 09/30/17; patient is a 72 year old woman with type 2 diabetes and peripheral neuropathy. On 08/30/17 she fell down some steps. She developed redness and swelling and was seen in her primary physician's office on 09/11/17. She was felt to have cellulitis and given Keflex. She will return for follow-up on 09/18/17 noted to have an 8 x 4 cm contusion on the left lateral lower leg and a 3 x 3 cm contusion on the right medial lower leg. At follow-up on 09/29/16 she had a 4 x 4 centimeter necrotic surface and she's been referred here. The patient is not on blood thinners she does not have a history of falling. She states her diabetes is under good control ABIs in this clinic could not be obtained because of pain. Patient does not have a prior wound history 10/07/17; the patient came back to the clinic last week for a nurse rewrap. Noted that the area on the right medial leg had opened as well. She is using silver alginate under compression. The substantial wound on the left anterior leg is malodorous today. I did a culture of the wound surface but no empiric antibiotics. The area on the right lateral leg is open with a small draining open area with gelatinous old blood. Using pickups and scalpel I remove necrotic surface from around this area over the entire area, pulse fairly substantially inferiorly. We've been using silver alginate to both wound areas 10/14/17; patient still has substantial drainage from these areas although they  actually look some better. The area on the right lateral leg is opened up and there is depth of this. The area on the left leg is larger but more superficial. This area hasn't bridged area of skin that I thought I would have to removed however the granulation as come up and I'm wondering whether we can simply watch this for now these both were originally hematomas from trauma. CULTURE I did last week showed Escherichia coli and Pseudomonas both sensitive to ciprofloxacin. This was called in for her to replace the doxycycline I prescribed empirically 10/21/17; patient is making reasonably good progress in both wound areas include using silver alginate. Paradoxically the larger original wound area actually looks better. There is no evidence of infection. 10/28/17; patient continues to make excellent progress in both wound areas using silver alginate. I have been pleasantly surprised by the progress. I note ABIs of 1.25 on the right and 1.24 on the left. 11/04/17. Both the patient's wound area on the medial right calf and on the left anterior calf continue to make rapid progress. We have been using silver alginate 11/11/17; both of the patient's wounds continue to make nice progress using silver alginate. Electronic Signature(s) Signed: 11/11/2017 5:10:26 PM By: Linton Ham MD Entered By: Linton Ham on 11/11/2017 14:37:54 [name], [name] ([id number]) -------------------------------------------------------------------------------- Physical Exam Details Patient Name: [name]. Date of Service: 11/11/2017 2:00 PM Medical Record Number: [id number] Patient Account Number: [id number] Date of Birth/Sex: [birth year] (72 y.o. Female) Treating RN: Cornell Barman Primary Care Provider: Viviana Simpler Other Clinician: Referring Provider: Viviana Simpler Treating Provider/Extender: Tito Dine in Treatment:  6 Constitutional Sitting or standing Blood Pressure is within target range for  patient.. Pulse regular and within target range for patient.Marland Kitchen Respirations regular, non-labored and within target range.. Temperature is normal and within the target range for the patient.Marland Kitchen appears in no distress. Eyes Conjunctivae clear. No discharge. Respiratory Respiratory effort is easy and symmetric bilaterally. Rate is normal at rest and on room air.. Cardiovascular Pedal pulses palpable and strong bilaterally.. Lymphatic . Integumentary (Hair, Skin) no rash. Psychiatric No evidence of depression, anxiety, or agitation. Calm, cooperative, and communicative. Appropriate interactions and affect.. Notes wound exam; we continue have nice progress in both wound areas. The area on the left calf divided by increasingly healthy epithelialization and healing. No debridement is required on either side On the right the medial calf the depth of this seems to come and nicely again healthy-looking tissue at the base of this wound Electronic Signature(s) Signed: 11/11/2017 5:10:26 PM By: Linton Ham MD Entered By: Linton Ham on 11/11/2017 14:39:28 [name] ([id number]) -------------------------------------------------------------------------------- Physician Orders Details Patient Name: [name]. Date of Service: 11/11/2017 2:00 PM Medical Record Number: [id number] Patient Account Number: [id number] Date of Birth/Sex: Jan 29, 1946 (72 y.o. Female) Treating RN: Cornell Barman Primary Care Provider: Viviana Simpler Other Clinician: Referring Provider: Viviana Simpler Treating Provider/Extender: Tito Dine in Treatment: 6 Verbal / Phone Orders: No Diagnosis Coding Wound Cleansing Wound #1 Right,Medial Lower Leg o Clean wound with Normal Saline. o Cleanse wound with mild soap and water Wound #2 Left,Lateral Lower Leg o Clean wound with Normal Saline. o Cleanse wound with mild soap and water Anesthetic (add to Medication List) Wound #1 Right,Medial  Lower Leg o Topical Lidocaine 4% cream applied to wound bed prior to debridement (In Clinic Only). o Hurricaine Topical Anesthetic Spray applied to wound bed prior to debridement (In Clinic Only). Wound #2 Left,Lateral Lower Leg o Topical Lidocaine 4% cream applied to wound bed prior to debridement (In Clinic Only). o Hurricaine Topical Anesthetic Spray applied to wound bed prior to debridement (In Clinic Only). Primary Wound Dressing Wound #1 Right,Medial Lower Leg o Silvercel Non-Adherent Wound #2 Left,Lateral Lower Leg o Silvercel Non-Adherent Secondary Dressing Wound #1 Right,Medial Lower Leg o ABD pad o Other - charcoal Wound #2 Left,Lateral Lower Leg o ABD pad o Other - charcoal Dressing Change Frequency Wound #1 Right,Medial Lower Leg o Other: - twice a week Wound #2 Left,Lateral Lower Leg o Other: - twice a week Follow-up Appointments Wound #1 Right,Medial Lower Leg [name], [name]. ([id number]) o Return Appointment in 1 week. o Nurse Visit as needed Wound #2 Left,Lateral Lower Leg o Return Appointment in 1 week. o Nurse Visit as needed Edema Control Wound #1 Right,Medial Lower Leg o Kerlix and Coban - Bilateral - unna to anchor o Elevate legs to the level of the heart and pump ankles as often as possible Wound #2 Left,Lateral Lower Leg o Kerlix and Coban - Bilateral - unna to anchor o Elevate legs to the level of the heart and pump ankles as often as possible Additional Orders / Instructions Wound #1 Right,Medial Lower Leg o Increase protein intake. o Other: - Vitamin C, Zinc Wound #2 Left,Lateral Lower Leg o Increase protein intake. o Other: - Vitamin C, Zinc Patient Medications Allergies: NKDA Notifications Medication Indication Start End lidocaine DOSE topical 4 % cream - cream topical Electronic Signature(s) Signed: 11/11/2017 4:24:03 PM By: Gretta Cool, BSN, RN, CWS, Kim RN, BSN Signed: 11/11/2017 5:10:26 PM By:  Linton Ham MD Entered By: Gretta Cool,  BSN, RN, CWS, Kim on 11/11/2017 14:34:13 [name], [name] ([id number]) -------------------------------------------------------------------------------- Prescription 11/11/2017 Patient Name: [name]. Provider: Ricard Dillon MD Date of Birth: 1946/05/01 NPI#: [id number] Sex: F DEA#: [id number] Phone #: [phone] License #: [id number] Patient Address: [address], [place], [place] [address] [place],  [address] [phone] Allergies NKDA Medication Medication: Route: Strength: Form: lidocaine topical 4% cream Class: TOPICAL LOCAL ANESTHETICS Dose: Frequency / Time: Indication: cream topical Number of Refills: Number of Units: 0 Generic Substitution: Start Date: End Date: Administered at [place]: Yes Time Administered: Time Discontinued: Note to Pharmacy: Signature(s): Date(s): Electronic Signature(s) Signed: 11/11/2017 4:24:03 PM By: Gretta Cool, BSN, RN, CWS, Kim RN, BSN Signed: 11/11/2017 5:10:26 PM By: Linton Ham MD Entered By: Gretta Cool, BSN, RN, CWS, Kim on 11/11/2017 14:34:14 Mantey, [name] ([id number]) [name], [name] ([id number]) --------------------------------------------------------------------------------  Problem List Details Patient Name: [name], [name]. Date of Service: 11/11/2017 2:00 PM Medical Record Number: [id number] Patient Account Number: [id number] Date of Birth/Sex: 03-17-1946 (71 y.o. 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 09/30/2017 Yes L97.218 Non-pressure  chronic ulcer of right calf with other specified severity 09/30/2017 Yes E11.40 Type 2 diabetes mellitus with diabetic neuropathy, unspecified 09/30/2017 Yes I87.331 Chronic venous hypertension (idiopathic) with ulcer and 10/07/2017 Yes inflammation of right lower extremity Inactive Problems Resolved Problems Electronic Signature(s) Signed: 11/11/2017 5:10:26 PM By: Linton Ham MD Entered By: Linton Ham on 11/11/2017 14:36:54 Ciccone, [name] ([id number]) -------------------------------------------------------------------------------- Progress Note Details Patient Name: [name]. Date of Service: 11/11/2017 2:00 PM Medical Record Number: [id number] Patient Account Number: [id number] Date of Birth/Sex: [birth year] (71 y.o. Female) Treating RN: Cornell Barman Primary Care Provider: Viviana Simpler Other Clinician: Referring Provider: Viviana Simpler Treating Provider/Extender: Tito Dine in Treatment: 6 Subjective History of Present Illness (HPI) 09/30/17; patient is a 72 year old woman with type 2 diabetes and peripheral neuropathy. On 08/30/17 she fell down some steps. She developed redness and swelling and was seen in her primary physician's office on 09/11/17. She was felt to have cellulitis and given Keflex. She will return for follow-up on 09/18/17 noted to have an 8 x 4 cm contusion on the left lateral lower leg and a 3 x 3 cm contusion on the right medial lower leg. At follow-up on 09/29/16 she had a 4 x 4 centimeter necrotic surface and she's been referred here. The patient is not on blood thinners she does not have a history of falling. She states her diabetes is under good control ABIs in this clinic could not be obtained because of pain. Patient does not have a prior wound history 10/07/17; the patient came back to the clinic last week for a nurse rewrap. Noted that the area on the right medial leg had opened as well. She is using silver alginate under compression.  The substantial wound on the left anterior leg is malodorous today. I did a culture of the wound surface but no empiric antibiotics. The area on the right lateral leg is open with a small draining open area with gelatinous old blood. Using pickups and scalpel I remove necrotic surface from around this area over the entire area, pulse fairly substantially inferiorly. We've been  using silver alginate to both wound areas 10/14/17; patient still has substantial drainage from these areas although they actually look some better. The area on the right lateral leg is opened up and there is depth of this. The area on the left leg is larger but more superficial. This area hasn't bridged area of skin that I thought I would have to removed however the granulation as come up and I'm wondering whether we can simply watch this for now these both were originally hematomas from trauma. CULTURE I did last week showed Escherichia coli and Pseudomonas both sensitive to ciprofloxacin. This was called in for her to replace the doxycycline I prescribed empirically 10/21/17; patient is making reasonably good progress in both wound areas include using silver alginate. Paradoxically the larger original wound area actually looks better. There is no evidence of infection. 10/28/17; patient continues to make excellent progress in both wound areas using silver alginate. I have been pleasantly surprised by the progress. I note ABIs of 1.25 on the right and 1.24 on the left. 11/04/17. Both the patient's wound area on the medial right calf and on the left anterior calf continue to make rapid progress. We have been using silver alginate 11/11/17; both of the patient's wounds continue to make nice progress using silver alginate. Objective Constitutional Sitting or standing Blood Pressure is within target range for patient.. Pulse regular and within target range for patient.Marland Kitchen Respirations regular, non-labored and within target range..  Temperature is normal and within the target range for the patient.Marland Kitchen appears in no distress. Vitals Time Taken: 2:13 PM, Height: 61 in, Weight: 179.1 lbs, BMI: 33.8, Temperature: 97.9 F, Pulse: 99 bpm, Respiratory [name], [name] ([id number]) Rate: 18 breaths/min, Blood Pressure: 136/65 mmHg. Eyes Conjunctivae clear. No discharge. Respiratory Respiratory effort is easy and symmetric bilaterally. Rate is normal at rest and on room air.. Cardiovascular Pedal pulses palpable and strong bilaterally.Marland Kitchen Psychiatric No evidence of depression, anxiety, or agitation. Calm, cooperative, and communicative. Appropriate interactions and affect.. General Notes: wound exam; we continue have nice progress in both wound areas. The area on the left calf divided by increasingly healthy epithelialization and healing. No debridement is required on either side On the right the medial calf the depth of this seems to come and nicely again healthy-looking tissue at the base of this wound Integumentary (Hair, Skin) no rash. Wound #1 status is Open. Original cause of wound was Trauma. The wound is located on the Right,Medial Lower Leg. The wound measures 1.5cm length x 1.5cm width x 0.3cm depth; 1.767cm^2 area and 0.53cm^3 volume. Wound #2 status is Open. Original cause of wound was Trauma. The wound is located on the Left,Lateral Lower Leg. The wound measures 5.4cm length x 1.2cm width x 0.3cm depth; 5.089cm^2 area and 1.527cm^3 volume. 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 Cleanse wound with mild soap  and water Wound #2 Left,Lateral Lower Leg: Clean wound with Normal Saline. Cleanse wound with mild soap and water Anesthetic (add to Medication List): Wound #1 Right,Medial Lower Leg: [name], [name]. ([id number]) Topical Lidocaine 4% cream applied to wound bed prior to debridement (In Clinic Only). Hurricaine Topical Anesthetic Spray applied to  wound bed prior to debridement (In Clinic Only). Wound #2 Left,Lateral Lower Leg: Topical Lidocaine 4% cream applied to wound bed prior to debridement (In Clinic Only). Hurricaine Topical Anesthetic Spray applied to wound bed prior to debridement (In Clinic Only). Primary Wound Dressing: Wound #1 Right,Medial Lower Leg: Silvercel Non-Adherent Wound #2 Left,Lateral Lower Leg: Silvercel Non-Adherent Secondary Dressing: Wound #1 Right,Medial Lower Leg: ABD pad Other - charcoal Wound #2 Left,Lateral Lower Leg: ABD pad Other - charcoal Dressing Change Frequency: Wound #1 Right,Medial Lower Leg: Other: - twice a week Wound #2 Left,Lateral Lower Leg: Other: - twice a week Follow-up Appointments: Wound #1 Right,Medial Lower Leg: Return Appointment in 1 week. Nurse Visit as needed Wound #2 Left,Lateral Lower Leg: Return Appointment in 1 week. Nurse Visit as needed Edema Control: Wound #1 Right,Medial Lower Leg: Kerlix and Coban - Bilateral - unna to anchor Elevate legs to the level of the heart and pump ankles as often as possible Wound #2 Left,Lateral Lower Leg: Kerlix and Coban - Bilateral - unna to anchor Elevate legs to the level of the heart and pump ankles as often as possible Additional Orders / Instructions: Wound #1 Right,Medial Lower Leg: Increase protein intake. Other: - Vitamin C, Zinc Wound #2 Left,Lateral Lower Leg: Increase protein intake. Other: - Vitamin C, Zinc The following medication(s) was prescribed: lidocaine topical 4 % cream cream topical was prescribed at facility [facility] at this point the patient is made  surprising improvement with silver alginate. At one point I was expecting to have to use skin substitutes but this is clearly not going to be necessary.ABD's/kerlix and Event organiser) Signed: 11/11/2017 5:10:26 PM By: Linton Ham MD [name] ([id number]) Entered By: Linton Ham on 11/11/2017 14:41:44 [name] ([id number]) -------------------------------------------------------------------------------- SuperBill Details Patient Name: [name]. Date of Service: 11/11/2017 Medical Record Number: [id number] Patient Account Number: [id number] Date of Birth/Sex: [birth year] (72 y.o. Female) Treating RN: Cornell Barman Primary Care Provider: Viviana Simpler Other Clinician: Referring Provider: Viviana Simpler Treating Provider/Extender: Tito Dine in Treatment: 6 Diagnosis Coding ICD-10 Codes Code Description [phone] 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e L97.218 Non-pressure chronic ulcer of right calf with other specified severity E11.40 Type 2 diabetes mellitus with diabetic neuropathy, unspecified I87.331 Chronic venous hypertension (idiopathic) with ulcer and inflammation of right lower extremity Physician Procedures CPT4 Code: [id number] Description: [id number] - WC PHYS LEVEL 3 - EST PT ICD-10 Diagnosis Description 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 L97.218 Non-pressure chronic ulcer of right calf with other specifie Modifier: cle d severity Quantity: 1 Electronic Signature(s) Signed: 11/11/2017 5:10:26 PM By: Linton Ham MD Entered By: Linton Ham on 11/11/2017 14:42:08

## 2017-11-17 DIAGNOSIS — E119 Type 2 diabetes mellitus without complications: Secondary | ICD-10-CM | POA: Diagnosis not present

## 2017-11-17 LAB — HM DIABETES EYE EXAM

## 2017-11-18 ENCOUNTER — Encounter: Payer: Medicare Other | Admitting: Internal Medicine

## 2017-11-18 ENCOUNTER — Encounter: Payer: Self-pay | Admitting: Internal Medicine

## 2017-11-18 DIAGNOSIS — S81801A Unspecified open wound, right lower leg, initial encounter: Secondary | ICD-10-CM | POA: Diagnosis not present

## 2017-11-18 DIAGNOSIS — L97218 Non-pressure chronic ulcer of right calf with other specified severity: Secondary | ICD-10-CM | POA: Diagnosis not present

## 2017-11-18 DIAGNOSIS — S81802A Unspecified open wound, left lower leg, initial encounter: Secondary | ICD-10-CM | POA: Diagnosis not present

## 2017-11-18 DIAGNOSIS — E1140 Type 2 diabetes mellitus with diabetic neuropathy, unspecified: Secondary | ICD-10-CM | POA: Diagnosis not present

## 2017-11-18 DIAGNOSIS — L97223 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e: Secondary | ICD-10-CM | POA: Diagnosis not present

## 2017-11-18 DIAGNOSIS — I10 Essential (primary) hypertension: Secondary | ICD-10-CM | POA: Diagnosis not present

## 2017-11-18 DIAGNOSIS — E11621 Type 2 diabetes mellitus with foot ulcer: Secondary | ICD-10-CM | POA: Diagnosis not present

## 2017-11-18 DIAGNOSIS — I87331 Chronic venous hypertension (idiopathic) with ulcer and inflammation of right lower extremity: Secondary | ICD-10-CM | POA: Diagnosis not present

## 2017-11-20 ENCOUNTER — Ambulatory Visit (INDEPENDENT_AMBULATORY_CARE_PROVIDER_SITE_OTHER): Payer: Medicare Other | Admitting: Psychiatry

## 2017-11-20 ENCOUNTER — Encounter: Payer: Self-pay | Admitting: Internal Medicine

## 2017-11-20 ENCOUNTER — Other Ambulatory Visit: Payer: Self-pay

## 2017-11-20 ENCOUNTER — Encounter: Payer: Self-pay | Admitting: Psychiatry

## 2017-11-20 VITALS — BP 143/82 | HR 81 | Temp 98.4°F | Wt 175.6 lb

## 2017-11-20 DIAGNOSIS — F320 Major depressive disorder, single episode, mild: Secondary | ICD-10-CM

## 2017-11-20 MED ORDER — DULOXETINE HCL 60 MG PO CPEP: 60.0000 mg | ORAL_CAPSULE | Freq: Every evening | ORAL | 3 refills | Status: DC

## 2017-11-20 MED ORDER — BUPROPION HCL 75 MG PO TABS: 75.0000 mg | ORAL_TABLET | Freq: Every day | ORAL | 2 refills | Status: DC

## 2017-11-20 NOTE — Progress Notes (Signed)
[name], [name] [place] ([id number]) Visit Report for 11/18/2017 Arrival Information Details Patient Name: [name], [name]. Date of Service: 11/18/2017 2:00 PM Medical Record Number: [id number] Patient Account Number: [id number] Date of Birth/Sex: 08/30/1946 (71 y.o. Female) Treating RN: Ahmed Prima Primary Care Shella Lahman: Viviana Simpler Other Clinician: Referring Peytyn Trine: Viviana Simpler Treating Nyzir Dubois/Extender: Tito Dine in Treatment: 7 Visit Information History Since Last Visit All ordered tests and consults were completed: No Patient Arrived: Ambulatory Added or deleted any medications: No Arrival Time: 14:17 Any new allergies or adverse reactions: No Accompanied By: self Had a fall or experienced change in No Transfer Assistance: None activities of daily living that may affect Patient Identification Verified: Yes risk of falls: Secondary Verification Process Completed: Yes Signs or symptoms of abuse/neglect since last visito No Patient Requires Transmission-Based No Hospitalized since last visit: No Precautions: Has Dressing in Place as Prescribed: Yes Patient Has Alerts: Yes Has Compression in Place as Prescribed: Yes Patient Alerts: DM II Pain Present Now: No Electronic Signature(s) Signed: 11/18/2017 4:40:08 PM By: Alric Quan Entered By: Alric Quan on 11/18/2017 14:19:16 [name], [name] ([id number]) -------------------------------------------------------------------------------- Clinic Level of Care Assessment Details Patient Name: [name]. Date of Service: 11/18/2017 2:00 PM Medical Record Number: [id number] Patient Account Number: [id number] Date of Birth/Sex: April 09, 1946 (71 y.o. Female) Treating RN: Cornell Barman Primary Care Joshue Badal: Viviana Simpler Other Clinician: Referring Basil Blakesley: Viviana Simpler Treating Narmeen Kerper/Extender: Tito Dine in Treatment: 7 Clinic Level of Care Assessment Items TOOL 4 Quantity  Score []  - Use when only an EandM is performed on FOLLOW-UP visit 0 ASSESSMENTS - Nursing Assessment / Reassessment []  - Reassessment of Co-morbidities (includes updates in patient status) 0 X- 1 5 Reassessment of Adherence to Treatment Plan ASSESSMENTS - Wound and Skin Assessment / Reassessment X - Simple Wound Assessment / Reassessment - one wound 1 5 []  - 0 Complex Wound Assessment / Reassessment - multiple wounds []  - 0 Dermatologic / Skin Assessment (not related to wound area) ASSESSMENTS - Focused Assessment []  - Circumferential Edema Measurements - multi extremities 0 []  - 0 Nutritional Assessment / Counseling / Intervention []  - 0 Lower Extremity Assessment (monofilament, tuning fork, pulses) []  - 0 Peripheral Arterial Disease Assessment (using hand held doppler) ASSESSMENTS - Ostomy and/or Continence Assessment and Care []  - Incontinence Assessment and Management 0 []  - 0 Ostomy Care Assessment and Management (repouching, etc.) PROCESS - Coordination of Care X - Simple Patient / Family Education for ongoing care 1 15 []  - 0 Complex (extensive) Patient / Family Education for ongoing care []  - 0 Staff obtains Programmer, systems, Records, Test Results / Process Orders []  - 0 Staff telephones HHA, Nursing Homes / Clarify orders / etc []  - 0 Routine Transfer to another Facility (non-emergent condition) []  - 0 Routine Hospital Admission (non-emergent condition) []  - 0 New Admissions / Biomedical engineer / Ordering NPWT, Apligraf, etc. []  - 0 Emergency Hospital Admission (emergent condition) X- 1 10 Simple Discharge Coordination [name], CALA. ([id number]) []  - 0 Complex (extensive) Discharge Coordination PROCESS - Special Needs []  - Pediatric / Minor Patient Management 0 []  - 0 Isolation Patient Management []  - 0 Hearing / Language / Visual special needs []  - 0 Assessment of Community assistance (transportation, D/C planning, etc.) []  - 0 Additional assistance /  Altered mentation []  - 0 Support Surface(s) Assessment (bed, cushion, seat, etc.) INTERVENTIONS - Wound Cleansing / Measurement X - Simple Wound Cleansing - one wound 1 5 []  - 0 Complex Wound Cleansing -  multiple wounds X- 1 5 Wound Imaging (photographs - any number of wounds) []  - 0 Wound Tracing (instead of photographs) X- 1 5 Simple Wound Measurement - one wound []  - 0 Complex Wound Measurement - multiple wounds INTERVENTIONS - Wound Dressings []  - Small Wound Dressing one or multiple wounds 0 []  - 0 Medium Wound Dressing one or multiple wounds X- 2 20 Large Wound Dressing one or multiple wounds []  - 0 Application of Medications - topical []  - 0 Application of Medications - injection INTERVENTIONS - Miscellaneous []  - External ear exam 0 []  - 0 Specimen Collection (cultures, biopsies, blood, body fluids, etc.) []  - 0 Specimen(s) / Culture(s) sent or taken to Lab for analysis []  - 0 Patient Transfer (multiple staff / Civil Service fast streamer / Similar devices) []  - 0 Simple Staple / Suture removal (25 or less) []  - 0 Complex Staple / Suture removal (26 or more) []  - 0 Hypo / Hyperglycemic Management (close monitor of Blood Glucose) []  - 0 Ankle / Brachial Index (ABI) - do not check if billed separately X- 1 5 Vital Signs [name], [name] ([id number]) Has the patient been seen at the hospital within the last three years: Yes Total Score: 95 Level Of Care: New/Established - Level 3 Electronic Signature(s) Signed: 11/18/2017 5:38:59 PM By: Gretta Cool, BSN, RN, CWS, Kim RN, BSN Entered By: Gretta Cool, BSN, RN, CWS, Kim on 11/18/2017 14:55:23 [name], [name] ([id number]) -------------------------------------------------------------------------------- Encounter Discharge Information Details Patient Name: [name]. Date of Service: 11/18/2017 2:00 PM Medical Record Number: [id number] Patient Account Number: [id number] Date of Birth/Sex: [birth year] (71 y.o. Female) Treating RN: Cornell Barman Primary Care Victory Dresden: Viviana Simpler Other Clinician: Referring Jhania Etherington: Viviana Simpler Treating Peyson Postema/Extender: Tito Dine in Treatment: 7 Encounter Discharge Information Items Discharge Pain Level: 0 Discharge Condition: Stable Ambulatory Status: Ambulatory Discharge Destination: Home Transportation: Private Auto Accompanied By: self Schedule Follow-up Appointment: Yes Medication Reconciliation completed and No provided to Patient/Care Avrian Delfavero: Provided on Clinical Summary of Care: 11/18/2017 Form Type Recipient Paper Patient LK Electronic Signature(s) Signed: 11/18/2017 5:19:26 PM By: Montey Hora Entered By: Montey Hora on 11/18/2017 17:07:20 [name], [name] ([id number]) -------------------------------------------------------------------------------- Lower Extremity Assessment Details Patient Name: [name]. Date of Service: 11/18/2017 2:00 PM Medical Record Number: [id number] Patient Account Number: [id number] Date of Birth/Sex: Mar 21, 1946 (71 y.o. Female) Treating RN: Carolyne Fiscal, Debi Primary Care Lolly Glaus: Viviana Simpler Other Clinician: Referring Kristyana Notte: Viviana Simpler Treating Mertie Haslem/Extender: Tito Dine in Treatment: 7 Edema Assessment Assessed: [Left: No] [Right: No] [Left: Edema] [Right: :] Calf Left: Right: Point of Measurement: 34 cm From Medial Instep 35.3 cm 36 cm Ankle Left: Right: Point of Measurement: 9 cm From Medial Instep 24 cm 23.3 cm Vascular Assessment Pulses: Dorsalis Pedis Palpable: [Left:Yes] [Right:Yes] Posterior Tibial Extremity colors, hair growth, and conditions: Extremity Color: [Left:Hyperpigmented] [Right:Hyperpigmented] Capillary Refill: [Left:< 3 seconds] [Right:< 3 seconds] Toe Nail Assessment Left: Right: Thick: Yes Yes Discolored: No No Deformed: No No Improper Length and Hygiene: Yes Yes Electronic Signature(s) Signed: 11/18/2017 4:40:08 PM By: Alric Quan Entered By: Alric Quan on 11/18/2017 14:35:10 [name], [name] ([id number]) -------------------------------------------------------------------------------- Multi Wound Chart Details Patient Name: [name]. Date of Service: 11/18/2017 2:00 PM Medical Record Number: [id number] Patient Account Number: [id number] Date of Birth/Sex: 04-02-1946 (71 y.o. Female) Treating RN: Cornell Barman Primary Care Luisfelipe [place]: Viviana Simpler Other Clinician: Referring Kaleel Schmieder: Viviana Simpler Treating Dorthia Tout/Extender: Tito Dine in Treatment: 7 Vital Signs Height(in): 61 Pulse(bpm): 87 Weight(lbs): 179.1 Blood Pressure(mmHg):  135/82 Body Mass Index(BMI): 34 Temperature(F): 98.2 Respiratory Rate 18 (breaths/min): Photos: [1:No Photos] [2:No Photos] [N/A:N/A] Wound Location: [1:Right Lower Leg - Medial] [2:Left Lower Leg - Lateral] [N/A:N/A] Wounding Event: [1:Trauma] [2:Trauma] [N/A:N/A] Primary Etiology: [1:Diabetic Wound/Ulcer of the Lower Extremity] [2:Diabetic Wound/Ulcer of the Lower Extremity] [N/A:N/A] Secondary Etiology: [1:Trauma, Other] [2:Trauma, Other] [N/A:N/A] Comorbid History: [1:Hypertension, Type II Diabetes, Osteoarthritis, Neuropathy] [2:Hypertension, Type II Diabetes, Osteoarthritis, Neuropathy] [N/A:N/A] Date Acquired: [1:08/30/2017] [2:08/30/2017] [N/A:N/A] Weeks of Treatment: [1:7] [2:7] [N/A:N/A] Wound Status: [1:Open] [2:Open] [N/A:N/A] Measurements L x W x D [1:1.4x1x0.3] [2:5.2x1x0.3] [N/A:N/A] (cm) Area (cm) : [1:1.1] [2:4.084] [N/A:N/A] Volume (cm) : [1:0.33] [2:1.225] [N/A:N/A] % Reduction in Area: [1:86.70%] [2:79.40%] [N/A:N/A] % Reduction in Volume: [1:60.00%] [2:91.20%] [N/A:N/A] Classification: [1:Grade 2] [2:Grade 2] [N/A:N/A] Exudate Amount: [1:Large] [2:Large] [N/A:N/A] Exudate Type: [1:Serosanguineous] [2:Serosanguineous] [N/A:N/A] Exudate Color: [1:red, brown] [2:red, brown] [N/A:N/A] Wound Margin: [1:Distinct, outline  attached] [2:Distinct, outline attached] [N/A:N/A] Granulation Amount: [1:Medium (34-66%)] [2:Large (67-100%)] [N/A:N/A] Granulation Quality: [1:Red] [2:Red] [N/A:N/A] Necrotic Amount: [1:Medium (34-66%)] [2:Small (1-33%)] [N/A:N/A] Exposed Structures: [1:Fascia: No Fat Layer (Subcutaneous Tissue) Exposed: No Tendon: No Muscle: No Joint: No Bone: No] [2:Fascia: No Fat Layer (Subcutaneous Tissue) Exposed: No Tendon: No Muscle: No Joint: No Bone: No] [N/A:N/A] Epithelialization: [1:N/A] [2:None] [N/A:N/A] Periwound Skin Texture: [1:No Abnormalities Noted] [2:No Abnormalities Noted] [N/A:N/A] Periwound Skin Moisture: [1:No Abnormalities Noted] [2:No Abnormalities Noted] [N/A:N/A] Periwound Skin Color: [1:No Abnormalities Noted] [2:No Abnormalities Noted] [N/A:N/A] Temperature: No Abnormality No Abnormality N/A Tenderness on Palpation: Yes Yes N/A Wound Preparation: Ulcer Cleansing: Ulcer Cleansing: N/A Rinsed/Irrigated with Saline Rinsed/Irrigated with Saline Topical Anesthetic Applied: Topical Anesthetic Applied: Other: lidocaine 4% Other: lidocaine 4% Treatment Notes Electronic Signature(s) Signed: 11/18/2017 5:42:24 PM By: Linton Ham MD Entered By: Linton Ham on 11/18/2017 15:16:11 [name], [name] ([id number]) -------------------------------------------------------------------------------- [place] Details Patient Name: [name]. Date of Service: 11/18/2017 2:00 PM Medical Record Number: [id number] Patient Account Number: [id number] Date of Birth/Sex: 06-Oct-1945 (71 y.o. Female) Treating RN: Cornell Barman Primary Care Wolfe Camarena: Viviana Simpler Other Clinician: Referring Annika Selke: Viviana Simpler Treating Deiontae Rabel/Extender: Tito Dine in Treatment: 7 Active Inactive ` Abuse / Safety / Falls / Self Care Management Nursing Diagnoses: History of Falls Potential for falls Goals: Patient will not experience any injury related to  falls Date Initiated: 09/30/2017 Target Resolution Date: 01/16/2018 Goal Status: Active Interventions: Assess Activities of Daily Living upon admission and as needed Assess fall risk on admission and as needed Assess: immobility, friction, shearing, incontinence upon admission and as needed Assess impairment of mobility on admission and as needed per policy Notes: ` Nutrition Nursing Diagnoses: Imbalanced nutrition Impaired glucose control: actual or potential Potential for alteratiion in Nutrition/Potential for imbalanced nutrition Goals: Patient/caregiver agrees to and verbalizes understanding of need to use nutritional supplements and/or vitamins as prescribed Date Initiated: 09/30/2017 Target Resolution Date: 01/16/2018 Goal Status: Active Patient/caregiver will maintain therapeutic glucose control Date Initiated: 09/30/2017 Target Resolution Date: 01/16/2018 Goal Status: Active Interventions: Assess patient nutrition upon admission and as needed per policy Provide education on elevated blood sugars and impact on wound healing Notes: MERIDIAN, SCHERGER ([id number]) Orientation to the Wound Care Program Nursing Diagnoses: Knowledge deficit related to the wound healing center program Goals: Patient/caregiver will verbalize understanding of the [place] Date Initiated: 09/30/2017 Target Resolution Date: 10/17/2017 Goal Status: Active Interventions: Provide education on orientation to the wound center Notes: ` Pain, Acute or Chronic Nursing Diagnoses: Pain, acute or chronic: actual or potential Potential alteration in comfort, pain  Goals: Patient/caregiver will verbalize adequate pain control between visits Date Initiated: 09/30/2017 Target Resolution Date: 01/16/2018 Goal Status: Active Interventions: Complete pain assessment as per visit requirements Notes: ` Wound/Skin Impairment Nursing Diagnoses: Impaired tissue integrity Knowledge deficit  related to ulceration/compromised skin integrity Goals: Ulcer/skin breakdown will have a volume reduction of 80% by week 12 Date Initiated: 09/30/2017 Target Resolution Date: 01/16/2018 Goal Status: Active Interventions: Assess patient/caregiver ability to perform ulcer/skin care regimen upon admission and as needed Assess ulceration(s) every visit Notes: Electronic Signature(s) Signed: 11/18/2017 5:38:59 PM By: Gretta Cool, BSN, RN, CWS, Kim RN, BSN Entered By: Gretta Cool, BSN, RN, CWS, Kim on 11/18/2017 14:52:14 [name], [name] ([id number]) [address], Share[place] M. ([id number]) -------------------------------------------------------------------------------- Pain Assessment Details Patient Name: [name]. Date of Service: 11/18/2017 2:00 PM Medical Record Number: [id number] Patient Account Number: [id number] Date of Birth/Sex: 11-20-1945 (71 y.o. Female) Treating RN: Carolyne Fiscal, Debi Primary Care Shaylea Ucci: Viviana Simpler Other Clinician: Referring Azaylah Stailey: Viviana Simpler Treating Adrine Hayworth/Extender: Tito Dine in Treatment: 7 Active Problems Location of Pain Severity and Description of Pain Patient Has Paino No Site Locations Pain Management and Medication Current Pain Management: Electronic Signature(s) Signed: 11/18/2017 4:40:08 PM By: Alric Quan Entered By: Alric Quan on 11/18/2017 14:19:21 [name], [name] ([id number]) -------------------------------------------------------------------------------- Patient/Caregiver Education Details Patient Name: [name]. Date of Service: 11/18/2017 2:00 PM Medical Record Number: [id number] Patient Account Number: [id number] Date of Birth/Gender: 01/14/1946 (71 y.o. Female) Treating RN: Montey Hora Primary Care Physician: Viviana Simpler Other Clinician: Referring Physician: Viviana Simpler Treating Physician/Extender: Tito Dine in Treatment: 7 Education Assessment Education Provided  To: Patient Education Topics Provided Venous: Handouts: Other: leg elevation Methods: Explain/Verbal Responses: State content correctly Electronic Signature(s) Signed: 11/18/2017 5:19:26 PM By: Montey Hora Entered By: Montey Hora on 11/18/2017 17:07:39 [name], [name] ([id number]) -------------------------------------------------------------------------------- Wound Assessment Details Patient Name: [name]. Date of Service: 11/18/2017 2:00 PM Medical Record Number: [id number] Patient Account Number: [id number] Date of Birth/Sex: 04-Aug-1946 (71 y.o. Female) Treating RN: Carolyne Fiscal, Debi Primary Care Tanish Sinkler: Viviana Simpler Other Clinician: Referring Royalti Schauf: Viviana Simpler Treating Kushi Kun/Extender: Tito Dine in Treatment: 7 Wound Status Wound Number: 1 Primary Etiology: Diabetic Wound/Ulcer of the Lower Extremity Wound Location: Right Lower Leg - Medial Secondary Trauma, Other Wounding Event: Trauma Etiology: Date Acquired: 08/30/2017 Wound Status: Open Weeks Of Treatment: 7 Comorbid Hypertension, Type II Diabetes, Clustered Wound: No History: Osteoarthritis, Neuropathy Photos Photo Uploaded By: Alric Quan on 11/18/2017 17:16:20 Wound Measurements Length: (cm) 1.4 Width: (cm) 1 Depth: (cm) 0.3 Area: (cm) 1.1 Volume: (cm) 0.33 % Reduction in Area: 86.7% % Reduction in Volume: 60% Tunneling: No Undermining: No Wound Description Classification: Grade 2 Wound Margin: Distinct, outline attached Exudate Amount: Large Exudate Type: Serosanguineous Exudate Color: red, brown Foul Odor After Cleansing: No Slough/Fibrino Yes Wound Bed Granulation Amount: Medium (34-66%) Exposed Structure Granulation Quality: Red Fascia Exposed: No Necrotic Amount: Medium (34-66%) Fat Layer (Subcutaneous Tissue) Exposed: No Necrotic Quality: Adherent Slough Tendon Exposed: No Muscle Exposed: No Joint Exposed: No Bone Exposed: No Periwound  Skin Texture Pfohl, Kayin M. ([id number]) Texture Color No Abnormalities Noted: No No Abnormalities Noted: No Moisture Temperature / Pain No Abnormalities Noted: No Temperature: No Abnormality Tenderness on Palpation: Yes Wound Preparation Ulcer Cleansing: Rinsed/Irrigated with Saline Topical Anesthetic Applied: Other: lidocaine 4%, Treatment Notes Wound #1 (Right, Medial Lower Leg) 1. Cleansed with: Clean wound with Normal Saline Cleanse wound with antibacterial soap and water 2. Anesthetic Topical Lidocaine 4% cream to wound bed prior to debridement  4. Dressing Applied: Other dressing (specify in notes) 5. Secondary Dressing Applied ABD Pad Dry Gauze Kerlix/Conform Notes silvercel, unna to anchor, kerlix, coban Electronic Signature(s) Signed: 11/18/2017 4:40:08 PM By: Alric Quan Entered By: Alric Quan on 11/18/2017 14:33:27 [place], [name] ([id number]) -------------------------------------------------------------------------------- Wound Assessment Details Patient Name: [name]. Date of Service: 11/18/2017 2:00 PM Medical Record Number: [id number] Patient Account Number: [id number] Date of Birth/Sex: 1946-07-23 (71 y.o. Female) Treating RN: Carolyne Fiscal, Debi Primary Care Shameria Trimarco: Viviana Simpler Other Clinician: Referring Zeyad Delaguila: Viviana Simpler Treating Sheri Gatchel/Extender: Tito Dine in Treatment: 7 Wound Status Wound Number: 2 Primary Etiology: Diabetic Wound/Ulcer of the Lower Extremity Wound Location: Left Lower Leg - Lateral Secondary Trauma, Other Wounding Event: Trauma Etiology: Date Acquired: 08/30/2017 Wound Status: Open Weeks Of Treatment: 7 Comorbid Hypertension, Type II Diabetes, Clustered Wound: No History: Osteoarthritis, Neuropathy Photos Photo Uploaded By: Alric Quan on 11/18/2017 17:16:57 Wound Measurements Length: (cm) 5.2 Width: (cm) 1 Depth: (cm) 0.3 Area: (cm) 4.084 Volume: (cm) 1.225 %  Reduction in Area: 79.4% % Reduction in Volume: 91.2% Epithelialization: None Tunneling: No Undermining: No Wound Description Classification: Grade 2 Wound Margin: Distinct, outline attached Exudate Amount: Large Exudate Type: Serosanguineous Exudate Color: red, brown Foul Odor After Cleansing: No Slough/Fibrino Yes Wound Bed Granulation Amount: Large (67-100%) Exposed Structure Granulation Quality: Red Fascia Exposed: No Necrotic Amount: Small (1-33%) Fat Layer (Subcutaneous Tissue) Exposed: No Necrotic Quality: Adherent Slough Tendon Exposed: No Muscle Exposed: No Joint Exposed: No Bone Exposed: No Periwound Skin Texture Bierlein, Akacia M. ([id number]) Texture Color No Abnormalities Noted: No No Abnormalities Noted: No Moisture Temperature / Pain No Abnormalities Noted: No Temperature: No Abnormality Tenderness on Palpation: Yes Wound Preparation Ulcer Cleansing: Rinsed/Irrigated with Saline Topical Anesthetic Applied: Other: lidocaine 4%, Treatment Notes Wound #2 (Left, Lateral Lower Leg) 1. Cleansed with: Clean wound with Normal Saline Cleanse wound with antibacterial soap and water 2. Anesthetic Topical Lidocaine 4% cream to wound bed prior to debridement 4. Dressing Applied: Other dressing (specify in notes) 5. Secondary Dressing Applied ABD Pad Dry Gauze Kerlix/Conform Notes silvercel, unna to anchor, kerlix, coban Electronic Signature(s) Signed: 11/18/2017 4:40:08 PM By: Alric Quan Entered By: Alric Quan on 11/18/2017 14:32:04 [name] ([id number]) -------------------------------------------------------------------------------- Vitals Details Patient Name: [name]. Date of Service: 11/18/2017 2:00 PM Medical Record Number: [id number] Patient Account Number: [id number] Date of Birth/Sex: [birth year] (71 y.o. Female) Treating RN: Carolyne Fiscal, Debi Primary Care Sherod Cisse: Viviana Simpler Other Clinician: Referring Tanylah Schnoebelen:  Viviana Simpler Treating Viviano Bir/Extender: Tito Dine in Treatment: 7 Vital Signs Time Taken: 14:19 Temperature (F): 98.2 Height (in): 61 Pulse (bpm): 87 Weight (lbs): 179.1 Respiratory Rate (breaths/min): 18 Body Mass Index (BMI): 33.8 Blood Pressure (mmHg): 135/82 Reference Range: 80 - 120 mg / dl Electronic Signature(s) Signed: 11/18/2017 4:40:08 PM By: Alric Quan Entered By: Alric Quan on 11/18/2017 14:21:39

## 2017-11-20 NOTE — Progress Notes (Signed)
ANWYN, KRIEGEL [place] ([id number]) Visit Report for 11/18/2017 HPI Details Patient Name: [name], [name]. Date of Service: 11/18/2017 2:00 PM Medical Record Number: [id number] Patient Account Number: [id number] Date of Birth/Sex: 1946/01/12 (72 y.o. Female) Treating RN: Cornell Barman Primary Care Provider: Viviana Simpler Other Clinician: Referring Provider: Viviana Simpler Treating Provider/Extender: Tito Dine in Treatment: 7 History of Present Illness HPI Description: 09/30/17; patient is a 72 year old woman with type 2 diabetes and peripheral neuropathy. On 08/30/17 she fell down some steps. She developed redness and swelling and was seen in her primary physician's office on 09/11/17. She was felt to have cellulitis and given Keflex. She will return for follow-up on 09/18/17 noted to have an 8 x 4 cm contusion on the left lateral lower leg and a 3 x 3 cm contusion on the right medial lower leg. At follow-up on 09/29/16 she had a 4 x 4 centimeter necrotic surface and she's been referred here. The patient is not on blood thinners she does not have a history of falling. She states her diabetes is under good control ABIs in this clinic could not be obtained because of pain. Patient does not have a prior wound history 10/07/17; the patient came back to the clinic last week for a nurse rewrap. Noted that the area on the right medial leg had opened as well. She is using silver alginate under compression. The substantial wound on the left anterior leg is malodorous today. I did a culture of the wound surface but no empiric antibiotics. The area on the right lateral leg is open with a small draining open area with gelatinous old blood. Using pickups and scalpel I remove necrotic surface from around this area over the entire area, pulse fairly substantially inferiorly. We've been using silver alginate to both wound areas 10/14/17; patient still has substantial drainage from these areas although they  actually look some better. The area on the right lateral leg is opened up and there is depth of this. The area on the left leg is larger but more superficial. This area hasn't bridged area of skin that I thought I would have to removed however the granulation as come up and I'm wondering whether we can simply watch this for now these both were originally hematomas from trauma. CULTURE I did last week showed Escherichia coli and Pseudomonas both sensitive to ciprofloxacin. This was called in for her to replace the doxycycline I prescribed empirically 10/21/17; patient is making reasonably good progress in both wound areas include using silver alginate. Paradoxically the larger original wound area actually looks better. There is no evidence of infection. 10/28/17; patient continues to make excellent progress in both wound areas using silver alginate. I have been pleasantly surprised by the progress. I note ABIs of 1.25 on the right and 1.24 on the left. 11/04/17. Both the patient's wound area on the medial right calf and on the left anterior calf continue to make rapid progress. We have been using silver alginate 11/11/17; both of the patient's wounds continue to make nice progress using silver alginate. 11/18/17; the patient is doing nicely using silver alginate under compression. She is leaving this on a week Electronic Signature(s) Signed: 11/18/2017 5:42:24 PM By: Linton Ham MD Entered By: Linton Ham on 11/18/2017 15:16:44 Mccamy, [name] ([id number]) -------------------------------------------------------------------------------- Physical Exam Details Patient Name: Alexis Morgans. Date of Service: 11/18/2017 2:00 PM Medical Record Number: [id number] Patient Account Number: [id number] Date of Birth/Sex: 1946/06/02 (72 y.o. Female) Treating RN: Cornell Barman Primary  Care Provider: Viviana Simpler Other Clinician: Referring Provider: Viviana Simpler Treating Provider/Extender: Tito Dine in Treatment: 7 Constitutional Sitting or standing Blood Pressure is within target range for patient.. Pulse regular and within target range for patient.Marland Kitchen Respirations regular, non-labored and within target range.. Temperature is normal and within the target range for the patient.Marland Kitchen appears in no distress. Eyes Conjunctivae clear. No discharge. Cardiovascular Pedal pulses palpable and strong bilaterally.. there is no edema. Lymphatic none palpable in the popliteal or inguinal area. Integumentary (Hair, Skin) there is no primary skin issue. Psychiatric No evidence of depression, anxiety, or agitation. Calm, cooperative, and communicative. Appropriate interactions and affect.. Notes wound exam; we continue to make nice progress in both wound areas. The area on the left has increasingly normal skin separating 2 small superficial wound areas with healthy surface granulation oThe area on the right is filled in nicely again healthy granulation oNo evidence of infection in either area Electronic Signature(s) Signed: 11/18/2017 5:42:24 PM By: Linton Ham MD Entered By: Linton Ham on 11/18/2017 15:18:29 Cohea, [name] ([id number]) -------------------------------------------------------------------------------- Physician Orders Details Patient Name: Alexis Morgans. Date of Service: 11/18/2017 2:00 PM Medical Record Number: [id number] Patient Account Number: [id number] Date of Birth/Sex: 12/03/1945 (72 y.o. Female) Treating RN: Cornell Barman Primary Care Provider: Viviana Simpler Other Clinician: Referring Provider: Viviana Simpler Treating Provider/Extender: Tito Dine in Treatment: 7 Verbal / Phone Orders: No Diagnosis Coding Wound Cleansing Wound #1 Right,Medial Lower Leg o Clean wound with Normal Saline. o Cleanse wound with mild soap and water Wound #2 Left,Lateral Lower Leg o Clean wound with Normal Saline. o Cleanse wound with mild  soap and water Anesthetic (add to Medication List) Wound #1 Right,Medial Lower Leg o Topical Lidocaine 4% cream applied to wound bed prior to debridement (In Clinic Only). o Hurricaine Topical Anesthetic Spray applied to wound bed prior to debridement (In Clinic Only). Wound #2 Left,Lateral Lower Leg o Topical Lidocaine 4% cream applied to wound bed prior to debridement (In Clinic Only). o Hurricaine Topical Anesthetic Spray applied to wound bed prior to debridement (In Clinic Only). Primary Wound Dressing Wound #1 Right,Medial Lower Leg o Silvercel Non-Adherent Wound #2 Left,Lateral Lower Leg o Silvercel Non-Adherent Secondary Dressing Wound #1 Right,Medial Lower Leg o ABD pad o Other - charcoal Wound #2 Left,Lateral Lower Leg o ABD pad o Other - charcoal Dressing Change Frequency Wound #1 Right,Medial Lower Leg o Other: - twice a week Wound #2 Left,Lateral Lower Leg o Other: - twice a week Follow-up Appointments Wound #1 Right,Medial Lower Leg [name], JORDAN. ([id number]) o Return Appointment in 1 week. o Nurse Visit as needed Wound #2 Left,Lateral Lower Leg o Return Appointment in 1 week. o Nurse Visit as needed Edema Control Wound #1 Right,Medial Lower Leg o Kerlix and Coban - Bilateral - unna to anchor o Elevate legs to the level of the heart and pump ankles as often as possible Wound #2 Left,Lateral Lower Leg o Kerlix and Coban - Bilateral - unna to anchor o Elevate legs to the level of the heart and pump ankles as often as possible Additional Orders / Instructions Wound #1 Right,Medial Lower Leg o Increase protein intake. o Other: - Vitamin C, Zinc Wound #2 Left,Lateral Lower Leg o Increase protein intake. o Other: - Vitamin C, Zinc Patient Medications Allergies: NKDA Notifications Medication Indication Start End lidocaine DOSE topical 4 % cream - cream topical Electronic Signature(s) Signed: 11/18/2017  5:38:59 PM By: Gretta Cool, BSN, RN, CWS, Kim RN, BSN  Signed: 11/18/2017 5:42:24 PM By: Linton Ham MD Entered By: Gretta Cool, BSN, RN, CWS, Kim on 11/18/2017 14:54:36 Pumphrey, [name] ([id number]) -------------------------------------------------------------------------------- Prescription 11/18/2017 Patient Name: Alexis Morgans. Provider: Ricard Dillon MD Date of Birth: 04-Aug-1946 NPI#: [id number] Sex: F DEA#: [id number] Phone #: [phone] License #: [id number] Patient Address: [address], [place], [place] [address] [place], [place] [address] [phone] Allergies NKDA Medication Medication: Route: Strength: Form: lidocaine topical 4% cream Class: TOPICAL LOCAL ANESTHETICS Dose: Frequency / Time: Indication: cream topical Number of Refills: Number of Units: 0 Generic Substitution: Start Date: End Date: Administered at [facility]: Yes Time Administered: Time Discontinued: Note to Pharmacy: Signature(s): Date(s): Electronic Signature(s) Signed: 11/18/2017 5:38:59 PM By: Gretta Cool, BSN, RN, CWS, Kim RN, BSN Signed: 11/18/2017 5:42:24 PM By: Linton Ham MD Entered By: Gretta Cool, BSN, RN, CWS, Kim on 11/18/2017 14:54:38 Taras, [name] ([id number]) Romana Juniper, Rashell M. ([id number]) --------------------------------------------------------------------------------  Problem List Details Patient Name: JILLISA, HARRIS. Date of Service: 11/18/2017 2:00 PM Medical Record Number: [id number] Patient Account Number: [id number] Date of Birth/Sex: 1946-06-25 (71 y.o. 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 09/30/2017 Yes L97.218 Non-pressure chronic ulcer of right calf with other specified severity 09/30/2017 Yes E11.40 Type 2 diabetes mellitus with diabetic neuropathy, unspecified 09/30/2017 Yes I87.331 Chronic venous hypertension (idiopathic) with ulcer and 10/07/2017 Yes inflammation of right lower extremity Inactive Problems Resolved Problems Electronic Signature(s) Signed: 11/18/2017 5:42:24 PM By: Linton Ham MD Entered By: Linton Ham on 11/18/2017 15:15:57 Augello, [name] ([id number]) -------------------------------------------------------------------------------- Progress Note Details Patient Name: Alexis Morgans. Date of Service: 11/18/2017 2:00 PM Medical Record Number: [id number] Patient Account Number: [id number] Date of Birth/Sex: 11/16/1945 (71 y.o. Female) Treating RN: Cornell Barman Primary Care Provider: Viviana Simpler Other Clinician: Referring Provider: Viviana Simpler Treating Provider/Extender: Tito Dine in Treatment: 7 Subjective History of Present Illness (HPI) 09/30/17; patient is a 72 year old woman with type 2 diabetes and peripheral neuropathy. On 08/30/17 she fell down some steps. She developed redness and swelling and was seen in her primary physician's office on 09/11/17. She was felt to have cellulitis and given Keflex. She will return for follow-up on 09/18/17 noted to have an 8 x 4 cm contusion on the left lateral lower leg and a 3 x 3 cm contusion on the right medial lower leg. At follow-up on 09/29/16 she had a 4 x 4 centimeter necrotic surface and she's been referred here. The patient is not on blood thinners she does not have a history of falling. She states her diabetes is under good control ABIs in this clinic could not be obtained because of pain. Patient does not have a prior wound history 10/07/17; the patient came back to the clinic last week for a nurse rewrap. Noted that the area on  the right medial leg had opened as well. She is using silver alginate under compression. The substantial wound on the left anterior leg is malodorous today. I did a culture of the wound surface but no empiric antibiotics. The area on the right lateral leg is open with a small draining open area with gelatinous old blood. Using pickups and scalpel I remove necrotic surface from around this  area over the entire area, pulse fairly substantially inferiorly. We've been using silver alginate to both wound areas 10/14/17; patient still has substantial drainage from these areas although they actually look some better. The area on the right lateral leg is opened up and there is depth of this. The area on the left leg is larger but more superficial. This area hasn't bridged area of skin that I thought I would have to removed however the granulation as come up and I'm wondering whether we can simply watch this for now these both were originally hematomas from trauma. CULTURE I did last week showed Escherichia coli and Pseudomonas both sensitive to ciprofloxacin. This was called in for her to replace the doxycycline I prescribed empirically 10/21/17; patient is making reasonably good progress in both wound areas include using silver alginate. Paradoxically the larger original wound area actually looks better. There is no evidence of infection. 10/28/17; patient continues to make excellent progress in both wound areas using silver alginate. I have been pleasantly surprised by the progress. I note ABIs of 1.25 on the right and 1.24 on the left. 11/04/17. Both the patient's wound area on the medial right calf and on the left anterior calf continue to make rapid progress. We have been using silver alginate 11/11/17; both of the patient's wounds continue to make nice progress using silver alginate. 11/18/17; the patient is doing nicely using silver alginate under compression. She is leaving this on a  week Objective Constitutional Sitting or standing Blood Pressure is within target range for patient.. Pulse regular and within target range for patient.Marland Kitchen Respirations regular, non-labored and within target range.. Temperature is normal and within the target range for the patient.Marland Kitchen appears in no distress. [place], [name] ([id number]) Vitals Time Taken: 2:19 PM, Height: 61 in, Weight: 179.1 lbs, BMI: 33.8, Temperature: 98.2 F, Pulse: 87 bpm, Respiratory Rate: 18 breaths/min, Blood Pressure: 135/82 mmHg. Eyes Conjunctivae clear. No discharge. Cardiovascular Pedal pulses palpable and strong bilaterally.. there is no edema. Lymphatic none palpable in the popliteal or inguinal area. Psychiatric No evidence of depression, anxiety, or agitation. Calm, cooperative, and communicative. Appropriate interactions and affect.. General Notes: wound exam; we continue to make nice progress in both wound areas. The area on the left has increasingly normal skin separating 2 small superficial wound areas with healthy surface granulation The area on the right is filled in nicely again healthy granulation No evidence of infection in either area Integumentary (Hair, Skin) there is no primary skin issue. Wound #1 status is Open. Original cause of wound was Trauma. The wound is located on the Right,Medial Lower Leg. The wound measures 1.4cm length x 1cm width x 0.3cm depth; 1.1cm^2 area and 0.33cm^3 volume. There is no tunneling or undermining noted. There is a large amount of serosanguineous drainage noted. The wound margin is distinct with the outline attached to the wound base. There is medium (34-66%) red granulation within the wound bed. There is a medium (34-66%) amount of necrotic tissue within the wound bed including Adherent Slough. Periwound temperature was noted as No Abnormality. The periwound has tenderness on palpation. Wound #2 status is Open. Original cause of wound was Trauma. The wound is  located on the Left,Lateral Lower Leg. The wound measures 5.2cm length x 1cm width x 0.3cm depth; 4.084cm^2 area and 1.225cm^3 volume. There is no tunneling or undermining noted. There is a large amount of serosanguineous drainage noted. The wound margin is distinct with the outline attached to the wound base. There is  large (67-100%) red granulation within the wound bed. There is a small (1-33%) amount of necrotic tissue within the wound bed including Adherent Slough. Periwound temperature was noted as No Abnormality. The periwound has tenderness on palpation. Assessment Active Problems ICD-10 L97.223 - 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e L97.218 - Non-pressure chronic ulcer of right calf with other specified severity E11.40 - Type 2 diabetes mellitus with diabetic neuropathy, unspecified I87.331 - Chronic venous hypertension (idiopathic) with ulcer and inflammation of right lower extremity Plan [name], DEBLANC. ([id number]) Wound Cleansing: Wound #1 Right,Medial Lower Leg: Clean wound with Normal Saline. Cleanse wound with mild soap and water Wound #2 Left,Lateral Lower Leg: Clean wound with Normal Saline. Cleanse wound with mild soap and water Anesthetic (add to Medication List): Wound #1 Right,Medial Lower Leg: Topical Lidocaine 4% cream applied to wound bed prior to debridement (In Clinic Only). Hurricaine Topical Anesthetic Spray applied to wound bed prior to debridement (In Clinic Only). Wound #2 Left,Lateral Lower Leg: Topical Lidocaine 4% cream applied to wound bed prior to debridement (In Clinic Only). Hurricaine Topical Anesthetic Spray applied to wound bed prior to debridement (In Clinic Only). Primary Wound Dressing: Wound #1 Right,Medial Lower Leg: Silvercel Non-Adherent Wound #2 Left,Lateral Lower Leg: Silvercel Non-Adherent Secondary Dressing: Wound #1 Right,Medial Lower Leg: ABD pad Other - charcoal Wound #2 Left,Lateral Lower  Leg: ABD pad Other - charcoal Dressing Change Frequency: Wound #1 Right,Medial Lower Leg: Other: - twice a week Wound #2 Left,Lateral Lower Leg: Other: - twice a week Follow-up Appointments: Wound #1 Right,Medial Lower Leg: Return Appointment in 1 week. Nurse Visit as needed Wound #2 Left,Lateral Lower Leg: Return Appointment in 1 week. Nurse Visit as needed Edema Control: Wound #1 Right,Medial Lower Leg: Kerlix and Coban - Bilateral - unna to anchor Elevate legs to the level of the heart and pump ankles as often as possible Wound #2 Left,Lateral Lower Leg: Kerlix and Coban - Bilateral - unna to anchor Elevate legs to the level of the heart and pump ankles as often as possible Additional Orders / Instructions: Wound #1 Right,Medial Lower Leg: Increase protein intake. Other: - Vitamin C, Zinc Wound #2 Left,Lateral Lower Leg: Increase protein intake. Other: - Vitamin C, Zinc The following medication(s) was prescribed: lidocaine topical 4 % cream cream topical was prescribed at facility [facility], Alexis LOSADA. ([id number]) #1 the patient continues to do very nicely with silver alginate under compression #2 there is absolutely no need to change her current treatment #3 these were initially trauma wounds/hematomas with depth they have filled in nicely. Electronic Signature(s) Signed: 11/18/2017 5:42:24 PM By: Linton Ham MD Entered By: Linton Ham on 11/18/2017 15:19:26 Reust, [name] ([id number]) -------------------------------------------------------------------------------- [place] Details Patient Name: Alexis Morgans. Date of Service: 11/18/2017 Medical Record Number: [id number] Patient Account Number: [id number] Date of Birth/Sex: Jan 15, 1946 (72 y.o. Female) Treating RN: Cornell Barman Primary Care Provider: Viviana Simpler Other Clinician: Referring Provider: Viviana Simpler Treating Provider/Extender: Tito Dine in Treatment: 7 Diagnosis Coding ICD-10  Codes Code Description [phone] 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e L97.218 Non-pressure chronic ulcer of right calf with other specified severity E11.40 Type 2 diabetes mellitus with diabetic neuropathy, unspecified I87.331 Chronic venous hypertension (idiopathic) with ulcer and inflammation of right lower extremity Facility Procedures CPT4 Code: [id number] Description: 99213 - WOUND CARE VISIT-LEV 3 EST PT Modifier: Quantity: 1 Physician Procedures CPT4 Code: [id number] Description: 92426 - WC PHYS LEVEL 3 - EST PT ICD-10 Diagnosis Description L97.223  Non-pressure chronic ulcer of left calf with necrosis of mus L97.218 Non-pressure chronic ulcer of right calf with other specifie Modifier: cle d severity Quantity: 1 Electronic Signature(s) Signed: 11/18/2017 5:42:24 PM By: Linton Ham MD Entered By: Linton Ham on 11/18/2017 15:19:51

## 2017-11-20 NOTE — Patient Instructions (Signed)
       Bupropion tablets (Depression/Mood Disorders) What is this medicine? BUPROPION (byoo PROE pee on) is used to treat depression. This medicine may be used for other purposes; ask your health care provider or pharmacist if you have questions. COMMON BRAND NAME(S): Wellbutrin What should I tell my health care provider before I take this medicine? They need to know if you have any of these conditions: -an eating disorder, such as anorexia or bulimia -bipolar disorder or psychosis -diabetes or high blood sugar, treated with medication -glaucoma -heart disease, previous heart attack, or irregular heart beat -head injury or brain tumor -high blood pressure -kidney or liver disease -seizures -suicidal thoughts or a previous suicide attempt -Tourette's syndrome -weight loss -an unusual or allergic reaction to bupropion, other medicines, foods, dyes, or preservatives -breast-feeding -pregnant or trying to become pregnant How should I use this medicine? Take this medicine by mouth with a glass of water. Follow the directions on the prescription label. You can take it with or without food. If it upsets your stomach, take it with food. Take your medicine at regular intervals. Do not take your medicine more often than directed. Do not stop taking this medicine suddenly except upon the advice of your doctor. Stopping this medicine too quickly may cause serious side effects or your condition may worsen. A special MedGuide will be given to you by the pharmacist with each prescription and refill. Be sure to read this information carefully each time. Talk to your pediatrician regarding the use of this medicine in children. Special care may be needed. Overdosage: If you think you have taken too much of this medicine contact a poison control center or emergency room at once. NOTE: This medicine is only for you. Do not share this medicine with others. What if I miss a dose? If you miss a dose,  take it as soon as you can. If it is less than four hours to your next dose, take only that dose and skip the missed dose. Do not take double or extra doses. What may interact with this medicine? Do not take this medicine with any of the following medications: -linezolid -MAOIs like Azilect, Carbex, Eldepryl, Marplan, Nardil, and Parnate -methylene blue (injected into a vein) -other medicines that contain bupropion like Zyban This medicine may also interact with the following medications: -alcohol -certain medicines for anxiety or sleep -certain medicines for blood pressure like metoprolol, propranolol -certain medicines for depression or psychotic disturbances -certain medicines for HIV or AIDS like efavirenz, lopinavir, nelfinavir, ritonavir -certain medicines for irregular heart beat like propafenone, flecainide -certain medicines for Parkinson's disease like amantadine, levodopa -certain medicines for seizures like carbamazepine, phenytoin, phenobarbital -cimetidine -clopidogrel -cyclophosphamide -digoxin -furazolidone -isoniazid -nicotine -orphenadrine -procarbazine -steroid medicines like prednisone or cortisone -stimulant medicines for attention disorders, weight loss, or to stay awake -tamoxifen -theophylline -thiotepa -ticlopidine -tramadol -warfarin This list may not describe all possible interactions. Give your health care provider a list of all the medicines, herbs, non-prescription drugs, or dietary supplements you use. Also tell them if you smoke, drink alcohol, or use illegal drugs. Some items may interact with your medicine. What should I watch for while using this medicine? Tell your doctor if your symptoms do not get better or if they get worse. Visit your doctor or health care professional for regular checks on your progress. Because it may take several weeks to see the full effects of this medicine, it is important to continue your treatment as prescribed by    your doctor. Patients and their families should watch out for new or worsening thoughts of suicide or depression. Also watch out for sudden changes in feelings such as feeling anxious, agitated, panicky, irritable, hostile, aggressive, impulsive, severely restless, overly excited and hyperactive, or not being able to sleep. If this happens, especially at the beginning of treatment or after a change in dose, call your health care professional. Avoid alcoholic drinks while taking this medicine. Drinking excessive alcoholic beverages, using sleeping or anxiety medicines, or quickly stopping the use of these agents while taking this medicine may increase your risk for a seizure. Do not drive or use heavy machinery until you know how this medicine affects you. This medicine can impair your ability to perform these tasks. Do not take this medicine close to bedtime. It may prevent you from sleeping. Your mouth may get dry. Chewing sugarless gum or sucking hard candy, and drinking plenty of water may help. Contact your doctor if the problem does not go away or is severe. What side effects may I notice from receiving this medicine? Side effects that you should report to your doctor or health care professional as soon as possible: -allergic reactions like skin rash, itching or hives, swelling of the face, lips, or tongue -breathing problems -changes in vision -confusion -elevated mood, decreased need for sleep, racing thoughts, impulsive behavior -fast or irregular heartbeat -hallucinations, loss of contact with reality -increased blood pressure -redness, blistering, peeling or loosening of the skin, including inside the mouth -seizures -suicidal thoughts or other mood changes -unusually weak or tired -vomiting Side effects that usually do not require medical attention (report to your doctor or health care professional if they continue or are bothersome): -constipation -headache -loss of  appetite -nausea -tremors -weight loss This list may not describe all possible side effects. Call your doctor for medical advice about side effects. You may report side effects to FDA at 1-800-FDA-1088. Where should I keep my medicine? Keep out of the reach of children. Store at room temperature between 20 and 25 degrees C (68 and 77 degrees F), away from direct sunlight and moisture. Keep tightly closed. Throw away any unused medicine after the expiration date. NOTE: This sheet is a summary. It may not cover all possible information. If you have questions about this medicine, talk to your doctor, pharmacist, or health care provider.  2018 Elsevier/Gold Standard (2016-02-15 13:44:21)  
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
## 2017-11-24 ENCOUNTER — Encounter: Payer: Self-pay | Admitting: Internal Medicine

## 2017-11-25 ENCOUNTER — Encounter: Payer: Medicare Other | Admitting: Nurse Practitioner

## 2017-11-25 DIAGNOSIS — I87331 Chronic venous hypertension (idiopathic) with ulcer and inflammation of right lower extremity: Secondary | ICD-10-CM | POA: Diagnosis not present

## 2017-11-25 DIAGNOSIS — E1140 Type 2 diabetes mellitus with diabetic neuropathy, unspecified: Secondary | ICD-10-CM | POA: Diagnosis not present

## 2017-11-25 DIAGNOSIS — E11621 Type 2 diabetes mellitus with foot ulcer: Secondary | ICD-10-CM | POA: Diagnosis not present

## 2017-11-25 DIAGNOSIS — E11622 Type 2 diabetes mellitus with other skin ulcer: Secondary | ICD-10-CM | POA: Diagnosis not present

## 2017-11-25 DIAGNOSIS — L97222 Non-pressure chronic ulcer of left calf with fat layer exposed: Secondary | ICD-10-CM | POA: Diagnosis not present

## 2017-11-25 DIAGNOSIS — L97223 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e: Secondary | ICD-10-CM | POA: Diagnosis not present

## 2017-11-25 DIAGNOSIS — L97212 Non-pressure chronic ulcer of right calf with fat layer exposed: Secondary | ICD-10-CM | POA: Diagnosis not present

## 2017-11-25 DIAGNOSIS — L97218 Non-pressure chronic ulcer of right calf with other specified severity: Secondary | ICD-10-CM | POA: Diagnosis not present

## 2017-11-25 DIAGNOSIS — I10 Essential (primary) hypertension: Secondary | ICD-10-CM | POA: Diagnosis not present

## 2017-11-29 NOTE — Progress Notes (Signed)
[name], [name] ([id number]) Visit Report for 11/25/2017 Chief Complaint Document Details Patient Name: [name], [name]. Date of Service: 11/25/2017 2:00 PM Medical Record Number: [id number] Patient Account Number: [id number] Date of Birth/Sex: [birth year] (72 y.o. F) Treating RN: Cornell Barman Primary Care Provider: Viviana Simpler Other Clinician: Referring Provider: Viviana Simpler Treating Provider/Extender: Cathie Olden in Treatment: 8 Information Obtained from: Patient Chief Complaint She is here for follow up evaluation of BLE ulcerations Electronic Signature(s) Signed: 11/25/2017 3:31:04 PM By: Lawanda Cousins Entered By: Lawanda Cousins on 11/25/2017 15:31:03 [name] ([id number]) -------------------------------------------------------------------------------- Debridement Details Patient Name: [name]. Date of Service: 11/25/2017 2:00 PM Medical Record Number: [id number] Patient Account Number: [id number] Date of Birth/Sex: 24-Apr-1946 (72 y.o. F) Treating RN: Roger Shelter Primary Care Provider: Viviana Simpler Other Clinician: Referring Provider: Viviana Simpler Treating Provider/Extender: Cathie Olden in Treatment: 8 Debridement Performed for Wound #2 Left,Lateral Lower Leg Assessment: Performed By: Physician Lawanda Cousins, NP Debridement Type: Debridement Severity of Tissue Pre Fat layer exposed Debridement: Pre-procedure Verification/Time Yes - 14:53 Out Taken: Start Time: 14:53 Pain Control: Other : lidocaine 4% Level: Skin/Subcutaneous Tissue Total Area Debrided (L x W): 5 (cm) x 1 (cm) = 5 (cm) Tissue and other material Viable, Non-Viable, Fibrin/Slough, Skin, Subcutaneous debrided: Instrument: Curette Bleeding: Minimum Hemostasis Achieved: Pressure End Time: 14:54 Procedural Pain: 0 Post Procedural Pain: 0 Response to Treatment: Procedure was tolerated well Post Debridement Measurements of Total Wound Length: (cm)  5 Width: (cm) 1 Depth: (cm) 0.3 Volume: (cm) 1.178 Character of Wound/Ulcer Post Debridement: Stable Severity of Tissue Post Debridement: Fat layer exposed Post Procedure Diagnosis Same as Pre-procedure Electronic Signature(s) Signed: 11/25/2017 4:45:38 PM By: Roger Shelter Signed: 11/25/2017 5:31:19 PM By: Lawanda Cousins Entered By: Roger Shelter on 11/25/2017 14:55:02 [name], [name] ([id number]) -------------------------------------------------------------------------------- Debridement Details Patient Name: [name]. Date of Service: 11/25/2017 2:00 PM Medical Record Number: [id number] Patient Account Number: [id number] Date of Birth/Sex: 1946/04/06 (72 y.o. F) Treating RN: Roger Shelter Primary Care Provider: Viviana Simpler Other Clinician: Referring Provider: Viviana Simpler Treating Provider/Extender: Cathie Olden in Treatment: 8 Debridement Performed for Wound #1 Right,Medial Lower Leg Assessment: Performed By: Physician Lawanda Cousins, NP Debridement Type: Debridement Severity of Tissue Pre Fat layer exposed Debridement: Pre-procedure Verification/Time Yes - 14:53 Out Taken: Start Time: 14:53 Pain Control: Other : lidocaine 4% Level: Skin/Subcutaneous Tissue Total Area Debrided (L x W): 1 (cm) x 1 (cm) = 1 (cm) Tissue and other material Viable, Non-Viable, Fibrin/Slough, Skin, Subcutaneous debrided: Instrument: Curette Bleeding: Minimum Hemostasis Achieved: Pressure End Time: 14:54 Procedural Pain: 0 Post Procedural Pain: 0 Response to Treatment: Procedure was tolerated well Post Debridement Measurements of Total Wound Length: (cm) 1 Width: (cm) 1 Depth: (cm) 0.3 Volume: (cm) 0.236 Character of Wound/Ulcer Post Debridement: Stable Severity of Tissue Post Debridement: Fat layer exposed Post Procedure Diagnosis Same as Pre-procedure Electronic Signature(s) Signed: 11/25/2017 4:45:38 PM By: Roger Shelter Signed: 11/25/2017  5:31:19 PM By: Lawanda Cousins Entered By: Roger Shelter on 11/25/2017 14:55:44 [name], [name] ([id number]) -------------------------------------------------------------------------------- HPI Details Patient Name: [name]. Date of Service: 11/25/2017 2:00 PM Medical Record Number: [id number] Patient Account Number: [id number] Date of Birth/Sex: 01-28-1946 (72 y.o. F) Treating RN: Cornell Barman Primary Care Provider: Viviana Simpler Other Clinician: Referring Provider: Viviana Simpler Treating Provider/Extender: Cathie Olden in Treatment: 8 History of Present Illness HPI Description: 09/30/17; patient is a 72 year old woman with type 2 diabetes and peripheral neuropathy. On 08/30/17 she fell down some steps. She developed redness  and swelling and was seen in her primary physician's office on 09/11/17. She was felt to have cellulitis and given Keflex. She will return for follow-up on 09/18/17 noted to have an 8 x 4 cm contusion on the left lateral lower leg and a 3 x 3 cm contusion on the right medial lower leg. At follow-up on 09/29/16 she had a 4 x 4 centimeter necrotic surface and she's been referred here. The patient is not on blood thinners she does not have a history of falling. She states her diabetes is under good control ABIs in this clinic could not be obtained because of pain. Patient does not have a prior wound history 10/07/17; the patient came back to the clinic last week for a nurse rewrap. Noted that the area on the right medial leg had opened as well. She is using silver alginate under compression. The substantial wound on the left anterior leg is malodorous today. I did a culture of the wound surface but no empiric antibiotics. The area on the right lateral leg is open with a small draining open area with gelatinous old blood. Using pickups and scalpel I remove necrotic surface from around this area over the entire area, pulse fairly substantially inferiorly. We've  been using silver alginate to both wound areas 10/14/17; patient still has substantial drainage from these areas although they actually look some better. The area on the right lateral leg is opened up and there is depth of this. The area on the left leg is larger but more superficial. This area hasn't bridged area of skin that I thought I would have to removed however the granulation as come up and I'm wondering whether we can simply watch this for now these both were originally hematomas from trauma. CULTURE I did last week showed Escherichia coli and Pseudomonas both sensitive to ciprofloxacin. This was called in for her to replace the doxycycline I prescribed empirically 10/21/17; patient is making reasonably good progress in both wound areas include using silver alginate. Paradoxically the larger original wound area actually looks better. There is no evidence of infection. 10/28/17; patient continues to make excellent progress in both wound areas using silver alginate. I have been pleasantly surprised by the progress. I note ABIs of 1.25 on the right and 1.24 on the left. 11/04/17. Both the patient's wound area on the medial right calf and on the left anterior calf continue to make rapid progress. We have been using silver alginate 11/11/17; both of the patient's wounds continue to make nice progress using silver alginate. 11/18/17; the patient is doing nicely using silver alginate under compression. She is leaving this on a week 11/25/17-she is here in follow-up evaluation for bilateral lower extremity ulcerations. There is significant improvement to all areas, there is hyper granulation noted to the right lower extremity which was debrided. We will continue with same treatment plan and she will follow-up next week Electronic Signature(s) Signed: 11/25/2017 3:31:35 PM By: Lawanda Cousins Entered By: Lawanda Cousins on 11/25/2017 15:31:35 [name]  ([id number]) -------------------------------------------------------------------------------- Physician Orders Details Patient Name: [name]. Date of Service: 11/25/2017 2:00 PM Medical Record Number: [id number] Patient Account Number: [id number] Date of Birth/Sex: 11-05-1945 (72 y.o. F) Treating RN: Roger Shelter Primary Care Provider: Viviana Simpler Other Clinician: Referring Provider: Viviana Simpler Treating Provider/Extender: Cathie Olden in Treatment: 8 Verbal / Phone Orders: No Diagnosis Coding Wound Cleansing Wound #1 Right,Medial Lower Leg o Clean wound with Normal Saline. o Cleanse wound with mild soap and  water Wound #2 Left,Lateral Lower Leg o Clean wound with Normal Saline. o Cleanse wound with mild soap and water Anesthetic (add to Medication List) Wound #1 Right,Medial Lower Leg o Topical Lidocaine 4% cream applied to wound bed prior to debridement (In Clinic Only). o Hurricaine Topical Anesthetic Spray applied to wound bed prior to debridement (In Clinic Only). Wound #2 Left,Lateral Lower Leg o Topical Lidocaine 4% cream applied to wound bed prior to debridement (In Clinic Only). o Hurricaine Topical Anesthetic Spray applied to wound bed prior to debridement (In Clinic Only). Primary Wound Dressing Wound #1 Right,Medial Lower Leg o Silvercel Non-Adherent Wound #2 Left,Lateral Lower Leg o Silvercel Non-Adherent Secondary Dressing Wound #1 Right,Medial Lower Leg o ABD pad Wound #2 Left,Lateral Lower Leg o ABD pad o Other - charcoal Dressing Change Frequency Wound #1 Right,Medial Lower Leg o Other: - twice a week Wound #2 Left,Lateral Lower Leg o Other: - twice a week Follow-up Appointments Wound #1 Right,Medial Lower Leg o Return Appointment in 1 week. [name], [name] ([id number]) o Nurse Visit as needed Wound #2 Left,Lateral Lower Leg o Return Appointment in 1 week. o Nurse Visit as needed Edema  Control Wound #1 Right,Medial Lower Leg o Kerlix and Coban - Bilateral - unna to anchor o Elevate legs to the level of the heart and pump ankles as often as possible Wound #2 Left,Lateral Lower Leg o Kerlix and Coban - Bilateral - unna to anchor o Elevate legs to the level of the heart and pump ankles as often as possible Additional Orders / Instructions Wound #1 Right,Medial Lower Leg o Increase protein intake. o Other: - Vitamin C, Zinc Wound #2 Left,Lateral Lower Leg o Increase protein intake. o Other: - Vitamin C, Zinc Electronic Signature(s) Signed: 11/25/2017 4:45:38 PM By: Roger Shelter Signed: 11/25/2017 5:31:19 PM By: Lawanda Cousins Entered By: Roger Shelter on 11/25/2017 14:56:45 [name], [name] ([id number]) -------------------------------------------------------------------------------- Problem List Details Patient Name: [name]. Date of Service: 11/25/2017 2:00 PM Medical Record Number: [id number] Patient Account Number: [id number] Date of Birth/Sex: 01-25-1946 (72 y.o. 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 09/30/2017 Yes L97.218 Non-pressure chronic ulcer of right calf with other specified 09/30/2017 Yes severity E11.40 Type 2 diabetes mellitus with diabetic neuropathy, 09/30/2017 Yes unspecified I87.331 Chronic venous hypertension (idiopathic) with ulcer and 10/07/2017 Yes inflammation of right lower extremity Inactive Problems Resolved Problems Electronic Signature(s) Signed: 11/25/2017 3:30:34 PM By: Lawanda Cousins Entered By: Lawanda Cousins on 11/25/2017 15:30:34 [name], [name]  ([id number]) -------------------------------------------------------------------------------- Progress Note Details Patient Name: [name]. Date of Service: 11/25/2017 2:00 PM Medical Record Number: [id number] Patient Account Number: [id number] Date of Birth/Sex: 26-Jun-1946 (72 y.o. F) Treating RN: Cornell Barman Primary Care Provider: Viviana Simpler Other Clinician: Referring Provider: Viviana Simpler Treating Provider/Extender: Cathie Olden in Treatment: 8 Subjective Chief Complaint Information obtained from Patient She is here for follow up evaluation of BLE ulcerations History of Present Illness (HPI) 09/30/17; patient is a 72 year old woman with type 2 diabetes and peripheral neuropathy. On 08/30/17 she fell down some steps. She developed redness and swelling and was seen in her primary physician's office on 09/11/17. She was felt to have cellulitis and given Keflex. She will return for follow-up on 09/18/17 noted to have an 8 x 4  cm contusion on the left lateral lower leg and a 3 x 3 cm contusion on the right medial lower leg. At follow-up on 09/29/16 she had a 4 x 4 centimeter necrotic surface and she's been referred here. The patient is not on blood thinners she does not have a history of falling. She states her diabetes is under good control ABIs in this clinic could not be obtained because of pain. Patient does not have a prior wound history 10/07/17; the patient came back to the clinic last week for a nurse rewrap. Noted that the area on the right medial leg had opened as well. She is using silver alginate under compression. The substantial wound on the left anterior leg is malodorous today. I did a culture of the wound surface but no empiric antibiotics. The area on the right lateral leg is open with a small draining open area with gelatinous old blood. Using pickups and scalpel I remove necrotic surface from around this area over the entire area, pulse fairly substantially  inferiorly. We've been using silver alginate to both wound areas 10/14/17; patient still has substantial drainage from these areas although they actually look some better. The area on the right lateral leg is opened up and there is depth of this. The area on the left leg is larger but more superficial. This area hasn't bridged area of skin that I thought I would have to removed however the granulation as come up and I'm wondering whether we can simply watch this for now these both were originally hematomas from trauma. CULTURE I did last week showed Escherichia coli and Pseudomonas both sensitive to ciprofloxacin. This was called in for her to replace the doxycycline I prescribed empirically 10/21/17; patient is making reasonably good progress in both wound areas include using silver alginate. Paradoxically the larger original wound area actually looks better. There is no evidence of infection. 10/28/17; patient continues to make excellent progress in both wound areas using silver alginate. I have been pleasantly surprised by the progress. I note ABIs of 1.25 on the right and 1.24 on the left. 11/04/17. Both the patient's wound area on the medial right calf and on the left anterior calf continue to make rapid progress. We have been using silver alginate 11/11/17; both of the patient's wounds continue to make nice progress using silver alginate. 11/18/17; the patient is doing nicely using silver alginate under compression. She is leaving this on a week 11/25/17-she is here in follow-up evaluation for bilateral lower extremity ulcerations. There is significant improvement to all areas, there is hyper granulation noted to the right lower extremity which was debrided. We will continue with same treatment plan and she will follow-up next week Patient History Information obtained from Patient. Family History Cancer - Mother, Heart Disease - Father, No family history of Diabetes, Hereditary Spherocytosis,  Hypertension, Kidney Disease, Lung Disease, Seizures, Stroke, [name], [name]. ([id number]) Thyroid Problems, Tuberculosis. Social History Never smoker, Marital Status - Divorced, Alcohol Use - Rarely, Drug Use - No History, Caffeine Use - Daily. Objective Constitutional Vitals Time Taken: 2:25 PM, Height: 61 in, Weight: 179.1 lbs, BMI: 33.8, Temperature: 98.4 F, Pulse: 77 bpm, Respiratory Rate: 16 breaths/min, Blood Pressure: 141/85 mmHg. Integumentary (Hair, Skin) Wound #1 status is Open. Original cause of wound was Trauma. The wound is located on the Right,Medial Lower Leg. The wound measures 1cm length x 1cm width x 0.3cm depth; 0.785cm^2 area and 0.236cm^3 volume. There is no tunneling or undermining noted. There is a  large amount of serosanguineous drainage noted. The wound margin is distinct with the outline attached to the wound base. There is medium (34-66%) red granulation within the wound bed. There is a medium (34-66%) amount of necrotic tissue within the wound bed including Adherent Slough. The periwound skin appearance exhibited: Dry/Scaly. The periwound skin appearance did not exhibit: Callus, Crepitus, Excoriation, Induration, Rash, Scarring, Maceration, Atrophie Blanche, Cyanosis, Ecchymosis, Hemosiderin Staining, Mottled, Pallor, Rubor, Erythema. Periwound temperature was noted as No Abnormality. The periwound has tenderness on palpation. Wound #2 status is Open. Original cause of wound was Trauma. The wound is located on the Left,Lateral Lower Leg. The wound measures 5cm length x 1cm width x 0.3cm depth; 3.927cm^2 area and 1.178cm^3 volume. There is no tunneling or undermining noted. There is a large amount of serosanguineous drainage noted. The wound margin is distinct with the outline attached to the wound base. There is large (67-100%) red granulation within the wound bed. There is a small (1-33%) amount of necrotic tissue within the wound bed including Adherent Slough.  The periwound skin appearance exhibited: Dry/Scaly. Periwound temperature was noted as No Abnormality. The periwound has tenderness on palpation. 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 ([id number]) Wound #1 Pre-procedure diagnosis of Wound #1 is a Diabetic Wound/Ulcer of the Lower Extremity located on the Right,Medial Lower Leg .Severity of Tissue Pre Debridement is: Fat layer exposed. There was a Skin/Subcutaneous Tissue Debridement (11042- 11047) debridement with total area of 1 sq cm performed by Lawanda Cousins, NP. with the following instrument(s): Curette to remove Viable and Non-Viable tissue/material including Fibrin/Slough, Skin, and Subcutaneous after achieving pain control using Other (lidocaine 4%). A time out was conducted at 14:53, prior to the start of the procedure. A Minimum amount of bleeding was controlled with Pressure. The procedure was tolerated well with a pain level of 0 throughout and a pain level of 0 following the procedure. Post Debridement Measurements: 1cm length x 1cm width x 0.3cm depth; 0.236cm^3 volume. Character of Wound/Ulcer Post Debridement is stable. Severity of Tissue Post Debridement is: Fat layer exposed. Post procedure Diagnosis Wound #1: Same as Pre-Procedure Wound #2 Pre-procedure diagnosis of Wound #2 is a Diabetic Wound/Ulcer of the Lower Extremity located on the Left,Lateral Lower Leg .Severity of Tissue Pre Debridement is: Fat layer exposed. There was a Skin/Subcutaneous Tissue Debridement (11042- 11047) debridement with total area of 5 sq cm performed by Lawanda Cousins, NP. with the following  instrument(s): Curette to remove Viable and Non-Viable tissue/material including Fibrin/Slough, Skin, and Subcutaneous after achieving pain control using Other (lidocaine 4%). A time out was conducted at 14:53, prior to the start of the procedure. A Minimum amount of bleeding was controlled with Pressure. The procedure was tolerated well with a pain level of 0 throughout and a pain level of 0 following the procedure. Post Debridement Measurements: 5cm length x 1cm width x 0.3cm depth; 1.178cm^3 volume. Character of Wound/Ulcer Post Debridement is stable. Severity of Tissue Post Debridement is: Fat layer exposed. Post procedure Diagnosis Wound #2: Same as Pre-Procedure Plan Wound Cleansing: Wound #1 Right,Medial Lower Leg: Clean wound with Normal Saline. Cleanse wound with mild soap and water Wound #2 Left,Lateral Lower Leg: Clean wound with Normal Saline. Cleanse  wound with mild soap and water Anesthetic (add to Medication List): Wound #1 Right,Medial Lower Leg: Topical Lidocaine 4% cream applied to wound bed prior to debridement (In Clinic Only). Hurricaine Topical Anesthetic Spray applied to wound bed prior to debridement (In Clinic Only). Wound #2 Left,Lateral Lower Leg: Topical Lidocaine 4% cream applied to wound bed prior to debridement (In Clinic Only). Hurricaine Topical Anesthetic Spray applied to wound bed prior to debridement (In Clinic Only). Primary Wound Dressing: Wound #1 Right,Medial Lower Leg: Silvercel Non-Adherent Wound #2 Left,Lateral Lower Leg: Silvercel Non-Adherent Secondary Dressing: Wound #1 Right,Medial Lower Leg: ABD pad Wound #2 Left,Lateral Lower Leg: ABD pad Other - charcoal Dressing Change Frequency: Wound #1 Right,Medial Lower Leg: Other: - twice a week [name], [name] ([id number]) Wound #2 Left,Lateral Lower Leg: Other: - twice a week Follow-up Appointments: Wound #1 Right,Medial Lower Leg: Return Appointment in 1 week. Nurse Visit as  needed Wound #2 Left,Lateral Lower Leg: Return Appointment in 1 week. Nurse Visit as needed Edema Control: Wound #1 Right,Medial Lower Leg: Kerlix and Coban - Bilateral - unna to anchor Elevate legs to the level of the heart and pump ankles as often as possible Wound #2 Left,Lateral Lower Leg: Kerlix and Coban - Bilateral - unna to anchor Elevate legs to the level of the heart and pump ankles as often as possible Additional Orders / Instructions: Wound #1 Right,Medial Lower Leg: Increase protein intake. Other: - Vitamin C, Zinc Wound #2 Left,Lateral Lower Leg: Increase protein intake. Other: - Vitamin C, Zinc 1. silvercel, Kerlix, Coban bilaterally 2. Follow-up next week Electronic Signature(s) Signed: 11/25/2017 3:32:27 PM By: Lawanda Cousins Entered By: Lawanda Cousins on 11/25/2017 15:32:27 [name] ([id number]) -------------------------------------------------------------------------------- ROS/PFSH Details Patient Name: [name]. Date of Service: 11/25/2017 2:00 PM Medical Record Number: [id number] Patient Account Number: [id number] Date of Birth/Sex: 1946-01-13 (72 y.o. F) Treating RN: Cornell Barman Primary Care Provider: Viviana Simpler Other Clinician: Referring Provider: Viviana Simpler Treating Provider/Extender: Cathie Olden in Treatment: 8 Information Obtained From Patient Wound History Do you currently have one or more open woundso Yes How many open wounds do you currently haveo 2 Approximately how long have you had your woundso 08/30/17 How have you been treating your wound(s) until nowo bandaging Has your wound(s) ever healed and then re-openedo No Have you had any lab work done in the past montho No Have you tested positive for an antibiotic resistant organism (MRSA, VRE)o No Have you tested positive for osteomyelitis (bone infection)o No Have you had any tests for circulation on your legso No Have you had other problems associated with your  woundso Swelling Cardiovascular Medical History: Positive for: Hypertension Endocrine Medical History: Positive for: Type II Diabetes Time with diabetes: unsure Treated with: Insulin, Oral agents Blood sugar tested every day: No Musculoskeletal Medical History: Positive for: Osteoarthritis Neurologic Medical History: Positive for: Neuropathy Immunizations Pneumococcal Vaccine: Received Pneumococcal Vaccination: No Implantable Devices Family and Social History Cancer: Yes - Mother; Diabetes: No; Heart Disease: Yes - Father; Hereditary Spherocytosis: No; Hypertension: No; Kidney Disease: No; Lung Disease: No; Seizures: No; Stroke: No; Thyroid Problems: No; Tuberculosis: No; Never smoker; Marital Status - Divorced; Alcohol Use: Rarely; Drug Use: No History; Caffeine Use: Daily; Financial Concerns: No; Food, Clothing or LILLIANE, SPOSITO. ([id number]) Shelter Needs: No; Support System Lacking: No; Transportation Concerns: No; Advanced Directives: No; Patient does not want information on Advanced Directives; Do not resuscitate: No; Living Will: Yes (Not Provided); Medical Power of Attorney: Yes - son (Not Provided)  Physician Affirmation I have reviewed and agree with the above information. Electronic Signature(s) Signed: 11/25/2017 5:31:19 PM By: Lawanda Cousins Signed: 11/27/2017 4:43:23 PM By: Gretta Cool, BSN, RN, CWS, Kim RN, BSN Entered By: Lawanda Cousins on 11/25/2017 15:31:45 [name] ([id number]) -------------------------------------------------------------------------------- [place] Details Patient Name: [name]. Date of Service: 11/25/2017 Medical Record Number: [id number] Patient Account Number: [id number] Date of Birth/Sex: 1945-09-12 (72 y.o. 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 E11.40 Type 2 diabetes mellitus with diabetic neuropathy, unspecified I87.331 Chronic venous hypertension (idiopathic) with ulcer and inflammation of right lower extremity Facility Procedures CPT4 Code: 38177116 Description: 57903 - DEB SUBQ TISSUE 20 SQ CM/< ICD-10 Diagnosis Description 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 Quantity: 1 Physician Procedures CPT4 Code: 8333832 Description: 91916 - WC PHYS SUBQ TISS 20 SQ CM ICD-10 Diagnosis Description 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

## 2017-11-29 NOTE — Progress Notes (Signed)
Alexis, Velez ([id number]) Visit Report for 11/25/2017 Arrival Information Details Patient Name: [name], [name]. Date of Service: 11/25/2017 2:00 PM Medical Record Number: [id number] Patient Account Number: [id number] Date of Birth/Sex: 07/21/1946 (71 y.o. F) Treating RN: Cornell Barman Primary Care Kijana Estock: Viviana Simpler Other Clinician: Referring Neveyah Garzon: Viviana Simpler Treating Sherman Lipuma/Extender: Cathie Olden in Treatment: 8 Visit Information History Since Last Visit Added or deleted any medications: Yes Patient Arrived: Ambulatory Any new allergies or adverse reactions: No Arrival Time: 14:19 Had a fall or experienced change in No Accompanied By: self activities of daily living that may affect Transfer Assistance: None risk of falls: Patient Identification Verified: Yes Signs or symptoms of abuse/neglect since last visito No Secondary Verification Process Completed: Yes Hospitalized since last visit: No Patient Requires Transmission-Based No Has Dressing in Place as Prescribed: Yes Precautions: Pain Present Now: No Patient Has Alerts: Yes Patient Alerts: DM II Electronic Signature(s) Signed: 11/26/2017 10:13:52 AM By: Lorine Bears RCP, RRT, CHT Entered By: Lorine Bears on 11/25/2017 14:21:07 [name] ([id number]) -------------------------------------------------------------------------------- Encounter Discharge Information Details Patient Name: [name]. Date of Service: 11/25/2017 2:00 PM Medical Record Number: [id number] Patient Account Number: [id number] Date of Birth/Sex: [birth year] (71 y.o. F) Treating RN: Roger Shelter Primary Care Zakyia Gagan: Viviana Simpler Other Clinician: Referring Remmington Urieta: Viviana Simpler Treating Gwenn Teodoro/Extender: Cathie Olden in Treatment: 8 Encounter Discharge Information Items Discharge Pain Level: 0 Discharge Condition: Stable Ambulatory Status: Ambulatory Discharge  Destination: Home Private Transportation: Auto Accompanied By: self Schedule Follow-up Appointment: Yes Medication Reconciliation completed and provided No to Patient/Care Macenzie Burford: Clinical Summary of Care: Electronic Signature(s) Signed: 11/25/2017 4:45:38 PM By: Roger Shelter Entered By: Roger Shelter on 11/25/2017 15:14:38 Lacuesta, [name] ([id number]) -------------------------------------------------------------------------------- Lower Extremity Assessment Details Patient Name: [name]. Date of Service: 11/25/2017 2:00 PM Medical Record Number: [id number] Patient Account Number: [id number] Date of Birth/Sex: 06-03-1946 (71 y.o. F) Treating RN: Roger Shelter Primary Care Blas Riches: Viviana Simpler Other Clinician: Referring Siara Gorder: Viviana Simpler Treating Amen Staszak/Extender: Cathie Olden in Treatment: 8 Edema Assessment Assessed: [Left: No] [Right: No] Edema: [Left: Yes] [Right: Yes] Calf Left: Right: Point of Measurement: 34 cm From Medial Instep 34 cm 35 cm Ankle Left: Right: Point of Measurement: 9 cm From Medial Instep 24 cm 22.5 cm Vascular Assessment Claudication: Claudication Assessment [Left:None] [Right:None] Pulses: Dorsalis Pedis Palpable: [Left:Yes] [Right:Yes] Posterior Tibial Extremity colors, hair growth, and conditions: Extremity Color: [Left:Normal] [Right:Normal] Hair Growth on Extremity: [Left:Yes] [Right:Yes] Temperature of Extremity: [Left:Warm] [Right:Warm] Capillary Refill: [Left:< 3 seconds] [Right:< 3 seconds] Toe Nail Assessment Left: Right: Thick: No No Discolored: No No Deformed: No No Improper Length and Hygiene: No No Electronic Signature(s) Signed: 11/25/2017 4:45:38 PM By: Roger Shelter Entered By: Roger Shelter on 11/25/2017 14:47:38 Barrie, [name] ([id number]) -------------------------------------------------------------------------------- Multi Wound Chart Details Patient Name: [name]. Date of Service: 11/25/2017 2:00 PM Medical Record Number: [id number] Patient Account Number: [id number] Date of Birth/Sex: 07-Nov-1945 (71 y.o. F) Treating RN: Roger Shelter Primary Care Ayushi Pla: Viviana Simpler Other Clinician: Referring Ceara Wrightson: Viviana Simpler Treating Rusell Meneely/Extender: Cathie Olden in Treatment: 8 Vital Signs Height(in): 61 Pulse(bpm): 42 Weight(lbs): 179.1 Blood Pressure(mmHg): 141/85 Body Mass Index(BMI): 34 Temperature(F): 98.4 Respiratory Rate 16 (breaths/min): Photos: [1:No Photos] [2:No Photos] [N/A:N/A] Wound Location: [1:Right Lower Leg - Medial] [2:Left Lower Leg - Lateral] [N/A:N/A] Wounding Event: [1:Trauma] [2:Trauma] [N/A:N/A] Primary Etiology: [1:Diabetic Wound/Ulcer of the Lower Extremity] [2:Diabetic Wound/Ulcer of the Lower Extremity] [N/A:N/A] Secondary Etiology: [1:Trauma, Other] [2:Trauma, Other] [N/A:N/A] Comorbid History: [  1:Hypertension, Type II Diabetes, Osteoarthritis, Neuropathy] [2:Hypertension, Type II Diabetes, Osteoarthritis, Neuropathy] [N/A:N/A] Date Acquired: [1:08/30/2017] [2:08/30/2017] [N/A:N/A] Weeks of Treatment: [1:8] [2:8] [N/A:N/A] Wound Status: [1:Open] [2:Open] [N/A:N/A] Measurements L x W x D [1:1x1x0.3] [2:5x1x0.3] [N/A:N/A] (cm) Area (cm) : [1:0.785] [2:3.927] [N/A:N/A] Volume (cm) : [1:0.236] [2:1.178] [N/A:N/A] % Reduction in Area: [1:90.50%] [2:80.20%] [N/A:N/A] % Reduction in Volume: [1:71.40%] [2:91.50%] [N/A:N/A] Classification: [1:Grade 2] [2:Grade 2] [N/A:N/A] Exudate Amount: [1:Large] [2:Large] [N/A:N/A] Exudate Type: [1:Serosanguineous] [2:Serosanguineous] [N/A:N/A] Exudate Color: [1:red, brown] [2:red, brown] [N/A:N/A] Wound Margin: [1:Distinct, outline attached] [2:Distinct, outline attached] [N/A:N/A] Granulation Amount: [1:Medium (34-66%)] [2:Large (67-100%)] [N/A:N/A] Granulation Quality: [1:Red] [2:Red] [N/A:N/A] Necrotic Amount: [1:Medium (34-66%)] [2:Small (1-33%)]  [N/A:N/A] Exposed Structures: [1:Fascia: No Fat Layer (Subcutaneous Tissue) Exposed: No Tendon: No Muscle: No Joint: No Bone: No] [2:Fascia: No Fat Layer (Subcutaneous Tissue) Exposed: No Tendon: No Muscle: No Joint: No Bone: No] [N/A:N/A] Epithelialization: [1:None] [2:None] [N/A:N/A] Debridement: [1:Debridement (11042-11047)] [2:Debridement (11042-11047)] [N/A:N/A] Pre-procedure [1:14:53] [2:14:53] [N/A:N/A] Verification/Time Out Taken: RAYLA, PEMBER ([id number]) Pain Control: Other Other N/A Tissue Debrided: Fibrin/Slough, Skin, Fibrin/Slough, Skin, N/A Subcutaneous Subcutaneous Level: Skin/Subcutaneous Tissue Skin/Subcutaneous Tissue N/A Debridement Area (sq cm): 1 5 N/A Instrument: Curette Curette N/A Bleeding: Minimum Minimum N/A Hemostasis Achieved: Pressure Pressure N/A Procedural Pain: 0 0 N/A Post Procedural Pain: 0 0 N/A Debridement Treatment Procedure was tolerated well Procedure was tolerated well N/A Response: Post Debridement 1x1x0.3 5x1x0.3 N/A Measurements L x W x D (cm) Post Debridement Volume: 0.236 1.178 N/A (cm) Periwound Skin Texture: Excoriation: No No Abnormalities Noted N/A Induration: No Callus: No Crepitus: No Rash: No Scarring: No Periwound Skin Moisture: Dry/Scaly: Yes Dry/Scaly: Yes N/A Maceration: No Periwound Skin Color: Atrophie Blanche: No No Abnormalities Noted N/A Cyanosis: No Ecchymosis: No Erythema: No Hemosiderin Staining: No Mottled: No Pallor: No Rubor: No Temperature: No Abnormality No Abnormality N/A Tenderness on Palpation: Yes Yes N/A Wound Preparation: Ulcer Cleansing: Ulcer Cleansing: N/A Rinsed/Irrigated with Saline Rinsed/Irrigated with Saline Topical Anesthetic Applied: Topical Anesthetic Applied: Other: lidocaine 4% Other: lidocaine 4% Procedures Performed: Debridement Debridement N/A Treatment Notes Wound #1 (Right, Medial Lower Leg) 1. Cleansed with: Clean wound with Normal Saline 2.  Anesthetic Topical Lidocaine 4% cream to wound bed prior to debridement 4. Dressing Applied: Other dressing (specify in notes) 5. Secondary Dressing Applied ABD Pad Notes silvercelol with abd, kerlix and coban with tubigrip bilateral Veltre, Laine M. ([id number]) Wound #2 (Left, Lateral Lower Leg) 1. Cleansed with: Clean wound with Normal Saline 2. Anesthetic Topical Lidocaine 4% cream to wound bed prior to debridement 4. Dressing Applied: Other dressing (specify in notes) 5. Secondary Dressing Applied ABD Pad Notes silvercelol with abd, kerlix and coban with tubigrip bilateral Electronic Signature(s) Signed: 11/25/2017 3:30:41 PM By: Lawanda Cousins Entered By: Lawanda Cousins on 11/25/2017 15:30:41 [name] ([id number]) -------------------------------------------------------------------------------- [place] Details Patient Name: [name]. Date of Service: 11/25/2017 2:00 PM Medical Record Number: [id number] Patient Account Number: [id number] Date of Birth/Sex: [birth year] (71 y.o. F) Treating RN: Roger Shelter Primary Care Isabeau Mccalla: Viviana Simpler Other Clinician: Referring Sheranda Seabrooks: Viviana Simpler Treating Deanna Boehlke/Extender: Cathie Olden in Treatment: 8 Active Inactive ` Abuse / Safety / Falls / Self Care Management Nursing Diagnoses: History of Falls Potential for falls Goals: Patient will not experience any injury related to falls Date Initiated: 09/30/2017 Target Resolution Date: 01/16/2018 Goal Status: Active Interventions: Assess Activities of Daily Living upon admission and as needed Assess fall risk on admission and as needed Assess: immobility, friction, shearing, incontinence upon admission and as  needed Assess impairment of mobility on admission and as needed per policy Notes: ` Nutrition Nursing Diagnoses: Imbalanced nutrition Impaired glucose control: actual or potential Potential for alteratiion in  Nutrition/Potential for imbalanced nutrition Goals: Patient/caregiver agrees to and verbalizes understanding of need to use nutritional supplements and/or vitamins as prescribed Date Initiated: 09/30/2017 Target Resolution Date: 01/16/2018 Goal Status: Active Patient/caregiver will maintain therapeutic glucose control Date Initiated: 09/30/2017 Target Resolution Date: 01/16/2018 Goal Status: Active Interventions: Assess patient nutrition upon admission and as needed per policy Provide education on elevated blood sugars and impact on wound healing Notes: CIANNA, KASPARIAN ([id number]) Orientation to the Wound Care Program Nursing Diagnoses: Knowledge deficit related to the wound healing center program Goals: Patient/caregiver will verbalize understanding of the [place] [facility] Date Initiated: 09/30/2017 Target Resolution Date: 10/17/2017 Goal Status: Active Interventions: Provide education on orientation to the wound center Notes: ` Pain, Acute or Chronic Nursing Diagnoses: Pain, acute or chronic: actual or potential Potential alteration in comfort, pain Goals: Patient/caregiver will verbalize adequate pain control between visits Date Initiated: 09/30/2017 Target Resolution Date: 01/16/2018 Goal Status: Active Interventions: Complete pain assessment as per visit requirements Notes: ` Wound/Skin Impairment Nursing Diagnoses: Impaired tissue integrity Knowledge deficit related to ulceration/compromised skin integrity Goals: Ulcer/skin breakdown will have a volume reduction of 80% by week 12 Date Initiated: 09/30/2017 Target Resolution Date: 01/16/2018 Goal Status: Active Interventions: Assess patient/caregiver ability to perform ulcer/skin care regimen upon admission and as needed Assess ulceration(s) every visit Notes: Electronic Signature(s) Signed: 11/25/2017 4:45:38 PM By: Roger Shelter Entered By: Roger Shelter on 11/25/2017 14:47:44 Prichett, [name] ([id number]) Romana Juniper, Jayme M. ([id number]) -------------------------------------------------------------------------------- Pain Assessment Details Patient Name: [name]. Date of Service: 11/25/2017 2:00 PM Medical Record Number: [id number] Patient Account Number: [id number] Date of Birth/Sex: 03-14-1946 (71 y.o. F) Treating RN: Cornell Barman Primary Care Ugochi Henzler: Viviana Simpler Other Clinician: Referring Luay Balding: Viviana Simpler Treating Cameryn Chrisley/Extender: Cathie Olden in Treatment: 8 Active Problems Location of Pain Severity and Description of Pain Patient Has Paino No Site Locations Pain Management and Medication Current Pain Management: Electronic Signature(s) Signed: 11/26/2017 10:13:52 AM By: Lorine Bears RCP, RRT, CHT Signed: 11/27/2017 4:43:23 PM By: Gretta Cool, BSN, RN, CWS, Kim RN, BSN Entered By: Lorine Bears on 11/25/2017 14:21:18 [name] ([id number]) -------------------------------------------------------------------------------- Patient/Caregiver Education Details Patient Name: [name]. Date of Service: 11/25/2017 2:00 PM Medical Record Number: [id number] Patient Account Number: [id number] Date of Birth/Gender: 11-17-1945 (71 y.o. F) Treating RN: Roger Shelter Primary Care Physician: Viviana Simpler Other Clinician: Referring Physician: Viviana Simpler Treating Physician/Extender: Cathie Olden in Treatment: 8 Education Assessment Education Provided To: Patient Education Topics Provided Wound Debridement: Handouts: Wound Debridement Methods: Explain/Verbal Responses: State content correctly Wound/Skin Impairment: Handouts: Caring for Your Ulcer Methods: Explain/Verbal Responses: State content correctly Electronic Signature(s) Signed: 11/25/2017 4:45:38 PM By: Roger Shelter Entered By: Roger Shelter on 11/25/2017 15:14:57 Ellender, [name]  ([id number]) -------------------------------------------------------------------------------- Wound Assessment Details Patient Name: [name]. Date of Service: 11/25/2017 2:00 PM Medical Record Number: [id number] Patient Account Number: [id number] Date of Birth/Sex: September 28, 1945 (71 y.o. F) Treating RN: Roger Shelter Primary Care Kynzie Polgar: Viviana Simpler Other Clinician: Referring Concettina Leth: Viviana Simpler Treating Duriel Deery/Extender: Cathie Olden in Treatment: 8 Wound Status Wound Number: 1 Primary Etiology: Diabetic Wound/Ulcer of the Lower Extremity Wound Location: Right Lower Leg - Medial Secondary Trauma, Other Wounding Event: Trauma Etiology: Date Acquired: 08/30/2017 Wound Status: Open Weeks Of Treatment: 8 Comorbid Hypertension, Type II Diabetes, Clustered Wound: No  History: Osteoarthritis, Neuropathy Photos Photo Uploaded By: Roger Shelter on 11/25/2017 16:53:40 Wound Measurements Length: (cm) 1 Width: (cm) 1 Depth: (cm) 0.3 Area: (cm) 0.785 Volume: (cm) 0.236 % Reduction in Area: 90.5% % Reduction in Volume: 71.4% Epithelialization: None Tunneling: No Undermining: No Wound Description Classification: Grade 2 Wound Margin: Distinct, outline attached Exudate Amount: Large Exudate Type: Serosanguineous Exudate Color: red, brown Foul Odor After Cleansing: No Slough/Fibrino Yes Wound Bed Granulation Amount: Medium (34-66%) Exposed Structure Granulation Quality: Red Fascia Exposed: No Necrotic Amount: Medium (34-66%) Fat Layer (Subcutaneous Tissue) Exposed: No Necrotic Quality: Adherent Slough Tendon Exposed: No Muscle Exposed: No Joint Exposed: No Bone Exposed: No Periwound Skin Texture Halabi, Ivet M. ([id number]) Texture Color No Abnormalities Noted: No No Abnormalities Noted: No Callus: No Atrophie Blanche: No Crepitus: No Cyanosis: No Excoriation: No Ecchymosis: No Induration: No Erythema: No Rash: No Hemosiderin  Staining: No Scarring: No Mottled: No Pallor: No Moisture Rubor: No No Abnormalities Noted: No Dry / Scaly: Yes Temperature / Pain Maceration: No Temperature: No Abnormality Tenderness on Palpation: Yes Wound Preparation Ulcer Cleansing: Rinsed/Irrigated with Saline Topical Anesthetic Applied: Other: lidocaine 4%, Treatment Notes Wound #1 (Right, Medial Lower Leg) 1. Cleansed with: Clean wound with Normal Saline 2. Anesthetic Topical Lidocaine 4% cream to wound bed prior to debridement 4. Dressing Applied: Other dressing (specify in notes) 5. Secondary Dressing Applied ABD Pad Notes silvercelol with abd, kerlix and coban with tubigrip bilateral Electronic Signature(s) Signed: 11/25/2017 4:45:38 PM By: Roger Shelter Entered By: Roger Shelter on 11/25/2017 14:44:10 Golomb, [name] ([id number]) -------------------------------------------------------------------------------- Wound Assessment Details Patient Name: [name]. Date of Service: 11/25/2017 2:00 PM Medical Record Number: [id number] Patient Account Number: [id number] Date of Birth/Sex: 1946-06-21 (71 y.o. F) Treating RN: Roger Shelter Primary Care Carmela Piechowski: Viviana Simpler Other Clinician: Referring Wyatte Dames: Viviana Simpler Treating Jonda Alanis/Extender: Cathie Olden in Treatment: 8 Wound Status Wound Number: 2 Primary Etiology: Diabetic Wound/Ulcer of the Lower Extremity Wound Location: Left Lower Leg - Lateral Secondary Trauma, Other Wounding Event: Trauma Etiology: Date Acquired: 08/30/2017 Wound Status: Open Weeks Of Treatment: 8 Comorbid Hypertension, Type II Diabetes, Clustered Wound: No History: Osteoarthritis, Neuropathy Photos Photo Uploaded By: Roger Shelter on 11/25/2017 16:53:41 Wound Measurements Length: (cm) 5 Width: (cm) 1 Depth: (cm) 0.3 Area: (cm) 3.927 Volume: (cm) 1.178 % Reduction in Area: 80.2% % Reduction in Volume: 91.5% Epithelialization:  None Tunneling: No Undermining: No Wound Description Classification: Grade 2 Wound Margin: Distinct, outline attached Exudate Amount: Large Exudate Type: Serosanguineous Exudate Color: red, brown Foul Odor After Cleansing: No Slough/Fibrino Yes Wound Bed Granulation Amount: Large (67-100%) Exposed Structure Granulation Quality: Red Fascia Exposed: No Necrotic Amount: Small (1-33%) Fat Layer (Subcutaneous Tissue) Exposed: No Necrotic Quality: Adherent Slough Tendon Exposed: No Muscle Exposed: No Joint Exposed: No Bone Exposed: No Periwound Skin Texture Mcilwain, Raeanna M. ([id number]) Texture Color No Abnormalities Noted: No No Abnormalities Noted: No Moisture Temperature / Pain No Abnormalities Noted: No Temperature: No Abnormality Dry / Scaly: Yes Tenderness on Palpation: Yes Wound Preparation Ulcer Cleansing: Rinsed/Irrigated with Saline Topical Anesthetic Applied: Other: lidocaine 4%, Treatment Notes Wound #2 (Left, Lateral Lower Leg) 1. Cleansed with: Clean wound with Normal Saline 2. Anesthetic Topical Lidocaine 4% cream to wound bed prior to debridement 4. Dressing Applied: Other dressing (specify in notes) 5. Secondary Dressing Applied ABD Pad Notes silvercelol with abd, kerlix and coban with tubigrip bilateral Electronic Signature(s) Signed: 11/25/2017 4:45:38 PM By: Roger Shelter Entered By: Roger Shelter on 11/25/2017 14:44:38 Doubrava, Gael M. ([id number]) -------------------------------------------------------------------------------- Vitals  Details Patient Name: NUSRAT, ENCARNACION. Date of Service: 11/25/2017 2:00 PM Medical Record Number: [id number] Patient Account Number: [id number] Date of Birth/Sex: December 27, 1945 (71 y.o. F) Treating RN: Cornell Barman Primary Care Minal Stuller: Viviana Simpler Other Clinician: Referring Katiejo Gilroy: Viviana Simpler Treating Sincerity Cedar/Extender: Cathie Olden in Treatment: 8 Vital Signs Time Taken: 14:25 Temperature  (F): 98.4 Height (in): 61 Pulse (bpm): 77 Weight (lbs): 179.1 Respiratory Rate (breaths/min): 16 Body Mass Index (BMI): 33.8 Blood Pressure (mmHg): 141/85 Reference Range: 80 - 120 mg / dl Electronic Signature(s) Signed: 11/26/2017 10:13:52 AM By: Lorine Bears RCP, RRT, CHT Entered By: Lorine Bears on 11/25/2017 14:25:40

## 2017-12-01 ENCOUNTER — Encounter: Payer: Self-pay | Admitting: Internal Medicine

## 2017-12-02 ENCOUNTER — Encounter: Payer: Medicare Other | Admitting: Internal Medicine

## 2017-12-02 DIAGNOSIS — L97223 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e: Secondary | ICD-10-CM | POA: Diagnosis not present

## 2017-12-02 DIAGNOSIS — I872 Venous insufficiency (chronic) (peripheral): Secondary | ICD-10-CM | POA: Diagnosis not present

## 2017-12-02 DIAGNOSIS — E1140 Type 2 diabetes mellitus with diabetic neuropathy, unspecified: Secondary | ICD-10-CM | POA: Diagnosis not present

## 2017-12-02 DIAGNOSIS — I87331 Chronic venous hypertension (idiopathic) with ulcer and inflammation of right lower extremity: Secondary | ICD-10-CM | POA: Diagnosis not present

## 2017-12-02 DIAGNOSIS — L97212 Non-pressure chronic ulcer of right calf with fat layer exposed: Secondary | ICD-10-CM | POA: Diagnosis not present

## 2017-12-02 DIAGNOSIS — L97218 Non-pressure chronic ulcer of right calf with other specified severity: Secondary | ICD-10-CM | POA: Diagnosis not present

## 2017-12-02 DIAGNOSIS — L97222 Non-pressure chronic ulcer of left calf with fat layer exposed: Secondary | ICD-10-CM | POA: Diagnosis not present

## 2017-12-02 DIAGNOSIS — I890 Lymphedema, not elsewhere classified: Secondary | ICD-10-CM | POA: Diagnosis not present

## 2017-12-02 DIAGNOSIS — E11621 Type 2 diabetes mellitus with foot ulcer: Secondary | ICD-10-CM | POA: Diagnosis not present

## 2017-12-02 DIAGNOSIS — I10 Essential (primary) hypertension: Secondary | ICD-10-CM | POA: Diagnosis not present

## 2017-12-03 ENCOUNTER — Encounter: Payer: Self-pay | Admitting: Internal Medicine

## 2017-12-04 NOTE — Progress Notes (Signed)
SUDA, FORBESS [place] ([id number]) Visit Report for 12/02/2017 HPI Details Patient Name: [name], [name]. Date of Service: 12/02/2017 2:00 PM Medical Record Number: [id number] Patient Account Number: [id number] Date of Birth/Sex: April 16, 1946 (71 y.o. F) Treating RN: Cornell Barman Primary Care Provider: Viviana Simpler Other Clinician: Referring Provider: Viviana Simpler Treating Provider/Extender: Tito Dine in Treatment: 9 History of Present Illness HPI Description: 09/30/17; patient is a 72 year old woman with type 2 diabetes and peripheral neuropathy. On 08/30/17 she fell down some steps. She developed redness and swelling and was seen in her primary physician's office on 09/11/17. She was felt to have cellulitis and given Keflex. She will return for follow-up on 09/18/17 noted to have an 8 x 4 cm contusion on the left lateral lower leg and a 3 x 3 cm contusion on the right medial lower leg. At follow-up on 09/29/16 she had a 4 x 4 centimeter necrotic surface and she's been referred here. The patient is not on blood thinners she does not have a history of falling. She states her diabetes is under good control ABIs in this clinic could not be obtained because of pain. Patient does not have a prior wound history 10/07/17; the patient came back to the clinic last week for a nurse rewrap. Noted that the area on the right medial leg had opened as well. She is using silver alginate under compression. The substantial wound on the left anterior leg is malodorous today. I did a culture of the wound surface but no empiric antibiotics. The area on the right lateral leg is open with a small draining open area with gelatinous old blood. Using pickups and scalpel I remove necrotic surface from around this area over the entire area, pulse fairly substantially inferiorly. We've been using silver alginate to both wound areas 10/14/17; patient still has substantial drainage from these areas although they  actually look some better. The area on the right lateral leg is opened up and there is depth of this. The area on the left leg is larger but more superficial. This area hasn't bridged area of skin that I thought I would have to removed however the granulation as come up and I'm wondering whether we can simply watch this for now these both were originally hematomas from trauma. CULTURE I did last week showed Escherichia coli and Pseudomonas both sensitive to ciprofloxacin. This was called in for her to replace the doxycycline I prescribed empirically 10/21/17; patient is making reasonably good progress in both wound areas include using silver alginate. Paradoxically the larger original wound area actually looks better. There is no evidence of infection. 10/28/17; patient continues to make excellent progress in both wound areas using silver alginate. I have been pleasantly surprised by the progress. I note ABIs of 1.25 on the right and 1.24 on the left. 11/04/17. Both the patient's wound area on the medial right calf and on the left anterior calf continue to make rapid progress. We have been using silver alginate 11/11/17; both of the patient's wounds continue to make nice progress using silver alginate. 11/18/17; the patient is doing nicely using silver alginate under compression. She is leaving this on a week 11/25/17-she is here in follow-up evaluation for bilateral lower extremity ulcerations. There is significant improvement to all areas, there is hyper granulation noted to the right lower extremity which was debrided. We will continue with same treatment plan and she will follow-up next week 12/02/17; bilateral lower extremity ulcerations which were initially traumatic. Large hematomas. She  is generally done very well. We have been using silver alginate. No debridement is necessary Electronic Signature(s) Signed: 12/03/2017 8:15:41 AM By: Linton Ham MD Entered By: Linton Ham on 12/02/2017  16:09:51 Hoagland, Alexis Spence ([id number]) -------------------------------------------------------------------------------- Physical Exam Details Patient Name: Alexis Morgans. Date of Service: 12/02/2017 2:00 PM Medical Record Number: [id number] Patient Account Number: [id number] Date of Birth/Sex: 1945-11-22 (71 y.o. F) Treating RN: Cornell Barman Primary Care Provider: Viviana Simpler Other Clinician: Referring Provider: Viviana Simpler Treating Provider/Extender: Tito Dine in Treatment: 9 Constitutional Patient is hypertensive.. Pulse regular and within target range for patient.Marland Kitchen Respirations regular, non-labored and within target range.. Temperature is normal and within the target range for the patient.Marland Kitchen appears in no distress. Eyes Conjunctivae clear. No discharge. Respiratory Respiratory effort is easy and symmetric bilaterally. Rate is normal at rest and on room air.. Cardiovascular Pedal pulses palpable and strong bilaterally.. there is some edema present nonpitting.Marland Kitchen Lymphatic none palpable in the popliteal or inguinal area. Psychiatric No evidence of depression, anxiety, or agitation. Calm, cooperative, and communicative. Appropriate interactions and affect.. Notes wound exam; we continued to make progress in both wound areas. The area on the left is only has a small remnant at the bottom of originally large wound oThe area of the right is also just about closed. oNo evidence of infection in either area no debridement was required Electronic Signature(s) Signed: 12/03/2017 8:15:41 AM By: Linton Ham MD Entered By: Linton Ham on 12/02/2017 16:12:02 Alexis Morgans ([id number]) -------------------------------------------------------------------------------- Physician Orders Details Patient Name: Alexis Morgans. Date of Service: 12/02/2017 2:00 PM Medical Record Number: [id number] Patient Account Number: [id number] Date of Birth/Sex: 16-Jun-1946 (71 y.o.  F) Treating RN: Cornell Barman Primary Care Provider: Viviana Simpler Other Clinician: Referring Provider: Viviana Simpler Treating Provider/Extender: Tito Dine in Treatment: 9 Verbal / Phone Orders: No Diagnosis Coding Wound Cleansing Wound #1 Right,Medial Lower Leg o Clean wound with Normal Saline. o Cleanse wound with mild soap and water Wound #2 Left,Lateral Lower Leg o Clean wound with Normal Saline. o Cleanse wound with mild soap and water Anesthetic (add to Medication List) Wound #1 Right,Medial Lower Leg o Topical Lidocaine 4% cream applied to wound bed prior to debridement (In Clinic Only). Wound #2 Left,Lateral Lower Leg o Topical Lidocaine 4% cream applied to wound bed prior to debridement (In Clinic Only). Primary Wound Dressing Wound #1 Right,Medial Lower Leg o Silver Alginate Wound #2 Left,Lateral Lower Leg o Silver Alginate Secondary Dressing Wound #1 Right,Medial Lower Leg o ABD pad Wound #2 Left,Lateral Lower Leg o ABD pad Dressing Change Frequency Wound #1 Right,Medial Lower Leg o Change dressing every week Wound #2 Left,Lateral Lower Leg o Change dressing every week Follow-up Appointments Wound #1 Right,Medial Lower Leg o Return Appointment in 1 week. o Nurse Visit as needed Wound #2 Left,Lateral Lower Leg [name], DENNE. ([id number]) o Return Appointment in 1 week. o Nurse Visit as needed Edema Control Wound #1 Right,Medial Lower Leg o Kerlix and Coban - Bilateral - unna to anchor o Elevate legs to the level of the heart and pump ankles as often as possible Wound #2 Left,Lateral Lower Leg o Kerlix and Coban - Bilateral - unna to anchor o Elevate legs to the level of the heart and pump ankles as often as possible Additional Orders / Instructions Wound #1 Right,Medial Lower Leg o Increase protein intake. o Other: - Vitamin C, Zinc Wound #2 Left,Lateral Lower Leg o Increase protein  intake. o Other: - Vitamin C,  Zinc Electronic Signature(s) Signed: 12/02/2017 5:06:18 PM By: Gretta Cool, BSN, RN, CWS, Kim RN, BSN Signed: 12/03/2017 8:15:41 AM By: Linton Ham MD Entered By: Gretta Cool, BSN, RN, CWS, Kim on 12/02/2017 14:50:32 Headrick, Alexis Spence ([id number]) -------------------------------------------------------------------------------- Problem List Details Patient Name: [name], BRUNKE. Date of Service: 12/02/2017 2:00 PM Medical Record Number: [id number] Patient Account Number: [id number] Date of Birth/Sex: 1946-06-20 (71 y.o. 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 09/30/2017 Yes L97.218 Non-pressure chronic ulcer of right calf with other specified 09/30/2017 Yes severity E11.40 Type 2 diabetes mellitus with diabetic neuropathy, 09/30/2017 Yes unspecified I87.331 Chronic venous hypertension (idiopathic) with ulcer and 10/07/2017 Yes inflammation of right lower extremity Inactive Problems Resolved Problems Electronic Signature(s) Signed: 12/03/2017 8:15:41 AM By: Linton Ham MD Entered By: Linton Ham on 12/02/2017 16:08:42 Hukill, Alexis Spence ([id number]) -------------------------------------------------------------------------------- Progress Note Details Patient Name: Alexis Morgans. Date of Service: 12/02/2017 2:00 PM Medical Record Number: [id number] Patient Account Number: [id number] Date of Birth/Sex: Oct 07, 1945 (71 y.o. F) Treating RN: Cornell Barman Primary Care Provider: Viviana Simpler Other Clinician: Referring Provider: Viviana Simpler Treating Provider/Extender: Tito Dine in Treatment: 9 Subjective History of Present Illness  (HPI) 09/30/17; patient is a 72 year old woman with type 2 diabetes and peripheral neuropathy. On 08/30/17 she fell down some steps. She developed redness and swelling and was seen in her primary physician's office on 09/11/17. She was felt to have cellulitis and given Keflex. She will return for follow-up on 09/18/17 noted to have an 8 x 4 cm contusion on the left lateral lower leg and a 3 x 3 cm contusion on the right medial lower leg. At follow-up on 09/29/16 she had a 4 x 4 centimeter necrotic surface and she's been referred here. The patient is not on blood thinners she does not have a history of falling. She states her diabetes is under good control ABIs in this clinic could not be obtained because of pain. Patient does not have a prior wound history 10/07/17; the patient came back to the clinic last week for a nurse rewrap. Noted that the area on the right medial leg had opened as well. She is using silver alginate under compression. The substantial wound on the left anterior leg is malodorous today. I did a culture of the wound surface but no empiric antibiotics. The area on the right lateral leg is open with a small draining open area with gelatinous old blood. Using pickups and scalpel I remove necrotic surface from around this area over the entire area, pulse fairly substantially inferiorly. We've been using silver alginate to both wound areas 10/14/17; patient still has substantial drainage from these areas although they actually look some better. The area on the right lateral leg is opened up and there is depth of this. The area on the left leg is larger but more superficial. This area hasn't bridged area of skin that I thought I would have to removed however the granulation as come up and I'm wondering whether we can simply watch this for now these both were originally hematomas from trauma. CULTURE I did last week showed Escherichia coli and Pseudomonas both sensitive to ciprofloxacin. This  was called in for her to replace the doxycycline I prescribed empirically 10/21/17; patient is making reasonably good  progress in both wound areas include using silver alginate. Paradoxically the larger original wound area actually looks better. There is no evidence of infection. 10/28/17; patient continues to make excellent progress in both wound areas using silver alginate. I have been pleasantly surprised by the progress. I note ABIs of 1.25 on the right and 1.24 on the left. 11/04/17. Both the patient's wound area on the medial right calf and on the left anterior calf continue to make rapid progress. We have been using silver alginate 11/11/17; both of the patient's wounds continue to make nice progress using silver alginate. 11/18/17; the patient is doing nicely using silver alginate under compression. She is leaving this on a week 11/25/17-she is here in follow-up evaluation for bilateral lower extremity ulcerations. There is significant improvement to all areas, there is hyper granulation noted to the right lower extremity which was debrided. We will continue with same treatment plan and she will follow-up next week 12/02/17; bilateral lower extremity ulcerations which were initially traumatic. Large hematomas. She is generally done very well. We have been using silver alginate. No debridement is necessary Objective [name], CUM[place]. ([id number]) Constitutional Patient is hypertensive.. Pulse regular and within target range for patient.Marland Kitchen Respirations regular, non-labored and within target range.. Temperature is normal and within the target range for the patient.Marland Kitchen appears in no distress. Vitals Time Taken: 2:10 PM, Height: 61 in, Weight: 179.1 lbs, BMI: 33.8, Temperature: 97.5 F, Pulse: 76 bpm, Respiratory Rate: 16 breaths/min, Blood Pressure: 150/69 mmHg. Eyes Conjunctivae clear. No discharge. Respiratory Respiratory effort is easy and symmetric bilaterally. Rate is normal at rest and on  room air.. Cardiovascular Pedal pulses palpable and strong bilaterally.. there is some edema present nonpitting.Marland Kitchen Lymphatic none palpable in the popliteal or inguinal area. Psychiatric No evidence of depression, anxiety, or agitation. Calm, cooperative, and communicative. Appropriate interactions and affect.. General Notes: wound exam; we continued to make progress in both wound areas. The area on the left is only has a small remnant at the bottom of originally large wound The area of the right is also just about closed. No evidence of infection in either area no debridement was required Integumentary (Hair, Skin) Wound #1 status is Open. Original cause of wound was Trauma. The wound is located on the Right,Medial Lower Leg. The wound measures 0.4cm length x 0.4cm width x 0.2cm depth; 0.126cm^2 area and 0.025cm^3 volume. There is Fat Layer (Subcutaneous Tissue) Exposed exposed. There is no tunneling or undermining noted. There is a large amount of serosanguineous drainage noted. The wound margin is distinct with the outline attached to the wound base. There is medium (34-66%) red granulation within the wound bed. There is a medium (34-66%) amount of necrotic tissue within the wound bed including Eschar and Adherent Slough. The periwound skin appearance exhibited: Dry/Scaly. The periwound skin appearance did not exhibit: Callus, Crepitus, Excoriation, Induration, Rash, Scarring, Maceration, Atrophie Blanche, Cyanosis, Ecchymosis, Hemosiderin Staining, Mottled, Pallor, Rubor, Erythema. Periwound temperature was noted as No Abnormality. The periwound has tenderness on palpation. Wound #2 status is Open. Original cause of wound was Trauma. The wound is located on the Left,Lateral Lower Leg. The wound measures 0.7cm length x 0.6cm width x 0.2cm depth; 0.33cm^2 area and 0.066cm^3 volume. There is Fat Layer (Subcutaneous Tissue) Exposed exposed. There is no tunneling or undermining noted. There is a  large amount of serosanguineous drainage noted. The wound margin is distinct with the outline attached to the wound base. There is medium (34-66%) red granulation within the wound bed.  There is a medium (34-66%) amount of necrotic tissue within the wound bed including Eschar and Adherent Slough. The periwound skin appearance did not exhibit: Callus, Crepitus, Excoriation, Induration, Rash, Scarring, Dry/Scaly, Maceration, Atrophie Blanche, Cyanosis, Ecchymosis, Hemosiderin Staining, Mottled, Pallor, Rubor, Erythema. Periwound temperature was noted as No Abnormality. The periwound has tenderness on palpation. Assessment Active Problems ICD-10 L97.223 - 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e L97.218 - Non-pressure chronic ulcer of right calf with other specified severity [name], LEHTINEN. ([id number]) E11.40 - Type 2 diabetes mellitus with diabetic neuropathy, unspecified I87.331 - Chronic venous hypertension (idiopathic) with ulcer and inflammation of right lower extremity Plan Wound Cleansing: Wound #1 Right,Medial Lower Leg: Clean wound with Normal Saline. Cleanse wound with mild soap and water Wound #2 Left,Lateral Lower Leg: Clean wound with Normal Saline. Cleanse wound with mild soap and water Anesthetic (add to Medication List): Wound #1 Right,Medial Lower Leg: Topical Lidocaine 4% cream applied to wound bed prior to debridement (In Clinic Only). Wound #2 Left,Lateral Lower Leg: Topical Lidocaine 4% cream applied to wound bed prior to debridement (In Clinic Only). Primary Wound Dressing: Wound #1 Right,Medial Lower Leg: Silver Alginate Wound #2 Left,Lateral Lower Leg: Silver Alginate Secondary Dressing: Wound #1 Right,Medial Lower Leg: ABD pad Wound #2 Left,Lateral Lower Leg: ABD pad Dressing Change Frequency: Wound #1 Right,Medial Lower Leg: Change dressing every week Wound #2 Left,Lateral Lower Leg: Change dressing every week Follow-up  Appointments: Wound #1 Right,Medial Lower Leg: Return Appointment in 1 week. Nurse Visit as needed Wound #2 Left,Lateral Lower Leg: Return Appointment in 1 week. Nurse Visit as needed Edema Control: Wound #1 Right,Medial Lower Leg: Kerlix and Coban - Bilateral - unna to anchor Elevate legs to the level of the heart and pump ankles as often as possible Wound #2 Left,Lateral Lower Leg: Kerlix and Coban - Bilateral - unna to anchor Elevate legs to the level of the heart and pump ankles as often as possible Additional Orders / Instructions: Wound #1 Right,Medial Lower Leg: Increase protein intake. Other: - Vitamin C, Zinc Wound #2 Left,Lateral Lower Leg: Increase protein intake. Other: - Vitamin C, Zinc [name], [name] ([id number]) #1continue silver alginate to both wound areas #2 this should be closed next week #3 continued compression. #4 she does not have a history of wounds on either lower extremity. It looks like she has some component of chronic venous insufficiency with secondary lymphedema however with the absence of any wound history it is difficult to justify ongoing compression Electronic Signature(s) Signed: 12/03/2017 8:15:41 AM By: Linton Ham MD Entered By: Linton Ham on 12/02/2017 16:13:03 Harrington, Alexis Spence ([id number]) -------------------------------------------------------------------------------- SuperBill Details Patient Name: Alexis Morgans. Date of Service: 12/02/2017 Medical Record Number: [id number] Patient Account Number: [id number] Date of Birth/Sex: 12/12/1945 (72 y.o. F) Treating RN: Cornell Barman Primary Care Provider: Viviana Simpler Other Clinician: Referring Provider: Viviana Simpler Treating Provider/Extender: Tito Dine in Treatment: 9 Diagnosis Coding ICD-10 Codes Code Description [id number] 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e L97.218 Non-pressure chronic ulcer of right calf with other specified  severity E11.40 Type 2 diabetes mellitus with diabetic neuropathy, unspecified I87.331 Chronic venous hypertension (idiopathic) with ulcer and inflammation of right lower extremity Facility Procedures CPT4 Code: 41324401 Description: 99213 - WOUND CARE VISIT-LEV 3 EST PT Modifier: Quantity: 1 Physician Procedures CPT4 Code: [id number] Description: 64403 - WC PHYS LEVEL 3 - EST PT ICD-10 Diagnosis Description 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 L97.218  Non-pressure chronic ulcer of right calf with other specifie Modifier: cle d severity Quantity: 1 Electronic Signature(s) Signed: 12/03/2017 8:52:12 AM By: Gretta Cool, BSN, RN, CWS, Kim RN, BSN Previous Signature: 12/03/2017 8:15:41 AM Version By: Linton Ham MD Entered By: Gretta Cool, BSN, RN, CWS, Kim on 12/03/2017 08:52:12

## 2017-12-04 NOTE — Progress Notes (Signed)
ARISBEL, MAIONE ([id number]) Visit Report for 12/02/2017 Arrival Information Details Patient Name: [name], [name]. Date of Service: 12/02/2017 2:00 PM Medical Record Number: [id number] Patient Account Number: [id number] Date of Birth/Sex: [birth year] (71 y.o. F) Treating RN: Roger Shelter Primary Care Halana Deisher: Viviana Simpler Other Clinician: Referring Miner Koral: Viviana Simpler Treating Burak Zerbe/Extender: Tito Dine in Treatment: 9 Visit Information History Since Last Visit All ordered tests and consults were completed: No Patient Arrived: Ambulatory Added or deleted any medications: No Arrival Time: 14:06 Any new allergies or adverse reactions: No Accompanied By: self Had a fall or experienced change in No Transfer Assistance: None activities of daily living that may affect Patient Identification Verified: Yes risk of falls: Secondary Verification Process Completed: Yes Signs or symptoms of abuse/neglect since last visito No Patient Requires Transmission-Based No Hospitalized since last visit: No Precautions: Implantable device outside of the clinic excluding No Patient Has Alerts: Yes cellular tissue based products placed in the center Patient Alerts: DM II since last visit: Pain Present Now: No Electronic Signature(s) Signed: 12/02/2017 4:10:38 PM By: Roger Shelter Entered By: Roger Shelter on 12/02/2017 14:07:09 [place]all[place], [name] ([id number]) -------------------------------------------------------------------------------- Clinic Level of Care Assessment Details Patient Name: [name]. Date of Service: 12/02/2017 2:00 PM Medical Record Number: [id number] Patient Account Number: [id number] Date of Birth/Sex: Sep 30, 1945 (71 y.o. F) Treating RN: Cornell Barman Primary Care Agustin Swatek: Viviana Simpler Other Clinician: Referring Kemisha Bonnette: Viviana Simpler Treating Randalyn Ahmed/Extender: Tito Dine in Treatment: 9 Clinic Level of Care  Assessment Items TOOL 4 Quantity Score []  - Use when only an EandM is performed on FOLLOW-UP visit 0 ASSESSMENTS - Nursing Assessment / Reassessment []  - Reassessment of Co-morbidities (includes updates in patient status) 0 X- 1 5 Reassessment of Adherence to Treatment Plan ASSESSMENTS - Wound and Skin Assessment / Reassessment []  - Simple Wound Assessment / Reassessment - one wound 0 X- 2 5 Complex Wound Assessment / Reassessment - multiple wounds []  - 0 Dermatologic / Skin Assessment (not related to wound area) ASSESSMENTS - Focused Assessment []  - Circumferential Edema Measurements - multi extremities 0 []  - 0 Nutritional Assessment / Counseling / Intervention []  - 0 Lower Extremity Assessment (monofilament, tuning fork, pulses) []  - 0 Peripheral Arterial Disease Assessment (using hand held doppler) ASSESSMENTS - Ostomy and/or Continence Assessment and Care []  - Incontinence Assessment and Management 0 []  - 0 Ostomy Care Assessment and Management (repouching, etc.) PROCESS - Coordination of Care X - Simple Patient / Family Education for ongoing care 1 15 []  - 0 Complex (extensive) Patient / Family Education for ongoing care []  - 0 Staff obtains Programmer, systems, Records, Test Results / Process Orders []  - 0 Staff telephones HHA, Nursing Homes / Clarify orders / etc []  - 0 Routine Transfer to another Facility (non-emergent condition) []  - 0 Routine Hospital Admission (non-emergent condition) []  - 0 New Admissions / Biomedical engineer / Ordering NPWT, Apligraf, etc. []  - 0 Emergency Hospital Admission (emergent condition) X- 1 10 Simple Discharge Coordination [name], [name] ([id number]) []  - 0 Complex (extensive) Discharge Coordination PROCESS - Special Needs []  - Pediatric / Minor Patient Management 0 []  - 0 Isolation Patient Management []  - 0 Hearing / Language / Visual special needs []  - 0 Assessment of Community assistance (transportation, D/C planning,  etc.) []  - 0 Additional assistance / Altered mentation []  - 0 Support Surface(s) Assessment (bed, cushion, seat, etc.) INTERVENTIONS - Wound Cleansing / Measurement []  - Simple Wound Cleansing - one wound 0 X- 2 5  Complex Wound Cleansing - multiple wounds X- 1 5 Wound Imaging (photographs - any number of wounds) []  - 0 Wound Tracing (instead of photographs) []  - 0 Simple Wound Measurement - one wound X- 2 5 Complex Wound Measurement - multiple wounds INTERVENTIONS - Wound Dressings []  - Small Wound Dressing one or multiple wounds 0 []  - 0 Medium Wound Dressing one or multiple wounds X- 2 20 Large Wound Dressing one or multiple wounds []  - 0 Application of Medications - topical []  - 0 Application of Medications - injection INTERVENTIONS - Miscellaneous []  - External ear exam 0 []  - 0 Specimen Collection (cultures, biopsies, blood, body fluids, etc.) []  - 0 Specimen(s) / Culture(s) sent or taken to Lab for analysis []  - 0 Patient Transfer (multiple staff / Civil Service fast streamer / Similar devices) []  - 0 Simple Staple / Suture removal (25 or less) []  - 0 Complex Staple / Suture removal (26 or more) []  - 0 Hypo / Hyperglycemic Management (close monitor of Blood Glucose) []  - 0 Ankle / Brachial Index (ABI) - do not check if billed separately X- 1 5 Vital Signs [name], [name] ([id number]) Has the patient been seen at the hospital within the last three years: Yes Total Score: 110 Level Of Care: New/Established - Level 3 Electronic Signature(s) Signed: 12/03/2017 9:10:06 AM By: Gretta Cool, BSN, RN, CWS, Kim RN, BSN Entered By: Gretta Cool, BSN, RN, CWS, Kim on 12/03/2017 08:52:00 [name], [name] ([id number]) -------------------------------------------------------------------------------- Encounter Discharge Information Details Patient Name: [name]. Date of Service: 12/02/2017 2:00 PM Medical Record Number: [id number] Patient Account Number: [id number] Date of Birth/Sex:  1945/12/02 (71 y.o. F) Treating RN: Montey Hora Primary Care Rollin Kotowski: Viviana Simpler Other Clinician: Referring Aadhira Heffernan: Viviana Simpler Treating Wilferd Ritson/Extender: Tito Dine in Treatment: 9 Encounter Discharge Information Items Discharge Pain Level: 0 Discharge Condition: Stable Ambulatory Status: Ambulatory Discharge Destination: Home Transportation: Private Auto Accompanied By: self Schedule Follow-up Appointment: Yes Medication Reconciliation completed and No provided to Patient/Care Tasneem Cormier: Provided on Clinical Summary of Care: 12/02/2017 Form Type Recipient Paper Patient LK Electronic Signature(s) Signed: 12/03/2017 9:24:56 AM By: Ruthine Dose Entered By: Ruthine Dose on 12/02/2017 15:13:09 Grima, [name] ([id number]) -------------------------------------------------------------------------------- Lower Extremity Assessment Details Patient Name: [name]. Date of Service: 12/02/2017 2:00 PM Medical Record Number: [id number] Patient Account Number: [id number] Date of Birth/Sex: 27-Jun-1946 (71 y.o. F) Treating RN: Roger Shelter Primary Care Recie Cirrincione: Viviana Simpler Other Clinician: Referring Theoren Palka: Viviana Simpler Treating Alajah Witman/Extender: Tito Dine in Treatment: 9 Edema Assessment Assessed: [Left: No] Patrice Paradise: No] [Left: Edema] [Right: :] Calf Left: Right: Point of Measurement: 34 cm From Medial Instep 34.5 cm 35.5 cm Ankle Left: Right: Point of Measurement: 9 cm From Medial Instep 24 cm 23 cm Vascular Assessment Claudication: Claudication Assessment [Left:None] [Right:None] Pulses: Dorsalis Pedis Palpable: [Left:Yes] [Right:Yes] Posterior Tibial Extremity colors, hair growth, and conditions: Extremity Color: [Left:Normal] [Right:Normal] Hair Growth on Extremity: [Left:Yes] [Right:Yes] Temperature of Extremity: [Left:Warm] [Right:Warm] Capillary Refill: [Left:< 3 seconds] [Right:< 3 seconds] Toe Nail  Assessment Left: Right: Thick: Yes Yes Discolored: Yes Yes Deformed: No No Improper Length and Hygiene: No No Electronic Signature(s) Signed: 12/02/2017 4:10:38 PM By: Roger Shelter Entered By: Roger Shelter on 12/02/2017 14:22:28 [name], [name] ([id number]) -------------------------------------------------------------------------------- Multi Wound Chart Details Patient Name: [name]. Date of Service: 12/02/2017 2:00 PM Medical Record Number: [id number] Patient Account Number: [id number] Date of Birth/Sex: 08-08-1946 (71 y.o. F) Treating RN: Cornell Barman Primary Care Philbert Ocallaghan: Viviana Simpler Other Clinician: Referring Martrell Eguia: Silvio Pate  Richard Treating Danyael Alipio/Extender: Ricard Dillon Weeks in Treatment: 9 Vital Signs Height(in): 61 Pulse(bpm): 85 Weight(lbs): 179.1 Blood Pressure(mmHg): 150/69 Body Mass Index(BMI): 34 Temperature(F): 97.5 Respiratory Rate 16 (breaths/min): Photos: [1:No Photos] [2:No Photos] [N/A:N/A] Wound Location: [1:Right Lower Leg - Medial] [2:Left Lower Leg - Lateral] [N/A:N/A] Wounding Event: [1:Trauma] [2:Trauma] [N/A:N/A] Primary Etiology: [1:Diabetic Wound/Ulcer of the Lower Extremity] [2:Diabetic Wound/Ulcer of the Lower Extremity] [N/A:N/A] Secondary Etiology: [1:Trauma, Other] [2:Trauma, Other] [N/A:N/A] Comorbid History: [1:Hypertension, Type II Diabetes, Osteoarthritis, Neuropathy] [2:Hypertension, Type II Diabetes, Osteoarthritis, Neuropathy] [N/A:N/A] Date Acquired: [1:08/30/2017] [2:08/30/2017] [N/A:N/A] Weeks of Treatment: [1:9] [2:9] [N/A:N/A] Wound Status: [1:Open] [2:Open] [N/A:N/A] Measurements L x W x D [1:0.4x0.4x0.2] [2:0.7x0.6x0.2] [N/A:N/A] (cm) Area (cm) : [1:0.126] [2:0.33] [N/A:N/A] Volume (cm) : [1:0.025] [2:0.066] [N/A:N/A] % Reduction in Area: [1:98.50%] [2:98.30%] [N/A:N/A] % Reduction in Volume: [1:97.00%] [2:99.50%] [N/A:N/A] Classification: [1:Grade 2] [2:Grade 2] [N/A:N/A] Exudate Amount:  [1:Large] [2:Large] [N/A:N/A] Exudate Type: [1:Serosanguineous] [2:Serosanguineous] [N/A:N/A] Exudate Color: [1:red, brown] [2:red, brown] [N/A:N/A] Wound Margin: [1:Distinct, outline attached] [2:Distinct, outline attached] [N/A:N/A] Granulation Amount: [1:Medium (34-66%)] [2:Medium (34-66%)] [N/A:N/A] Granulation Quality: [1:Red] [2:Red] [N/A:N/A] Necrotic Amount: [1:Medium (34-66%)] [2:Medium (34-66%)] [N/A:N/A] Necrotic Tissue: [1:Eschar, Adherent Slough] [2:Eschar, Adherent Slough] [N/A:N/A] Exposed Structures: [1:Fat Layer (Subcutaneous Tissue) Exposed: Yes Fascia: No Tendon: No Muscle: No Joint: No Bone: No] [2:Fat Layer (Subcutaneous Tissue) Exposed: Yes Fascia: No Tendon: No Muscle: No Joint: No Bone: No] [N/A:N/A] Epithelialization: [1:Small (1-33%)] [2:Small (1-33%)] [N/A:N/A] Periwound Skin Texture: [1:Excoriation: No Induration: No] [2:Excoriation: No Induration: No] [N/A:N/A] Callus: No Callus: No Crepitus: No Crepitus: No Rash: No Rash: No Scarring: No Scarring: No Periwound Skin Moisture: Dry/Scaly: Yes Maceration: No N/A Maceration: No Dry/Scaly: No Periwound Skin Color: Atrophie Blanche: No Atrophie Blanche: No N/A Cyanosis: No Cyanosis: No Ecchymosis: No Ecchymosis: No Erythema: No Erythema: No Hemosiderin Staining: No Hemosiderin Staining: No Mottled: No Mottled: No Pallor: No Pallor: No Rubor: No Rubor: No Temperature: No Abnormality No Abnormality N/A Tenderness on Palpation: Yes Yes N/A Wound Preparation: Ulcer Cleansing: Ulcer Cleansing: N/A Rinsed/Irrigated with Saline Rinsed/Irrigated with Saline Topical Anesthetic Applied: Topical Anesthetic Applied: Other: lidocaine 4% Other: lidocaine 4% Treatment Notes Wound #1 (Right, Medial Lower Leg) 1. Cleansed with: Clean wound with Normal Saline 2. Anesthetic Topical Lidocaine 4% cream to wound bed prior to debridement 4. Dressing Applied: Other dressing (specify in notes) 5. Secondary  Dressing Applied ABD Pad 7. Secured with Tape Notes silvercel, kerlix, coban, unna to anchor Wound #2 (Left, Lateral Lower Leg) 1. Cleansed with: Clean wound with Normal Saline 2. Anesthetic Topical Lidocaine 4% cream to wound bed prior to debridement 4. Dressing Applied: Other dressing (specify in notes) 5. Secondary Dressing Applied ABD Pad 7. Secured with Tape Notes silvercel, kerlix, coban, unna to [employer]) [name], [name] ([id number]) Signed: 12/03/2017 8:15:41 AM By: Linton Ham MD Entered By: Linton Ham on 12/02/2017 16:08:57 [name] ([id number]) -------------------------------------------------------------------------------- [place] Details Patient Name: MAELY, CLEMENTS. Date of Service: 12/02/2017 2:00 PM Medical Record Number: [id number] Patient Account Number: [id number] Date of Birth/Sex: 1946-04-07 (71 y.o. F) Treating RN: Cornell Barman Primary Care Annise Boran: Viviana Simpler Other Clinician: Referring Krishauna Schatzman: Viviana Simpler Treating Elmyra Banwart/Extender: Tito Dine in Treatment: 9 Active Inactive ` Abuse / Safety / Falls / Self Care Management Nursing Diagnoses: History of Falls Potential for falls Goals: Patient will not experience any injury related to falls Date Initiated: 09/30/2017 Target Resolution Date: 01/16/2018 Goal Status: Active Interventions: Assess Activities of Daily Living upon admission and as needed Assess  fall risk on admission and as needed Assess: immobility, friction, shearing, incontinence upon admission and as needed Assess impairment of mobility on admission and as needed per policy Notes: ` Nutrition Nursing Diagnoses: Imbalanced nutrition Impaired glucose control: actual or potential Potential for alteratiion in Nutrition/Potential for imbalanced nutrition Goals: Patient/caregiver agrees to and verbalizes understanding of need to use nutritional  supplements and/or vitamins as prescribed Date Initiated: 09/30/2017 Target Resolution Date: 01/16/2018 Goal Status: Active Patient/caregiver will maintain therapeutic glucose control Date Initiated: 09/30/2017 Target Resolution Date: 01/16/2018 Goal Status: Active Interventions: Assess patient nutrition upon admission and as needed per policy Provide education on elevated blood sugars and impact on wound healing Notes: [name], [name] ([id number]) Orientation to the Wound Care Program Nursing Diagnoses: Knowledge deficit related to the wound healing center program Goals: Patient/caregiver will verbalize understanding of the [place] [facility] Date Initiated: 09/30/2017 Target Resolution Date: 10/17/2017 Goal Status: Active Interventions: Provide education on orientation to the wound center Notes: ` Pain, Acute or Chronic Nursing Diagnoses: Pain, acute or chronic: actual or potential Potential alteration in comfort, pain Goals: Patient/caregiver will verbalize adequate pain control between visits Date Initiated: 09/30/2017 Target Resolution Date: 01/16/2018 Goal Status: Active Interventions: Complete pain assessment as per visit requirements Notes: ` Wound/Skin Impairment Nursing Diagnoses: Impaired tissue integrity Knowledge deficit related to ulceration/compromised skin integrity Goals: Ulcer/skin breakdown will have a volume reduction of 80% by week 12 Date Initiated: 09/30/2017 Target Resolution Date: 01/16/2018 Goal Status: Active Interventions: Assess patient/caregiver ability to perform ulcer/skin care regimen upon admission and as needed Assess ulceration(s) every visit Notes: Electronic Signature(s) Signed: 12/02/2017 5:06:18 PM By: Gretta Cool, BSN, RN, CWS, Kim RN, BSN Entered By: Gretta Cool, BSN, RN, CWS, Kim on 12/02/2017 14:48:50 [name], [name] ([id number]) [name], [name]  ([id number]) -------------------------------------------------------------------------------- Pain Assessment Details Patient Name: [name]. Date of Service: 12/02/2017 2:00 PM Medical Record Number: [id number] Patient Account Number: [id number] Date of Birth/Sex: [birth year] (71 y.o. F) Treating RN: Roger Shelter Primary Care Custer Pimenta: Viviana Simpler Other Clinician: Referring Chamia Schmutz: Viviana Simpler Treating Marley Pakula/Extender: Tito Dine in Treatment: 9 Active Problems Location of Pain Severity and Description of Pain Patient Has Paino No Site Locations Pain Management and Medication Current Pain Management: Electronic Signature(s) Signed: 12/02/2017 4:10:38 PM By: Roger Shelter Entered By: Roger Shelter on 12/02/2017 14:07:16 [name] ([id number]) -------------------------------------------------------------------------------- Patient/Caregiver Education Details Patient Name: [name]. Date of Service: 12/02/2017 2:00 PM Medical Record Number: [id number] Patient Account Number: [id number] Date of Birth/Gender: 1945-10-06 (71 y.o. F) Treating RN: Montey Hora Primary Care Physician: Viviana Simpler Other Clinician: Referring Physician: Viviana Simpler Treating Physician/Extender: Tito Dine in Treatment: 9 Education Assessment Education Provided To: Patient Education Topics Provided Wound/Skin Impairment: Handouts: Caring for Your Ulcer, Other: change dressing as ordered Methods: Demonstration, Explain/Verbal Responses: State content correctly Electronic Signature(s) Signed: 12/02/2017 4:02:56 PM By: Montey Hora Entered By: Montey Hora on 12/02/2017 15:06:36 [name], [name] ([id number]) -------------------------------------------------------------------------------- Wound Assessment Details Patient Name: [name]. Date of Service: 12/02/2017 2:00 PM Medical Record Number: [id number] Patient  Account Number: [id number] Date of Birth/Sex: [birth year] (71 y.o. F) Treating RN: Roger Shelter Primary Care Sabrinna Yearwood: Viviana Simpler Other Clinician: Referring Marianna Cid: Viviana Simpler Treating Trelon Plush/Extender: Tito Dine in Treatment: 9 Wound Status Wound Number: 1 Primary Etiology: Diabetic Wound/Ulcer of the Lower Extremity Wound Location: Right Lower Leg - Medial Secondary Trauma, Other Wounding Event: Trauma Etiology: Date Acquired: 08/30/2017 Wound Status: Open Weeks Of Treatment: 9 Comorbid Hypertension, Type  II Diabetes, Clustered Wound: No History: Osteoarthritis, Neuropathy Wound Measurements Length: (cm) 0.4 Width: (cm) 0.4 Depth: (cm) 0.2 Area: (cm) 0.126 Volume: (cm) 0.025 % Reduction in Area: 98.5% % Reduction in Volume: 97% Epithelialization: Small (1-33%) Tunneling: No Undermining: No Wound Description Classification: Grade 2 Wound Margin: Distinct, outline attached Exudate Amount: Large Exudate Type: Serosanguineous Exudate Color: red, brown Foul Odor After Cleansing: No Slough/Fibrino Yes Wound Bed Granulation Amount: Medium (34-66%) Exposed Structure Granulation Quality: Red Fascia Exposed: No Necrotic Amount: Medium (34-66%) Fat Layer (Subcutaneous Tissue) Exposed: Yes Necrotic Quality: Eschar, Adherent Slough Tendon Exposed: No Muscle Exposed: No Joint Exposed: No Bone Exposed: No Periwound Skin Texture Texture Color No Abnormalities Noted: No No Abnormalities Noted: No Callus: No Atrophie Blanche: No Crepitus: No Cyanosis: No Excoriation: No Ecchymosis: No Induration: No Erythema: No Rash: No Hemosiderin Staining: No Scarring: No Mottled: No Pallor: No Moisture Rubor: No No Abnormalities Noted: No Dry / Scaly: Yes Temperature / Pain Maceration: No Temperature: No Abnormality [place], Jaydalyn M. ([id number]) Tenderness on Palpation: Yes Wound Preparation Ulcer Cleansing: Rinsed/Irrigated with  Saline Topical Anesthetic Applied: Other: lidocaine 4%, Treatment Notes Wound #1 (Right, Medial Lower Leg) 1. Cleansed with: Clean wound with Normal Saline 2. Anesthetic Topical Lidocaine 4% cream to wound bed prior to debridement 4. Dressing Applied: Other dressing (specify in notes) 5. Secondary Dressing Applied ABD Pad 7. Secured with Tape Notes silvercel, kerlix, coban, unna to Engineer, production) Signed: 12/02/2017 4:10:38 PM By: Roger Shelter Entered By: Roger Shelter on 12/02/2017 14:19:28 [name], [name] ([id number]) -------------------------------------------------------------------------------- Wound Assessment Details Patient Name: [name]. Date of Service: 12/02/2017 2:00 PM Medical Record Number: [id number] Patient Account Number: [id number] Date of Birth/Sex: 28-Apr-1946 (71 y.o. F) Treating RN: Roger Shelter Primary Care Bethan Adamek: Viviana Simpler Other Clinician: Referring Daryan Cagley: Viviana Simpler Treating Keyshawna Prouse/Extender: Tito Dine in Treatment: 9 Wound Status Wound Number: 2 Primary Etiology: Diabetic Wound/Ulcer of the Lower Extremity Wound Location: Left Lower Leg - Lateral Secondary Trauma, Other Wounding Event: Trauma Etiology: Date Acquired: 08/30/2017 Wound Status: Open Weeks Of Treatment: 9 Comorbid Hypertension, Type II Diabetes, Clustered Wound: No History: Osteoarthritis, Neuropathy Wound Measurements Length: (cm) 0.7 Width: (cm) 0.6 Depth: (cm) 0.2 Area: (cm) 0.33 Volume: (cm) 0.066 % Reduction in Area: 98.3% % Reduction in Volume: 99.5% Epithelialization: Small (1-33%) Tunneling: No Undermining: No Wound Description Classification: Grade 2 Wound Margin: Distinct, outline attached Exudate Amount: Large Exudate Type: Serosanguineous Exudate Color: red, brown Foul Odor After Cleansing: No Slough/Fibrino Yes Wound Bed Granulation Amount: Medium (34-66%) Exposed  Structure Granulation Quality: Red Fascia Exposed: No Necrotic Amount: Medium (34-66%) Fat Layer (Subcutaneous Tissue) Exposed: Yes Necrotic Quality: Eschar, Adherent Slough Tendon Exposed: No Muscle Exposed: No Joint Exposed: No Bone Exposed: No Periwound Skin Texture Texture Color No Abnormalities Noted: No No Abnormalities Noted: No Callus: No Atrophie Blanche: No Crepitus: No Cyanosis: No Excoriation: No Ecchymosis: No Induration: No Erythema: No Rash: No Hemosiderin Staining: No Scarring: No Mottled: No Pallor: No Moisture Rubor: No No Abnormalities Noted: No Dry / Scaly: No Temperature / Pain Maceration: No Temperature: No Abnormality Backlund, Lura M. ([id number]) Tenderness on Palpation: Yes Wound Preparation Ulcer Cleansing: Rinsed/Irrigated with Saline Topical Anesthetic Applied: Other: lidocaine 4%, Treatment Notes Wound #2 (Left, Lateral Lower Leg) 1. Cleansed with: Clean wound with Normal Saline 2. Anesthetic Topical Lidocaine 4% cream to wound bed prior to debridement 4. Dressing Applied: Other dressing (specify in notes) 5. Secondary Dressing Applied ABD Pad 7. Secured with Tape Notes silvercel, kerlix,  Adora Fridge to Engineer, production) Signed: 12/02/2017 4:10:38 PM By: Roger Shelter Entered By: Roger Shelter on 12/02/2017 14:20:01 [name] ([id number]) -------------------------------------------------------------------------------- [place] Details Patient Name: [name]. Date of Service: 12/02/2017 2:00 PM Medical Record Number: [id number] Patient Account Number: [id number] Date of Birth/Sex: [birth year] (71 y.o. F) Treating RN: Roger Shelter Primary Care Jaynie Hitch: Viviana Simpler Other Clinician: Referring Harrold Fitchett: Viviana Simpler Treating Aniket Paye/Extender: Tito Dine in Treatment: 9 Vital Signs Time Taken: 14:10 Temperature (F): 97.5 Height (in): 61 Pulse (bpm): 76 Weight (lbs):  179.1 Respiratory Rate (breaths/min): 16 Body Mass Index (BMI): 33.8 Blood Pressure (mmHg): 150/69 Reference Range: 80 - 120 mg / dl Electronic Signature(s) Signed: 12/02/2017 4:10:38 PM By: Roger Shelter Entered By: Roger Shelter on 12/02/2017 14:10:16

## 2017-12-06 ENCOUNTER — Encounter: Payer: Self-pay | Admitting: Internal Medicine

## 2017-12-07 ENCOUNTER — Other Ambulatory Visit: Payer: Self-pay | Admitting: Internal Medicine

## 2017-12-09 ENCOUNTER — Encounter: Payer: Self-pay | Admitting: Internal Medicine

## 2017-12-09 ENCOUNTER — Encounter: Payer: Medicare HMO | Attending: Internal Medicine | Admitting: Internal Medicine

## 2017-12-09 ENCOUNTER — Ambulatory Visit (INDEPENDENT_AMBULATORY_CARE_PROVIDER_SITE_OTHER): Payer: Medicare HMO | Admitting: Internal Medicine

## 2017-12-09 VITALS — BP 118/86 | HR 78 | Temp 97.4°F | Ht 61.0 in | Wt 173.0 lb

## 2017-12-09 DIAGNOSIS — E1151 Type 2 diabetes mellitus with diabetic peripheral angiopathy without gangrene: Secondary | ICD-10-CM | POA: Insufficient documentation

## 2017-12-09 DIAGNOSIS — W109XXA Fall (on) (from) unspecified stairs and steps, initial encounter: Secondary | ICD-10-CM | POA: Insufficient documentation

## 2017-12-09 DIAGNOSIS — M25552 Pain in left hip: Secondary | ICD-10-CM | POA: Insufficient documentation

## 2017-12-09 DIAGNOSIS — L97212 Non-pressure chronic ulcer of right calf with fat layer exposed: Secondary | ICD-10-CM | POA: Insufficient documentation

## 2017-12-09 DIAGNOSIS — E11622 Type 2 diabetes mellitus with other skin ulcer: Secondary | ICD-10-CM | POA: Diagnosis not present

## 2017-12-09 DIAGNOSIS — I87331 Chronic venous hypertension (idiopathic) with ulcer and inflammation of right lower extremity: Secondary | ICD-10-CM | POA: Diagnosis present

## 2017-12-09 DIAGNOSIS — I87311 Chronic venous hypertension (idiopathic) with ulcer of right lower extremity: Secondary | ICD-10-CM | POA: Diagnosis not present

## 2017-12-09 DIAGNOSIS — L97222 Non-pressure chronic ulcer of left calf with fat layer exposed: Secondary | ICD-10-CM | POA: Diagnosis not present

## 2017-12-09 MED ORDER — TRAMADOL HCL 50 MG PO TABS: 25.0000 mg | ORAL_TABLET | Freq: Three times a day (TID) | ORAL | 0 refills | Status: DC | PRN

## 2017-12-09 NOTE — Assessment & Plan Note (Signed)
Does have findings suggesting that her worsened pain may be related to her hip--rather than just the back Will have her see ortho about this Try tramadol (despite the duloxetine Rx) to give some relief for now

## 2017-12-09 NOTE — Progress Notes (Signed)
Subjective:    Patient ID: [name], female    DOB: May 08, 1946, 72 y.o.   MRN: [id number]  HPI Here due to ongoing pain issues Worried about her hips  She feels she is getting worse Not sure it is all her back though Hurts with every step---better if she uses walker (but doesn't use it out) Uses umbrella for stability when out [employer] on TV--afraid of walking that far  Not getting much help from gabapentin Ready to try something else  Current Outpatient Medications on File Prior to Visit  Medication Sig Dispense Refill  . ACCU-CHEK AVIVA PLUS test strip USE TO TEST BLOOD SUGAR ONCE DAILY DX: R73.03 100 each 3  . ACCU-CHEK SOFTCLIX LANCETS lancets USE AS INSTRUCTED 100 each 3  . Ascorbic Acid (VITAMIN C WITH ROSE HIPS) 1000 MG tablet Take by mouth.    . Blood Glucose Monitoring Suppl (ACCU-CHEK AVIVA PLUS) w/Device KIT Use to test blood sugar once daily Dx: R73.03 1 kit 0  . buPROPion (WELLBUTRIN) 75 MG tablet Take 1 tablet (75 mg total) by mouth daily with breakfast. 30 tablet 2  . chlorhexidine (PERIDEX) 0.12 % solution RINSE IN THE AM AND PM FOR 30 SECONDS THEN SPIT as needed  1  . Chromium Picolinate 1000 MCG TABS Take by mouth.    . Coenzyme Q10 (CO Q 10 PO) Take 1 capsule by mouth daily.    . cyanocobalamin 500 MCG tablet Take 500 mcg by mouth daily.    . DULoxetine (CYMBALTA) 60 MG capsule Take 1 capsule (60 mg total) by mouth every evening. 30 capsule 3  . gabapentin (NEURONTIN) 300 MG capsule TAKE TWO CAPSULES BY MOUTH TWICE A DAY TAKE 3 CAPSULES AT BEDTIME 210 capsule 11  . lisinopril-hydrochlorothiazide (PRINZIDE,ZESTORETIC) 20-25 MG tablet TAKE 2 TABLETS BY MOUTH DAILY 180 tablet 3  . meloxicam (MOBIC) 7.5 MG tablet TAKE 1 TABLET BY MOUTH EVERY DAY 90 tablet 3  . metFORMIN (GLUCOPHAGE-XR) 500 MG 24 hr tablet TAKE 1 TABLET (500 MG TOTAL) BY MOUTH DAILY WITH BREAKFAST. 90 tablet 3  . Multiple Vitamins-Minerals (MULTIVITAMIN WITH MINERALS) tablet Take 1 tablet  by mouth daily.     . Omega-3 Fatty Acids (FISH OIL) 1000 MG CAPS Take 2 capsules by mouth daily.     . Probiotic Product ([employer] SENIOR PROBIOTIC) CAPS Take 1 capsule by mouth 2 (two) times daily. (Patient taking differently: Take 1 capsule by mouth daily. ) 180 capsule 3  . Soya Lecithin 1200 MG CAPS Take 2 tablets by mouth daily.      No current facility-administered medications on file prior to visit.     No Known Allergies  Past Medical History:  Diagnosis Date  . Arthritis   . Confusion   . Diabetes mellitus without complication ([place])   . Hepatitis   . Hypertension   . Lumbar stenosis   . Memory loss   . Nocturia   . Numbness and tingling    legs and feet  . Wears glasses     Past Surgical History:  Procedure Laterality Date  . [place]  . COLONOSCOPY    . HEMORRHOID SURGERY  2015  . TRANSFORAMINAL LUMBAR INTERBODY FUSION (TLIF) WITH PEDICLE SCREW FIXATION 2 LEVEL N/A 11/07/2015   Procedure: Lumbar three-four Lumbar four-five  transforaminal lumbar interbody fusion with interbody prosthesis posterior lateral arthrodesis and posterior segmental instrumentation;  Surgeon: Newman Pies, MD;  Location: [facility];  Service: Neurosurgery;  Laterality: N/A;  Family History  Problem Relation Age of Onset  . Hypertension Other   . Cancer Other        ovarian  . Cancer Mother   . Heart disease Father     Social History   Socioeconomic History  . Marital status: Divorced    Spouse name: Not on file  . Number of children: 4  . Years of education: Not on file  . Highest education level: Associate degree: occupational, Hotel manager, or vocational program  Occupational History  . Not on file  Social Needs  . Financial resource strain: Not very hard  . Food insecurity:    Worry: Never true    Inability: Never true  . Transportation needs:    Medical: Yes    Non-medical: Yes  Tobacco Use  . Smoking status: Never Smoker  . Smokeless tobacco: Never Used   Substance and Sexual Activity  . Alcohol use: No    Comment: rarely  . Drug use: No  . Sexual activity: Not Currently  Lifestyle  . Physical activity:    Days per week: 0 days    Minutes per session: 0 min  . Stress: To some extent  Relationships  . Social connections:    Talks on phone: Not on file    Gets together: Not on file    Attends religious service: Never    Active member of club or organization: No    Attends meetings of clubs or organizations: Never    Relationship status: Divorced  . Intimate partner violence:    Fear of current or ex partner: No    Emotionally abused: No    Physically abused: No    Forced sexual activity: No  Other Topics Concern  . Not on file  Social History Narrative   Has a living will.   Son is health care POA   Would accept resuscitation.  Would not want prolonged life support if futile.   Probably would not want tube feeds if cognitively unaware   Review of Systems Gets depressed due to pain and disability Got new medication from psychiatrist--waiting to see if it will help (bupropion)    Objective:   Physical Exam  Musculoskeletal:  Right hip has normal ROM Pain with passive movement of the left hip and very limited internal rotation and pain in left  Neurological:  Slow gait           Assessment & Plan:

## 2017-12-10 DIAGNOSIS — H43811 Vitreous degeneration, right eye: Secondary | ICD-10-CM | POA: Diagnosis not present

## 2017-12-10 DIAGNOSIS — H43391 Other vitreous opacities, right eye: Secondary | ICD-10-CM | POA: Diagnosis not present

## 2017-12-14 ENCOUNTER — Telehealth: Payer: Self-pay

## 2017-12-14 ENCOUNTER — Encounter: Payer: Self-pay | Admitting: Internal Medicine

## 2017-12-14 DIAGNOSIS — F320 Major depressive disorder, single episode, mild: Secondary | ICD-10-CM

## 2017-12-14 MED ORDER — DULOXETINE HCL 60 MG PO CPEP: 60.0000 mg | ORAL_CAPSULE | Freq: Every evening | ORAL | 0 refills | Status: DC

## 2017-12-14 MED ORDER — BUPROPION HCL 75 MG PO TABS: 75.0000 mg | ORAL_TABLET | Freq: Every day | ORAL | 0 refills | Status: DC

## 2017-12-14 NOTE — Progress Notes (Signed)
Alexis, GRAUBERGER Velez ([id number]) Visit Report for 12/09/2017 Arrival Information Details Patient Name: [name], [name]. Date of Service: 12/09/2017 2:00 PM Medical Record Number: [id number] Patient Account Number: [id number] Date of Birth/Sex: 25-May-1946 (72 y.o. F) Treating RN: Ahmed Prima Primary Care Nissim Fleischer: Viviana Simpler Other Clinician: Referring Bobetta Korf: Viviana Simpler Treating Zayvien Canning/Extender: Tito Dine in Treatment: 10 Visit Information History Since Last Visit All ordered tests and consults were completed: No Patient Arrived: Ambulatory Added or deleted any medications: No Arrival Time: 14:22 Any new allergies or adverse reactions: No Accompanied By: self Had a fall or experienced change in No Transfer Assistance: None activities of daily living that may affect Patient Identification Verified: Yes risk of falls: Secondary Verification Process Completed: Yes Signs or symptoms of abuse/neglect since last visito No Patient Requires Transmission-Based No Hospitalized since last visit: No Precautions: Implantable device outside of the clinic excluding No Patient Has Alerts: Yes cellular tissue based products placed in the center Patient Alerts: DM II since last visit: Has Dressing in Place as Prescribed: Yes Has Compression in Place as Prescribed: Yes Pain Present Now: Yes Electronic Signature(s) Signed: 12/10/2017 2:53:14 PM By: Roger Shelter Entered By: Roger Shelter on 12/09/2017 14:27:21 Vullo, [name] ([id number]) -------------------------------------------------------------------------------- Encounter Discharge Information Details Patient Name: [name]. Date of Service: 12/09/2017 2:00 PM Medical Record Number: [id number] Patient Account Number: [id number] Date of Birth/Sex: [birth year] (72 y.o. F) Treating RN: Cornell Barman Primary Care Buffie Herne: Viviana Simpler Other Clinician: Referring Naiara Lombardozzi: Viviana Simpler Treating  Treveon Bourcier/Extender: Tito Dine in Treatment: 10 Encounter Discharge Information Items Discharge Pain Level: 0 Discharge Condition: Stable Ambulatory Status: Ambulatory Discharge Destination: Home Transportation: Private Auto Accompanied By: self Schedule Follow-up Appointment: Yes Medication Reconciliation completed and No provided to Patient/Care Payzlee Ryder: Provided on Clinical Summary of Care: 12/09/2017 Form Type Recipient Paper Patient LK Electronic Signature(s) Signed: 12/09/2017 4:17:30 PM By: Montey Hora Previous Signature: 12/09/2017 4:04:39 PM Version By: Ruthine Dose Entered By: Montey Hora on 12/09/2017 16:17:29 Billing, [name] ([id number]) -------------------------------------------------------------------------------- Lower Extremity Assessment Details Patient Name: [name]. Date of Service: 12/09/2017 2:00 PM Medical Record Number: [id number] Patient Account Number: [id number] Date of Birth/Sex: 1945-11-14 (72 y.o. F) Treating RN: Ahmed Prima Primary Care Kemuel Buchmann: Viviana Simpler Other Clinician: Referring Maddock Finigan: Viviana Simpler Treating Arnette Driggs/Extender: Tito Dine in Treatment: 10 Edema Assessment Assessed: Shirlyn Goltz: No] Patrice Paradise: No] [Left: Edema] [Right: :] Calf Left: Right: Point of Measurement: 34 cm From Medial Instep 35.5 cm 37 cm Ankle Left: Right: Point of Measurement: 9 cm From Medial Instep 24 cm 23.5 cm Vascular Assessment Pulses: Dorsalis Pedis Palpable: [Left:Yes] [Right:Yes] Posterior Tibial Extremity colors, hair growth, and conditions: Extremity Color: [Left:Normal] [Right:Normal] Hair Growth on Extremity: [Left:Yes] [Right:Yes] Temperature of Extremity: [Left:Warm] [Right:Warm] Capillary Refill: [Left:< 3 seconds] [Right:< 3 seconds] Toe Nail Assessment Left: Right: Thick: Yes Yes Discolored: No No Deformed: No No Improper Length and Hygiene: No No Electronic Signature(s) Signed: 12/10/2017  4:32:30 PM By: Alric Quan Entered By: Alric Quan on 12/09/2017 14:37:03 Quimby, [name] ([id number]) -------------------------------------------------------------------------------- Multi Wound Chart Details Patient Name: [name]. Date of Service: 12/09/2017 2:00 PM Medical Record Number: [id number] Patient Account Number: [id number] Date of Birth/Sex: Feb 23, 1946 (72 y.o. F) Treating RN: Cornell Barman Primary Care Zillah Alexie: Viviana Simpler Other Clinician: Referring Devlyn Retter: Viviana Simpler Treating Latreece Mochizuki/Extender: Tito Dine in Treatment: 10 Vital Signs Height(in): 61 Pulse(bpm): 106 Weight(lbs): 179.1 Blood Pressure(mmHg): 122/62 Body Mass Index(BMI): 34 Temperature(F): 97.8 Respiratory Rate 18 (breaths/min): Photos: [1:No Photos] [  2:No Photos] [N/A:N/A] Wound Location: [1:Right Lower Leg - Medial] [2:Left Lower Leg - Lateral] [N/A:N/A] Wounding Event: [1:Trauma] [2:Trauma] [N/A:N/A] Primary Etiology: [1:Diabetic Wound/Ulcer of the Lower Extremity] [2:Diabetic Wound/Ulcer of the Lower Extremity] [N/A:N/A] Secondary Etiology: [1:Trauma, Other] [2:Trauma, Other] [N/A:N/A] Comorbid History: [1:Hypertension, Type II Diabetes, Osteoarthritis, Neuropathy] [2:Hypertension, Type II Diabetes, Osteoarthritis, Neuropathy] [N/A:N/A] Date Acquired: [1:08/30/2017] [2:08/30/2017] [N/A:N/A] Weeks of Treatment: [1:10] [2:10] [N/A:N/A] Wound Status: [1:Open] [2:Open] [N/A:N/A] Measurements L x W x D [1:0.3x0.2x0.1] [2:0.5x0.4x0.1] [N/A:N/A] (cm) Area (cm) : [1:0.047] [2:0.157] [N/A:N/A] Volume (cm) : [1:0.005] [2:0.016] [N/A:N/A] % Reduction in Area: [1:99.40%] [2:99.20%] [N/A:N/A] % Reduction in Volume: [1:99.40%] [2:99.90%] [N/A:N/A] Classification: [1:Grade 2] [2:Grade 2] [N/A:N/A] Exudate Amount: [1:Medium] [2:Large] [N/A:N/A] Exudate Type: [1:Serosanguineous] [2:Serosanguineous] [N/A:N/A] Exudate Color: [1:red, brown] [2:red, brown]  [N/A:N/A] Wound Margin: [1:Distinct, outline attached] [2:Distinct, outline attached] [N/A:N/A] Granulation Amount: [1:Large (67-100%)] [2:None Present (0%)] [N/A:N/A] Granulation Quality: [1:Red] [2:N/A] [N/A:N/A] Necrotic Amount: [1:Small (1-33%)] [2:Large (67-100%)] [N/A:N/A] Necrotic Tissue: [1:Adherent Slough] [2:Eschar, Adherent Slough] [N/A:N/A] Exposed Structures: [1:Fat Layer (Subcutaneous Tissue) Exposed: Yes Fascia: No Tendon: No Muscle: No Joint: No Bone: No] [2:Fat Layer (Subcutaneous Tissue) Exposed: Yes Fascia: No Tendon: No Muscle: No Joint: No Bone: No] [N/A:N/A] Epithelialization: [1:Small (1-33%)] [2:Small (1-33%)] [N/A:N/A] Debridement: [1:N/A N/A] [2:Debridement - Excisional 02:42] [N/A:N/A N/A] Pre-procedure Verification/Time Out Taken: Pain Control: N/A Other N/A Tissue Debrided: N/A Subcutaneous, Slough N/A Level: N/A Skin/Subcutaneous Tissue N/A Debridement Area (sq cm): N/A 0.2 N/A Instrument: N/A Curette N/A Bleeding: N/A None N/A Procedural Pain: N/A 1 N/A Post Procedural Pain: N/A 0 N/A Debridement Treatment N/A Procedure was tolerated well N/A Response: Post Debridement N/A 0.5x0.4x0.1 N/A Measurements L x W x D (cm) Post Debridement Volume: N/A 0.016 N/A (cm) Periwound Skin Texture: Excoriation: No Excoriation: No N/A Induration: No Induration: No Callus: No Callus: No Crepitus: No Crepitus: No Rash: No Rash: No Scarring: No Scarring: No Periwound Skin Moisture: Dry/Scaly: Yes Maceration: No N/A Maceration: No Dry/Scaly: No Periwound Skin Color: Atrophie Blanche: No Atrophie Blanche: No N/A Cyanosis: No Cyanosis: No Ecchymosis: No Ecchymosis: No Erythema: No Erythema: No Hemosiderin Staining: No Hemosiderin Staining: No Mottled: No Mottled: No Pallor: No Pallor: No Rubor: No Rubor: No Temperature: No Abnormality No Abnormality N/A Tenderness on Palpation: Yes Yes N/A Wound Preparation: Ulcer Cleansing: Ulcer Cleansing:  N/A Rinsed/Irrigated with Saline Rinsed/Irrigated with Saline Topical Anesthetic Applied: Topical Anesthetic Applied: Other: lidocaine 4% Other: lidocaine 4% Procedures Performed: N/A Debridement N/A Treatment Notes Electronic Signature(s) Signed: 12/09/2017 4:49:21 PM By: Linton Ham MD Entered By: Linton Ham on 12/09/2017 15:16:09 Granderson, [name] ([id number]) -------------------------------------------------------------------------------- [place] Details Patient Name: [name]. Date of Service: 12/09/2017 2:00 PM Medical Record Number: [id number] Patient Account Number: [id number] Date of Birth/Sex: [birth year] (72 y.o. F) Treating RN: Cornell Barman Primary Care Nyaira Hodgens: Viviana Simpler Other Clinician: Referring Jacub Waiters: Viviana Simpler Treating Tzirel Leonor/Extender: Tito Dine in Treatment: 10 Active Inactive ` Abuse / Safety / Falls / Self Care Management Nursing Diagnoses: History of Falls Potential for falls Goals: Patient will not experience any injury related to falls Date Initiated: 09/30/2017 Target Resolution Date: 01/16/2018 Goal Status: Active Interventions: Assess Activities of Daily Living upon admission and as needed Assess fall risk on admission and as needed Assess: immobility, friction, shearing, incontinence upon admission and as needed Assess impairment of mobility on admission and as needed per policy Notes: ` Nutrition Nursing Diagnoses: Imbalanced nutrition Impaired glucose control: actual or potential Potential for alteratiion in Nutrition/Potential for imbalanced nutrition Goals: Patient/caregiver agrees  to and verbalizes understanding of need to use nutritional supplements and/or vitamins as prescribed Date Initiated: 09/30/2017 Target Resolution Date: 01/16/2018 Goal Status: Active Patient/caregiver will maintain therapeutic glucose control Date Initiated: 09/30/2017 Target Resolution Date:  01/16/2018 Goal Status: Active Interventions: Assess patient nutrition upon admission and as needed per policy Provide education on elevated blood sugars and impact on wound healing Notes: EMALY, BOSCHERT ([id number]) Orientation to the Wound Care Program Nursing Diagnoses: Knowledge deficit related to the wound healing center program Goals: Patient/caregiver will verbalize understanding of the [facility] Date Initiated: 09/30/2017 Target Resolution Date: 10/17/2017 Goal Status: Active Interventions: Provide education on orientation to the wound center Notes: ` Pain, Acute or Chronic Nursing Diagnoses: Pain, acute or chronic: actual or potential Potential alteration in comfort, pain Goals: Patient/caregiver will verbalize adequate pain control between visits Date Initiated: 09/30/2017 Target Resolution Date: 01/16/2018 Goal Status: Active Interventions: Complete pain assessment as per visit requirements Notes: ` Wound/Skin Impairment Nursing Diagnoses: Impaired tissue integrity Knowledge deficit related to ulceration/compromised skin integrity Goals: Ulcer/skin breakdown will have a volume reduction of 80% by week 12 Date Initiated: 09/30/2017 Target Resolution Date: 01/16/2018 Goal Status: Active Interventions: Assess patient/caregiver ability to perform ulcer/skin care regimen upon admission and as needed Assess ulceration(s) every visit Notes: Electronic Signature(s) Signed: 12/14/2017 9:08:37 AM By: Gretta Cool, BSN, RN, CWS, Kim RN, BSN Entered By: Gretta Cool, BSN, RN, CWS, Kim on 12/09/2017 14:41:00 Rinella, [name] ([id number]) Romana Juniper, Shunta M. ([id number]) -------------------------------------------------------------------------------- Pain Assessment Details Patient Name: [name]. Date of Service: 12/09/2017 2:00 PM Medical Record Number: [id number] Patient Account Number: [id number] Date of Birth/Sex: 08-22-1946 (72 y.o. F) Treating RN:  Ahmed Prima Primary Care Nimra Puccinelli: Viviana Simpler Other Clinician: Referring Velora Horstman: Viviana Simpler Treating Mann Skaggs/Extender: Tito Dine in Treatment: 10 Active Problems Location of Pain Severity and Description of Pain Patient Has Paino Yes Site Locations Pain Location: Generalized Pain Rate the pain. Current Pain Level: 7 Character of Pain Describe the Pain: Aching Pain Management and Medication Current Pain Management: Notes Topical or injectable lidocaine is offered to patient for acute pain when surgical debridement is performed. If needed, Patient is instructed to use over the counter pain medication for the following 24-48 hours after debridement. Wound care MDs do not prescribed pain medications. Patient has chronic pain or uncontrolled pain. Patient has been instructed to make an appointment with their Primary Care Physician for pain management. Electronic Signature(s) Signed: 12/10/2017 4:32:30 PM By: Alric Quan Entered By: Alric Quan on 12/09/2017 14:24:03 [name] ([id number]) -------------------------------------------------------------------------------- Patient/Caregiver Education Details Patient Name: [name]. Date of Service: 12/09/2017 2:00 PM Medical Record Number: [id number] Patient Account Number: [id number] Date of Birth/Gender: 24-Feb-1946 (72 y.o. F) Treating RN: Montey Hora Primary Care Physician: Viviana Simpler Other Clinician: Referring Physician: Viviana Simpler Treating Physician/Extender: Tito Dine in Treatment: 10 Education Assessment Education Provided To: Patient Education Topics Provided Venous: Handouts: Other: leg elevation Methods: Explain/Verbal Responses: State content correctly Electronic Signature(s) Signed: 12/09/2017 4:21:37 PM By: Montey Hora Entered By: Montey Hora on 12/09/2017 16:17:53 [place], [name]  ([id number]) -------------------------------------------------------------------------------- Wound Assessment Details Patient Name: [name]. Date of Service: 12/09/2017 2:00 PM Medical Record Number: [id number] Patient Account Number: [id number] Date of Birth/Sex: [birth year] (72 y.o. F) Treating RN: Ahmed Prima Primary Care Hadassah Rana: Viviana Simpler Other Clinician: Referring Tylen Leverich: Viviana Simpler Treating Zabella Wease/Extender: Tito Dine in Treatment: 10 Wound Status Wound Number: 1 Primary Etiology: Diabetic Wound/Ulcer of the Lower Extremity Wound Location:  Right Lower Leg - Medial Secondary Trauma, Other Wounding Event: Trauma Etiology: Date Acquired: 08/30/2017 Wound Status: Open Weeks Of Treatment: 10 Comorbid Hypertension, Type II Diabetes, Clustered Wound: No History: Osteoarthritis, Neuropathy Photos Photo Uploaded By: Alric Quan on 12/09/2017 15:26:06 Wound Measurements Length: (cm) 0.3 Width: (cm) 0.2 Depth: (cm) 0.1 Area: (cm) 0.047 Volume: (cm) 0.005 % Reduction in Area: 99.4% % Reduction in Volume: 99.4% Epithelialization: Small (1-33%) Tunneling: No Undermining: No Wound Description Classification: Grade 2 Wound Margin: Distinct, outline attached Exudate Amount: Medium Exudate Type: Serosanguineous Exudate Color: red, brown Foul Odor After Cleansing: No Slough/Fibrino Yes Wound Bed Granulation Amount: Large (67-100%) Exposed Structure Granulation Quality: Red Fascia Exposed: No Necrotic Amount: Small (1-33%) Fat Layer (Subcutaneous Tissue) Exposed: Yes Necrotic Quality: Adherent Slough Tendon Exposed: No Muscle Exposed: No Joint Exposed: No Bone Exposed: No Periwound Skin Texture Donlan, Noam M. ([id number]) Texture Color No Abnormalities Noted: No No Abnormalities Noted: No Callus: No Atrophie Blanche: No Crepitus: No Cyanosis: No Excoriation: No Ecchymosis: No Induration: No Erythema: No Rash:  No Hemosiderin Staining: No Scarring: No Mottled: No Pallor: No Moisture Rubor: No No Abnormalities Noted: No Dry / Scaly: Yes Temperature / Pain Maceration: No Temperature: No Abnormality Tenderness on Palpation: Yes Wound Preparation Ulcer Cleansing: Rinsed/Irrigated with Saline Topical Anesthetic Applied: Other: lidocaine 4%, Treatment Notes Wound #1 (Right, Medial Lower Leg) 1. Cleansed with: Clean wound with Normal Saline 2. Anesthetic Topical Lidocaine 4% cream to wound bed prior to debridement 4. Dressing Applied: Prisma Ag Other dressing (specify in notes) 5. Secondary Dressing Applied ABD Pad 7. Secured with Tape Notes kerlix, coban, unna to Engineer, production) Signed: 12/10/2017 4:32:30 PM By: Alric Quan Entered By: Alric Quan on 12/09/2017 14:33:16 Mccleese, [name] ([id number]) -------------------------------------------------------------------------------- Wound Assessment Details Patient Name: [name]. Date of Service: 12/09/2017 2:00 PM Medical Record Number: [id number] Patient Account Number: [id number] Date of Birth/Sex: 03-05-1946 (72 y.o. F) Treating RN: Ahmed Prima Primary Care Burnell Matlin: Viviana Simpler Other Clinician: Referring Bijan Ridgley: Viviana Simpler Treating Kayly Kriegel/Extender: Tito Dine in Treatment: 10 Wound Status Wound Number: 2 Primary Etiology: Diabetic Wound/Ulcer of the Lower Extremity Wound Location: Left Lower Leg - Lateral Secondary Trauma, Other Wounding Event: Trauma Etiology: Date Acquired: 08/30/2017 Wound Status: Open Weeks Of Treatment: 10 Comorbid Hypertension, Type II Diabetes, Clustered Wound: No History: Osteoarthritis, Neuropathy Photos Photo Uploaded By: Alric Quan on 12/09/2017 15:26:07 Wound Measurements Length: (cm) 0.5 Width: (cm) 0.4 Depth: (cm) 0.1 Area: (cm) 0.157 Volume: (cm) 0.016 % Reduction in Area: 99.2% % Reduction in Volume:  99.9% Epithelialization: Small (1-33%) Tunneling: No Undermining: No Wound Description Classification: Grade 2 Wound Margin: Distinct, outline attached Exudate Amount: Large Exudate Type: Serosanguineous Exudate Color: red, brown Foul Odor After Cleansing: No Slough/Fibrino Yes Wound Bed Granulation Amount: None Present (0%) Exposed Structure Necrotic Amount: Large (67-100%) Fascia Exposed: No Necrotic Quality: Eschar, Adherent Slough Fat Layer (Subcutaneous Tissue) Exposed: Yes Tendon Exposed: No Muscle Exposed: No Joint Exposed: No Bone Exposed: No Periwound Skin Texture Paige, Tavon M. ([id number]) Texture Color No Abnormalities Noted: No No Abnormalities Noted: No Callus: No Atrophie Blanche: No Crepitus: No Cyanosis: No Excoriation: No Ecchymosis: No Induration: No Erythema: No Rash: No Hemosiderin Staining: No Scarring: No Mottled: No Pallor: No Moisture Rubor: No No Abnormalities Noted: No Dry / Scaly: No Temperature / Pain Maceration: No Temperature: No Abnormality Tenderness on Palpation: Yes Wound Preparation Ulcer Cleansing: Rinsed/Irrigated with Saline Topical Anesthetic Applied: Other: lidocaine 4%, Treatment Notes Wound #2 (Left, Lateral Lower Leg)  1. Cleansed with: Clean wound with Normal Saline 2. Anesthetic Topical Lidocaine 4% cream to wound bed prior to debridement 4. Dressing Applied: Prisma Ag Other dressing (specify in notes) 5. Secondary Dressing Applied ABD Pad 7. Secured with Tape Notes kerlix, coban, unna to Engineer, production) Signed: 12/10/2017 4:32:30 PM By: Alric Quan Entered By: Alric Quan on 12/09/2017 14:34:01 [name] ([id number]) -------------------------------------------------------------------------------- [place] Details Patient Name: [name]. Date of Service: 12/09/2017 2:00 PM Medical Record Number: [id number] Patient Account Number: [id number] Date of Birth/Sex:  08/18/1946 (72 y.o. F) Treating RN: Ahmed Prima Primary Care Farrie Sann: Viviana Simpler Other Clinician: Referring Ozzie Knobel: Viviana Simpler Treating Herrick Hartog/Extender: Tito Dine in Treatment: 10 Vital Signs Time Taken: 14:24 Temperature (F): 97.8 Height (in): 61 Pulse (bpm): 106 Weight (lbs): 179.1 Respiratory Rate (breaths/min): 18 Body Mass Index (BMI): 33.8 Blood Pressure (mmHg): 122/62 Reference Range: 80 - 120 mg / dl Electronic Signature(s) Signed: 12/10/2017 4:32:30 PM By: Alric Quan Entered By: Alric Quan on 12/09/2017 14:25:22

## 2017-12-14 NOTE — Progress Notes (Signed)
PINKEY, MCJUNKIN ([id number]) Visit Report for 12/09/2017 Debridement Details Patient Name: [name], [name]. Date of Service: 12/09/2017 2:00 PM Medical Record Number: [id number] Patient Account Number: [id number] Date of Birth/Sex: 11-25-1945 (71 y.o. F) Treating RN: Cornell Barman Primary Care Provider: Viviana Simpler Other Clinician: Referring Provider: Viviana Simpler Treating Provider/Extender: Tito Dine in Treatment: 10 Debridement Performed for Wound #2 Left,Lateral Lower Leg Assessment: Performed By: Physician Ricard Dillon, MD Debridement Type: Debridement Severity of Tissue Pre Fat layer exposed Debridement: Pre-procedure Verification/Time Yes - 02:42 Out Taken: Start Time: 02:42 Pain Control: Other : lidocaine 4% Total Area Debrided (L x W): 0.5 (cm) x 0.4 (cm) = 0.2 (cm) Tissue and other material Viable, Non-Viable, Slough, Subcutaneous, Slough debrided: Level: Skin/Subcutaneous Tissue Debridement Description: Excisional Instrument: Curette Bleeding: None End Time: 14:22 Procedural Pain: 1 Post Procedural Pain: 0 Response to Treatment: Procedure was tolerated well Post Debridement Measurements of Total Wound Length: (cm) 0.5 Width: (cm) 0.4 Depth: (cm) 0.1 Volume: (cm) 0.016 Character of Wound/Ulcer Post Debridement: Requires Further Debridement Severity of Tissue Post Debridement: Fat layer exposed Post Procedure Diagnosis Same as Pre-procedure Electronic Signature(s) Signed: 12/09/2017 4:49:21 PM By: Linton Ham MD Signed: 12/14/2017 9:08:37 AM By: Gretta Cool, BSN, RN, CWS, Kim RN, BSN Entered By: Linton Ham on 12/09/2017 15:17:20 [name], [name] ([id number]) -------------------------------------------------------------------------------- HPI Details Patient Name: [name]. Date of Service: 12/09/2017 2:00 PM Medical Record Number: [id number] Patient Account Number: [id number] Date of Birth/Sex: October 17, 1945 (71 y.o. F) Treating  RN: Cornell Barman Primary Care Provider: Viviana Simpler Other Clinician: Referring Provider: Viviana Simpler Treating Provider/Extender: Tito Dine in Treatment: 10 History of Present Illness HPI Description: 09/30/17; patient is a 72 year old woman with type 2 diabetes and peripheral neuropathy. On 08/30/17 she fell down some steps. She developed redness and swelling and was seen in her primary physician's office on 09/11/17. She was felt to have cellulitis and given Keflex. She will return for follow-up on 09/18/17 noted to have an 8 x 4 cm contusion on the left lateral lower leg and a 3 x 3 cm contusion on the right medial lower leg. At follow-up on 09/29/16 she had a 4 x 4 centimeter necrotic surface and she's been referred here. The patient is not on blood thinners she does not have a history of falling. She states her diabetes is under good control ABIs in this clinic could not be obtained because of pain. Patient does not have a prior wound history 10/07/17; the patient came back to the clinic last week for a nurse rewrap. Noted that the area on the right medial leg had opened as well. She is using silver alginate under compression. The substantial wound on the left anterior leg is malodorous today. I did a culture of the wound surface but no empiric antibiotics. The area on the right lateral leg is open with a small draining open area with gelatinous old blood. Using pickups and scalpel I remove necrotic surface from around this area over the entire area, pulse fairly substantially inferiorly. We've been using silver alginate to both wound areas 10/14/17; patient still has substantial drainage from these areas although they actually look some better. The area on the right lateral leg is opened up and there is depth of this. The area on the left leg is larger but more superficial. This area hasn't bridged area of skin that I thought I would have to removed however the granulation as  come up and I'm wondering whether we  can simply watch this for now these both were originally hematomas from trauma. CULTURE I did last week showed Escherichia coli and Pseudomonas both sensitive to ciprofloxacin. This was called in for her to replace the doxycycline I prescribed empirically 10/21/17; patient is making reasonably good progress in both wound areas include using silver alginate. Paradoxically the larger original wound area actually looks better. There is no evidence of infection. 10/28/17; patient continues to make excellent progress in both wound areas using silver alginate. I have been pleasantly surprised by the progress. I note ABIs of 1.25 on the right and 1.24 on the left. 11/04/17. Both the patient's wound area on the medial right calf and on the left anterior calf continue to make rapid progress. We have been using silver alginate 11/11/17; both of the patient's wounds continue to make nice progress using silver alginate. 11/18/17; the patient is doing nicely using silver alginate under compression. She is leaving this on a week 11/25/17-she is here in follow-up evaluation for bilateral lower extremity ulcerations. There is significant improvement to all areas, there is hyper granulation noted to the right lower extremity which was debrided. We will continue with same treatment plan and she will follow-up next week 12/02/17; bilateral lower extremity ulcerations which were initially traumatic. Large hematomas. She is generally done very well. We have been using silver alginate. No debridement is necessary 12/09/17; bilateral lower extremity ulcerations which were initially traumatic, large, hematomas. She has done very well I thought she might actually be closed this week. We've been using silver alginate however both areas have small openings which are still not fully epithelialized Electronic Signature(s) Signed: 12/09/2017 4:49:21 PM By: Linton Ham MD Entered By: Linton Ham on 12/09/2017 15:18:53 [name], [name] ([id number]) -------------------------------------------------------------------------------- Physical Exam Details Patient Name: [name]. Date of Service: 12/09/2017 2:00 PM Medical Record Number: [id number] Patient Account Number: [id number] Date of Birth/Sex: Oct 17, 1945 (71 y.o. F) Treating RN: Cornell Barman Primary Care Provider: Viviana Simpler Other Clinician: Referring Provider: Viviana Simpler Treating Provider/Extender: Tito Dine in Treatment: 10 Constitutional Sitting or standing Blood Pressure is within target range for patient.. Pulse regular and within target range for patient.Marland Kitchen Respirations regular, non-labored and within target range.. Temperature is normal and within the target range for the patient.Marland Kitchen appears in no distress. Notes when exam; small open areas remaining. The area on the left had surface eschar over a very small open area. This was removed with a #3 curet as well as some adherent Fairburn's debris over the wound surface. I removed this with a #3 curet hemostasis with direct pressure. The area on the right actually looks well granulated no debridement was required. Both wound areas are smaller and direction by 1-2 mm. I'm going to change the primary dressing this week to Silver collagen to see if we can stimulate closure here Electronic Signature(s) Signed: 12/09/2017 4:49:21 PM By: Linton Ham MD Entered By: Linton Ham on 12/09/2017 15:21:00 [name] ([id number]) -------------------------------------------------------------------------------- Physician Orders Details Patient Name: [name]. Date of Service: 12/09/2017 2:00 PM Medical Record Number: [id number] Patient Account Number: [id number] Date of Birth/Sex: 1946-06-02 (71 y.o. F) Treating RN: Cornell Barman Primary Care Provider: Viviana Simpler Other Clinician: Referring Provider: Viviana Simpler Treating  Provider/Extender: Tito Dine in Treatment: 10 Verbal / Phone Orders: No Diagnosis Coding Wound Cleansing Wound #1 Right,Medial Lower Leg o Clean wound with Normal Saline. o Cleanse wound with mild soap and water Wound #2 Left,Lateral Lower Leg o Clean  wound with Normal Saline. o Cleanse wound with mild soap and water Anesthetic (add to Medication List) Wound #1 Right,Medial Lower Leg o Topical Lidocaine 4% cream applied to wound bed prior to debridement (In Clinic Only). Wound #2 Left,Lateral Lower Leg o Topical Lidocaine 4% cream applied to wound bed prior to debridement (In Clinic Only). Primary Wound Dressing Wound #1 Right,Medial Lower Leg o Silver Collagen Wound #2 Left,Lateral Lower Leg o Silver Collagen Secondary Dressing Wound #1 Right,Medial Lower Leg o ABD pad Wound #2 Left,Lateral Lower Leg o ABD pad Dressing Change Frequency Wound #1 Right,Medial Lower Leg o Change dressing every week Wound #2 Left,Lateral Lower Leg o Change dressing every week Follow-up Appointments Wound #1 Right,Medial Lower Leg o Return Appointment in 1 week. o Nurse Visit as needed Wound #2 Left,Lateral Lower Leg RAINA, SOLE. ([id number]) o Return Appointment in 1 week. o Nurse Visit as needed Edema Control Wound #1 Right,Medial Lower Leg o Kerlix and Coban - Bilateral - unna to anchor o Elevate legs to the level of the heart and pump ankles as often as possible Wound #2 Left,Lateral Lower Leg o Kerlix and Coban - Bilateral - unna to anchor o Elevate legs to the level of the heart and pump ankles as often as possible Additional Orders / Instructions Wound #1 Right,Medial Lower Leg o Increase protein intake. o Other: - Vitamin C, Zinc Wound #2 Left,Lateral Lower Leg o Increase protein intake. o Other: - Vitamin C, Zinc Electronic Signature(s) Signed: 12/09/2017 4:49:21 PM By: Linton Ham MD Signed: 12/14/2017  9:08:37 AM By: Gretta Cool, BSN, RN, CWS, Kim RN, BSN Entered By: Gretta Cool, BSN, RN, CWS, Kim on 12/09/2017 14:46:09 [name], [name] ([id number]) -------------------------------------------------------------------------------- Problem List Details Patient Name: [name], [name]. Date of Service: 12/09/2017 2:00 PM Medical Record Number: [id number] Patient Account Number: [id number] Date of Birth/Sex: 08/26/1946 (71 y.o. 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 09/30/2017 Yes L97.218 Non-pressure chronic ulcer of right calf with other specified 09/30/2017 Yes severity E11.40 Type 2 diabetes mellitus with diabetic neuropathy, 09/30/2017 Yes unspecified I87.331 Chronic venous hypertension (idiopathic) with ulcer and 10/07/2017 Yes inflammation of right lower extremity Inactive Problems Resolved Problems Electronic Signature(s) Signed: 12/09/2017 4:49:21 PM By: Linton Ham MD Entered By: Linton Ham on 12/09/2017 15:13:28 [name], [name] ([id number]) -------------------------------------------------------------------------------- Progress Note Details Patient Name: [name]. Date of Service: 12/09/2017 2:00 PM Medical Record Number: [id number] Patient Account Number: [id number] Date of Birth/Sex: 03-Mar-1946 (71 y.o. F) Treating RN: Cornell Barman Primary Care Provider: Viviana Simpler Other Clinician: Referring Provider: Viviana Simpler Treating Provider/Extender: Tito Dine in Treatment: 10 Subjective History of Present Illness (HPI) 09/30/17; patient is a 72 year old woman with type 2 diabetes and peripheral neuropathy. On 08/30/17 she fell down some steps. She  developed redness and swelling and was seen in her primary physician's office on 09/11/17. She was felt to have cellulitis and given Keflex. She will return for follow-up on 09/18/17 noted to have an 8 x 4 cm contusion on the left lateral lower leg and a 3 x 3 cm contusion on the right medial lower leg. At follow-up on 09/29/16 she had a 4 x 4 centimeter necrotic surface and she's been referred here. The patient is not on blood thinners she does  not have a history of falling. She states her diabetes is under good control ABIs in this clinic could not be obtained because of pain. Patient does not have a prior wound history 10/07/17; the patient came back to the clinic last week for a nurse rewrap. Noted that the area on the right medial leg had opened as well. She is using silver alginate under compression. The substantial wound on the left anterior leg is malodorous today. I did a culture of the wound surface but no empiric antibiotics. The area on the right lateral leg is open with a small draining open area with gelatinous old blood. Using pickups and scalpel I remove necrotic surface from around this area over the entire area, pulse fairly substantially inferiorly. We've been using silver alginate to both wound areas 10/14/17; patient still has substantial drainage from these areas although they actually look some better. The area on the right lateral leg is opened up and there is depth of this. The area on the left leg is larger but more superficial. This area hasn't bridged area of skin that I thought I would have to removed however the granulation as come up and I'm wondering whether we can simply watch this for now these both were originally hematomas from trauma. CULTURE I did last week showed Escherichia coli and Pseudomonas both sensitive to ciprofloxacin. This was called in for her to replace the doxycycline I prescribed empirically 10/21/17; patient is making reasonably good progress in both  wound areas include using silver alginate. Paradoxically the larger original wound area actually looks better. There is no evidence of infection. 10/28/17; patient continues to make excellent progress in both wound areas using silver alginate. I have been pleasantly surprised by the progress. I note ABIs of 1.25 on the right and 1.24 on the left. 11/04/17. Both the patient's wound area on the medial right calf and on the left anterior calf continue to make rapid progress. We have been using silver alginate 11/11/17; both of the patient's wounds continue to make nice progress using silver alginate. 11/18/17; the patient is doing nicely using silver alginate under compression. She is leaving this on a week 11/25/17-she is here in follow-up evaluation for bilateral lower extremity ulcerations. There is significant improvement to all areas, there is hyper granulation noted to the right lower extremity which was debrided. We will continue with same treatment plan and she will follow-up next week 12/02/17; bilateral lower extremity ulcerations which were initially traumatic. Large hematomas. She is generally done very well. We have been using silver alginate. No debridement is necessary 12/09/17; bilateral lower extremity ulcerations which were initially traumatic, large, hematomas. She has done very well I thought she might actually be closed this week. We've been using silver alginate however both areas have small openings which are still not fully epithelialized [name], [name] ([id number]) Objective Constitutional Sitting or standing Blood Pressure is within target range for patient.. Pulse regular and within target range for patient.Marland Kitchen Respirations regular, non-labored and within target range.. Temperature is normal and within the target range for the patient.Marland Kitchen appears in no distress. Vitals Time Taken: 2:24 PM, Height: 61 in, Weight: 179.1 lbs, BMI: 33.8, Temperature: 97.8 F, Pulse: 106 bpm,  Respiratory Rate: 18 breaths/min, Blood Pressure: 122/62 mmHg. General Notes: when exam; small open areas remaining. The area on the left had surface eschar over a very small open area. This was removed with a #3 curet as well as some adherent Fairburn's debris over the wound surface.  I removed this with a #3 curet hemostasis with direct pressure. The area on the right actually looks well granulated no debridement was required. Both wound areas are smaller and direction by 1-2 mm. I'm going to change the primary dressing this week to Silver collagen to see if we can stimulate closure here Integumentary (Hair, Skin) Wound #1 status is Open. Original cause of wound was Trauma. The wound is located on the Right,Medial Lower Leg. The wound measures 0.3cm length x 0.2cm width x 0.1cm depth; 0.047cm^2 area and 0.005cm^3 volume. There is Fat Layer (Subcutaneous Tissue) Exposed exposed. There is no tunneling or undermining noted. There is a medium amount of serosanguineous drainage noted. The wound margin is distinct with the outline attached to the wound base. There is large (67-100%) red granulation within the wound bed. There is a small (1-33%) amount of necrotic tissue within the wound bed including Adherent Slough. The periwound skin appearance exhibited: Dry/Scaly. The periwound skin appearance did not exhibit: Callus, Crepitus, Excoriation, Induration, Rash, Scarring, Maceration, Atrophie Blanche, Cyanosis, Ecchymosis, Hemosiderin Staining, Mottled, Pallor, Rubor, Erythema. Periwound temperature was noted as No Abnormality. The periwound has tenderness on palpation. Wound #2 status is Open. Original cause of wound was Trauma. The wound is located on the Left,Lateral Lower Leg. The wound measures 0.5cm length x 0.4cm width x 0.1cm depth; 0.157cm^2 area and 0.016cm^3 volume. There is Fat Layer (Subcutaneous Tissue) Exposed exposed. There is no tunneling or undermining noted. There is a large amount  of serosanguineous drainage noted. The wound margin is distinct with the outline attached to the wound base. There is no granulation within the wound bed. There is a large (67-100%) amount of necrotic tissue within the wound bed including Eschar and Adherent Slough. The periwound skin appearance did not exhibit: Callus, Crepitus, Excoriation, Induration, Rash, Scarring, Dry/Scaly, Maceration, Atrophie Blanche, Cyanosis, Ecchymosis, Hemosiderin Staining, Mottled, Pallor, Rubor, Erythema. Periwound temperature was noted as No Abnormality. The periwound has tenderness on palpation. 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 ([id number]) Wound #2 Pre-procedure diagnosis of Wound #2 is a Diabetic Wound/Ulcer of the Lower Extremity located on the Left,Lateral Lower Leg .Severity of Tissue Pre Debridement is: Fat layer exposed. There was a Excisional Skin/Subcutaneous Tissue Debridement with a total area of 0.2 sq cm performed by Ricard Dillon, MD. With the following instrument(s): Curette. to remove Viable and Non-Viable tissue/material Material removed includes Subcutaneous Tissue, and [place] after achieving pain control using Other (lidocaine 4%). No specimens were taken. A time out was conducted at 02:42, prior to the start of the procedure. There was no bleeding. The procedure was tolerated well with a pain level of 1 throughout and a pain level of 0 following the procedure. Post Debridement Measurements: 0.5cm length x 0.4cm width x 0.1cm depth; 0.016cm^3 volume. Character of Wound/Ulcer Post Debridement requires further debridement.  Severity of Tissue Post Debridement is: Fat layer exposed. Post procedure Diagnosis Wound #2: Same as Pre-Procedure Plan Wound Cleansing: Wound #1 Right,Medial Lower Leg: Clean wound with Normal Saline. Cleanse wound with mild soap and water Wound #2 Left,Lateral Lower Leg: Clean wound with Normal Saline. Cleanse wound with mild soap and water Anesthetic (add to Medication List): Wound #1 Right,Medial  Lower Leg: Topical Lidocaine 4% cream applied to wound bed prior to debridement (In Clinic Only). Wound #2 Left,Lateral Lower Leg: Topical Lidocaine 4% cream applied to wound bed prior to debridement (In Clinic Only). Primary Wound Dressing: Wound #1 Right,Medial Lower Leg: Silver Collagen Wound #2 Left,Lateral Lower Leg: Silver Collagen Secondary Dressing: Wound #1 Right,Medial Lower Leg: ABD pad Wound #2 Left,Lateral Lower Leg: ABD pad Dressing Change Frequency: Wound #1 Right,Medial Lower Leg: Change dressing every week Wound #2 Left,Lateral Lower Leg: Change dressing every week Follow-up Appointments: Wound #1 Right,Medial Lower Leg: Return Appointment in 1 week. Nurse Visit as needed Wound #2 Left,Lateral Lower Leg: Return Appointment in 1 week. Nurse Visit as needed Edema Control: Wound #1 Right,Medial Lower Leg: Kerlix and Coban - Bilateral - unna to anchor Elevate legs to the level of the heart and pump ankles as often as possible [name], [name] ([id number]) Wound #2 Left,Lateral Lower Leg: Kerlix and Coban - Bilateral - unna to anchor Elevate legs to the level of the heart and pump ankles as often as possible Additional Orders / Instructions: Wound #1 Right,Medial Lower Leg: Increase protein intake. Other: - Vitamin C, Zinc Wound #2 Left,Lateral Lower Leg: Increase protein intake. Other: - Vitamin C, Zinc #1 change the primary dressing to Silver collagen I'm hopeful to stimulate closure here. 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 Date of Service: 12/09/2017 Medical Record Number: [id number] Patient Account Number: [id number] Date of Birth/Sex: 1946/07/06 (72 y.o. F) Treating RN: Cornell Barman Primary Care Provider: Viviana Simpler Other Clinician: Referring Provider: Viviana Simpler Treating Provider/Extender: Tito Dine in Treatment: 10 Diagnosis Coding ICD-10 Codes Code Description [phone] 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e L97.218 Non-pressure chronic ulcer of right calf with other specified severity E11.40 Type 2 diabetes mellitus with diabetic neuropathy, unspecified I87.331 Chronic venous hypertension (idiopathic) with ulcer and inflammation of right lower extremity Facility Procedures CPT4 Code: [id number] Description: 83419 - DEB SUBQ TISSUE 20 SQ CM/< ICD-10 Diagnosis Description L97.223 Non-pressure chronic ulcer of left calf with necrosis of mu Modifier: scle Quantity: 1 Physician Procedures CPT4 Code: [id number] Description: 89211 - WC PHYS SUBQ TISS 20 SQ CM ICD-10 Diagnosis Description L97.223 Non-pressure chronic ulcer of left calf with necrosis of mu Modifier: scle Quantity: 1 Electronic Signature(s) Signed: 12/09/2017 4:49:21 PM By: Linton Ham MD Entered By: Linton Ham on 12/09/2017 15:32:33

## 2017-12-14 NOTE — Telephone Encounter (Signed)
Sent 90 days supply

## 2017-12-14 NOTE — Telephone Encounter (Signed)
recevied a fax requesting a 90 day supply instead of a 30 day supply for the duloxetine and bupropion hcl

## 2017-12-16 ENCOUNTER — Encounter: Payer: Medicare HMO | Admitting: Internal Medicine

## 2017-12-16 DIAGNOSIS — L97212 Non-pressure chronic ulcer of right calf with fat layer exposed: Secondary | ICD-10-CM | POA: Diagnosis not present

## 2017-12-16 DIAGNOSIS — E11622 Type 2 diabetes mellitus with other skin ulcer: Secondary | ICD-10-CM | POA: Diagnosis not present

## 2017-12-16 DIAGNOSIS — L97812 Non-pressure chronic ulcer of other part of right lower leg with fat layer exposed: Secondary | ICD-10-CM | POA: Diagnosis not present

## 2017-12-16 DIAGNOSIS — E1151 Type 2 diabetes mellitus with diabetic peripheral angiopathy without gangrene: Secondary | ICD-10-CM | POA: Diagnosis not present

## 2017-12-16 DIAGNOSIS — I87311 Chronic venous hypertension (idiopathic) with ulcer of right lower extremity: Secondary | ICD-10-CM | POA: Diagnosis not present

## 2017-12-16 DIAGNOSIS — L97822 Non-pressure chronic ulcer of other part of left lower leg with fat layer exposed: Secondary | ICD-10-CM | POA: Diagnosis not present

## 2017-12-17 ENCOUNTER — Ambulatory Visit (INDEPENDENT_AMBULATORY_CARE_PROVIDER_SITE_OTHER): Payer: Medicare HMO | Admitting: Orthopaedic Surgery

## 2017-12-17 ENCOUNTER — Encounter (INDEPENDENT_AMBULATORY_CARE_PROVIDER_SITE_OTHER): Payer: Self-pay | Admitting: Orthopaedic Surgery

## 2017-12-17 ENCOUNTER — Ambulatory Visit (INDEPENDENT_AMBULATORY_CARE_PROVIDER_SITE_OTHER): Payer: Medicare HMO

## 2017-12-17 DIAGNOSIS — M25552 Pain in left hip: Secondary | ICD-10-CM | POA: Diagnosis not present

## 2017-12-17 DIAGNOSIS — M1611 Unilateral primary osteoarthritis, right hip: Secondary | ICD-10-CM | POA: Diagnosis not present

## 2017-12-17 NOTE — Progress Notes (Signed)
Office Visit Note   Patient: [name]           Date of Birth: Aug 24, 1946           MRN: [id number] Visit Date: 12/17/2017              Requested by: Venia Carbon, MD [place], [place] [address] PCP: Venia Carbon, MD   Assessment & Plan: Visit Diagnoses:  1. Pain in left hip   2. Unilateral primary osteoarthritis, right hip     Plan: At this point given the severity of arthritic changes with a rapidly progressed over 2 years we are recommending a left total hip arthroplasty through direct anterior approach.  We went over her x-rays in detail and I showed her hip model explained in detail what the surgery involves.  We had a thorough discussion about the risk and benefits of surgery as well as her intraoperative and postoperative course.  She has no assistance from family at home.  After she had extensive lumbar spine surgery she had to get his she will need this after the surgery as well.  She understands this fully.  All questions concerns were answered and addressed.  We will work on getting his scheduled in the near future.  Follow-Up Instructions: Return for 2 weeks post-op.   Orders:  Orders Placed This Encounter  Procedures  . XR HIP UNILAT W OR W/O PELVIS 2-3 VIEWS LEFT   No orders of the defined types were placed in this encounter.     Procedures: No procedures performed   Clinical Data: No additional findings.   Subjective: Chief Complaint  Patient presents with  . Left Hip - Pain  Patient comes in today complaining of severe debilitating left hip pain is been getting worse for over a year now.  She says is a chronic constant pain.  She endplates with a rolling walker.  The pain is daily.  It is 10 out of 10.  Pain is detrimentally affecting her active daily living, her quality of life, and her mobility.  She does take gabapentin as well as Tylenol and tramadol.  She reports pain in her groin.  She has had extensive lumbar spine  surgery.  She is a diabetic but a hemoglobin A1c of below 6.  She is tried and failed all forms of conservative treatment.  I have been able to see x-rays from 2017 until today and have seen progressively worse arthritis of her left hip.  HPI  Review of Systems He currently denies any headache, chest pain, short of breath, fever, chills, nausea, vomiting.  Objective: Vital Signs: There were no vitals taken for this visit.  Physical Exam She is alert and oriented x3 and in no acute distress Ortho Exam Examination of her left hip shows severe pain with attempts of internal and external rotation.  Examination of her right hip is entirely normal. Specialty Comments:  No specialty comments available.  Imaging: Xr Hip Unilat W Or W/o Pelvis 2-3 Views Left  Result Date: 12/17/2017 AP pelvis and lateral left hip shows severe end-stage arthritis of left hip.  There is complete loss of the left hip joint space.  There is slight collapse of the femoral head.  There are sclerotic changes in cystic changes as well.  Her right hip appears normal.    PMFS History: Patient Active Problem List   Diagnosis Date Noted  . Left hip pain 12/09/2017  . Leg ulcer (  [place]) 09/29/2017  . Sore throat 09/29/2017  . Contusion of left lower leg 09/18/2017  . Pain syndrome, chronic 03/13/2017  . Facet arthropathy, lumbar 03/13/2017  . Advance directive discussed with patient 01/15/2016  . Spinal stenosis of lumbar region with neurogenic claudication 11/07/2015  . Other intervertebral disc degeneration, lumbar region 06/01/2015  . Chronic back pain 03/29/2015  . Preventative health care 01/10/2015  . Osteoarthritis, hip, bilateral 11/28/2014  . Peripheral neuropathy 11/28/2014  . Type 2 diabetes, controlled, with neuropathy ([place]) 01/20/2014  . Episodic mood disorder ([place]) 01/20/2014  . IBS (irritable bowel syndrome) 12/27/2012  . Essential hypertension, benign 08/06/2010   Past Medical History:  Diagnosis  Date  . Arthritis   . Confusion   . Diabetes mellitus without complication ([place])   . Hepatitis   . Hypertension   . Lumbar stenosis   . Memory loss   . Nocturia   . Numbness and tingling    legs and feet  . Wears glasses     Family History  Problem Relation Age of Onset  . Hypertension Other   . Cancer Other        ovarian  . Cancer Mother   . Heart disease Father     Past Surgical History:  Procedure Laterality Date  . [place]  . COLONOSCOPY    . HEMORRHOID SURGERY  2015  . TRANSFORAMINAL LUMBAR INTERBODY FUSION (TLIF) WITH PEDICLE SCREW FIXATION 2 LEVEL N/A 11/07/2015   Procedure: Lumbar three-four Lumbar four-five  transforaminal lumbar interbody fusion with interbody prosthesis posterior lateral arthrodesis and posterior segmental instrumentation;  Surgeon: Newman Pies, MD;  Location: [facility];  Service: Neurosurgery;  Laterality: N/A;   Social History   Occupational History  . Not on file  Tobacco Use  . Smoking status: Never Smoker  . Smokeless tobacco: Never Used  Substance and Sexual Activity  . Alcohol use: No    Comment: rarely  . Drug use: No  . Sexual activity: Not Currently

## 2017-12-18 ENCOUNTER — Encounter: Payer: Self-pay | Admitting: Internal Medicine

## 2017-12-18 ENCOUNTER — Encounter (INDEPENDENT_AMBULATORY_CARE_PROVIDER_SITE_OTHER): Payer: Self-pay | Admitting: Orthopaedic Surgery

## 2017-12-18 ENCOUNTER — Other Ambulatory Visit (INDEPENDENT_AMBULATORY_CARE_PROVIDER_SITE_OTHER): Payer: Self-pay | Admitting: Orthopaedic Surgery

## 2017-12-18 MED ORDER — ACETAMINOPHEN-CODEINE #3 300-30 MG PO TABS: 1.0000 | ORAL_TABLET | Freq: Three times a day (TID) | ORAL | 0 refills | Status: DC | PRN

## 2017-12-19 MED ORDER — TRAMADOL HCL 50 MG PO TABS: 50.0000 mg | ORAL_TABLET | Freq: Three times a day (TID) | ORAL | 0 refills | Status: DC | PRN

## 2017-12-20 NOTE — Progress Notes (Signed)
[name] ([id number]) Visit Report for 12/16/2017 Arrival Information Details Patient Name: [name], [name]. Date of Service: 12/16/2017 3:30 PM Medical Record Number: [id number] Patient Account Number: [id number] Date of Birth/Sex: 05/11/1946 (72 y.o. F) Treating RN: Roger Shelter Primary Care Julina Altmann: Viviana Simpler Other Clinician: Referring Martasia Talamante: Viviana Simpler Treating Tayten Bergdoll/Extender: Tito Dine in Treatment: 11 Visit Information History Since Last Visit All ordered tests and consults were completed: No Patient Arrived: Gilford Rile Added or deleted any medications: No Arrival Time: 15:45 Any new allergies or adverse reactions: No Accompanied By: self Had a fall or experienced change in No Transfer Assistance: None activities of daily living that may affect Patient Identification Verified: Yes risk of falls: Secondary Verification Process Completed: Yes Signs or symptoms of abuse/neglect since last visito No Patient Requires Transmission-Based Precautions: No Hospitalized since last visit: No Patient Has Alerts: Yes Implantable device outside of the clinic excluding No Patient Alerts: DM II cellular tissue based products placed in the center since last visit: Pain Present Now: No Electronic Signature(s) Signed: 12/18/2017 4:04:12 PM By: Roger Shelter Entered By: Roger Shelter on 12/16/2017 15:46:16 On, [name] ([id number]) -------------------------------------------------------------------------------- Clinic Level of Care Assessment Details Patient Name: [name]. Date of Service: 12/16/2017 3:30 PM Medical Record Number: [id number] Patient Account Number: [id number] Date of Birth/Sex: [birth year] (72 y.o. F) Treating RN: Roger Shelter Primary Care Zilda No: Viviana Simpler Other Clinician: Referring Haydan Wedig: Viviana Simpler Treating Charma Mocarski/Extender: Tito Dine in Treatment: 11 Clinic Level of Care  Assessment Items TOOL 4 Quantity Score []  - Use when only an EandM is performed on FOLLOW-UP visit 0 ASSESSMENTS - Nursing Assessment / Reassessment []  - Reassessment of Co-morbidities (includes updates in patient status) 0 X- 1 5 Reassessment of Adherence to Treatment Plan ASSESSMENTS - Wound and Skin Assessment / Reassessment X - Simple Wound Assessment / Reassessment - one wound 1 5 []  - 0 Complex Wound Assessment / Reassessment - multiple wounds []  - 0 Dermatologic / Skin Assessment (not related to wound area) ASSESSMENTS - Focused Assessment []  - Circumferential Edema Measurements - multi extremities 0 []  - 0 Nutritional Assessment / Counseling / Intervention []  - 0 Lower Extremity Assessment (monofilament, tuning fork, pulses) []  - 0 Peripheral Arterial Disease Assessment (using hand held doppler) ASSESSMENTS - Ostomy and/or Continence Assessment and Care []  - Incontinence Assessment and Management 0 []  - 0 Ostomy Care Assessment and Management (repouching, etc.) PROCESS - Coordination of Care X - Simple Patient / Family Education for ongoing care 1 15 []  - 0 Complex (extensive) Patient / Family Education for ongoing care []  - 0 Staff obtains Programmer, systems, Records, Test Results / Process Orders []  - 0 Staff telephones HHA, Nursing Homes / Clarify orders / etc []  - 0 Routine Transfer to another Facility (non-emergent condition) []  - 0 Routine Hospital Admission (non-emergent condition) []  - 0 New Admissions / Biomedical engineer / Ordering NPWT, Apligraf, etc. []  - 0 Emergency Hospital Admission (emergent condition) X- 1 10 Simple Discharge Coordination ALEEHA, BOLINE. ([id number]) []  - 0 Complex (extensive) Discharge Coordination PROCESS - Special Needs []  - Pediatric / Minor Patient Management 0 []  - 0 Isolation Patient Management []  - 0 Hearing / Language / Visual special needs []  - 0 Assessment of Community assistance (transportation, D/C planning,  etc.) []  - 0 Additional assistance / Altered mentation []  - 0 Support Surface(s) Assessment (bed, cushion, seat, etc.) INTERVENTIONS - Wound Cleansing / Measurement X - Simple Wound Cleansing - one wound 1 5 []  -  0 Complex Wound Cleansing - multiple wounds X- 1 5 Wound Imaging (photographs - any number of wounds) []  - 0 Wound Tracing (instead of photographs) X- 1 5 Simple Wound Measurement - one wound []  - 0 Complex Wound Measurement - multiple wounds INTERVENTIONS - Wound Dressings []  - Small Wound Dressing one or multiple wounds 0 X- 1 15 Medium Wound Dressing one or multiple wounds []  - 0 Large Wound Dressing one or multiple wounds []  - 0 Application of Medications - topical []  - 0 Application of Medications - injection INTERVENTIONS - Miscellaneous []  - External ear exam 0 []  - 0 Specimen Collection (cultures, biopsies, blood, body fluids, etc.) []  - 0 Specimen(s) / Culture(s) sent or taken to Lab for analysis []  - 0 Patient Transfer (multiple staff / Civil Service fast streamer / Similar devices) []  - 0 Simple Staple / Suture removal (25 or less) []  - 0 Complex Staple / Suture removal (26 or more) []  - 0 Hypo / Hyperglycemic Management (close monitor of Blood Glucose) []  - 0 Ankle / Brachial Index (ABI) - do not check if billed separately X- 1 5 Vital Signs Stolarz, Karinne M. ([id number]) Has the patient been seen at the hospital within the last three years: Yes Total Score: 70 Level Of Care: New/Established - Level 2 Electronic Signature(s) Signed: 12/18/2017 4:04:12 PM By: Roger Shelter Entered By: Roger Shelter on 12/16/2017 16:05:27 [name] ([id number]) -------------------------------------------------------------------------------- Encounter Discharge Information Details Patient Name: [name]. Date of Service: 12/16/2017 3:30 PM Medical Record Number: [id number] Patient Account Number: [id number] Date of Birth/Sex: 1945/10/31 (72 y.o. F) Treating  RN: Cornell Barman Primary Care Juandavid Dallman: Viviana Simpler Other Clinician: Referring Raney Koeppen: Viviana Simpler Treating Isael Stille/Extender: Tito Dine in Treatment: 11 Encounter Discharge Information Items Discharge Pain Level: 0 Discharge Condition: Stable Ambulatory Status: Walker Discharge Destination: Home Transportation: Private Auto Accompanied By: self Schedule Follow-up Appointment: Yes Medication Reconciliation completed and No provided to Patient/Care Mikeila Burgen: Provided on Clinical Summary of Care: 12/16/2017 Form Type Recipient Paper Patient LK Electronic Signature(s) Signed: 12/18/2017 4:04:12 PM By: Roger Shelter Entered By: Roger Shelter on 12/16/2017 16:17:02 Guedes, [name] ([id number]) -------------------------------------------------------------------------------- Lower Extremity Assessment Details Patient Name: [name]. Date of Service: 12/16/2017 3:30 PM Medical Record Number: [id number] Patient Account Number: [id number] Date of Birth/Sex: [birth year] (72 y.o. F) Treating RN: Roger Shelter Primary Care Adara Kittle: Viviana Simpler Other Clinician: Referring Santonio Speakman: Viviana Simpler Treating Lilyahna Sirmon/Extender: Tito Dine in Treatment: 11 Edema Assessment Assessed: [Left: No] [Right: No] Edema: [Left: No] [Right: No] Vascular Assessment Claudication: Claudication Assessment [Left:None] [Right:None] Pulses: Dorsalis Pedis Palpable: [Left:Yes] [Right:Yes] Posterior Tibial Extremity colors, hair growth, and conditions: Extremity Color: [Left:Normal] [Right:Normal] Hair Growth on Extremity: [Left:No] [Right:No] Temperature of Extremity: [Left:Warm] Capillary Refill: [Left:< 3 seconds] [Right:< 3 seconds] Toe Nail Assessment Left: Right: Thick: No No Discolored: No No Deformed: No No Improper Length and Hygiene: No No Electronic Signature(s) Signed: 12/18/2017 4:04:12 PM By: Roger Shelter Entered By: Roger Shelter on 12/17/2017 11:34:55 Mireles, [name] ([id number]) -------------------------------------------------------------------------------- Multi Wound Chart Details Patient Name: [name]. Date of Service: 12/16/2017 3:30 PM Medical Record Number: [id number] Patient Account Number: [id number] Date of Birth/Sex: Jun 20, 1946 (72 y.o. F) Treating RN: Roger Shelter Primary Care Becca Bayne: Viviana Simpler Other Clinician: Referring Ishmael Berkovich: Viviana Simpler Treating Marshell Rieger/Extender: Tito Dine in Treatment: 11 Photos: [1:No Photos] [2:No Photos] [N/A:N/A] Wound Location: [1:Right Lower Leg - Medial] [2:Left, Lateral Lower Leg] [N/A:N/A] Wounding Event: [1:Trauma] [2:Trauma] [N/A:N/A] Primary Etiology: [1:Diabetic Wound/Ulcer  of the Lower Extremity] [2:Diabetic Wound/Ulcer of the Lower Extremity] [N/A:N/A] Secondary Etiology: [1:Trauma, Other] [2:Trauma, Other] [N/A:N/A] Comorbid History: [1:Hypertension, Type II Diabetes, Osteoarthritis, Neuropathy] [2:Hypertension, Type II Diabetes, Osteoarthritis, Neuropathy] [N/A:N/A] Date Acquired: [1:08/30/2017] [2:08/30/2017] [N/A:N/A] Weeks of Treatment: [1:11] [2:11] [N/A:N/A] Wound Status: [1:Open] [2:Healed - Epithelialized] [N/A:N/A] Measurements L x W x D [1:0.2x0.2x0.1] [2:0x0x0] [N/A:N/A] (cm) Area (cm) : [1:0.031] [2:0] [N/A:N/A] Volume (cm) : [1:0.003] [2:0] [N/A:N/A] % Reduction in Area: [1:99.60%] [2:100.00%] [N/A:N/A] % Reduction in Volume: [1:99.60%] [2:100.00%] [N/A:N/A] Classification: [1:Grade 2] [2:Grade 2] [N/A:N/A] Exudate Amount: [1:None Present] [2:None Present] [N/A:N/A] Wound Margin: [1:Distinct, outline attached] [2:Distinct, outline attached] [N/A:N/A] Granulation Amount: [1:Large (67-100%)] [2:Large (67-100%)] [N/A:N/A] Granulation Quality: [1:Red] [2:N/A] [N/A:N/A] Necrotic Amount: [1:Small (1-33%)] [2:None Present (0%)] [N/A:N/A] Necrotic Tissue: [1:Eschar] [2:N/A] [N/A:N/A] Exposed  Structures: [1:Fat Layer (Subcutaneous Tissue) Exposed: Yes Fascia: No Tendon: No Muscle: No Joint: No Bone: No] [2:Fat Layer (Subcutaneous Tissue) Exposed: Yes Fascia: No Tendon: No Muscle: No Joint: No Bone: No] [N/A:N/A] Epithelialization: [1:Large (67-100%)] [2:Large (67-100%)] [N/A:N/A] Periwound Skin Texture: [1:Excoriation: No Induration: No Callus: No Crepitus: No Rash: No Scarring: No] [2:Excoriation: No Induration: No Callus: No Crepitus: No Rash: No Scarring: No] [N/A:N/A] Periwound Skin Moisture: [1:Dry/Scaly: Yes Maceration: No] [2:Maceration: No Dry/Scaly: No] [N/A:N/A] Periwound Skin Color: [1:Atrophie Blanche: No Cyanosis: No Ecchymosis: No Erythema: No Hemosiderin Staining: No] [2:Atrophie Blanche: No Cyanosis: No Ecchymosis: No Erythema: No Hemosiderin Staining: No] [N/A:N/A] Mottled: No Mottled: No Pallor: No Pallor: No Rubor: No Rubor: No Temperature: No Abnormality No Abnormality N/A Tenderness on Palpation: Yes Yes N/A Wound Preparation: Ulcer Cleansing: Ulcer Cleansing: N/A Rinsed/Irrigated with Saline Rinsed/Irrigated with Saline Topical Anesthetic Applied: Topical Anesthetic Applied: Other: lidocaine 4% Other: lidocaine 4% Treatment Notes Wound #1 (Right, Medial Lower Leg) 1. Cleansed with: Clean wound with Normal Saline 2. Anesthetic Topical Lidocaine 4% cream to wound bed prior to debridement 4. Dressing Applied: Prisma Ag Notes kerlix, coban, Electronic Signature(s) Signed: 12/16/2017 5:02:04 PM By: Linton Ham MD Entered By: Linton Ham on 12/16/2017 16:19:00 [name] ([id number]) -------------------------------------------------------------------------------- [place] Details Patient Name: [name]. Date of Service: 12/16/2017 3:30 PM Medical Record Number: [id number] Patient Account Number: [id number] Date of Birth/Sex: March 24, 1946 (72 y.o. F) Treating RN: Roger Shelter Primary Care Xaine Sansom: Viviana Simpler Other Clinician: Referring Javin Nong: Viviana Simpler Treating Cayle Cordoba/Extender: Tito Dine in Treatment: 11 Active Inactive ` Abuse / Safety / Falls / Self Care Management Nursing Diagnoses: History of Falls Potential for falls Goals: Patient will not experience any injury related to falls Date Initiated: 09/30/2017 Target Resolution Date: 01/16/2018 Goal Status: Active Interventions: Assess Activities of Daily Living upon admission and as needed Assess fall risk on admission and as needed Assess: immobility, friction, shearing, incontinence upon admission and as needed Assess impairment of mobility on admission and as needed per policy Notes: ` Nutrition Nursing Diagnoses: Imbalanced nutrition Impaired glucose control: actual or potential Potential for alteratiion in Nutrition/Potential for imbalanced nutrition Goals: Patient/caregiver agrees to and verbalizes understanding of need to use nutritional supplements and/or vitamins as prescribed Date Initiated: 09/30/2017 Target Resolution Date: 01/16/2018 Goal Status: Active Patient/caregiver will maintain therapeutic glucose control Date Initiated: 09/30/2017 Target Resolution Date: 01/16/2018 Goal Status: Active Interventions: Assess patient nutrition upon admission and as needed per policy Provide education on elevated blood sugars and impact on wound healing Notes: LAURETTE, VILLESCAS ([id number]) Orientation to the Wound Care Program Nursing Diagnoses: Knowledge deficit related to the wound healing center program Goals: Patient/caregiver will verbalize understanding  of the [place] Program Date Initiated: 09/30/2017 Target Resolution Date: 10/17/2017 Goal Status: Active Interventions: Provide education on orientation to the wound center Notes: ` Pain, Acute or Chronic Nursing Diagnoses: Pain, acute or chronic: actual or potential Potential alteration in comfort,  pain Goals: Patient/caregiver will verbalize adequate pain control between visits Date Initiated: 09/30/2017 Target Resolution Date: 01/16/2018 Goal Status: Active Interventions: Complete pain assessment as per visit requirements Notes: ` Wound/Skin Impairment Nursing Diagnoses: Impaired tissue integrity Knowledge deficit related to ulceration/compromised skin integrity Goals: Ulcer/skin breakdown will have a volume reduction of 80% by week 12 Date Initiated: 09/30/2017 Target Resolution Date: 01/16/2018 Goal Status: Active Interventions: Assess patient/caregiver ability to perform ulcer/skin care regimen upon admission and as needed Assess ulceration(s) every visit Notes: Electronic Signature(s) Signed: 12/18/2017 4:04:12 PM By: Roger Shelter Entered By: Roger Shelter on 12/16/2017 16:02:56 Wintle, [name] ([id number]) Romana Juniper, Canyon M. ([id number]) -------------------------------------------------------------------------------- Pain Assessment Details Patient Name: [name]. Date of Service: 12/16/2017 3:30 PM Medical Record Number: [id number] Patient Account Number: [id number] Date of Birth/Sex: 03-Jan-1946 (72 y.o. F) Treating RN: Roger Shelter Primary Care Jaymi Tinner: Viviana Simpler Other Clinician: Referring Ashelynn Marks: Viviana Simpler Treating Javian Nudd/Extender: Tito Dine in Treatment: 11 Active Problems Location of Pain Severity and Description of Pain Patient Has Paino No Site Locations Pain Management and Medication Current Pain Management: Electronic Signature(s) Signed: 12/18/2017 4:04:12 PM By: Roger Shelter Entered By: Roger Shelter on 12/16/2017 15:46:24 Theurer, [name] ([id number]) -------------------------------------------------------------------------------- Patient/Caregiver Education Details Patient Name: [name]. Date of Service: 12/16/2017 3:30 PM Medical Record Number: [id number] Patient Account Number:  [id number] Date of Birth/Gender: 06-May-1946 (72 y.o. F) Treating RN: Roger Shelter Primary Care Physician: Viviana Simpler Other Clinician: Referring Physician: Viviana Simpler Treating Physician/Extender: Tito Dine in Treatment: 11 Education Assessment Education Provided To: Patient Education Topics Provided Wound/Skin Impairment: Handouts: Caring for Your Ulcer Methods: Explain/Verbal Responses: State content correctly Electronic Signature(s) Signed: 12/18/2017 4:04:12 PM By: Roger Shelter Entered By: Roger Shelter on 12/16/2017 16:17:14 Armstrong, [name] ([id number]) -------------------------------------------------------------------------------- Wound Assessment Details Patient Name: [name]. Date of Service: 12/16/2017 3:30 PM Medical Record Number: [id number] Patient Account Number: [id number] Date of Birth/Sex: 1946-07-15 (72 y.o. F) Treating RN: Roger Shelter Primary Care Star Resler: Viviana Simpler Other Clinician: Referring Exavier Lina: Viviana Simpler Treating Jcion Buddenhagen/Extender: Tito Dine in Treatment: 11 Wound Status Wound Number: 1 Primary Etiology: Diabetic Wound/Ulcer of the Lower Extremity Wound Location: Right Lower Leg - Medial Secondary Trauma, Other Wounding Event: Trauma Etiology: Date Acquired: 08/30/2017 Wound Status: Open Weeks Of Treatment: 11 Comorbid Hypertension, Type II Diabetes, Clustered Wound: No History: Osteoarthritis, Neuropathy Wound Measurements Length: (cm) 0.2 Width: (cm) 0.2 Depth: (cm) 0.1 Area: (cm) 0.031 Volume: (cm) 0.003 % Reduction in Area: 99.6% % Reduction in Volume: 99.6% Epithelialization: Large (67-100%) Tunneling: No Undermining: No Wound Description Classification: Grade 2 Wound Margin: Distinct, outline attached Exudate Amount: None Present Foul Odor After Cleansing: No Slough/Fibrino Yes Wound Bed Granulation Amount: Large (67-100%) Exposed  Structure Granulation Quality: Red Fascia Exposed: No Necrotic Amount: Small (1-33%) Fat Layer (Subcutaneous Tissue) Exposed: Yes Necrotic Quality: Eschar Tendon Exposed: No Muscle Exposed: No Joint Exposed: No Bone Exposed: No Periwound Skin Texture Texture Color No Abnormalities Noted: No No Abnormalities Noted: No Callus: No Atrophie Blanche: No Crepitus: No Cyanosis: No Excoriation: No Ecchymosis: No Induration: No Erythema: No Rash: No Hemosiderin Staining: No Scarring: No Mottled: No Pallor: No Moisture Rubor: No No Abnormalities Noted: No Dry / Scaly: Yes Temperature /  Pain Maceration: No Temperature: No Abnormality Tenderness on Palpation: Yes KAYDINCE, TOWLES ([id number]) Wound Preparation Ulcer Cleansing: Rinsed/Irrigated with Saline Topical Anesthetic Applied: Other: lidocaine 4%, Treatment Notes Wound #1 (Right, Medial Lower Leg) 1. Cleansed with: Clean wound with Normal Saline 2. Anesthetic Topical Lidocaine 4% cream to wound bed prior to debridement 4. Dressing Applied: Prisma Ag Notes kerlix, coban, Electronic Signature(s) Signed: 12/18/2017 4:04:12 PM By: Roger Shelter Entered By: Roger Shelter on 12/16/2017 15:59:42 Rensch, [name] ([id number]) -------------------------------------------------------------------------------- Wound Assessment Details Patient Name: [name]. Date of Service: 12/16/2017 3:30 PM Medical Record Number: [id number] Patient Account Number: [id number] Date of Birth/Sex: November 17, 1945 (72 y.o. F) Treating RN: Roger Shelter Primary Care Denita Lun: Viviana Simpler Other Clinician: Referring Fraser Busche: Viviana Simpler Treating Jewelene Mairena/Extender: Tito Dine in Treatment: 11 Wound Status Wound Number: 2 Primary Etiology: Diabetic Wound/Ulcer of the Lower Extremity Wound Location: Left, Lateral Lower Leg Secondary Trauma, Other Wounding Event: Trauma Etiology: Date Acquired:  08/30/2017 Wound Status: Healed - Epithelialized Weeks Of Treatment: 11 Comorbid Hypertension, Type II Diabetes, Clustered Wound: No History: Osteoarthritis, Neuropathy Wound Measurements Length: (cm) 0 % Width: (cm) 0 % Depth: (cm) 0 Ep Area: (cm) 0 T Volume: (cm) 0 U Reduction in Area: 100% Reduction in Volume: 100% ithelialization: Large (67-100%) unneling: No ndermining: No Wound Description Classification: Grade 2 Wound Margin: Distinct, outline attached Exudate Amount: None Present Foul Odor After Cleansing: No Slough/Fibrino Yes Wound Bed Granulation Amount: Large (67-100%) Exposed Structure Necrotic Amount: None Present (0%) Fascia Exposed: No Fat Layer (Subcutaneous Tissue) Exposed: Yes Tendon Exposed: No Muscle Exposed: No Joint Exposed: No Bone Exposed: No Periwound Skin Texture Texture Color No Abnormalities Noted: No No Abnormalities Noted: No Callus: No Atrophie Blanche: No Crepitus: No Cyanosis: No Excoriation: No Ecchymosis: No Induration: No Erythema: No Rash: No Hemosiderin Staining: No Scarring: No Mottled: No Pallor: No Moisture Rubor: No No Abnormalities Noted: No Dry / Scaly: No Temperature / Pain Maceration: No Temperature: No Abnormality Tenderness on Palpation: Yes SHAUNEE, MULKERN ([id number]) Wound Preparation Ulcer Cleansing: Rinsed/Irrigated with Saline Topical Anesthetic Applied: Other: lidocaine 4%, Electronic Signature(s) Signed: 12/18/2017 4:04:12 PM By: Roger Shelter Entered By: Roger Shelter on 12/16/2017 16:02:48

## 2017-12-20 NOTE — Progress Notes (Signed)
[name], [name] ([id number]) Visit Report for 12/16/2017 HPI Details Patient Name: [name], [name]. Date of Service: 12/16/2017 3:30 PM Medical Record Number: [id number] Patient Account Number: [id number] Date of Birth/Sex: 1945-10-15 (72 y.o. F) Treating RN: Cornell Barman Primary Care Provider: Viviana Simpler Other Clinician: Referring Provider: Viviana Simpler Treating Provider/Extender: Tito Dine in Treatment: 11 History of Present Illness HPI Description: 09/30/17; patient is a 72 year old woman with type 2 diabetes and peripheral neuropathy. On 08/30/17 she fell down some steps. She developed redness and swelling and was seen in her primary physician's office on 09/11/17. She was felt to have cellulitis and given Keflex. She will return for follow-up on 09/18/17 noted to have an 8 x 4 cm contusion on the left lateral lower leg and a 3 x 3 cm contusion on the right medial lower leg. At follow-up on 09/29/16 she had a 4 x 4 centimeter necrotic surface and she's been referred here. The patient is not on blood thinners she does not have a history of falling. She states her diabetes is under good control ABIs in this clinic could not be obtained because of pain. Patient does not have a prior wound history 10/07/17; the patient came back to the clinic last week for a nurse rewrap. Noted that the area on the right medial leg had opened as well. She is using silver alginate under compression. The substantial wound on the left anterior leg is malodorous today. I did a culture of the wound surface but no empiric antibiotics. The area on the right lateral leg is open with a small draining open area with gelatinous old blood. Using pickups and scalpel I remove necrotic surface from around this area over the entire area, pulse fairly substantially inferiorly. We've been using silver alginate to both wound areas 10/14/17; patient still has substantial drainage from these areas although they  actually look some better. The area on the right lateral leg is opened up and there is depth of this. The area on the left leg is larger but more superficial. This area hasn't bridged area of skin that I thought I would have to removed however the granulation as come up and I'm wondering whether we can simply watch this for now these both were originally hematomas from trauma. CULTURE I did last week showed Escherichia coli and Pseudomonas both sensitive to ciprofloxacin. This was called in for her to replace the doxycycline I prescribed empirically 10/21/17; patient is making reasonably good progress in both wound areas include using silver alginate. Paradoxically the larger original wound area actually looks better. There is no evidence of infection. 10/28/17; patient continues to make excellent progress in both wound areas using silver alginate. I have been pleasantly surprised by the progress. I note ABIs of 1.25 on the right and 1.24 on the left. 11/04/17. Both the patient's wound area on the medial right calf and on the left anterior calf continue to make rapid progress. We have been using silver alginate 11/11/17; both of the patient's wounds continue to make nice progress using silver alginate. 11/18/17; the patient is doing nicely using silver alginate under compression. She is leaving this on a week 11/25/17-she is here in follow-up evaluation for bilateral lower extremity ulcerations. There is significant improvement to all areas, there is hyper granulation noted to the right lower extremity which was debrided. We will continue with same treatment plan and she will follow-up next week 12/02/17; bilateral lower extremity ulcerations which were initially traumatic. Large hematomas. She  is generally done very well. We have been using silver alginate. No debridement is necessary 12/09/17; bilateral lower extremity ulcerations which were initially traumatic, large, hematomas. She has done very well I  thought she might actually be closed this week. We've been using silver alginate however both areas have small openings which are still not fully epithelialized 12/16/17; the area on the left leg is healed fully epithelialized. Still a very small fragile open area on the right medial leg. After some discussion we elected to put this back in compression today. She does not have compression stockings. We've been using silver alginate Electronic Signature(s) JULYANA, WOOLVERTON ([id number]) Signed: 12/16/2017 5:02:04 PM By: Linton Ham MD Entered By: Linton Ham on 12/16/2017 16:20:00 [name] ([id number]) -------------------------------------------------------------------------------- Physical Exam Details Patient Name: [name]. Date of Service: 12/16/2017 3:30 PM Medical Record Number: [id number] Patient Account Number: [id number] Date of Birth/Sex: 05/20/1946 (72 y.o. F) Treating RN: Cornell Barman Primary Care Provider: Viviana Simpler Other Clinician: Referring Provider: Viviana Simpler Treating Provider/Extender: Tito Dine in Treatment: 11 Cardiovascular Pedal pulses palpable and strong bilaterally.. Edema present in both extremities.this is mild. Notes wound exam; the area on the left is completely healed. On the right side of very tiny open area remains. Most of this is epithelialized but the epithelium is fragile. We elected to redress this area. No dressing is necessary on the right Electronic Signature(s) Signed: 12/16/2017 5:02:04 PM By: Linton Ham MD Entered By: Linton Ham on 12/16/2017 16:21:07 [name] ([id number]) -------------------------------------------------------------------------------- Physician Orders Details Patient Name: [name]. Date of Service: 12/16/2017 3:30 PM Medical Record Number: [id number] Patient Account Number: [id number] Date of Birth/Sex: 1946-01-10 (72 y.o. F) Treating RN: Roger Shelter Primary Care Provider: Viviana Simpler Other Clinician: Referring Provider: Viviana Simpler Treating Provider/Extender: Tito Dine in Treatment: 74 Verbal / Phone Orders: No Diagnosis Coding Wound Cleansing Wound #1 Right,Medial Lower Leg o Clean wound with Normal Saline. o Cleanse wound with mild soap and water Anesthetic (add to Medication List) Wound #1 Right,Medial Lower Leg o Topical Lidocaine 4% cream applied to wound bed prior to debridement (In Clinic Only). Primary Wound Dressing Wound #1 Right,Medial Lower Leg o Silver Collagen Secondary Dressing Wound #1 Right,Medial Lower Leg o ABD pad Dressing Change Frequency Wound #1 Right,Medial Lower Leg o Change dressing every week Follow-up Appointments Wound #1 Right,Medial Lower Leg o Return Appointment in 1 week. o Nurse Visit as needed Edema Control Wound #1 Right,Medial Lower Leg o Kerlix and Coban - Bilateral - unna to anchor o Elevate legs to the level of the heart and pump ankles as often as possible Additional Orders / Instructions Wound #1 Right,Medial Lower Leg o Increase protein intake. o Other: - Vitamin C, Zinc Electronic Signature(s) Signed: 12/16/2017 5:02:04 PM By: Linton Ham MD Signed: 12/18/2017 4:04:12 PM By: Wynonia Sours, [name] ([id number]) Entered By: Roger Shelter on 12/16/2017 16:04:10 [name] ([id number]) -------------------------------------------------------------------------------- Problem List Details Patient Name: [name]. Date of Service: 12/16/2017 3:30 PM Medical Record Number: [id number] Patient Account Number: [id number] Date of Birth/Sex: 1946/05/11 (72 y.o. F) Treating RN: Cornell Barman Primary Care Provider: Viviana Simpler Other Clinician: Referring Provider: Viviana Simpler Treating Provider/Extender: Tito Dine in Treatment: 11 Active Problems ICD-10 Impacting Encounter Code  Description Active Date Wound Healing Diagnosis L97.218 Non-pressure chronic ulcer of right calf with other specified 09/30/2017 Yes severity E11.40 Type 2 diabetes mellitus with diabetic neuropathy, 09/30/2017 Yes unspecified I87.331 Chronic venous  hypertension (idiopathic) with ulcer and 10/07/2017 Yes inflammation of right lower extremity Inactive Problems ICD-10 Code Description Active Date Inactive Date 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 Date of Service: 12/16/2017 3:30 PM Medical Record Number: [id number] Patient Account Number: [id number] Date of Birth/Sex: 03/19/1946 (72 y.o. F) Treating RN: Cornell Barman Primary Care Provider: Viviana Simpler Other Clinician: Referring Provider: Viviana Simpler Treating Provider/Extender: Tito Dine in Treatment: 11 Subjective History of Present Illness (HPI) 09/30/17; patient is a 72 year old woman with type 2 diabetes and peripheral neuropathy. On 08/30/17 she fell down some steps. She developed redness and swelling and was seen in her primary physician's office on 09/11/17. She was felt to have cellulitis and given Keflex. She will return for follow-up on 09/18/17 noted to have an 8 x 4 cm contusion on the left lateral lower leg and a 3 x 3 cm contusion on the right medial lower leg. At follow-up on 09/29/16 she had a 4 x 4 centimeter necrotic surface and she's been referred here. The patient is not on blood thinners she does not have a history of falling. She states her diabetes is under good control ABIs in this clinic could not be obtained because of pain. Patient does not  have a prior wound history 10/07/17; the patient came back to the clinic last week for a nurse rewrap. Noted that the area on the right medial leg had opened as well. She is using silver alginate under compression. The substantial wound on the left anterior leg is malodorous today. I did a culture of the wound surface but no empiric antibiotics. The area on the right lateral leg is open with a small draining open area with gelatinous old blood. Using pickups and scalpel I remove necrotic surface from around this area over the entire area, pulse fairly substantially inferiorly. We've been using silver alginate to both wound areas 10/14/17; patient still has substantial drainage from these areas although they actually look some better. The area on the right lateral leg is opened up and there is depth of this. The area on the left leg is larger but more superficial. This area hasn't bridged area of skin that I thought I would have to removed however the granulation as come up and I'm wondering whether we can simply watch this for now these both were originally hematomas from trauma. CULTURE I did last week showed Escherichia coli and Pseudomonas both sensitive to ciprofloxacin. This was called in for her to replace the doxycycline I prescribed empirically 10/21/17; patient is making reasonably good progress in both wound areas include using silver alginate. Paradoxically the larger original wound area actually looks better. There is no evidence of infection. 10/28/17; patient continues to make excellent progress in both wound areas using silver alginate. I have been pleasantly surprised by the progress. I note ABIs of 1.25 on the right and 1.24 on the left. 11/04/17. Both the patient's wound area on the medial right calf and on the left anterior calf continue to make rapid progress. We have been using silver alginate 11/11/17; both of the patient's wounds continue to make nice progress using silver  alginate. 11/18/17; the patient is doing nicely using silver alginate under compression. She is leaving this on a week 11/25/17-she is here in  follow-up evaluation for bilateral lower extremity ulcerations. There is significant improvement to all areas, there is hyper granulation noted to the right lower extremity which was debrided. We will continue with same treatment plan and she will follow-up next week 12/02/17; bilateral lower extremity ulcerations which were initially traumatic. Large hematomas. She is generally done very well. We have been using silver alginate. No debridement is necessary 12/09/17; bilateral lower extremity ulcerations which were initially traumatic, large, hematomas. She has done very well I thought she might actually be closed this week. We've been using silver alginate however both areas have small openings which are still not fully epithelialized 12/16/17; the area on the left leg is healed fully epithelialized. Still a very small fragile open area on the right medial leg. After some discussion we elected to put this back in compression today. She does not have compression stockings. We've been using silver alginate [name], [name] ([id number]) Objective Cardiovascular Pedal pulses palpable and strong bilaterally.. Edema present in both extremities.this is mild. General Notes: wound exam; the area on the left is completely healed. On the right side of very tiny open area remains. Most of this is epithelialized but the epithelium is fragile. We elected to redress this area. No dressing is necessary on the right Integumentary (Hair, Skin) Wound #1 status is Open. Original cause of wound was Trauma. The wound is located on the Right,Medial Lower Leg. The wound measures 0.2cm length x 0.2cm width x 0.1cm depth; 0.031cm^2 area and 0.003cm^3 volume. There is Fat Layer (Subcutaneous Tissue) Exposed exposed. There is no tunneling or undermining noted. There is a none present  amount of drainage noted. The wound margin is distinct with the outline attached to the wound base. There is large (67-100%) red granulation within the wound bed. There is a small (1-33%) amount of necrotic tissue within the wound bed including Eschar. The periwound skin appearance exhibited: Dry/Scaly. The periwound skin appearance did not exhibit: Callus, Crepitus, Excoriation, Induration, Rash, Scarring, Maceration, Atrophie Blanche, Cyanosis, Ecchymosis, Hemosiderin Staining, Mottled, Pallor, Rubor, Erythema. Periwound temperature was noted as No Abnormality. The periwound has tenderness on palpation. Wound #2 status is Healed - Epithelialized. Original cause of wound was Trauma. The wound is located on the Left,Lateral Lower Leg. The wound measures 0cm length x 0cm width x 0cm depth; 0cm^2 area and 0cm^3 volume. There is Fat Layer (Subcutaneous Tissue) Exposed exposed. There is no tunneling or undermining noted. There is a none present amount of drainage noted. The wound margin is distinct with the outline attached to the wound base. There is large (67-100%) granulation within the wound bed. There is no necrotic tissue within the wound bed. The periwound skin appearance did not exhibit: Callus, Crepitus, Excoriation, Induration, Rash, Scarring, Dry/Scaly, Maceration, Atrophie Blanche, Cyanosis, Ecchymosis, Hemosiderin Staining, Mottled, Pallor, Rubor, Erythema. Periwound temperature was noted as No Abnormality. The periwound has tenderness on palpation. Assessment Active Problems ICD-10 L97.218 - Non-pressure chronic ulcer of right calf with other specified severity E11.40 - Type 2 diabetes mellitus with diabetic neuropathy, unspecified I87.331 - Chronic venous hypertension (idiopathic) with ulcer and inflammation of right lower extremity Plan Wound Cleansing: Wound #1 Right,Medial Lower Leg: Clean wound with Normal Saline. Cleanse wound with mild soap and water Anesthetic (add to  Medication List): Wound #1 Right,Medial Lower Leg: Topical Lidocaine 4% cream applied to wound bed prior to debridement (In Clinic Only). Primary Wound Dressing: [name], [name] ([id number]) Wound #1 Right,Medial Lower Leg: Silver Collagen Secondary Dressing: Wound #1 Right,Medial Lower  Leg: ABD pad Dressing Change Frequency: Wound #1 Right,Medial Lower Leg: Change dressing every week Follow-up Appointments: Wound #1 Right,Medial Lower Leg: Return Appointment in 1 week. Nurse Visit as needed Edema Control: Wound #1 Right,Medial Lower Leg: Kerlix and Coban - Bilateral - unna to anchor Elevate legs to the level of the heart and pump ankles as often as possible Additional Orders / Instructions: Wound #1 Right,Medial Lower Leg: Increase protein intake. Other: - Vitamin C, Zinc #1continue with silver collagen/ABDs/Kerlix/Covan #2 should be healed next week. #3 she does not have a wound history. Probably has some degree of chronic venous insufficiency although I very much doubt I'm going to be able to talk this lady in the compression stockings. Electronic Signature(s) Signed: 12/16/2017 5:02:04 PM By: Linton Ham MD Entered By: Linton Ham on 12/16/2017 16:22:28 [name] ([id number]) -------------------------------------------------------------------------------- [place] Details Patient Name: [name]. Date of Service: 12/16/2017 Medical Record Number: [id number] Patient Account Number: [id number] Date of Birth/Sex: [birth year] (72 y.o. F) Treating RN: Cornell Barman Primary Care Provider: Viviana Simpler Other Clinician: Referring Provider: Viviana Simpler Treating Provider/Extender: Tito Dine in Treatment: 11 Diagnosis Coding ICD-10 Codes Code Description [phone] Non-pressure chronic ulcer of right calf with other specified severity E11.40 Type 2 diabetes mellitus with diabetic neuropathy, unspecified I87.331 Chronic venous hypertension  (idiopathic) with ulcer and inflammation of right lower extremity Facility Procedures CPT4 Code: [id number] Description: [id number] - WOUND CARE VISIT-LEV 2 EST PT Modifier: Quantity: 1 Physician Procedures CPT4: Description Modifier Quantity Code [id number] - WC PHYS LEVEL 2 - EST PT 1 ICD-10 Diagnosis Description A45.364 Non-pressure chronic ulcer of right calf with other specified severity I87.331 Chronic venous hypertension (idiopathic) with ulcer  and inflammation of right lower extremity Electronic Signature(s) Signed: 12/16/2017 5:02:04 PM By: Linton Ham MD Entered By: Linton Ham on 12/16/2017 16:22:59

## 2017-12-21 ENCOUNTER — Encounter: Payer: Self-pay | Admitting: Internal Medicine

## 2017-12-21 ENCOUNTER — Encounter (INDEPENDENT_AMBULATORY_CARE_PROVIDER_SITE_OTHER): Payer: Self-pay | Admitting: Orthopaedic Surgery

## 2017-12-22 ENCOUNTER — Ambulatory Visit: Payer: Medicare Other | Admitting: Licensed Clinical Social Worker

## 2017-12-23 ENCOUNTER — Encounter: Payer: Medicare HMO | Admitting: Internal Medicine

## 2017-12-23 DIAGNOSIS — E11622 Type 2 diabetes mellitus with other skin ulcer: Secondary | ICD-10-CM | POA: Diagnosis not present

## 2017-12-23 DIAGNOSIS — I87311 Chronic venous hypertension (idiopathic) with ulcer of right lower extremity: Secondary | ICD-10-CM | POA: Diagnosis not present

## 2017-12-23 DIAGNOSIS — L97212 Non-pressure chronic ulcer of right calf with fat layer exposed: Secondary | ICD-10-CM | POA: Diagnosis not present

## 2017-12-23 DIAGNOSIS — E1151 Type 2 diabetes mellitus with diabetic peripheral angiopathy without gangrene: Secondary | ICD-10-CM | POA: Diagnosis not present

## 2017-12-23 DIAGNOSIS — L97819 Non-pressure chronic ulcer of other part of right lower leg with unspecified severity: Secondary | ICD-10-CM | POA: Diagnosis not present

## 2017-12-24 ENCOUNTER — Other Ambulatory Visit (INDEPENDENT_AMBULATORY_CARE_PROVIDER_SITE_OTHER): Payer: Self-pay | Admitting: Orthopaedic Surgery

## 2017-12-24 ENCOUNTER — Other Ambulatory Visit (INDEPENDENT_AMBULATORY_CARE_PROVIDER_SITE_OTHER): Payer: Self-pay | Admitting: Physician Assistant

## 2017-12-24 NOTE — Telephone Encounter (Signed)
Please advise 

## 2017-12-28 NOTE — Progress Notes (Signed)
DAYLAH, SAYAVONG [place] ([id number]) Visit Report for 12/23/2017 HPI Details Patient Name: [name], [name]. Date of Service: 12/23/2017 2:00 PM Medical Record Number: [id number] Patient Account Number: [id number] Date of Birth/Sex: 01-Jul-1946 (71 y.o. F) Treating RN: Cornell Barman Primary Care Provider: Viviana Simpler Other Clinician: Referring Provider: Viviana Simpler Treating Provider/Extender: Tito Dine in Treatment: 12 History of Present Illness HPI Description: 09/30/17; patient is a 72 year old woman with type 2 diabetes and peripheral neuropathy. On 08/30/17 she fell down some steps. She developed redness and swelling and was seen in her primary physician's office on 09/11/17. She was felt to have cellulitis and given Keflex. She will return for follow-up on 09/18/17 noted to have an 8 x 4 cm contusion on the left lateral lower leg and a 3 x 3 cm contusion on the right medial lower leg. At follow-up on 09/29/16 she had a 4 x 4 centimeter necrotic surface and she's been referred here. The patient is not on blood thinners she does not have a history of falling. She states her diabetes is under good control ABIs in this clinic could not be obtained because of pain. Patient does not have a prior wound history 10/07/17; the patient came back to the clinic last week for a nurse rewrap. Noted that the area on the right medial leg had opened as well. She is using silver alginate under compression. The substantial wound on the left anterior leg is malodorous today. I did a culture of the wound surface but no empiric antibiotics. The area on the right lateral leg is open with a small draining open area with gelatinous old blood. Using pickups and scalpel I remove necrotic surface from around this area over the entire area, pulse fairly substantially inferiorly. We've been using silver alginate to both wound areas 10/14/17; patient still has substantial drainage from these areas although they  actually look some better. The area on the right lateral leg is opened up and there is depth of this. The area on the left leg is larger but more superficial. This area hasn't bridged area of skin that I thought I would have to removed however the granulation as come up and I'm wondering whether we can simply watch this for now these both were originally hematomas from trauma. CULTURE I did last week showed Escherichia coli and Pseudomonas both sensitive to ciprofloxacin. This was called in for her to replace the doxycycline I prescribed empirically 10/21/17; patient is making reasonably good progress in both wound areas include using silver alginate. Paradoxically the larger original wound area actually looks better. There is no evidence of infection. 10/28/17; patient continues to make excellent progress in both wound areas using silver alginate. I have been pleasantly surprised by the progress. I note ABIs of 1.25 on the right and 1.24 on the left. 11/04/17. Both the patient's wound area on the medial right calf and on the left anterior calf continue to make rapid progress. We have been using silver alginate 11/11/17; both of the patient's wounds continue to make nice progress using silver alginate. 11/18/17; the patient is doing nicely using silver alginate under compression. She is leaving this on a week 11/25/17-she is here in follow-up evaluation for bilateral lower extremity ulcerations. There is significant improvement to all areas, there is hyper granulation noted to the right lower extremity which was debrided. We will continue with same treatment plan and she will follow-up next week 12/02/17; bilateral lower extremity ulcerations which were initially traumatic. Large hematomas. She  is generally done very well. We have been using silver alginate. No debridement is necessary 12/09/17; bilateral lower extremity ulcerations which were initially traumatic, large, hematomas. She has done very well I  thought she might actually be closed this week. We've been using silver alginate however both areas have small openings which are still not fully epithelialized 12/16/17; the area on the left leg is healed fully epithelialized. Still a very small fragile open area on the right medial leg. After some discussion we elected to put this back in compression today. She does not have compression stockings. We've been using silver alginate 12/23/17; both wound areas are fully epithelialized. The patient to be discharged from the clinic. These were initially traumatic hematomas complicated by probable cellulitis. She does not have a prior wound care history [name], [name] ([id number]) Electronic Signature(s) Signed: 12/23/2017 5:09:03 PM By: Linton Ham MD Entered By: Linton Ham on 12/23/2017 16:16:02 Kerekes, [name] ([id number]) -------------------------------------------------------------------------------- Physical Exam Details Patient Name: [name]. Date of Service: 12/23/2017 2:00 PM Medical Record Number: [id number] Patient Account Number: [id number] Date of Birth/Sex: May 23, 1946 (71 y.o. F) Treating RN: Cornell Barman Primary Care Provider: Viviana Simpler Other Clinician: Referring Provider: Viviana Simpler Treating Provider/Extender: Tito Dine in Treatment: 12 Constitutional Sitting or standing Blood Pressure is within target range for patient.. Pulse regular and within target range for patient.Marland Kitchen Respirations regular, non-labored and within target range.. Temperature is normal and within the target range for the patient.Marland Kitchen appears in no distress. Notes on exam; the area on the left is completely healed and today the area on the right is totally healed. The patient does not have uncontrolled swelling but may have mild chronic venous insufficiency and lymphedema. She is already ordered herself support stockings which seems like a good idea Electronic  Signature(s) Signed: 12/23/2017 5:09:03 PM By: Linton Ham MD Entered By: Linton Ham on 12/23/2017 16:16:59 [name], [name] ([id number]) -------------------------------------------------------------------------------- Physician Orders Details Patient Name: [name]. Date of Service: 12/23/2017 2:00 PM Medical Record Number: [id number] Patient Account Number: [id number] Date of Birth/Sex: [birth year] (71 y.o. F) Treating RN: Cornell Barman Primary Care Provider: Viviana Simpler Other Clinician: Referring Provider: Viviana Simpler Treating Provider/Extender: Tito Dine in Treatment: 12 Verbal / Phone Orders: No Diagnosis Coding Discharge From [facility] Services o Discharge from [place] Complete Electronic Signature(s) Signed: 12/23/2017 5:09:03 PM By: Linton Ham MD Signed: 12/23/2017 5:11:52 PM By: Gretta Cool, BSN, RN, CWS, Kim RN, BSN Entered By: Gretta Cool, BSN, RN, CWS, Kim on 12/23/2017 14:38:45 [name], [name] ([id number]) -------------------------------------------------------------------------------- Problem List Details Patient Name: [name]. Date of Service: 12/23/2017 2:00 PM Medical Record Number: [id number] Patient Account Number: [id number] Date of Birth/Sex: 26-Jun-1946 (71 y.o. F) Treating RN: Cornell Barman Primary Care Provider: Viviana Simpler Other Clinician: Referring Provider: Viviana Simpler Treating Provider/Extender: Tito Dine in Treatment: 12 Active Problems ICD-10 Impacting Encounter Code Description Active Date Wound Healing Diagnosis L97.218 Non-pressure chronic ulcer of right calf with other specified 09/30/2017 Yes severity E11.40 Type 2 diabetes mellitus with diabetic neuropathy, 09/30/2017 Yes unspecified I87.331 Chronic venous hypertension (idiopathic) with ulcer and 10/07/2017 Yes inflammation of right lower extremity Inactive Problems ICD-10 Code Description Active Date Inactive  Date 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e 09/30/2017 09/30/2017 Resolved Problems Electronic Signature(s) Signed: 12/23/2017 5:09:03 PM By: Linton Ham MD Entered By: Linton Ham on 12/23/2017 16:14:01 [name], [name] ([id number]) -------------------------------------------------------------------------------- Progress Note Details Patient Name: [name].  Date of Service: 12/23/2017 2:00 PM Medical Record Number: [id number] Patient Account Number: [id number] Date of Birth/Sex: 10/30/1945 (71 y.o. F) Treating RN: Cornell Barman Primary Care Provider: Viviana Simpler Other Clinician: Referring Provider: Viviana Simpler Treating Provider/Extender: Tito Dine in Treatment: 12 Subjective History of Present Illness (HPI) 09/30/17; patient is a 72 year old woman with type 2 diabetes and peripheral neuropathy. On 08/30/17 she fell down some steps. She developed redness and swelling and was seen in her primary physician's office on 09/11/17. She was felt to have cellulitis and given Keflex. She will return for follow-up on 09/18/17 noted to have an 8 x 4 cm contusion on the left lateral lower leg and a 3 x 3 cm contusion on the right medial lower leg. At follow-up on 09/29/16 she had a 4 x 4 centimeter necrotic surface and she's been referred here. The patient is not on blood thinners she does not have a history of falling. She states her diabetes is under good control ABIs in this clinic could not be obtained because of pain. Patient does not have a prior wound history 10/07/17; the patient came back to the clinic last week for a nurse rewrap. Noted that the area on the right medial leg had opened as well. She is using silver alginate under compression. The substantial wound on the left anterior leg is malodorous today. I did a culture of the wound surface but no empiric antibiotics. The area on the right lateral leg is open with a small draining  open area with gelatinous old blood. Using pickups and scalpel I remove necrotic surface from around this area over the entire area, pulse fairly substantially inferiorly. We've been using silver alginate to both wound areas 10/14/17; patient still has substantial drainage from these areas although they actually look some better. The area on the right lateral leg is opened up and there is depth of this. The area on the left leg is larger but more superficial. This area hasn't bridged area of skin that I thought I would have to removed however the granulation as come up and I'm wondering whether we can simply watch this for now these both were originally hematomas from trauma. CULTURE I did last week showed Escherichia coli and Pseudomonas both sensitive to ciprofloxacin. This was called in for her to replace the doxycycline I prescribed empirically 10/21/17; patient is making reasonably good progress in both wound areas include using silver alginate. Paradoxically the larger original wound area actually looks better. There is no evidence of infection. 10/28/17; patient continues to make excellent progress in both wound areas using silver alginate. I have been pleasantly surprised by the progress. I note ABIs of 1.25 on the right and 1.24 on the left. 11/04/17. Both the patient's wound area on the medial right calf and on the left anterior calf continue to make rapid progress. We have been using silver alginate 11/11/17; both of the patient's wounds continue to make nice progress using silver alginate. 11/18/17; the patient is doing nicely using silver alginate under compression. She is leaving this on a week 11/25/17-she is here in follow-up evaluation for bilateral lower extremity ulcerations. There is significant improvement to all areas, there is hyper granulation noted to the right lower extremity which was debrided. We will continue with same treatment plan and she will follow-up next week 12/02/17;  bilateral lower extremity ulcerations which were initially traumatic. Large hematomas. She is generally done very well. We have been using silver alginate. No debridement is  necessary 12/09/17; bilateral lower extremity ulcerations which were initially traumatic, large, hematomas. She has done very well I thought she might actually be closed this week. We've been using silver alginate however both areas have small openings which are still not fully epithelialized 12/16/17; the area on the left leg is healed fully epithelialized. Still a very small fragile open area on the right medial leg. After some discussion we elected to put this back in compression today. She does not have compression stockings. We've been using silver alginate 12/23/17; both wound areas are fully epithelialized. The patient to be discharged from the clinic. These were initially traumatic hematomas complicated by probable cellulitis. She does not have a prior wound care history [name], [name]. ([id number]) Objective Constitutional Sitting or standing Blood Pressure is within target range for patient.. Pulse regular and within target range for patient.Marland Kitchen Respirations regular, non-labored and within target range.. Temperature is normal and within the target range for the patient.Marland Kitchen appears in no distress. Vitals Time Taken: 2:25 PM, Height: 61 in, Weight: 179.1 lbs, BMI: 33.8, Temperature: 98.0 F, Pulse: 104 bpm, Respiratory Rate: 18 breaths/min, Blood Pressure: 106/75 mmHg. General Notes: on exam; the area on the left is completely healed and today the area on the right is totally healed. The patient does not have uncontrolled swelling but may have mild chronic venous insufficiency and lymphedema. She is already ordered herself support stockings which seems like a good idea Integumentary (Hair, Skin) Wound #1 status is Healed - Epithelialized. Original cause of wound was Trauma. The wound is located on the Right,Medial Lower  Leg. The wound measures 0cm length x 0cm width x 0cm depth; 0cm^2 area and 0cm^3 volume. There is no tunneling or undermining noted. There is a none present amount of drainage noted. The wound margin is distinct with the outline attached to the wound base. There is no granulation within the wound bed. There is no necrotic tissue within the wound bed. The periwound skin appearance exhibited: Dry/Scaly. The periwound skin appearance did not exhibit: Callus, Crepitus, Excoriation, Induration, Rash, Scarring, Maceration, Atrophie Blanche, Cyanosis, Ecchymosis, Hemosiderin Staining, Mottled, Pallor, Rubor, Erythema. Periwound temperature was noted as No Abnormality. The periwound has tenderness on palpation. Assessment Active Problems ICD-10 L97.218 - Non-pressure chronic ulcer of right calf with other specified severity E11.40 - Type 2 diabetes mellitus with diabetic neuropathy, unspecified I87.331 - Chronic venous hypertension (idiopathic) with ulcer and inflammation of right lower extremity Plan Discharge From [facility] Services: Discharge from [facility] Complete #1 the patient to be discharged from the wound care center [facility], [place]. ([id number]) #2 she is already ordered herself support stockings which I think at this point is all I would suggest. These wounds were traumatic after the patient fell down the stairs. There was secondary infection.she is a type II diabetic with neuropathy. Electronic Signature(s) Signed: 12/23/2017 5:09:03 PM By: Linton Ham MD Entered By: Linton Ham on 12/23/2017 16:18:22 [name], [name] ([id number]) -------------------------------------------------------------------------------- [employer] Details Patient Name: [name]. Date of Service: 12/23/2017 Medical Record Number: [id number] Patient Account Number: [id number] Date of Birth/Sex: 1945-10-17 (72 y.o. F) Treating RN: Cornell Barman Primary Care Provider: Viviana Simpler Other  Clinician: Referring Provider: Viviana Simpler Treating Provider/Extender: Tito Dine in Treatment: 12 Diagnosis Coding ICD-10 Codes Code Description [phone] Non-pressure chronic ulcer of right calf with other specified severity E11.40 Type 2 diabetes mellitus with diabetic neuropathy, unspecified I87.331 Chronic venous hypertension (idiopathic) with ulcer and inflammation of right lower extremity Facility  Procedures CPT4 Code: [id number] Description: [phone] - WOUND CARE VISIT-LEV 2 EST PT Modifier: Quantity: 1 Physician Procedures CPT4: Description Modifier Quantity Code [id number] - WC PHYS LEVEL 2 - EST PT 1 ICD-10 Diagnosis Description A45.364 Non-pressure chronic ulcer of right calf with other specified severity I87.331 Chronic venous hypertension (idiopathic) with ulcer  and inflammation of right lower extremity Electronic Signature(s) Signed: 12/23/2017 5:09:03 PM By: Linton Ham MD Entered By: Linton Ham on 12/23/2017 16:18:55

## 2017-12-29 NOTE — Progress Notes (Signed)
Called and spoke to [place] at Dr. Jake Church office to let her know that the patient's phone mailbox is full and cannot leave a message and also the patient's son;s mailbox is full for his phone .

## 2017-12-30 ENCOUNTER — Encounter: Payer: Medicare HMO | Admitting: Internal Medicine

## 2017-12-30 ENCOUNTER — Other Ambulatory Visit (INDEPENDENT_AMBULATORY_CARE_PROVIDER_SITE_OTHER): Payer: Self-pay | Admitting: Orthopaedic Surgery

## 2017-12-30 NOTE — Telephone Encounter (Signed)
Please advise 

## 2017-12-30 NOTE — Progress Notes (Signed)
REMEDY, CORPORAN [place] ([id number]) Visit Report for 12/23/2017 Arrival Information Details Patient Name: [name], [name]. Date of Service: 12/23/2017 2:00 PM Medical Record Number: [id number] Patient Account Number: [id number] Date of Birth/Sex: 01-23-1946 (71 y.o. F) Treating RN: Roger Shelter Primary Care Ailany Koren: Viviana Simpler Other Clinician: Referring Jodelle Fausto: Viviana Simpler Treating Kiylah Loyer/Extender: Tito Dine in Treatment: 12 Visit Information History Since Last Visit All ordered tests and consults were completed: No Patient Arrived: Gilford Rile Added or deleted any medications: No Arrival Time: 14:21 Any new allergies or adverse reactions: No Accompanied By: self Had a fall or experienced change in No Transfer Assistance: None activities of daily living that may affect Patient Identification Verified: Yes risk of falls: Secondary Verification Process Completed: Yes Signs or symptoms of abuse/neglect since last visito No Patient Requires Transmission-Based Precautions: No Hospitalized since last visit: No Patient Has Alerts: Yes Implantable device outside of the clinic excluding No Patient Alerts: DM II cellular tissue based products placed in the center since last visit: Pain Present Now: No Electronic Signature(s) Signed: 12/23/2017 4:34:44 PM By: Roger Shelter Entered By: Roger Shelter on 12/23/2017 14:28:20 [place], [name] ([id number]) -------------------------------------------------------------------------------- Clinic Level of Care Assessment Details Patient Name: [name]. Date of Service: 12/23/2017 2:00 PM Medical Record Number: [id number] Patient Account Number: [id number] Date of Birth/Sex: Sep 20, 1945 (71 y.o. F) Treating RN: Cornell Barman Primary Care Cailah Reach: Viviana Simpler Other Clinician: Referring Jaylend Reiland: Viviana Simpler Treating Gloyd Happ/Extender: Tito Dine in Treatment: 12 Clinic Level of Care Assessment  Items TOOL 4 Quantity Score []  - Use when only an EandM is performed on FOLLOW-UP visit 0 ASSESSMENTS - Nursing Assessment / Reassessment []  - Reassessment of Co-morbidities (includes updates in patient status) 0 X- 1 5 Reassessment of Adherence to Treatment Plan ASSESSMENTS - Wound and Skin Assessment / Reassessment X - Simple Wound Assessment / Reassessment - one wound 1 5 []  - 0 Complex Wound Assessment / Reassessment - multiple wounds []  - 0 Dermatologic / Skin Assessment (not related to wound area) ASSESSMENTS - Focused Assessment []  - Circumferential Edema Measurements - multi extremities 0 []  - 0 Nutritional Assessment / Counseling / Intervention []  - 0 Lower Extremity Assessment (monofilament, tuning fork, pulses) []  - 0 Peripheral Arterial Disease Assessment (using hand held doppler) ASSESSMENTS - Ostomy and/or Continence Assessment and Care []  - Incontinence Assessment and Management 0 []  - 0 Ostomy Care Assessment and Management (repouching, etc.) PROCESS - Coordination of Care X - Simple Patient / Family Education for ongoing care 1 15 []  - 0 Complex (extensive) Patient / Family Education for ongoing care []  - 0 Staff obtains Programmer, systems, Records, Test Results / Process Orders []  - 0 Staff telephones HHA, Nursing Homes / Clarify orders / etc []  - 0 Routine Transfer to another Facility (non-emergent condition) []  - 0 Routine Hospital Admission (non-emergent condition) []  - 0 New Admissions / Biomedical engineer / Ordering NPWT, Apligraf, etc. []  - 0 Emergency Hospital Admission (emergent condition) X- 1 10 Simple Discharge Coordination [name], [name] ([id number]) []  - 0 Complex (extensive) Discharge Coordination PROCESS - Special Needs []  - Pediatric / Minor Patient Management 0 []  - 0 Isolation Patient Management []  - 0 Hearing / Language / Visual special needs []  - 0 Assessment of Community assistance (transportation, D/C planning, etc.) []  -  0 Additional assistance / Altered mentation []  - 0 Support Surface(s) Assessment (bed, cushion, seat, etc.) INTERVENTIONS - Wound Cleansing / Measurement []  - Simple Wound Cleansing - one wound 0 []  -  0 Complex Wound Cleansing - multiple wounds X- 1 5 Wound Imaging (photographs - any number of wounds) []  - 0 Wound Tracing (instead of photographs) []  - 0 Simple Wound Measurement - one wound []  - 0 Complex Wound Measurement - multiple wounds INTERVENTIONS - Wound Dressings []  - Small Wound Dressing one or multiple wounds 0 []  - 0 Medium Wound Dressing one or multiple wounds []  - 0 Large Wound Dressing one or multiple wounds []  - 0 Application of Medications - topical []  - 0 Application of Medications - injection INTERVENTIONS - Miscellaneous []  - External ear exam 0 []  - 0 Specimen Collection (cultures, biopsies, blood, body fluids, etc.) []  - 0 Specimen(s) / Culture(s) sent or taken to Lab for analysis []  - 0 Patient Transfer (multiple staff / Civil Service fast streamer / Similar devices) []  - 0 Simple Staple / Suture removal (25 or less) []  - 0 Complex Staple / Suture removal (26 or more) []  - 0 Hypo / Hyperglycemic Management (close monitor of Blood Glucose) []  - 0 Ankle / Brachial Index (ABI) - do not check if billed separately X- 1 5 Vital Signs [name], [name] ([id number]) Has the patient been seen at the hospital within the last three years: Yes Total Score: 45 Level Of Care: New/Established - Level 2 Electronic Signature(s) Signed: 12/23/2017 5:11:52 PM By: Gretta Cool, BSN, RN, CWS, Kim RN, BSN Entered By: Gretta Cool, BSN, RN, CWS, Kim on 12/23/2017 14:39:17 [name], [name] ([id number]) -------------------------------------------------------------------------------- Encounter Discharge Information Details Patient Name: [name]. Date of Service: 12/23/2017 2:00 PM Medical Record Number: [id number] Patient Account Number: [id number] Date of Birth/Sex: Feb 24, 1946 (71 y.o.  F) Treating RN: Cornell Barman Primary Care Vasilios Ottaway: Viviana Simpler Other Clinician: Referring Lashona Schaaf: Viviana Simpler Treating Horatio Bertz/Extender: Tito Dine in Treatment: 12 Encounter Discharge Information Items Discharge Pain Level: 0 Discharge Condition: Stable Ambulatory Status: Walker Discharge Destination: Home Transportation: Private Auto Accompanied By: self Schedule Follow-up Appointment: Yes Medication Reconciliation completed and No provided to Patient/Care Joyceline Maiorino: Provided on Clinical Summary of Care: 12/23/2017 Form Type Recipient Paper Patient LK Electronic Signature(s) Signed: 12/25/2017 3:53:00 PM By: Ruthine Dose Entered By: Ruthine Dose on 12/23/2017 14:41:58 [name], [name] ([id number]) -------------------------------------------------------------------------------- Lower Extremity Assessment Details Patient Name: [name]. Date of Service: 12/23/2017 2:00 PM Medical Record Number: [id number] Patient Account Number: [id number] Date of Birth/Sex: 1946/02/15 (71 y.o. F) Treating RN: Roger Shelter Primary Care Lenin Kuhnle: Viviana Simpler Other Clinician: Referring Laterrica Libman: Viviana Simpler Treating Slade Pierpoint/Extender: Tito Dine in Treatment: 12 Vascular Assessment Pulses: Dorsalis Pedis Palpable: [Left:Yes] Posterior Tibial Extremity colors, hair growth, and conditions: Extremity Color: [Left:Hyperpigmented] Temperature of Extremity: [Left:Warm] Capillary Refill: [Left:< 3 seconds] Toe Nail Assessment Left: Right: Thick: No Discolored: No Deformed: No Electronic Signature(s) Signed: 12/23/2017 4:34:44 PM By: Roger Shelter Entered By: Roger Shelter on 12/23/2017 14:33:09 [name], [name] ([id number]) -------------------------------------------------------------------------------- Multi Wound Chart Details Patient Name: [name]. Date of Service: 12/23/2017 2:00 PM Medical Record Number:  [id number] Patient Account Number: [id number] Date of Birth/Sex: 1946/08/06 (71 y.o. F) Treating RN: Cornell Barman Primary Care Lilou Kneip: Viviana Simpler Other Clinician: Referring Tynika Luddy: Viviana Simpler Treating Sharvi Mooneyhan/Extender: Tito Dine in Treatment: 12 Vital Signs Height(in): 61 Pulse(bpm): 104 Weight(lbs): 179.1 Blood Pressure(mmHg): 106/75 Body Mass Index(BMI): 34 Temperature(F): 98.0 Respiratory Rate 18 (breaths/min): Photos: [1:No Photos] [N/A:N/A] Wound Location: [1:Right Lower Leg - Medial] [N/A:N/A] Wounding Event: [1:Trauma] [N/A:N/A] Primary Etiology: [1:Diabetic Wound/Ulcer of the Lower Extremity] [N/A:N/A] Secondary Etiology: [1:Trauma, Other] [N/A:N/A] Comorbid History: [1:Hypertension, Type II  Diabetes, Osteoarthritis, Neuropathy] [N/A:N/A] Date Acquired: [1:08/30/2017] [N/A:N/A] Weeks of Treatment: [1:12] [N/A:N/A] Wound Status: [1:Healed - Epithelialized] [N/A:N/A] Measurements L x W x D [1:0x0x0] [N/A:N/A] (cm) Area (cm) : [1:0] [N/A:N/A] Volume (cm) : [1:0] [N/A:N/A] % Reduction in Area: [1:100.00%] [N/A:N/A] % Reduction in Volume: [1:100.00%] [N/A:N/A] Classification: [1:Grade 2] [N/A:N/A] Exudate Amount: [1:None Present] [N/A:N/A] Wound Margin: [1:Distinct, outline attached] [N/A:N/A] Granulation Amount: [1:None Present (0%)] [N/A:N/A] Necrotic Amount: [1:None Present (0%)] [N/A:N/A] Exposed Structures: [1:Fascia: No Fat Layer (Subcutaneous Tissue) Exposed: No Tendon: No Muscle: No Joint: No Bone: No] [N/A:N/A] Epithelialization: [1:Large (67-100%)] [N/A:N/A] Periwound Skin Texture: [1:Excoriation: No Induration: No Callus: No Crepitus: No Rash: No Scarring: No] [N/A:N/A] Periwound Skin Moisture: Dry/Scaly: Yes N/A N/A Maceration: No Periwound Skin Color: Atrophie Blanche: No N/A N/A Cyanosis: No Ecchymosis: No Erythema: No Hemosiderin Staining: No Mottled: No Pallor: No Rubor: No Temperature: No Abnormality N/A  N/A Tenderness on Palpation: Yes N/A N/A Wound Preparation: Ulcer Cleansing: N/A N/A Rinsed/Irrigated with Saline Topical Anesthetic Applied: None Treatment Notes Electronic Signature(s) Signed: 12/23/2017 5:09:03 PM By: Linton Ham MD Entered By: Linton Ham on 12/23/2017 16:14:17 [name], [name] ([id number]) -------------------------------------------------------------------------------- [place] Plan Details Patient Name: [name]. Date of Service: 12/23/2017 2:00 PM Medical Record Number: [id number] Patient Account Number: [id number] Date of Birth/Sex: 06/08/1946 (71 y.o. F) Treating RN: Cornell Barman Primary Care Jakaria Lavergne: Viviana Simpler Other Clinician: Referring Armenta Erskin: Viviana Simpler Treating Terrel Manalo/Extender: Tito Dine in Treatment: 12 Active Inactive ` Abuse / Safety / Falls / Self Care Management Nursing Diagnoses: History of Falls Potential for falls Goals: Patient will not experience any injury related to falls Date Initiated: 09/30/2017 Target Resolution Date: 01/16/2018 Goal Status: Active Interventions: Assess Activities of Daily Living upon admission and as needed Assess fall risk on admission and as needed Assess: immobility, friction, shearing, incontinence upon admission and as needed Assess impairment of mobility on admission and as needed per policy Notes: ` Nutrition Nursing Diagnoses: Imbalanced nutrition Impaired glucose control: actual or potential Potential for alteratiion in Nutrition/Potential for imbalanced nutrition Goals: Patient/caregiver agrees to and verbalizes understanding of need to use nutritional supplements and/or vitamins as prescribed Date Initiated: 09/30/2017 Target Resolution Date: 01/16/2018 Goal Status: Active Patient/caregiver will maintain therapeutic glucose control Date Initiated: 09/30/2017 Target Resolution Date: 01/16/2018 Goal Status: Active Interventions: Assess  patient nutrition upon admission and as needed per policy Provide education on elevated blood sugars and impact on wound healing Notes: [name], [name] ([id number]) Orientation to the Wound Care Program Nursing Diagnoses: Knowledge deficit related to the wound healing center program Goals: Patient/caregiver will verbalize understanding of the [place] Program Date Initiated: 09/30/2017 Target Resolution Date: 10/17/2017 Goal Status: Active Interventions: Provide education on orientation to the wound center Notes: ` Pain, Acute or Chronic Nursing Diagnoses: Pain, acute or chronic: actual or potential Potential alteration in comfort, pain Goals: Patient/caregiver will verbalize adequate pain control between visits Date Initiated: 09/30/2017 Target Resolution Date: 01/16/2018 Goal Status: Active Interventions: Complete pain assessment as per visit requirements Notes: ` Wound/Skin Impairment Nursing Diagnoses: Impaired tissue integrity Knowledge deficit related to ulceration/compromised skin integrity Goals: Ulcer/skin breakdown will have a volume reduction of 80% by week 12 Date Initiated: 09/30/2017 Target Resolution Date: 01/16/2018 Goal Status: Active Interventions: Assess patient/caregiver ability to perform ulcer/skin care regimen upon admission and as needed Assess ulceration(s) every visit Notes: Electronic Signature(s) Signed: 12/23/2017 5:11:52 PM By: Gretta Cool, BSN, RN, CWS, Kim RN, BSN Entered By: Gretta Cool, BSN, RN, CWS, Kim on 12/23/2017 14:38:13  [name], [name] ([id number]) [address] Shironda M. ([id number]) -------------------------------------------------------------------------------- Pain Assessment Details Patient Name: [name], LEAVENS. Date of Service: 12/23/2017 2:00 PM Medical Record Number: [id number] Patient Account Number: [id number] Date of Birth/Sex: 1946-04-12 (71 y.o. F) Treating RN: Roger Shelter Primary Care Alveda Vanhorne: Viviana Simpler  Other Clinician: Referring Zema Lizardo: Viviana Simpler Treating Trev Boley/Extender: Tito Dine in Treatment: 12 Active Problems Location of Pain Severity and Description of Pain Patient Has Paino No Site Locations Pain Management and Medication Current Pain Management: Electronic Signature(s) Signed: 12/23/2017 4:34:44 PM By: Roger Shelter Entered By: Roger Shelter on 12/23/2017 14:26:43 [name], [name] ([id number]) -------------------------------------------------------------------------------- Patient/Caregiver Education Details Patient Name: [name]. Date of Service: 12/23/2017 2:00 PM Medical Record Number: [id number] Patient Account Number: [id number] Date of Birth/Gender: [birth year] (71 y.o. F) Treating RN: Cornell Barman Primary Care Physician: Viviana Simpler Other Clinician: Referring Physician: Viviana Simpler Treating Physician/Extender: Tito Dine in Treatment: 12 Education Assessment Education Provided To: Patient Education Topics Provided Venous: Handouts: Other: compression socks Methods: Demonstration, Explain/Verbal Responses: State content correctly Electronic Signature(s) Signed: 12/23/2017 5:11:52 PM By: Gretta Cool, BSN, RN, CWS, Kim RN, BSN Entered By: Gretta Cool, BSN, RN, CWS, Kim on 12/23/2017 14:42:17 [name], [name] ([id number]) -------------------------------------------------------------------------------- Wound Assessment Details Patient Name: [name]. Date of Service: 12/23/2017 2:00 PM Medical Record Number: [id number] Patient Account Number: [id number] Date of Birth/Sex: 1946/06/15 (71 y.o. F) Treating RN: Roger Shelter Primary Care Dulcy Sida: Viviana Simpler Other Clinician: Referring Cully Luckow: Viviana Simpler Treating Haleem Hanner/Extender: Tito Dine in Treatment: 12 Wound Status Wound Number: 1 Primary Etiology: Diabetic Wound/Ulcer of the Lower Extremity Wound Location: Right Lower Leg -  Medial Secondary Trauma, Other Wounding Event: Trauma Etiology: Date Acquired: 08/30/2017 Wound Status: Healed - Epithelialized Weeks Of Treatment: 12 Comorbid Hypertension, Type II Diabetes, Clustered Wound: No History: Osteoarthritis, Neuropathy Wound Measurements Length: (cm) 0 % Width: (cm) 0 % Depth: (cm) 0 E Area: (cm) 0 Volume: (cm) 0 Reduction in Area: 100% Reduction in Volume: 100% pithelialization: Large (67-100%) Tunneling: No Undermining: No Wound Description Classification: Grade 2 Wound Margin: Distinct, outline attached Exudate Amount: None Present Foul Odor After Cleansing: No Slough/Fibrino No Wound Bed Granulation Amount: None Present (0%) Exposed Structure Necrotic Amount: None Present (0%) Fascia Exposed: No Fat Layer (Subcutaneous Tissue) Exposed: No Tendon Exposed: No Muscle Exposed: No Joint Exposed: No Bone Exposed: No Periwound Skin Texture Texture Color No Abnormalities Noted: No No Abnormalities Noted: No Callus: No Atrophie Blanche: No Crepitus: No Cyanosis: No Excoriation: No Ecchymosis: No Induration: No Erythema: No Rash: No Hemosiderin Staining: No Scarring: No Mottled: No Pallor: No Moisture Rubor: No No Abnormalities Noted: No Dry / Scaly: Yes Temperature / Pain Maceration: No Temperature: No Abnormality Tenderness on Palpation: Yes AYSLIN, KUNDERT ([id number]) Wound Preparation Ulcer Cleansing: Rinsed/Irrigated with Saline Topical Anesthetic Applied: None Electronic Signature(s) Signed: 12/23/2017 4:34:44 PM By: Roger Shelter Entered By: Roger Shelter on 12/23/2017 14:32:29 Bahar, [name] ([id number]) -------------------------------------------------------------------------------- Vitals Details Patient Name: [name]. Date of Service: 12/23/2017 2:00 PM Medical Record Number: [id number] Patient Account Number: [id number] Date of Birth/Sex: 06-22-1946 (71 y.o. F) Treating RN: Roger Shelter Primary Care Alyssamae Klinck: Viviana Simpler Other Clinician: Referring Fallou Hulbert: Viviana Simpler Treating Shivonne Schwartzman/Extender: Tito Dine in Treatment: 12 Vital Signs Time Taken: 14:25 Temperature (F): 98.0 Height (in): 61 Pulse (bpm): 104 Weight (lbs): 179.1 Respiratory Rate (breaths/min): 18 Body Mass Index (BMI): 33.8 Blood Pressure (mmHg): 106/75 Reference Range: 80 - 120 mg / dl Electronic Signature(s) Signed: 12/23/2017  4:34:44 PM By: Roger Shelter Entered ByRoger Shelter on 12/23/2017 14:25:55

## 2018-01-01 ENCOUNTER — Ambulatory Visit: Payer: Self-pay | Admitting: Psychiatry

## 2018-01-05 ENCOUNTER — Other Ambulatory Visit (INDEPENDENT_AMBULATORY_CARE_PROVIDER_SITE_OTHER): Payer: Self-pay | Admitting: Orthopaedic Surgery

## 2018-01-05 ENCOUNTER — Other Ambulatory Visit (HOSPITAL_COMMUNITY): Payer: Self-pay | Admitting: *Deleted

## 2018-01-05 NOTE — Progress Notes (Signed)
SEE WOUND CARE VISIT NOTE 12-23-17 Epic ECHO 04-16-16 EPIC

## 2018-01-05 NOTE — Patient Instructions (Addendum)
[name]  01/05/2018   Your procedure is scheduled on: 01-08-18  Report to [facility]  Entrance  Report to admitting at 1145 AM    Call this number if you have problems the morning of surgery [phone]    Remember: Do not eat food:After Midnight. CLEAR LIQUIDS FROM MIDNIGHT UNTIL 815 AM DAY OF SURGERY, NO CLEAR LIQUIDS AFTER 815 AM DAY OF SURGERY.      CLEAR LIQUID DIET   Foods Allowed                                                                     Foods Excluded  Coffee and tea, regular and decaf                             liquids that you cannot  Plain Jell-O in any flavor                                             see through such as: Fruit ices (not with fruit pulp)                                     milk, soups, orange juice  Iced Popsicles                                    All solid food Carbonated beverages, regular and diet                                    Cranberry, grape and apple juices Sports drinks like Gatorade Lightly seasoned clear broth or consume(fat free) Sugar, honey syrup  Sample Menu Breakfast                                Lunch                                     Supper Cranberry juice                    Beef broth                            Chicken broth Jell-O                                     Grape juice                           Apple juice Coffee  or tea                        Jell-O                                      Popsicle                                                Coffee or tea                        Coffee or tea  _____________________________________________________________________     Take these medicines the morning of surgery with A SIP OF WATER: TYLENOL # 3 IF NEEDED, GABAPENTIN ( NEURONTIN), DULOXETINE (CYMBALTA) DO NOT TAKE ANY DIABETIC MEDICATIONS DAY OF YOUR SURGERY                  How to Manage Your Diabetes Before and After Surgery  Why is it important to control my blood  sugar before and after surgery? . Improving blood sugar levels before and after surgery helps healing and can limit problems. . A way of improving blood sugar control is eating a healthy diet by: o  Eating less sugar and carbohydrates o  Increasing activity/exercise o  Talking with your doctor about reaching your blood sugar goals . High blood sugars (greater than 180 mg/dL) can raise your risk of infections and slow your recovery, so you will need to focus on controlling your diabetes during the weeks before surgery. . Make sure that the doctor who takes care of your diabetes knows about your planned surgery including the date and location.  How do I manage my blood sugar before surgery? . Check your blood sugar at least 4 times a day, starting 2 days before surgery, to make sure that the level is not too high or low. o Check your blood sugar the morning of your surgery when you wake up and every 2 hours until you get to the Short Stay unit. . If your blood sugar is less than 70 mg/dL, you will need to treat for low blood sugar: o Do not take insulin. o Treat a low blood sugar (less than 70 mg/dL) with  cup of clear juice (cranberry or apple), 4 glucose tablets, OR glucose gel. o Recheck blood sugar in 15 minutes after treatment (to make sure it is greater than 70 mg/dL). If your blood sugar is not greater than 70 mg/dL on recheck, call [phone] for further instructions. . Report your blood sugar to the short stay nurse when you get to Short Stay.  . If you are admitted to the hospital after surgery: o Your blood sugar will be checked by the staff and you will probably be given insulin after surgery (instead of oral diabetes medicines) to make sure you have good blood sugar levels. o The goal for blood sugar control after surgery is 80-180 mg/dL.   WHAT DO I DO ABOUT MY DIABETES MEDICATION?  Marland Kitchen Do not take oral diabetes medicines (pills) the morning of surgery.  . THE  DAY  BEFORE  SURGERY ON 01-07-18 TAKE YOUR METFORMIN AS USUAL.       . THE MORNING OF SURGERY ON 01-08-18 DO NOT  TAKE YOUR METFORMIN.              You may not have any metal on your body including hair pins and              piercings  Do not wear jewelry, make-up, lotions, powders or perfumes, deodorant             Do not wear nail polish.  Do not shave  48 hours prior to surgery.               Do not bring valuables to the hospital. [place].  Contacts, dentures or bridgework may not be worn into surgery.  Leave suitcase in the car. After surgery it may be brought to your room.                Please read over the following fact sheets you were given: _____________________________________________________________________  [facility] - Preparing for Surgery Before surgery, you can play an important role.  Because skin is not sterile, your skin needs to be as free of germs as possible.  You can reduce the number of germs on your skin by washing with CHG (chlorahexidine gluconate) soap before surgery.  CHG is an antiseptic cleaner which kills germs and bonds with the skin to continue killing germs even after washing. Please DO NOT use if you have an allergy to CHG or antibacterial soaps.  If your skin becomes reddened/irritated stop using the CHG and inform your nurse when you arrive at Short Stay. Do not shave (including legs and underarms) for at least 48 hours prior to the first CHG shower.  You may shave your face/neck. Please follow these instructions carefully:  1.  Shower with CHG Soap the night before surgery and the  morning of Surgery.  2.  If you choose to wash your hair, wash your hair first as usual with your  normal  shampoo.  3.  After you shampoo, rinse your hair and body thoroughly to remove the  shampoo.                           4.  Use CHG as you would any other liquid soap.  You can apply chg directly  to the skin and wash                        Gently with a scrungie or clean washcloth.  5.  Apply the CHG Soap to your body ONLY FROM THE NECK DOWN.   Do not use on face/ open                           Wound or open sores. Avoid contact with eyes, ears mouth and genitals (private parts).                       Wash face,  Genitals (private parts) with your normal soap.             6.  Wash thoroughly, paying special attention to the area where your surgery  will be performed.  7.  Thoroughly rinse your body with warm water from the neck down.  8.  DO NOT shower/wash with your normal soap after using and rinsing off  the CHG Soap.                9.  Pat yourself dry with a clean towel.            10.  Wear clean pajamas.            11.  Place clean sheets on your bed the night of your first shower and do not  sleep with pets. Day of Surgery : Do not apply any lotions/deodorants the morning of surgery.  Please wear clean clothes to the hospital/surgery center.  FAILURE TO FOLLOW THESE INSTRUCTIONS MAY RESULT IN THE CANCELLATION OF YOUR SURGERY PATIENT SIGNATURE_________________________________  NURSE SIGNATURE__________________________________  ________________________________________________________________________  WHAT IS A BLOOD TRANSFUSION? Blood Transfusion Information  A transfusion is the replacement of blood or some of its parts. Blood is made up of multiple cells which provide different functions.  Red blood cells carry oxygen and are used for blood loss replacement.  White blood cells fight against infection.  Platelets control bleeding.  Plasma helps clot blood.  Other blood products are available for specialized needs, such as hemophilia or other clotting disorders. BEFORE THE TRANSFUSION  Who gives blood for transfusions?   Healthy volunteers who are fully evaluated to make sure their blood is safe. This is blood bank blood. Transfusion therapy is the safest it has ever been in the practice of medicine.  Before blood is taken from a donor, a complete history is taken to make sure that person has no history of diseases nor engages in risky social behavior (examples are intravenous drug use or sexual activity with multiple partners). The donor's travel history is screened to minimize risk of transmitting infections, such as malaria. The donated blood is tested for signs of infectious diseases, such as HIV and hepatitis. The blood is then tested to be sure it is compatible with you in order to minimize the chance of a transfusion reaction. If you or a relative donates blood, this is often done in anticipation of surgery and is not appropriate for emergency situations. It takes many days to process the donated blood. RISKS AND COMPLICATIONS Although transfusion therapy is very safe and saves many lives, the main dangers of transfusion include:   Getting an infectious disease.  Developing a transfusion reaction. This is an allergic reaction to something in the blood you were given. Every precaution is taken to prevent this. The decision to have a blood transfusion has been considered carefully by your caregiver before blood is given. Blood is not given unless the benefits outweigh the risks. AFTER THE TRANSFUSION  Right after receiving a blood transfusion, you will usually feel much better and more energetic. This is especially true if your red blood cells have gotten low (anemic). The transfusion raises the level of the red blood cells which carry oxygen, and this usually causes an energy increase.  The nurse administering the transfusion will monitor you carefully for complications. HOME CARE INSTRUCTIONS  No special instructions are needed after a transfusion. You may find your energy is better. Speak with your caregiver about any limitations on activity for underlying diseases you may have. SEEK MEDICAL CARE IF:   Your condition is not improving after your transfusion.  You develop redness or  irritation at the intravenous (IV) site. SEEK IMMEDIATE MEDICAL CARE IF:  Any of the following symptoms occur over the next 12 hours:  Shaking chills.  You have a temperature by mouth above 102 F (38.9 C), not controlled by  medicine.  Chest, back, or muscle pain.  People around you feel you are not acting correctly or are confused.  Shortness of breath or difficulty breathing.  Dizziness and fainting.  You get a rash or develop hives.  You have a decrease in urine output.  Your urine turns a dark color or changes to pink, red, or brown. Any of the following symptoms occur over the next 10 days:  You have a temperature by mouth above 102 F (38.9 C), not controlled by medicine.  Shortness of breath.  Weakness after normal activity.  The white part of the eye turns yellow (jaundice).  You have a decrease in the amount of urine or are urinating less often.  Your urine turns a dark color or changes to pink, red, or brown. Document Released: 08/22/2000 Document Revised: 11/17/2011 Document Reviewed: 04/10/2008 [facility] Patient Information 2014 [place], [place].  _______________________________________________________________________

## 2018-01-06 ENCOUNTER — Other Ambulatory Visit: Payer: Self-pay

## 2018-01-06 ENCOUNTER — Encounter (HOSPITAL_COMMUNITY)
Admission: RE | Admit: 2018-01-06 | Discharge: 2018-01-06 | Disposition: A | Discharge status: Home / Self Care | Payer: Medicare HMO | Source: Ambulatory Visit | Attending: Orthopaedic Surgery | Admitting: Orthopaedic Surgery

## 2018-01-06 ENCOUNTER — Encounter (HOSPITAL_COMMUNITY): Payer: Self-pay

## 2018-01-06 DIAGNOSIS — R279 Unspecified lack of coordination: Secondary | ICD-10-CM | POA: Diagnosis not present

## 2018-01-06 DIAGNOSIS — R413 Other amnesia: Secondary | ICD-10-CM | POA: Diagnosis present

## 2018-01-06 DIAGNOSIS — M25452 Effusion, left hip: Secondary | ICD-10-CM | POA: Diagnosis present

## 2018-01-06 DIAGNOSIS — M129 Arthropathy, unspecified: Secondary | ICD-10-CM | POA: Diagnosis not present

## 2018-01-06 DIAGNOSIS — M6281 Muscle weakness (generalized): Secondary | ICD-10-CM | POA: Diagnosis not present

## 2018-01-06 DIAGNOSIS — G8929 Other chronic pain: Secondary | ICD-10-CM | POA: Diagnosis present

## 2018-01-06 DIAGNOSIS — M1612 Unilateral primary osteoarthritis, left hip: Secondary | ICD-10-CM | POA: Diagnosis present

## 2018-01-06 DIAGNOSIS — E1140 Type 2 diabetes mellitus with diabetic neuropathy, unspecified: Secondary | ICD-10-CM | POA: Diagnosis present

## 2018-01-06 DIAGNOSIS — D62 Acute posthemorrhagic anemia: Secondary | ICD-10-CM | POA: Diagnosis not present

## 2018-01-06 DIAGNOSIS — Z6833 Body mass index (BMI) 33.0-33.9, adult: Secondary | ICD-10-CM | POA: Diagnosis not present

## 2018-01-06 DIAGNOSIS — R2689 Other abnormalities of gait and mobility: Secondary | ICD-10-CM | POA: Diagnosis not present

## 2018-01-06 DIAGNOSIS — Z743 Need for continuous supervision: Secondary | ICD-10-CM | POA: Diagnosis not present

## 2018-01-06 DIAGNOSIS — F339 Major depressive disorder, recurrent, unspecified: Secondary | ICD-10-CM | POA: Diagnosis not present

## 2018-01-06 DIAGNOSIS — Z888 Allergy status to other drugs, medicaments and biological substances status: Secondary | ICD-10-CM | POA: Diagnosis not present

## 2018-01-06 DIAGNOSIS — Z96642 Presence of left artificial hip joint: Secondary | ICD-10-CM | POA: Diagnosis not present

## 2018-01-06 DIAGNOSIS — M549 Dorsalgia, unspecified: Secondary | ICD-10-CM | POA: Diagnosis present

## 2018-01-06 DIAGNOSIS — M545 Low back pain: Secondary | ICD-10-CM | POA: Diagnosis not present

## 2018-01-06 DIAGNOSIS — F39 Unspecified mood [affective] disorder: Secondary | ICD-10-CM | POA: Diagnosis not present

## 2018-01-06 DIAGNOSIS — I10 Essential (primary) hypertension: Secondary | ICD-10-CM | POA: Diagnosis present

## 2018-01-06 DIAGNOSIS — Z8249 Family history of ischemic heart disease and other diseases of the circulatory system: Secondary | ICD-10-CM | POA: Diagnosis not present

## 2018-01-06 DIAGNOSIS — M5136 Other intervertebral disc degeneration, lumbar region: Secondary | ICD-10-CM | POA: Diagnosis not present

## 2018-01-06 DIAGNOSIS — M25552 Pain in left hip: Secondary | ICD-10-CM | POA: Diagnosis present

## 2018-01-06 DIAGNOSIS — E669 Obesity, unspecified: Secondary | ICD-10-CM | POA: Diagnosis present

## 2018-01-06 DIAGNOSIS — Z471 Aftercare following joint replacement surgery: Secondary | ICD-10-CM | POA: Diagnosis not present

## 2018-01-06 DIAGNOSIS — M48061 Spinal stenosis, lumbar region without neurogenic claudication: Secondary | ICD-10-CM | POA: Diagnosis not present

## 2018-01-06 DIAGNOSIS — K589 Irritable bowel syndrome without diarrhea: Secondary | ICD-10-CM | POA: Diagnosis present

## 2018-01-06 LAB — BASIC METABOLIC PANEL
ANION GAP: 9 (ref 5–15)
BUN: 32 mg/dL — ABNORMAL HIGH (ref 6–20)
CALCIUM: 9.1 mg/dL (ref 8.9–10.3)
CO2: 28 mmol/L (ref 22–32)
Chloride: 105 mmol/L (ref 101–111)
Creatinine, Ser: 0.88 mg/dL (ref 0.44–1.00)
GFR calc non Af Amer: >60 mL/min (ref >60)
Glucose, Bld: 180 mg/dL — ABNORMAL HIGH (ref 65–99)
Potassium: 3.9 mmol/L (ref 3.5–5.1)
SODIUM: 142 mmol/L (ref 135–145)

## 2018-01-06 LAB — GLUCOSE, CAPILLARY: Glucose-Capillary: 157 mg/dL — ABNORMAL HIGH (ref 65–99)

## 2018-01-06 LAB — CBC
HCT: 43.5 % (ref 36.0–46.0)
HEMOGLOBIN: 14.2 g/dL (ref 12.0–15.0)
MCH: 32.9 pg (ref 26.0–34.0)
MCHC: 32.6 g/dL (ref 30.0–36.0)
MCV: 100.9 fL — ABNORMAL HIGH (ref 78.0–100.0)
Platelets: 268 10*3/uL (ref 150–400)
RBC: 4.31 MIL/uL (ref 3.87–5.11)
RDW: 13.6 % (ref 11.5–15.5)
WBC: 10.5 10*3/uL (ref 4.0–10.5)

## 2018-01-06 LAB — SURGICAL PCR SCREEN
MRSA, PCR: NEGATIVE
Staphylococcus aureus: NEGATIVE

## 2018-01-06 LAB — HEMOGLOBIN A1C
HEMOGLOBIN A1C: 6.1 % — AB (ref 4.8–5.6)
Mean Plasma Glucose: 128.37 mg/dL

## 2018-01-06 NOTE — Pre-Procedure Instructions (Signed)
CBC, BMP results 01/06/2018 faxed to Dr. Ninfa Linden via epic.

## 2018-01-06 NOTE — Progress Notes (Addendum)
Pt. Cardiology visit from 11-04-17 on chart ekg from 11-04-17 on chart previous EKG's in epic are consistent with patients history.

## 2018-01-06 NOTE — Telephone Encounter (Signed)
Please advise 

## 2018-01-07 ENCOUNTER — Other Ambulatory Visit (INDEPENDENT_AMBULATORY_CARE_PROVIDER_SITE_OTHER): Payer: Self-pay

## 2018-01-07 LAB — ABO/RH: ABO/RH(D): O POS

## 2018-01-07 MED ORDER — SODIUM CHLORIDE 0.9 % IV SOLN
1000.0000 mg | INTRAVENOUS | Status: AC
  Administered 2018-01-08: 1000 mg via INTRAVENOUS
  Filled 2018-01-07: qty 1100

## 2018-01-07 NOTE — Pre-Procedure Instructions (Signed)
Hgb A1C results 01/06/2018 faxed to Dr. Ninfa Linden via epic.

## 2018-01-08 ENCOUNTER — Inpatient Hospital Stay (HOSPITAL_COMMUNITY)
Admission: RE | Admit: 2018-01-08 | Discharge: 2018-01-12 | DRG: 470 | Disposition: A | Discharge status: Skilled Nursing Facility | Payer: Medicare HMO | Attending: Orthopaedic Surgery | Admitting: Orthopaedic Surgery

## 2018-01-08 ENCOUNTER — Inpatient Hospital Stay (HOSPITAL_COMMUNITY): Payer: Medicare HMO

## 2018-01-08 ENCOUNTER — Inpatient Hospital Stay (HOSPITAL_COMMUNITY): Payer: Medicare HMO | Admitting: Certified Registered Nurse Anesthetist

## 2018-01-08 ENCOUNTER — Encounter (HOSPITAL_COMMUNITY): Payer: Self-pay | Admitting: *Deleted

## 2018-01-08 ENCOUNTER — Encounter (HOSPITAL_COMMUNITY)
Admission: RE | Disposition: A | Discharge status: Skilled Nursing Facility | Payer: Self-pay | Source: Home / Self Care | Attending: Orthopaedic Surgery

## 2018-01-08 ENCOUNTER — Other Ambulatory Visit: Payer: Self-pay

## 2018-01-08 DIAGNOSIS — K589 Irritable bowel syndrome without diarrhea: Secondary | ICD-10-CM | POA: Diagnosis present

## 2018-01-08 DIAGNOSIS — G8929 Other chronic pain: Secondary | ICD-10-CM | POA: Diagnosis present

## 2018-01-08 DIAGNOSIS — M1612 Unilateral primary osteoarthritis, left hip: Secondary | ICD-10-CM | POA: Diagnosis present

## 2018-01-08 DIAGNOSIS — R413 Other amnesia: Secondary | ICD-10-CM | POA: Diagnosis present

## 2018-01-08 DIAGNOSIS — D62 Acute posthemorrhagic anemia: Secondary | ICD-10-CM | POA: Diagnosis not present

## 2018-01-08 DIAGNOSIS — Z8249 Family history of ischemic heart disease and other diseases of the circulatory system: Secondary | ICD-10-CM | POA: Diagnosis not present

## 2018-01-08 DIAGNOSIS — I10 Essential (primary) hypertension: Secondary | ICD-10-CM | POA: Diagnosis present

## 2018-01-08 DIAGNOSIS — M25452 Effusion, left hip: Secondary | ICD-10-CM | POA: Diagnosis present

## 2018-01-08 DIAGNOSIS — E1140 Type 2 diabetes mellitus with diabetic neuropathy, unspecified: Secondary | ICD-10-CM | POA: Diagnosis present

## 2018-01-08 DIAGNOSIS — Z6833 Body mass index (BMI) 33.0-33.9, adult: Secondary | ICD-10-CM

## 2018-01-08 DIAGNOSIS — M25552 Pain in left hip: Secondary | ICD-10-CM | POA: Diagnosis present

## 2018-01-08 DIAGNOSIS — Z888 Allergy status to other drugs, medicaments and biological substances status: Secondary | ICD-10-CM

## 2018-01-08 DIAGNOSIS — E669 Obesity, unspecified: Secondary | ICD-10-CM | POA: Diagnosis present

## 2018-01-08 DIAGNOSIS — M549 Dorsalgia, unspecified: Secondary | ICD-10-CM | POA: Diagnosis present

## 2018-01-08 DIAGNOSIS — Z419 Encounter for procedure for purposes other than remedying health state, unspecified: Secondary | ICD-10-CM

## 2018-01-08 DIAGNOSIS — Z96642 Presence of left artificial hip joint: Secondary | ICD-10-CM | POA: Diagnosis not present

## 2018-01-08 DIAGNOSIS — Z471 Aftercare following joint replacement surgery: Secondary | ICD-10-CM | POA: Diagnosis not present

## 2018-01-08 HISTORY — PX: TOTAL HIP ARTHROPLASTY: SHX124

## 2018-01-08 LAB — TYPE AND SCREEN
ABO/RH(D): O POS
Antibody Screen: NEGATIVE

## 2018-01-08 LAB — GLUCOSE, CAPILLARY
GLUCOSE-CAPILLARY: 89 mg/dL (ref 65–99)
Glucose-Capillary: 172 mg/dL — ABNORMAL HIGH (ref 65–99)

## 2018-01-08 SURGERY — ARTHROPLASTY, HIP, TOTAL, ANTERIOR APPROACH
Anesthesia: Spinal | Site: Hip | Laterality: Left

## 2018-01-08 MED ORDER — ASPIRIN 81 MG PO CHEW
81.0000 mg | CHEWABLE_TABLET | Freq: Two times a day (BID) | ORAL | Status: DC
  Administered 2018-01-08 – 2018-01-12 (×8): 81 mg via ORAL
  Filled 2018-01-08 (×8): qty 1

## 2018-01-08 MED ORDER — SUCCINYLCHOLINE CHLORIDE 200 MG/10ML IV SOSY
PREFILLED_SYRINGE | INTRAVENOUS | Status: AC
  Filled 2018-01-08: qty 10

## 2018-01-08 MED ORDER — SUGAMMADEX SODIUM 200 MG/2ML IV SOLN
INTRAVENOUS | Status: AC
  Filled 2018-01-08: qty 2

## 2018-01-08 MED ORDER — ROCURONIUM BROMIDE 100 MG/10ML IV SOLN
INTRAVENOUS | Status: DC | PRN
  Administered 2018-01-08: 10 mg via INTRAVENOUS
  Administered 2018-01-08: 40 mg via INTRAVENOUS

## 2018-01-08 MED ORDER — CEFAZOLIN SODIUM-DEXTROSE 2-4 GM/100ML-% IV SOLN
2.0000 g | INTRAVENOUS | Status: AC
  Administered 2018-01-08: 2 g via INTRAVENOUS
  Filled 2018-01-08: qty 100

## 2018-01-08 MED ORDER — PROMETHAZINE HCL 25 MG/ML IJ SOLN: 6.2500 mg | INTRAMUSCULAR | Status: DC | PRN

## 2018-01-08 MED ORDER — MENTHOL 3 MG MT LOZG: 1.0000 | LOZENGE | OROMUCOSAL | Status: DC | PRN

## 2018-01-08 MED ORDER — EPHEDRINE 5 MG/ML INJ
INTRAVENOUS | Status: AC
  Filled 2018-01-08: qty 10

## 2018-01-08 MED ORDER — DEXMEDETOMIDINE HCL IN NACL 200 MCG/50ML IV SOLN
INTRAVENOUS | Status: DC | PRN
  Administered 2018-01-08 (×3): 10 ug via INTRAVENOUS
  Administered 2018-01-08: 20 ug via INTRAVENOUS
  Administered 2018-01-08: 10 ug via INTRAVENOUS
  Administered 2018-01-08: 20 ug via INTRAVENOUS

## 2018-01-08 MED ORDER — CEFAZOLIN SODIUM-DEXTROSE 1-4 GM/50ML-% IV SOLN
1.0000 g | Freq: Four times a day (QID) | INTRAVENOUS | Status: AC
  Administered 2018-01-08 (×2): 1 g via INTRAVENOUS
  Filled 2018-01-08 (×2): qty 50

## 2018-01-08 MED ORDER — ACETAMINOPHEN 325 MG PO TABS
325.0000 mg | ORAL_TABLET | Freq: Four times a day (QID) | ORAL | Status: DC | PRN
  Administered 2018-01-11 – 2018-01-12 (×2): 325 mg via ORAL
  Filled 2018-01-08 (×2): qty 1

## 2018-01-08 MED ORDER — ONDANSETRON HCL 4 MG/2ML IJ SOLN
INTRAMUSCULAR | Status: AC
  Filled 2018-01-08: qty 2

## 2018-01-08 MED ORDER — HYDROCODONE-ACETAMINOPHEN 7.5-325 MG PO TABS: 1.0000 | ORAL_TABLET | Freq: Once | ORAL | Status: DC | PRN

## 2018-01-08 MED ORDER — LACTATED RINGERS IV SOLN
INTRAVENOUS | Status: DC
  Administered 2018-01-08 (×3): via INTRAVENOUS

## 2018-01-08 MED ORDER — ACETAMINOPHEN 10 MG/ML IV SOLN
INTRAVENOUS | Status: AC
  Filled 2018-01-08: qty 100

## 2018-01-08 MED ORDER — SODIUM CHLORIDE 0.9 % IR SOLN
Status: DC | PRN
  Administered 2018-01-08: 1000 mL

## 2018-01-08 MED ORDER — HYDROMORPHONE HCL 1 MG/ML IJ SOLN
INTRAMUSCULAR | Status: AC
  Filled 2018-01-08: qty 1

## 2018-01-08 MED ORDER — LABETALOL HCL 5 MG/ML IV SOLN
INTRAVENOUS | Status: DC | PRN
  Administered 2018-01-08 (×2): 2.5 mg via INTRAVENOUS

## 2018-01-08 MED ORDER — METOCLOPRAMIDE HCL 5 MG PO TABS: 5.0000 mg | ORAL_TABLET | Freq: Three times a day (TID) | ORAL | Status: DC | PRN

## 2018-01-08 MED ORDER — FENTANYL CITRATE (PF) 250 MCG/5ML IJ SOLN
INTRAMUSCULAR | Status: AC
  Filled 2018-01-08: qty 5

## 2018-01-08 MED ORDER — POLYETHYLENE GLYCOL 3350 17 G PO PACK: 17.0000 g | PACK | Freq: Every day | ORAL | Status: DC | PRN

## 2018-01-08 MED ORDER — ADULT MULTIVITAMIN W/MINERALS CH
1.0000 | ORAL_TABLET | Freq: Every day | ORAL | Status: DC
  Administered 2018-01-10 – 2018-01-12 (×3): 1 via ORAL
  Filled 2018-01-08 (×4): qty 1

## 2018-01-08 MED ORDER — OXYCODONE HCL 5 MG PO TABS
5.0000 mg | ORAL_TABLET | ORAL | Status: DC | PRN
  Administered 2018-01-08 – 2018-01-09 (×3): 5 mg via ORAL
  Administered 2018-01-09 – 2018-01-12 (×5): 10 mg via ORAL
  Filled 2018-01-08 (×2): qty 1
  Filled 2018-01-08 (×2): qty 2
  Filled 2018-01-08: qty 1
  Filled 2018-01-08: qty 2
  Filled 2018-01-08: qty 1
  Filled 2018-01-08: qty 2
  Filled 2018-01-08: qty 1
  Filled 2018-01-08: qty 2
  Filled 2018-01-08 (×2): qty 1
  Filled 2018-01-08: qty 2

## 2018-01-08 MED ORDER — ALUM & MAG HYDROXIDE-SIMETH 200-200-20 MG/5ML PO SUSP: 30.0000 mL | ORAL | Status: DC | PRN

## 2018-01-08 MED ORDER — HYDROMORPHONE HCL 1 MG/ML IJ SOLN
0.5000 mg | INTRAMUSCULAR | Status: DC | PRN
  Administered 2018-01-09: 1 mg via INTRAVENOUS
  Filled 2018-01-08: qty 1

## 2018-01-08 MED ORDER — LABETALOL HCL 5 MG/ML IV SOLN
INTRAVENOUS | Status: AC
Start: 2018-01-08 — End: 2018-01-08
  Filled 2018-01-08: qty 4

## 2018-01-08 MED ORDER — MIDAZOLAM HCL 5 MG/5ML IJ SOLN
INTRAMUSCULAR | Status: DC | PRN
  Administered 2018-01-08 (×2): 1 mg via INTRAVENOUS

## 2018-01-08 MED ORDER — DULOXETINE HCL 60 MG PO CPEP
60.0000 mg | ORAL_CAPSULE | Freq: Two times a day (BID) | ORAL | Status: DC
  Administered 2018-01-08 – 2018-01-12 (×8): 60 mg via ORAL
  Filled 2018-01-08 (×8): qty 1

## 2018-01-08 MED ORDER — MULTI-VITAMIN/MINERALS PO TABS: 1.0000 | ORAL_TABLET | Freq: Every day | ORAL | Status: DC

## 2018-01-08 MED ORDER — PROPOFOL 10 MG/ML IV BOLUS
INTRAVENOUS | Status: DC | PRN
  Administered 2018-01-08: 150 mg via INTRAVENOUS

## 2018-01-08 MED ORDER — DOCUSATE SODIUM 100 MG PO CAPS
100.0000 mg | ORAL_CAPSULE | Freq: Two times a day (BID) | ORAL | Status: DC
  Administered 2018-01-08 – 2018-01-12 (×7): 100 mg via ORAL
  Filled 2018-01-08 (×8): qty 1

## 2018-01-08 MED ORDER — ONDANSETRON HCL 4 MG/2ML IJ SOLN
INTRAMUSCULAR | Status: DC | PRN
  Administered 2018-01-08: 4 mg via INTRAVENOUS

## 2018-01-08 MED ORDER — ZINC SULFATE 220 (50 ZN) MG PO CAPS
220.0000 mg | ORAL_CAPSULE | Freq: Every day | ORAL | Status: DC
  Administered 2018-01-10 – 2018-01-12 (×3): 220 mg via ORAL
  Filled 2018-01-08 (×4): qty 1

## 2018-01-08 MED ORDER — VITAMIN C 500 MG PO TABS
500.0000 mg | ORAL_TABLET | Freq: Every day | ORAL | Status: DC
  Administered 2018-01-10 – 2018-01-12 (×3): 500 mg via ORAL
  Filled 2018-01-08 (×4): qty 1

## 2018-01-08 MED ORDER — CHROMIUM PICOLINATE 1000 MCG PO TABS: 1000.0000 ug | ORAL_TABLET | Freq: Every day | ORAL | Status: DC

## 2018-01-08 MED ORDER — FENTANYL CITRATE (PF) 100 MCG/2ML IJ SOLN
INTRAMUSCULAR | Status: AC
  Filled 2018-01-08: qty 2

## 2018-01-08 MED ORDER — OXYCODONE HCL 5 MG PO TABS
10.0000 mg | ORAL_TABLET | ORAL | Status: DC | PRN
  Administered 2018-01-09 (×2): 10 mg via ORAL
  Administered 2018-01-10 – 2018-01-12 (×5): 15 mg via ORAL
  Filled 2018-01-08 (×2): qty 2
  Filled 2018-01-08 (×3): qty 3

## 2018-01-08 MED ORDER — PANTOPRAZOLE SODIUM 40 MG PO TBEC
40.0000 mg | DELAYED_RELEASE_TABLET | Freq: Every day | ORAL | Status: DC
  Administered 2018-01-08 – 2018-01-12 (×5): 40 mg via ORAL
  Filled 2018-01-08 (×5): qty 1

## 2018-01-08 MED ORDER — PROPOFOL 10 MG/ML IV BOLUS
INTRAVENOUS | Status: AC
  Filled 2018-01-08: qty 20

## 2018-01-08 MED ORDER — GABAPENTIN 300 MG PO CAPS
900.0000 mg | ORAL_CAPSULE | Freq: Every day | ORAL | Status: DC
  Administered 2018-01-08 – 2018-01-11 (×4): 900 mg via ORAL
  Filled 2018-01-08 (×4): qty 3

## 2018-01-08 MED ORDER — DIPHENHYDRAMINE HCL 12.5 MG/5ML PO ELIX: 12.5000 mg | ORAL_SOLUTION | ORAL | Status: DC | PRN

## 2018-01-08 MED ORDER — METOCLOPRAMIDE HCL 5 MG/ML IJ SOLN: 5.0000 mg | Freq: Three times a day (TID) | INTRAMUSCULAR | Status: DC | PRN

## 2018-01-08 MED ORDER — PHENOL 1.4 % MT LIQD
1.0000 | OROMUCOSAL | Status: DC | PRN
  Filled 2018-01-08: qty 177

## 2018-01-08 MED ORDER — FENTANYL CITRATE (PF) 100 MCG/2ML IJ SOLN
INTRAMUSCULAR | Status: DC | PRN
  Administered 2018-01-08 (×5): 50 ug via INTRAVENOUS
  Administered 2018-01-08: 100 ug via INTRAVENOUS

## 2018-01-08 MED ORDER — GABAPENTIN 300 MG PO CAPS
600.0000 mg | ORAL_CAPSULE | Freq: Two times a day (BID) | ORAL | Status: DC
  Administered 2018-01-09 – 2018-01-12 (×8): 600 mg via ORAL
  Filled 2018-01-08 (×9): qty 2

## 2018-01-08 MED ORDER — CHLORHEXIDINE GLUCONATE 4 % EX LIQD: 60.0000 mL | Freq: Once | CUTANEOUS | Status: DC

## 2018-01-08 MED ORDER — HYDROMORPHONE HCL 1 MG/ML IJ SOLN
0.2500 mg | INTRAMUSCULAR | Status: DC | PRN
  Administered 2018-01-08 (×2): 0.5 mg via INTRAVENOUS
  Administered 2018-01-08: 0.25 mg via INTRAVENOUS
  Administered 2018-01-08: 0.5 mg via INTRAVENOUS
  Administered 2018-01-08: 0.25 mg via INTRAVENOUS

## 2018-01-08 MED ORDER — ZINC GLUCONATE 50 MG PO TABS: 50.0000 mg | ORAL_TABLET | Freq: Every day | ORAL | Status: DC

## 2018-01-08 MED ORDER — MIDAZOLAM HCL 2 MG/2ML IJ SOLN
INTRAMUSCULAR | Status: AC
  Filled 2018-01-08: qty 2

## 2018-01-08 MED ORDER — MEPERIDINE HCL 50 MG/ML IJ SOLN: 6.2500 mg | INTRAMUSCULAR | Status: DC | PRN

## 2018-01-08 MED ORDER — EPHEDRINE SULFATE-NACL 50-0.9 MG/10ML-% IV SOSY
PREFILLED_SYRINGE | INTRAVENOUS | Status: DC | PRN
  Administered 2018-01-08 (×6): 10 mg via INTRAVENOUS

## 2018-01-08 MED ORDER — CO Q 10 100 MG PO CAPS: 100.0000 mg | ORAL_CAPSULE | Freq: Every day | ORAL | Status: DC

## 2018-01-08 MED ORDER — METFORMIN HCL ER 500 MG PO TB24
500.0000 mg | ORAL_TABLET | Freq: Every day | ORAL | Status: DC
  Administered 2018-01-09 – 2018-01-12 (×4): 500 mg via ORAL
  Filled 2018-01-08 (×4): qty 1

## 2018-01-08 MED ORDER — SUGAMMADEX SODIUM 200 MG/2ML IV SOLN
INTRAVENOUS | Status: DC | PRN
  Administered 2018-01-08: 200 mg via INTRAVENOUS

## 2018-01-08 MED ORDER — METHOCARBAMOL 1000 MG/10ML IJ SOLN
500.0000 mg | Freq: Four times a day (QID) | INTRAVENOUS | Status: DC | PRN
  Administered 2018-01-08: 500 mg via INTRAVENOUS
  Filled 2018-01-08: qty 550

## 2018-01-08 MED ORDER — METHOCARBAMOL 500 MG PO TABS
500.0000 mg | ORAL_TABLET | Freq: Four times a day (QID) | ORAL | Status: DC | PRN
  Administered 2018-01-09 – 2018-01-12 (×7): 500 mg via ORAL
  Filled 2018-01-08 (×7): qty 1

## 2018-01-08 MED ORDER — SODIUM CHLORIDE 0.9 % IV SOLN
INTRAVENOUS | Status: DC
  Administered 2018-01-08: 18:00:00 via INTRAVENOUS

## 2018-01-08 MED ORDER — DEXAMETHASONE SODIUM PHOSPHATE 10 MG/ML IJ SOLN
INTRAMUSCULAR | Status: AC
  Filled 2018-01-08: qty 1

## 2018-01-08 MED ORDER — ALBUMIN HUMAN 5 % IV SOLN
INTRAVENOUS | Status: DC | PRN
  Administered 2018-01-08: 14:00:00 via INTRAVENOUS

## 2018-01-08 MED ORDER — SUCCINYLCHOLINE CHLORIDE 20 MG/ML IJ SOLN
INTRAMUSCULAR | Status: DC | PRN
  Administered 2018-01-08: 100 mg via INTRAVENOUS

## 2018-01-08 MED ORDER — LIDOCAINE 2% (20 MG/ML) 5 ML SYRINGE
INTRAMUSCULAR | Status: AC
  Filled 2018-01-08: qty 5

## 2018-01-08 MED ORDER — 0.9 % SODIUM CHLORIDE (POUR BTL) OPTIME
TOPICAL | Status: DC | PRN
  Administered 2018-01-08: 1000 mL

## 2018-01-08 MED ORDER — ROCURONIUM BROMIDE 10 MG/ML (PF) SYRINGE
PREFILLED_SYRINGE | INTRAVENOUS | Status: AC
  Filled 2018-01-08: qty 5

## 2018-01-08 MED ORDER — ACETAMINOPHEN-CODEINE #3 300-30 MG PO TABS
1.0000 | ORAL_TABLET | Freq: Once | ORAL | Status: AC
  Administered 2018-01-08: 1 via ORAL
  Filled 2018-01-08: qty 1

## 2018-01-08 MED ORDER — ACETAMINOPHEN 10 MG/ML IV SOLN
1000.0000 mg | Freq: Once | INTRAVENOUS | Status: DC | PRN
  Administered 2018-01-08: 1000 mg via INTRAVENOUS

## 2018-01-08 MED ORDER — ONDANSETRON HCL 4 MG/2ML IJ SOLN: 4.0000 mg | Freq: Four times a day (QID) | INTRAMUSCULAR | Status: DC | PRN

## 2018-01-08 MED ORDER — ONDANSETRON HCL 4 MG PO TABS
4.0000 mg | ORAL_TABLET | Freq: Four times a day (QID) | ORAL | Status: DC | PRN
Start: 2018-01-08 — End: 2018-01-12

## 2018-01-08 SURGICAL SUPPLY — 34 items
BAG ZIPLOCK 12X15 (MISCELLANEOUS) ×2 IMPLANT
BENZOIN TINCTURE PRP APPL 2/3 (GAUZE/BANDAGES/DRESSINGS) IMPLANT
BLADE SAW SGTL 18X1.27X75 (BLADE) ×2 IMPLANT
CAPT HIP TOTAL 2 ×2 IMPLANT
COVER PERINEAL POST (MISCELLANEOUS) ×2 IMPLANT
COVER SURGICAL LIGHT HANDLE (MISCELLANEOUS) ×2 IMPLANT
DRAPE STERI IOBAN 125X83 (DRAPES) ×2 IMPLANT
DRAPE TOP SHEET (DRAPES) ×2 IMPLANT
DRAPE U-SHAPE 47X51 STRL (DRAPES) ×4 IMPLANT
DRSG AQUACEL AG ADV 3.5X10 (GAUZE/BANDAGES/DRESSINGS) ×2 IMPLANT
DURAPREP 26ML APPLICATOR (WOUND CARE) ×2 IMPLANT
ELECT REM PT RETURN 15FT ADLT (MISCELLANEOUS) ×2 IMPLANT
GAUZE XEROFORM 1X8 LF (GAUZE/BANDAGES/DRESSINGS) ×2 IMPLANT
GLOVE BIO SURGEON STRL SZ7.5 (GLOVE) ×2 IMPLANT
GLOVE BIOGEL PI IND STRL 8 (GLOVE) ×2 IMPLANT
GLOVE BIOGEL PI INDICATOR 8 (GLOVE) ×2
GLOVE ECLIPSE 8.0 STRL XLNG CF (GLOVE) ×4 IMPLANT
GOWN STRL REUS W/TWL XL LVL3 (GOWN DISPOSABLE) ×4 IMPLANT
HANDPIECE INTERPULSE COAX TIP (DISPOSABLE) ×1
HOLDER FOLEY CATH W/STRAP (MISCELLANEOUS) ×2 IMPLANT
PACK ANTERIOR HIP CUSTOM (KITS) ×2 IMPLANT
SET HNDPC FAN SPRY TIP SCT (DISPOSABLE) ×1 IMPLANT
STAPLER VISISTAT 35W (STAPLE) IMPLANT
STEM CORAIL KA12 (Stem) ×2 IMPLANT
STRIP CLOSURE SKIN 1/2X4 (GAUZE/BANDAGES/DRESSINGS) IMPLANT
SUT ETHIBOND NAB CT1 #1 30IN (SUTURE) ×2 IMPLANT
SUT MNCRL AB 4-0 PS2 18 (SUTURE) IMPLANT
SUT VIC AB 0 CT1 36 (SUTURE) ×2 IMPLANT
SUT VIC AB 1 CT1 36 (SUTURE) ×2 IMPLANT
SUT VIC AB 2-0 CT1 27 (SUTURE) ×2
SUT VIC AB 2-0 CT1 TAPERPNT 27 (SUTURE) ×2 IMPLANT
TRAY FOLEY W/METER SILVER 14FR (SET/KITS/TRAYS/PACK) ×2 IMPLANT
TRAY FOLEY W/METER SILVER 16FR (SET/KITS/TRAYS/PACK) IMPLANT
YANKAUER SUCT BULB TIP 10FT TU (MISCELLANEOUS) ×2 IMPLANT

## 2018-01-08 NOTE — H&P (Signed)
TOTAL HIP ADMISSION H&P  Patient is admitted for left total hip arthroplasty.  Subjective:  Chief Complaint: left hip pain  HPI: [name], 72 y.o. female, has a history of pain and functional disability in the left hip(s) due to arthritis and patient has failed non-surgical conservative treatments for greater than 12 weeks to include NSAID's and/or analgesics, flexibility and strengthening excercises, use of assistive devices, weight reduction as appropriate and activity modification.  Onset of symptoms was gradual starting 2 years ago with gradually worsening course since that time.The patient noted no past surgery on the left hip(s).  Patient currently rates pain in the left hip at 10 out of 10 with activity. Patient has night pain, worsening of pain with activity and weight bearing, trendelenberg gait, pain that interfers with activities of daily living and pain with passive range of motion. Patient has evidence of subchondral cysts, subchondral sclerosis, periarticular osteophytes and joint space narrowing by imaging studies. This condition presents safety issues increasing the risk of falls.  There is no current active infection.  Patient Active Problem List   Diagnosis Date Noted  . Unilateral primary osteoarthritis, left hip 01/08/2018  . Left hip pain 12/09/2017  . Leg ulcer ([place]) 09/29/2017  . Sore throat 09/29/2017  . Contusion of left lower leg 09/18/2017  . Pain syndrome, chronic 03/13/2017  . Facet arthropathy, lumbar 03/13/2017  . Advance directive discussed with patient 01/15/2016  . Spinal stenosis of lumbar region with neurogenic claudication 11/07/2015  . Other intervertebral disc degeneration, lumbar region 06/01/2015  . Chronic back pain 03/29/2015  . Preventative health care 01/10/2015  . Osteoarthritis, hip, bilateral 11/28/2014  . Peripheral neuropathy 11/28/2014  . Type 2 diabetes, controlled, with neuropathy ([place]) 01/20/2014  . Episodic mood disorder ([place])  01/20/2014  . IBS (irritable bowel syndrome) 12/27/2012  . Essential hypertension, benign 08/06/2010   Past Medical History:  Diagnosis Date  . Arthritis   . Confusion   . Diabetes mellitus without complication ([place])    type 2  . Hepatitis    pt. denies  . Hypertension   . Lumbar stenosis   . Memory loss   . Nocturia   . Numbness and tingling    legs and feet  . Wears glasses     Past Surgical History:  Procedure Laterality Date  . [place]  . COLONOSCOPY    . HEMORRHOID SURGERY  2015  . JOINT REPLACEMENT     01-08-18 Dr. Kathrynn Speed  . TRANSFORAMINAL LUMBAR INTERBODY FUSION (TLIF) WITH PEDICLE SCREW FIXATION 2 LEVEL N/A 11/07/2015   Procedure: Lumbar three-four Lumbar four-five  transforaminal lumbar interbody fusion with interbody prosthesis posterior lateral arthrodesis and posterior segmental instrumentation;  Surgeon: Newman Pies, MD;  Location: [facility];  Service: Neurosurgery;  Laterality: N/A;    Current Facility-Administered Medications  Medication Dose Route Frequency Provider Last Rate Last Dose  . ceFAZolin (ANCEF) IVPB 2g/100 mL premix  2 g Intravenous On Call to OR Pete Pelt, PA-C      . chlorhexidine (HIBICLENS) 4 % liquid 4 application  60 mL Topical Once Erskine Emery W, PA-C      . lactated ringers infusion   Intravenous Continuous Janeece Riggers, MD 20 mL/hr at 01/08/18 1057    . tranexamic acid (CYKLOKAPRON) 1,000 mg in sodium chloride 0.9 % 100 mL IVPB  1,000 mg Intravenous On Call to [place], Cindie Laroche, RPH       Allergies  Allergen Reactions  .  Acyclovir And Related     Social History   Tobacco Use  . Smoking status: Never Smoker  . Smokeless tobacco: Never Used  Substance Use Topics  . Alcohol use: No    Comment: rarely    Family History  Problem Relation Age of Onset  . Hypertension Other   . Cancer Other        ovarian  . Cancer Mother   . Heart disease Father      Review of Systems  Musculoskeletal:  Positive for back pain and joint pain.  All other systems reviewed and are negative.   Objective:  Physical Exam  Constitutional: She is oriented to person, place, and time. She appears well-developed and well-nourished.  HENT:  Head: Normocephalic and atraumatic.  Eyes: Pupils are equal, round, and reactive to light. EOM are normal.  Neck: Normal range of motion. Neck supple.  Cardiovascular: Normal rate and regular rhythm.  Respiratory: Effort normal and breath sounds normal.  GI: Soft. Bowel sounds are normal.  Musculoskeletal:       Left hip: She exhibits decreased range of motion, decreased strength, tenderness and bony tenderness.  Neurological: She is alert and oriented to person, place, and time.  Skin: Skin is warm and dry.  Psychiatric: She has a normal mood and affect.    Vital signs in last 24 hours: Temp:  [97.9 F (36.6 C)] 97.9 F (36.6 C) (05/03 1031) Pulse Rate:  [82] 82 (05/03 1031) Resp:  [16] 16 (05/03 1031) BP: (149-174)/(93-108) 174/93 (05/03 1106) SpO2:  [97 %] 97 % (05/03 1031) Weight:  [180 lb (81.6 kg)] 180 lb (81.6 kg) (05/03 1049)  Labs:   Estimated body mass index is 33.46 kg/m as calculated from the following:   Height as of this encounter: 5' 1.5" (1.562 m).   Weight as of this encounter: 180 lb (81.6 kg).   Imaging Review Plain radiographs demonstrate severe degenerative joint disease of the left hip(s). The bone quality appears to be good for age and reported activity level.    Preoperative templating of the joint replacement has been completed, documented, and submitted to the Operating Room personnel in order to optimize intra-operative equipment management.   Anticipated LOS equal to or greater than 2 midnights due to - Age 58 and older with one or more of the following:  - Obesity  - Expected need for hospital services (PT, OT, Nursing) required for safe  discharge  - Anticipated need for postoperative skilled nursing care or  inpatient rehab  - Active co-morbidities: Diabetes OR   - Unanticipated findings during/Post Surgery: Slow post-op progression: GI, pain control, mobility  - Patient is a high risk of re-admission due to: None     Assessment/Plan:  End stage arthritis, left hip(s)  The patient history, physical examination, clinical judgement of the provider and imaging studies are consistent with end stage degenerative joint disease of the left hip(s) and total hip arthroplasty is deemed medically necessary. The treatment options including medical management, injection therapy, arthroscopy and arthroplasty were discussed at length. The risks and benefits of total hip arthroplasty were presented and reviewed. The risks due to aseptic loosening, infection, stiffness, dislocation/subluxation,  thromboembolic complications and other imponderables were discussed.  The patient acknowledged the explanation, agreed to proceed with the plan and consent was signed. Patient is being admitted for inpatient treatment for surgery, pain control, PT, OT, prophylactic antibiotics, VTE prophylaxis, progressive ambulation and ADL's and discharge planning.The patient is planning to be discharged to  be determined

## 2018-01-08 NOTE — Anesthesia Postprocedure Evaluation (Signed)
Anesthesia Post Note  Patient: [name]  Procedure(s) Performed: LEFT TOTAL HIP ARTHROPLASTY ANTERIOR APPROACH (Left Hip)     Patient location during evaluation: PACU Anesthesia Type: General Level of consciousness: awake and alert Pain management: pain level controlled Vital Signs Assessment: post-procedure vital signs reviewed and stable Respiratory status: spontaneous breathing, nonlabored ventilation, respiratory function stable and patient connected to nasal cannula oxygen Cardiovascular status: blood pressure returned to baseline and stable Postop Assessment: no apparent nausea or vomiting Anesthetic complications: no    Last Vitals:  Vitals:   01/08/18 1545 01/08/18 1600  BP: (!) 115/55 112/80  Pulse: 77 76  Resp: 15 16  Temp:  (!) 36.4 C  SpO2: 93% 97%    Last Pain:  Vitals:   01/08/18 1600  TempSrc:   PainSc: [place]

## 2018-01-08 NOTE — Op Note (Signed)
NAME: ALLEX, MADIA MEDICAL RECORD [id number] ACCOUNT [id number] DATE OF BIRTH:Sep 07, 1946 FACILITY: [facility] LOCATION: [facility] PHYSICIAN:Terrence Pizana Kerry Fort, MD  OPERATIVE REPORT  DATE OF PROCEDURE:  01/08/2018  PREOPERATIVE DIAGNOSIS:  Primary osteoarthritis and degenerative joint disease, left hip.  POSTOPERATIVE DIAGNOSIS:  Primary osteoarthritis and degenerative joint disease, left hip.  PROCEDURE:  Left total hip arthroplasty through direct anterior approach.  IMPLANTS:  DePuy Sector Gription acetabular component size 50, size 32+0 polyethylene liner, size 11 Corail femoral component with standard offset, size 32+1 metal head ball.  SURGEON:  Lind Guest. Ninfa Linden, MD  ASSISTANT:  Benita Stabile, PA-C   ANESTHESIA:  General.  ESTIMATED BLOOD LOSS:  900 mL.  ANTIBIOTICS:  2 grams IV Ancef.  COMPLICATIONS:  None.  INDICATIONS:  The patient is a 72 year old female with debilitating arthritis involving her left hip.  Her x-rays show complete loss of the joint space.  Her pain is daily and severe.  She walks with a significant limp.  She has tried and failed all  forms of conservative treatment and we have talked about total hip arthroplasty through direct anterior approach.  She does wish to proceed with this understanding the risk of acute blood loss anemia, nerve and vessel injury, fracture, infection,  dislocation, DVT.  She understands our goals are to decrease pain, improve mobility and an overall improved quality of life.  DESCRIPTION OF PROCEDURE:  After informed consent was obtained, appropriate left hip was marked.  She was brought to the operating room where they sat her up on her stretcher and tried spinal anesthesia.  She has had previous back surgery of the lumbar  spine and they were unsuccessful getting spinal anesthesia and placed her later in the supine position and general anesthesia was obtained.  A Foley catheter was placed as well.  Next, she was placed  supine upon the Hana fracture table with a perineal  post in place and both legs aligned in the skeletal traction device with no traction applied.  Her left operative hip was prepped and draped with DuraPrep and sterile drapes.  A time-out was called.  She was identified as the correct patient and correct  left hip.  We then made an incision just inferior and posterior to the anterior superior iliac spine and carried this obliquely down the leg.  We dissected down to the tensor fascia lata muscle.  Tensor fascia was then divided longitudinally to proceed  with direct anterior approach to the hip.  We identified and cauterized the circumflex vessels.  I then identified the hip capsule, opened the hip capsule in a tight format, finding moderate joint effusion and finding severe osteoarthritis in her left  hip.  We placed Cobra retractors around the medial and lateral femoral neck and made our femoral neck cut with an oscillating saw and completed this with an osteotome.  We placed a corkscrew guide in the femoral head, removed the femoral head in its  entirety and found it to be completely devoid of cartilage.  We then cleaned the acetabulum remnants of the acetabular labrum and other debris.  We began reaming under direct visualization from a size 42 reamer, going up to a size 49 with all reamers  under direct visualization and the last reamer under direct fluoroscopy so we could obtain our depth with reaming by inclination and anteversion.  We did placed the real DePuy Sector Gription acetabular component size 50 without any difficulty and then a  single screw.  We also placed a 32+0  polyethylene liner.  Attention was then turned to the femur.  With the leg externally rotated to 120 degrees extension and adducted, we were able to place a Mueller retractor medially and a Hohmann retractor above  the greater trochanter, released lateral joint capsule and then gained access to the femoral canal using starting  broach.  We then began broaching from size 8 broach, going up to a size 12.  We felt the size 12 was right.  There was definitely a tight  fit distally and she started getting significant anterior medullary bleeding from the femoral canal.  When we went with the size 12 broach, we felt that it was lying appropriately, so that was what we would do with our real size 12 broach.  We placed the  real 12 femoral component and as we were getting down the canal, it certainly got held up, so it was definitely higher than the calcar and I knew there that we would not get it down further and then we would not be able to reduce the hip either.  We  then opened up hip distraction instruments and removed that size 12 femoral component without difficulty and then placed a real size 11.  That did fill the canal and we verified under direct fluoroscopy and did not see any fracture.  We then placed a  real 32+1 metal hip ball and brought the leg back over and up with traction and rotation views of the pelvis and it was stable throughout our arc of internal and external rotation with no shuck and good offset and leg lengths under fluoroscopy.  We then  irrigated the soft tissues with normal saline solution using pulsatile lavage.  We closed the joint capsule with interrupted #1 Ethibond suture followed by #1 Vicryl in the tensor fascia, 0 Vicryl in the deep tissue, 2-0 Vicryl subcutaneous tissue and  interrupted staples on the skin.  Xeroform and Oxycel dressing was applied.  She was awakened, extubated, and taken to the recovery room in stable condition.  All final counts were correct.  There were no complications noted.  Of note, Benita Stabile, PA-C  assisted in the entire case.  His assistance was crucial for facilitating all aspects of this case.  GN/NUANCE  D:01/08/2018 T:01/08/2018 JOB:[id number]

## 2018-01-08 NOTE — Brief Op Note (Signed)
01/08/2018  2:07 PM  PATIENT:  [name]  72 y.o. female  PRE-OPERATIVE DIAGNOSIS:  osteoarthritis left hip  POST-OPERATIVE DIAGNOSIS:  osteoarthritis left hip  PROCEDURE:  Procedure(s): LEFT TOTAL HIP ARTHROPLASTY ANTERIOR APPROACH (Left)  SURGEON:  Surgeon(s) and Role:    Mcarthur Rossetti, MD - Primary  PHYSICIAN ASSISTANT: Benita Stabile, PA-C  ANESTHESIA:   general  EBL:  900 mL   COUNTS:  YES  DICTATION: .Other Dictation: Dictation Number [id number]  PLAN OF CARE: Admit to inpatient   PATIENT DISPOSITION:  PACU - hemodynamically stable.   Delay start of Pharmacological VTE agent (>24hrs) due to surgical blood loss or risk of bleeding: no

## 2018-01-08 NOTE — Anesthesia Postprocedure Evaluation (Signed)
Anesthesia Post Note  Patient: [name] [facility]  Procedure(s) Performed: LEFT TOTAL HIP ARTHROPLASTY ANTERIOR APPROACH (Left Hip)     Anesthesia Type: General    Last Vitals:  Vitals:   01/08/18 1545 01/08/18 1600  BP: (!) 115/55 112/80  Pulse: 77 76  Resp: 15 16  Temp:  (!) 36.4 C  SpO2: 93% 97%    Last Pain:  Vitals:   01/08/18 1600  TempSrc:   PainSc: [place]

## 2018-01-08 NOTE — Anesthesia Procedure Notes (Signed)
Procedure Name: Intubation Date/Time: 01/08/2018 12:33 PM Performed by: Maxwell Caul, CRNA Pre-anesthesia Checklist: Patient identified, Emergency Drugs available, Suction available and Patient being monitored Patient Re-evaluated:Patient Re-evaluated prior to induction Oxygen Delivery Method: Circle system utilized Preoxygenation: Pre-oxygenation with 100% oxygen Induction Type: IV induction Ventilation: Mask ventilation without difficulty Laryngoscope Size: Mac and 3 Grade View: Grade I Tube type: Oral Tube size: 7.5 mm Number of attempts: 1 Airway Equipment and Method: Stylet Placement Confirmation: ETT inserted through vocal cords under direct vision,  positive ETCO2 and breath sounds checked- equal and bilateral Secured at: 21 cm Tube secured with: Tape Dental Injury: Teeth and Oropharynx as per pre-operative assessment

## 2018-01-08 NOTE — Anesthesia Preprocedure Evaluation (Addendum)
Anesthesia Evaluation  Patient identified by MRN, date of birth, ID band Patient awake    Reviewed: Allergy & Precautions, NPO status , Patient's Chart, lab work & pertinent test results  Airway Mallampati: II  TM Distance: >3 FB Neck ROM: Full    Dental no notable dental hx.    Pulmonary neg pulmonary ROS,    Pulmonary exam normal breath sounds clear to auscultation       Cardiovascular Exercise Tolerance: Good hypertension, Normal cardiovascular exam Rhythm:Regular Rate:Normal     Neuro/Psych negative neurological ROS  negative psych ROS   GI/Hepatic negative GI ROS,   Endo/Other  diabetes  Renal/GU negative Renal ROS  negative genitourinary   Musculoskeletal   Abdominal (+) + obese,   Peds negative pediatric ROS (+)  Hematology negative hematology ROS (+)   Anesthesia Other Findings   Reproductive/Obstetrics negative OB ROS                            Anesthesia Physical Anesthesia Plan  ASA: III  Anesthesia Plan: General   Post-op Pain Management:    Induction: Intravenous  PONV Risk Score and Plan: Treatment may vary due to age or medical condition  Airway Management Planned: Oral ETT  Additional Equipment:   Intra-op Plan:   Post-operative Plan: Extubation in OR  Informed Consent: I have reviewed the patients History and Physical, chart, labs and discussed the procedure including the risks, benefits and alternatives for the proposed anesthesia with the patient or authorized representative who has indicated his/her understanding and acceptance.   Dental advisory given  Plan Discussed with: CRNA  Anesthesia Plan Comments:      Lab Results  Component Value Date   CREATININE 0.88 01/06/2018   BUN 32 (H) 01/06/2018   NA 142 01/06/2018   K 3.9 01/06/2018   CL 105 01/06/2018   CO2 28 01/06/2018     Lab Results  Component Value Date   WBC 10.5 01/06/2018    HGB 14.2 01/06/2018   HCT 43.5 01/06/2018   MCV 100.9 (H) 01/06/2018   PLT 268 01/06/2018    Anesthesia Quick Evaluation

## 2018-01-08 NOTE — Transfer of Care (Signed)
Immediate Anesthesia Transfer of Care Note  Patient: ALLANNA BRESEE  Procedure(s) Performed: LEFT TOTAL HIP ARTHROPLASTY ANTERIOR APPROACH (Left Hip)  Patient Location: PACU  Anesthesia Type:General  Level of Consciousness: awake, alert  and oriented  Airway & Oxygen Therapy: Patient Spontanous Breathing and Patient connected to face mask oxygen  Post-op Assessment: Report given to RN and Post -op Vital signs reviewed and stable  Post vital signs: Reviewed and stable  Last Vitals:  Vitals Value Taken Time  BP 131/84 01/08/2018  2:30 PM  Temp 36.6 C 01/08/2018  2:28 PM  Pulse 81 01/08/2018  2:31 PM  Resp 13 01/08/2018  2:31 PM  SpO2 98 % 01/08/2018  2:31 PM  Vitals shown include unvalidated device data.  Last Pain:  Vitals:   01/08/18 1100  TempSrc:   PainSc: 4       Patients Stated Pain Goal: 4 (24/46/95 0722)  Complications: No apparent anesthesia complications

## 2018-01-08 NOTE — Anesthesia Postprocedure Evaluation (Signed)
Anesthesia Post Note  Patient: [name]  Procedure(s) Performed: LEFT TOTAL HIP ARTHROPLASTY ANTERIOR APPROACH (Left Hip)     Patient location during evaluation: PACU Anesthesia Type: General Level of consciousness: awake and alert Pain management: pain level controlled Vital Signs Assessment: post-procedure vital signs reviewed and stable Respiratory status: spontaneous breathing, nonlabored ventilation, respiratory function stable and patient connected to nasal cannula oxygen Cardiovascular status: blood pressure returned to baseline and stable Postop Assessment: no apparent nausea or vomiting Anesthetic complications: no    Last Vitals:  Vitals:   01/08/18 1545 01/08/18 1600  BP: (!) 115/55 112/80  Pulse: 77 76  Resp: 15 16  Temp:  (!) 36.4 C  SpO2: 93% 97%    Last Pain:  Vitals:   01/08/18 1600  TempSrc:   PainSc: [place] A [place]

## 2018-01-08 NOTE — Addendum Note (Signed)
Addendum  created 01/08/18 1618 by Barnet Glasgow, MD   Sign clinical note

## 2018-01-08 NOTE — Progress Notes (Signed)
PHARMACIST - PHYSICIAN ORDER COMMUNICATION  CONCERNING: P&T Medication Policy on Herbal Medications  DESCRIPTION:  This patient's order for:  Chromium, CoQ 10  has been noted.  This product(s) is classified as an "herbal" or natural product. Due to a lack of definitive safety studies or FDA approval, nonstandard manufacturing practices, plus the potential risk of unknown drug-drug interactions while on inpatient medications, the Pharmacy and Therapeutics Committee does not permit the use of "herbal" or natural products of this type within [facility].   ACTION TAKEN: The pharmacy department is unable to verify this order at this time and your patient has been informed of this safety policy. Please reevaluate patient's clinical condition at discharge and address if the herbal or natural product(s) should be resumed at that time.  Peggyann Juba, PharmD, BCPS 01/08/2018 4:37 PM

## 2018-01-09 LAB — BASIC METABOLIC PANEL
Anion gap: 8 (ref 5–15)
BUN: 20 mg/dL (ref 6–20)
CO2: 25 mmol/L (ref 22–32)
Calcium: 8.2 mg/dL — ABNORMAL LOW (ref 8.9–10.3)
Chloride: 102 mmol/L (ref 101–111)
Creatinine, Ser: 0.87 mg/dL (ref 0.44–1.00)
GFR calc Af Amer: >60 mL/min (ref >60)
GFR calc non Af Amer: >60 mL/min (ref >60)
Glucose, Bld: 153 mg/dL — ABNORMAL HIGH (ref 65–99)
Potassium: 3.9 mmol/L (ref 3.5–5.1)
Sodium: 135 mmol/L (ref 135–145)

## 2018-01-09 LAB — CBC
HCT: 27.7 % — ABNORMAL LOW (ref 36.0–46.0)
Hemoglobin: 9.1 g/dL — ABNORMAL LOW (ref 12.0–15.0)
MCH: 33.1 pg (ref 26.0–34.0)
MCHC: 32.9 g/dL (ref 30.0–36.0)
MCV: 100.7 fL — AB (ref 78.0–100.0)
PLATELETS: 195 10*3/uL (ref 150–400)
RBC: 2.75 MIL/uL — ABNORMAL LOW (ref 3.87–5.11)
RDW: 13.9 % (ref 11.5–15.5)
WBC: 10.7 10*3/uL — AB (ref 4.0–10.5)

## 2018-01-09 NOTE — Care Management Note (Addendum)
Case Management Note  Patient Details  Name: [name] MRN: [id number] Date of Birth: [birth year]  Subjective/Objective:    Left THA                Action/Plan: 01/09/2018 NCM spoke to pt and offered choice for [facility]. Pt agreeable to [facility]. Will need RW and 3n1 bedside commode for home. Contacted [facility] for DME to be delivered to room prior to dc.   01/10/2018 Scheduled dc to SNF. CSW following.   Expected Discharge Date:                Expected Discharge Plan:  [place]  In-House Referral:  NA  Discharge planning Services  CM Consult  Post Acute Care Choice:  Home Health Choice offered to:  Patient  DME Arranged:  3-N-1, Walker rolling DME Agency:  [place]:  PT [place]:  [facility]  Status of Service:  Completed, signed off  If discussed at [employer], dates discussed:    Additional Comments:  Erenest Rasher, RN 01/09/2018, 10:56 AM

## 2018-01-09 NOTE — Progress Notes (Signed)
Subjective: 1 Day Post-Op Procedure(s) (LRB): LEFT TOTAL HIP ARTHROPLASTY ANTERIOR APPROACH (Left) Patient reports pain as moderate.  Doing ok this am.  No lightheadedness/dizziness.  Objective: Vital signs in last 24 hours: Temp:  [97.5 F (36.4 C)-98.4 F (36.9 C)] 97.9 F (36.6 C) (05/04 0525) Pulse Rate:  [59-89] 86 (05/04 0525) Resp:  [14-18] 17 (05/04 0525) BP: (93-174)/(50-108) 123/67 (05/04 0525) SpO2:  [93 %-99 %] 99 % (05/04 0525) Weight:  [180 lb (81.6 kg)] 180 lb (81.6 kg) (05/03 1049)  Intake/Output from previous day: 05/03 0701 - 05/04 0700 In: 4431.3 [P.O.:640; I.V.:3181.3; IV Piggyback:610] Out: 2300 [Urine:1400; Blood:900] Intake/Output this shift: No intake/output data recorded.  Recent Labs    01/06/18 1411 01/09/18 0514  HGB 14.2 9.1*   Recent Labs    01/06/18 1411 01/09/18 0514  WBC 10.5 10.7*  RBC 4.31 2.75*  HCT 43.5 27.7*  PLT 268 195   Recent Labs    01/06/18 1411 01/09/18 0514  NA 142 135  K 3.9 3.9  CL 105 102  CO2 28 25  BUN 32* 20  CREATININE 0.88 0.87  GLUCOSE 180* 153*  CALCIUM 9.1 8.2*   No results for input(s): LABPT, INR in the last 72 hours.  Neurologically intact Neurovascular intact Sensation intact distally Intact pulses distally Dorsiflexion/Plantar flexion intact Incision: scant drainage No cellulitis present Compartment soft    Assessment/Plan: 1 Day Post-Op Procedure(s) (LRB): LEFT TOTAL HIP ARTHROPLASTY ANTERIOR APPROACH (Left) Advance diet Up with therapy D/C IV fluids  WBAT LLE-anterior hip precautions ABLA-mild and stable Unsure of D/C dispo at this time.  Patient states that she does not have anyone at home.    Aundra Dubin 01/09/2018, 9:34 AM

## 2018-01-09 NOTE — Progress Notes (Signed)
   01/09/18 1500  PT Visit Information  Last PT Received On 01/09/18; exercise focus session, pt cooperative but distracted by her "stuff" that security retrieved from her car;  Assistance Needed +2  History of Present Illness s/p L DA THA; PMH: confusion, DM, back surgery, mood disorder  Subjective Data  Patient Stated Goal none stated  Precautions  Precautions Fall  Restrictions  Weight Bearing Restrictions No  Other Position/Activity Restrictions WBAT  Pain Assessment  Pain Assessment 0-10  Pain Score 5  Pain Location L hip  Pain Descriptors / Indicators Sore  Pain Intervention(s) Monitored during session;Ice applied  Cognition  Arousal/Alertness Awake/alert  Behavior During Therapy WFL for tasks assessed/performed  Area of Impairment Attention;Following commands;Problem solving;Safety/judgement  Current Attention Level Sustained  Following Commands Follows one step commands consistently;Follows multi-step commands inconsistently  Problem Solving Difficulty sequencing;Requires verbal cues;Requires tactile cues  General Comments frequent redirection needed to stay on task  Bed Mobility  General bed mobility comments deferred OOB, pt just back to bed with staff  Total Joint Exercises  Ankle Circles/Pumps AROM;Strengthening;Both;10 reps  Quad Sets AROM;Strengthening;10 reps;Both  Heel Slides AAROM;AROM;Both;10 reps  Hip ABduction/ADduction AAROM;AROM;Both;10 reps  PT - End of Session  Activity Tolerance Patient tolerated treatment well  Patient left in bed;with call bell/phone within reach;with bed alarm set   PT - Assessment/Plan  PT Plan Current plan remains appropriate  PT Visit Diagnosis Difficulty in walking, not elsewhere classified (R26.2)  PT Frequency (ACUTE ONLY) 7X/week  Follow Up Recommendations SNF  PT equipment None recommended by PT  AM-PAC PT "6 Clicks" Daily Activity Outcome Measure  Difficulty turning over in bed (including adjusting bedclothes, sheets and  blankets)? 1  Difficulty moving from lying on back to sitting on the side of the bed?  1  Difficulty sitting down on and standing up from a chair with arms (e.g., wheelchair, bedside commode, etc,.)? 1  Help needed moving to and from a bed to chair (including a wheelchair)? 3  Help needed walking in hospital room? 3  Help needed climbing 3-5 steps with a railing?  2  6 Click Score 11  Mobility G Code  CL  PT Goal Progression  Progress towards PT goals Progressing toward goals  Acute Rehab PT Goals  PT Goal Formulation With patient  Time For Goal Achievement 01/23/18  Potential to Achieve Goals Good  PT Time Calculation  PT Start Time (ACUTE ONLY) 1403  PT Stop Time (ACUTE ONLY) 1418  PT Time Calculation (min) (ACUTE ONLY) 15 min  PT General Charges  $$ ACUTE PT VISIT 1 Visit  PT Treatments  $Therapeutic Exercise 8-22 mins

## 2018-01-09 NOTE — Evaluation (Signed)
Physical Therapy Evaluation Patient Details Name: [name] MRN: [id number] DOB: 1945/11/02 Today's Date: 01/09/2018   History of Present Illness  s/p L DA THA; PMH: confusion, DM, back surgery, mood disorder  Clinical Impression  Pt is s/p THA resulting in the deficits listed below (see PT Problem List).  Pt states she is independent at her baseline however she is currently unsafe with mobility, requiring constant cues for safety and to stay on task; recommend SNF post acute; will continue to follow   Pt will benefit from skilled PT to increase their independence and safety with mobility to allow discharge to the venue listed below.      Follow Up Recommendations SNF    Equipment Recommendations  None recommended by PT    Recommendations for Other Services       Precautions / Restrictions Precautions Precautions: Fall Restrictions Weight Bearing Restrictions: No      Mobility  Bed Mobility Overal bed mobility: Needs Assistance Bed Mobility: Supine to Sit     Supine to sit: Mod assist     General bed mobility comments: OOB with OT/in chair on arrival   Transfers Overall transfer level: Needs assistance Equipment used: Rolling walker (2 wheeled) Transfers: Sit to/from Stand Sit to Stand: Min assist         General transfer comment: extra time, assist to rise and stabilize; cues for UE/LE placement   Ambulation/Gait Ambulation/Gait assistance: Min assist;+2 physical assistance Ambulation Distance (Feet): 25 Feet Assistive device: Rolling walker (2 wheeled) Gait Pattern/deviations: Step-to pattern     General Gait Details:  pt requires near constant cues for sequence, RW safety and attention to task  Stairs            Wheelchair Mobility    Modified Rankin (Stroke Patients Only)       Balance                                             Pertinent Vitals/Pain Pain Assessment: 0-10 Pain Score: 4  Pain Location: L  hip Pain Descriptors / Indicators: Sore Pain Intervention(s): Limited activity within patient's tolerance;Monitored during session;Premedicated before session;Repositioned;Ice applied    Home Living Family/patient expects to be discharged to:: Skilled nursing facility Living Arrangements: Alone                    Prior Function Level of Independence: Independent               Hand Dominance        Extremity/Trunk Assessment   Upper Extremity Assessment Upper Extremity Assessment: Defer to OT evaluation;Generalized weakness    Lower Extremity Assessment Lower Extremity Assessment: LLE deficits/detail LLE Deficits / Details: ankle WFL, knee and hip grossly 2+/5       Communication   Communication: No difficulties  Cognition Arousal/Alertness: Awake/alert Behavior During Therapy: WFL for tasks assessed/performed Overall Cognitive Status: Impaired/Different from baseline Area of Impairment: Attention;Following commands;Problem solving;Safety/judgement                   Current Attention Level: Sustained   Following Commands: Follows one step commands consistently;Follows multi-step commands inconsistently Safety/Judgement: Decreased awareness of safety;Decreased awareness of deficits   Problem Solving: Difficulty sequencing;Requires verbal cues;Requires tactile cues General Comments: pt with odd comments; follows basic commands--cues to stay on task at times; cues for safety  General Comments      Exercises     Assessment/Plan    PT Assessment Patient needs continued PT services  PT Problem List Decreased strength;Decreased activity tolerance;Decreased mobility;Decreased knowledge of use of DME;Decreased range of motion;Pain       PT Treatment Interventions Gait training;DME instruction;Therapeutic activities;Therapeutic exercise;Functional mobility training;Patient/family education    PT Goals (Current goals can be found in the Care  Plan section)  Acute Rehab PT Goals Patient Stated Goal: none stated PT Goal Formulation: With patient Time For Goal Achievement: 01/23/18 Potential to Achieve Goals: Good    Frequency 7X/week   Barriers to discharge        Co-evaluation               AM-PAC PT "6 Clicks" Daily Activity  Outcome Measure Difficulty turning over in bed (including adjusting bedclothes, sheets and blankets)?: Unable Difficulty moving from lying on back to sitting on the side of the bed? : Unable Difficulty sitting down on and standing up from a chair with arms (e.g., wheelchair, bedside commode, etc,.)?: Unable Help needed moving to and from a bed to chair (including a wheelchair)?: A Little Help needed walking in hospital room?: A Little Help needed climbing 3-5 steps with a railing? : A Lot 6 Click Score: 11    End of Session Equipment Utilized During Treatment: Gait belt Activity Tolerance: Patient tolerated treatment well Patient left: with call bell/phone within reach;with chair alarm set;in chair   PT Visit Diagnosis: Difficulty in walking, not elsewhere classified (R26.2)    Time: 6222-9798 PT Time Calculation (min) (ACUTE ONLY): 22 min   Charges:   PT Evaluation $PT Eval Low Complexity: 1 Low     PT G CodesKenyon Ana, PT Pager: [phone] 01/09/2018   Kenyon Ana 01/09/2018, 1:21 PM

## 2018-01-09 NOTE — Evaluation (Signed)
Occupational Therapy Evaluation Patient Details Name: [name] MRN: [id number] DOB: Sep 30, 1945 Today's Date: 01/09/2018    History of Present Illness s/p L DA THA; PMH: confusion, DM, back surgery, mood disorder   Clinical Impression   This 72 year old female was admitted for the above.  She does not have 24/7 assistance and will need snf for rehab prior to home.  She needs up to mod A for mobility and up to max A for LB adls. Goals are for min guard to min A in acute setting    Follow Up Recommendations  SNF(pt does not have 24/7)    Equipment Recommendations  None recommended by OT(has 3:1)    Recommendations for Other Services       Precautions / Restrictions Precautions Precautions: Fall Restrictions Weight Bearing Restrictions: No      Mobility Bed Mobility Overal bed mobility: Needs Assistance Bed Mobility: Supine to Sit     Supine to sit: Mod assist     General bed mobility comments: assist for legs and trunk; cues for sequence  Transfers Overall transfer level: Needs assistance Equipment used: Rolling walker (2 wheeled) Transfers: Sit to/from Stand Sit to Stand: Min assist;Mod assist         General transfer comment: extra time, assist to rise and stabilize; cues for UE/LE placement    Balance                                           ADL either performed or assessed with clinical judgement   ADL Overall ADL's : Needs assistance/impaired Eating/Feeding: Independent   Grooming: Set up;Sitting   Upper Body Bathing: Set up;Supervision/ safety;Sitting Upper Body Bathing Details (indicate cue type and reason): cues for thoroughness Lower Body Bathing: Moderate assistance;Sit to/from stand   Upper Body Dressing : Minimal assistance;Sitting   Lower Body Dressing: Maximal assistance;Sit to/from stand   Toilet Transfer: Moderate assistance;Stand-pivot;BSC;RW             General ADL Comments: performed ADL from EOB and  transferred to chair     Vision         Perception     Praxis      Pertinent Vitals/Pain Pain Assessment: 0-10 Pain Score: 4  Pain Location: L hip Pain Descriptors / Indicators: Sore Pain Intervention(s): Limited activity within patient's tolerance;Monitored during session;Premedicated before session;Repositioned;Ice applied     Hand Dominance     Extremity/Trunk Assessment Upper Extremity Assessment Upper Extremity Assessment: Generalized weakness           Communication Communication Communication: No difficulties   Cognition Arousal/Alertness: Awake/alert Behavior During Therapy: WFL for tasks assessed/performed Overall Cognitive Status: No family/caregiver present to determine baseline cognitive functioning                                 General Comments: pt with odd comments; follows basic commands--cues to stay on task at times; cues for safety   General Comments       Exercises     Shoulder Instructions      Home Living Family/patient expects to be discharged to:: Skilled nursing facility Living Arrangements: Alone  Prior Functioning/Environment Level of Independence: Independent                 OT Problem List: Decreased strength;Decreased activity tolerance;Impaired balance (sitting and/or standing);Decreased knowledge of use of DME or AE;Pain;Decreased cognition      OT Treatment/Interventions: Self-care/ADL training;DME and/or AE instruction;Therapeutic activities;Patient/family education;Balance training    OT Goals(Current goals can be found in the care plan section) Acute Rehab OT Goals Patient Stated Goal: none stated OT Goal Formulation: With patient Time For Goal Achievement: 01/23/18 Potential to Achieve Goals: Good ADL Goals Pt Will Perform Lower Body Bathing: with min assist;with adaptive equipment;sit to/from stand Pt Will Perform Lower Body Dressing: with  min assist;with adaptive equipment;sit to/from stand Pt Will Transfer to Toilet: with min guard assist;ambulating;bedside commode Additional ADL Goal #1: pt will perform bed mobility with min A in preparation for adls  OT Frequency: Min 2X/week   Barriers to D/C:            Co-evaluation              AM-PAC PT "6 Clicks" Daily Activity     Outcome Measure Help from another person eating meals?: None Help from another person taking care of personal grooming?: A Little Help from another person toileting, which includes using toliet, bedpan, or urinal?: A Lot Help from another person bathing (including washing, rinsing, drying)?: A Lot Help from another person to put on and taking off regular upper body clothing?: A Little Help from another person to put on and taking off regular lower body clothing?: A Lot 6 Click Score: 16   End of Session    Activity Tolerance: Patient limited by fatigue Patient left: in chair;with call bell/phone within reach;with chair alarm set  OT Visit Diagnosis: Pain Pain - Right/Left: Left Pain - part of body: Hip                Time: 1610-9604 OT Time Calculation (min): 39 min Charges:  OT General Charges $OT Visit: 1 Visit OT Evaluation $OT Eval Low Complexity: 1 Low OT Treatments $Self Care/Home Management : 23-37 mins G-Codes:     [name], OTR/L [phone] 01/09/2018  [name] 01/09/2018, 12:14 PM
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
## 2018-01-09 NOTE — NC FL2 (Signed)
[place] LEVEL OF CARE SCREENING TOOL     IDENTIFICATION  Patient Name: [name] Birthdate: Jun 07, 1946 Sex: female Admission Date (Current Location): 01/08/2018  [facility] and [place] Number:  Herbalist and Address:  [facility],  [place], [place]      Provider Number: [id number]  Attending Physician Name and Address:  Mcarthur Rossetti  Relative Name and Phone Number:       Current Level of Care: Hospital Recommended Level of Care: [place] Prior Approval Number:    Date Approved/Denied:   PASRR Number: [id number]  Discharge Plan:      Current Diagnoses: Patient Active Problem List   Diagnosis Date Noted  . Unilateral primary osteoarthritis, left hip 01/08/2018  . Status post total replacement of left hip 01/08/2018  . Left hip pain 12/09/2017  . Leg ulcer ([place]) 09/29/2017  . Sore throat 09/29/2017  . Contusion of left lower leg 09/18/2017  . Pain syndrome, chronic 03/13/2017  . Facet arthropathy, lumbar 03/13/2017  . Advance directive discussed with patient 01/15/2016  . Spinal stenosis of lumbar region with neurogenic claudication 11/07/2015  . Other intervertebral disc degeneration, lumbar region 06/01/2015  . Chronic back pain 03/29/2015  . Preventative health care 01/10/2015  . Osteoarthritis, hip, bilateral 11/28/2014  . Peripheral neuropathy 11/28/2014  . Type 2 diabetes, controlled, with neuropathy ([place]) 01/20/2014  . Episodic mood disorder ([place]) 01/20/2014  . IBS (irritable bowel syndrome) 12/27/2012  . Essential hypertension, benign 08/06/2010    Orientation RESPIRATION BLADDER Height & Weight     Self, Time, Place, Situation  Normal Continent Weight: 180 lb (81.6 kg) Height:  5' 1.5" (156.2 cm)  BEHAVIORAL SYMPTOMS/MOOD NEUROLOGICAL BOWEL NUTRITION STATUS      Continent Diet:See Discharge Summary  AMBULATORY STATUS COMMUNICATION OF NEEDS Skin   Extensive  Assist Verbally Surgical wounds(Hip )                       Personal Care Assistance Level of Assistance  Bathing, Feeding, Dressing Bathing Assistance: Limited assistance Feeding assistance: Independent Dressing Assistance: Limited assistance     Functional Limitations Info  Sight, Hearing, Speech Sight Info: Adequate Hearing Info: Adequate Speech Info: Adequate    SPECIAL CARE FACTORS FREQUENCY  PT (By licensed PT), OT (By licensed OT)     PT Frequency: 7x/week  OT Frequency: 7x/week             Contractures Contractures Info: Not present    Additional Factors Info  Code Status, Allergies Code Status Info: Fullcode  Allergies Info: Allergies: Acyclovir And Related           Current Medications (01/09/2018):  This is the current hospital active medication list Current Facility-Administered Medications  Medication Dose Route Frequency Provider Last Rate Last Dose  . 0.9 %  sodium chloride infusion   Intravenous Continuous Mcarthur Rossetti, MD 75 mL/hr at 01/08/18 1755    . acetaminophen (TYLENOL) tablet 325-650 mg  325-650 mg Oral Q6H PRN Mcarthur Rossetti, MD      . alum & mag hydroxide-simeth (MAALOX/MYLANTA) 200-200-20 MG/5ML suspension 30 mL  30 mL Oral Q4H PRN Mcarthur Rossetti, MD      . aspirin chewable tablet 81 mg  81 mg Oral BID Mcarthur Rossetti, MD   81 mg at 01/09/18 1025  . diphenhydrAMINE (BENADRYL) 12.5 MG/5ML elixir 12.5-25 mg  12.5-25 mg Oral Q4H PRN Mcarthur Rossetti, MD      .  docusate sodium (COLACE) capsule 100 mg  100 mg Oral BID Mcarthur Rossetti, MD   100 mg at 01/08/18 2150  . DULoxetine (CYMBALTA) DR capsule 60 mg  60 mg Oral BID Mcarthur Rossetti, MD   60 mg at 01/09/18 1025  . gabapentin (NEURONTIN) capsule 600 mg  600 mg Oral BID WC Mcarthur Rossetti, MD   600 mg at 01/09/18 5053  . gabapentin (NEURONTIN) capsule 900 mg  900 mg Oral QHS Mcarthur Rossetti, MD   900 mg at 01/08/18  2150  . HYDROmorphone (DILAUDID) injection 0.5-1 mg  0.5-1 mg Intravenous Q4H PRN Mcarthur Rossetti, MD   1 mg at 01/09/18 1026  . menthol-cetylpyridinium (CEPACOL) lozenge 3 mg  1 lozenge Oral PRN Mcarthur Rossetti, MD       Or  . phenol (CHLORASEPTIC) mouth spray 1 spray  1 spray Mouth/Throat PRN Mcarthur Rossetti, MD      . metFORMIN (GLUCOPHAGE-XR) 24 hr tablet 500 mg  500 mg Oral Q breakfast Mcarthur Rossetti, MD   500 mg at 01/09/18 9767  . methocarbamol (ROBAXIN) tablet 500 mg  500 mg Oral Q6H PRN Mcarthur Rossetti, MD   500 mg at 01/09/18 0124   Or  . methocarbamol (ROBAXIN) 500 mg in dextrose 5 % 50 mL IVPB  500 mg Intravenous Q6H PRN Mcarthur Rossetti, MD   Stopped at 01/08/18 1524  . metoCLOPramide (REGLAN) tablet 5-10 mg  5-10 mg Oral Q8H PRN Mcarthur Rossetti, MD       Or  . metoCLOPramide (REGLAN) injection 5-10 mg  5-10 mg Intravenous Q8H PRN Mcarthur Rossetti, MD      . multivitamin with minerals tablet 1 tablet  1 tablet Oral Daily Mcarthur Rossetti, MD      . ondansetron [facility]) tablet 4 mg  4 mg Oral Q6H PRN Mcarthur Rossetti, MD       Or  . ondansetron [facility]) injection 4 mg  4 mg Intravenous Q6H PRN Mcarthur Rossetti, MD      . oxyCODONE (Oxy IR/ROXICODONE) immediate release tablet 10-15 mg  10-15 mg Oral Q4H PRN Mcarthur Rossetti, MD      . oxyCODONE (Oxy IR/ROXICODONE) immediate release tablet 5-10 mg  5-10 mg Oral Q4H PRN Mcarthur Rossetti, MD   5 mg at 01/09/18 3419  . pantoprazole (PROTONIX) EC tablet 40 mg  40 mg Oral Daily Mcarthur Rossetti, MD   40 mg at 01/09/18 1025  . polyethylene glycol (MIRALAX / GLYCOLAX) packet 17 g  17 g Oral Daily PRN Mcarthur Rossetti, MD      . vitamin C (ASCORBIC ACID) tablet 500 mg  500 mg Oral Daily Mcarthur Rossetti, MD      . zinc sulfate capsule 220 mg  220 mg Oral Daily Mcarthur Rossetti, MD         Discharge  Medications: Please see discharge summary for a list of discharge medications.  Relevant Imaging Results:  Relevant Lab Results:   Additional Information SSN: [place] D[place] Xia, [place]

## 2018-01-09 NOTE — Discharge Instructions (Signed)

## 2018-01-10 ENCOUNTER — Encounter (INDEPENDENT_AMBULATORY_CARE_PROVIDER_SITE_OTHER): Payer: Self-pay | Admitting: Orthopaedic Surgery

## 2018-01-10 LAB — CBC
HEMATOCRIT: 26.4 % — AB (ref 36.0–46.0)
HEMOGLOBIN: 8.9 g/dL — AB (ref 12.0–15.0)
MCH: 33.7 pg (ref 26.0–34.0)
MCHC: 33.7 g/dL (ref 30.0–36.0)
MCV: 100 fL (ref 78.0–100.0)
Platelets: 184 10*3/uL (ref 150–400)
RBC: 2.64 MIL/uL — AB (ref 3.87–5.11)
RDW: 14 % (ref 11.5–15.5)
WBC: 10.1 10*3/uL (ref 4.0–10.5)

## 2018-01-10 MED ORDER — LISINOPRIL 20 MG PO TABS
20.0000 mg | ORAL_TABLET | Freq: Every day | ORAL | Status: DC
  Administered 2018-01-11 – 2018-01-12 (×2): 20 mg via ORAL
  Filled 2018-01-10 (×2): qty 1

## 2018-01-10 MED ORDER — HYDROCHLOROTHIAZIDE 25 MG PO TABS
25.0000 mg | ORAL_TABLET | Freq: Every day | ORAL | Status: DC
  Administered 2018-01-11 – 2018-01-12 (×2): 25 mg via ORAL
  Filled 2018-01-10 (×2): qty 1

## 2018-01-10 NOTE — Progress Notes (Signed)
   01/10/18 1500  PT Visit Information  Last PT Received On 01/10/18; pt just back to bed; exercise focused session  History of Present Illness s/p L DA THA; PMH: confusion, DM, back surgery, mood disorder  Subjective Data  Patient Stated Goal none stated  Precautions  Precautions Fall  Restrictions  Weight Bearing Restrictions No  Other Position/Activity Restrictions WBAT  Pain Assessment  Pain Assessment 0-10  Pain Score 3  Pain Location L hip  Pain Descriptors / Indicators Sore  Pain Intervention(s) Limited activity within patient's tolerance  Cognition  Arousal/Alertness Awake/alert  Behavior During Therapy WFL for tasks assessed/performed  General Comments frequent redirection needed to stay on task  Total Joint Exercises  Ankle Circles/Pumps AROM;Strengthening;Both;10 reps  Quad Sets AROM;Strengthening;10 reps;Both  Short [place];Left;10 reps  Heel Slides AAROM;AROM;Both;10 reps  Hip ABduction/ADduction AAROM;AROM;Both;10 reps  PT - End of Session  Activity Tolerance Patient tolerated treatment well  Patient left in bed;with call bell/phone within reach;with bed alarm set   PT - Assessment/Plan  PT Plan Current plan remains appropriate  PT Visit Diagnosis Difficulty in walking, not elsewhere classified (R26.2)  PT Frequency (ACUTE ONLY) 7X/week  Follow Up Recommendations SNF  PT equipment None recommended by PT  AM-PAC PT "6 Clicks" Daily Activity Outcome Measure  Difficulty turning over in bed (including adjusting bedclothes, sheets and blankets)? 1  Difficulty moving from lying on back to sitting on the side of the bed?  1  Difficulty sitting down on and standing up from a chair with arms (e.g., wheelchair, bedside commode, etc,.)? 1  Help needed moving to and from a bed to chair (including a wheelchair)? 3  Help needed walking in hospital room? 3  Help needed climbing 3-5 steps with a railing?  2  6 Click Score 11  Mobility G Code  CL  PT Goal  Progression  Progress towards PT goals Progressing toward goals  Acute Rehab PT Goals  PT Goal Formulation With patient  Time For Goal Achievement 01/23/18  Potential to Achieve Goals Good  PT Time Calculation  PT Start Time (ACUTE ONLY) 1501  PT Stop Time (ACUTE ONLY) 1520  PT Time Calculation (min) (ACUTE ONLY) 19 min  PT General Charges  $$ ACUTE PT VISIT 1 Visit  PT Treatments  $Therapeutic Exercise 8-22 mins

## 2018-01-10 NOTE — Progress Notes (Signed)
CSW met with patient at bedside and gave SNF offers that are available so far. Patient may have additional offers on Monday morning. Patient will need [facility] authorization before admitting to facility. CSW to follow up with patient for choice and to start auth.  Estanislado Emms, [place]

## 2018-01-10 NOTE — Progress Notes (Signed)
Subjective: 2 Days Post-Op Procedure(s) (LRB): LEFT TOTAL HIP ARTHROPLASTY ANTERIOR APPROACH (Left) Patient reports pain as mild.    Objective: Vital signs in last 24 hours: Temp:  [97.9 F (36.6 C)-99.5 F (37.5 C)] 97.9 F (36.6 C) (05/05 0556) Pulse Rate:  [77-88] 77 (05/05 0556) Resp:  [15-16] 15 (05/05 0556) BP: (102-156)/(60-78) 156/78 (05/05 0556) SpO2:  [91 %-96 %] 96 % (05/05 0556)  Intake/Output from previous day: 05/04 0701 - 05/05 0700 In: 360 [P.O.:360] Out: 6250 [Urine:6250] Intake/Output this shift: Total I/O In: 240 [P.O.:240] Out: 4000 [Urine:4000]  Recent Labs    01/09/18 0514 01/10/18 0517  HGB 9.1* 8.9*   Recent Labs    01/09/18 0514 01/10/18 0517  WBC 10.7* 10.1  RBC 2.75* 2.64*  HCT 27.7* 26.4*  PLT 195 184   Recent Labs    01/09/18 0514  NA 135  K 3.9  CL 102  CO2 25  BUN 20  CREATININE 0.87  GLUCOSE 153*  CALCIUM 8.2*   No results for input(s): LABPT, INR in the last 72 hours.  Neurologically intact Neurovascular intact Sensation intact distally Intact pulses distally Dorsiflexion/Plantar flexion intact Incision: dressing C/D/I No cellulitis present Compartment soft    Assessment/Plan: 2 Days Post-Op Procedure(s) (LRB): LEFT TOTAL HIP ARTHROPLASTY ANTERIOR APPROACH (Left) Advance diet Up with therapy  WBAT LLE-anterior precautions ABLA-mild and stable Will restart BP meds   [name] 01/10/2018, 11:41 AM

## 2018-01-10 NOTE — Progress Notes (Signed)
Physical Therapy Treatment Patient Details Name: [name] MRN: [id number] DOB: [birth year] Today's Date: 01/10/2018    History of Present Illness s/p L DA THA; PMH: confusion, DM, back surgery, mood disorder    PT Comments    Pt progressing; continue recommend SNF post acute  Follow Up Recommendations  SNF     Equipment Recommendations  None recommended by PT    Recommendations for Other Services       Precautions / Restrictions Precautions Precautions: Fall Restrictions Weight Bearing Restrictions: No Other Position/Activity Restrictions: WBAT    Mobility  Bed Mobility Overal bed mobility: Needs Assistance Bed Mobility: Supine to Sit     Supine to sit: Mod assist     General bed mobility comments: assist with trunk and LLE, incr time, cues for sequence  Transfers Overall transfer level: Needs assistance Equipment used: Rolling walker (2 wheeled) Transfers: Sit to/from Stand Sit to Stand: Min assist         General transfer comment: extra time, assist to rise and stabilize; cues for UE/LE placement   Ambulation/Gait Ambulation/Gait assistance: Min assist;+2 safety/equipment Ambulation Distance (Feet): 75 Feet Assistive device: Rolling walker (2 wheeled) Gait Pattern/deviations: Step-to pattern;Step-through pattern;Trunk flexed     General Gait Details:  pt requires near constant cues for sequence, RW safety and attention to task   Stairs             Wheelchair Mobility    Modified Rankin (Stroke Patients Only)       Balance                                            Cognition Arousal/Alertness: Awake/alert Behavior During Therapy: WFL for tasks assessed/performed Overall Cognitive Status: Impaired/Different from baseline Area of Impairment: Attention;Following commands;Problem solving;Safety/judgement                   Current Attention Level: Sustained   Following Commands: Follows one step  commands consistently;Follows multi-step commands inconsistently Safety/Judgement: Decreased awareness of safety;Decreased awareness of deficits   Problem Solving: Difficulty sequencing;Requires verbal cues;Requires tactile cues General Comments: frequent redirection needed to stay on task      Exercises      General Comments        Pertinent Vitals/Pain Pain Assessment: 0-10 Pain Score: 3  Pain Location: L hip Pain Descriptors / Indicators: Sore Pain Intervention(s): Limited activity within patient's tolerance;Monitored during session;Premedicated before session;Ice applied    Home Living                      Prior Function            PT Goals (current goals can now be found in the care plan section) Acute Rehab PT Goals Patient Stated Goal: none stated PT Goal Formulation: With patient Time For Goal Achievement: 01/23/18 Potential to Achieve Goals: Good Progress towards PT goals: Progressing toward goals    Frequency    7X/week      PT Plan Current plan remains appropriate    Co-evaluation              AM-PAC PT "6 Clicks" Daily Activity  Outcome Measure  Difficulty turning over in bed (including adjusting bedclothes, sheets and blankets)?: Unable Difficulty moving from lying on back to sitting on the side of the bed? : Unable Difficulty sitting down on and standing  up from a chair with arms (e.g., wheelchair, bedside commode, etc,.)?: Unable Help needed moving to and from a bed to chair (including a wheelchair)?: A Little Help needed walking in hospital room?: A Little Help needed climbing 3-5 steps with a railing? : A Lot 6 Click Score: 11    End of Session Equipment Utilized During Treatment: Gait belt Activity Tolerance: Patient tolerated treatment well Patient left: with call bell/phone within reach;in chair;with chair alarm set   PT Visit Diagnosis: Difficulty in walking, not elsewhere classified (R26.2)     Time: 3736-6815 PT  Time Calculation (min) (ACUTE ONLY): 18 min  Charges:  $Gait Training: 8-22 mins                    G CodesKenyon Ana, PT Pager: [phone] 01/10/2018    Kenyon Ana 01/10/2018, 1:55 PM

## 2018-01-11 ENCOUNTER — Encounter (INDEPENDENT_AMBULATORY_CARE_PROVIDER_SITE_OTHER): Payer: Self-pay | Admitting: Orthopaedic Surgery

## 2018-01-11 ENCOUNTER — Encounter (HOSPITAL_COMMUNITY): Payer: Self-pay | Admitting: Orthopaedic Surgery

## 2018-01-11 ENCOUNTER — Telehealth (INDEPENDENT_AMBULATORY_CARE_PROVIDER_SITE_OTHER): Payer: Self-pay | Admitting: Orthopaedic Surgery

## 2018-01-11 LAB — CBC
HCT: 25.7 % — ABNORMAL LOW (ref 36.0–46.0)
Hemoglobin: 8.5 g/dL — ABNORMAL LOW (ref 12.0–15.0)
MCH: 33.3 pg (ref 26.0–34.0)
MCHC: 33.1 g/dL (ref 30.0–36.0)
MCV: 100.8 fL — ABNORMAL HIGH (ref 78.0–100.0)
PLATELETS: 196 10*3/uL (ref 150–400)
RBC: 2.55 MIL/uL — AB (ref 3.87–5.11)
RDW: 13.9 % (ref 11.5–15.5)
WBC: 11.5 10*3/uL — AB (ref 4.0–10.5)

## 2018-01-11 LAB — GLUCOSE, CAPILLARY
Glucose-Capillary: 203 mg/dL — ABNORMAL HIGH (ref 65–99)
Glucose-Capillary: 102 mg/dL — ABNORMAL HIGH (ref 65–99)

## 2018-01-11 MED ORDER — ASPIRIN 81 MG PO CHEW: 81.0000 mg | CHEWABLE_TABLET | Freq: Two times a day (BID) | ORAL | 0 refills | Status: DC

## 2018-01-11 MED ORDER — OXYCODONE HCL 5 MG PO TABS: 5.0000 mg | ORAL_TABLET | ORAL | 0 refills | Status: DC | PRN

## 2018-01-11 NOTE — Discharge Summary (Signed)
Patient ID: [name] MRN: [id number] DOB/AGE: 1945-10-20 72 y.o.  Admit date: 01/08/2018 Discharge date: 01/11/2018  Admission Diagnoses:  Principal Problem:   Unilateral primary osteoarthritis, left hip Active Problems:   Status post total replacement of left hip   Discharge Diagnoses:  Same  Past Medical History:  Diagnosis Date  . Arthritis   . Confusion   . Diabetes mellitus without complication ([place])    type 2  . Hepatitis    pt. denies  . Hypertension   . Lumbar stenosis   . Memory loss   . Nocturia   . Numbness and tingling    legs and feet  . Wears glasses     Surgeries: Procedure(s): LEFT TOTAL HIP ARTHROPLASTY ANTERIOR APPROACH on 01/08/2018   Consultants:   Discharged Condition: Improved  Hospital Course: [name] is an 72 y.o. female who was admitted 01/08/2018 for operative treatment ofUnilateral primary osteoarthritis, left hip. Patient has severe unremitting pain that affects sleep, daily activities, and work/hobbies. After pre-op clearance the patient was taken to the operating room on 01/08/2018 and underwent  Procedure(s): LEFT TOTAL HIP ARTHROPLASTY ANTERIOR APPROACH.    Patient was given perioperative antibiotics:  Anti-infectives (From admission, onward)   Start     Dose/Rate Route Frequency Ordered Stop   01/08/18 1800  ceFAZolin (ANCEF) IVPB 1 g/50 mL premix     1 g 100 mL/hr over 30 Minutes Intravenous Every 6 hours 01/08/18 1631 01/08/18 2358   01/08/18 1027  ceFAZolin (ANCEF) IVPB 2g/100 mL premix     2 g 200 mL/hr over 30 Minutes Intravenous On call to O.R. 01/08/18 1027 01/08/18 1255       Patient was given sequential compression devices, early ambulation, and chemoprophylaxis to prevent DVT.  Patient benefited maximally from hospital stay and there were no complications.    Recent vital signs:  Patient Vitals for the past 24 hrs:  BP Temp Temp src Pulse Resp SpO2  01/11/18 0516 (!) 147/70 97.8 F (36.6 C) Oral 79 16 98  %  01/10/18 1956 (!) 143/45 99.5 F (37.5 C) Oral 90 16 94 %  01/10/18 1340 132/73 (!) 97.5 F (36.4 C) Oral 89 16 96 %     Recent laboratory studies:  Recent Labs    01/09/18 0514 01/10/18 0517 01/11/18 0439  WBC 10.7* 10.1 11.5*  HGB 9.1* 8.9* 8.5*  HCT 27.7* 26.4* 25.7*  PLT 195 184 196  NA 135  --   --   K 3.9  --   --   CL 102  --   --   CO2 25  --   --   BUN 20  --   --   CREATININE 0.87  --   --   GLUCOSE 153*  --   --   CALCIUM 8.2*  --   --      Discharge Medications:   Allergies as of 01/11/2018      Reactions   Acyclovir And Related       Medication List    STOP taking these medications   acetaminophen-codeine 300-30 MG tablet Commonly known as:  TYLENOL #3   meloxicam 7.5 MG tablet Commonly known as:  MOBIC   traMADol 50 MG tablet Commonly known as:  ULTRAM     TAKE these medications   ACCU-CHEK AVIVA PLUS test strip Generic drug:  glucose blood USE TO TEST BLOOD SUGAR ONCE DAILY DX: R73.03   ACCU-CHEK AVIVA PLUS w/Device Kit Use to test  blood sugar once daily Dx: R73.03   ACCU-CHEK SOFTCLIX LANCETS lancets USE AS INSTRUCTED   AMINO ACID PO Take 1 tablet by mouth 2 (two) times daily.   aspirin 81 MG chewable tablet Chew 1 tablet (81 mg total) by mouth 2 (two) times daily.   buPROPion 75 MG tablet Commonly known as:  WELLBUTRIN Take 1 tablet (75 mg total) by mouth daily with breakfast.   chlorhexidine 0.12 % solution Commonly known as:  PERIDEX RINSE IN THE AM AND PM FOR 30 SECONDS THEN SPIT AS NEEDED FOR MOUTH PAIN   Chromium Picolinate 1000 MCG Tabs Take 1,000 mcg by mouth daily.   Co Q 10 100 MG Caps Take 100 mg by mouth daily.   [employer] SENIOR PROBIOTIC Caps Take 1 capsule by mouth 2 (two) times daily. What changed:  when to take this   docusate sodium 100 MG capsule Commonly known as:  COLACE Take 100 mg by mouth 3 (three) times daily.   DULoxetine 60 MG capsule Commonly known as:  CYMBALTA Take 1 capsule (60 mg total)  by mouth every evening. What changed:  when to take this   Fish Oil 1000 MG Caps Take 1,000 mg by mouth 2 (two) times daily.   gabapentin 300 MG capsule Commonly known as:  NEURONTIN TAKE TWO CAPSULES BY MOUTH TWICE A DAY TAKE 3 CAPSULES AT BEDTIME What changed:    how much to take  how to take this  when to take this  additional instructions   lisinopril-hydrochlorothiazide 20-25 MG tablet Commonly known as:  PRINZIDE,ZESTORETIC TAKE 2 TABLETS BY MOUTH DAILY   metFORMIN 500 MG 24 hr tablet Commonly known as:  GLUCOPHAGE-XR TAKE 1 TABLET (500 MG TOTAL) BY MOUTH DAILY WITH BREAKFAST.   multivitamin with minerals tablet Take 1 tablet by mouth daily.   oxyCODONE 5 MG immediate release tablet Commonly known as:  Oxy IR/ROXICODONE Take 1-2 tablets (5-10 mg total) by mouth every 4 (four) hours as needed for moderate pain (pain score 4-6).   Soya Lecithin 1200 MG Caps Take 1,200 mg by mouth 2 (two) times daily.   SUPER B COMPLEX/C PO Take 1 tablet by mouth daily.   vitamin C 500 MG tablet Commonly known as:  ASCORBIC ACID Take 500 mg by mouth daily.   zinc gluconate 50 MG tablet Take 50 mg by mouth daily.            Durable Medical Equipment  (From admission, onward)        Start     Ordered   01/08/18 1632  DME 3 n 1  Once     01/08/18 1631   01/08/18 1632  DME Walker rolling  Once    Question:  Patient needs a walker to treat with the following condition  Answer:  Status post total replacement of left hip   01/08/18 1631      Diagnostic Studies: Dg Pelvis Portable  Result Date: 01/08/2018 CLINICAL DATA:  Left hip replacement. EXAM: PORTABLE PELVIS 1-2 VIEWS COMPARISON:  01/08/2018. FINDINGS: Lumbar spine fusion. Total left hip replacement. Anatomic alignment. Hardware intact. Degenerative change right hip. No acute bony abnormality. Pelvic calcifications consistent phleboliths. IMPRESSION: Total left hip replacement with anatomic alignment. Electronically  Signed   By: Marcello Moores  Register   On: 01/08/2018 15:07   Dg C-arm 1-60 Min-no Report  Result Date: 01/08/2018 Fluoroscopy was utilized by the requesting physician.  No radiographic interpretation.   Dg Hip Operative Unilat W Or W/o Pelvis Left  Result Date: 01/08/2018 CLINICAL DATA:  Left hip replacement. EXAM: OPERATIVE LEFT HIP (WITH PELVIS IF PERFORMED) C-arm VIEWS TECHNIQUE: Fluoroscopic spot image(s) were submitted for interpretation post-operatively. COMPARISON:  11/28/2014. FINDINGS: Interval left total hip prosthesis in satisfactory position and alignment. No fracture or dislocation seen. IMPRESSION: Satisfactory postoperative appearance of a left total hip prosthesis. Electronically Signed   By: Claudie Revering M.D.   On: 01/08/2018 14:23   Xr Hip Unilat W Or W/o Pelvis 2-3 Views Left  Result Date: 12/17/2017 AP pelvis and lateral left hip shows severe end-stage arthritis of left hip.  There is complete loss of the left hip joint space.  There is slight collapse of the femoral head.  There are sclerotic changes in cystic changes as well.  Her right hip appears normal.   Disposition: Discharge disposition: 03-Skilled Nursing Facility       Discharge Instructions    Discharge patient   Complete by:  As directed    Discharge disposition:  03-Skilled [facility]   Discharge patient date:  01/11/2018      Follow-up Information    Mcarthur Rossetti, MD Follow up in 2 week(s).   Specialty:  Orthopedic Surgery Contact information: [place] [address] [phone]            Signed: Mcarthur Rossetti 01/11/2018, 7:12 AM

## 2018-01-11 NOTE — Progress Notes (Signed)
Patient ID: [name], female   DOB: 31-Mar-1946, 72 y.o.   MRN: [id number] Doing well overall.  Vitals stable and hip stable.  Can be discharged to skilled nursing today.

## 2018-01-11 NOTE — Progress Notes (Signed)
Occupational Therapy Treatment Patient Details Name: TERRIAH REGGIO MRN: [id number] DOB: 04/21/1946 Today's Date: 01/11/2018    History of present illness s/p L DA THA; PMH: confusion, DM, back surgery, mood disorder   OT comments  Pt needs SNF for rehab  Follow Up Recommendations  SNF(pt does not have 24/7)    Equipment Recommendations  None recommended by OT(has 3:1)    Recommendations for Other Services      Precautions / Restrictions Precautions Precautions: Fall Restrictions Weight Bearing Restrictions: No Other Position/Activity Restrictions: WBAT       Mobility Bed Mobility               General bed mobility comments: pt sitting edge of chair  Transfers Overall transfer level: Needs assistance Equipment used: Rolling walker (2 wheeled) Transfers: Sit to/from [facility] Sit to Stand: Min assist Stand pivot transfers: Min assist       General transfer comment: 75% VC's to stay on task and safety with turns         ADL either performed or assessed with clinical judgement   ADL Overall ADL's : Needs assistance/impaired                     Lower Body Dressing: Moderate assistance;Sit to/from stand;Cueing for sequencing;Cueing for safety   Toilet Transfer: Minimal assistance;Stand-pivot;BSC;RW Toilet Transfer Details (indicate cue type and reason): increased time and encouragement Toileting- Clothing Manipulation and Hygiene: Minimal assistance;Sit to/from stand;Cueing for sequencing;Cueing for safety Toileting - Clothing Manipulation Details (indicate cue type and reason): increased time             Vision Patient Visual Report: No change from baseline            Cognition Arousal/Alertness: Awake/alert Behavior During Therapy: WFL for tasks assessed/performed Overall Cognitive Status: Impaired/Different from baseline Area of Impairment: Attention;Following commands;Problem solving;Safety/judgement                    Current Attention Level: Sustained   Following Commands: Follows one step commands inconsistently Safety/Judgement: Decreased awareness of safety;Decreased awareness of deficits   Problem Solving: Difficulty sequencing;Requires verbal cues;Requires tactile cues General Comments: frequent redirection needed to stay on task                   Pertinent Vitals/ Pain       Pain Assessment: 0-10 Pain Score: 4  Pain Location: L hip Pain Descriptors / Indicators: Sore;Operative site guarding Pain Intervention(s): Limited activity within patient's tolerance;Monitored during session;Repositioned         Frequency  Min 2X/week        Progress Toward Goals  OT Goals(current goals can now be found in the care plan section)  Progress towards OT goals: Progressing toward goals     Plan Discharge plan remains appropriate       AM-PAC PT "6 Clicks" Daily Activity     Outcome Measure   Help from another person eating meals?: None Help from another person taking care of personal grooming?: A Little Help from another person toileting, which includes using toliet, bedpan, or urinal?: A Lot Help from another person bathing (including washing, rinsing, drying)?: A Lot Help from another person to put on and taking off regular upper body clothing?: A Little Help from another person to put on and taking off regular lower body clothing?: A Lot 6 Click Score: 16    End of Session Equipment Utilized During Treatment: Rolling walker  OT Visit Diagnosis: Pain;Unsteadiness on feet (R26.81) Pain - Right/Left: Left Pain - part of body: Hip   Activity Tolerance Patient tolerated treatment well   Patient Left in chair;with call bell/phone within reach;with chair alarm set   Nurse Communication Mobility status        Time: 4388-8757 OT Time Calculation (min): 9 min  Charges: OT General Charges $OT Visit: 1 Visit OT Treatments $Self Care/Home Management : 8-22  mins  [place], [place] [phone]   Payton Mccallum D 01/11/2018, 12:19 PM

## 2018-01-11 NOTE — Telephone Encounter (Signed)
See below

## 2018-01-11 NOTE — Telephone Encounter (Signed)
Meghan, social worker from [facility] called stating that the patient does not have insurance authorization for Skilled Nursing yet, but she anticipates it tomorrow.  CB#[phone].  Thank you.

## 2018-01-11 NOTE — Progress Notes (Signed)
Physical Therapy Treatment Patient Details Name: [name] MRN: [id number] DOB: 13-Jul-1946 Today's Date: 01/11/2018    History of Present Illness s/p L DA THA; PMH: confusion, DM, back surgery, mood disorder    PT Comments    POD # 3 Pt OOB in recliner.  Assisted with amb and a few TE's.  General transfer comment: 75% VC's to stay on task and safety with turns General Gait Details: decreased amb distance due to increased c/o fatigue.  Unsteady gait esp with turns and backward steps to recliner.  Easily distracted and required redirection.  HIGH FALL RISK.  Pt lived home alone and will need [facility] at [facility].   Follow Up Recommendations  SNF     Equipment Recommendations  None recommended by PT    Recommendations for Other Services       Precautions / Restrictions Precautions Precautions: Fall Restrictions Weight Bearing Restrictions: No Other Position/Activity Restrictions: WBAT    Mobility  Bed Mobility               General bed mobility comments: OOB in recliner   Transfers Overall transfer level: Needs assistance Equipment used: Rolling walker (2 wheeled) Transfers: Sit to/from [facility] Sit to Stand: Min assist         General transfer comment: 75% VC's to stay on task and safety with turns   Ambulation/Gait Ambulation/Gait assistance: Min assist;+2 safety/equipment Ambulation Distance (Feet): 38 Feet Assistive device: Rolling walker (2 wheeled) Gait Pattern/deviations: Step-to pattern;Step-through pattern;Trunk flexed Gait velocity: decreased   General Gait Details: decreased amb distance due to increased c/o fatigue.  Unsteady gait esp with turns and backward steps to recliner.  Easily distracted and required redirection.  HIGH FALL RISK.    Stairs             Wheelchair Mobility    Modified Rankin (Stroke Patients Only)       Balance                                            Cognition  Arousal/Alertness: Awake/alert Behavior During Therapy: (easily distracted) Overall Cognitive Status: Impaired/Different from baseline Area of Impairment: Attention;Following commands;Problem solving;Safety/judgement                   Current Attention Level: Sustained   Following Commands: Follows one step commands inconsistently Safety/Judgement: Decreased awareness of safety;Decreased awareness of deficits   Problem Solving: Difficulty sequencing;Requires verbal cues;Requires tactile cues General Comments: frequent redirection needed to stay on task      Exercises  10 reps B LE AP, knee presses and LAQ's    General Comments        Pertinent Vitals/Pain Pain Assessment: 0-10 Pain Score: 3  Pain Location: L hip Pain Descriptors / Indicators: Sore;Operative site guarding Pain Intervention(s): Monitored during session;Ice applied;Repositioned    Home Living                      Prior Function            PT Goals (current goals can now be found in the care plan section) Progress towards PT goals: Progressing toward goals    Frequency    7X/week      PT Plan Current plan remains appropriate    Co-evaluation  AM-PAC PT "6 Clicks" Daily Activity  Outcome Measure  Difficulty turning over in bed (including adjusting bedclothes, sheets and blankets)?: A Lot Difficulty moving from lying on back to sitting on the side of the bed? : A Lot Difficulty sitting down on and standing up from a chair with arms (e.g., wheelchair, bedside commode, etc,.)?: A Lot Help needed moving to and from a bed to chair (including a wheelchair)?: A Lot Help needed walking in hospital room?: A Lot Help needed climbing 3-5 steps with a railing? : Total 6 Click Score: 11    End of Session Equipment Utilized During Treatment: Gait belt Activity Tolerance: Patient tolerated treatment well Patient left: in chair;with call bell/phone within reach   PT Visit  Diagnosis: Difficulty in walking, not elsewhere classified (R26.2)     Time: 8101-7510 PT Time Calculation (min) (ACUTE ONLY): 15 min  Charges:  $Gait Training: 8-22 mins                    G Codes:       Rica Koyanagi  PTA [facility]  Acute  Rehab Pager      [phone]

## 2018-01-11 NOTE — Progress Notes (Addendum)
Pt has selected [facility] for short term rehab. Facility initiated [employer] authorization- will send updated session notes from today when pt has participated.  Sharren Bridge, MSW, LCSW Clinical Social Work 01/11/2018 [phone]  Per facility [facility] Medicare anticipates authorization for SNF admission tomorrow. Pt states she does not have funds to private pay in interim. Facility states will not accept LOG. CSW reached out to other facilities with bed offers- will not accept LOG. Updated RN and attending that pt does not have authorization from insurance for SNF admission at this time.

## 2018-01-12 ENCOUNTER — Encounter (INDEPENDENT_AMBULATORY_CARE_PROVIDER_SITE_OTHER): Payer: Self-pay | Admitting: Orthopaedic Surgery

## 2018-01-12 ENCOUNTER — Telehealth: Payer: Self-pay | Admitting: Internal Medicine

## 2018-01-12 DIAGNOSIS — M48061 Spinal stenosis, lumbar region without neurogenic claudication: Secondary | ICD-10-CM | POA: Diagnosis not present

## 2018-01-12 DIAGNOSIS — R609 Edema, unspecified: Secondary | ICD-10-CM | POA: Diagnosis not present

## 2018-01-12 DIAGNOSIS — L97812 Non-pressure chronic ulcer of other part of right lower leg with fat layer exposed: Secondary | ICD-10-CM | POA: Diagnosis not present

## 2018-01-12 DIAGNOSIS — M1990 Unspecified osteoarthritis, unspecified site: Secondary | ICD-10-CM | POA: Diagnosis not present

## 2018-01-12 DIAGNOSIS — F05 Delirium due to known physiological condition: Secondary | ICD-10-CM | POA: Diagnosis not present

## 2018-01-12 DIAGNOSIS — M6281 Muscle weakness (generalized): Secondary | ICD-10-CM | POA: Diagnosis not present

## 2018-01-12 DIAGNOSIS — M545 Low back pain: Secondary | ICD-10-CM | POA: Diagnosis not present

## 2018-01-12 DIAGNOSIS — I87331 Chronic venous hypertension (idiopathic) with ulcer and inflammation of right lower extremity: Secondary | ICD-10-CM | POA: Diagnosis not present

## 2018-01-12 DIAGNOSIS — Z96642 Presence of left artificial hip joint: Secondary | ICD-10-CM | POA: Diagnosis not present

## 2018-01-12 DIAGNOSIS — F419 Anxiety disorder, unspecified: Secondary | ICD-10-CM | POA: Diagnosis not present

## 2018-01-12 DIAGNOSIS — E119 Type 2 diabetes mellitus without complications: Secondary | ICD-10-CM | POA: Diagnosis not present

## 2018-01-12 DIAGNOSIS — F39 Unspecified mood [affective] disorder: Secondary | ICD-10-CM | POA: Diagnosis not present

## 2018-01-12 DIAGNOSIS — M1612 Unilateral primary osteoarthritis, left hip: Secondary | ICD-10-CM | POA: Diagnosis not present

## 2018-01-12 DIAGNOSIS — E1140 Type 2 diabetes mellitus with diabetic neuropathy, unspecified: Secondary | ICD-10-CM | POA: Diagnosis not present

## 2018-01-12 DIAGNOSIS — R279 Unspecified lack of coordination: Secondary | ICD-10-CM | POA: Diagnosis not present

## 2018-01-12 DIAGNOSIS — I10 Essential (primary) hypertension: Secondary | ICD-10-CM | POA: Diagnosis not present

## 2018-01-12 DIAGNOSIS — F339 Major depressive disorder, recurrent, unspecified: Secondary | ICD-10-CM | POA: Diagnosis not present

## 2018-01-12 DIAGNOSIS — M25552 Pain in left hip: Secondary | ICD-10-CM | POA: Diagnosis not present

## 2018-01-12 DIAGNOSIS — Z743 Need for continuous supervision: Secondary | ICD-10-CM | POA: Diagnosis not present

## 2018-01-12 DIAGNOSIS — L97223 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e: Secondary | ICD-10-CM | POA: Diagnosis not present

## 2018-01-12 DIAGNOSIS — R600 Localized edema: Secondary | ICD-10-CM | POA: Diagnosis not present

## 2018-01-12 DIAGNOSIS — K5903 Drug induced constipation: Secondary | ICD-10-CM | POA: Diagnosis not present

## 2018-01-12 DIAGNOSIS — R410 Disorientation, unspecified: Secondary | ICD-10-CM | POA: Diagnosis not present

## 2018-01-12 DIAGNOSIS — M129 Arthropathy, unspecified: Secondary | ICD-10-CM | POA: Diagnosis not present

## 2018-01-12 DIAGNOSIS — E11622 Type 2 diabetes mellitus with other skin ulcer: Secondary | ICD-10-CM | POA: Diagnosis not present

## 2018-01-12 MED ORDER — LISINOPRIL 20 MG PO TABS: 20.0000 mg | ORAL_TABLET | Freq: Every day | ORAL | Status: DC

## 2018-01-12 NOTE — Telephone Encounter (Signed)
Copied from [place] [phone]. Topic: Quick Communication - See Telephone Encounter >> Jan 12, 2018  1:19 PM Bea Graff, NT wrote: CRM for notification. See Telephone encounter for: 01/12/18. Heather with [facility] calling and states this pt has been admitted to a skilled nursing facility. CB#: [phone].

## 2018-01-12 NOTE — Progress Notes (Signed)
Patient ID: [name], female   DOB: 1946/03/19, 72 y.o.   MRN: [id number] No acute changes.  Can go to skilled nursing today.

## 2018-01-12 NOTE — Care Management Important Message (Signed)
Important Message  Patient Details  Name: ERYANNA REGAL MRN: [id number] Date of Birth: 01-30-1946   Medicare Important Message Given:  Yes    Kerin Salen 01/12/2018, 12:32 [place] Message  Patient Details  Name: LURINE IMEL MRN: [id number] Date of Birth: 03-04-1946   Medicare Important Message Given:  Yes    Kerin Salen 01/12/2018, 12:32 PM

## 2018-01-12 NOTE — Progress Notes (Signed)
Report called to [facility]. Report given to [place], Therapist, sports.  Awaiting transport at this time.

## 2018-01-12 NOTE — Discharge Summary (Signed)
Patient ID: [name] MRN: [id number] DOB/AGE: 70-Dec-1947 70 y.o.  Admit date: 01/08/2018 Discharge date: 01/12/2018  Admission Diagnoses:  Principal Problem:   Unilateral primary osteoarthritis, left hip Active Problems:   Status post total replacement of left hip   Discharge Diagnoses:  Same  Past Medical History:  Diagnosis Date  . Arthritis   . Confusion   . Diabetes mellitus without complication ([place])    type 2  . Hepatitis    pt. denies  . Hypertension   . Lumbar stenosis   . Memory loss   . Nocturia   . Numbness and tingling    legs and feet  . Wears glasses     Surgeries: Procedure(s): LEFT TOTAL HIP ARTHROPLASTY ANTERIOR APPROACH on 01/08/2018   Consultants:   Discharged Condition: Improved  Hospital Course: [name] is an 72 y.o. female who was admitted 01/08/2018 for operative treatment ofUnilateral primary osteoarthritis, left hip. Patient has severe unremitting pain that affects sleep, daily activities, and work/hobbies. After pre-op clearance the patient was taken to the operating room on 01/08/2018 and underwent  Procedure(s): LEFT TOTAL HIP ARTHROPLASTY ANTERIOR APPROACH.    Patient was given perioperative antibiotics:  Anti-infectives (From admission, onward)   Start     Dose/Rate Route Frequency Ordered Stop   01/08/18 1800  ceFAZolin (ANCEF) IVPB 1 g/50 mL premix     1 g 100 mL/hr over 30 Minutes Intravenous Every 6 hours 01/08/18 1631 01/08/18 2358   01/08/18 1027  ceFAZolin (ANCEF) IVPB 2g/100 mL premix     2 g 200 mL/hr over 30 Minutes Intravenous On call to O.R. 01/08/18 1027 01/08/18 1255       Patient was given sequential compression devices, early ambulation, and chemoprophylaxis to prevent DVT.  Patient benefited maximally from hospital stay and there were no complications.    Recent vital signs:  Patient Vitals for the past 24 hrs:  BP Temp Temp src Pulse Resp SpO2  01/12/18 0551 (!) 155/91 (!) 97.5 F (36.4 C) Oral 81 16  94 %  01/11/18 1954 119/63 99.1 F (37.3 C) Oral 85 18 94 %  01/11/18 1320 121/77 98.4 F (36.9 C) Oral 87 16 95 %  01/11/18 1048 140/61 - - - - -     Recent laboratory studies:  Recent Labs    01/10/18 0517 01/11/18 0439  WBC 10.1 11.5*  HGB 8.9* 8.5*  HCT 26.4* 25.7*  PLT 184 196     Discharge Medications:   Allergies as of 01/12/2018      Reactions   Acyclovir And Related       Medication List    STOP taking these medications   acetaminophen-codeine 300-30 MG tablet Commonly known as:  TYLENOL #3   meloxicam 7.5 MG tablet Commonly known as:  MOBIC   traMADol 50 MG tablet Commonly known as:  ULTRAM     TAKE these medications   ACCU-CHEK AVIVA PLUS test strip Generic drug:  glucose blood USE TO TEST BLOOD SUGAR ONCE DAILY DX: R73.03   ACCU-CHEK AVIVA PLUS w/Device Kit Use to test blood sugar once daily Dx: R73.03   ACCU-CHEK SOFTCLIX LANCETS lancets USE AS INSTRUCTED   AMINO ACID PO Take 1 tablet by mouth 2 (two) times daily.   aspirin 81 MG chewable tablet Chew 1 tablet (81 mg total) by mouth 2 (two) times daily.   buPROPion 75 MG tablet Commonly known as:  WELLBUTRIN Take 1 tablet (75 mg total) by mouth daily with breakfast.  chlorhexidine 0.12 % solution Commonly known as:  PERIDEX RINSE IN THE AM AND PM FOR 30 SECONDS THEN SPIT AS NEEDED FOR MOUTH PAIN   Chromium Picolinate 1000 MCG Tabs Take 1,000 mcg by mouth daily.   Co Q 10 100 MG Caps Take 100 mg by mouth daily.   [employer] SENIOR PROBIOTIC Caps Take 1 capsule by mouth 2 (two) times daily. What changed:  when to take this   docusate sodium 100 MG capsule Commonly known as:  COLACE Take 100 mg by mouth 3 (three) times daily.   DULoxetine 60 MG capsule Commonly known as:  CYMBALTA Take 1 capsule (60 mg total) by mouth every evening. What changed:  when to take this   Fish Oil 1000 MG Caps Take 1,000 mg by mouth 2 (two) times daily.   gabapentin 300 MG capsule Commonly known as:   NEURONTIN TAKE TWO CAPSULES BY MOUTH TWICE A DAY TAKE 3 CAPSULES AT BEDTIME What changed:    how much to take  how to take this  when to take this  additional instructions   lisinopril-hydrochlorothiazide 20-25 MG tablet Commonly known as:  PRINZIDE,ZESTORETIC TAKE 2 TABLETS BY MOUTH DAILY   metFORMIN 500 MG 24 hr tablet Commonly known as:  GLUCOPHAGE-XR TAKE 1 TABLET (500 MG TOTAL) BY MOUTH DAILY WITH BREAKFAST.   multivitamin with minerals tablet Take 1 tablet by mouth daily.   oxyCODONE 5 MG immediate release tablet Commonly known as:  Oxy IR/ROXICODONE Take 1-2 tablets (5-10 mg total) by mouth every 4 (four) hours as needed for moderate pain (pain score 4-6).   Soya Lecithin 1200 MG Caps Take 1,200 mg by mouth 2 (two) times daily.   SUPER B COMPLEX/C PO Take 1 tablet by mouth daily.   vitamin C 500 MG tablet Commonly known as:  ASCORBIC ACID Take 500 mg by mouth daily.   zinc gluconate 50 MG tablet Take 50 mg by mouth daily.            Durable Medical Equipment  (From admission, onward)        Start     Ordered   01/08/18 1632  DME 3 n 1  Once     01/08/18 1631   01/08/18 1632  DME Walker rolling  Once    Question:  Patient needs a walker to treat with the following condition  Answer:  Status post total replacement of left hip   01/08/18 1631      Diagnostic Studies: Dg Pelvis Portable  Result Date: 01/08/2018 CLINICAL DATA:  Left hip replacement. EXAM: PORTABLE PELVIS 1-2 VIEWS COMPARISON:  01/08/2018. FINDINGS: Lumbar spine fusion. Total left hip replacement. Anatomic alignment. Hardware intact. Degenerative change right hip. No acute bony abnormality. Pelvic calcifications consistent phleboliths. IMPRESSION: Total left hip replacement with anatomic alignment. Electronically Signed   By: Marcello Moores  Register   On: 01/08/2018 15:07   Dg C-arm 1-60 Min-no Report  Result Date: 01/08/2018 Fluoroscopy was utilized by the requesting physician.  No  radiographic interpretation.   Dg Hip Operative Unilat W Or W/o Pelvis Left  Result Date: 01/08/2018 CLINICAL DATA:  Left hip replacement. EXAM: OPERATIVE LEFT HIP (WITH PELVIS IF PERFORMED) C-arm VIEWS TECHNIQUE: Fluoroscopic spot image(s) were submitted for interpretation post-operatively. COMPARISON:  11/28/2014. FINDINGS: Interval left total hip prosthesis in satisfactory position and alignment. No fracture or dislocation seen. IMPRESSION: Satisfactory postoperative appearance of a left total hip prosthesis. Electronically Signed   By: Claudie Revering M.D.   On: 01/08/2018 14:23  Xr Hip Unilat W Or W/o Pelvis 2-3 Views Left  Result Date: 12/17/2017 AP pelvis and lateral left hip shows severe end-stage arthritis of left hip.  There is complete loss of the left hip joint space.  There is slight collapse of the femoral head.  There are sclerotic changes in cystic changes as well.  Her right hip appears normal.   Disposition: Discharge disposition: 03-Skilled Nursing Facility       Discharge Instructions    Discharge patient   Complete by:  As directed    Discharge disposition:  03-Skilled [facility]   Discharge patient date:  01/11/2018   Discharge patient   Complete by:  As directed    Discharge disposition:  03-Skilled [facility]   Discharge patient date:  01/12/2018       Contact information for follow-up providers    Mcarthur Rossetti, MD Follow up in 2 week(s).   Specialty:  Orthopedic Surgery Contact information: [place] [address] [phone]            Contact information for after-discharge care    [facility] .   Service:  Skilled Nursing Contact information: [address] [place] [phone]                   Signed: Mcarthur Rossetti 01/12/2018, 7:09 AM

## 2018-01-12 NOTE — Clinical Social Work Placement (Signed)
[facility] Medicare Authorization received. /Facility will accept patient after 5:00pm Nurse given the number to call report.  4:59 [facility] arranged for transport  CLINICAL SOCIAL WORK PLACEMENT  NOTE  Date:  01/12/2018  Patient Details  Name: [name] MRN: [id number] Date of Birth: [birth year]  Clinical Social Work is seeking post-discharge placement for this patient at the [place] level of care (*CSW will initial, date and re-position this form in  chart as items are completed):  Yes   Patient/family provided with [facility] Work Department's list of facilities offering this level of care within the geographic area requested by the patient (or if unable, by the patient's family).  Yes   Patient/family informed of their freedom to choose among providers that offer the needed level of care, that participate in Medicare, Medicaid or managed care program needed by the patient, have an available bed and are willing to accept the patient.  Yes   Patient/family informed of [facility]'s ownership interest in [facility] and [facility], as well as of the fact that they are under no obligation to receive care at these facilities.  PASRR submitted to EDS on       PASRR number received on       Existing PASRR number confirmed on 01/09/18     FL2 transmitted to all facilities in geographic area requested by pt/family on       FL2 transmitted to all facilities within larger geographic area on 01/12/18     Patient informed that his/her managed care company has contracts with or will negotiate with certain facilities, including the following:  [facility]     Yes   Patient/family informed of bed offers received.  Patient chooses bed at [facility]     Physician recommends and patient chooses bed at      Patient to be transferred to [facility] on  .  Patient to be transferred to facility by [facility]     Patient family  notified on   of transfer.  Name of family member notified:  Patient to notify her son and friend.      PHYSICIAN       Additional Comment:    _______________________________________________ Lia Hopping, LCSW 01/12/2018, 4:24 PM

## 2018-01-12 NOTE — Telephone Encounter (Signed)
FYI to Dr Silvio Pate, Dr Silvio Pate will be back in office on 01/14/18 and Avie Echevaria NP.

## 2018-01-12 NOTE — Telephone Encounter (Signed)
noted 

## 2018-01-13 DIAGNOSIS — M48061 Spinal stenosis, lumbar region without neurogenic claudication: Secondary | ICD-10-CM | POA: Diagnosis not present

## 2018-01-13 DIAGNOSIS — M6281 Muscle weakness (generalized): Secondary | ICD-10-CM | POA: Diagnosis not present

## 2018-01-13 DIAGNOSIS — M25552 Pain in left hip: Secondary | ICD-10-CM | POA: Diagnosis not present

## 2018-01-13 DIAGNOSIS — M545 Low back pain: Secondary | ICD-10-CM | POA: Diagnosis not present

## 2018-01-13 DIAGNOSIS — M129 Arthropathy, unspecified: Secondary | ICD-10-CM | POA: Diagnosis not present

## 2018-01-13 DIAGNOSIS — Z96642 Presence of left artificial hip joint: Secondary | ICD-10-CM | POA: Diagnosis not present

## 2018-01-13 NOTE — Telephone Encounter (Signed)
I am aware of this She just had THR

## 2018-01-14 ENCOUNTER — Telehealth: Payer: Self-pay

## 2018-01-14 NOTE — Telephone Encounter (Signed)
I cannot do anything till she gets out of rehab----and I was hoping that after the hip replacement, she would be considerably better in terms of her pain We can discuss this at her follow up appt after she leaves rehab

## 2018-01-14 NOTE — Telephone Encounter (Signed)
Spoke to pt who states she was inquiring about a pain mgmt referral for her son. I advised pt that unfortunately since Dr Silvio Pate is not her son's PCP he is unable to refer him to any specialty office and he will need to speak with his personal physician, directly.

## 2018-01-14 NOTE — Telephone Encounter (Signed)
Noted  

## 2018-01-14 NOTE — Telephone Encounter (Signed)
Spoke to pt. She said she is doing better.  She is asking about a referral to a pain clinic due to her arthritis pain all over.

## 2018-01-15 DIAGNOSIS — M545 Low back pain: Secondary | ICD-10-CM | POA: Diagnosis not present

## 2018-01-15 DIAGNOSIS — K5903 Drug induced constipation: Secondary | ICD-10-CM | POA: Diagnosis not present

## 2018-01-15 DIAGNOSIS — I10 Essential (primary) hypertension: Secondary | ICD-10-CM | POA: Diagnosis not present

## 2018-01-15 DIAGNOSIS — M25552 Pain in left hip: Secondary | ICD-10-CM | POA: Diagnosis not present

## 2018-01-18 ENCOUNTER — Telehealth (INDEPENDENT_AMBULATORY_CARE_PROVIDER_SITE_OTHER): Payer: Self-pay | Admitting: Orthopaedic Surgery

## 2018-01-18 DIAGNOSIS — M25552 Pain in left hip: Secondary | ICD-10-CM | POA: Diagnosis not present

## 2018-01-18 DIAGNOSIS — R410 Disorientation, unspecified: Secondary | ICD-10-CM | POA: Diagnosis not present

## 2018-01-18 DIAGNOSIS — F05 Delirium due to known physiological condition: Secondary | ICD-10-CM | POA: Diagnosis not present

## 2018-01-18 NOTE — Telephone Encounter (Signed)
Patient called stating that she is in [facility] and does not have any pain medication.  She stated that she is in a lot of pain and wanted to have her Oxycodone sent there.  Patient's CB#[phone] and the facility's number is [phone].  Thank you.

## 2018-01-18 NOTE — Telephone Encounter (Signed)
Call the place because she should have pain meds there already

## 2018-01-18 NOTE — Telephone Encounter (Signed)
Please advise 

## 2018-01-18 NOTE — Telephone Encounter (Signed)
Spoke to nurse today and she is getting pain medication for patient

## 2018-01-19 DIAGNOSIS — M545 Low back pain: Secondary | ICD-10-CM | POA: Diagnosis not present

## 2018-01-19 DIAGNOSIS — F339 Major depressive disorder, recurrent, unspecified: Secondary | ICD-10-CM | POA: Diagnosis not present

## 2018-01-19 DIAGNOSIS — F419 Anxiety disorder, unspecified: Secondary | ICD-10-CM | POA: Diagnosis not present

## 2018-01-19 DIAGNOSIS — M25552 Pain in left hip: Secondary | ICD-10-CM | POA: Diagnosis not present

## 2018-01-20 DIAGNOSIS — M6281 Muscle weakness (generalized): Secondary | ICD-10-CM | POA: Diagnosis not present

## 2018-01-20 DIAGNOSIS — E119 Type 2 diabetes mellitus without complications: Secondary | ICD-10-CM | POA: Diagnosis not present

## 2018-01-20 DIAGNOSIS — R609 Edema, unspecified: Secondary | ICD-10-CM | POA: Diagnosis not present

## 2018-01-20 DIAGNOSIS — F05 Delirium due to known physiological condition: Secondary | ICD-10-CM | POA: Diagnosis not present

## 2018-01-20 DIAGNOSIS — Z96642 Presence of left artificial hip joint: Secondary | ICD-10-CM | POA: Diagnosis not present

## 2018-01-21 ENCOUNTER — Encounter: Payer: Medicare HMO | Attending: Nurse Practitioner | Admitting: Nurse Practitioner

## 2018-01-21 ENCOUNTER — Encounter (INDEPENDENT_AMBULATORY_CARE_PROVIDER_SITE_OTHER): Payer: Self-pay | Admitting: Orthopaedic Surgery

## 2018-01-21 ENCOUNTER — Ambulatory Visit (INDEPENDENT_AMBULATORY_CARE_PROVIDER_SITE_OTHER): Payer: Medicare HMO | Admitting: Orthopaedic Surgery

## 2018-01-21 DIAGNOSIS — L97223 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e: Secondary | ICD-10-CM | POA: Insufficient documentation

## 2018-01-21 DIAGNOSIS — L97812 Non-pressure chronic ulcer of other part of right lower leg with fat layer exposed: Secondary | ICD-10-CM | POA: Insufficient documentation

## 2018-01-21 DIAGNOSIS — I87331 Chronic venous hypertension (idiopathic) with ulcer and inflammation of right lower extremity: Secondary | ICD-10-CM | POA: Insufficient documentation

## 2018-01-21 DIAGNOSIS — M1990 Unspecified osteoarthritis, unspecified site: Secondary | ICD-10-CM | POA: Insufficient documentation

## 2018-01-21 DIAGNOSIS — R600 Localized edema: Secondary | ICD-10-CM | POA: Diagnosis not present

## 2018-01-21 DIAGNOSIS — E11622 Type 2 diabetes mellitus with other skin ulcer: Secondary | ICD-10-CM | POA: Diagnosis not present

## 2018-01-21 DIAGNOSIS — E1140 Type 2 diabetes mellitus with diabetic neuropathy, unspecified: Secondary | ICD-10-CM | POA: Insufficient documentation

## 2018-01-21 DIAGNOSIS — I10 Essential (primary) hypertension: Secondary | ICD-10-CM | POA: Insufficient documentation

## 2018-01-21 DIAGNOSIS — Z96642 Presence of left artificial hip joint: Secondary | ICD-10-CM

## 2018-01-21 NOTE — Progress Notes (Signed)
The patient is 2 weeks status post a left total hip arthroplasty through direct anterior approach.  She is staying at [facility].  She is ambulate with a walker.  On exam her incision looks good so the staples been removed and Steri-Strips applied.  She has a mild  seroma but is not look worrisome.  We will get her back up and walking she does feel like there is a leg length discrepancy but I do not lay her back down.  I took measurements off of her intraoperative films and I do not remember her being a leg length discrepancy.  Certainly hopefully things will settle down more than the watch this closely.  She will hopefully transition soon to home health therapy and be able to be released safely from the skilled nursing facility at home.  She can stop her twice a day aspirin and have given her prescription to consider TED hose for peripheral edema.  We will see her back in 4 weeks see how she is doing overall.

## 2018-01-24 ENCOUNTER — Other Ambulatory Visit (INDEPENDENT_AMBULATORY_CARE_PROVIDER_SITE_OTHER): Payer: Self-pay | Admitting: Orthopaedic Surgery

## 2018-01-24 NOTE — Progress Notes (Signed)
SELENY, [name] [place] ([id number]) Visit Report for 01/21/2018 Chief Complaint Document Details Patient Name: [name]. Date of Service: 01/21/2018 3:00 PM Medical Record Number: [id number] Patient Account Number: [id number] Date of Birth/Sex: 1946/05/28 (71 y.o. F) Treating RN: Ahmed Prima Primary Care Provider: Viviana Simpler Other Clinician: Referring Provider: Viviana Simpler Treating Provider/Extender: Cathie Olden in Treatment: 16 Information Obtained from: Patient Chief Complaint She is here for evaluation of LLE Electronic Signature(s) Signed: 01/21/2018 4:16:03 PM By: Lawanda Cousins Entered By: Lawanda Cousins on 01/21/2018 [place], [place]. ([id number]) -------------------------------------------------------------------------------- HPI Details Patient Name: [name]. Date of Service: 01/21/2018 3:00 PM Medical Record Number: [id number] Patient Account Number: [id number] Date of Birth/Sex: 09-06-1946 (71 y.o. F) Treating RN: Ahmed Prima Primary Care Provider: Viviana Simpler Other Clinician: Referring Provider: Viviana Simpler Treating Provider/Extender: Cathie Olden in Treatment: 16 History of Present Illness HPI Description: 01/21/18-She is here for evaluation of her bilateral lower extremities. She was recently discharged, after treatment for bilateral lower cavities. Upon discharge she was advised to wear compression therapy. She has been noncompliant where compression therapy and presents today with bilateral lower extremity edema. She is concerned regarding a superficial, aging bruise to her left lower extremity. She has no evidence of weeping, ulceration, wound. She has been advised to wear compression therapy. Since her discharge here she has had a left hip replacement and has a follow-up with orthopedics today. Consult only Electronic Signature(s) Signed: 01/21/2018 4:17:19 PM By: Lawanda Cousins Entered By: Lawanda Cousins on  01/21/2018 16:17:19 [name] ([id number]) -------------------------------------------------------------------------------- Physician Orders Details Patient Name: [name]. Date of Service: 01/21/2018 3:00 PM Medical Record Number: [id number] Patient Account Number: [id number] Date of Birth/Sex: 1946/07/27 (71 y.o. F) Treating RN: Ahmed Prima Primary Care Provider: Viviana Simpler Other Clinician: Referring Provider: Viviana Simpler Treating Provider/Extender: Cathie Olden in Treatment: 54 Verbal / Phone Orders: No Diagnosis Coding Discharge From [facility] Services o Discharge from [facility] your compression stockings as ordered. Please call our office if you have any questions or concerns. Electronic Signature(s) Signed: 01/21/2018 4:10:02 PM By: Alric Quan Signed: 01/21/2018 5:24:28 PM By: Lawanda Cousins Entered By: Alric Quan on 01/21/2018 16:10:01 [name] ([id number]) -------------------------------------------------------------------------------- Problem List Details Patient Name: [name]. Date of Service: 01/21/2018 3:00 PM Medical Record Number: [id number] Patient Account Number: [id number] Date of Birth/Sex: [birth year] (71 y.o. F) Treating RN: Ahmed Prima Primary Care Provider: Viviana Simpler Other Clinician: Referring Provider: Viviana Simpler Treating Provider/Extender: Cathie Olden in Treatment: 16 Active Problems ICD-10 Impacting Encounter Code Description Active Date Wound Healing Diagnosis R60.9 Edema, unspecified 01/21/2018 Yes Inactive Problems Resolved Problems ICD-10 Code Description Active Date Resolved Date L97.218 Non-pressure chronic ulcer of right calf with other specified severity 09/30/2017 09/30/2017 E11.40 Type 2 diabetes mellitus with diabetic neuropathy, unspecified 09/30/2017 09/30/2017 I87.331 Chronic venous hypertension (idiopathic) with ulcer and 10/07/2017  10/07/2017 inflammation of right lower extremity 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 Date of Service: 01/21/2018 3:00 PM Medical Record Number: [id number] Patient Account Number: [id number] Date of Birth/Sex: 1946-06-19 (71 y.o. F) Treating RN: Ahmed Prima Primary Care Provider: Viviana Simpler Other Clinician: Referring Provider: Viviana Simpler Treating Provider/Extender: Cathie Olden in Treatment: 16 Subjective Chief Complaint Information obtained from Patient She is here for evaluation  of LLE History of Present Illness (HPI) 01/21/18-She is here for evaluation of her bilateral lower extremities. She was recently discharged, after treatment for bilateral lower cavities. Upon discharge she was advised to wear compression therapy. She has been noncompliant where compression therapy and presents today with bilateral lower extremity edema. She is concerned regarding a superficial, aging bruise to her left lower extremity. She has no evidence of weeping, ulceration, wound. She has been advised to wear compression therapy. Since her discharge here she has had a left hip replacement and has a follow-up with orthopedics today. Consult only Patient History Information obtained from Patient. Family History Cancer - Mother, Heart Disease - Father, No family history of Diabetes, Hereditary Spherocytosis, Hypertension, Kidney Disease, Lung Disease, Seizures, Stroke, Thyroid Problems, Tuberculosis. Social History Never smoker, Marital Status - Divorced, Alcohol Use - Rarely, Drug Use - No History, Caffeine Use -  Daily. Objective Constitutional Vitals Time Taken: 3:37 PM, Height: 61 in, Weight: 179.1 lbs, BMI: 33.8, Temperature: 97.6 F, Pulse: 91 bpm, Respiratory Rate: 18 breaths/min, Blood Pressure: 121/66 mmHg. [name], [name] ([id number]) Assessment Active Problems ICD-10 R60.9 - Edema, unspecified Plan Discharge From [facility] Services: Discharge from [place] your compression stockings as ordered. Please call our office if you have any questions or concerns. Electronic Signature(s) Signed: 01/21/2018 4:18:34 PM By: Lawanda Cousins Entered By: Lawanda Cousins on 01/21/2018 16:18:34 [name] ([id number]) -------------------------------------------------------------------------------- ROS/PFSH Details Patient Name: [name]. Date of Service: 01/21/2018 3:00 PM Medical Record Number: [id number] Patient Account Number: [id number] Date of Birth/Sex: 1946-05-10 (71 y.o. F) Treating RN: Ahmed Prima Primary Care Provider: Viviana Simpler Other Clinician: Referring Provider: Viviana Simpler Treating Provider/Extender: Cathie Olden in Treatment: 16 Information Obtained From Patient Wound History Do you currently have one or more open woundso Yes How many open wounds do you currently haveo 2 Approximately how long have you had your woundso 08/30/17 How have you been treating your wound(s) until nowo bandaging Has your wound(s) ever healed and then re-openedo No Have you had any lab work done in the past montho No Have you tested positive for an antibiotic resistant organism (MRSA, VRE)o No Have you tested positive for osteomyelitis (bone infection)o No Have you had any tests for circulation on your legso No Have you had other problems associated with your woundso Swelling Cardiovascular Medical History: Positive for: Hypertension Endocrine Medical History: Positive for: Type II Diabetes Time with diabetes: unsure Treated with: Insulin, Oral  agents Blood sugar tested every day: No Musculoskeletal Medical History: Positive for: Osteoarthritis Neurologic Medical History: Positive for: Neuropathy Immunizations Pneumococcal Vaccine: Received Pneumococcal Vaccination: No Implantable Devices Family and Social History Cancer: Yes - Mother; Diabetes: No; Heart Disease: Yes - Father; Hereditary Spherocytosis: No; Hypertension: No; Kidney Disease: No; Lung Disease: No; Seizures: No; Stroke: No; Thyroid Problems: No; Tuberculosis: No; Never smoker; Marital Status - Divorced; Alcohol Use: Rarely; Drug Use: No History; Caffeine Use: Daily; Financial Concerns: No; Food, Clothing or [name], [name]. ([id number]) Shelter Needs: No; Support System Lacking: No; Transportation Concerns: No; Advanced Directives: No; Patient does not want information on Advanced Directives; Do not resuscitate: No; Living Will: Yes (Not Provided); Medical Power of Attorney: Yes - son (Not Provided) Physician Affirmation I have reviewed and agree with the above information. Electronic Signature(s) Signed: 01/21/2018 5:24:28 PM By: Lawanda Cousins Signed: 01/22/2018 4:38:26 PM By: Alric Quan Entered By: Lawanda Cousins on 01/21/2018 16:17:31 [name], [name] ([id number]) -------------------------------------------------------------------------------- [place] Details Patient Name: [name]. Date  of Service: 01/21/2018 Medical Record Number: [id number] Patient Account Number: [id number] Date of Birth/Sex: 12-16-1945 (72 y.o. F) Treating RN: Ahmed Prima Primary Care Provider: Viviana Simpler Other Clinician: Referring Provider: Viviana Simpler Treating Provider/Extender: Cathie Olden in Treatment: 16 Diagnosis Coding ICD-10 Codes Code Description R60.9 Edema, unspecified Facility Procedures CPT4 Code: [id number] Description: 34193 - WOUND CARE VISIT-LEV 3 EST PT Modifier: Quantity: 1 Physician Procedures CPT4 Code:  7902409 Description: 73532 - WC PHYS LEVEL 2 - EST PT ICD-10 Diagnosis Description R60.9 Edema, unspecified Modifier: Quantity: 1 Electronic Signature(s) Signed: 01/21/2018 4:18:45 PM By: Lawanda Cousins Entered By: Lawanda Cousins on 01/21/2018 16:18:45

## 2018-01-25 ENCOUNTER — Other Ambulatory Visit: Payer: Self-pay | Admitting: Internal Medicine

## 2018-01-25 ENCOUNTER — Telehealth (INDEPENDENT_AMBULATORY_CARE_PROVIDER_SITE_OTHER): Payer: Self-pay | Admitting: Orthopaedic Surgery

## 2018-01-25 NOTE — Telephone Encounter (Signed)
I think we just refilled her Tylenol#3 this morning, I can cancel if needed

## 2018-01-25 NOTE — Telephone Encounter (Signed)
Sent the tramadol. Will discuss all at her appt tomorrow

## 2018-01-25 NOTE — Telephone Encounter (Signed)
Patient called needing Rx refilled (Oxycodone) The number to contact patient  Is [phone]

## 2018-01-25 NOTE — Telephone Encounter (Signed)
Spoke to pt because tramadol was not on her medication list after being d/c after surgery. She said she does not want to take oxycodone, but needs something for pain. She says she sent a MyChart message asking for a muscle relaxer, also.   She has an appt tomorrow.

## 2018-01-25 NOTE — Telephone Encounter (Signed)
Patient aware Tylenol#3 is called in

## 2018-01-25 NOTE — Telephone Encounter (Signed)
Please advise 

## 2018-01-25 NOTE — Telephone Encounter (Signed)
I did refill tylenol#3.  If she need oxycodone, then we need to have that tylenol#3 cancelled.

## 2018-01-26 ENCOUNTER — Encounter: Payer: Self-pay | Admitting: Internal Medicine

## 2018-01-26 ENCOUNTER — Ambulatory Visit (INDEPENDENT_AMBULATORY_CARE_PROVIDER_SITE_OTHER): Payer: Medicare HMO | Admitting: Internal Medicine

## 2018-01-26 VITALS — BP 106/70 | HR 94 | Temp 97.9°F | Ht 62.0 in | Wt 181.0 lb

## 2018-01-26 DIAGNOSIS — E1140 Type 2 diabetes mellitus with diabetic neuropathy, unspecified: Secondary | ICD-10-CM

## 2018-01-26 DIAGNOSIS — I10 Essential (primary) hypertension: Secondary | ICD-10-CM

## 2018-01-26 DIAGNOSIS — Z0000 Encounter for general adult medical examination without abnormal findings: Secondary | ICD-10-CM | POA: Diagnosis not present

## 2018-01-26 DIAGNOSIS — F39 Unspecified mood [affective] disorder: Secondary | ICD-10-CM | POA: Diagnosis not present

## 2018-01-26 DIAGNOSIS — M48062 Spinal stenosis, lumbar region with neurogenic claudication: Secondary | ICD-10-CM | POA: Diagnosis not present

## 2018-01-26 DIAGNOSIS — Z7189 Other specified counseling: Secondary | ICD-10-CM

## 2018-01-26 LAB — HM DIABETES FOOT EXAM

## 2018-01-26 NOTE — Assessment & Plan Note (Signed)
Ongoing anxiety and dysthymia Still on the duloxetine--hopefully helping radiculopathy as well

## 2018-01-26 NOTE — Assessment & Plan Note (Signed)
See social history 

## 2018-01-26 NOTE — Progress Notes (Signed)
Subjective:    Patient ID: [name], female    DOB: 02-01-1946, 72 y.o.   MRN: [id number]  HPI Here for Medicare wellness visit and follow up of chronic health conditions Reviewed form and advanced directives Reviewed other doctors No alcohol or tobacco Doing exercises from therapy at this point Fell once--crossed her feet. No injury Chronic mood issues Vision is okay.  Hearing aides help Independent with instrumental ADLs---still recovering from THR Mild memory issues--nothing worrisome  Recent left hip replacement Had to go to rehab--had a tough time (like getting prn pain meds) Home for a few days  Therapy there was "very good" Neighbor helps her out Just able to drive again Still having pain---?nausea with tylenol with codeine (suggested going back to the tramadol)  Ongoing radicular pain Wonders about trying muscle relaxer instead of gabapentin Discussed my concerns about this  Sugars good Checks only once in a while Usually in 100's Ongoing sensory problems in legs  Some depressed mood still with pain, etc Continues on the duloxetine ---bupropion in the past  Some vague chest pain---relates to indigestion (after eating). This is rare No dysphagia No SOB No dizziness or syncope  Current Outpatient Medications on File Prior to Visit  Medication Sig Dispense Refill  . ACCU-CHEK AVIVA PLUS test strip USE TO TEST BLOOD SUGAR ONCE DAILY DX: R73.03 100 each 3  . ACCU-CHEK SOFTCLIX LANCETS lancets USE AS INSTRUCTED 100 each 3  . Amino Acids (AMINO ACID PO) Take 1 tablet by mouth 2 (two) times daily.    Marland Kitchen aspirin 81 MG chewable tablet Chew 1 tablet (81 mg total) by mouth 2 (two) times daily. 30 tablet 0  . Blood Glucose Monitoring Suppl (ACCU-CHEK AVIVA PLUS) w/Device KIT Use to test blood sugar once daily Dx: R73.03 1 kit 0  . chlorhexidine (PERIDEX) 0.12 % solution RINSE IN THE AM AND PM FOR 30 SECONDS THEN SPIT AS NEEDED FOR MOUTH PAIN  1  . Chromium  Picolinate 1000 MCG TABS Take 1,000 mcg by mouth daily.     . Coenzyme Q10 (CO Q 10) 100 MG CAPS Take 100 mg by mouth daily.     Marland Kitchen docusate sodium (COLACE) 100 MG capsule Take 100 mg by mouth 3 (three) times daily.    . DULoxetine (CYMBALTA) 60 MG capsule Take 1 capsule (60 mg total) by mouth every evening. (Patient taking differently: Take 60 mg by mouth daily. ) 90 capsule 0  . gabapentin (NEURONTIN) 300 MG capsule TAKE TWO CAPSULES BY MOUTH TWICE A DAY TAKE 3 CAPSULES AT BEDTIME (Patient taking differently: Take 600-900 mg by mouth See admin instructions. Take 600 mg by mouth twice daily and take 900 mg by mouth at bedtime) 210 capsule 11  . lisinopril-hydrochlorothiazide (PRINZIDE,ZESTORETIC) 20-25 MG tablet TAKE 2 TABLETS BY MOUTH DAILY 180 tablet 3  . metFORMIN (GLUCOPHAGE-XR) 500 MG 24 hr tablet TAKE 1 TABLET (500 MG TOTAL) BY MOUTH DAILY WITH BREAKFAST. 90 tablet 3  . Multiple Vitamins-Minerals (MULTIVITAMIN WITH MINERALS) tablet Take 1 tablet by mouth daily.     . Omega-3 Fatty Acids (FISH OIL) 1000 MG CAPS Take 1,000 mg by mouth 2 (two) times daily.     . Probiotic Product ([employer] SENIOR PROBIOTIC) CAPS Take 1 capsule by mouth 2 (two) times daily. (Patient taking differently: Take 1 capsule by mouth daily. ) 180 capsule 3  . Soya Lecithin 1200 MG CAPS Take 1,200 mg by mouth 2 (two) times daily.     . SUPER  B COMPLEX/C PO Take 1 tablet by mouth daily.    . traMADol (ULTRAM) 50 MG tablet TAKE 1-2 TABLETS (50-100 MG TOTAL) BY MOUTH 3 (THREE) TIMES DAILY AS NEEDED. 60 tablet 0  . vitamin C (ASCORBIC ACID) 500 MG tablet Take 500 mg by mouth daily.    Marland Kitchen zinc gluconate 50 MG tablet Take 50 mg by mouth daily.     No current facility-administered medications on file prior to visit.     Allergies  Allergen Reactions  . Acyclovir And Related     Past Medical History:  Diagnosis Date  . Arthritis   . Confusion   . Diabetes mellitus without complication ([place])    type 2  . Hepatitis    pt.  denies  . Hypertension   . Lumbar stenosis   . Memory loss   . Nocturia   . Numbness and tingling    legs and feet  . Wears glasses     Past Surgical History:  Procedure Laterality Date  . [place]  . COLONOSCOPY    . HEMORRHOID SURGERY  2015  . JOINT REPLACEMENT     01-08-18 Dr. Kathrynn Speed  . TOTAL HIP ARTHROPLASTY Left 01/08/2018   Procedure: LEFT TOTAL HIP ARTHROPLASTY ANTERIOR APPROACH;  Surgeon: Mcarthur Rossetti, MD;  Location: [facility];  Service: Orthopedics;  Laterality: Left;  . TRANSFORAMINAL LUMBAR INTERBODY FUSION (TLIF) WITH PEDICLE SCREW FIXATION 2 LEVEL N/A 11/07/2015   Procedure: Lumbar three-four Lumbar four-five  transforaminal lumbar interbody fusion with interbody prosthesis posterior lateral arthrodesis and posterior segmental instrumentation;  Surgeon: Newman Pies, MD;  Location: [facility];  Service: Neurosurgery;  Laterality: N/A;    Family History  Problem Relation Age of Onset  . Hypertension Other   . Cancer Other        ovarian  . Cancer Mother   . Heart disease Father     Social History   Socioeconomic History  . Marital status: Divorced    Spouse name: Not on file  . Number of children: 4  . Years of education: Not on file  . Highest education level: Associate degree: occupational, Hotel manager, or vocational program  Occupational History  . Not on file  Social Needs  . Financial resource strain: Not very hard  . Food insecurity:    Worry: Never true    Inability: Never true  . Transportation needs:    Medical: Yes    Non-medical: Yes  Tobacco Use  . Smoking status: Never Smoker  . Smokeless tobacco: Never Used  Substance and Sexual Activity  . Alcohol use: No    Comment: rarely  . Drug use: No  . Sexual activity: Not Currently  Lifestyle  . Physical activity:    Days per week: 0 days    Minutes per session: 0 min  . Stress: To some extent  Relationships  . Social connections:    Talks on phone: Not on file     Gets together: Not on file    Attends religious service: Never    Active member of club or organization: No    Attends meetings of clubs or organizations: Never    Relationship status: Divorced  . Intimate partner violence:    Fear of current or ex partner: No    Emotionally abused: No    Physically abused: No    Forced sexual activity: No  Other Topics Concern  . Not on file  Social History Narrative   Has  a living will.   Son is health care POA   Would accept resuscitation.  Would not want prolonged life support if futile.   Probably would not want tube feeds if cognitively unaware   Review of Systems Leg ulcer healed up Sleeps okay---but has nocturia (especially after taking left over furosemide) Edema is gone now Appetite is fine Weight is up slightly Wars seat belt Bowels are slow--used stool softener. No blood Voids okay No skin rash--just residual bruising Partial denture--- keeps up with dentist    Objective:   Physical Exam  Constitutional: She is oriented to person, place, and time. She appears well-developed.  Some pain from hip  HENT:  Mouth/Throat: Oropharynx is clear and moist. No oropharyngeal exudate.  Neck: No thyromegaly present.  Cardiovascular: Normal rate, regular rhythm, normal heart sounds and intact distal pulses. Exam reveals no gallop.  No murmur heard. Pulmonary/Chest: Effort normal and breath sounds normal. No respiratory distress. She has no wheezes. She has no rales.  Abdominal: Soft. There is no tenderness.  Musculoskeletal:  Trace ankle edema  Lymphadenopathy:    She has no cervical adenopathy.  Neurological: She is alert and oriented to person, place, and time.  President--- "Dwaine Deter, W" [id number] D-l-r-[id number]-w Recall 2/3  Decreased sensation in feet  Skin: No rash noted.  No foot lesions  Psychiatric:  Upset and some anxiety--mostly related to pain          Assessment & Plan:

## 2018-01-26 NOTE — Assessment & Plan Note (Signed)
Chronic pain issues Back on the tramadol

## 2018-01-26 NOTE — Assessment & Plan Note (Signed)
Lab Results  Component Value Date   HGBA1C 6.1 (H) 01/06/2018   Excellent control Ongoing neurologic symptoms feet/legs Discussed statin---will reconsider at next visit

## 2018-01-26 NOTE — Assessment & Plan Note (Signed)
BP Readings from Last 3 Encounters:  01/26/18 106/70  01/12/18 (!) 140/58  01/06/18 134/90   Good control still

## 2018-01-26 NOTE — Assessment & Plan Note (Signed)
I have personally reviewed the Medicare Annual Wellness questionnaire and have noted 1. The patient's medical and social history 2. Their use of alcohol, tobacco or illicit drugs 3. Their current medications and supplements 4. The patient's functional ability including ADL's, fall risks, home safety risks and hearing or visual             impairment. 5. Diet and physical activities 6. Evidence for depression or mood disorders  The patients weight, height, BMI and visual acuity have been recorded in the chart I have made referrals, counseling and provided education to the patient based review of the above and I have provided the pt with a written personalized care plan for preventive services.  I have provided you with a copy of your personalized plan for preventive services. Please take the time to review along with your updated medication list.  Yearly flu vaccine Still prefers no mammograms Colon due 2021 Working on therapy after THR

## 2018-01-26 NOTE — Progress Notes (Signed)
Hearing Screening Comments: Has Hearing Aids, but is not wearing them. Vision Screening Comments: March 2019

## 2018-01-27 ENCOUNTER — Encounter: Payer: Self-pay | Admitting: Internal Medicine

## 2018-01-27 ENCOUNTER — Encounter (INDEPENDENT_AMBULATORY_CARE_PROVIDER_SITE_OTHER): Payer: Self-pay | Admitting: Orthopaedic Surgery

## 2018-01-27 NOTE — Progress Notes (Signed)
Alexis, WEATHERHOLTZ Velez ([id number]) Visit Report for 01/21/2018 Arrival Information Details Patient Name: [name], [name]. Date of Service: 01/21/2018 3:00 PM Medical Record Number: [id number] Patient Account Number: [id number] Date of Birth/Sex: 1946/07/11 (72 y.o. F) Treating RN: Secundino Ginger Primary Care Maisen Klingler: Viviana Simpler Other Clinician: Referring Teresea Donley: Viviana Simpler Treating Shelah Heatley/Extender: Cathie Olden in Treatment: 16 Visit Information History Since Last Visit All ordered tests and consults were completed: Yes Patient Arrived: Ambulatory Added or deleted any medications: Yes Arrival Time: 15:30 Any new allergies or adverse reactions: No Accompanied By: self Had a fall or experienced change in No Transfer Assistance: None activities of daily living that may affect Patient Requires Transmission-Based No risk of falls: Precautions: Signs or symptoms of abuse/neglect since last visito No Patient Has Alerts: Yes Hospitalized since last visit: Yes Patient Alerts: DM II Implantable device outside of the clinic excluding No cellular tissue based products placed in the center since last visit: Has Compression in Place as Prescribed: No Pain Present Now: No Electronic Signature(s) Signed: 01/26/2018 3:46:25 PM By: Secundino Ginger Entered By: Secundino Ginger on 01/21/2018 15:33:00 Banfield, [name] ([id number]) -------------------------------------------------------------------------------- Clinic Level of Care Assessment Details Patient Name: [name]. Date of Service: 01/21/2018 3:00 PM Medical Record Number: [id number] Patient Account Number: [id number] Date of Birth/Sex: 11/08/1945 (72 y.o. F) Treating RN: Ahmed Prima Primary Care Tiffanny Lamarche: Viviana Simpler Other Clinician: Referring Myiah Petkus: Viviana Simpler Treating Gerlene Glassburn/Extender: Cathie Olden in Treatment: 16 Clinic Level of Care Assessment Items TOOL 2 Quantity Score X - Use when only an EandM  is performed on the INITIAL visit 1 0 ASSESSMENTS - Nursing Assessment / Reassessment X - General Physical Exam (combine w/ comprehensive assessment (listed just below) when 1 20 performed on new pt. evals) X- 1 25 Comprehensive Assessment (HX, ROS, Risk Assessments, Wounds Hx, etc.) ASSESSMENTS - Wound and Skin Assessment / Reassessment []  - Simple Wound Assessment / Reassessment - one wound 0 []  - 0 Complex Wound Assessment / Reassessment - multiple wounds []  - 0 Dermatologic / Skin Assessment (not related to wound area) ASSESSMENTS - Ostomy and/or Continence Assessment and Care []  - Incontinence Assessment and Management 0 []  - 0 Ostomy Care Assessment and Management (repouching, etc.) PROCESS - Coordination of Care X - Simple Patient / Family Education for ongoing care 1 15 []  - 0 Complex (extensive) Patient / Family Education for ongoing care []  - 0 Staff obtains Programmer, systems, Records, Test Results / Process Orders []  - 0 Staff telephones HHA, Nursing Homes / Clarify orders / etc []  - 0 Routine Transfer to another Facility (non-emergent condition) []  - 0 Routine Hospital Admission (non-emergent condition) X- 1 15 New Admissions / Biomedical engineer / Ordering NPWT, Apligraf, etc. []  - 0 Emergency Hospital Admission (emergent condition) X- 1 10 Simple Discharge Coordination []  - 0 Complex (extensive) Discharge Coordination PROCESS - Special Needs []  - Pediatric / Minor Patient Management 0 []  - 0 Isolation Patient Management ZURIEL, YEAMAN ([id number]) []  - 0 Hearing / Language / Visual special needs []  - 0 Assessment of Community assistance (transportation, D/C planning, etc.) []  - 0 Additional assistance / Altered mentation []  - 0 Support Surface(s) Assessment (bed, cushion, seat, etc.) INTERVENTIONS - Wound Cleansing / Measurement []  - Wound Imaging (photographs - any number of wounds) 0 []  - 0 Wound Tracing (instead of photographs) []  - 0 Simple  Wound Measurement - one wound []  - 0 Complex Wound Measurement - multiple wounds []  - 0 Simple Wound Cleansing - one  wound []  - 0 Complex Wound Cleansing - multiple wounds INTERVENTIONS - Wound Dressings []  - Small Wound Dressing one or multiple wounds 0 []  - 0 Medium Wound Dressing one or multiple wounds []  - 0 Large Wound Dressing one or multiple wounds []  - 0 Application of Medications - injection INTERVENTIONS - Miscellaneous []  - External ear exam 0 []  - 0 Specimen Collection (cultures, biopsies, blood, body fluids, etc.) []  - 0 Specimen(s) / Culture(s) sent or taken to Lab for analysis []  - 0 Patient Transfer (multiple staff / Civil Service fast streamer / Similar devices) []  - 0 Simple Staple / Suture removal (25 or less) []  - 0 Complex Staple / Suture removal (26 or more) []  - 0 Hypo / Hyperglycemic Management (close monitor of Blood Glucose) []  - 0 Ankle / Brachial Index (ABI) - do not check if billed separately Has the patient been seen at the hospital within the last three years: Yes Total Score: 85 Level Of Care: New/Established - Level 3 Electronic Signature(s) Signed: 01/22/2018 4:38:26 PM By: Alric Quan Entered By: Alric Quan on 01/21/2018 16:10:35 Edling, [name] ([id number]) -------------------------------------------------------------------------------- Lower Extremity Assessment Details Patient Name: [name]. Date of Service: 01/21/2018 3:00 PM Medical Record Number: [id number] Patient Account Number: [id number] Date of Birth/Sex: 1946/01/10 (72 y.o. F) Treating RN: Secundino Ginger Primary Care Jaice Lague: Viviana Simpler Other Clinician: Referring Ryler Laskowski: Viviana Simpler Treating Jazlynne Milliner/Extender: Cathie Olden in Treatment: 16 Edema Assessment Assessed: [Left: No] Patrice Paradise: No] Edema: [Left: Yes] [Right: Yes] Calf Left: Right: Point of Measurement: 32 cm From Medial Instep 41.2 cm 38.5 cm Ankle Left: Right: Point of Measurement: 10 cm From  Medial Instep 28 cm 26 cm Vascular Assessment Pulses: Dorsalis Pedis Palpable: [Left:Yes] [Right:Yes] Posterior Tibial Extremity colors, hair growth, and conditions: Extremity Color: [Left:Normal] [Right:Normal] Hair Growth on Extremity: [Left:Yes] [Right:Yes] Temperature of Extremity: [Left:Cool] [Right:Cool] Capillary Refill: [Left:< 3 seconds] [Right:< 3 seconds] Toe Nail Assessment Left: Right: Thick: Yes Yes Discolored: No No Deformed: No No Improper Length and Hygiene: No No Electronic Signature(s) Signed: 01/26/2018 3:46:25 PM By: Secundino Ginger Entered By: Secundino Ginger on 01/21/2018 15:40:08 Aldridge, [name] ([id number]) -------------------------------------------------------------------------------- Multi Wound Chart Details Patient Name: [name]. Date of Service: 01/21/2018 3:00 PM Medical Record Number: [id number] Patient Account Number: [id number] Date of Birth/Sex: Oct 18, 1945 (72 y.o. F) Treating RN: Ahmed Prima Primary Care Srijan Givan: Viviana Simpler Other Clinician: Referring Addilyn Satterwhite: Viviana Simpler Treating Onofrio Klemp/Extender: Cathie Olden in Treatment: 16 Vital Signs Height(in): 61 Pulse(bpm): 91 Weight(lbs): 179.1 Blood Pressure(mmHg): 121/66 Body Mass Index(BMI): 34 Temperature(F): 97.6 Respiratory Rate 18 (breaths/min): Wound Assessments Treatment Notes Electronic Signature(s) Signed: 01/21/2018 4:15:41 PM By: Lawanda Cousins Entered By: Lawanda Cousins on 01/21/2018 16:15:41 [name] ([id number]) -------------------------------------------------------------------------------- [place] Details Patient Name: [name]. Date of Service: 01/21/2018 3:00 PM Medical Record Number: [id number] Patient Account Number: [id number] Date of Birth/Sex: 09-Dec-1945 (72 y.o. F) Treating RN: Secundino Ginger Primary Care Nattaly Yebra: Viviana Simpler Other Clinician: Referring Dotty Gonzalo: Viviana Simpler Treating Rohan Juenger/Extender:  Cathie Olden in Treatment: 16 Active Inactive Electronic Signature(s) Signed: 01/21/2018 4:09:13 PM By: Alric Quan Signed: 01/26/2018 3:46:25 PM By: Secundino Ginger Entered By: Alric Quan on 01/21/2018 16:09:12 Rama, [name] ([id number]) -------------------------------------------------------------------------------- Pain Assessment Details Patient Name: [name]. Date of Service: 01/21/2018 3:00 PM Medical Record Number: [id number] Patient Account Number: [id number] Date of Birth/Sex: August 25, 1946 (72 y.o. F) Treating RN: Secundino Ginger Primary Care Ander Wamser: Viviana Simpler Other Clinician: Referring Aliese Brannum: Viviana Simpler Treating Raju Coppolino/Extender: Cathie Olden  in Treatment: 16 Active Problems Location of Pain Severity and Description of Pain Patient Has Paino No Site Locations Pain Management and Medication Current Pain Management: Electronic Signature(s) Signed: 01/26/2018 3:46:25 PM By: Secundino Ginger Entered By: Secundino Ginger on 01/21/2018 15:36:35 Studzinski, [name] ([id number]) -------------------------------------------------------------------------------- [place] Details Patient Name: [name]. Date of Service: 01/21/2018 3:00 PM Medical Record Number: [id number] Patient Account Number: [id number] Date of Birth/Sex: 1946-01-17 (72 y.o. F) Treating RN: Secundino Ginger Primary Care Patsie Mccardle: Viviana Simpler Other Clinician: Referring Kandon Hosking: Viviana Simpler Treating Evaline Waltman/Extender: Cathie Olden in Treatment: 16 Vital Signs Time Taken: 15:37 Temperature (F): 97.6 Height (in): 61 Pulse (bpm): 91 Weight (lbs): 179.1 Respiratory Rate (breaths/min): 18 Body Mass Index (BMI): 33.8 Blood Pressure (mmHg): 121/66 Reference Range: 80 - 120 mg / dl Electronic Signature(s) Signed: 01/26/2018 3:46:25 PM By: Secundino Ginger Entered By: Secundino Ginger on 01/21/2018 15:37:44

## 2018-01-30 DIAGNOSIS — Z471 Aftercare following joint replacement surgery: Secondary | ICD-10-CM | POA: Diagnosis not present

## 2018-01-30 DIAGNOSIS — I10 Essential (primary) hypertension: Secondary | ICD-10-CM | POA: Diagnosis not present

## 2018-01-30 DIAGNOSIS — F3010 Manic episode without psychotic symptoms, unspecified: Secondary | ICD-10-CM | POA: Diagnosis not present

## 2018-01-30 DIAGNOSIS — R351 Nocturia: Secondary | ICD-10-CM | POA: Diagnosis not present

## 2018-01-30 DIAGNOSIS — E119 Type 2 diabetes mellitus without complications: Secondary | ICD-10-CM | POA: Diagnosis not present

## 2018-01-30 DIAGNOSIS — M48061 Spinal stenosis, lumbar region without neurogenic claudication: Secondary | ICD-10-CM | POA: Diagnosis not present

## 2018-02-02 ENCOUNTER — Telehealth (INDEPENDENT_AMBULATORY_CARE_PROVIDER_SITE_OTHER): Payer: Self-pay | Admitting: Orthopaedic Surgery

## 2018-02-02 NOTE — Telephone Encounter (Signed)
[facility] orders  [phone]  One time a week for one week   Three times a week for three weeks

## 2018-02-02 NOTE — Telephone Encounter (Signed)
Verbal order given  

## 2018-02-03 DIAGNOSIS — M48061 Spinal stenosis, lumbar region without neurogenic claudication: Secondary | ICD-10-CM | POA: Diagnosis not present

## 2018-02-03 DIAGNOSIS — I10 Essential (primary) hypertension: Secondary | ICD-10-CM | POA: Diagnosis not present

## 2018-02-03 DIAGNOSIS — R351 Nocturia: Secondary | ICD-10-CM | POA: Diagnosis not present

## 2018-02-03 DIAGNOSIS — E119 Type 2 diabetes mellitus without complications: Secondary | ICD-10-CM | POA: Diagnosis not present

## 2018-02-03 DIAGNOSIS — F3010 Manic episode without psychotic symptoms, unspecified: Secondary | ICD-10-CM | POA: Diagnosis not present

## 2018-02-03 DIAGNOSIS — Z471 Aftercare following joint replacement surgery: Secondary | ICD-10-CM | POA: Diagnosis not present

## 2018-02-04 DIAGNOSIS — H43392 Other vitreous opacities, left eye: Secondary | ICD-10-CM | POA: Diagnosis not present

## 2018-02-04 DIAGNOSIS — H43812 Vitreous degeneration, left eye: Secondary | ICD-10-CM | POA: Diagnosis not present

## 2018-02-04 DIAGNOSIS — F3010 Manic episode without psychotic symptoms, unspecified: Secondary | ICD-10-CM | POA: Diagnosis not present

## 2018-02-04 DIAGNOSIS — E119 Type 2 diabetes mellitus without complications: Secondary | ICD-10-CM | POA: Diagnosis not present

## 2018-02-04 DIAGNOSIS — Z471 Aftercare following joint replacement surgery: Secondary | ICD-10-CM | POA: Diagnosis not present

## 2018-02-04 DIAGNOSIS — I10 Essential (primary) hypertension: Secondary | ICD-10-CM | POA: Diagnosis not present

## 2018-02-04 DIAGNOSIS — M48061 Spinal stenosis, lumbar region without neurogenic claudication: Secondary | ICD-10-CM | POA: Diagnosis not present

## 2018-02-04 DIAGNOSIS — R351 Nocturia: Secondary | ICD-10-CM | POA: Diagnosis not present

## 2018-02-05 ENCOUNTER — Telehealth: Payer: Self-pay | Admitting: Internal Medicine

## 2018-02-05 DIAGNOSIS — Z471 Aftercare following joint replacement surgery: Secondary | ICD-10-CM | POA: Diagnosis not present

## 2018-02-05 DIAGNOSIS — M48061 Spinal stenosis, lumbar region without neurogenic claudication: Secondary | ICD-10-CM | POA: Diagnosis not present

## 2018-02-05 DIAGNOSIS — I10 Essential (primary) hypertension: Secondary | ICD-10-CM | POA: Diagnosis not present

## 2018-02-05 DIAGNOSIS — R351 Nocturia: Secondary | ICD-10-CM | POA: Diagnosis not present

## 2018-02-05 DIAGNOSIS — F3010 Manic episode without psychotic symptoms, unspecified: Secondary | ICD-10-CM | POA: Diagnosis not present

## 2018-02-05 DIAGNOSIS — E119 Type 2 diabetes mellitus without complications: Secondary | ICD-10-CM | POA: Diagnosis not present

## 2018-02-05 NOTE — Telephone Encounter (Signed)
She does not need a referral and insurance will not pay for this. She has to contract for it independently

## 2018-02-05 NOTE — Telephone Encounter (Signed)
Pt seen 01/26/18 for annual exam.Please advise.

## 2018-02-05 NOTE — Telephone Encounter (Signed)
Spoke to pt. Explained to her that without a Skilled Nursing need, insurance will not provide an aide to help 1-2 days a week. [facility] advised her different.

## 2018-02-05 NOTE — Telephone Encounter (Signed)
Copied from [place] [id number]. Topic: Quick Communication - See Telephone Encounter >> Feb 05, 2018 10:23 AM Neva Seat wrote: Pt is requesting a referral for a home health nurse - bathing and other issues  Asking for [facility]

## 2018-02-08 ENCOUNTER — Other Ambulatory Visit: Payer: Self-pay | Admitting: Internal Medicine

## 2018-02-08 NOTE — Telephone Encounter (Signed)
Last filled 01-25-18 #60 Last OV 01-26-18 Next OV 04-28-18  I confirmed with her that she did request the refill. Michela Pitcher it really helps her pain.

## 2018-02-09 DIAGNOSIS — I10 Essential (primary) hypertension: Secondary | ICD-10-CM | POA: Diagnosis not present

## 2018-02-09 DIAGNOSIS — M48061 Spinal stenosis, lumbar region without neurogenic claudication: Secondary | ICD-10-CM | POA: Diagnosis not present

## 2018-02-09 DIAGNOSIS — R351 Nocturia: Secondary | ICD-10-CM | POA: Diagnosis not present

## 2018-02-09 DIAGNOSIS — Z471 Aftercare following joint replacement surgery: Secondary | ICD-10-CM | POA: Diagnosis not present

## 2018-02-09 DIAGNOSIS — E119 Type 2 diabetes mellitus without complications: Secondary | ICD-10-CM | POA: Diagnosis not present

## 2018-02-09 DIAGNOSIS — F3010 Manic episode without psychotic symptoms, unspecified: Secondary | ICD-10-CM | POA: Diagnosis not present

## 2018-02-11 DIAGNOSIS — F3010 Manic episode without psychotic symptoms, unspecified: Secondary | ICD-10-CM | POA: Diagnosis not present

## 2018-02-11 DIAGNOSIS — R351 Nocturia: Secondary | ICD-10-CM | POA: Diagnosis not present

## 2018-02-11 DIAGNOSIS — I10 Essential (primary) hypertension: Secondary | ICD-10-CM | POA: Diagnosis not present

## 2018-02-11 DIAGNOSIS — M48061 Spinal stenosis, lumbar region without neurogenic claudication: Secondary | ICD-10-CM | POA: Diagnosis not present

## 2018-02-11 DIAGNOSIS — Z471 Aftercare following joint replacement surgery: Secondary | ICD-10-CM | POA: Diagnosis not present

## 2018-02-11 DIAGNOSIS — E119 Type 2 diabetes mellitus without complications: Secondary | ICD-10-CM | POA: Diagnosis not present

## 2018-02-12 DIAGNOSIS — R351 Nocturia: Secondary | ICD-10-CM | POA: Diagnosis not present

## 2018-02-12 DIAGNOSIS — F3010 Manic episode without psychotic symptoms, unspecified: Secondary | ICD-10-CM | POA: Diagnosis not present

## 2018-02-12 DIAGNOSIS — Z471 Aftercare following joint replacement surgery: Secondary | ICD-10-CM | POA: Diagnosis not present

## 2018-02-12 DIAGNOSIS — E119 Type 2 diabetes mellitus without complications: Secondary | ICD-10-CM | POA: Diagnosis not present

## 2018-02-12 DIAGNOSIS — M48061 Spinal stenosis, lumbar region without neurogenic claudication: Secondary | ICD-10-CM | POA: Diagnosis not present

## 2018-02-12 DIAGNOSIS — I10 Essential (primary) hypertension: Secondary | ICD-10-CM | POA: Diagnosis not present

## 2018-02-16 ENCOUNTER — Encounter: Payer: Self-pay | Admitting: Internal Medicine

## 2018-02-16 DIAGNOSIS — I10 Essential (primary) hypertension: Secondary | ICD-10-CM | POA: Diagnosis not present

## 2018-02-16 DIAGNOSIS — R351 Nocturia: Secondary | ICD-10-CM | POA: Diagnosis not present

## 2018-02-16 DIAGNOSIS — F3010 Manic episode without psychotic symptoms, unspecified: Secondary | ICD-10-CM | POA: Diagnosis not present

## 2018-02-16 DIAGNOSIS — M48061 Spinal stenosis, lumbar region without neurogenic claudication: Secondary | ICD-10-CM | POA: Diagnosis not present

## 2018-02-16 DIAGNOSIS — E119 Type 2 diabetes mellitus without complications: Secondary | ICD-10-CM | POA: Diagnosis not present

## 2018-02-16 DIAGNOSIS — Z471 Aftercare following joint replacement surgery: Secondary | ICD-10-CM | POA: Diagnosis not present

## 2018-02-17 DIAGNOSIS — M48061 Spinal stenosis, lumbar region without neurogenic claudication: Secondary | ICD-10-CM | POA: Diagnosis not present

## 2018-02-17 DIAGNOSIS — I10 Essential (primary) hypertension: Secondary | ICD-10-CM | POA: Diagnosis not present

## 2018-02-17 DIAGNOSIS — Z471 Aftercare following joint replacement surgery: Secondary | ICD-10-CM | POA: Diagnosis not present

## 2018-02-17 DIAGNOSIS — F3010 Manic episode without psychotic symptoms, unspecified: Secondary | ICD-10-CM | POA: Diagnosis not present

## 2018-02-17 DIAGNOSIS — E119 Type 2 diabetes mellitus without complications: Secondary | ICD-10-CM | POA: Diagnosis not present

## 2018-02-17 DIAGNOSIS — R351 Nocturia: Secondary | ICD-10-CM | POA: Diagnosis not present

## 2018-02-18 ENCOUNTER — Other Ambulatory Visit (INDEPENDENT_AMBULATORY_CARE_PROVIDER_SITE_OTHER): Payer: Self-pay

## 2018-02-18 ENCOUNTER — Ambulatory Visit (INDEPENDENT_AMBULATORY_CARE_PROVIDER_SITE_OTHER): Payer: Medicare HMO | Admitting: Physician Assistant

## 2018-02-18 ENCOUNTER — Encounter (INDEPENDENT_AMBULATORY_CARE_PROVIDER_SITE_OTHER): Payer: Self-pay | Admitting: Physician Assistant

## 2018-02-18 DIAGNOSIS — Z96642 Presence of left artificial hip joint: Secondary | ICD-10-CM

## 2018-02-18 NOTE — Progress Notes (Signed)
[name] returns today 41 days status post left total hip arthroplasty.  She is overall doing well she is ambulating with a walker.  Increased range of motion of the hip.  She is working on has some weakness of her left hip flexor.  She has weakness with flexion of her hip when sitting.  Denies any chest pain shortness of breath fevers chills.  Physical exam: General well-developed well-nourished female no acute distress made affect appropriate.  Psych alert and oriented x3 Left hip: Surgical incisions healing well.  Good range of motion with internal and external rotation.  She does have some weakness but is able to initiate hip flexion on the left.  She has full dorsiflexion plantarflexion ankle.  Full extension of the knee actively.  Calf supple nontender.  Impression: Status post left total hip arthroplasty 41 days  Plan: She will continue work on strengthening of the left hip particularly hip flexors.  We will send her to physical therapy to work on this.  She will follow-up with [place] in a month check progress lack of.  Scar tissue mobilization encouraged.  Questions encouraged and answered

## 2018-02-19 DIAGNOSIS — R351 Nocturia: Secondary | ICD-10-CM | POA: Diagnosis not present

## 2018-02-19 DIAGNOSIS — E119 Type 2 diabetes mellitus without complications: Secondary | ICD-10-CM | POA: Diagnosis not present

## 2018-02-19 DIAGNOSIS — Z471 Aftercare following joint replacement surgery: Secondary | ICD-10-CM | POA: Diagnosis not present

## 2018-02-19 DIAGNOSIS — I10 Essential (primary) hypertension: Secondary | ICD-10-CM | POA: Diagnosis not present

## 2018-02-19 DIAGNOSIS — M48061 Spinal stenosis, lumbar region without neurogenic claudication: Secondary | ICD-10-CM | POA: Diagnosis not present

## 2018-02-19 DIAGNOSIS — F3010 Manic episode without psychotic symptoms, unspecified: Secondary | ICD-10-CM | POA: Diagnosis not present

## 2018-02-20 ENCOUNTER — Encounter: Payer: Self-pay | Admitting: Internal Medicine

## 2018-02-28 ENCOUNTER — Encounter: Payer: Self-pay | Admitting: Internal Medicine

## 2018-02-28 DIAGNOSIS — M5416 Radiculopathy, lumbar region: Principal | ICD-10-CM

## 2018-02-28 DIAGNOSIS — M48061 Spinal stenosis, lumbar region without neurogenic claudication: Secondary | ICD-10-CM

## 2018-03-04 ENCOUNTER — Encounter: Payer: Self-pay | Admitting: Internal Medicine

## 2018-03-05 ENCOUNTER — Ambulatory Visit (INDEPENDENT_AMBULATORY_CARE_PROVIDER_SITE_OTHER): Payer: Medicare HMO | Admitting: Internal Medicine

## 2018-03-05 ENCOUNTER — Encounter: Payer: Self-pay | Admitting: Internal Medicine

## 2018-03-05 VITALS — BP 120/70 | HR 77 | Temp 98.6°F | Ht 62.0 in | Wt 186.0 lb

## 2018-03-05 DIAGNOSIS — G894 Chronic pain syndrome: Secondary | ICD-10-CM | POA: Diagnosis not present

## 2018-03-05 NOTE — Progress Notes (Signed)
Subjective:    Patient ID: [name], female    DOB: 30-Nov-1945, 72 y.o.   MRN: [id number]  HPI Here due to right hip and leg pain  Doing okay with left hip since the surgery This is not hurting her  Now having bilateral posterior thigh and calf pain Right is not necessarily any worse Pain medicine does help--the tramadol makes a big difference (170m usually bid) Did increase the duloxetine to 622mbid some time ago On gabapentin 600 bid and 90021mt bedtime  No pain if sitting Does notice it in bed----legs "just kill" Has to rub them a lot (but the tramadol helps this) Does get worse when walking around  Current Outpatient Medications on File Prior to Visit  Medication Sig Dispense Refill  . ACCU-CHEK AVIVA PLUS test strip USE TO TEST BLOOD SUGAR ONCE DAILY DX: R73.03 100 each 3  . ACCU-CHEK SOFTCLIX LANCETS lancets USE AS INSTRUCTED 100 each 3  . Amino Acids (AMINO ACID PO) Take 1 tablet by mouth 2 (two) times daily.    . aMarland Kitchenpirin 81 MG chewable tablet Chew 1 tablet (81 mg total) by mouth 2 (two) times daily. 30 tablet 0  . Blood Glucose Monitoring Suppl (ACCU-CHEK AVIVA PLUS) w/Device KIT Use to test blood sugar once daily Dx: R73.03 1 kit 0  . Chromium Picolinate 1000 MCG TABS Take 1,000 mcg by mouth daily.     . Coenzyme Q10 (CO Q 10) 100 MG CAPS Take 100 mg by mouth daily.     . DULoxetine (CYMBALTA) 60 MG capsule Take 1 capsule (60 mg total) by mouth every evening. (Patient taking differently: Take 120 mg by mouth daily. ) 90 capsule 0  . gabapentin (NEURONTIN) 300 MG capsule TAKE TWO CAPSULES BY MOUTH TWICE A DAY TAKE 3 CAPSULES AT BEDTIME (Patient taking differently: Take 600-900 mg by mouth See admin instructions. Take 600 mg by mouth twice daily and take 900 mg by mouth at bedtime) 210 capsule 11  . lisinopril-hydrochlorothiazide (PRINZIDE,ZESTORETIC) 20-25 MG tablet TAKE 2 TABLETS BY MOUTH DAILY 180 tablet 3  . metFORMIN (GLUCOPHAGE-XR) 500 MG 24 hr tablet TAKE 1  TABLET (500 MG TOTAL) BY MOUTH DAILY WITH BREAKFAST. 90 tablet 3  . Multiple Vitamins-Minerals (MULTIVITAMIN WITH MINERALS) tablet Take 1 tablet by mouth daily.     . Omega-3 Fatty Acids (FISH OIL) 1000 MG CAPS Take 1,000 mg by mouth 2 (two) times daily.     . Probiotic Product ([employer] SENIOR PROBIOTIC) CAPS Take 1 capsule by mouth 2 (two) times daily. (Patient taking differently: Take 1 capsule by mouth daily. ) 180 capsule 3  . Soya Lecithin 1200 MG CAPS Take 1,200 mg by mouth 2 (two) times daily.     . SUPER B COMPLEX/C PO Take 1 tablet by mouth daily.    . traMADol (ULTRAM) 50 MG tablet TAKE 1-2 TABLETS (50-100 MG TOTAL) BY MOUTH 3 (THREE) TIMES DAILY AS NEEDED. 90 tablet 0  . vitamin C (ASCORBIC ACID) 500 MG tablet Take 500 mg by mouth daily.     No current facility-administered medications on file prior to visit.     Allergies  Allergen Reactions  . Acyclovir And Related     Past Medical History:  Diagnosis Date  . Arthritis   . Confusion   . Diabetes mellitus without complication ([place]  type 2  . Hepatitis    pt. denies  . Hypertension   . Lumbar stenosis   . Memory loss   .  Nocturia   . Numbness and tingling    legs and feet  . Wears glasses     Past Surgical History:  Procedure Laterality Date  . [place]  . COLONOSCOPY    . HEMORRHOID SURGERY  2015  . JOINT REPLACEMENT     01-08-18 Dr. Kathrynn Speed  . TOTAL HIP ARTHROPLASTY Left 01/08/2018   Procedure: LEFT TOTAL HIP ARTHROPLASTY ANTERIOR APPROACH;  Surgeon: Mcarthur Rossetti, MD;  Location: [facility];  Service: Orthopedics;  Laterality: Left;  . TRANSFORAMINAL LUMBAR INTERBODY FUSION (TLIF) WITH PEDICLE SCREW FIXATION 2 LEVEL N/A 11/07/2015   Procedure: Lumbar three-four Lumbar four-five  transforaminal lumbar interbody fusion with interbody prosthesis posterior lateral arthrodesis and posterior segmental instrumentation;  Surgeon: Newman Pies, MD;  Location: [place] [facility];  Service: Neurosurgery;   Laterality: N/A;    Family History  Problem Relation Age of Onset  . Hypertension Other   . Cancer Other        ovarian  . Cancer Mother   . Heart disease Father     Social History   Socioeconomic History  . Marital status: Divorced    Spouse name: Not on file  . Number of children: 4  . Years of education: Not on file  . Highest education level: Associate degree: occupational, Hotel manager, or vocational program  Occupational History  . Not on file  Social Needs  . Financial resource strain: Not very hard  . Food insecurity:    Worry: Never true    Inability: Never true  . Transportation needs:    Medical: Yes    Non-medical: Yes  Tobacco Use  . Smoking status: Never Smoker  . Smokeless tobacco: Never Used  Substance and Sexual Activity  . Alcohol use: No    Comment: rarely  . Drug use: No  . Sexual activity: Not Currently  Lifestyle  . Physical activity:    Days per week: 0 days    Minutes per session: 0 min  . Stress: To some extent  Relationships  . Social connections:    Talks on phone: Not on file    Gets together: Not on file    Attends religious service: Never    Active member of club or organization: No    Attends meetings of clubs or organizations: Never    Relationship status: Divorced  . Intimate partner violence:    Fear of current or ex partner: No    Emotionally abused: No    Physically abused: No    Forced sexual activity: No  Other Topics Concern  . Not on file  Social History Narrative   Has a living will.   Son is health care POA   Would accept resuscitation.  Would not want prolonged life support if futile.   Probably would not want tube feeds if cognitively unaware   Review of Systems  Trying to walk with the rollator lately Eating okay Sleeps okay with the medications     Objective:   Physical Exam  Constitutional: She appears well-developed. No distress.  Musculoskeletal:  Normal ROM in both hips without pain No spine  tenderness  Neurological:  Mild decreased left quad strength--but pretty good           Assessment & Plan:

## 2018-03-05 NOTE — Assessment & Plan Note (Signed)
Ongoing pain suggestive of neurogenic claudication  Some relief with tramadol---up to 300mg  daily Duloxetine 120mg  daily Wonders about a 'nerve cutting procedure" she has heard of. I don't think that she would be a candidate for this but recommended she discuss this with Dr Arnoldo Morale or Dr Asa Lente. Will cc this note to Dr Arnoldo Morale

## 2018-03-09 ENCOUNTER — Other Ambulatory Visit: Payer: Self-pay | Admitting: Internal Medicine

## 2018-03-09 NOTE — Telephone Encounter (Signed)
Name of Medication: tramadol Name of Pharmacy: [facility] or Written Date and Quantity:#90 on 02/08/18  Last Office Visit and Type: 03/05/18 Next Office Visit and Type: 04/28/18 3 mth f/u

## 2018-03-09 NOTE — Telephone Encounter (Signed)
Copied from [place] [id number]. Topic: Quick Communication - Rx Refill/Question >> Mar 09, 2018 10:56 AM Burchel, Abbi R wrote: Medication: traMADol (ULTRAM) 50 MG tablet  Has the patient contacted their pharmacy? Yes.   [id number] [name], [place] [phone] (Phone) [phone] (Fax)   Pt has 1 day supply remaining.   Pt was advised that RX refills may take up to 3 business days.

## 2018-03-09 NOTE — Telephone Encounter (Signed)
Tramadol refill Last Refill:02/08/18 #90 Last OV: 03/05/18 PCP: Dr. Silvio Pate [facility] [name]

## 2018-03-10 ENCOUNTER — Encounter: Payer: Self-pay | Admitting: Internal Medicine

## 2018-03-10 DIAGNOSIS — R531 Weakness: Secondary | ICD-10-CM | POA: Diagnosis not present

## 2018-03-10 DIAGNOSIS — R5381 Other malaise: Secondary | ICD-10-CM | POA: Diagnosis not present

## 2018-03-10 MED ORDER — TRAMADOL HCL 50 MG PO TABS: 50.0000 mg | ORAL_TABLET | Freq: Three times a day (TID) | ORAL | 0 refills | Status: DC | PRN

## 2018-03-12 ENCOUNTER — Encounter: Payer: Self-pay | Admitting: Internal Medicine

## 2018-03-12 DIAGNOSIS — M48062 Spinal stenosis, lumbar region with neurogenic claudication: Secondary | ICD-10-CM

## 2018-03-15 DIAGNOSIS — M25552 Pain in left hip: Secondary | ICD-10-CM | POA: Diagnosis not present

## 2018-03-18 DIAGNOSIS — M25552 Pain in left hip: Secondary | ICD-10-CM | POA: Diagnosis not present

## 2018-03-22 ENCOUNTER — Ambulatory Visit (INDEPENDENT_AMBULATORY_CARE_PROVIDER_SITE_OTHER): Payer: Self-pay | Admitting: Physician Assistant

## 2018-03-23 ENCOUNTER — Encounter (INDEPENDENT_AMBULATORY_CARE_PROVIDER_SITE_OTHER): Payer: Self-pay | Admitting: Physician Assistant

## 2018-03-23 ENCOUNTER — Encounter: Payer: Self-pay | Admitting: Internal Medicine

## 2018-03-26 ENCOUNTER — Encounter: Payer: Self-pay | Admitting: Internal Medicine

## 2018-03-26 DIAGNOSIS — M25552 Pain in left hip: Secondary | ICD-10-CM | POA: Diagnosis not present

## 2018-03-27 ENCOUNTER — Other Ambulatory Visit: Payer: Self-pay | Admitting: Internal Medicine

## 2018-03-29 ENCOUNTER — Encounter: Payer: Self-pay | Admitting: Internal Medicine

## 2018-03-29 ENCOUNTER — Other Ambulatory Visit: Payer: Self-pay | Admitting: Internal Medicine

## 2018-03-29 ENCOUNTER — Encounter: Payer: Self-pay | Admitting: *Deleted

## 2018-03-29 NOTE — Telephone Encounter (Signed)
MyChart message sent to patient.

## 2018-03-29 NOTE — Telephone Encounter (Signed)
PLEASE NOTE: All timestamps contained within this report are represented as [place] Standard Time. CONFIDENTIALTY NOTICE: This fax transmission is intended only for the addressee. It contains information that is legally privileged, confidential or otherwise protected from use or disclosure. If you are not the intended recipient, you are strictly prohibited from reviewing, disclosing, copying using or disseminating any of this information or taking any action in reliance on or regarding this information. If you have received this fax in error, please notify us immediately by telephone so that we can arrange for its return to [place]. Phone: [phone], Toll-Free: [phone], Fax: [phone] Page: 1 of 1 Call Id: [id number] [place] Patient Name: [name] Gender: Female DOB: [birth year] Age: 72 Y 10 M 24 D Return Phone Number: [phone] (Primary), [phone] (Secondary) Address[place] [address] Client [facility] Night - Client Client Site [place] Physician Viviana Simpler - MD Contact Type Call Who Is Calling Patient / Member / Family / Caregiver Call Type Triage / Clinical Relationship To Patient Self Return Phone Number [phone] (Primary) Chief Complaint Prescription Refill or Medication Request (non symptomatic) Reason for Call Medication Question / Request Initial Comment Caller states she needs a refill on her Tramadol. She will be out of the medication tomorrow. She tried to request the refill on mychart. Translation No Nurse Assessment Nurse: Cherre Robins, RN, Ria Comment Date/Time (Eastern Time): 03/27/2018 1:42:25 PM Confirm and document reason for call. If symptomatic, describe symptoms. ---Caller states that she is calling to request a refill on her medication- states she has enough to last until  Monday. Not calling about sx is only calling about the medication refill. Does the patient have any new or worsening symptoms? ---No Please document clinical information provided and list any resource used. ---RN advised caller rx refills are not taken care after hours and that she should call the office Monday morning when it opens at 7:30 to request her refill at that time. Caller voiced understanding. Guidelines Guideline Title Affirmed Question Affirmed Notes Nurse Date/Time (Eastern Time) Disp. Time Eilene Ghazi Time) Disposition Final User 03/27/2018 1:44:57 PM Clinical Call Yes Weiss-H[place], RN, Ria Comment

## 2018-03-29 NOTE — Telephone Encounter (Signed)
I reviewed Dr. Alla German last note and multiple pt emails. Chronic leg pain. Usually controlled with Tramadol 100 mg BID. Was given RX 7/3 # 90 (not quite a months supply). Will go ahead and refill in Dr. Everardo Beals abscense. She should follow up with him if has persistent pain or requiring increased doses.

## 2018-03-29 NOTE — Telephone Encounter (Signed)
Name of Medication: tramadol Name of [facility]acy: [facility] or Written Date and Quantity: # 90 on 03/10/18 Last Office Visit and Type: 03/05/18 for leg pain Next Office Visit and Type: 04/28/18 for 3 mth f/u Dr Silvio Pate out of office.

## 2018-03-31 ENCOUNTER — Ambulatory Visit: Payer: Self-pay | Admitting: Internal Medicine

## 2018-03-31 NOTE — Telephone Encounter (Signed)
Pt c/o 1 week history of left leg pain. Pt states it is moderate in intensity. She has been taking tramadol only for pain. She is in physical therapy but had to cancel her appointment today because of the pain. Pt thinks her pain could be from spinal stenosis. Pt has also had swelling to both ankles since December and weakness to left leg. Disposition recommends for pt to see PCP within 3 days. Pt only wants to see Dr Silvio Pate. Next available appointment  04/21/18 at 11:00 am. Called pt and left message that appointment that was for 04/09/18 was canceled.Reason for Disposition . [1] MODERATE pain (e.g., interferes with normal activities, limping) AND [2] present > 3 days  Answer Assessment - Initial Assessment Questions 1. ONSET: "When did the pain start?"      1 week ago 2. LOCATION: "Where is the pain located?"      Left leg 3. PAIN: "How bad is the pain?"    (Scale 1-10; or mild, moderate, severe)   -  MILD (1-3): doesn't interfere with normal activities    -  MODERATE (4-7): interferes with normal activities (e.g., work or school) or awakens from sleep, limping    -  SEVERE (8-10): excruciating pain, unable to do any normal activities, unable to walk     moderate 4. WORK OR EXERCISE: "Has there been any recent work or exercise that involved this part of the body?"      No just walking has been doing physical therapy 5. CAUSE: "What do you think is causing the leg pain?"     Spinal stenosis 6. OTHER SYMPTOMS: "Do you have any other symptoms?" (e.g., chest pain, back pain, breathing difficulty, swelling, rash, fever, numbness, weakness)     Swelling to both ankles has had months since December, weakness to left leg 7. PREGNANCY: "Is there any chance you are pregnant?" "When was your last menstrual period?"     n/a  Protocols used: LEG PAIN-A-AH

## 2018-04-01 ENCOUNTER — Encounter: Payer: Self-pay | Admitting: Internal Medicine

## 2018-04-01 ENCOUNTER — Ambulatory Visit: Payer: Self-pay

## 2018-04-01 NOTE — Telephone Encounter (Signed)
Patient called, left VM to return call to the office. 

## 2018-04-07 ENCOUNTER — Ambulatory Visit (INDEPENDENT_AMBULATORY_CARE_PROVIDER_SITE_OTHER): Payer: Medicare HMO | Admitting: Internal Medicine

## 2018-04-07 ENCOUNTER — Encounter: Payer: Self-pay | Admitting: Internal Medicine

## 2018-04-07 VITALS — BP 124/70 | HR 72 | Temp 98.4°F | Ht 62.0 in | Wt 183.0 lb

## 2018-04-07 DIAGNOSIS — M48062 Spinal stenosis, lumbar region with neurogenic claudication: Secondary | ICD-10-CM | POA: Diagnosis not present

## 2018-04-07 NOTE — Progress Notes (Signed)
Subjective:    Patient ID: [name], female    DOB: 1945-10-19, 72 y.o.   MRN: [id number]  HPI Here due to ongoing pain "I can't do anything" Grabs her at times--posterior thigh Trouble even putting weight on her left leg "I want an MRI"  This is not the same pain as before the THR on the left Still on tramadol, duloxetine, gabapentin, tylenol  Current Outpatient Medications on File Prior to Visit  Medication Sig Dispense Refill  . ACCU-CHEK AVIVA PLUS test strip USE TO TEST BLOOD SUGAR ONCE DAILY DX: R73.03 100 each 3  . ACCU-CHEK SOFTCLIX LANCETS lancets USE AS INSTRUCTED 100 each 3  . acetaminophen (TYLENOL) 500 MG tablet Take 500 mg by mouth every 4 (four) hours as needed.    . Amino Acids (AMINO ACID PO) Take 1 tablet by mouth 2 (two) times daily.    . Blood Glucose Monitoring Suppl (ACCU-CHEK AVIVA PLUS) w/Device KIT Use to test blood sugar once daily Dx: R73.03 1 kit 0  . Chromium Picolinate 1000 MCG TABS Take 1,000 mcg by mouth daily.     . Coenzyme Q10 (CO Q 10) 100 MG CAPS Take 100 mg by mouth daily.     . DULoxetine (CYMBALTA) 60 MG capsule Take 1 capsule (60 mg total) by mouth every evening. (Patient taking differently: Take 60 mg by mouth 2 (two) times daily. ) 90 capsule 0  . gabapentin (NEURONTIN) 300 MG capsule TAKE TWO CAPSULES BY MOUTH TWICE A DAY TAKE 3 CAPSULES AT BEDTIME (Patient taking differently: Take 600-900 mg by mouth See admin instructions. Take 600 mg by mouth twice daily and take 900 mg by mouth at bedtime) 210 capsule 11  . lisinopril-hydrochlorothiazide (PRINZIDE,ZESTORETIC) 20-25 MG tablet TAKE 2 TABLETS BY MOUTH DAILY 180 tablet 3  . metFORMIN (GLUCOPHAGE-XR) 500 MG 24 hr tablet TAKE 1 TABLET (500 MG TOTAL) BY MOUTH DAILY WITH BREAKFAST. 90 tablet 3  . Multiple Vitamins-Minerals (MULTIVITAMIN WITH MINERALS) tablet Take 1 tablet by mouth daily.     . Omega-3 Fatty Acids (FISH OIL) 1000 MG CAPS Take 1,000 mg by mouth 2 (two) times daily.     .  Probiotic Product ([employer]S SENIOR PROBIOTIC) CAPS Take 1 capsule by mouth 2 (two) times daily. (Patient taking differently: Take 1 capsule by mouth daily. ) 180 capsule 3  . Soya Lecithin 1200 MG CAPS Take 1,200 mg by mouth 3 (three) times daily.     . SUPER B COMPLEX/C PO Take 1 tablet by mouth daily.    . traMADol (ULTRAM) 50 MG tablet TAKE 1-2 TABLETS (50-100 MG TOTAL) BY MOUTH 3 (THREE) TIMES DAILY AS NEEDED. 90 tablet 0  . vitamin C (ASCORBIC ACID) 500 MG tablet Take 500 mg by mouth daily.     No current facility-administered medications on file prior to visit.     Allergies  Allergen Reactions  . Acyclovir And Related     Past Medical History:  Diagnosis Date  . Arthritis   . Confusion   . Diabetes mellitus without complication ([place])    type 2  . Hepatitis    pt. denies  . Hypertension   . Lumbar stenosis   . Memory loss   . Nocturia   . Numbness and tingling    legs and feet  . Wears glasses     Past Surgical History:  Procedure Laterality Date  . [place]  . COLONOSCOPY    . HEMORRHOID SURGERY  2015  .  JOINT REPLACEMENT     01-08-18 Dr. Kathrynn Speed  . TOTAL HIP ARTHROPLASTY Left 01/08/2018   Procedure: LEFT TOTAL HIP ARTHROPLASTY ANTERIOR APPROACH;  Surgeon: Mcarthur Rossetti, MD;  Location: [facility];  Service: Orthopedics;  Laterality: Left;  . TRANSFORAMINAL LUMBAR INTERBODY FUSION (TLIF) WITH PEDICLE SCREW FIXATION 2 LEVEL N/A 11/07/2015   Procedure: Lumbar three-four Lumbar four-five  transforaminal lumbar interbody fusion with interbody prosthesis posterior lateral arthrodesis and posterior segmental instrumentation;  Surgeon: Newman Pies, MD;  Location: [place] [facility];  Service: Neurosurgery;  Laterality: N/A;    Family History  Problem Relation Age of Onset  . Hypertension Other   . Cancer Other        ovarian  . Cancer Mother   . Heart disease Father     Social History   Socioeconomic History  . Marital status: Divorced    Spouse  name: Not on file  . Number of children: 4  . Years of education: Not on file  . Highest education level: Associate degree: occupational, Hotel manager, or vocational program  Occupational History  . Not on file  Social Needs  . Financial resource strain: Not very hard  . Food insecurity:    Worry: Never true    Inability: Never true  . Transportation needs:    Medical: Yes    Non-medical: Yes  Tobacco Use  . Smoking status: Never Smoker  . Smokeless tobacco: Never Used  Substance and Sexual Activity  . Alcohol use: No    Comment: rarely  . Drug use: No  . Sexual activity: Not Currently  Lifestyle  . Physical activity:    Days per week: 0 days    Minutes per session: 0 min  . Stress: To some extent  Relationships  . Social connections:    Talks on phone: Not on file    Gets together: Not on file    Attends religious service: Never    Active member of club or organization: No    Attends meetings of clubs or organizations: Never    Relationship status: Divorced  . Intimate partner violence:    Fear of current or ex partner: No    Emotionally abused: No    Physically abused: No    Forced sexual activity: No  Other Topics Concern  . Not on file  Social History Narrative   Has a living will.   Son is health care POA   Would accept resuscitation.  Would not want prolonged life support if futile.   Probably would not want tube feeds if cognitively unaware   Review of Systems  Sleeps if she takes medication Appetite remains fine     Objective:   Physical Exam  Musculoskeletal:  Tenderness over lumbar spine---causes sensation change in right hip Severe left calf pain at times ROM left hip seems okay  Neurological:  Able to walk--full weight bearing Left leg is weaker--but seems to be from pain (when sitting without pain, seemed to be same as right leg)  Psychiatric:  Angry and tearful here Demanding that I do tests           Assessment & Plan:

## 2018-04-07 NOTE — Assessment & Plan Note (Signed)
Now seems to be having worse radicular pain in left calf Last CT in 2/18 showed patent neural canal after her surgery Seemed some better after left hip replacement --but now with worsened radicular symptoms Was seeking opinion about rhizotomy--but hasn't heard about this Will try to get her back with Dr Arnoldo Morale ASAP

## 2018-04-08 NOTE — Addendum Note (Signed)
Addended by: Viviana Simpler I on: 04/08/2018 01:14 PM   Modules accepted: Orders

## 2018-04-09 ENCOUNTER — Ambulatory Visit: Payer: Self-pay | Admitting: Internal Medicine

## 2018-04-11 ENCOUNTER — Ambulatory Visit
Admission: RE | Admit: 2018-04-11 | Discharge: 2018-04-11 | Disposition: A | Discharge status: Home / Self Care | Payer: Medicare HMO | Source: Ambulatory Visit | Attending: Internal Medicine | Admitting: Internal Medicine

## 2018-04-11 DIAGNOSIS — M48062 Spinal stenosis, lumbar region with neurogenic claudication: Secondary | ICD-10-CM

## 2018-04-11 DIAGNOSIS — M545 Low back pain: Secondary | ICD-10-CM | POA: Diagnosis not present

## 2018-04-12 ENCOUNTER — Encounter: Payer: Self-pay | Admitting: Internal Medicine

## 2018-04-13 MED ORDER — TRAMADOL HCL 50 MG PO TABS: 50.0000 mg | ORAL_TABLET | Freq: Three times a day (TID) | ORAL | 0 refills | Status: DC | PRN

## 2018-04-15 ENCOUNTER — Other Ambulatory Visit: Payer: Self-pay | Admitting: Internal Medicine

## 2018-04-15 MED ORDER — TIZANIDINE HCL 4 MG PO CAPS: 4.0000 mg | ORAL_CAPSULE | Freq: Three times a day (TID) | ORAL | 1 refills | Status: DC | PRN

## 2018-04-15 MED ORDER — TIZANIDINE HCL 4 MG PO TABS: 4.0000 mg | ORAL_TABLET | Freq: Three times a day (TID) | ORAL | 1 refills | Status: DC | PRN

## 2018-04-15 NOTE — Addendum Note (Signed)
Addended by: Pilar Grammes on: 04/15/2018 04:26 PM   Modules accepted: Orders

## 2018-04-16 ENCOUNTER — Telehealth: Payer: Self-pay

## 2018-04-16 NOTE — Telephone Encounter (Signed)
Copied from [place] [id number]. Topic: Referral - Question >> Apr 16, 2018  4:24 PM Bea Graff, NT wrote: Reason for CRM: Pt states that Dr. Arnoldo Morale cannot get her in until October and she states she does not want to wait that long and would like to see what can be done.

## 2018-04-19 ENCOUNTER — Telehealth (INDEPENDENT_AMBULATORY_CARE_PROVIDER_SITE_OTHER): Payer: Self-pay | Admitting: Orthopaedic Surgery

## 2018-04-19 NOTE — Telephone Encounter (Signed)
Hopefully she will get in sooner

## 2018-04-19 NOTE — Telephone Encounter (Signed)
Spoke to Network engineer at Dr Arnoldo Morale office and she said after review of the MRI that is where he told her to schedule the patient, in next available appointment spot.

## 2018-04-19 NOTE — Telephone Encounter (Signed)
Returned call to patient left message to return call °

## 2018-04-21 ENCOUNTER — Ambulatory Visit (INDEPENDENT_AMBULATORY_CARE_PROVIDER_SITE_OTHER): Payer: Medicare HMO

## 2018-04-21 ENCOUNTER — Encounter (INDEPENDENT_AMBULATORY_CARE_PROVIDER_SITE_OTHER): Payer: Self-pay | Admitting: Orthopaedic Surgery

## 2018-04-21 ENCOUNTER — Ambulatory Visit: Payer: Self-pay | Admitting: Internal Medicine

## 2018-04-21 ENCOUNTER — Ambulatory Visit (INDEPENDENT_AMBULATORY_CARE_PROVIDER_SITE_OTHER): Payer: Medicare HMO | Admitting: Orthopaedic Surgery

## 2018-04-21 DIAGNOSIS — Z96642 Presence of left artificial hip joint: Secondary | ICD-10-CM

## 2018-04-21 DIAGNOSIS — M25552 Pain in left hip: Secondary | ICD-10-CM

## 2018-04-21 NOTE — Progress Notes (Signed)
The patient is getting close to 4 months status post a left total hip arthroplasty to treat debilitating arthritis of her left hip.  She is been having issues with leg pain and she is had multiple back surgeries in the past.  She describes pain that is radiating down her backside to behind her knee and then down the rest of her leg.  She denies any groin pain.  On exam she looks me easily put her left hip to internal extra rotation and she said there is no pain with that motion at all and the used to be pain.  She has no significant pain on exam for me today especially with no significant pain over the trochanteric area or the iliotibial band.  Most of her pain seems to be in the sciatic region and going down her leg.  X-rays obtained of her pelvis and lateral left hip and shows a well-seated implant with no complicating features.  At this point from a hip standpoint I think she should just give this time.  She does have a follow-up I believe that the neurosurgeon as it relates to her recent MRI of her lumbar spine.  From my standpoint I do not need to see her back for 3 months.  At that visit no x-rays are needed.

## 2018-04-27 ENCOUNTER — Other Ambulatory Visit: Payer: Self-pay | Admitting: Internal Medicine

## 2018-04-27 NOTE — Telephone Encounter (Signed)
Last filled 04-14-18 #90 Last OV 04-07-18 Next OV 04-28-18

## 2018-04-28 ENCOUNTER — Encounter: Payer: Self-pay | Admitting: Internal Medicine

## 2018-04-28 ENCOUNTER — Ambulatory Visit (INDEPENDENT_AMBULATORY_CARE_PROVIDER_SITE_OTHER): Payer: Medicare HMO | Admitting: Internal Medicine

## 2018-04-28 VITALS — BP 122/70 | HR 74 | Temp 98.1°F | Ht 62.0 in | Wt 184.0 lb

## 2018-04-28 DIAGNOSIS — E1140 Type 2 diabetes mellitus with diabetic neuropathy, unspecified: Secondary | ICD-10-CM

## 2018-04-28 DIAGNOSIS — F320 Major depressive disorder, single episode, mild: Secondary | ICD-10-CM

## 2018-04-28 DIAGNOSIS — F39 Unspecified mood [affective] disorder: Secondary | ICD-10-CM

## 2018-04-28 DIAGNOSIS — M48062 Spinal stenosis, lumbar region with neurogenic claudication: Secondary | ICD-10-CM

## 2018-04-28 MED ORDER — CVS SENIOR PROBIOTIC PO CAPS: 1.0000 | ORAL_CAPSULE | Freq: Every day | ORAL | 0 refills | Status: DC

## 2018-04-28 MED ORDER — DULOXETINE HCL 60 MG PO CPEP: 60.0000 mg | ORAL_CAPSULE | Freq: Two times a day (BID) | ORAL | 0 refills | Status: DC

## 2018-04-28 NOTE — Assessment & Plan Note (Signed)
Not as angry and depressed now--- as her pain is better

## 2018-04-28 NOTE — Assessment & Plan Note (Signed)
Still has very good control Due for A1c next time

## 2018-04-28 NOTE — Assessment & Plan Note (Signed)
Pain control is better now Will be seeking other options now--- Dr Cari Caraway is next

## 2018-04-28 NOTE — Progress Notes (Signed)
Subjective:    Patient ID: [name], female    DOB: [birth year], 72 y.o.   MRN: [id number]  HPI Here for follow up of pain Did see Dr Amedeo Kinsman is fine Some better with the muscle relaxer and the tramadol Still on the gabapentin and duloxetine also  Tramadol making her tired? Will try 1m but just more frequently  Will see Dr YCari Carawaynext week-- to consider rhizotomy? Dr JArnoldo Moraleis further out  Checking sugars regularly 97-115 No hypoglycemic spells  Current Outpatient Medications on File Prior to Visit  Medication Sig Dispense Refill  . ACCU-CHEK AVIVA PLUS test strip USE TO TEST BLOOD SUGAR ONCE DAILY DX: R73.03 100 each 3  . ACCU-CHEK SOFTCLIX LANCETS lancets USE AS INSTRUCTED 100 each 3  . acetaminophen (TYLENOL) 500 MG tablet Take 500 mg by mouth every 4 (four) hours as needed.    . Amino Acids (AMINO ACID PO) Take 1 tablet by mouth 2 (two) times daily.    . Blood Glucose Monitoring Suppl (ACCU-CHEK AVIVA PLUS) w/Device KIT Use to test blood sugar once daily Dx: R73.03 1 kit 0  . Chromium Picolinate 1000 MCG TABS Take 1,000 mcg by mouth daily.     . Coenzyme Q10 (CO Q 10) 100 MG CAPS Take 100 mg by mouth daily.     .Marland Kitchendocusate sodium (COLACE) 100 MG capsule Take 100 mg by mouth daily.    .Marland Kitchengabapentin (NEURONTIN) 300 MG capsule Take 600 mg by mouth 2 (two) times daily. And 9068mat bedtime    . lisinopril-hydrochlorothiazide (PRINZIDE,ZESTORETIC) 20-25 MG tablet TAKE 2 TABLETS BY MOUTH DAILY 180 tablet 3  . meloxicam (MOBIC) 7.5 MG tablet Take 7.5 mg by mouth daily.  3  . metFORMIN (GLUCOPHAGE-XR) 500 MG 24 hr tablet TAKE 1 TABLET (500 MG TOTAL) BY MOUTH DAILY WITH BREAKFAST. 90 tablet 3  . Multiple Vitamins-Minerals (MULTIVITAMIN WITH MINERALS) tablet Take 1 tablet by mouth daily.     . Omega-3 Fatty Acids (FISH OIL) 1000 MG CAPS Take 1,000 mg by mouth 2 (two) times daily.     . Edythe Lynnecithin 1200 MG CAPS Take 1,200 mg by mouth 3 (three) times daily.     .  SUPER B COMPLEX/C PO Take 1 tablet by mouth daily.    . Marland KitcheniZANidine (ZANAFLEX) 4 MG tablet Take 1 tablet (4 mg total) by mouth every 8 (eight) hours as needed for muscle spasms. 60 tablet 1  . traMADol (ULTRAM) 50 MG tablet TAKE 1-2 TABLETS (50-100 MG TOTAL) BY MOUTH 3 (THREE) TIMES DAILY AS NEEDED. 90 tablet 0  . vitamin C (ASCORBIC ACID) 500 MG tablet Take 500 mg by mouth daily.     No current facility-administered medications on file prior to visit.     Allergies  Allergen Reactions  . Acyclovir And Related     Past Medical History:  Diagnosis Date  . Arthritis   . Confusion   . Diabetes mellitus without complication ([facility]   type 2  . Hepatitis    pt. denies  . Hypertension   . Lumbar stenosis   . Memory loss   . Nocturia   . Numbness and tingling    legs and feet  . Wears glasses     Past Surgical History:  Procedure Laterality Date  . [place]. COLONOSCOPY    . HEMORRHOID SURGERY  2015  . JOINT REPLACEMENT     01-08-18 Dr. C.Kathrynn Speed. TOTAL HIP  ARTHROPLASTY Left 01/08/2018   Procedure: LEFT TOTAL HIP ARTHROPLASTY ANTERIOR APPROACH;  Surgeon: Mcarthur Rossetti, MD;  Location: [facility];  Service: Orthopedics;  Laterality: Left;  . TRANSFORAMINAL LUMBAR INTERBODY FUSION (TLIF) WITH PEDICLE SCREW FIXATION 2 LEVEL N/A 11/07/2015   Procedure: Lumbar three-four Lumbar four-five  transforaminal lumbar interbody fusion with interbody prosthesis posterior lateral arthrodesis and posterior segmental instrumentation;  Surgeon: Newman Pies, MD;  Location: [place] [facility];  Service: Neurosurgery;  Laterality: N/A;    Family History  Problem Relation Age of Onset  . Hypertension Other   . Cancer Other        ovarian  . Cancer Mother   . Heart disease Father     Social History   Socioeconomic History  . Marital status: Divorced    Spouse name: Not on file  . Number of children: 4  . Years of education: Not on file  . Highest education level: Associate  degree: occupational, Hotel manager, or vocational program  Occupational History  . Not on file  Social Needs  . Financial resource strain: Not very hard  . Food insecurity:    Worry: Never true    Inability: Never true  . Transportation needs:    Medical: Yes    Non-medical: Yes  Tobacco Use  . Smoking status: Never Smoker  . Smokeless tobacco: Never Used  Substance and Sexual Activity  . Alcohol use: No    Comment: rarely  . Drug use: No  . Sexual activity: Not Currently  Lifestyle  . Physical activity:    Days per week: 0 days    Minutes per session: 0 min  . Stress: To some extent  Relationships  . Social connections:    Talks on phone: Not on file    Gets together: Not on file    Attends religious service: Never    Active member of club or organization: No    Attends meetings of clubs or organizations: Never    Relationship status: Divorced  . Intimate partner violence:    Fear of current or ex partner: No    Emotionally abused: No    Physically abused: No    Forced sexual activity: No  Other Topics Concern  . Not on file  Social History Narrative   Has a living will.   Son is health care POA   Would accept resuscitation.  Would not want prolonged life support if futile.   Probably would not want tube feeds if cognitively unaware   Review of Systems Sleeping fine Appetite is still fine Weight stable    Objective:   Physical Exam  Constitutional: She appears well-developed. No distress.  Psychiatric:  Calm now Clearly more comfortable now with reduced pain, etc           Assessment & Plan:

## 2018-05-13 ENCOUNTER — Other Ambulatory Visit: Payer: Self-pay | Admitting: Internal Medicine

## 2018-05-13 DIAGNOSIS — M5416 Radiculopathy, lumbar region: Secondary | ICD-10-CM | POA: Diagnosis not present

## 2018-05-13 NOTE — Telephone Encounter (Signed)
Pt called back checking the status of this medication stating that she only has one tablet left. Pt states that she needs this immediately.

## 2018-05-13 NOTE — Telephone Encounter (Signed)
Last filled 04-27-18 #90 Last OV 04-28-18 Next OV 07-23-18

## 2018-05-13 NOTE — Telephone Encounter (Signed)
Let her know, I sent the Rx and gave an increased amount

## 2018-05-14 ENCOUNTER — Telehealth: Payer: Self-pay

## 2018-05-14 NOTE — Telephone Encounter (Signed)
I sent her a MyChart Message/

## 2018-05-14 NOTE — Telephone Encounter (Signed)
Spoke to [place] at the pharmacy. Advised her the pt doesn't need a pneumonia vaccine, just flu.

## 2018-05-14 NOTE — Telephone Encounter (Signed)
Copied from [place] [id number]. Topic: Quick Communication - See Telephone Encounter >> May 14, 2018 10:46 AM Antonieta Iba C wrote: CRM for notification. See Telephone encounter for: 05/14/18.  Marjory Lies w/ pharmacy called in on pt's behalf. Pt would like to know if it is okay for her to received her flu and pneumonia vac at the pharmacy?    She would like for the PCP to advise.  [phone]

## 2018-05-18 ENCOUNTER — Other Ambulatory Visit: Payer: Self-pay | Admitting: Internal Medicine

## 2018-05-18 NOTE — Telephone Encounter (Signed)
Last filled 05-01-18 #60 Last OV 04-28-18 Next OV 07-23-18

## 2018-05-18 NOTE — Telephone Encounter (Signed)
Approved: #90 x 0 

## 2018-05-24 DIAGNOSIS — M79605 Pain in left leg: Secondary | ICD-10-CM | POA: Diagnosis not present

## 2018-05-28 ENCOUNTER — Encounter: Payer: Self-pay | Admitting: Internal Medicine

## 2018-05-28 NOTE — Telephone Encounter (Unsigned)
Copied from [place] [id number]. Topic: Referral - Request >> May 28, 2018 10:09 AM Rutherford Nail, NT wrote: Reason for CRM: Patient calling and is requesting a referral to Dr Manuella Ghazi at the [facility] for the scrambler technique. Please advise.

## 2018-05-28 NOTE — Telephone Encounter (Signed)
Pt has sent multiple MyChart messages and called asking if someone would do a referral for this procedure because Dr Joretta Bachelor is not answering her. I sent her a message stating I was not sure, but I did not think another provider would do it. Can you let her know either way? Thanks

## 2018-06-01 ENCOUNTER — Other Ambulatory Visit: Payer: Self-pay | Admitting: Internal Medicine

## 2018-06-01 ENCOUNTER — Other Ambulatory Visit (INDEPENDENT_AMBULATORY_CARE_PROVIDER_SITE_OTHER): Payer: Self-pay | Admitting: Orthopaedic Surgery

## 2018-06-01 NOTE — Telephone Encounter (Signed)
Ok to refill 

## 2018-06-01 NOTE — Telephone Encounter (Signed)
Last filled 05-13-18 #120 Last OV 04-28-18 Next OV 07-23-18

## 2018-06-08 NOTE — Telephone Encounter (Signed)
I spoke to the pt to clarify what to do with rx because this note did not mention faxing it. She said she would come pick it up. I have placed it up front in the blue folders.

## 2018-06-08 NOTE — Telephone Encounter (Signed)
Rx written Please fax to her

## 2018-06-12 ENCOUNTER — Other Ambulatory Visit: Payer: Self-pay | Admitting: Internal Medicine

## 2018-06-15 ENCOUNTER — Telehealth: Payer: Self-pay

## 2018-06-15 NOTE — Telephone Encounter (Signed)
I wanted to send an FYI to let you know we received notification from Dr Arnoldo Morale office letting us know that patient cancelled her appointment with Dr Arnoldo Morale for 06/29/18 and did not reschedule.-Karthika Glasper V Etana Beets, RMA

## 2018-06-16 NOTE — Telephone Encounter (Signed)
That is very surprising Hopefully it is because she is doing better

## 2018-06-21 ENCOUNTER — Other Ambulatory Visit: Payer: Self-pay | Admitting: Internal Medicine

## 2018-06-22 ENCOUNTER — Other Ambulatory Visit: Payer: Self-pay | Admitting: Internal Medicine

## 2018-06-22 NOTE — Telephone Encounter (Signed)
Last filled 06-01-18 #120 Last OV 04-28-18 Next OV 07-23-18  Forward to [facility] in Dr Alla German absence

## 2018-06-30 ENCOUNTER — Ambulatory Visit (INDEPENDENT_AMBULATORY_CARE_PROVIDER_SITE_OTHER): Payer: Medicare HMO | Admitting: Internal Medicine

## 2018-06-30 ENCOUNTER — Encounter: Payer: Self-pay | Admitting: Internal Medicine

## 2018-06-30 VITALS — BP 122/80 | HR 75 | Temp 97.5°F | Ht 62.0 in | Wt 184.0 lb

## 2018-06-30 DIAGNOSIS — E1140 Type 2 diabetes mellitus with diabetic neuropathy, unspecified: Secondary | ICD-10-CM | POA: Diagnosis not present

## 2018-06-30 DIAGNOSIS — M48062 Spinal stenosis, lumbar region with neurogenic claudication: Secondary | ICD-10-CM | POA: Diagnosis not present

## 2018-06-30 LAB — POCT GLYCOSYLATED HEMOGLOBIN (HGB A1C): Hemoglobin A1C: 5.8 % — AB (ref 4.0–5.6)

## 2018-06-30 NOTE — Assessment & Plan Note (Signed)
Better with the TENS unit Hopefully will cut back on the tramadol

## 2018-06-30 NOTE — Progress Notes (Signed)
 Subjective:    Patient ID: [name], female    DOB: 01/29/1946, 72 y.o.   MRN: [id number]  HPI Here for follow up of diabetes and chronic pain  The TENS unit has made a big difference It really controls her pain when she has it on Trying to cut back on the tramadol  Mood is better due to relief of the pain  Checks sugars three times a week 105-110 No hypoglycemic reactions Occasional foot pain. Some numbness  Current Outpatient Medications on File Prior to Visit  Medication Sig Dispense Refill  . acetaminophen (TYLENOL) 500 MG tablet Take 500 mg by mouth every 4 (four) hours as needed.    . Amino Acids (AMINO ACID PO) Take 1 tablet by mouth 2 (two) times daily.    . Blood Glucose Monitoring Suppl (ACCU-CHEK AVIVA PLUS) w/Device KIT Use to test blood sugar once daily Dx: R73.03 1 kit 0  . Chromium Picolinate 1000 MCG TABS Take 1,000 mcg by mouth daily.     . Coenzyme Q10 (CO Q 10) 100 MG CAPS Take 100 mg by mouth daily.     . docusate sodium (COLACE) 100 MG capsule Take 100 mg by mouth daily.    . DULoxetine (CYMBALTA) 60 MG capsule Take 1 capsule (60 mg total) by mouth 2 (two) times daily. 1 capsule 0  . gabapentin (NEURONTIN) 300 MG capsule Take 600 mg by mouth 2 (two) times daily. And 900mg at bedtime    . lisinopril-hydrochlorothiazide (PRINZIDE,ZESTORETIC) 20-25 MG tablet TAKE 2 TABLETS BY MOUTH DAILY 180 tablet 3  . meloxicam (MOBIC) 7.5 MG tablet Take 7.5 mg by mouth daily.  3  . metFORMIN (GLUCOPHAGE-XR) 500 MG 24 hr tablet TAKE 1 TABLET (500 MG TOTAL) BY MOUTH DAILY WITH BREAKFAST. 90 tablet 3  . Multiple Vitamins-Minerals (MULTIVITAMIN WITH MINERALS) tablet Take 1 tablet by mouth daily.     . Omega-3 Fatty Acids (FISH OIL) 1000 MG CAPS Take 1,000 mg by mouth 2 (two) times daily.     . Probiotic Product ([employer] SENIOR PROBIOTIC) CAPS Take 1 capsule by mouth daily. 1 capsule 0  . Soya Lecithin 1200 MG CAPS Take 1,200 mg by mouth 3 (three) times daily.     . tiZANidine  (ZANAFLEX) 4 MG tablet TAKE 1 TABLET (4 MG TOTAL) BY MOUTH EVERY 8 (EIGHT) HOURS AS NEEDED FOR MUSCLE SPASMS. 270 tablet 0  . traMADol (ULTRAM) 50 MG tablet TAKE 1-2 TABLETS (50-100 MG TOTAL) BY MOUTH 3 (THREE) TIMES DAILY AS NEEDED. 120 tablet 0  . vitamin C (ASCORBIC ACID) 500 MG tablet Take 500 mg by mouth daily.    . ACCU-CHEK AVIVA PLUS test strip USE TO TEST BLOOD SUGAR ONCE DAILY DX: R73.03 100 each 3  . ACCU-CHEK SOFTCLIX LANCETS lancets USE AS INSTRUCTED 100 each 3  . SUPER B COMPLEX/C PO Take 1 tablet by mouth daily.     No current facility-administered medications on file prior to visit.     Allergies  Allergen Reactions  . Acyclovir And Related     Past Medical History:  Diagnosis Date  . Arthritis   . Confusion   . Diabetes mellitus without complication (HCC)    type 2  . Hepatitis    pt. denies  . Hypertension   . Lumbar stenosis   . Memory loss   . Nocturia   . Numbness and tingling    legs and feet  . Wears glasses     Past Surgical History:    Procedure Laterality Date  . [place]  . COLONOSCOPY    . HEMORRHOID SURGERY  2015  . JOINT REPLACEMENT     01-08-18 Dr. Kathrynn Speed  . TOTAL HIP ARTHROPLASTY Left 01/08/2018   Procedure: LEFT TOTAL HIP ARTHROPLASTY ANTERIOR APPROACH;  Surgeon: Mcarthur Rossetti, MD;  Location: [facility];  Service: Orthopedics;  Laterality: Left;  . TRANSFORAMINAL LUMBAR INTERBODY FUSION (TLIF) WITH PEDICLE SCREW FIXATION 2 LEVEL N/A 11/07/2015   Procedure: Lumbar three-four Lumbar four-five  transforaminal lumbar interbody fusion with interbody prosthesis posterior lateral arthrodesis and posterior segmental instrumentation;  Surgeon: Newman Pies, MD;  Location: [place] [facility];  Service: Neurosurgery;  Laterality: N/A;    Family History  Problem Relation Age of Onset  . Hypertension Other   . Cancer Other        ovarian  . Cancer Mother   . Heart disease Father     Social History   Socioeconomic History  .  Marital status: Divorced    Spouse name: Not on file  . Number of children: 4  . Years of education: Not on file  . Highest education level: Associate degree: occupational, Hotel manager, or vocational program  Occupational History  . Not on file  Social Needs  . Financial resource strain: Not very hard  . Food insecurity:    Worry: Never true    Inability: Never true  . Transportation needs:    Medical: Yes    Non-medical: Yes  Tobacco Use  . Smoking status: Never Smoker  . Smokeless tobacco: Never Used  Substance and Sexual Activity  . Alcohol use: No    Comment: rarely  . Drug use: No  . Sexual activity: Not Currently  Lifestyle  . Physical activity:    Days per week: 0 days    Minutes per session: 0 min  . Stress: To some extent  Relationships  . Social connections:    Talks on phone: Not on file    Gets together: Not on file    Attends religious service: Never    Active member of club or organization: No    Attends meetings of clubs or organizations: Never    Relationship status: Divorced  . Intimate partner violence:    Fear of current or ex partner: No    Emotionally abused: No    Physically abused: No    Forced sexual activity: No  Other Topics Concern  . Not on file  Social History Narrative   Has a living will.   Son is health care POA   Would accept resuscitation.  Would not want prolonged life support if futile.   Probably would not want tube feeds if cognitively unaware   Review of Systems Occasional dizziness Sleeping fairly well Nocturia is frequency Appetite is good Has gained back some weight    Objective:   Physical Exam  Constitutional: She appears well-developed. No distress.  Cardiovascular: Normal rate, regular rhythm, normal heart sounds and intact distal pulses.  No murmur heard. Respiratory: Effort normal and breath sounds normal. No respiratory distress. She has no wheezes. She has no rales.  Musculoskeletal: She exhibits no edema.    Skin:  No foot lesions Dilated veins in feet and mild stasis changes           Assessment & Plan:

## 2018-06-30 NOTE — Assessment & Plan Note (Signed)
Lab Results  Component Value Date   HGBA1C 5.8 (A) 06/30/2018   Congratulations on great control Mild neuropathy--multiple meds for back problems also

## 2018-07-05 ENCOUNTER — Ambulatory Visit: Payer: Self-pay | Admitting: Internal Medicine

## 2018-07-20 NOTE — Progress Notes (Signed)
Cardiology Office Note  Date:  07/21/2018   ID:  Alexis, Velez 07/26/1946, MRN [id number]  PCP:  Venia Carbon, MD   Chief Complaint  Patient presents with  . OTHER    12 month f/u c/o ankle pain. Meds reviewed verbally with pt.    HPI:  [name] is a 72 year old woman with history of  chronic back pain, back surgery in 2017 ,  glucose intolerance/diabetes type 2,  hypertension,  previously seen for abnormal EKG and preop clearance,  who presents for routine follow-up of her abnormal EKG, hypertension  In follow-up today she reports that her legs are weak Sedentary, no regular exercise Lives alone, not much help from her children Relying on a neighbor to help her do chores around the house  Denies any chest pain, shortness of breath Weight continues to run high On last clinic visit she had dropped 30 pounds in weight but this has since stabilized Back pain, SI joint pain Pain with walking  Lab work reviewed with her HBA1C 5.8  EKG personally reviewed by myself on todays visit Shows normal sinus rhythm/sinus tachycardia rate 106 bpm right bundle branch block, left anterior fascicular block   other past medical history reviewed  in 2017 reported fluttering  in her chest lasting for 10 seconds or so, happens several times per year otherwise feels well most of the time.  Denies any smoking history   PMH:   has a past medical history of Arthritis, Confusion, Diabetes mellitus without complication ([place]), Hepatitis, Hypertension, Lumbar stenosis, Memory loss, Nocturia, Numbness and tingling, and Wears glasses.  PSH:    Past Surgical History:  Procedure Laterality Date  . [place]  . COLONOSCOPY    . HEMORRHOID SURGERY  2015  . JOINT REPLACEMENT     01-08-18 Dr. Kathrynn Speed  . TOTAL HIP ARTHROPLASTY Left 01/08/2018   Procedure: LEFT TOTAL HIP ARTHROPLASTY ANTERIOR APPROACH;  Surgeon: Mcarthur Rossetti, MD;  Location: [facility];  Service:  Orthopedics;  Laterality: Left;  . TRANSFORAMINAL LUMBAR INTERBODY FUSION (TLIF) WITH PEDICLE SCREW FIXATION 2 LEVEL N/A 11/07/2015   Procedure: Lumbar three-four Lumbar four-five  transforaminal lumbar interbody fusion with interbody prosthesis posterior lateral arthrodesis and posterior segmental instrumentation;  Surgeon: Newman Pies, MD;  Location: [place] [facility];  Service: Neurosurgery;  Laterality: N/A;    Current Outpatient Medications  Medication Sig Dispense Refill  . ACCU-CHEK AVIVA PLUS test strip USE TO TEST BLOOD SUGAR ONCE DAILY DX: R73.03 100 each 3  . ACCU-CHEK SOFTCLIX LANCETS lancets USE AS INSTRUCTED 100 each 3  . acetaminophen (TYLENOL) 500 MG tablet Take 500 mg by mouth every 4 (four) hours as needed.    . Amino Acids (AMINO ACID PO) Take 1 tablet by mouth 2 (two) times daily.    . Blood Glucose Monitoring Suppl (ACCU-CHEK AVIVA PLUS) w/Device KIT Use to test blood sugar once daily Dx: R73.03 1 kit 0  . Chromium Picolinate 1000 MCG TABS Take 1,000 mcg by mouth daily.     . Coenzyme Q10 (CO Q 10) 100 MG CAPS Take 100 mg by mouth daily.     Marland Kitchen docusate sodium (COLACE) 100 MG capsule Take 100 mg by mouth daily as needed.     . DULoxetine (CYMBALTA) 60 MG capsule Take 1 capsule (60 mg total) by mouth 2 (two) times daily. 1 capsule 0  . gabapentin (NEURONTIN) 300 MG capsule Take 600 mg by mouth 2 (two) times daily. And 933m  at bedtime    . lisinopril-hydrochlorothiazide (PRINZIDE,ZESTORETIC) 20-25 MG tablet TAKE 2 TABLETS BY MOUTH DAILY 180 tablet 3  . meloxicam (MOBIC) 7.5 MG tablet Take 7.5 mg by mouth daily.  3  . metFORMIN (GLUCOPHAGE-XR) 500 MG 24 hr tablet TAKE 1 TABLET (500 MG TOTAL) BY MOUTH DAILY WITH BREAKFAST. 90 tablet 3  . Multiple Vitamins-Minerals (MULTIVITAMIN WITH MINERALS) tablet Take 1 tablet by mouth daily.     . Omega-3 Fatty Acids (FISH OIL) 1000 MG CAPS Take 1,000 mg by mouth 2 (two) times daily.     . Probiotic Product ([employer]S SENIOR PROBIOTIC) CAPS Take 1  capsule by mouth daily. 1 capsule 0  . Soya Lecithin 1200 MG CAPS Take 1,200 mg by mouth 3 (three) times daily.     . SUPER B COMPLEX/C PO Take 1 tablet by mouth daily.    Marland Kitchen tiZANidine (ZANAFLEX) 4 MG tablet TAKE 1 TABLET (4 MG TOTAL) BY MOUTH EVERY 8 (EIGHT) HOURS AS NEEDED FOR MUSCLE SPASMS. 270 tablet 0  . traMADol (ULTRAM) 50 MG tablet TAKE 1-2 TABLETS (50-100 MG TOTAL) BY MOUTH 3 (THREE) TIMES DAILY AS NEEDED. 120 tablet 0  . vitamin C (ASCORBIC ACID) 500 MG tablet Take 500 mg by mouth daily.     No current facility-administered medications for this visit.      Allergies:   Acyclovir and related   Social History:  The patient  reports that she has never smoked. She has never used smokeless tobacco. She reports that she does not drink alcohol or use drugs.   Family History:   family history includes Cancer in her mother and other; Heart disease in her father; Hypertension in her other.    Review of Systems: Review of Systems  Constitutional: Negative.   Respiratory: Negative.   Cardiovascular: Negative.   Gastrointestinal: Negative.   Musculoskeletal: Positive for back pain.  Neurological: Negative.   Psychiatric/Behavioral: Negative.   All other systems reviewed and are negative.    PHYSICAL EXAM: VS:  BP (!) 148/98 (BP Location: Left Arm, Patient Position: Sitting, Cuff Size: Normal)   Pulse (!) 106   Ht '5\' 2"'  (1.575 m)   Wt 180 lb (81.6 kg)   BMI 32.92 kg/m  , BMI Body mass index is 32.92 kg/m. Constitutional:  oriented to person, place, and time. No distress.  HENT:  Head: Grossly normal Eyes:  no discharge. No scleral icterus.  Neck: No JVD, no carotid bruits  Cardiovascular: Regular rate and rhythm, no murmurs appreciated Pulmonary/Chest: Clear to auscultation bilaterally, no wheezes or rails Abdominal: Soft.  no distension.  no tenderness.  Musculoskeletal: Normal range of motion Neurological:  normal muscle tone. Coordination normal. No atrophy Skin: Skin  warm and dry Psychiatric: normal affect, pleasant  Recent Labs: 08/04/2017: ALT 18 01/09/2018: BUN 20; Creatinine, Ser 0.87; Potassium 3.9; Sodium 135 01/11/2018: Hemoglobin 8.5; Platelets 196    Lipid Panel Lab Results  Component Value Date   CHOL 222 (H) 08/04/2017   HDL 72.20 08/04/2017   LDLCALC 115 (H) 08/04/2017   TRIG 174.0 (H) 08/04/2017      Wt Readings from Last 3 Encounters:  07/21/18 180 lb (81.6 kg)  06/30/18 184 lb (83.5 kg)  04/28/18 184 lb (83.5 kg)       ASSESSMENT AND PLAN:  Essential hypertension, benign - Plan: EKG 12-Lead Blood pressure elevated on today's visit, she attributes this to walking rapidly into the office.  Recommended she closely monitor blood pressure at home and call our  office with numbers  Type 2 diabetes, controlled, with neuropathy ([place]) - Plan: EKG 12-Lead We have encouraged continued careful diet management in an effort to lose weight. Unable to exercise secondary to chronic pain  Chronic bilateral low back pain without sciatica Chronic issue, limiting her ADLs around the house  Abnormal EKG Right bundle branch block, left anterior fascicular block No significant change in EKG in the past year No further work-up at this time   Total encounter time more than 25 minutes  Greater than 50% was spent in counseling and coordination of care with the patient  Disposition:   F/U as needed She will call us with blood pressure measurements  Orders Placed This Encounter  Procedures  . EKG 12-Lead     Signed, Esmond Plants, M.D., Ph.D. 07/21/2018  [place], [place]

## 2018-07-21 ENCOUNTER — Encounter: Payer: Self-pay | Admitting: Cardiovascular Disease

## 2018-07-21 ENCOUNTER — Ambulatory Visit (INDEPENDENT_AMBULATORY_CARE_PROVIDER_SITE_OTHER): Payer: Medicare HMO | Admitting: Cardiovascular Disease

## 2018-07-21 VITALS — BP 148/98 | HR 106 | Ht 62.0 in | Wt 180.0 lb

## 2018-07-21 DIAGNOSIS — R9431 Abnormal electrocardiogram [ECG] [EKG]: Secondary | ICD-10-CM | POA: Diagnosis not present

## 2018-07-21 DIAGNOSIS — E1140 Type 2 diabetes mellitus with diabetic neuropathy, unspecified: Secondary | ICD-10-CM

## 2018-07-21 DIAGNOSIS — I10 Essential (primary) hypertension: Secondary | ICD-10-CM

## 2018-07-21 NOTE — Patient Instructions (Addendum)
Medication Instructions:  No changes  If you need a refill on your cardiac medications before your next appointment, please call your pharmacy.    Lab work: No new labs needed   If you have labs (blood work) drawn today and your tests are completely normal, you will receive your results only by: . MyChart Message (if you have MyChart) OR . A paper copy in the mail If you have any lab test that is abnormal or we need to change your treatment, we will call you to review the results.   Testing/Procedures: No new testing needed   Follow-Up: At [facility], you and your health needs are our priority.  As part of our continuing mission to provide you with exceptional heart care, we have created designated Provider Care Teams.  These Care Teams include your primary Cardiologist (physician) and Advanced Practice Providers (APPs -  Physician Assistants and Nurse Practitioners) who all work together to provide you with the care you need, when you need it.  . You will need a follow up appointment as needed .   Please call our office 2 months in advance to schedule this appointment.    . Providers on your designated Care Team:   . Christopher Berge, NP . Ryan Dunn, PA-C . Jacquelyn Visser, PA-C  Any Other Special Instructions Will Be Listed Below (If Applicable).  For educational health videos Log in to : [email or web address] Or : [email or web address], password : [email or web address]   

## 2018-07-22 ENCOUNTER — Encounter (INDEPENDENT_AMBULATORY_CARE_PROVIDER_SITE_OTHER): Payer: Self-pay | Admitting: Orthopaedic Surgery

## 2018-07-22 ENCOUNTER — Ambulatory Visit (INDEPENDENT_AMBULATORY_CARE_PROVIDER_SITE_OTHER): Payer: Medicare HMO

## 2018-07-22 ENCOUNTER — Ambulatory Visit (INDEPENDENT_AMBULATORY_CARE_PROVIDER_SITE_OTHER): Payer: Medicare HMO | Admitting: Orthopaedic Surgery

## 2018-07-22 DIAGNOSIS — M7062 Trochanteric bursitis, left hip: Secondary | ICD-10-CM | POA: Diagnosis not present

## 2018-07-22 DIAGNOSIS — M25551 Pain in right hip: Secondary | ICD-10-CM

## 2018-07-22 DIAGNOSIS — M7061 Trochanteric bursitis, right hip: Secondary | ICD-10-CM

## 2018-07-22 DIAGNOSIS — Z96642 Presence of left artificial hip joint: Secondary | ICD-10-CM | POA: Diagnosis not present

## 2018-07-22 MED ORDER — LIDOCAINE HCL 1 % IJ SOLN
3.0000 mL | INTRAMUSCULAR | Status: AC | PRN
  Administered 2018-07-22: 3 mL

## 2018-07-22 MED ORDER — METHYLPREDNISOLONE ACETATE 40 MG/ML IJ SUSP
40.0000 mg | INTRAMUSCULAR | Status: AC | PRN
  Administered 2018-07-22: 40 mg via INTRA_ARTICULAR

## 2018-07-22 NOTE — Progress Notes (Signed)
Office Visit Note   Patient: [name]           Date of Birth: Feb 11, 1946           MRN: [id number] Visit Date: 07/22/2018              Requested by: Venia Carbon, MD [place], [place] [address] PCP: Venia Carbon, MD   Assessment & Plan: Visit Diagnoses:  1. Pain in right hip   2. Status post total replacement of left hip   3. Trochanteric bursitis, left hip   4. Trochanteric bursitis of right hip     Plan: I was able to provide a steroid injection in both hip trochanteric areas which helped her.  My next step would be to consider physical therapy if it is bothering her enough.  She will otherwise follow-up as needed.  If she ends up needing to have physical therapy she can call us and we can set this up for her.  If she continues to have problems with her back I have recommended her see her spine surgeon which I believe is Dr. Arnoldo Morale.  Follow-Up Instructions: Return if symptoms worsen or fail to improve.   Orders:  Orders Placed This Encounter  Procedures  . Large Joint Inj  . Large Joint Inj  . XR HIP UNILAT W OR W/O PELVIS 1V RIGHT   No orders of the defined types were placed in this encounter.     Procedures: Large Joint Inj: R greater trochanter on 07/22/2018 4:44 PM Indications: pain and diagnostic evaluation Details: 22 G 1.5 in needle, lateral approach  Arthrogram: No  Medications: 3 mL lidocaine 1 %; 40 mg methylPREDNISolone acetate 40 MG/ML Outcome: tolerated well, no immediate complications Procedure, treatment alternatives, risks and benefits explained, specific risks discussed. Consent was given by the patient. Immediately prior to procedure a time out was called to verify the correct patient, procedure, equipment, support staff and site/side marked as required. Patient was prepped and draped in the usual sterile fashion.   Large Joint Inj: L greater trochanter on 07/22/2018 4:44 PM Indications: pain and diagnostic  evaluation Details: 22 G 1.5 in needle, lateral approach  Arthrogram: No  Medications: 3 mL lidocaine 1 %; 40 mg methylPREDNISolone acetate 40 MG/ML Outcome: tolerated well, no immediate complications Procedure, treatment alternatives, risks and benefits explained, specific risks discussed. Consent was given by the patient. Immediately prior to procedure a time out was called to verify the correct patient, procedure, equipment, support staff and site/side marked as required. Patient was prepped and draped in the usual sterile fashion.       Clinical Data: No additional findings.   Subjective: Chief Complaint  Patient presents with  . Right Hip - Pain  . Left Hip - Routine Post Op  The patient comes in today for further evaluation and treatment of bilateral hip pain.  We actually performed a left total hip arthroplasty on a 6 months ago but she is pointing to the trochanteric area and the issue area of both hips and her low back.  She has have a history of previous lumbar spine surgery.  She has no groin pain on either side.  HPI  Review of Systems She currently denies any headache, chest pain, shortness of breath, fever, chills, nausea, vomiting  Objective: Vital Signs: There were no vitals taken for this visit.  Physical Exam She is alert and oriented x3 and in no acute distress Ortho Exam  Examination of both hips show the move fluidly with no pain going at all.  She has pain in the trochanteric area and issue area on both sides. Specialty Comments:  No specialty comments available.  Imaging: No results found.   PMFS History: Patient Active Problem List   Diagnosis Date Noted  . Unilateral primary osteoarthritis, left hip 01/08/2018  . Status post total replacement of left hip 01/08/2018  . Left hip pain 12/09/2017  . Sore throat 09/29/2017  . Pain syndrome, chronic 03/13/2017  . Facet arthropathy, lumbar 03/13/2017  . Advance directive discussed with patient  01/15/2016  . Spinal stenosis of lumbar region with neurogenic claudication 11/07/2015  . Abnormal EKG 11/05/2015  . Other intervertebral disc degeneration, lumbar region 06/01/2015  . Chronic back pain 03/29/2015  . Preventative health care 01/10/2015  . Osteoarthritis, hip, bilateral 11/28/2014  . Peripheral neuropathy 11/28/2014  . Type 2 diabetes, controlled, with neuropathy ([place]) 01/20/2014  . Episodic mood disorder ([place]) 01/20/2014  . IBS (irritable bowel syndrome) 12/27/2012  . Essential hypertension, benign 08/06/2010   Past Medical History:  Diagnosis Date  . Arthritis   . Confusion   . Diabetes mellitus without complication ([place])    type 2  . Hepatitis    pt. denies  . Hypertension   . Lumbar stenosis   . Memory loss   . Nocturia   . Numbness and tingling    legs and feet  . Wears glasses     Family History  Problem Relation Age of Onset  . Hypertension Other   . Cancer Other        ovarian  . Cancer Mother   . Heart disease Father     Past Surgical History:  Procedure Laterality Date  . [place]  . COLONOSCOPY    . HEMORRHOID SURGERY  2015  . JOINT REPLACEMENT     01-08-18 Dr. Kathrynn Speed  . TOTAL HIP ARTHROPLASTY Left 01/08/2018   Procedure: LEFT TOTAL HIP ARTHROPLASTY ANTERIOR APPROACH;  Surgeon: Mcarthur Rossetti, MD;  Location: [facility];  Service: Orthopedics;  Laterality: Left;  . TRANSFORAMINAL LUMBAR INTERBODY FUSION (TLIF) WITH PEDICLE SCREW FIXATION 2 LEVEL N/A 11/07/2015   Procedure: Lumbar three-four Lumbar four-five  transforaminal lumbar interbody fusion with interbody prosthesis posterior lateral arthrodesis and posterior segmental instrumentation;  Surgeon: Newman Pies, MD;  Location: [facility];  Service: Neurosurgery;  Laterality: N/A;   Social History   Occupational History  . Not on file  Tobacco Use  . Smoking status: Never Smoker  . Smokeless tobacco: Never Used  Substance and Sexual Activity  . Alcohol use: No     Comment: rarely  . Drug use: No  . Sexual activity: Not Currently

## 2018-07-23 ENCOUNTER — Other Ambulatory Visit: Payer: Self-pay | Admitting: Internal Medicine

## 2018-07-23 ENCOUNTER — Encounter (INDEPENDENT_AMBULATORY_CARE_PROVIDER_SITE_OTHER): Payer: Self-pay | Admitting: Orthopaedic Surgery

## 2018-07-23 ENCOUNTER — Ambulatory Visit: Payer: Self-pay | Admitting: Internal Medicine

## 2018-07-23 NOTE — Telephone Encounter (Signed)
Last filled 06-22-18 #120 Last OV 06-30-18 Next OV 12-30-18

## 2018-08-04 ENCOUNTER — Encounter (INDEPENDENT_AMBULATORY_CARE_PROVIDER_SITE_OTHER): Payer: Self-pay | Admitting: Orthopaedic Surgery

## 2018-08-09 ENCOUNTER — Telehealth: Payer: Self-pay

## 2018-08-09 NOTE — Telephone Encounter (Signed)
Find out if she is willing to take a cholesterol medication---at least once or twice a week (if not daily)-----per current guidelines

## 2018-08-09 NOTE — Telephone Encounter (Signed)
Received [facility] from [facility] stating pt has not record of statin therapy and she is diabetic. I looked at her previous med history we have in Epic and did not find any statins.

## 2018-08-11 NOTE — Telephone Encounter (Signed)
MyChart message sent to pt

## 2018-08-12 ENCOUNTER — Other Ambulatory Visit: Payer: Self-pay | Admitting: Internal Medicine

## 2018-08-13 NOTE — Telephone Encounter (Signed)
Last filled 07-23-18 #120 Last OV 06-30-18 Next OV 12-30-18 [facility]

## 2018-09-07 ENCOUNTER — Other Ambulatory Visit: Payer: Self-pay

## 2018-09-07 ENCOUNTER — Other Ambulatory Visit: Payer: Self-pay | Admitting: *Deleted

## 2018-09-07 MED ORDER — TRAMADOL HCL 50 MG PO TABS: 100.0000 mg | ORAL_TABLET | Freq: Three times a day (TID) | ORAL | 0 refills | Status: DC | PRN

## 2018-09-07 NOTE — Telephone Encounter (Signed)
Spoke to pt who states she is needing a refill of tramadol. Pt states that #120 tabs are not enough as she is taking 2tabs TID, on a regular basis, not PRN. Advised pt quantity was increased from #90 to #120 in Sept. Pt states Rx can be sent to [facility]. pls advise

## 2018-09-07 NOTE — Telephone Encounter (Signed)
Done already with other notice

## 2018-09-07 NOTE — Telephone Encounter (Signed)
Patient comment: I use 6 tramadol and 4 tylenol daily. that prescription only gives me 20 days, so I only have 4 tablets left. Please make pres. 180. Thanks  Last filled 08-13-18 Last OV 06-30-18 Next OV 12-30-18 [facility]

## 2018-09-13 ENCOUNTER — Telehealth (INDEPENDENT_AMBULATORY_CARE_PROVIDER_SITE_OTHER): Payer: Self-pay | Admitting: Orthopedic Surgery

## 2018-09-13 NOTE — Telephone Encounter (Signed)
Patient left a message wanting to know if there was any other medication besides the Gabapentin and Tramadol for the bursitis because it is not working.  CB#[phone].  Thank you.

## 2018-09-13 NOTE — Telephone Encounter (Signed)
Please see message below and advise.

## 2018-09-14 NOTE — Telephone Encounter (Signed)
Sorry this is a CB pt and I accidentally forwarded to dean. See below.

## 2018-09-14 NOTE — Telephone Encounter (Signed)
She can try over-the-counter anti-inflammatories such as Aleve or ibuprofen.  She can also try topical over-the-counter medication such as Aspercreme or Biofreeze.

## 2018-09-14 NOTE — Telephone Encounter (Signed)
Patient aware of the below message  

## 2018-09-14 NOTE — Telephone Encounter (Signed)
Fw to cb not my patient

## 2018-09-14 NOTE — Telephone Encounter (Signed)
Patient left a message wanting to know if there was any other medication besides the Gabapentin and Tramadol for the bursitis because it is not working.  CB#[phone].  Thank you.

## 2018-09-15 ENCOUNTER — Encounter (INDEPENDENT_AMBULATORY_CARE_PROVIDER_SITE_OTHER): Payer: Self-pay | Admitting: Orthopaedic Surgery

## 2018-09-15 ENCOUNTER — Other Ambulatory Visit: Payer: Self-pay | Admitting: Internal Medicine

## 2018-09-15 DIAGNOSIS — F320 Major depressive disorder, single episode, mild: Secondary | ICD-10-CM

## 2018-09-15 NOTE — Telephone Encounter (Signed)
Last OV 06-30-18 Next OV 4-23

## 2018-09-20 ENCOUNTER — Other Ambulatory Visit: Payer: Self-pay | Admitting: Internal Medicine

## 2018-09-20 ENCOUNTER — Encounter (INDEPENDENT_AMBULATORY_CARE_PROVIDER_SITE_OTHER): Payer: Self-pay | Admitting: Orthopaedic Surgery

## 2018-10-05 ENCOUNTER — Other Ambulatory Visit: Payer: Self-pay | Admitting: Internal Medicine

## 2018-10-05 MED ORDER — TRAMADOL HCL 50 MG PO TABS: 100.0000 mg | ORAL_TABLET | Freq: Three times a day (TID) | ORAL | 0 refills | Status: DC | PRN

## 2018-10-05 NOTE — Telephone Encounter (Signed)
Last filled 09-07-18 #180 Last OV 06-30-18 Next OV 12-30-18 [facility]

## 2018-10-05 NOTE — Telephone Encounter (Signed)
Pt called requesting a refill on the Tramadol. [facility] uses [facility] in [place].

## 2018-10-07 ENCOUNTER — Ambulatory Visit (INDEPENDENT_AMBULATORY_CARE_PROVIDER_SITE_OTHER): Payer: Medicare HMO | Admitting: Internal Medicine

## 2018-10-07 ENCOUNTER — Encounter: Payer: Self-pay | Admitting: Internal Medicine

## 2018-10-07 ENCOUNTER — Other Ambulatory Visit: Payer: Self-pay | Admitting: Internal Medicine

## 2018-10-07 VITALS — BP 116/76 | HR 105 | Temp 97.9°F | Ht 62.0 in | Wt 187.0 lb

## 2018-10-07 DIAGNOSIS — I872 Venous insufficiency (chronic) (peripheral): Secondary | ICD-10-CM | POA: Diagnosis not present

## 2018-10-07 DIAGNOSIS — N3941 Urge incontinence: Secondary | ICD-10-CM | POA: Diagnosis not present

## 2018-10-07 DIAGNOSIS — B351 Tinea unguium: Secondary | ICD-10-CM | POA: Diagnosis not present

## 2018-10-07 NOTE — Assessment & Plan Note (Signed)
Mild Using Vicks Reassured --not too bad

## 2018-10-07 NOTE — Assessment & Plan Note (Signed)
Mild Discussed considering support hose/socks for prolonged trips or immobilzation

## 2018-10-07 NOTE — Progress Notes (Signed)
Subjective:    Patient ID: [name], female    DOB: 1946-08-19, 73 y.o.   MRN: [id number]  HPI Here due to concerns about urinary incontinence  Notes urinary urgency ---especially after sitting for a while (and after coffee) Also in bed--has nocturia x as much as 3 Leakage then--has started using pads  No dysuria or hematuria  Also wants me to check her toenails She has sent me pictures of her thickened discolored nails  Current Outpatient Medications on File Prior to Visit  Medication Sig Dispense Refill  . ACCU-CHEK AVIVA PLUS test strip USE TO TEST BLOOD SUGAR ONCE DAILY DX: R73.03 100 each 3  . ACCU-CHEK SOFTCLIX LANCETS lancets USE AS INSTRUCTED 100 each 3  . acetaminophen (TYLENOL) 500 MG tablet Take 500 mg by mouth every 4 (four) hours as needed.    . Amino Acids (AMINO ACID PO) Take 1 tablet by mouth 2 (two) times daily.    . Blood Glucose Monitoring Suppl (ACCU-CHEK AVIVA PLUS) w/Device KIT Use to test blood sugar once daily Dx: R73.03 1 kit 0  . Chromium Picolinate 1000 MCG TABS Take 1,000 mcg by mouth daily.     . Coenzyme Q10 (CO Q 10) 100 MG CAPS Take 100 mg by mouth daily.     Marland Kitchen docusate sodium (COLACE) 100 MG capsule Take 100 mg by mouth daily as needed.     . DULoxetine (CYMBALTA) 60 MG capsule TAKE 1 CAPSULE (60 MG TOTAL) BY MOUTH 2 (TWO) TIMES DAILY. 180 capsule 3  . lisinopril-hydrochlorothiazide (PRINZIDE,ZESTORETIC) 20-25 MG tablet TAKE 2 TABLETS BY MOUTH DAILY 180 tablet 3  . meloxicam (MOBIC) 7.5 MG tablet Take 7.5 mg by mouth daily.  3  . metFORMIN (GLUCOPHAGE-XR) 500 MG 24 hr tablet TAKE 1 TABLET (500 MG TOTAL) BY MOUTH DAILY WITH BREAKFAST. 90 tablet 3  . Multiple Vitamins-Minerals (MULTIVITAMIN WITH MINERALS) tablet Take 1 tablet by mouth daily.     . Omega-3 Fatty Acids (FISH OIL) 1000 MG CAPS Take 1,000 mg by mouth 2 (two) times daily.     . Probiotic Product ([facility] SENIOR PROBIOTIC) CAPS Take 1 capsule by mouth daily. 1 capsule 0  . Soya Lecithin  1200 MG CAPS Take 1,200 mg by mouth 3 (three) times daily.     . SUPER B COMPLEX/C PO Take 1 tablet by mouth daily.    Marland Kitchen tiZANidine (ZANAFLEX) 4 MG tablet TAKE 1 TABLET (4 MG TOTAL) BY MOUTH EVERY 8 (EIGHT) HOURS AS NEEDED FOR MUSCLE SPASMS. 270 tablet 0  . traMADol (ULTRAM) 50 MG tablet Take 2 tablets (100 mg total) by mouth 3 (three) times daily as needed. 180 tablet 0  . vitamin C (ASCORBIC ACID) 500 MG tablet Take 500 mg by mouth daily.     No current facility-administered medications on file prior to visit.     Allergies  Allergen Reactions  . Acyclovir And Related     Past Medical History:  Diagnosis Date  . Arthritis   . Confusion   . Diabetes mellitus without complication ([place])    type 2  . Hepatitis    pt. denies  . Hypertension   . Lumbar stenosis   . Memory loss   . Nocturia   . Numbness and tingling    legs and feet  . Wears glasses     Past Surgical History:  Procedure Laterality Date  . [place]  . COLONOSCOPY    . HEMORRHOID SURGERY  2015  .  JOINT REPLACEMENT     01-08-18 Dr. Kathrynn Speed  . TOTAL HIP ARTHROPLASTY Left 01/08/2018   Procedure: LEFT TOTAL HIP ARTHROPLASTY ANTERIOR APPROACH;  Surgeon: Mcarthur Rossetti, MD;  Location: [facility];  Service: Orthopedics;  Laterality: Left;  . TRANSFORAMINAL LUMBAR INTERBODY FUSION (TLIF) WITH PEDICLE SCREW FIXATION 2 LEVEL N/A 11/07/2015   Procedure: Lumbar three-four Lumbar four-five  transforaminal lumbar interbody fusion with interbody prosthesis posterior lateral arthrodesis and posterior segmental instrumentation;  Surgeon: Newman Pies, MD;  Location: [facility];  Service: Neurosurgery;  Laterality: N/A;    Family History  Problem Relation Age of Onset  . Hypertension Other   . Cancer Other        ovarian  . Cancer Mother   . Heart disease Father     Social History   Socioeconomic History  . Marital status: Divorced    Spouse name: Not on file  . Number of children: 4  . Years  of education: Not on file  . Highest education level: Associate degree: occupational, Hotel manager, or vocational program  Occupational History  . Not on file  Social Needs  . Financial resource strain: Not very hard  . Food insecurity:    Worry: Never true    Inability: Never true  . Transportation needs:    Medical: Yes    Non-medical: Yes  Tobacco Use  . Smoking status: Never Smoker  . Smokeless tobacco: Never Used  Substance and Sexual Activity  . Alcohol use: No    Comment: rarely  . Drug use: No  . Sexual activity: Not Currently  Lifestyle  . Physical activity:    Days per week: 0 days    Minutes per session: 0 min  . Stress: To some extent  Relationships  . Social connections:    Talks on phone: Not on file    Gets together: Not on file    Attends religious service: Never    Active member of club or organization: No    Attends meetings of clubs or organizations: Never    Relationship status: Divorced  . Intimate partner violence:    Fear of current or ex partner: No    Emotionally abused: No    Physically abused: No    Forced sexual activity: No  Other Topics Concern  . Not on file  Social History Narrative   Has a living will.   Son is health care POA   Would accept resuscitation.  Would not want prolonged life support if futile.   Probably would not want tube feeds if cognitively unaware   Review of Systems  No fever Not sick Diabetes control is still good     Objective:   Physical Exam  Cardiovascular: Intact distal pulses.  Musculoskeletal:     Comments: No sig edema Superficial varicosities and bluish discoloration  Skin:  Mycotic toenails None overly thick and no ingrowing (reassured---overall fairly mild)           Assessment & Plan:

## 2018-10-07 NOTE — Assessment & Plan Note (Signed)
Not bad Discussed Kegel's  Would not recommend meds

## 2018-10-11 ENCOUNTER — Other Ambulatory Visit: Payer: Self-pay | Admitting: Internal Medicine

## 2018-10-15 ENCOUNTER — Other Ambulatory Visit: Payer: Self-pay | Admitting: Internal Medicine

## 2018-10-20 ENCOUNTER — Ambulatory Visit (INDEPENDENT_AMBULATORY_CARE_PROVIDER_SITE_OTHER): Payer: Medicare Other | Admitting: Orthopaedic Surgery

## 2018-10-20 ENCOUNTER — Ambulatory Visit (INDEPENDENT_AMBULATORY_CARE_PROVIDER_SITE_OTHER): Payer: Medicare Other

## 2018-10-20 ENCOUNTER — Encounter (INDEPENDENT_AMBULATORY_CARE_PROVIDER_SITE_OTHER): Payer: Self-pay | Admitting: Orthopaedic Surgery

## 2018-10-20 DIAGNOSIS — M25551 Pain in right hip: Secondary | ICD-10-CM

## 2018-10-20 DIAGNOSIS — M25552 Pain in left hip: Secondary | ICD-10-CM

## 2018-10-20 DIAGNOSIS — Z96642 Presence of left artificial hip joint: Secondary | ICD-10-CM

## 2018-10-20 NOTE — Progress Notes (Signed)
Office Visit Note   Patient: [name]           Date of Birth: Dec 21, 1945           MRN: [id number] Visit Date: 10/20/2018              Requested by: Venia Carbon, MD [place], [place] [address] PCP: Venia Carbon, MD   Assessment & Plan: Visit Diagnoses:  1. Bilateral hip pain   2. Status post total replacement of left hip     Plan: From my standpoint, I do not feel this is a hip issue.  Her left total hip arthroplasty looks good and her right hip shows no significant disease and at all.  Given the fact that the injections of the trochanteric areas did not help her and the fact that she is describing pain and pointing to areas that are more low back and SI joint related, I feel that a follow-up with her neurosurgeon would be appropriate at this standpoint.  From an orthopedic standpoint.  There is nothing else that I have to offer.  Certainly SI joint injections potentially could help but I do feel an evaluation by her spine surgeon is appropriate at this standpoint.  I will see her back in about 6 months.  We can have a standing low AP pelvis at that visit.  Follow-Up Instructions: Return in about 6 months (around 04/20/2019).   Orders:  Orders Placed This Encounter  Procedures  . XR HIPS BILAT W OR W/O PELVIS 2V   No orders of the defined types were placed in this encounter.     Procedures: No procedures performed   Clinical Data: No additional findings.   Subjective: Chief Complaint  Patient presents with  . Left Hip - Follow-up  The patient is being seen in follow-up as post a left total hip arthroplasty we did 9 months ago.  She does ambulate using walker.  She is very tearful in terms of amount of pain she is having in her hips but she points to the low back and pelvis is source of her pain.  At her last visit several months ago I did provide steroid injections to the trochanteric area of both hips and this did not help her.  She  denies any groin pain on either side.  She ambulates with a walker.  She has had no other acute change in her medical status but she is very frustrated and tearful.  HPI  Review of Systems She currently denies any headache, chest pain, shortness of breath, fever, chills, nausea, vomiting.  Objective: Vital Signs: There were no vitals taken for this visit.  Physical Exam She is alert and orient x3 and in no acute distress but obvious discomfort Ortho Exam Examination of both hips show that he moves fluidly with no pain in the groin at all and she has excellent internal and external rotation.  She does have some mild pain to palpation of the trochanteric area on both hips.  All of her pain seems to be the low back when examined today and she has severe pain to palpation of the lower aspect of the lumbar spine and the pelvis and SI joints. Specialty Comments:  No specialty comments available.  Imaging: Xr Hips Bilat W Or W/o Pelvis 2v  Result Date: 10/20/2018 An AP pelvis and lateral of both hips shows a well-seated total hip arthroplasty on the left side with no complicating features.  There is no significant arthritis at all on the right side with very well-maintained joint space on the right side.  At the top of the pelvis hardware is seen from previous lumbar spine surgery.  There is some evidence of sacroiliac joint arthritis    PMFS History: Patient Active Problem List   Diagnosis Date Noted  . Urge incontinence 10/07/2018  . Mycotic toenails 10/07/2018  . Venous insufficiency 10/07/2018  . Unilateral primary osteoarthritis, left hip 01/08/2018  . Status post total replacement of left hip 01/08/2018  . Left hip pain 12/09/2017  . Sore throat 09/29/2017  . Pain syndrome, chronic 03/13/2017  . Facet arthropathy, lumbar 03/13/2017  . Advance directive discussed with patient 01/15/2016  . Spinal stenosis of lumbar region with neurogenic claudication 11/07/2015  . Abnormal EKG  11/05/2015  . Other intervertebral disc degeneration, lumbar region 06/01/2015  . Chronic back pain 03/29/2015  . Preventative health care 01/10/2015  . Osteoarthritis, hip, bilateral 11/28/2014  . Peripheral neuropathy 11/28/2014  . Type 2 diabetes, controlled, with neuropathy ([place]) 01/20/2014  . Episodic mood disorder ([place]) 01/20/2014  . IBS (irritable bowel syndrome) 12/27/2012  . Essential hypertension, benign 08/06/2010   Past Medical History:  Diagnosis Date  . Arthritis   . Confusion   . Diabetes mellitus without complication ([place])    type 2  . Hepatitis    pt. denies  . Hypertension   . Lumbar stenosis   . Memory loss   . Nocturia   . Numbness and tingling    legs and feet  . Wears glasses     Family History  Problem Relation Age of Onset  . Hypertension Other   . Cancer Other        ovarian  . Cancer Mother   . Heart disease Father     Past Surgical History:  Procedure Laterality Date  . [place]  . COLONOSCOPY    . HEMORRHOID SURGERY  2015  . JOINT REPLACEMENT     01-08-18 Dr. Kathrynn Speed  . TOTAL HIP ARTHROPLASTY Left 01/08/2018   Procedure: LEFT TOTAL HIP ARTHROPLASTY ANTERIOR APPROACH;  Surgeon: Mcarthur Rossetti, MD;  Location: [facility];  Service: Orthopedics;  Laterality: Left;  . TRANSFORAMINAL LUMBAR INTERBODY FUSION (TLIF) WITH PEDICLE SCREW FIXATION 2 LEVEL N/A 11/07/2015   Procedure: Lumbar three-four Lumbar four-five  transforaminal lumbar interbody fusion with interbody prosthesis posterior lateral arthrodesis and posterior segmental instrumentation;  Surgeon: Newman Pies, MD;  Location: [facility];  Service: Neurosurgery;  Laterality: N/A;   Social History   Occupational History  . Not on file  Tobacco Use  . Smoking status: Never Smoker  . Smokeless tobacco: Never Used  Substance and Sexual Activity  . Alcohol use: No    Comment: rarely  . Drug use: No  . Sexual activity: Not Currently

## 2018-10-22 DIAGNOSIS — M5416 Radiculopathy, lumbar region: Secondary | ICD-10-CM

## 2018-10-29 ENCOUNTER — Other Ambulatory Visit: Payer: Self-pay | Admitting: Internal Medicine

## 2018-11-02 DIAGNOSIS — M545 Low back pain: Secondary | ICD-10-CM | POA: Diagnosis not present

## 2018-11-02 DIAGNOSIS — R262 Difficulty in walking, not elsewhere classified: Secondary | ICD-10-CM | POA: Diagnosis not present

## 2018-11-05 DIAGNOSIS — M545 Low back pain: Secondary | ICD-10-CM | POA: Diagnosis not present

## 2018-11-05 DIAGNOSIS — R262 Difficulty in walking, not elsewhere classified: Secondary | ICD-10-CM | POA: Diagnosis not present

## 2018-11-06 ENCOUNTER — Other Ambulatory Visit: Payer: Self-pay | Admitting: Internal Medicine

## 2018-11-08 NOTE — Telephone Encounter (Signed)
Electronic refill request Tramadol Last refill 10/05/18 #180 Last office visit 10/07/18 Upcoming appointment 12/30/18

## 2018-11-09 NOTE — Telephone Encounter (Signed)
Eprescribed.

## 2018-11-09 NOTE — Telephone Encounter (Signed)
See refill request sent by Emelia Salisbury 11/08/2018.

## 2018-11-12 DIAGNOSIS — M545 Low back pain: Secondary | ICD-10-CM | POA: Diagnosis not present

## 2018-11-12 DIAGNOSIS — R262 Difficulty in walking, not elsewhere classified: Secondary | ICD-10-CM | POA: Diagnosis not present

## 2018-11-15 DIAGNOSIS — M545 Low back pain: Secondary | ICD-10-CM | POA: Diagnosis not present

## 2018-11-15 DIAGNOSIS — R262 Difficulty in walking, not elsewhere classified: Secondary | ICD-10-CM | POA: Diagnosis not present

## 2018-11-16 ENCOUNTER — Encounter: Payer: Self-pay | Admitting: Internal Medicine

## 2018-11-16 ENCOUNTER — Ambulatory Visit (INDEPENDENT_AMBULATORY_CARE_PROVIDER_SITE_OTHER): Payer: Medicare Other | Admitting: Internal Medicine

## 2018-11-16 VITALS — BP 122/80 | HR 96 | Temp 97.7°F | Ht 62.0 in | Wt 186.0 lb

## 2018-11-16 DIAGNOSIS — M1711 Unilateral primary osteoarthritis, right knee: Secondary | ICD-10-CM | POA: Diagnosis not present

## 2018-11-16 DIAGNOSIS — M48062 Spinal stenosis, lumbar region with neurogenic claudication: Secondary | ICD-10-CM

## 2018-11-16 NOTE — Patient Instructions (Signed)
Please decrease the afternoon gabapentin to just one pill (and take earlier---like 1-2PM). If there is no change in your pain, you can try to reduce the morning gabapentin to just 1 capsule also.

## 2018-11-16 NOTE — Assessment & Plan Note (Signed)
Sedation with daytime gabapentin Discussed cutting back on this

## 2018-11-16 NOTE — Assessment & Plan Note (Signed)
Has some crepitus if she moves her knee with flexion and rotation No major findings on exam Doesn't appear to be close to needing any action on this

## 2018-11-16 NOTE — Progress Notes (Signed)
Subjective:    Patient ID: [name], female    DOB: October 25, 1945, 73 y.o.   MRN: [id number]  HPI Here due to knee and leg pain  Has noticed some clicking in right knee Noticed it in the past 2-4 weeks Has tried to ignore it but it worries her Thinks it may be more noticeable when she twists her knee Not really painful  Right leg pain overall is better Left leg pain is now worse Can't sleep on right side though Feels she is getting weaker---needs her arms to pull herself up if she bends over to pick something up Going to PT--but mostly stretching  Current Outpatient Medications on File Prior to Visit  Medication Sig Dispense Refill  . ACCU-CHEK AVIVA PLUS test strip USE TO TEST BLOOD SUGAR ONCE DAILY DX: R73.03 100 each 3  . ACCU-CHEK SOFTCLIX LANCETS lancets USE AS INSTRUCTED 100 each 3  . acetaminophen (TYLENOL) 500 MG tablet Take 500 mg by mouth every 4 (four) hours as needed.    . Amino Acids (AMINO ACID PO) Take 1 tablet by mouth 2 (two) times daily.    . Blood Glucose Monitoring Suppl (ACCU-CHEK AVIVA PLUS) w/Device KIT Use to test blood sugar once daily Dx: R73.03 1 kit 0  . Chromium Picolinate 1000 MCG TABS Take 1,000 mcg by mouth daily.     . Coenzyme Q10 (CO Q 10) 100 MG CAPS Take 100 mg by mouth daily.     Marland Kitchen docusate sodium (COLACE) 100 MG capsule Take 100 mg by mouth daily as needed.     . DULoxetine (CYMBALTA) 60 MG capsule TAKE 1 CAPSULE (60 MG TOTAL) BY MOUTH 2 (TWO) TIMES DAILY. 180 capsule 3  . gabapentin (NEURONTIN) 300 MG capsule TAKE TWO CAPSULES BY MOUTH TWICE A DAY TAKE 3 CAPSULES AT BEDTIME 630 capsule 5  . lisinopril-hydrochlorothiazide (PRINZIDE,ZESTORETIC) 20-25 MG tablet TAKE 2 TABLETS BY MOUTH DAILY 180 tablet 3  . meloxicam (MOBIC) 7.5 MG tablet TAKE 1 TABLET BY MOUTH EVERY DAY 90 tablet 3  . metFORMIN (GLUCOPHAGE-XR) 500 MG 24 hr tablet TAKE 1 TABLET (500 MG TOTAL) BY MOUTH DAILY WITH BREAKFAST. 90 tablet 3  . Multiple Vitamins-Minerals  (MULTIVITAMIN WITH MINERALS) tablet Take 1 tablet by mouth daily.     . Omega-3 Fatty Acids (FISH OIL) 1000 MG CAPS Take 1,800 mg by mouth.     . Probiotic Product ([employer]S SENIOR PROBIOTIC) CAPS Take 1 capsule by mouth daily. 1 capsule 0  . Soya Lecithin 1200 MG CAPS Take 1,200 mg by mouth 3 (three) times daily.     . SUPER B COMPLEX/C PO Take 1 tablet by mouth daily.    Marland Kitchen tiZANidine (ZANAFLEX) 4 MG tablet TAKE 1 TABLET (4 MG TOTAL) BY MOUTH EVERY 8 (EIGHT) HOURS AS NEEDED FOR MUSCLE SPASMS. 270 tablet 0  . traMADol (ULTRAM) 50 MG tablet TAKE 2 TABLETS (100 MG TOTAL) BY MOUTH 3 (THREE) TIMES DAILY AS NEEDED. 180 tablet 0  . vitamin C (ASCORBIC ACID) 500 MG tablet Take 500 mg by mouth daily.     No current facility-administered medications on file prior to visit.     Allergies  Allergen Reactions  . Acyclovir And Related     Past Medical History:  Diagnosis Date  . Arthritis   . Confusion   . Diabetes mellitus without complication ([place])    type 2  . Hepatitis    pt. denies  . Hypertension   . Lumbar stenosis   .  Memory loss   . Nocturia   . Numbness and tingling    legs and feet  . Wears glasses     Past Surgical History:  Procedure Laterality Date  . [place]  . COLONOSCOPY    . HEMORRHOID SURGERY  2015  . JOINT REPLACEMENT     01-08-18 Dr. Kathrynn Speed  . TOTAL HIP ARTHROPLASTY Left 01/08/2018   Procedure: LEFT TOTAL HIP ARTHROPLASTY ANTERIOR APPROACH;  Surgeon: Mcarthur Rossetti, MD;  Location: [facility];  Service: Orthopedics;  Laterality: Left;  . TRANSFORAMINAL LUMBAR INTERBODY FUSION (TLIF) WITH PEDICLE SCREW FIXATION 2 LEVEL N/A 11/07/2015   Procedure: Lumbar three-four Lumbar four-five  transforaminal lumbar interbody fusion with interbody prosthesis posterior lateral arthrodesis and posterior segmental instrumentation;  Surgeon: Newman Pies, MD;  Location: [facility];  Service: Neurosurgery;  Laterality: N/A;    Family History  Problem Relation  Age of Onset  . Hypertension Other   . Cancer Other        ovarian  . Cancer Mother   . Heart disease Father     Social History   Socioeconomic History  . Marital status: Divorced    Spouse name: Not on file  . Number of children: 4  . Years of education: Not on file  . Highest education level: Associate degree: occupational, Hotel manager, or vocational program  Occupational History  . Not on file  Social Needs  . Financial resource strain: Not very hard  . Food insecurity:    Worry: Never true    Inability: Never true  . Transportation needs:    Medical: Yes    Non-medical: Yes  Tobacco Use  . Smoking status: Never Smoker  . Smokeless tobacco: Never Used  Substance and Sexual Activity  . Alcohol use: No    Comment: rarely  . Drug use: No  . Sexual activity: Not Currently  Lifestyle  . Physical activity:    Days per week: 0 days    Minutes per session: 0 min  . Stress: To some extent  Relationships  . Social connections:    Talks on phone: Not on file    Gets together: Not on file    Attends religious service: Never    Active member of club or organization: No    Attends meetings of clubs or organizations: Never    Relationship status: Divorced  . Intimate partner violence:    Fear of current or ex partner: No    Emotionally abused: No    Physically abused: No    Forced sexual activity: No  Other Topics Concern  . Not on file  Social History Narrative   Has a living will.   Son is health care POA   Would accept resuscitation.  Would not want prolonged life support if futile.   Probably would not want tube feeds if cognitively unaware   Review of Systems  No sig joint swelling Ongoing right low back pain No fevers Has been sleeping more---worried about the gabapentin (3PM every day)     Objective:   Physical Exam  Constitutional: She appears well-developed. No distress.  Musculoskeletal:     Comments: No spine tenderness Normal ROM in hips Right  knee--no effusion, no ligament findings, no meniscus findings  Neurological:  Symmetric leg weakness---trouble getting up on table           Assessment & Plan:

## 2018-11-18 NOTE — Telephone Encounter (Signed)
Please follow up on this to see what action is needed (will pharmacy fill the rest?)

## 2018-11-19 DIAGNOSIS — R262 Difficulty in walking, not elsewhere classified: Secondary | ICD-10-CM | POA: Diagnosis not present

## 2018-11-19 DIAGNOSIS — M545 Low back pain: Secondary | ICD-10-CM | POA: Diagnosis not present

## 2018-11-21 ENCOUNTER — Other Ambulatory Visit: Payer: Self-pay | Admitting: Family Medicine

## 2018-11-22 NOTE — Telephone Encounter (Signed)
Insurance will only allow 7 day supply at a time. No PA can be done.   Last filled 11-09-18 #42 Last OV 11-16-18 Next OV 12-30-18 [facility]

## 2018-11-23 DIAGNOSIS — R262 Difficulty in walking, not elsewhere classified: Secondary | ICD-10-CM | POA: Diagnosis not present

## 2018-11-23 DIAGNOSIS — M545 Low back pain: Secondary | ICD-10-CM | POA: Diagnosis not present

## 2018-11-26 DIAGNOSIS — R262 Difficulty in walking, not elsewhere classified: Secondary | ICD-10-CM | POA: Diagnosis not present

## 2018-11-26 DIAGNOSIS — M545 Low back pain: Secondary | ICD-10-CM | POA: Diagnosis not present

## 2018-12-07 NOTE — Telephone Encounter (Signed)
Robert with [facility] member services left v/m that pt is concerned about going to pharmacy weekly for tramadol refills. Herbie [place] said pt is limited to 7 day supply of tramadol. A 30 day supply requires coverage determination which entails a PA explaining why pt needs to go over quantity limit. Robert request PA process to be initiated and pt to be called at [phone].

## 2018-12-10 ENCOUNTER — Other Ambulatory Visit: Payer: Self-pay | Admitting: Internal Medicine

## 2018-12-14 ENCOUNTER — Telehealth: Payer: Self-pay

## 2018-12-14 NOTE — Telephone Encounter (Signed)
Inbound call to triage - patient reports Tramadol has been denied by Medicare. States PCP needs to give them more information.  Last fill date: 11/22/18. 42 pills with 3 refills.

## 2018-12-15 MED ORDER — MELOXICAM 15 MG PO TABS: 15.0000 mg | ORAL_TABLET | Freq: Every day | ORAL | 3 refills | Status: DC

## 2018-12-15 MED ORDER — TRAMADOL HCL 50 MG PO TABS: 100.0000 mg | ORAL_TABLET | Freq: Three times a day (TID) | ORAL | 0 refills | Status: DC | PRN

## 2018-12-15 NOTE — Telephone Encounter (Signed)
Spoke to pt

## 2018-12-15 NOTE — Telephone Encounter (Signed)
Check with pharmacy I will approve #180 x 0 for 1 month if that will go through

## 2018-12-15 NOTE — Telephone Encounter (Signed)
Spoke to [place] at the pharmacy. She said that the pt just picked up a rx yesterday. Said to send in #180/0 to fill on or after 12-19-18. Spoke to pt. She said 180 is fine. She has not been doing more than 6 a day anyway.  She asked to increase the meloxicam. Says it works a little but stops near the end of the day

## 2018-12-15 NOTE — Telephone Encounter (Signed)
Please let her know that I doubled the meloxicam dose to 15mg  daily Make sure she doesn't take more than 6 of the tramadol a day----I will not be able to refill it early

## 2018-12-30 ENCOUNTER — Ambulatory Visit (INDEPENDENT_AMBULATORY_CARE_PROVIDER_SITE_OTHER): Payer: Medicare Other | Admitting: Internal Medicine

## 2018-12-30 ENCOUNTER — Encounter: Payer: Self-pay | Admitting: Internal Medicine

## 2018-12-30 DIAGNOSIS — M545 Low back pain: Secondary | ICD-10-CM | POA: Diagnosis not present

## 2018-12-30 DIAGNOSIS — G8929 Other chronic pain: Secondary | ICD-10-CM

## 2018-12-30 DIAGNOSIS — E1140 Type 2 diabetes mellitus with diabetic neuropathy, unspecified: Secondary | ICD-10-CM

## 2018-12-30 DIAGNOSIS — F39 Unspecified mood [affective] disorder: Secondary | ICD-10-CM

## 2018-12-30 DIAGNOSIS — M48062 Spinal stenosis, lumbar region with neurogenic claudication: Secondary | ICD-10-CM

## 2018-12-30 NOTE — Assessment & Plan Note (Signed)
Ongoing symptoms but stable at this point

## 2018-12-30 NOTE — Progress Notes (Signed)
Subjective:    Patient ID: [name], female    DOB: August 29, 1946, 73 y.o.   MRN: [id number]  HPI Virtual visit for review of diabetes and other chronic health conditions Identification done Discussed billing and she gave consent She is in her home and I am in my office  She is doing okay with the social isolation--used to being alone Does go out to do shopping--but minimizing  Did have fall recently--broke lens in glasses and got black eye and some scrapes  Trying "some other stuff" for her back Really notices it if she misses tramadol dose--still taking 6 per day Has started CBD oil --but recently started so not sure  Checks sugars 3 times per week Average 97 No hypoglycemic reactions Feels her foot sensation is intact No sores but does get some tingling at times--especially left foot  Some ongoing left hip pain Does okay with topical Rx  Mood has been generally okay Still largely dependent on her pain control---was very bad when she missed tramadol doses  Still with gabapentin 600/300/900 and meloxicam as well Current Outpatient Medications on File Prior to Visit  Medication Sig Dispense Refill  . ACCU-CHEK AVIVA PLUS test strip USE TO TEST BLOOD SUGAR ONCE DAILY DX: R73.03 100 each 3  . ACCU-CHEK SOFTCLIX LANCETS lancets USE AS INSTRUCTED 100 each 3  . acetaminophen (TYLENOL) 500 MG tablet Take 500 mg by mouth every 4 (four) hours as needed.    . Blood Glucose Monitoring Suppl (ACCU-CHEK AVIVA PLUS) w/Device KIT Use to test blood sugar once daily Dx: R73.03 1 kit 0  . CANNABIDIOL PO Take by mouth.    . Chromium Picolinate 1000 MCG TABS Take 1,000 mcg by mouth daily.     . Coenzyme Q10 (CO Q 10) 100 MG CAPS Take 100 mg by mouth daily.     . Digestive Enzymes CAPS Take 2 capsules by mouth.     . DULoxetine (CYMBALTA) 60 MG capsule TAKE 1 CAPSULE (60 MG TOTAL) BY MOUTH 2 (TWO) TIMES DAILY. 180 capsule 3  . gabapentin (NEURONTIN) 300 MG capsule TAKE TWO CAPSULES BY  MOUTH TWICE A DAY TAKE 3 CAPSULES AT BEDTIME 630 capsule 5  . lisinopril-hydrochlorothiazide (PRINZIDE,ZESTORETIC) 20-25 MG tablet TAKE 2 TABLETS BY MOUTH DAILY 180 tablet 3  . meloxicam (MOBIC) 15 MG tablet Take 1 tablet (15 mg total) by mouth daily. 90 tablet 3  . metFORMIN (GLUCOPHAGE-XR) 500 MG 24 hr tablet TAKE 1 TABLET (500 MG TOTAL) BY MOUTH DAILY WITH BREAKFAST. 90 tablet 3  . Multiple Vitamins-Minerals (MULTIVITAMIN WITH MINERALS) tablet Take 1 tablet by mouth daily.     . Omega-3 Fatty Acids (FISH OIL) 1000 MG CAPS Take 1,800 mg by mouth.     . Probiotic Product (CVS SENIOR PROBIOTIC) CAPS Take 1 capsule by mouth daily. 1 capsule 0  . Soya Lecithin 1200 MG CAPS Take 1,200 mg by mouth 3 (three) times daily.     . traMADol (ULTRAM) 50 MG tablet Take 2 tablets (100 mg total) by mouth 3 (three) times daily as needed. 180 tablet 0  . vitamin C (ASCORBIC ACID) 500 MG tablet Take 500 mg by mouth daily.     No current facility-administered medications on file prior to visit.     Allergies  Allergen Reactions  . Acyclovir And Related     Past Medical History:  Diagnosis Date  . Arthritis   . Confusion   . Diabetes mellitus without complication ([place])    type  2  . Hepatitis    pt. denies  . Hypertension   . Lumbar stenosis   . Memory loss   . Nocturia   . Numbness and tingling    legs and feet  . Wears glasses     Past Surgical History:  Procedure Laterality Date  . [place]  . COLONOSCOPY    . HEMORRHOID SURGERY  2015  . JOINT REPLACEMENT     01-08-18 Dr. Kathrynn Speed  . TOTAL HIP ARTHROPLASTY Left 01/08/2018   Procedure: LEFT TOTAL HIP ARTHROPLASTY ANTERIOR APPROACH;  Surgeon: Mcarthur Rossetti, MD;  Location: [facility];  Service: Orthopedics;  Laterality: Left;  . TRANSFORAMINAL LUMBAR INTERBODY FUSION (TLIF) WITH PEDICLE SCREW FIXATION 2 LEVEL N/A 11/07/2015   Procedure: Lumbar three-four Lumbar four-five  transforaminal lumbar interbody fusion with  interbody prosthesis posterior lateral arthrodesis and posterior segmental instrumentation;  Surgeon: Newman Pies, MD;  Location: [facility];  Service: Neurosurgery;  Laterality: N/A;    Family History  Problem Relation Age of Onset  . Hypertension Other   . Cancer Other        ovarian  . Cancer Mother   . Heart disease Father     Social History   Socioeconomic History  . Marital status: Divorced    Spouse name: Not on file  . Number of children: 4  . Years of education: Not on file  . Highest education level: Associate degree: occupational, Hotel manager, or vocational program  Occupational History  . Not on file  Social Needs  . Financial resource strain: Not very hard  . Food insecurity:    Worry: Never true    Inability: Never true  . Transportation needs:    Medical: Yes    Non-medical: Yes  Tobacco Use  . Smoking status: Never Smoker  . Smokeless tobacco: Never Used  Substance and Sexual Activity  . Alcohol use: No    Comment: rarely  . Drug use: No  . Sexual activity: Not Currently  Lifestyle  . Physical activity:    Days per week: 0 days    Minutes per session: 0 min  . Stress: To some extent  Relationships  . Social connections:    Talks on phone: Not on file    Gets together: Not on file    Attends religious service: Never    Active member of club or organization: No    Attends meetings of clubs or organizations: Never    Relationship status: Divorced  . Intimate partner violence:    Fear of current or ex partner: No    Emotionally abused: No    Physically abused: No    Forced sexual activity: No  Other Topics Concern  . Not on file  Social History Narrative   Has a living will.   Son is health care POA   Would accept resuscitation.  Would not want prolonged life support if futile.   Probably would not want tube feeds if cognitively unaware   Review of Systems Appetite is fine Weight about the same Sleeps okay---- nocturia x 2-3 but can  get back to sleep Takes afternoon nap at times Bowels are okay--some urgency    Objective:   Physical Exam  Constitutional: She appears well-developed. No distress.  Respiratory: Effort normal. No respiratory distress.  Psychiatric: She has a normal mood and affect. Her behavior is normal.           Assessment & Plan:

## 2018-12-30 NOTE — Assessment & Plan Note (Signed)
Getting by with the 6 tramadol a day Reviewed PDMP--no concerns

## 2018-12-30 NOTE — Assessment & Plan Note (Signed)
Chronic dysthymia mostly related to pain Duloxetine 30 bid is helping---will continue (may be helping radiculopathy as well)

## 2018-12-30 NOTE — Assessment & Plan Note (Signed)
Still seems to have excellent control Mild neuropathy--hard to tell if from diabetes or her radiculopathy Will set up labs soon

## 2018-12-31 NOTE — Progress Notes (Signed)
Labs 4/27 cpx 10/26 Pt aware

## 2018-12-31 NOTE — Progress Notes (Signed)
Left message asking pt to call office  °

## 2019-01-03 ENCOUNTER — Other Ambulatory Visit: Payer: Self-pay

## 2019-01-03 ENCOUNTER — Other Ambulatory Visit (INDEPENDENT_AMBULATORY_CARE_PROVIDER_SITE_OTHER): Payer: Medicare Other

## 2019-01-03 DIAGNOSIS — E1140 Type 2 diabetes mellitus with diabetic neuropathy, unspecified: Secondary | ICD-10-CM

## 2019-01-03 LAB — COMPREHENSIVE METABOLIC PANEL
ALT: 13 U/L (ref 0–35)
AST: 17 U/L (ref 0–37)
Albumin: 4.1 g/dL (ref 3.5–5.2)
Alkaline Phosphatase: 51 U/L (ref 39–117)
BUN: 29 mg/dL — ABNORMAL HIGH (ref 6–23)
CO2: 30 mEq/L (ref 19–32)
Calcium: 9.3 mg/dL (ref 8.4–10.5)
Chloride: 99 mEq/L (ref 96–112)
Creatinine, Ser: 1.02 mg/dL (ref 0.40–1.20)
GFR: 53.47 mL/min — ABNORMAL LOW (ref >60.00)
Glucose, Bld: 127 mg/dL — ABNORMAL HIGH (ref 70–99)
Potassium: 3.9 mEq/L (ref 3.5–5.1)
Sodium: 138 mEq/L (ref 135–145)
Total Bilirubin: 0.5 mg/dL (ref 0.2–1.2)
Total Protein: 7.2 g/dL (ref 6.0–8.3)

## 2019-01-03 LAB — CBC
HCT: 45.6 % (ref 36.0–46.0)
Hemoglobin: 15.3 g/dL — ABNORMAL HIGH (ref 12.0–15.0)
MCHC: 33.6 g/dL (ref 30.0–36.0)
MCV: 97.3 fl (ref 78.0–100.0)
Platelets: 304 10*3/uL (ref 150.0–400.0)
RBC: 4.68 Mil/uL (ref 3.87–5.11)
RDW: 13.3 % (ref 11.5–15.5)
WBC: 9.6 10*3/uL (ref 4.0–10.5)

## 2019-01-03 LAB — LIPID PANEL
Cholesterol: 194 mg/dL (ref 0–200)
HDL: 70.4 mg/dL (ref >39.00)
LDL Cholesterol: 101 mg/dL — ABNORMAL HIGH (ref 0–99)
NonHDL: 123.1
Total CHOL/HDL Ratio: 3
Triglycerides: 112 mg/dL (ref 0.0–149.0)
VLDL: 22.4 mg/dL (ref 0.0–40.0)

## 2019-01-03 LAB — HEMOGLOBIN A1C: Hgb A1c MFr Bld: 6.5 % (ref 4.6–6.5)

## 2019-01-03 LAB — T4, FREE: Free T4: 0.8 ng/dL (ref 0.60–1.60)

## 2019-01-22 ENCOUNTER — Other Ambulatory Visit: Payer: Self-pay | Admitting: Internal Medicine

## 2019-01-24 NOTE — Telephone Encounter (Signed)
Last filled 12-19-18 #180 Last OV 12-30-18 Next OV 07-04-19 [facility]  Forward to [facility] in Dr Alla German absence

## 2019-02-03 DIAGNOSIS — H905 Unspecified sensorineural hearing loss: Secondary | ICD-10-CM | POA: Diagnosis not present

## 2019-02-19 ENCOUNTER — Other Ambulatory Visit: Payer: Self-pay | Admitting: Internal Medicine

## 2019-02-21 NOTE — Telephone Encounter (Signed)
Last filled 01-22-19 #180 Last OV 12-30-18 Next OV 07-04-19 [facility]

## 2019-03-04 DIAGNOSIS — D692 Other nonthrombocytopenic purpura: Secondary | ICD-10-CM | POA: Diagnosis not present

## 2019-03-04 DIAGNOSIS — L720 Epidermal cyst: Secondary | ICD-10-CM | POA: Diagnosis not present

## 2019-03-04 DIAGNOSIS — D225 Melanocytic nevi of trunk: Secondary | ICD-10-CM | POA: Diagnosis not present

## 2019-03-04 DIAGNOSIS — L738 Other specified follicular disorders: Secondary | ICD-10-CM | POA: Diagnosis not present

## 2019-03-25 ENCOUNTER — Other Ambulatory Visit: Payer: Self-pay | Admitting: Internal Medicine

## 2019-03-28 ENCOUNTER — Telehealth: Payer: Self-pay

## 2019-03-28 NOTE — Telephone Encounter (Signed)
Please refer to my chart message string regarding refill conversation.  R/x approved this am at 830 by Dr. Silvio Pate.  Patient is aware.

## 2019-03-28 NOTE — Telephone Encounter (Signed)
Last filled 02-21-19 #180 Last OV 12-30-18 Next OV 07-04-19 [facility]

## 2019-03-28 NOTE — Telephone Encounter (Signed)
[place] Night - Client TELEPHONE ADVICE RECORD [facility] Patient Name: [name] Gender: Female DOB: September 14, 1945 Age: 73 Y 10 M 23 D Return Phone Number: [phone] (Primary) Address: City/State/Zip: [name] [address] Client [facility] Night - Client Client Site [place] Physician Viviana Simpler - MD Contact Type Call Who Is Calling Patient / Member / Family / Caregiver Call Type Triage / Clinical Relationship To Patient Self Return Phone Number [phone] (Primary) Chief Complaint Back Pain - General Reason for Call Medication Question / Request Initial Comment Caller states she has ran out of tramadol. She has lower back pain that she takes this for and she needs some. Translation No Nurse Assessment Guidelines Guideline Title Affirmed Question Affirmed Notes Nurse Date/Time (Eastern Time) Disp. Time Eilene Ghazi Time) Disposition Final User 03/27/2019 2:36:44 PM Attempt made - message left Berneda Rose, RN, Anderson Malta 03/27/2019 2:47:53 PM FINAL ATTEMPT MADE - message left Yes Berneda Rose, RN, Anderson Malta

## 2019-03-28 NOTE — Telephone Encounter (Signed)
[place] Night - Client TELEPHONE ADVICE RECORD [facility] Patient Name: [name] Gender: Female DOB: May 22, 1946 Age: 73 Y 10 M 23 D Return Phone Number: [phone] (Primary) Address: City/State/Zip: [name]  [address] Client [facility] Night - Client Client Site [place] Physician Viviana Simpler - MD Contact Type Call Who Is Calling Patient / Member / Family / Caregiver Call Type Triage / Clinical Relationship To Patient Self Return Phone Number [phone] (Primary) Chief Complaint Back Pain - General Reason for Call Symptomatic / Request for [place] states she missed a call from the nurse. Caller states she is having back pain and is out of Tramadol. Translation No Nurse Assessment Nurse: Freddie Apley, RN, Malachi Paradise Date/Time (Eastern Time): 03/27/2019 3:32:28 PM Confirm and document reason for call. If symptomatic, describe symptoms. ---Caller states that she needs tramadol. Cannot get a prescription filled. Has the patient had close contact with a person known or suspected to have the novel coronavirus illness OR traveled / lives in area with major community spread (including international travel) in the last 14 days from the onset of symptoms? * If Asymptomatic, screen for exposure and travel within the last 14 days. ---No Does the patient have any new or worsening symptoms? ---No Please document clinical information provided and list any resource used. ---patient just wants a refill. Nurse: Freddie Apley RN, Malachi Paradise Date/Time (Eastern Time): 03/27/2019 3:34:54 PM Please select the assessment type ---Refill Additional Documentation ---Patient wants a refill of her tramadol Does the patient have enough medication to last until the office opens? ---No Additional Documentation ---Pharmacy and provider stated that they could not do a loaner dose nor write a  perscrption. The patient will have to wait until Monday to get perscription filled. Guidelines Guideline Title Affirmed Question Affirmed Notes Nurse Date/Time (Eastern Time) Disp. Time Eilene Ghazi Time) Disposition Final User 03/27/2019 3:44:27 PM Called On-Call Provider Freddie Apley, RN, Malachi Paradise PLEASE NOTE: All timestamps contained within this report are represented as [place] Standard Time. CONFIDENTIALTY NOTICE: This fax transmission is intended only for the addressee. It contains information that is legally privileged, confidential or otherwise protected from use or disclosure. If you are not the intended recipient, you are strictly prohibited from reviewing, disclosing, copying using or disseminating any of this information or taking any action in reliance on or regarding this information. If you have received this fax in error, please notify us immediately by telephone so that we can arrange for its return to [place]. Phone: [phone], Toll-Free: [phone], Fax: [phone] Page: 2 of 2 Call Id: [id number] [place]. Time Eilene Ghazi Time) Disposition Final User 03/27/2019 3:46:14 PM Pharmacy Call Freddie Apley, RN, Malachi Paradise Reason: pharmacy denied request 03/27/2019 3:46:23 PM Clinical Call Yes Freddie Apley, RN, [facility] Phone DateTime Result/Outcome Message Type Notes Garret Reddish- MD [phone] 03/27/2019 3:44:27 PM Called On Call Provider - Reached Doctor Paged Garret Reddish- MD 03/27/2019 3:45:58 PM Spoke with On Call - General Message Result Provider stated that the patient would have to wait until Monday to get perscription because he doesn't fill narcotics.

## 2019-03-28 NOTE — Telephone Encounter (Signed)
See below, r/x addressed this am at 830.  Patient has been made aware.

## 2019-04-13 DIAGNOSIS — E1140 Type 2 diabetes mellitus with diabetic neuropathy, unspecified: Secondary | ICD-10-CM

## 2019-04-13 NOTE — Telephone Encounter (Signed)
Please let her know that I put in the order Set her up to come in for fingerstick A1c

## 2019-04-15 ENCOUNTER — Other Ambulatory Visit: Payer: Self-pay

## 2019-04-15 ENCOUNTER — Other Ambulatory Visit: Payer: Medicare Other

## 2019-04-20 ENCOUNTER — Other Ambulatory Visit: Payer: Self-pay

## 2019-04-20 ENCOUNTER — Ambulatory Visit (INDEPENDENT_AMBULATORY_CARE_PROVIDER_SITE_OTHER): Payer: Medicare Other | Admitting: Orthopaedic Surgery

## 2019-04-20 ENCOUNTER — Encounter: Payer: Self-pay | Admitting: Orthopaedic Surgery

## 2019-04-20 ENCOUNTER — Ambulatory Visit: Payer: Self-pay

## 2019-04-20 DIAGNOSIS — Z96642 Presence of left artificial hip joint: Secondary | ICD-10-CM

## 2019-04-20 DIAGNOSIS — M7062 Trochanteric bursitis, left hip: Secondary | ICD-10-CM

## 2019-04-20 NOTE — Progress Notes (Signed)
The patient is now 15 months status post a left total hip arthroplasty.  She is an active 73 year old female.  She still ambulates with a walker.  She still complains of trochanteric pain but denies any groin pain on the left side.  On exam she has fluid range of motion of her left hip that is pain-free.  She is only tender to palpation of the trochanteric area.  An AP pelvis shows a well-seated total hip arthroplasty on the left side with no complicating features.  This point I recommended outpatient physical therapy to treat her trochanteric bursitis as well as topical Voltaren gel that can be obtained over-the-counter.  She has had steroid injections before and states that it does not help.  All question concerns were answered addressed.  At this point follow-up can be as needed.

## 2019-04-21 ENCOUNTER — Other Ambulatory Visit (INDEPENDENT_AMBULATORY_CARE_PROVIDER_SITE_OTHER): Payer: Medicare Other

## 2019-04-21 DIAGNOSIS — E1140 Type 2 diabetes mellitus with diabetic neuropathy, unspecified: Secondary | ICD-10-CM

## 2019-04-21 LAB — POCT GLYCOSYLATED HEMOGLOBIN (HGB A1C): Hemoglobin A1C: 6.3 % — AB (ref 4.0–5.6)

## 2019-04-26 ENCOUNTER — Other Ambulatory Visit: Payer: Self-pay | Admitting: Internal Medicine

## 2019-04-26 NOTE — Telephone Encounter (Signed)
Last filled 03-28-19 #180 Last OV 12-30-18 Next OV 07-04-19 [facility]

## 2019-04-27 DIAGNOSIS — R2689 Other abnormalities of gait and mobility: Secondary | ICD-10-CM | POA: Diagnosis not present

## 2019-04-27 DIAGNOSIS — M7062 Trochanteric bursitis, left hip: Secondary | ICD-10-CM | POA: Diagnosis not present

## 2019-04-27 DIAGNOSIS — M25552 Pain in left hip: Secondary | ICD-10-CM | POA: Diagnosis not present

## 2019-05-02 DIAGNOSIS — R2689 Other abnormalities of gait and mobility: Secondary | ICD-10-CM | POA: Diagnosis not present

## 2019-05-02 DIAGNOSIS — M7062 Trochanteric bursitis, left hip: Secondary | ICD-10-CM | POA: Diagnosis not present

## 2019-05-02 DIAGNOSIS — M25552 Pain in left hip: Secondary | ICD-10-CM | POA: Diagnosis not present

## 2019-05-04 DIAGNOSIS — M7062 Trochanteric bursitis, left hip: Secondary | ICD-10-CM | POA: Diagnosis not present

## 2019-05-04 DIAGNOSIS — M25552 Pain in left hip: Secondary | ICD-10-CM | POA: Diagnosis not present

## 2019-05-04 DIAGNOSIS — R2689 Other abnormalities of gait and mobility: Secondary | ICD-10-CM | POA: Diagnosis not present

## 2019-05-09 DIAGNOSIS — R2689 Other abnormalities of gait and mobility: Secondary | ICD-10-CM | POA: Diagnosis not present

## 2019-05-09 DIAGNOSIS — M25552 Pain in left hip: Secondary | ICD-10-CM | POA: Diagnosis not present

## 2019-05-09 DIAGNOSIS — M7062 Trochanteric bursitis, left hip: Secondary | ICD-10-CM | POA: Diagnosis not present

## 2019-05-11 DIAGNOSIS — M7062 Trochanteric bursitis, left hip: Secondary | ICD-10-CM | POA: Diagnosis not present

## 2019-05-11 DIAGNOSIS — R2689 Other abnormalities of gait and mobility: Secondary | ICD-10-CM | POA: Diagnosis not present

## 2019-05-11 DIAGNOSIS — M25552 Pain in left hip: Secondary | ICD-10-CM | POA: Diagnosis not present

## 2019-05-13 DIAGNOSIS — R2689 Other abnormalities of gait and mobility: Secondary | ICD-10-CM | POA: Diagnosis not present

## 2019-05-13 DIAGNOSIS — M7062 Trochanteric bursitis, left hip: Secondary | ICD-10-CM | POA: Diagnosis not present

## 2019-05-13 DIAGNOSIS — M25552 Pain in left hip: Secondary | ICD-10-CM | POA: Diagnosis not present

## 2019-05-16 ENCOUNTER — Encounter: Payer: Self-pay | Admitting: Orthopaedic Surgery

## 2019-05-20 DIAGNOSIS — M25552 Pain in left hip: Secondary | ICD-10-CM | POA: Diagnosis not present

## 2019-05-20 DIAGNOSIS — M7062 Trochanteric bursitis, left hip: Secondary | ICD-10-CM | POA: Diagnosis not present

## 2019-05-20 DIAGNOSIS — R2689 Other abnormalities of gait and mobility: Secondary | ICD-10-CM | POA: Diagnosis not present

## 2019-05-23 ENCOUNTER — Telehealth: Payer: Self-pay

## 2019-05-23 ENCOUNTER — Ambulatory Visit (INDEPENDENT_AMBULATORY_CARE_PROVIDER_SITE_OTHER): Payer: Medicare Other | Admitting: Family Medicine

## 2019-05-23 ENCOUNTER — Other Ambulatory Visit: Payer: Self-pay

## 2019-05-23 ENCOUNTER — Encounter: Payer: Self-pay | Admitting: Family Medicine

## 2019-05-23 VITALS — BP 130/90 | HR 102 | Temp 99.2°F | Ht 62.0 in | Wt 183.5 lb

## 2019-05-23 DIAGNOSIS — R2689 Other abnormalities of gait and mobility: Secondary | ICD-10-CM | POA: Diagnosis not present

## 2019-05-23 DIAGNOSIS — M7062 Trochanteric bursitis, left hip: Secondary | ICD-10-CM | POA: Diagnosis not present

## 2019-05-23 DIAGNOSIS — L989 Disorder of the skin and subcutaneous tissue, unspecified: Secondary | ICD-10-CM

## 2019-05-23 DIAGNOSIS — L089 Local infection of the skin and subcutaneous tissue, unspecified: Secondary | ICD-10-CM | POA: Diagnosis not present

## 2019-05-23 DIAGNOSIS — M25552 Pain in left hip: Secondary | ICD-10-CM | POA: Diagnosis not present

## 2019-05-23 MED ORDER — SULFAMETHOXAZOLE-TRIMETHOPRIM 800-160 MG PO TABS: 2.0000 | ORAL_TABLET | Freq: Two times a day (BID) | ORAL | 0 refills | Status: DC

## 2019-05-23 NOTE — Progress Notes (Signed)
Kang Ishida T. Jakyrie Totherow, MD Primary Care and [facility] [place], [address] Phone: [phone]  FAX: [place] - 73 y.o. female  MRN [id number]  Date of Birth: 1946-07-10  Visit Date: 05/23/2019  PCP: Venia Carbon, MD  Referred by: Venia Carbon, MD  Chief Complaint  Patient presents with  . Sore on Left Hip   Subjective:   [name] is a 73 y.o. very pleasant female patient who presents with the following:  Sore on left hip: She placed 2 different topical medications on her left lateral hip.  Subsequently she developed some skin breakdown and now there is some redness and warmth in the area.  There is minimal tenderness but the patient does have some underlying neuropathy.  Since Thursday.  Thought that maybe the medication that she put on her hip.  Not hurt. No warmth.  No warmth.    Past Medical History, Surgical History, Social History, Family History, Problem List, Medications, and Allergies have been reviewed and updated if relevant.  Patient Active Problem List   Diagnosis Date Noted  . Osteoarthritis of right knee 11/16/2018  . Urge incontinence 10/07/2018  . Mycotic toenails 10/07/2018  . Venous insufficiency 10/07/2018  . Unilateral primary osteoarthritis, left hip 01/08/2018  . Status post total replacement of left hip 01/08/2018  . Left hip pain 12/09/2017  . Pain syndrome, chronic 03/13/2017  . Facet arthropathy, lumbar 03/13/2017  . Advance directive discussed with patient 01/15/2016  . Spinal stenosis of lumbar region with neurogenic claudication 11/07/2015  . Abnormal EKG 11/05/2015  . Other intervertebral disc degeneration, lumbar region 06/01/2015  . Chronic back pain 03/29/2015  . Preventative health care 01/10/2015  . Osteoarthritis, hip, bilateral 11/28/2014  . Peripheral neuropathy 11/28/2014  . Type 2 diabetes, controlled, with neuropathy  ([place]) 01/20/2014  . Episodic mood disorder ([place]) 01/20/2014  . IBS (irritable bowel syndrome) 12/27/2012  . Essential hypertension, benign 08/06/2010    Past Medical History:  Diagnosis Date  . Arthritis   . Confusion   . Diabetes mellitus without complication ([place])    type 2  . Hepatitis    pt. denies  . Hypertension   . Lumbar stenosis   . Memory loss   . Nocturia   . Numbness and tingling    legs and feet  . Wears glasses     Past Surgical History:  Procedure Laterality Date  . [place]  . COLONOSCOPY    . HEMORRHOID SURGERY  2015  . JOINT REPLACEMENT     01-08-18 Dr. Kathrynn Speed  . TOTAL HIP ARTHROPLASTY Left 01/08/2018   Procedure: LEFT TOTAL HIP ARTHROPLASTY ANTERIOR APPROACH;  Surgeon: Mcarthur Rossetti, MD;  Location: [facility];  Service: Orthopedics;  Laterality: Left;  . TRANSFORAMINAL LUMBAR INTERBODY FUSION (TLIF) WITH PEDICLE SCREW FIXATION 2 LEVEL N/A 11/07/2015   Procedure: Lumbar three-four Lumbar four-five  transforaminal lumbar interbody fusion with interbody prosthesis posterior lateral arthrodesis and posterior segmental instrumentation;  Surgeon: Newman Pies, MD;  Location: [place] [facility];  Service: Neurosurgery;  Laterality: N/A;    Social History   Socioeconomic History  . Marital status: Divorced    Spouse name: Not on file  . Number of children: 4  . Years of education: Not on file  . Highest education level: Associate degree: occupational, Hotel manager, or vocational program  Occupational History  . Not on file  Social Needs  .  Financial resource strain: Not very hard  . Food insecurity    Worry: Never true    Inability: Never true  . Transportation needs    Medical: Yes    Non-medical: Yes  Tobacco Use  . Smoking status: Never Smoker  . Smokeless tobacco: Never Used  Substance and Sexual Activity  . Alcohol use: No    Comment: rarely  . Drug use: No  . Sexual activity: Not Currently  Lifestyle  . Physical activity     Days per week: 0 days    Minutes per session: 0 min  . Stress: To some extent  Relationships  . Social Herbalist on phone: Not on file    Gets together: Not on file    Attends religious service: Never    Active member of club or organization: No    Attends meetings of clubs or organizations: Never    Relationship status: Divorced  . Intimate partner violence    Fear of current or ex partner: No    Emotionally abused: No    Physically abused: No    Forced sexual activity: No  Other Topics Concern  . Not on file  Social History Narrative   Has a living will.   Son is health care POA   Would accept resuscitation.  Would not want prolonged life support if futile.   Probably would not want tube feeds if cognitively unaware    Family History  Problem Relation Age of Onset  . Hypertension Other   . Cancer Other        ovarian  . Cancer Mother   . Heart disease Father     No Active Allergies  Medication list reviewed and updated in full in [place].   GEN: No acute illnesses, no fevers, chills. GI: No n/v/d, eating normally Pulm: No SOB Interactive and getting along well at home.  Otherwise, ROS is as per the HPI.  Objective:   BP 130/90   Pulse (!) 102   Temp 99.2 F (37.3 C) (Temporal)   Ht _0  (1.575 m)   Wt 183 lb 8 oz (83.2 kg)   SpO2 97%   BMI 33.56 kg/m   GEN: WDWN, NAD, Non-toxic, A & O x 3 HEENT: Atraumatic, Normocephalic. Neck supple. No masses, No LAD. Ears and Nose: No external deformity. CV: RRR, No M/G/R. No JVD. No thrill. No extra heart sounds. PULM: CTA B, no wheezes, crackles, rhonchi. No retractions. No resp. distress. No accessory muscle use. EXTR: No c/c/e NEURO Normal gait.  PSYCH: Normally interactive. Conversant. Not depressed or anxious appearing.  Calm demeanor.   SKIN: There is in an area approximately 4 cm across with some central granulation with some redness and warmth around this.  Laboratory and Imaging  Data:  Assessment and Plan:     ICD-10-CM   1. Skin infection  L08.9   2. Skin abnormality  L98.9    Treat skin infection, neosporin, bandaid, f/u if needed  Follow-up: No follow-ups on file.  Meds ordered this encounter  Medications  . sulfamethoxazole-trimethoprim (BACTRIM DS) 800-160 MG tablet    Sig: Take 2 tablets by mouth 2 (two) times daily.    Dispense:  40 tablet    Refill:  0   No orders of the defined types were placed in this encounter.   Signed,  Maud Deed. Flem Enderle, MD   Outpatient Encounter Medications as of 05/23/2019  Medication Sig  . ACCU-CHEK AVIVA PLUS  test strip USE TO TEST BLOOD SUGAR ONCE DAILY DX: R73.03  . ACCU-CHEK SOFTCLIX LANCETS lancets USE AS INSTRUCTED  . acetaminophen (TYLENOL) 500 MG tablet Take 500 mg by mouth every 4 (four) hours as needed.  . Blood Glucose Monitoring Suppl (ACCU-CHEK AVIVA PLUS) w/Device KIT Use to test blood sugar once daily Dx: R73.03  . Chromium Picolinate 1000 MCG TABS Take 1,000 mcg by mouth daily.   . Coenzyme Q10 (CO Q 10) 100 MG CAPS Take 100 mg by mouth daily.   . Digestive Enzymes CAPS Take 2 capsules by mouth.   . DULoxetine (CYMBALTA) 60 MG capsule TAKE 1 CAPSULE (60 MG TOTAL) BY MOUTH 2 (TWO) TIMES DAILY.  Marland Kitchen gabapentin (NEURONTIN) 300 MG capsule Take 2 capsules by mouth daily and 3 capsules by mouth at bedtime.  Marland Kitchen lisinopril-hydrochlorothiazide (PRINZIDE,ZESTORETIC) 20-25 MG tablet TAKE 2 TABLETS BY MOUTH DAILY  . meloxicam (MOBIC) 15 MG tablet Take 1 tablet (15 mg total) by mouth daily.  . metFORMIN (GLUCOPHAGE-XR) 500 MG 24 hr tablet TAKE 1 TABLET (500 MG TOTAL) BY MOUTH DAILY WITH BREAKFAST.  . Multiple Vitamins-Minerals (MULTIVITAMIN WITH MINERALS) tablet Take 1 tablet by mouth daily.   . Omega-3 Fatty Acids (FISH OIL) 1000 MG CAPS Take 1,800 mg by mouth.   Edythe Lynn Lecithin 1200 MG CAPS Take 1,200 mg by mouth 3 (three) times daily.   . vitamin C (ASCORBIC ACID) 500 MG tablet Take 500 mg by mouth daily.  .  [DISCONTINUED] traMADol (ULTRAM) 50 MG tablet TAKE 2 TABLETS (100 MG TOTAL) BY MOUTH 3 (THREE) TIMES DAILY AS NEEDED.  Marland Kitchen sulfamethoxazole-trimethoprim (BACTRIM DS) 800-160 MG tablet Take 2 tablets by mouth 2 (two) times daily.  . [DISCONTINUED] CANNABIDIOL PO Take by mouth.  . [DISCONTINUED] gabapentin (NEURONTIN) 300 MG capsule TAKE TWO CAPSULES BY MOUTH TWICE A DAY TAKE 3 CAPSULES AT BEDTIME  . [DISCONTINUED] Probiotic Product ([facility] SENIOR PROBIOTIC) CAPS Take 1 capsule by mouth daily.   No facility-administered encounter medications on file as of 05/23/2019.

## 2019-05-23 NOTE — Telephone Encounter (Signed)
Pt already has appt with Dr Lorelei Pont 05/23/19 at 10:40.

## 2019-05-23 NOTE — Telephone Encounter (Signed)
[place] Night - Client TELEPHONE ADVICE RECORD [facility] Patient Name: TYRIHANNA JULSON Gender: Female DOB: 1945/10/07 Age: 73 Y 17 D Return Phone Number: [phone] (Primary) Address: City/State/Zip: [place] [address] Client [place] [facility] Night - Client Client Site [place] Physician Viviana Simpler - MD Contact Type Call Who Is Calling Patient / Member / Family / Caregiver Call Type Triage / Clinical Relationship To Patient Self Return Phone Number [phone] (Primary) Chief Complaint Wound Infection Reason for Call Symptomatic / Request for [place] states that she has a wound on her leg that is inflamed and painful. Translation No Nurse Assessment Nurse: Ysidro Evert, RN, Levada Dy Date/Time (Eastern Time): 05/21/2019 8:20:11 AM Confirm and document reason for call. If symptomatic, describe symptoms. ---Caller states she has a wound near her left hip that is a big circle. She isn't sure what caused it. It is about an inch and there is spreading redness around it. No fever Has the patient had close contact with a person known or suspected to have the novel coronavirus illness OR traveled / lives in area with major community spread (including international travel) in the last 14 days from the onset of symptoms? * If Asymptomatic, screen for exposure and travel within the last 14 days. ---No Does the patient have any new or worsening symptoms? ---Yes Will a triage be completed? ---Yes Related visit to physician within the last 2 weeks? ---No Does the PT have any chronic conditions? (i.e. diabetes, asthma, this includes High risk factors for pregnancy, etc.) ---Yes List chronic conditions. ---diabetes, hypertension Is this a behavioral health or substance abuse call? ---No Guidelines Guideline Title Affirmed Question Affirmed Notes Nurse Date/Time  (Eastern Time) Wound Infection [1] Red area or streak AND [2] no fever Earleen Reaper 05/21/2019 8:23:43 AM Disp. Time Eilene Ghazi Time) Disposition Final User 05/21/2019 8:30:22 AM See PCP within 24 Hours Yes Ysidro Evert, RN, Levada Dy PLEASE NOTE: All timestamps contained within this report are represented as [place] Standard Time. CONFIDENTIALTY NOTICE: This fax transmission is intended only for the addressee. It contains information that is legally privileged, confidential or otherwise protected from use or disclosure. If you are not the intended recipient, you are strictly prohibited from reviewing, disclosing, copying using or disseminating any of this information or taking any action in reliance on or regarding this information. If you have received this fax in error, please notify us immediately by telephone so that we can arrange for its return to [place]. Phone: [phone], Toll-Free: [phone], Fax: [phone] Page: 2 of 2 Call Id: [id number] [place] Disagree/Comply Comply Caller Understands Yes PreDisposition Did not know what to do Care Advice Given Per Guideline SEE PCP WITHIN 24 HOURS: * IF OFFICE WILL BE CLOSED AND NO PCP (PRIMARY CARE PROVIDER) SECONDLEVEL TRIAGE: You need to be seen within the next 24 hours. A clinic or an urgent care center is often a good source of care if your doctor's office is closed or you can't get an appointment. CALL BACK IF: * Fever occurs * You become worse. CARE ADVICE per Wound Infection (Adult) guideline. Referrals [facility]

## 2019-05-23 NOTE — Patient Instructions (Signed)
Use NEOSPORIN on this through the week as this heals, can cover with a band-aid

## 2019-05-24 ENCOUNTER — Other Ambulatory Visit: Payer: Self-pay | Admitting: Internal Medicine

## 2019-05-24 NOTE — Telephone Encounter (Signed)
Last filled 04-26-19 #180 Last OV 05-23-19 Acute Next OV 07-04-19 [facility] [place]

## 2019-05-25 DIAGNOSIS — M7062 Trochanteric bursitis, left hip: Secondary | ICD-10-CM | POA: Diagnosis not present

## 2019-05-25 DIAGNOSIS — M25552 Pain in left hip: Secondary | ICD-10-CM | POA: Diagnosis not present

## 2019-05-25 DIAGNOSIS — R2689 Other abnormalities of gait and mobility: Secondary | ICD-10-CM | POA: Diagnosis not present

## 2019-05-26 ENCOUNTER — Encounter: Payer: Self-pay | Admitting: Family Medicine

## 2019-05-30 DIAGNOSIS — M7062 Trochanteric bursitis, left hip: Secondary | ICD-10-CM | POA: Diagnosis not present

## 2019-05-30 DIAGNOSIS — M25552 Pain in left hip: Secondary | ICD-10-CM | POA: Diagnosis not present

## 2019-05-30 DIAGNOSIS — R2689 Other abnormalities of gait and mobility: Secondary | ICD-10-CM | POA: Diagnosis not present

## 2019-06-03 ENCOUNTER — Telehealth: Payer: Self-pay | Admitting: Cardiovascular Disease

## 2019-06-03 DIAGNOSIS — R2689 Other abnormalities of gait and mobility: Secondary | ICD-10-CM | POA: Diagnosis not present

## 2019-06-03 DIAGNOSIS — M7062 Trochanteric bursitis, left hip: Secondary | ICD-10-CM | POA: Diagnosis not present

## 2019-06-03 DIAGNOSIS — M25552 Pain in left hip: Secondary | ICD-10-CM | POA: Diagnosis not present

## 2019-06-03 NOTE — Telephone Encounter (Signed)
Patient states she is sleeping a lot and thinks she may be in afib Denies any other symptoms. States her HR is in the 70s .  Patient asks not to call before 4:30

## 2019-06-03 NOTE — Telephone Encounter (Signed)
Left message to call office

## 2019-06-07 NOTE — Telephone Encounter (Signed)
Called patient.  States she is feeling ok and does not think she needs to move her appointment up to sooner. I offered her appointment in about 2 weeks with an APP and she refused saying she will wait until November's appointment to see Dr Rockey Situ. Denies chest pain, dizziness.  Some SOB when going up stairs. She will call back if any new questions or concerns arise.

## 2019-06-10 DIAGNOSIS — R2689 Other abnormalities of gait and mobility: Secondary | ICD-10-CM | POA: Diagnosis not present

## 2019-06-10 DIAGNOSIS — M25552 Pain in left hip: Secondary | ICD-10-CM | POA: Diagnosis not present

## 2019-06-10 DIAGNOSIS — M7062 Trochanteric bursitis, left hip: Secondary | ICD-10-CM | POA: Diagnosis not present

## 2019-06-14 DIAGNOSIS — M7062 Trochanteric bursitis, left hip: Secondary | ICD-10-CM | POA: Diagnosis not present

## 2019-06-14 DIAGNOSIS — M25552 Pain in left hip: Secondary | ICD-10-CM | POA: Diagnosis not present

## 2019-06-14 DIAGNOSIS — R2689 Other abnormalities of gait and mobility: Secondary | ICD-10-CM | POA: Diagnosis not present

## 2019-06-16 ENCOUNTER — Other Ambulatory Visit: Payer: Self-pay | Admitting: Internal Medicine

## 2019-06-18 ENCOUNTER — Other Ambulatory Visit: Payer: Self-pay | Admitting: Internal Medicine

## 2019-06-18 DIAGNOSIS — F320 Major depressive disorder, single episode, mild: Secondary | ICD-10-CM

## 2019-06-21 DIAGNOSIS — M25552 Pain in left hip: Secondary | ICD-10-CM | POA: Diagnosis not present

## 2019-06-21 DIAGNOSIS — M7062 Trochanteric bursitis, left hip: Secondary | ICD-10-CM | POA: Diagnosis not present

## 2019-06-21 DIAGNOSIS — R2689 Other abnormalities of gait and mobility: Secondary | ICD-10-CM | POA: Diagnosis not present

## 2019-06-24 DIAGNOSIS — R2689 Other abnormalities of gait and mobility: Secondary | ICD-10-CM | POA: Diagnosis not present

## 2019-06-24 DIAGNOSIS — M25552 Pain in left hip: Secondary | ICD-10-CM | POA: Diagnosis not present

## 2019-06-24 DIAGNOSIS — M7062 Trochanteric bursitis, left hip: Secondary | ICD-10-CM | POA: Diagnosis not present

## 2019-06-28 DIAGNOSIS — M7062 Trochanteric bursitis, left hip: Secondary | ICD-10-CM | POA: Diagnosis not present

## 2019-06-28 DIAGNOSIS — M25552 Pain in left hip: Secondary | ICD-10-CM | POA: Diagnosis not present

## 2019-06-28 DIAGNOSIS — R2689 Other abnormalities of gait and mobility: Secondary | ICD-10-CM | POA: Diagnosis not present

## 2019-07-01 DIAGNOSIS — M7062 Trochanteric bursitis, left hip: Secondary | ICD-10-CM | POA: Diagnosis not present

## 2019-07-01 DIAGNOSIS — R2689 Other abnormalities of gait and mobility: Secondary | ICD-10-CM | POA: Diagnosis not present

## 2019-07-01 DIAGNOSIS — M25552 Pain in left hip: Secondary | ICD-10-CM | POA: Diagnosis not present

## 2019-07-04 ENCOUNTER — Ambulatory Visit (INDEPENDENT_AMBULATORY_CARE_PROVIDER_SITE_OTHER): Payer: Medicare Other | Admitting: Internal Medicine

## 2019-07-04 ENCOUNTER — Encounter: Payer: Self-pay | Admitting: Internal Medicine

## 2019-07-04 ENCOUNTER — Other Ambulatory Visit: Payer: Self-pay

## 2019-07-04 VITALS — BP 128/80 | HR 84 | Temp 97.0°F | Ht 61.0 in | Wt 180.0 lb

## 2019-07-04 DIAGNOSIS — M48062 Spinal stenosis, lumbar region with neurogenic claudication: Secondary | ICD-10-CM | POA: Diagnosis not present

## 2019-07-04 DIAGNOSIS — F1120 Opioid dependence, uncomplicated: Secondary | ICD-10-CM | POA: Diagnosis not present

## 2019-07-04 DIAGNOSIS — Z7189 Other specified counseling: Secondary | ICD-10-CM

## 2019-07-04 DIAGNOSIS — F39 Unspecified mood [affective] disorder: Secondary | ICD-10-CM

## 2019-07-04 DIAGNOSIS — I10 Essential (primary) hypertension: Secondary | ICD-10-CM

## 2019-07-04 DIAGNOSIS — Z0000 Encounter for general adult medical examination without abnormal findings: Secondary | ICD-10-CM

## 2019-07-04 DIAGNOSIS — E1140 Type 2 diabetes mellitus with diabetic neuropathy, unspecified: Secondary | ICD-10-CM | POA: Diagnosis not present

## 2019-07-04 LAB — HM DIABETES FOOT EXAM

## 2019-07-04 NOTE — Assessment & Plan Note (Signed)
Tramadol up to 6 per day PDMP reviewed

## 2019-07-04 NOTE — Progress Notes (Signed)
Subjective:    Patient ID: [name], female    DOB: 24-Mar-1946, 73 y.o.   MRN: [id number]  HPI Here for Medicare wellness visit and follow up of chronic health conditions Reviewed form and advanced directives Reviewed other doctors No alcohol or tobacco Tries to exercise fairly regularly Vision is okay Uses hearing aides--they help No falls Chronic mood issues Independent with instrumental ADLs Some mild memory issues--mostly recall  Doing okay Biggest issue is pain on her left side From buttock down lateral leg  Trouble even standing up Walks with rolling walker Shops in store and does her cooking, cleaning, etc Still on the gabapentin (612m AM, 900 PM) and duloxetine Also uses the meloxicam in the morning Uses the tramadol 1021mtid mostly (but skips nights when she can)  Checks sugars once a day usually 97-125 generally No low sugar reactions Did have eye exam ---likely before COVID Sensation changes in both legs at times  No chest pain No palpitations Some dizziness but no syncope (like when standing up quick) No sig edema No headaches No cough. Stable DOE going up steps  Depression persists---"I am very lonely---and my son doesn't come to see me" Not regularly anhedonic  Current Outpatient Medications on File Prior to Visit  Medication Sig Dispense Refill  . ACCU-CHEK AVIVA PLUS test strip USE TO TEST BLOOD SUGAR ONCE DAILY DX: R73.03 100 each 3  . Accu-Chek Softclix Lancets lancets USE AS INSTRUCTED Dx Code R73.3 100 each 3  . acetaminophen (TYLENOL) 500 MG tablet Take 500 mg by mouth every 4 (four) hours as needed.    . Blood Glucose Monitoring Suppl (ACCU-CHEK AVIVA PLUS) w/Device KIT Use to test blood sugar once daily Dx: R73.03 1 kit 0  . Chromium Picolinate 1000 MCG TABS Take 1,000 mcg by mouth daily.     . Coenzyme Q10 (CO Q 10) 100 MG CAPS Take 100 mg by mouth daily.     . Digestive Enzymes CAPS Take 2 capsules by mouth.     . DULoxetine  (CYMBALTA) 60 MG capsule TAKE 1 CAPSULE (60 MG TOTAL) BY MOUTH 2 (TWO) TIMES DAILY. 180 capsule 0  . gabapentin (NEURONTIN) 300 MG capsule Take 2 capsules by mouth daily and 3 capsules by mouth at bedtime.    . Marland Kitchenisinopril-hydrochlorothiazide (PRINZIDE,ZESTORETIC) 20-25 MG tablet TAKE 2 TABLETS BY MOUTH DAILY 180 tablet 3  . meloxicam (MOBIC) 15 MG tablet Take 1 tablet (15 mg total) by mouth daily. 90 tablet 3  . metFORMIN (GLUCOPHAGE-XR) 500 MG 24 hr tablet TAKE 1 TABLET BY MOUTH EVERY DAY WITH BREAKFAST 90 tablet 0  . Multiple Vitamins-Minerals (MULTIVITAMIN WITH MINERALS) tablet Take 1 tablet by mouth daily.     . Omega-3 Fatty Acids (FISH OIL) 1000 MG CAPS Take 1,800 mg by mouth.     . Edythe Lynnecithin 1200 MG CAPS Take 1,200 mg by mouth 3 (three) times daily.     . traMADol (ULTRAM) 50 MG tablet TAKE 2 TABLETS (100 MG TOTAL) BY MOUTH 3 (THREE) TIMES DAILY AS NEEDED. 180 tablet 0  . vitamin C (ASCORBIC ACID) 500 MG tablet Take 500 mg by mouth daily.     No current facility-administered medications on file prior to visit.     No Active Allergies  Past Medical History:  Diagnosis Date  . Arthritis   . Confusion   . Diabetes mellitus without complication ([place]   type 2  . Hepatitis    pt. denies  . Hypertension   .  Lumbar stenosis   . Memory loss   . Nocturia   . Numbness and tingling    legs and feet  . Wears glasses     Past Surgical History:  Procedure Laterality Date  . [place]  . COLONOSCOPY    . HEMORRHOID SURGERY  2015  . JOINT REPLACEMENT     01-08-18 Dr. Kathrynn Speed  . TOTAL HIP ARTHROPLASTY Left 01/08/2018   Procedure: LEFT TOTAL HIP ARTHROPLASTY ANTERIOR APPROACH;  Surgeon: Mcarthur Rossetti, MD;  [facility]: [facility];  Service: Orthopedics;  Laterality: Left;  . TRANSFORAMINAL LUMBAR INTERBODY FUSION (TLIF) WITH PEDICLE SCREW FIXATION 2 LEVEL N/A 11/07/2015   Procedure: Lumbar three-four Lumbar four-five  transforaminal lumbar interbody fusion with  interbody prosthesis posterior lateral arthrodesis and posterior segmental instrumentation;  Surgeon: Newman Pies, MD;  Location: [place] [facility];  Service: Neurosurgery;  Laterality: N/A;    Family History  Problem Relation Age of Onset  . Hypertension Other   . Cancer Other        ovarian  . Cancer Mother   . Heart disease Father     Social History   Socioeconomic History  . Marital status: Divorced    Spouse name: Not on file  . Number of children: 4  . Years of education: Not on file  . Highest education level: Associate degree: occupational, Hotel manager, or vocational program  Occupational History  . Not on file  Social Needs  . Financial resource strain: Not very hard  . Food insecurity    Worry: Never true    Inability: Never true  . Transportation needs    Medical: Yes    Non-medical: Yes  Tobacco Use  . Smoking status: Never Smoker  . Smokeless tobacco: Never Used  Substance and Sexual Activity  . Alcohol use: No    Comment: rarely  . Drug use: No  . Sexual activity: Not Currently  Lifestyle  . Physical activity    Days per week: 0 days    Minutes per session: 0 min  . Stress: To some extent  Relationships  . Social Herbalist on phone: Not on file    Gets together: Not on file    Attends religious service: Never    Active member of club or organization: No    Attends meetings of clubs or organizations: Never    Relationship status: Divorced  . Intimate partner violence    Fear of current or ex partner: No    Emotionally abused: No    Physically abused: No    Forced sexual activity: No  Other Topics Concern  . Not on file  Social History Narrative   Has a living will.   Son is health care POA   Would accept resuscitation.  Would not want prolonged life support if futile.   Probably would not want tube feeds if cognitively unaware   Review of Systems Appetite is okay Weight is fairly stable Sleeps okay Doesn't wear seat belt---"it  is a nuisance"---counseled  Teeth okay--keeps up with dentist Has cyst on back---no other suspicious spots No heartburn or dysphagia Bowels are moving okay--no blood No dysuria. Mild urge incontinence--actually better    Objective:   Physical Exam  Constitutional: She is oriented to person, place, and time. She appears well-developed. No distress.  HENT:  Mouth/Throat: Oropharynx is clear and moist. No oropharyngeal exudate.  No oral lesions  Neck: No thyromegaly present.  Cardiovascular: Normal rate, regular rhythm and  normal heart sounds. Exam reveals no gallop.  No murmur heard. Feet cool but with faint pulses  Respiratory: Effort normal and breath sounds normal. No respiratory distress. She has no wheezes. She has no rales.  GI: Soft. There is no abdominal tenderness.  Musculoskeletal:        General: No tenderness.     Comments: +/- slight edema  Lymphadenopathy:    She has no cervical adenopathy.  Neurological: She is alert and oriented to person, place, and time.  President--- "Dwaine Deter, [place]" [phone] D-[id number] Recall 3/3  Mild decreased sensation in feet  Skin: No rash noted. No erythema.  Psychiatric: She has a normal mood and affect. Her behavior is normal.           Assessment & Plan:

## 2019-07-04 NOTE — Assessment & Plan Note (Signed)
BP Readings from Last 3 Encounters:  07/04/19 128/80  05/23/19 130/90  11/16/18 122/80   Good control

## 2019-07-04 NOTE — Assessment & Plan Note (Signed)
Stable pain and function

## 2019-07-04 NOTE — Assessment & Plan Note (Signed)
Lab Results  Component Value Date   HGBA1C 6.3 (A) 04/21/2019   Good control Will recheck at next visit

## 2019-07-04 NOTE — Assessment & Plan Note (Signed)
See social history 

## 2019-07-04 NOTE — Assessment & Plan Note (Signed)
I have personally reviewed the Medicare Annual Wellness questionnaire and have noted 1. The patient's medical and social history 2. Their use of alcohol, tobacco or illicit drugs 3. Their current medications and supplements 4. The patient's functional ability including ADL's, fall risks, home safety risks and hearing or visual             impairment. 5. Diet and physical activities 6. Evidence for depression or mood disorders  The patients weight, height, BMI and visual acuity have been recorded in the chart I have made referrals, counseling and provided education to the patient based review of the above and I have provided the pt with a written personalized care plan for preventive services.  I have provided you with a copy of your personalized plan for preventive services. Please take the time to review along with your updated medication list.  Had flu vaccine Colon due 12/21 Still prefers no mammograms Discussed exercise

## 2019-07-04 NOTE — Assessment & Plan Note (Signed)
Chronic dysthymia Some anxiety Stable on the duloxetine (for neurogenic pain as well)

## 2019-07-05 DIAGNOSIS — R2689 Other abnormalities of gait and mobility: Secondary | ICD-10-CM | POA: Diagnosis not present

## 2019-07-05 DIAGNOSIS — M7062 Trochanteric bursitis, left hip: Secondary | ICD-10-CM | POA: Diagnosis not present

## 2019-07-05 DIAGNOSIS — M25552 Pain in left hip: Secondary | ICD-10-CM | POA: Diagnosis not present

## 2019-07-05 NOTE — Telephone Encounter (Signed)
noted 

## 2019-07-05 NOTE — Telephone Encounter (Signed)
I updated the Vitamin D and the Fish Oil

## 2019-07-06 ENCOUNTER — Other Ambulatory Visit: Payer: Self-pay | Admitting: Internal Medicine

## 2019-07-06 NOTE — Telephone Encounter (Signed)
Last filled 05-24-19 #180 Last OV 07-04-19 No Future OV [facility] [place]

## 2019-07-08 DIAGNOSIS — M7062 Trochanteric bursitis, left hip: Secondary | ICD-10-CM | POA: Diagnosis not present

## 2019-07-08 DIAGNOSIS — M25552 Pain in left hip: Secondary | ICD-10-CM | POA: Diagnosis not present

## 2019-07-08 DIAGNOSIS — R2689 Other abnormalities of gait and mobility: Secondary | ICD-10-CM | POA: Diagnosis not present

## 2019-07-12 DIAGNOSIS — R2689 Other abnormalities of gait and mobility: Secondary | ICD-10-CM | POA: Diagnosis not present

## 2019-07-12 DIAGNOSIS — M7062 Trochanteric bursitis, left hip: Secondary | ICD-10-CM | POA: Diagnosis not present

## 2019-07-12 DIAGNOSIS — M25552 Pain in left hip: Secondary | ICD-10-CM | POA: Diagnosis not present

## 2019-07-19 DIAGNOSIS — R2689 Other abnormalities of gait and mobility: Secondary | ICD-10-CM | POA: Diagnosis not present

## 2019-07-19 DIAGNOSIS — M7062 Trochanteric bursitis, left hip: Secondary | ICD-10-CM | POA: Diagnosis not present

## 2019-07-19 DIAGNOSIS — M25552 Pain in left hip: Secondary | ICD-10-CM | POA: Diagnosis not present

## 2019-07-22 DIAGNOSIS — R2689 Other abnormalities of gait and mobility: Secondary | ICD-10-CM | POA: Diagnosis not present

## 2019-07-22 DIAGNOSIS — M25552 Pain in left hip: Secondary | ICD-10-CM | POA: Diagnosis not present

## 2019-07-22 DIAGNOSIS — M7062 Trochanteric bursitis, left hip: Secondary | ICD-10-CM | POA: Diagnosis not present

## 2019-07-23 NOTE — Progress Notes (Deleted)
Cardiology Office Note  Date:  07/23/2019   ID:  [name] [birth year], MRN [id number]  PCP:  Venia Carbon, MD   No chief complaint on file.   HPI:  [name] is a 73 year old woman with history of  chronic back pain, back surgery in 2017 ,  glucose intolerance/diabetes type 2,  hypertension,  previously seen for abnormal EKG and preop clearance,  who presents for routine follow-up of her abnormal EKG, hypertension  In follow-up today she reports that her legs are weak Sedentary, no regular exercise Lives alone, not much help from her children Relying on a neighbor to help her do chores around the house  Denies any chest pain, shortness of breath Weight continues to run high On last clinic visit she had dropped 30 pounds in weight but this has since stabilized Back pain, SI joint pain Pain with walking  Lab work reviewed with her HBA1C 5.8  EKG personally reviewed by myself on todays visit Shows normal sinus rhythm/sinus tachycardia rate 106 bpm right bundle branch block, left anterior fascicular block   other past medical history reviewed  in 2017 reported fluttering  in her chest lasting for 10 seconds or so, happens several times per year otherwise feels well most of the time.  Denies any smoking history   PMH:   has a past medical history of Arthritis, Confusion, Diabetes mellitus without complication ([place]), Hepatitis, Hypertension, Lumbar stenosis, Memory loss, Nocturia, Numbness and tingling, and Wears glasses.  PSH:    Past Surgical History:  Procedure Laterality Date  . [place]  . COLONOSCOPY    . HEMORRHOID SURGERY  2015  . JOINT REPLACEMENT     01-08-18 Dr. Kathrynn Speed  . TOTAL HIP ARTHROPLASTY Left 01/08/2018   Procedure: LEFT TOTAL HIP ARTHROPLASTY ANTERIOR APPROACH;  Surgeon: Mcarthur Rossetti, MD;  Location: [facility];  Service: Orthopedics;  Laterality: Left;  . TRANSFORAMINAL LUMBAR INTERBODY FUSION (TLIF) WITH  PEDICLE SCREW FIXATION 2 LEVEL N/A 11/07/2015   Procedure: Lumbar three-four Lumbar four-five  transforaminal lumbar interbody fusion with interbody prosthesis posterior lateral arthrodesis and posterior segmental instrumentation;  Surgeon: Newman Pies, MD;  Location: [place] [facility];  Service: Neurosurgery;  Laterality: N/A;    Current Outpatient Medications  Medication Sig Dispense Refill  . ACCU-CHEK AVIVA PLUS test strip USE TO TEST BLOOD SUGAR ONCE DAILY DX: R73.03 100 each 3  . Accu-Chek Softclix Lancets lancets USE AS INSTRUCTED Dx Code R73.3 100 each 3  . acetaminophen (TYLENOL) 500 MG tablet Take 500 mg by mouth every 4 (four) hours as needed.    . Blood Glucose Monitoring Suppl (ACCU-CHEK AVIVA PLUS) w/Device KIT Use to test blood sugar once daily Dx: R73.03 1 kit 0  . Cholecalciferol (VITAMIN D3) 25 MCG (1000 UT) CAPS Take by mouth.    . Chromium Picolinate 1000 MCG TABS Take 1,000 mcg by mouth daily.     . Coenzyme Q10 (CO Q 10) 100 MG CAPS Take 100 mg by mouth daily.     . Digestive Enzymes CAPS Take 2 capsules by mouth.     . DULoxetine (CYMBALTA) 60 MG capsule TAKE 1 CAPSULE (60 MG TOTAL) BY MOUTH 2 (TWO) TIMES DAILY. 180 capsule 0  . gabapentin (NEURONTIN) 300 MG capsule Take 2 capsules by mouth daily and 3 capsules by mouth at bedtime.    Marland Kitchen lisinopril-hydrochlorothiazide (PRINZIDE,ZESTORETIC) 20-25 MG tablet TAKE 2 TABLETS BY MOUTH DAILY 180 tablet 3  . meloxicam (MOBIC) 15  MG tablet Take 1 tablet (15 mg total) by mouth daily. 90 tablet 3  . metFORMIN (GLUCOPHAGE-XR) 500 MG 24 hr tablet TAKE 1 TABLET BY MOUTH EVERY DAY WITH BREAKFAST 90 tablet 0  . Multiple Vitamins-Minerals (MULTIVITAMIN WITH MINERALS) tablet Take 1 tablet by mouth daily.     . Omega-3 Fatty Acids (FISH OIL) 1000 MG CAPS Take 2,000 mg by mouth.    Edythe Lynn Lecithin 1200 MG CAPS Take 1,200 mg by mouth 3 (three) times daily.     . traMADol (ULTRAM) 50 MG tablet TAKE 2 TABLETS (100 MG TOTAL) BY MOUTH 3 (THREE) TIMES  DAILY AS NEEDED. 180 tablet 0  . vitamin C (ASCORBIC ACID) 500 MG tablet Take 500 mg by mouth daily.     No current facility-administered medications for this visit.      Allergies:   Patient has no active allergies.   Social History:  The patient  reports that she has never smoked. She has never used smokeless tobacco. She reports that she does not drink alcohol or use drugs.   Family History:   family history includes Cancer in her mother and another family member; Heart disease in her father; Hypertension in an other family member.    Review of Systems: Review of Systems  Constitutional: Negative.   Respiratory: Negative.   Cardiovascular: Negative.   Gastrointestinal: Negative.   Musculoskeletal: Positive for back pain.  Neurological: Negative.   Psychiatric/Behavioral: Negative.   All other systems reviewed and are negative.    PHYSICAL EXAM: VS:  There were no vitals taken for this visit. , BMI There is no height or weight on file to calculate BMI. Constitutional:  oriented to person, place, and time. No distress.  HENT:  Head: Grossly normal Eyes:  no discharge. No scleral icterus.  Neck: No JVD, no carotid bruits  Cardiovascular: Regular rate and rhythm, no murmurs appreciated Pulmonary/Chest: Clear to auscultation bilaterally, no wheezes or rails Abdominal: Soft.  no distension.  no tenderness.  Musculoskeletal: Normal range of motion Neurological:  normal muscle tone. Coordination normal. No atrophy Skin: Skin warm and dry Psychiatric: normal affect, pleasant  Recent Labs: 01/03/2019: ALT 13; BUN 29; Creatinine, Ser 1.02; Hemoglobin 15.3; Platelets 304.0; Potassium 3.9; Sodium 138    Lipid Panel Lab Results  Component Value Date   CHOL 194 01/03/2019   HDL 70.40 01/03/2019   LDLCALC 101 (H) 01/03/2019   TRIG 112.0 01/03/2019      Wt Readings from Last 3 Encounters:  07/04/19 180 lb (81.6 kg)  05/23/19 183 lb 8 oz (83.2 kg)  11/16/18 186 lb (84.4 kg)        ASSESSMENT AND PLAN:  Essential hypertension, benign - Plan: EKG 12-Lead Blood pressure elevated on today's visit, she attributes this to walking rapidly into the office.  Recommended she closely monitor blood pressure at home and call our office with numbers  Type 2 diabetes, controlled, with neuropathy ([place]) - Plan: EKG 12-Lead We have encouraged continued careful diet management in an effort to lose weight. Unable to exercise secondary to chronic pain  Chronic bilateral low back pain without sciatica Chronic issue, limiting her ADLs around the house  Abnormal EKG Right bundle branch block, left anterior fascicular block No significant change in EKG in the past year No further work-up at this time   Total encounter time more than 25 minutes  Greater than 50% was spent in counseling and coordination of care with the patient  Disposition:   F/U as  needed She will call us with blood pressure measurements  No orders of the defined types were placed in this encounter.    Signed, [name], M.D., Ph.D. 07/23/2019  [place], [place]

## 2019-07-25 ENCOUNTER — Ambulatory Visit: Payer: Medicare Other | Admitting: Cardiovascular Disease

## 2019-07-25 ENCOUNTER — Encounter: Payer: Self-pay | Admitting: Internal Medicine

## 2019-07-25 ENCOUNTER — Other Ambulatory Visit: Payer: Self-pay

## 2019-07-25 ENCOUNTER — Ambulatory Visit (INDEPENDENT_AMBULATORY_CARE_PROVIDER_SITE_OTHER): Payer: Medicare Other | Admitting: Internal Medicine

## 2019-07-25 VITALS — BP 118/78 | HR 87 | Temp 97.3°F | Ht 61.0 in | Wt 180.0 lb

## 2019-07-25 DIAGNOSIS — M778 Other enthesopathies, not elsewhere classified: Secondary | ICD-10-CM | POA: Insufficient documentation

## 2019-07-25 DIAGNOSIS — E1140 Type 2 diabetes mellitus with diabetic neuropathy, unspecified: Secondary | ICD-10-CM

## 2019-07-25 DIAGNOSIS — M7062 Trochanteric bursitis, left hip: Secondary | ICD-10-CM | POA: Diagnosis not present

## 2019-07-25 DIAGNOSIS — M25552 Pain in left hip: Secondary | ICD-10-CM | POA: Diagnosis not present

## 2019-07-25 DIAGNOSIS — R2689 Other abnormalities of gait and mobility: Secondary | ICD-10-CM | POA: Diagnosis not present

## 2019-07-25 LAB — POCT GLYCOSYLATED HEMOGLOBIN (HGB A1C): Hemoglobin A1C: 6.2 % — AB (ref 4.0–5.6)

## 2019-07-25 NOTE — Assessment & Plan Note (Signed)
Cheating a little more Will recheck A1c

## 2019-07-25 NOTE — Assessment & Plan Note (Signed)
Deltoid or triceps Discussed lifting in front of her body---don't reach back Ice if worsens acutely Ortho if worsens

## 2019-07-25 NOTE — Progress Notes (Signed)
Subjective:    Patient ID: [name], female    DOB: 1946-05-15, 73 y.o.   MRN: [id number]  HPI Here due to left shoulder pain  Started with pain about a week ago Then got "really bad" 3 days ago----if in a certain position At PT 3 days ago when she was there Some decreased active abduction--due to pain Trouble moving it posteriorly as well  She is "lifting and reaching" all the time She also fell against the wall before this started---- balance issue  Hurts at the top of the acromion Continues on meloxicam daily--also gabapentin Tried heat Also continues on the tramadol  Current Outpatient Medications on File Prior to Visit  Medication Sig Dispense Refill  . ACCU-CHEK AVIVA PLUS test strip USE TO TEST BLOOD SUGAR ONCE DAILY DX: R73.03 100 each 3  . Accu-Chek Softclix Lancets lancets USE AS INSTRUCTED Dx Code R73.3 100 each 3  . acetaminophen (TYLENOL) 500 MG tablet Take 500 mg by mouth every 4 (four) hours as needed.    . Blood Glucose Monitoring Suppl (ACCU-CHEK AVIVA PLUS) w/Device KIT Use to test blood sugar once daily Dx: R73.03 1 kit 0  . Cholecalciferol (VITAMIN D3) 25 MCG (1000 UT) CAPS Take by mouth.    . Chromium Picolinate 1000 MCG TABS Take 1,000 mcg by mouth daily.     . Coenzyme Q10 (CO Q 10) 100 MG CAPS Take 100 mg by mouth daily.     . Digestive Enzymes CAPS Take 2 capsules by mouth.     . DULoxetine (CYMBALTA) 60 MG capsule TAKE 1 CAPSULE (60 MG TOTAL) BY MOUTH 2 (TWO) TIMES DAILY. 180 capsule 0  . gabapentin (NEURONTIN) 300 MG capsule Take 2 capsules by mouth daily and 3 capsules by mouth at bedtime.    Marland Kitchen lisinopril-hydrochlorothiazide (PRINZIDE,ZESTORETIC) 20-25 MG tablet TAKE 2 TABLETS BY MOUTH DAILY 180 tablet 3  . meloxicam (MOBIC) 15 MG tablet Take 1 tablet (15 mg total) by mouth daily. 90 tablet 3  . metFORMIN (GLUCOPHAGE-XR) 500 MG 24 hr tablet TAKE 1 TABLET BY MOUTH EVERY DAY WITH BREAKFAST 90 tablet 0  . Multiple Vitamins-Minerals (MULTIVITAMIN  WITH MINERALS) tablet Take 1 tablet by mouth daily.     . Omega-3 Fatty Acids (FISH OIL) 1000 MG CAPS Take 2,000 mg by mouth.    [name] 1200 MG CAPS Take 1,200 mg by mouth 3 (three) times daily.     . traMADol (ULTRAM) 50 MG tablet TAKE 2 TABLETS (100 MG TOTAL) BY MOUTH 3 (THREE) TIMES DAILY AS NEEDED. 180 tablet 0  . vitamin C (ASCORBIC ACID) 500 MG tablet Take 500 mg by mouth daily.     No current facility-administered medications on file prior to visit.     No Active Allergies  Past Medical History:  Diagnosis Date  . Arthritis   . Confusion   . Diabetes mellitus without complication ([place])    type 2  . Hepatitis    pt. denies  . Hypertension   . Lumbar stenosis   . Memory loss   . Nocturia   . Numbness and tingling    legs and feet  . Wears glasses     Past Surgical History:  Procedure Laterality Date  . [place]  . COLONOSCOPY    . HEMORRHOID SURGERY  2015  . JOINT REPLACEMENT     01-08-18 Dr. Kathrynn Speed  . TOTAL HIP ARTHROPLASTY Left 01/08/2018   Procedure: LEFT TOTAL HIP ARTHROPLASTY ANTERIOR  APPROACH;  Surgeon: Mcarthur Rossetti, MD;  Location: [facility];  Service: Orthopedics;  Laterality: Left;  . TRANSFORAMINAL LUMBAR INTERBODY FUSION (TLIF) WITH PEDICLE SCREW FIXATION 2 LEVEL N/A 11/07/2015   Procedure: Lumbar three-four Lumbar four-five  transforaminal lumbar interbody fusion with interbody prosthesis posterior lateral arthrodesis and posterior segmental instrumentation;  Surgeon: Newman Pies, MD;  Location: [place];  Service: Neurosurgery;  Laterality: N/A;    Family History  Problem Relation Age of Onset  . Hypertension Other   . Cancer Other        ovarian  . Cancer Mother   . Heart disease Father     Social History   Socioeconomic History  . Marital status: Divorced    Spouse name: Not on file  . Number of children: 4  . Years of education: Not on file  . Highest education level: Associate degree: occupational,  Hotel manager, or vocational program  Occupational History  . Not on file  Social Needs  . Financial resource strain: Not very hard  . Food insecurity    Worry: Never true    Inability: Never true  . Transportation needs    Medical: Yes    Non-medical: Yes  Tobacco Use  . Smoking status: Never Smoker  . Smokeless tobacco: Never Used  Substance and Sexual Activity  . Alcohol use: No    Comment: rarely  . Drug use: No  . Sexual activity: Not Currently  Lifestyle  . Physical activity    Days per week: 0 days    Minutes per session: 0 min  . Stress: To some extent  Relationships  . Social Herbalist on phone: Not on file    Gets together: Not on file    Attends religious service: Never    Active member of club or organization: No    Attends meetings of clubs or organizations: Never    Relationship status: Divorced  . Intimate partner violence    Fear of current or ex partner: No    Emotionally abused: No    Physically abused: No    Forced sexual activity: No  Other Topics Concern  . Not on file  Social History Narrative   Has a living will.   Son is health care POA   Would accept resuscitation.  Would not want prolonged life support if futile.   Probably would not want tube feeds if cognitively unaware   Review of Systems  No fever No cough or illness     Objective:   Physical Exam  Constitutional: She appears well-developed. No distress.  Musculoskeletal:     Comments: Mild tenderness over triceps tendon on left No swelling Abduction actively to almost 90 degrees and full passively Normal internal/external rotation passively No bursa tenderness           Assessment & Plan:

## 2019-07-27 ENCOUNTER — Telehealth: Payer: Self-pay | Admitting: Cardiovascular Disease

## 2019-07-27 NOTE — Telephone Encounter (Signed)
Patient calling to schedule appt with another provider named Dr. Ubaldo Glassing due to convenience of access to building.    She is changing providers .  Deleting recall per request.

## 2019-08-01 DIAGNOSIS — I452 Bifascicular block: Secondary | ICD-10-CM | POA: Diagnosis not present

## 2019-08-01 DIAGNOSIS — I10 Essential (primary) hypertension: Secondary | ICD-10-CM | POA: Diagnosis not present

## 2019-08-01 DIAGNOSIS — E1140 Type 2 diabetes mellitus with diabetic neuropathy, unspecified: Secondary | ICD-10-CM | POA: Diagnosis not present

## 2019-08-01 DIAGNOSIS — R9431 Abnormal electrocardiogram [ECG] [EKG]: Secondary | ICD-10-CM | POA: Diagnosis not present

## 2019-08-01 DIAGNOSIS — I872 Venous insufficiency (chronic) (peripheral): Secondary | ICD-10-CM | POA: Diagnosis not present

## 2019-08-02 DIAGNOSIS — R2689 Other abnormalities of gait and mobility: Secondary | ICD-10-CM | POA: Diagnosis not present

## 2019-08-02 DIAGNOSIS — M25552 Pain in left hip: Secondary | ICD-10-CM | POA: Diagnosis not present

## 2019-08-02 DIAGNOSIS — M7062 Trochanteric bursitis, left hip: Secondary | ICD-10-CM | POA: Diagnosis not present

## 2019-08-05 DIAGNOSIS — R2689 Other abnormalities of gait and mobility: Secondary | ICD-10-CM | POA: Diagnosis not present

## 2019-08-05 DIAGNOSIS — M25552 Pain in left hip: Secondary | ICD-10-CM | POA: Diagnosis not present

## 2019-08-05 DIAGNOSIS — M7062 Trochanteric bursitis, left hip: Secondary | ICD-10-CM | POA: Diagnosis not present

## 2019-08-09 ENCOUNTER — Other Ambulatory Visit: Payer: Self-pay | Admitting: Internal Medicine

## 2019-08-09 DIAGNOSIS — M25552 Pain in left hip: Secondary | ICD-10-CM | POA: Diagnosis not present

## 2019-08-09 DIAGNOSIS — M7062 Trochanteric bursitis, left hip: Secondary | ICD-10-CM | POA: Diagnosis not present

## 2019-08-09 DIAGNOSIS — R2689 Other abnormalities of gait and mobility: Secondary | ICD-10-CM | POA: Diagnosis not present

## 2019-08-09 MED ORDER — TRAMADOL HCL 50 MG PO TABS: 100.0000 mg | ORAL_TABLET | Freq: Three times a day (TID) | ORAL | 0 refills | Status: DC | PRN

## 2019-08-09 NOTE — Telephone Encounter (Signed)
Tramadol last written 07-06-19 #180 Last OV 07-25-19 Next OV 12-23-19 [facility]

## 2019-08-17 ENCOUNTER — Other Ambulatory Visit: Payer: Self-pay

## 2019-08-17 ENCOUNTER — Telehealth: Payer: Self-pay | Admitting: *Deleted

## 2019-08-17 ENCOUNTER — Encounter: Payer: Self-pay | Admitting: Emergency Medicine

## 2019-08-17 ENCOUNTER — Emergency Department
Admission: EM | Admit: 2019-08-17 | Discharge: 2019-08-17 | Disposition: A | Discharge status: Home / Self Care | Payer: Medicare Other | Attending: Emergency Medicine | Admitting: Emergency Medicine

## 2019-08-17 DIAGNOSIS — E119 Type 2 diabetes mellitus without complications: Secondary | ICD-10-CM | POA: Diagnosis not present

## 2019-08-17 DIAGNOSIS — M7989 Other specified soft tissue disorders: Secondary | ICD-10-CM | POA: Diagnosis not present

## 2019-08-17 DIAGNOSIS — Z03818 Encounter for observation for suspected exposure to other biological agents ruled out: Secondary | ICD-10-CM | POA: Diagnosis not present

## 2019-08-17 DIAGNOSIS — L02511 Cutaneous abscess of right hand: Secondary | ICD-10-CM | POA: Diagnosis not present

## 2019-08-17 DIAGNOSIS — L539 Erythematous condition, unspecified: Secondary | ICD-10-CM | POA: Diagnosis present

## 2019-08-17 DIAGNOSIS — Z96642 Presence of left artificial hip joint: Secondary | ICD-10-CM | POA: Insufficient documentation

## 2019-08-17 DIAGNOSIS — Z7984 Long term (current) use of oral hypoglycemic drugs: Secondary | ICD-10-CM | POA: Insufficient documentation

## 2019-08-17 DIAGNOSIS — Z20828 Contact with and (suspected) exposure to other viral communicable diseases: Secondary | ICD-10-CM | POA: Insufficient documentation

## 2019-08-17 DIAGNOSIS — L03113 Cellulitis of right upper limb: Secondary | ICD-10-CM | POA: Insufficient documentation

## 2019-08-17 DIAGNOSIS — M79644 Pain in right finger(s): Secondary | ICD-10-CM | POA: Diagnosis present

## 2019-08-17 DIAGNOSIS — Z79899 Other long term (current) drug therapy: Secondary | ICD-10-CM | POA: Diagnosis not present

## 2019-08-17 DIAGNOSIS — I10 Essential (primary) hypertension: Secondary | ICD-10-CM | POA: Diagnosis not present

## 2019-08-17 DIAGNOSIS — Z23 Encounter for immunization: Secondary | ICD-10-CM | POA: Diagnosis not present

## 2019-08-17 DIAGNOSIS — L03011 Cellulitis of right finger: Secondary | ICD-10-CM | POA: Diagnosis not present

## 2019-08-17 LAB — COMPREHENSIVE METABOLIC PANEL
ALT: 18 U/L (ref 0–44)
AST: 21 U/L (ref 15–41)
Albumin: 3.8 g/dL (ref 3.5–5.0)
Alkaline Phosphatase: 47 U/L (ref 38–126)
Anion gap: 11 (ref 5–15)
BUN: 38 mg/dL — ABNORMAL HIGH (ref 8–23)
CO2: 25 mmol/L (ref 22–32)
Calcium: 9 mg/dL (ref 8.9–10.3)
Chloride: 102 mmol/L (ref 98–111)
Creatinine, Ser: 1.35 mg/dL — ABNORMAL HIGH (ref 0.44–1.00)
GFR calc Af Amer: 46 mL/min — ABNORMAL LOW (ref >60)
GFR calc non Af Amer: 39 mL/min — ABNORMAL LOW (ref >60)
Glucose, Bld: 122 mg/dL — ABNORMAL HIGH (ref 70–99)
Potassium: 3.6 mmol/L (ref 3.5–5.1)
Sodium: 138 mmol/L (ref 135–145)
Total Bilirubin: 0.9 mg/dL (ref 0.3–1.2)
Total Protein: 6.8 g/dL (ref 6.5–8.1)

## 2019-08-17 LAB — CBC WITH DIFFERENTIAL/PLATELET
Abs Immature Granulocytes: 0.07 10*3/uL (ref 0.00–0.07)
Basophils Absolute: 0 10*3/uL (ref 0.0–0.1)
Basophils Relative: 0 %
Eosinophils Absolute: 0.1 10*3/uL (ref 0.0–0.5)
Eosinophils Relative: 1 %
HCT: 42.7 % (ref 36.0–46.0)
Hemoglobin: 14.5 g/dL (ref 12.0–15.0)
Immature Granulocytes: 1 %
Lymphocytes Relative: 8 %
Lymphs Abs: 1.2 10*3/uL (ref 0.7–4.0)
MCH: 33.7 pg (ref 26.0–34.0)
MCHC: 34 g/dL (ref 30.0–36.0)
MCV: 99.3 fL (ref 80.0–100.0)
Monocytes Absolute: 1.1 10*3/uL — ABNORMAL HIGH (ref 0.1–1.0)
Monocytes Relative: 8 %
Neutro Abs: 11.8 10*3/uL — ABNORMAL HIGH (ref 1.7–7.7)
Neutrophils Relative %: 82 %
Platelets: 275 10*3/uL (ref 150–400)
RBC: 4.3 MIL/uL (ref 3.87–5.11)
RDW: 12.8 % (ref 11.5–15.5)
WBC: 14.4 10*3/uL — ABNORMAL HIGH (ref 4.0–10.5)
nRBC: 0 % (ref 0.0–0.2)

## 2019-08-17 MED ORDER — TETANUS-DIPHTH-ACELL PERTUSSIS 5-2.5-18.5 LF-MCG/0.5 IM SUSP
0.5000 mL | Freq: Once | INTRAMUSCULAR | Status: AC
  Administered 2019-08-17: 0.5 mL via INTRAMUSCULAR
  Filled 2019-08-17: qty 0.5

## 2019-08-17 MED ORDER — ACETAMINOPHEN 325 MG PO TABS
650.0000 mg | ORAL_TABLET | Freq: Once | ORAL | Status: AC
  Administered 2019-08-17: 650 mg via ORAL
  Filled 2019-08-17: qty 2

## 2019-08-17 MED ORDER — SULFAMETHOXAZOLE-TRIMETHOPRIM 800-160 MG PO TABS: 1.0000 | ORAL_TABLET | Freq: Two times a day (BID) | ORAL | 0 refills | Status: DC

## 2019-08-17 MED ORDER — CLINDAMYCIN PHOSPHATE 600 MG/50ML IV SOLN
600.0000 mg | Freq: Once | INTRAVENOUS | Status: AC
  Administered 2019-08-17: 600 mg via INTRAVENOUS
  Filled 2019-08-17: qty 50

## 2019-08-17 NOTE — ED Triage Notes (Signed)
Pt reports had a small bump on her right hand index finger so she took a needle and poked it and now she thinks she has an infection.

## 2019-08-17 NOTE — Discharge Instructions (Addendum)
Call for follow-up with your primary care provider in 2 days.  If you are not able to see your PCP return to the emergency department for reevaluation of your infection.  Do not stick any needles or continue squeezing your finger to get the infection out.  Use warm compresses to the area.  A splint was applied to protect your finger.  You may take it off to bathe.  Continue your regular medication.  A prescription for infection was sent to your pharmacy.  Begin taking it today twice a day for the next 10 days.  Return to the emergency department sooner if you develop any fever, chills, nausea or vomiting or if your hand begins looking worse.

## 2019-08-17 NOTE — Telephone Encounter (Signed)
Patient left a voicemail stating that she is at the ER and needs to know when she had her last tetanus? Called patient back and was advised that they asked her when she had her last tetanus vaccine and she was not sure. Patient stated that they have gone ahead and given her a tetanus booster. Advised patient that [facility] is on the same system that we are on and they should be able to see her immunization record.  Patient appreciated the call back.Marland Kitchen

## 2019-08-17 NOTE — ED Provider Notes (Signed)
[name] Emergency Department Provider Note  ____________________________________________   First MD Initiated Contact with Patient 08/17/19 [phone]     (approximate)  I have reviewed the triage vital signs and the nursing notes.   HISTORY  Chief Complaint Hand Pain   HPI [name] is a 73 y.o. female 73 year old female presents to the ED with complaint of redness to her right index finger.  Patient states that she had an area that looked like a bump just below her fingernail and she took a needle and stuck it.  She states she expressed some fluid from the area by squeezing on it.  She states that since that time the area has looked worse and now she complains of some swelling and redness.  Patient also does not know the last time she had a tetanus booster.  Patient is diabetic and states that her blood sugars are usually in the 100s.  Patient also is hypertensive and did not take her medication this morning as she only takes it at 9 AM each day.  Rates her pain as an 8 out of 10.     Past Medical History:  Diagnosis Date  . Arthritis   . Confusion   . Diabetes mellitus without complication ([place])    type 2  . Hepatitis    pt. denies  . Hypertension   . Lumbar stenosis   . Memory loss   . Nocturia   . Numbness and tingling    legs and feet  . Wears glasses     Patient Active Problem List   Diagnosis Date Noted  . Shoulder tendonitis, left 07/25/2019  . Chronic narcotic dependence ([place]) 07/04/2019  . Osteoarthritis of right knee 11/16/2018  . Urge incontinence 10/07/2018  . Mycotic toenails 10/07/2018  . Venous insufficiency 10/07/2018  . Unilateral primary osteoarthritis, left hip 01/08/2018  . Status post total replacement of left hip 01/08/2018  . Left hip pain 12/09/2017  . Pain syndrome, chronic 03/13/2017  . Facet arthropathy, lumbar 03/13/2017  . Advance directive discussed with patient 01/15/2016  . Spinal stenosis of lumbar  region with neurogenic claudication 11/07/2015  . Abnormal EKG 11/05/2015  . Other intervertebral disc degeneration, lumbar region 06/01/2015  . Chronic back pain 03/29/2015  . Preventative health care 01/10/2015  . Osteoarthritis, hip, bilateral 11/28/2014  . Peripheral neuropathy 11/28/2014  . Type 2 diabetes, controlled, with neuropathy ([place]) 01/20/2014  . Episodic mood disorder ([place]) 01/20/2014  . IBS (irritable bowel syndrome) 12/27/2012  . Essential hypertension, benign 08/06/2010    Past Surgical History:  Procedure Laterality Date  . [place]  . COLONOSCOPY    . HEMORRHOID SURGERY  2015  . JOINT REPLACEMENT     01-08-18 Dr. Kathrynn Speed  . TOTAL HIP ARTHROPLASTY Left 01/08/2018   Procedure: LEFT TOTAL HIP ARTHROPLASTY ANTERIOR APPROACH;  Surgeon: Mcarthur Rossetti, MD;  Location: [facility] ORS;  Service: Orthopedics;  Laterality: Left;  . TRANSFORAMINAL LUMBAR INTERBODY FUSION (TLIF) WITH PEDICLE SCREW FIXATION 2 LEVEL N/A 11/07/2015   Procedure: Lumbar three-four Lumbar four-five  transforaminal lumbar interbody fusion with interbody prosthesis posterior lateral arthrodesis and posterior segmental instrumentation;  Surgeon: Newman Pies, MD;  Location: [facility];  Service: Neurosurgery;  Laterality: N/A;    Prior to Admission medications   Medication Sig Start Date End Date Taking? Authorizing Provider  ACCU-CHEK AVIVA PLUS test strip USE TO TEST BLOOD SUGAR ONCE DAILY DX: R73.03 10/15/18   Venia Carbon, MD  Accu-Chek Softclix Lancets lancets USE AS INSTRUCTED Dx Code R73.3 06/16/19   Venia Carbon, MD  acetaminophen (TYLENOL) 500 MG tablet Take 500 mg by mouth every 4 (four) hours as needed.    [provider]  Blood Glucose Monitoring Suppl (ACCU-CHEK AVIVA PLUS) w/Device KIT Use to test blood sugar once daily Dx: R73.03 07/10/16   Venia Carbon, MD  Cholecalciferol (VITAMIN D3) 25 MCG (1000 UT) CAPS Take by mouth.    [provider]   Chromium Picolinate 1000 MCG TABS Take 1,000 mcg by mouth daily.     [provider]  Coenzyme Q10 (CO Q 10) 100 MG CAPS Take 100 mg by mouth daily.     [provider]  Digestive Enzymes CAPS Take 2 capsules by mouth.     [provider]  DULoxetine (CYMBALTA) 60 MG capsule TAKE 1 CAPSULE (60 MG TOTAL) BY MOUTH 2 (TWO) TIMES DAILY. 06/22/19   Venia Carbon, MD  gabapentin (NEURONTIN) 300 MG capsule Take 2 capsules by mouth daily and 3 capsules by mouth at bedtime.    [provider]  lisinopril-hydrochlorothiazide (ZESTORETIC) 20-25 MG tablet TAKE 2 TABLETS BY MOUTH EVERY DAY 08/10/19   Venia Carbon, MD  meloxicam (MOBIC) 15 MG tablet Take 1 tablet (15 mg total) by mouth daily. 12/15/18   Venia Carbon, MD  metFORMIN (GLUCOPHAGE-XR) 500 MG 24 hr tablet TAKE 1 TABLET BY MOUTH EVERY DAY WITH BREAKFAST 06/22/19   Venia Carbon, MD  Multiple Vitamins-Minerals (MULTIVITAMIN WITH MINERALS) tablet Take 1 tablet by mouth daily.     [provider]  Omega-3 Fatty Acids (FISH OIL) 1000 MG CAPS Take 2,000 mg by mouth.    [provider]  Soya Lecithin 1200 MG CAPS Take 1,200 mg by mouth 3 (three) times daily.     [provider]  sulfamethoxazole-trimethoprim (BACTRIM DS) 800-160 MG tablet Take 1 tablet by mouth 2 (two) times daily. 08/17/19   Johnn Hai, PA-C  traMADol (ULTRAM) 50 MG tablet Take 2 tablets (100 mg total) by mouth 3 (three) times daily as needed. 08/09/19   Venia Carbon, MD  vitamin C (ASCORBIC ACID) 500 MG tablet Take 500 mg by mouth daily.    [provider]    Allergies Patient has no active allergies.  Family History  Problem Relation Age of Onset  . Hypertension Other   . Cancer Other        ovarian  . Cancer Mother   . Heart disease Father     Social History Social History   Tobacco Use  . Smoking status: Never Smoker  . Smokeless tobacco: Never Used  Substance Use Topics   . Alcohol use: No    Comment: rarely  . Drug use: No    Review of Systems Constitutional: No fever/chills Cardiovascular: Denies chest pain. Respiratory: Denies shortness of breath. Musculoskeletal: Infection to the right index finger. Skin: Redness to right hand. Neurological: Negative for  focal weakness or numbness. ____________________________________________   PHYSICAL EXAM:  VITAL SIGNS: ED Triage Vitals  Enc Vitals Group     BP 08/17/19 0741 (!) 179/112     Pulse Rate 08/17/19 0741 87     Resp 08/17/19 0741 19     Temp 08/17/19 0739 98.2 F (36.8 C)     Temp Source 08/17/19 0739 Oral     SpO2 08/17/19 0741 95 %     Weight 08/17/19 0739 180 lb (81.6 kg)  Height 08/17/19 0739 '5\' 1"'  (1.549 m)     Head Circumference --      Peak Flow --      Pain Score 08/17/19 0739 8     Pain Loc --      Pain Edu? --      Excl. in [place]? --    Constitutional: Alert and oriented. Well appearing and in no acute distress. Eyes: Conjunctivae are normal.  Head: Atraumatic. Neck: No stridor.   Cardiovascular: Normal rate, regular rhythm. Grossly normal heart sounds.  Good peripheral circulation. Respiratory: Normal respiratory effort.  No retractions. Lungs CTAB. Musculoskeletal: Examination of the right hand there is no gross deformity however there is soft tissue edema noted along the right index finger.  Patient is able to flex and extend her index finger with minimal difficulty.  Area is extremely tender to palpation.  Also there is some erythema noted at the MP joint and soft tissue swelling.  Pulses present.  Capillary refill is less than 3 seconds.  No active drainage is noted from what looks to be a resolving paronychia. Neurologic:  Normal speech and language. No gross focal neurologic deficits are appreciated. No gait instability. Skin:  Skin is warm, dry. Psychiatric: Mood and affect are normal. Speech and behavior are normal.  ____________________________________________    LABS (all labs ordered are listed, but only abnormal results are displayed)  Labs Reviewed  CBC WITH DIFFERENTIAL/PLATELET - Abnormal; Notable for the following components:      Result Value   WBC 14.4 (*)    Neutro Abs 11.8 (*)    Monocytes Absolute 1.1 (*)    All other components within normal limits  COMPREHENSIVE METABOLIC PANEL - Abnormal; Notable for the following components:   Glucose, Bld 122 (*)    BUN 38 (*)    Creatinine, Ser 1.35 (*)    GFR calc non Af Amer 39 (*)    GFR calc Af Amer 46 (*)    All other components within normal limits     PROCEDURES  Procedure(s) performed (including Critical Care):  Procedures ____________________________________________   INITIAL IMPRESSION / ASSESSMENT AND PLAN / ED COURSE  As part of my medical decision making, I reviewed the following data within the electronic MEDICAL RECORD NUMBER Notes from prior ED visits and [place] Controlled Substance Database  73 year old female presents to the ED with complaint of her right index finger being red and tender.  Patient states she took a needle and poked an area that looked like it had infection 2 to 3 weeks ago.  Patient has been squeezing fluid from it and now has a bruise along with erythema and warmth to her index finger.  Lab work was reassuring.  Her old records were reviewed and it appears that her BUN has been as high as 35 at her primary care office.  Creatinine is slightly elevated today.  Patient was given clindamycin while in the ED.  She was given a prescription for Bactrim DS twice daily for 10 days.  Patient was encouraged to use warm compresses to the area frequently.  Her finger was placed in a splint for protection.  She is return to the emergency department if she cannot see her primary care for reevaluation.  Patient voices that she will comply with these instructions.  His return to the emergency department sooner if any worsening of her  symptoms.  ____________________________________________   FINAL CLINICAL IMPRESSION(S) / ED DIAGNOSES  Final diagnoses:  Paronychia of right index  finger  Cellulitis of right hand     ED Discharge Orders         Ordered    sulfamethoxazole-trimethoprim (BACTRIM DS) 800-160 MG tablet  2 times daily     08/17/19 3254           Note:  This document was prepared using Dragon voice recognition software and may include unintentional dictation errors.    Johnn Hai, PA-C 08/17/19 1650    Carrie Mew, MD 08/22/19 2000

## 2019-08-17 NOTE — Telephone Encounter (Signed)
She went to the ER.

## 2019-08-17 NOTE — ED Notes (Signed)
+  2 swelling to Right index finger and distal metacarpal.  Bruising noted to distal finger posterior side.  Finger hot to touch.  States she had a 'bump' just under nail to right index finger 2-3 weeks ago, which she stuck a needle into to express fluid.  States swelling and pain has worsened this week and she noticed the bruising this morning.

## 2019-08-17 NOTE — Telephone Encounter (Signed)
Correct The records are there that she had it in 2014 She had a penetrating injury (from a needle she used)---so probably okay that they updated her Will await the full ER evaluation

## 2019-08-17 NOTE — Telephone Encounter (Signed)
[place] Night - Client TELEPHONE ADVICE RECORD [facility] Patient Name: [name] [facility] Gender: Female DOB: Oct 21, 1945 Age: 73 Y 29 M 13 D Return Phone Number: [phone] (Primary) Address: City/State/Zip: [place] [address] Client [facility] Night - Client Client Site [place] Physician Viviana Simpler - MD Contact Type Call Who Is Calling Patient / Member / Family / Caregiver Call Type Triage / Clinical Relationship To Patient Self Return Phone Number [phone] (Primary) Chief Complaint Finger Pain Reason for Call Symptomatic / Request for [place] states has finger pain. Translation No Nurse Assessment Nurse: Manson Allan, RN, Sarah Date/Time Eilene Ghazi Time): 08/17/2019 5:55:04 AM Confirm and document reason for call. If symptomatic, describe symptoms. ---Pt. states has index finger pain and swelling on right hand. Symptoms started yesterday. Has the patient had close contact with a person known or suspected to have the novel coronavirus illness OR traveled / lives in area with major community spread (including international travel) in the last 14 days from the onset of symptoms? * If Asymptomatic, screen for exposure and travel within the last 14 days. ---No Does the patient have any new or worsening symptoms? ---Yes Will a triage be completed? ---Yes Related visit to physician within the last 2 weeks? ---No Does the PT have any chronic conditions? (i.e. diabetes, asthma, this includes High risk factors for pregnancy, etc.) ---Yes List chronic conditions. ---Pre diabetic HTN Is this a behavioral health or substance abuse call? ---No Guidelines Guideline Title Affirmed Question Affirmed Notes Nurse Date/Time (Eastern Time) Wound Infection SEVERE pain in the wound [place], RN, Judson Roch 08/17/2019 5:56:57 AM Disp. Time Eilene Ghazi Time) Disposition Final User 08/17/2019  6:00:09 AM Go to ED Now Yes Manson Allan, RN, Sarah PLEASE NOTE: All timestamps contained within this report are represented as [place] Standard Time. CONFIDENTIALTY NOTICE: This fax transmission is intended only for the addressee. It contains information that is legally privileged, confidential or otherwise protected from use or disclosure. If you are not the intended recipient, you are strictly prohibited from reviewing, disclosing, copying using or disseminating any of this information or taking any action in reliance on or regarding this information. If you have received this fax in error, please notify us immediately by telephone so that we can arrange for its return to [place]. Phone: [phone], Toll-Free: [phone], Fax: [phone] Page: 2 of 2 Call Id: [id number] [place] Disagree/Comply Comply Caller Understands Yes PreDisposition InappropriateToAsk Care Advice Given Per Guideline GO TO ED NOW: * You need to be seen in the Emergency Department. * Leave now. Drive carefully. PAIN MEDICINES: * For pain relief, you can take either acetaminophen, ibuprofen, or naproxen. * They are over-the-counter (OTC) pain drugs. You can buy them at the drugstore. * ACETAMINOPHEN - REGULAR STRENGTH TYLENOL: Take 650 mg (two 325 mg pills) by mouth every 4-6 hours as needed. Each Regular Strength Tylenol pill has 325 mg of acetaminophen. The most you should take each day is 3,250 mg (10 pills a day). BRING MEDICINES: * Please bring a list of your current medicines when you go to the Emergency Department (ER). * It is also a good idea to bring the pill bottles too. This will help the doctor to make certain you are taking the right medicines and the right dose. CARE ADVICE per Wound Infection (Adult) guideline. Comments User: Guido Sander, RN Date/Time Eilene Ghazi Time): 08/17/2019 5:56:25 AM Pt. had a spot on finger and stuck a needle in it and  got out some stuff. Referrals [facility] - ED

## 2019-08-18 ENCOUNTER — Emergency Department (HOSPITAL_COMMUNITY): Payer: Medicare Other

## 2019-08-18 ENCOUNTER — Other Ambulatory Visit: Payer: Self-pay

## 2019-08-18 ENCOUNTER — Emergency Department (HOSPITAL_COMMUNITY)
Admission: EM | Admit: 2019-08-18 | Discharge: 2019-08-18 | Disposition: A | Discharge status: Home / Self Care | Payer: Medicare Other | Attending: Emergency Medicine | Admitting: Emergency Medicine

## 2019-08-18 ENCOUNTER — Encounter (HOSPITAL_COMMUNITY): Payer: Self-pay | Admitting: Emergency Medicine

## 2019-08-18 DIAGNOSIS — M7989 Other specified soft tissue disorders: Secondary | ICD-10-CM | POA: Diagnosis not present

## 2019-08-18 DIAGNOSIS — L02519 Cutaneous abscess of unspecified hand: Secondary | ICD-10-CM

## 2019-08-18 LAB — SARS CORONAVIRUS 2 (TAT 6-24 HRS): SARS Coronavirus 2: NEGATIVE

## 2019-08-18 MED ORDER — VANCOMYCIN HCL IN DEXTROSE 1-5 GM/200ML-% IV SOLN
1000.0000 mg | Freq: Once | INTRAVENOUS | Status: AC
  Administered 2019-08-18: 1000 mg via INTRAVENOUS
  Filled 2019-08-18: qty 200

## 2019-08-18 NOTE — ED Triage Notes (Signed)
Pt reports had a small bump on her right hand index finger so she took a needle and poked it and now she thinks she has an infection.

## 2019-08-18 NOTE — ED Provider Notes (Signed)
[facility] DEPT Provider Note: Georgena Spurling, MD, FACEP  CSN: [id number] MRN: [id number] ARRIVAL: 08/17/19 at [place]: [place]  Finger Pain   HISTORY OF PRESENT ILLNESS  08/18/19 12:47 AM [name] LAURENCIA ROMA is a 73 y.o. female who complains of pain, erythema and swelling of her right hand.  This began as a small pustule proximal to her right index fingernail.  She attempted to I&D it herself with a needle but despite this the infection is spread and is now involving the entire finger and parts of the hand.  She rates associated pain is a 6 out of 10, worse with palpation or movement.  She has very limited movement of the index finger in flexion or extension.  She was seen at [facility] yesterday.  The note reads "Examination of the right hand there is no gross deformity however there is soft tissue edema noted along the right index finger.  Patient is able to flex and extend her index finger with minimal difficulty.  Area is extremely tender to palpation.  Also there is some erythema noted at the MP joint and soft tissue swelling.  Pulses present. Capillary refill is less than 3 seconds.  No active drainage is noted from what looks to be a resolving paronychia."   Past Medical History:  Diagnosis Date  . Arthritis   . Confusion   . Diabetes mellitus without complication ([place])    type 2  . Hepatitis    pt. denies  . Hypertension   . Lumbar stenosis   . Memory loss   . Nocturia   . Numbness and tingling    legs and feet  . Wears glasses     Past Surgical History:  Procedure Laterality Date  . [place]  . COLONOSCOPY    . HEMORRHOID SURGERY  2015  . JOINT REPLACEMENT     01-08-18 Dr. Kathrynn Speed  . TOTAL HIP ARTHROPLASTY Left 01/08/2018   Procedure: LEFT TOTAL HIP ARTHROPLASTY ANTERIOR APPROACH;  Surgeon: Mcarthur Rossetti, MD;  Location: [facility];  Service: Orthopedics;  Laterality: Left;  . TRANSFORAMINAL LUMBAR  INTERBODY FUSION (TLIF) WITH PEDICLE SCREW FIXATION 2 LEVEL N/A 11/07/2015   Procedure: Lumbar three-four Lumbar four-five  transforaminal lumbar interbody fusion with interbody prosthesis posterior lateral arthrodesis and posterior segmental instrumentation;  Surgeon: Newman Pies, MD;  Location: [facility];  Service: Neurosurgery;  Laterality: N/A;    Family History  Problem Relation Age of Onset  . Hypertension Other   . Cancer Other        ovarian  . Cancer Mother   . Heart disease Father     Social History   Tobacco Use  . Smoking status: Never Smoker  . Smokeless tobacco: Never Used  Substance Use Topics  . Alcohol use: No    Comment: rarely  . Drug use: No    Prior to Admission medications   Medication Sig Start Date End Date Taking? Authorizing Provider  ACCU-CHEK AVIVA PLUS test strip USE TO TEST BLOOD SUGAR ONCE DAILY DX: R73.03 10/15/18   Viviana Simpler I, MD  Accu-Chek Softclix Lancets lancets USE AS INSTRUCTED Dx Code R73.3 06/16/19   Venia Carbon, MD  acetaminophen (TYLENOL) 500 MG tablet Take 500 mg by mouth every 4 (four) hours as needed.    [provider]  Blood Glucose Monitoring Suppl (ACCU-CHEK AVIVA PLUS) w/Device KIT Use to test blood sugar once daily Dx: R73.03 07/10/16  Venia Carbon, MD  Cholecalciferol (VITAMIN D3) 25 MCG (1000 UT) CAPS Take by mouth.    [provider]  Chromium Picolinate 1000 MCG TABS Take 1,000 mcg by mouth daily.     [provider]  Coenzyme Q10 (CO Q 10) 100 MG CAPS Take 100 mg by mouth daily.     [provider]  Digestive Enzymes CAPS Take 2 capsules by mouth.     [provider]  DULoxetine (CYMBALTA) 60 MG capsule TAKE 1 CAPSULE (60 MG TOTAL) BY MOUTH 2 (TWO) TIMES DAILY. 06/22/19   Venia Carbon, MD  gabapentin (NEURONTIN) 300 MG capsule Take 2 capsules by mouth daily and 3 capsules by mouth at bedtime.    [provider]  lisinopril-hydrochlorothiazide  (ZESTORETIC) 20-25 MG tablet TAKE 2 TABLETS BY MOUTH EVERY DAY 08/10/19   Venia Carbon, MD  meloxicam (MOBIC) 15 MG tablet Take 1 tablet (15 mg total) by mouth daily. 12/15/18   Venia Carbon, MD  metFORMIN (GLUCOPHAGE-XR) 500 MG 24 hr tablet TAKE 1 TABLET BY MOUTH EVERY DAY WITH BREAKFAST 06/22/19   Venia Carbon, MD  Multiple Vitamins-Minerals (MULTIVITAMIN WITH MINERALS) tablet Take 1 tablet by mouth daily.     [provider]  Omega-3 Fatty Acids (FISH OIL) 1000 MG CAPS Take 2,000 mg by mouth.    [provider]  Soya Lecithin 1200 MG CAPS Take 1,200 mg by mouth 3 (three) times daily.     [provider]  sulfamethoxazole-trimethoprim (BACTRIM DS) 800-160 MG tablet Take 1 tablet by mouth 2 (two) times daily. 08/17/19   Johnn Hai, PA-C  traMADol (ULTRAM) 50 MG tablet Take 2 tablets (100 mg total) by mouth 3 (three) times daily as needed. 08/09/19   Venia Carbon, MD  vitamin C (ASCORBIC ACID) 500 MG tablet Take 500 mg by mouth daily.    [provider]    Allergies Patient has no known allergies.   REVIEW OF SYSTEMS  Negative except as noted here or in the History of Present Illness.   PHYSICAL EXAMINATION  Initial Vital Signs Blood pressure 127/87, pulse 93, temperature 98.7 F (37.1 C), temperature source Oral, resp. rate 16, height '5\' 3"'  (1.6 m), weight 81.6 kg, SpO2 100 %.  Examination General: Well-developed, well-nourished female in no acute distress; appearance consistent with age of record HENT: normocephalic; atraumatic Eyes: pupils equal, round and reactive to light; extraocular muscles intact Neck: supple Heart: regular rate and rhythm Lungs: clear to auscultation bilaterally Abdomen: soft; nondistended; nontender; bowel sounds present Extremities: No deformity; full range of motion; pulses normal; pustule at base of right index finger nail with swelling, erythema, tenderness and warmth of entire right index finger  extending onto right hand, minimal ability to flex or extend right index finger:    Neurologic: Awake, alert and oriented; motor function intact in all extremities and symmetric; no facial droop Skin: Warm and dry Psychiatric: Normal mood and affect   RESULTS  Summary of this visit's results, reviewed and interpreted by myself:   EKG Interpretation  Date/Time:    Ventricular Rate:    PR Interval:    QRS Duration:   QT Interval:    QTC Calculation:   R Axis:     Text Interpretation:        Laboratory Studies: Results for orders placed or performed during the hospital encounter of 08/17/19 (from the past 24 hour(s))  CBC with Differential     Status: Abnormal  Collection Time: 08/17/19  8:08 AM  Result Value Ref Range   WBC 14.4 (H) 4.0 - 10.5 K/uL   RBC 4.30 3.87 - 5.11 MIL/uL   Hemoglobin 14.5 12.0 - 15.0 g/dL   HCT 42.7 36.0 - 46.0 %   MCV 99.3 80.0 - 100.0 fL   MCH 33.7 26.0 - 34.0 pg   MCHC 34.0 30.0 - 36.0 g/dL   RDW 12.8 11.5 - 15.5 %   Platelets 275 150 - 400 K/uL   nRBC 0.0 0.0 - 0.2 %   Neutrophils Relative % 82 %   Neutro Abs 11.8 (H) 1.7 - 7.7 K/uL   Lymphocytes Relative 8 %   Lymphs Abs 1.2 0.7 - 4.0 K/uL   Monocytes Relative 8 %   Monocytes Absolute 1.1 (H) 0.1 - 1.0 K/uL   Eosinophils Relative 1 %   Eosinophils Absolute 0.1 0.0 - 0.5 K/uL   Basophils Relative 0 %   Basophils Absolute 0.0 0.0 - 0.1 K/uL   Immature Granulocytes 1 %   Abs Immature Granulocytes 0.07 0.00 - 0.07 K/uL  Comprehensive metabolic panel     Status: Abnormal   Collection Time: 08/17/19  8:08 AM  Result Value Ref Range   Sodium 138 135 - 145 mmol/L   Potassium 3.6 3.5 - 5.1 mmol/L   Chloride 102 98 - 111 mmol/L   CO2 25 22 - 32 mmol/L   Glucose, Bld 122 (H) 70 - 99 mg/dL   BUN 38 (H) 8 - 23 mg/dL   Creatinine, Ser 1.35 (H) 0.44 - 1.00 mg/dL   Calcium 9.0 8.9 - 10.3 mg/dL   Total Protein 6.8 6.5 - 8.1 g/dL   Albumin 3.8 3.5 - 5.0 g/dL   AST 21 15 - 41 U/L   ALT 18 0 -  44 U/L   Alkaline Phosphatase 47 38 - 126 U/L   Total Bilirubin 0.9 0.3 - 1.2 mg/dL   GFR calc non Af Amer 39 (L) >60 mL/min   GFR calc Af Amer 46 (L) >60 mL/min   Anion gap 11 5 - 15   Imaging Studies: DG Finger Index Right  Result Date: 08/18/2019 CLINICAL DATA:  Right index finger infection. Redness and swelling with purulence under fingernail. EXAM: RIGHT INDEX FINGER 2+V COMPARISON:  None. FINDINGS: There is no evidence of fracture or dislocation. No periosteal reaction or bony destruction. There is osteoarthritis of the distal interphalangeal joint. Focal soft tissue prominence about the distal digit, more prominent about the radial aspect. No soft tissue air. No radiopaque foreign body. IMPRESSION: 1. Soft tissue prominence about the distal digit without soft tissue air or underlying osseous abnormality. No radiographic findings of osteomyelitis. 2. Osteoarthritis of the distal interphalangeal joint. Electronically Signed   By: Keith Rake M.D.   On: 08/18/2019 00:56    ED COURSE and MDM  Nursing notes, initial and subsequent vitals signs, including pulse oximetry, reviewed and interpreted by myself.  Vitals:   08/17/19 2356 08/18/19 0043  BP: 127/87   Pulse: 93   Resp: 16   Temp: 98.7 F (37.1 C)   TempSrc: Oral   SpO2: 95% 100%  Weight: 81.6 kg   Height: '5\' 3"'  (1.6 m)    Medications  vancomycin (VANCOCIN) IVPB 1000 mg/200 mL premix (1,000 mg Intravenous New Bag/Given 08/18/19 0411)    5:43 AM After sample of pus was obtained and sent for culture the patient was given 1 g of vancomycin IV.  Her care was discussed with Dr.  Gavin Pound, Copy, who will see her in his office later this morning.  PROCEDURES  Procedures  INCISION AND DRAINAGE Performed by: Karen Chafe Celines Femia Consent: Verbal consent obtained. Risks and benefits: risks, benefits and alternatives were discussed Type: abscess  Body area: Right index finger  Anesthesia: None  Complexity:  Simple  Finger was prepped with chlorhexidine.  Pustule was opened with an 18-gauge needle.  Drainage: purulent  Drainage amount: Small; sent for culture  Packing material: None  Patient tolerance: Patient tolerated the procedure well with no immediate complications.   ED DIAGNOSES     ICD-10-CM   1. Cellulitis and abscess of hand  L03.119    L02.519        Cashlyn Huguley, Jenny Reichmann, MD 08/18/19 [phone]

## 2019-08-19 ENCOUNTER — Encounter: Payer: Self-pay | Admitting: Orthopaedic Surgery

## 2019-08-20 LAB — AEROBIC CULTURE W GRAM STAIN (SUPERFICIAL SPECIMEN)

## 2019-08-21 ENCOUNTER — Telehealth: Payer: Self-pay | Admitting: Emergency Medicine

## 2019-08-21 NOTE — Telephone Encounter (Signed)
Post ED Visit - Positive Culture Follow-up  Culture report reviewed by antimicrobial stewardship pharmacist: [facility] Team []  Elenor Quinones, Pharm.D. []  Heide Guile, Pharm.D., BCPS AQ-ID []  Parks Neptune, Pharm.D., BCPS []  Alycia Rossetti, Pharm.D., BCPS []  Clark, Pharm.D., BCPS, AAHIVP []  Legrand Como, Pharm.D., BCPS, AAHIVP []  Salome Arnt, PharmD, BCPS []  Johnnette Gourd, PharmD, BCPS []  Hughes Better, PharmD, BCPS [x]  Duanne Limerick, PharmD []  Laqueta Linden, PharmD, BCPS []  Albertina Parr, PharmD  [facility] Team []  Leodis Sias, PharmD []  Lindell Spar, PharmD []  Royetta Asal, PharmD []  Graylin Shiver, Rph []  Rema Fendt) Glennon Mac, PharmD []  Arlyn Dunning, PharmD []  Netta Cedars, PharmD []  Dia Sitter, PharmD []  Leone Haven, PharmD []  Gretta Arab, PharmD []  Theodis Shove, PharmD []  Peggyann Juba, PharmD []  Reuel Boom, PharmD   Positive Aerobic culture Treated with Bactrim, organism sensitive to the same and no further patient follow-up is required at this time.  Sandi Raveling Laykin Rainone 08/21/2019, 4:02 PM

## 2019-08-22 ENCOUNTER — Ambulatory Visit: Payer: Medicare Other | Admitting: Physician Assistant

## 2019-08-22 ENCOUNTER — Encounter: Payer: Self-pay | Admitting: Internal Medicine

## 2019-08-22 ENCOUNTER — Other Ambulatory Visit: Payer: Self-pay

## 2019-08-22 ENCOUNTER — Telehealth: Payer: Self-pay

## 2019-08-22 ENCOUNTER — Ambulatory Visit (INDEPENDENT_AMBULATORY_CARE_PROVIDER_SITE_OTHER): Payer: Medicare Other | Admitting: Internal Medicine

## 2019-08-22 DIAGNOSIS — L03011 Cellulitis of right finger: Secondary | ICD-10-CM | POA: Diagnosis not present

## 2019-08-22 MED ORDER — CEFTRIAXONE SODIUM 1 G IJ SOLR
1.0000 g | Freq: Once | INTRAMUSCULAR | Status: AC
  Administered 2019-08-22: 1 g via INTRAMUSCULAR

## 2019-08-22 NOTE — Telephone Encounter (Signed)
Per pt appt notes pt already has appt with Dr Silvio Pate today at 12:15.

## 2019-08-22 NOTE — Assessment & Plan Note (Addendum)
Hand is much better but the finger is still very swollen and inflamed Looks like it needs parenteral Rx Will give rocephin here Continue the septra orally See again tomorrow If not better, probably will need admission for parenteral antibiotics

## 2019-08-22 NOTE — Addendum Note (Signed)
Addended by: Pilar Grammes on: 08/22/2019 01:09 PM   Modules accepted: Orders

## 2019-08-22 NOTE — Telephone Encounter (Signed)
Just seen Will give rocephin and see again tomorrow--still with significant cellulitis in the finger

## 2019-08-22 NOTE — Telephone Encounter (Signed)
[name] - Client TELEPHONE ADVICE RECORD [facility] Patient Name: [name] Gender: Female DOB: Oct 09, 1945 Age: 73 Y 50 M 16 D Return Phone Number: [phone] (Primary) Address: City/State/Zip: [place] [address] Client [facility] Night - Client Client Site [place] Physician Viviana Simpler - MD Contact Type Call Who Is Calling Patient / Member / Family / Caregiver Call Type Triage / Clinical Relationship To Patient Self Return Phone Number [phone] (Primary) Chief Complaint Finger Pain Reason for Call Symptomatic / Request for [place] states she has an infection in her finger. Translation No Nurse Assessment Guidelines Guideline Title Affirmed Question Affirmed Notes Nurse Date/Time (Eastern Time) Disp. Time Eilene Ghazi Time) Disposition Final User 08/20/2019 6:13:47 PM FINAL ATTEMPT MADE - message left

## 2019-08-22 NOTE — Progress Notes (Signed)
Subjective:    Patient ID: [name], female    DOB: Sep 17, 1945, 73 y.o.   MRN: [id number]  HPI Here for follow up of infection in finger and hand 2 visits to ER  This visit occurred during the SARS-CoV-2 public health emergency.  Safety protocols were in place, including screening questions prior to the visit, additional usage of staff PPE, and extensive cleaning of exam room while observing appropriate contact time as indicated for disinfecting solutions.   She noticed abnormal area proximal to right 2nd fingernail--seemed like fluid in it Lanced it with a needle (sewing)--doesn't remember if she sterilized it ~ December 6th Nothing came out Then started worsening so to ER Reviewed ER records--got IV rx both times Then sent home on septra Using this bid  Ongoing swelling Not really painful  Current Outpatient Medications on File Prior to Visit  Medication Sig Dispense Refill  . ACCU-CHEK AVIVA PLUS test strip USE TO TEST BLOOD SUGAR ONCE DAILY DX: R73.03 100 each 3  . Accu-Chek Softclix Lancets lancets USE AS INSTRUCTED Dx Code R73.3 100 each 3  . acetaminophen (TYLENOL) 500 MG tablet Take 500 mg by mouth every 4 (four) hours as needed.    . Blood Glucose Monitoring Suppl (ACCU-CHEK AVIVA PLUS) w/Device KIT Use to test blood sugar once daily Dx: R73.03 1 kit 0  . Cholecalciferol (VITAMIN D3) 25 MCG (1000 UT) CAPS Take by mouth.    . Chromium Picolinate 1000 MCG TABS Take 1,000 mcg by mouth daily.     . Coenzyme Q10 (CO Q 10) 100 MG CAPS Take 100 mg by mouth daily.     . Digestive Enzymes CAPS Take 2 capsules by mouth.     . DULoxetine (CYMBALTA) 60 MG capsule TAKE 1 CAPSULE (60 MG TOTAL) BY MOUTH 2 (TWO) TIMES DAILY. 180 capsule 0  . gabapentin (NEURONTIN) 300 MG capsule Take 2 capsules by mouth daily and 3 capsules by mouth at bedtime.    Marland Kitchen lisinopril-hydrochlorothiazide (ZESTORETIC) 20-25 MG tablet TAKE 2 TABLETS BY MOUTH EVERY DAY 180 tablet 3  . meloxicam (MOBIC) 15  MG tablet Take 1 tablet (15 mg total) by mouth daily. 90 tablet 3  . metFORMIN (GLUCOPHAGE-XR) 500 MG 24 hr tablet TAKE 1 TABLET BY MOUTH EVERY DAY WITH BREAKFAST 90 tablet 0  . Multiple Vitamins-Minerals (MULTIVITAMIN WITH MINERALS) tablet Take 1 tablet by mouth daily.     . Omega-3 Fatty Acids (FISH OIL) 1000 MG CAPS Take 2,000 mg by mouth.    Edythe Lynn Lecithin 1200 MG CAPS Take 1,200 mg by mouth 3 (three) times daily.     . traMADol (ULTRAM) 50 MG tablet Take 2 tablets (100 mg total) by mouth 3 (three) times daily as needed. 180 tablet 0  . vitamin C (ASCORBIC ACID) 500 MG tablet Take 500 mg by mouth daily.     No current facility-administered medications on file prior to visit.    No Known Allergies  Past Medical History:  Diagnosis Date  . Arthritis   . Confusion   . Diabetes mellitus without complication ([place])    type 2  . Hepatitis    pt. denies  . Hypertension   . Lumbar stenosis   . Memory loss   . Nocturia   . Numbness and tingling    legs and feet  . Wears glasses     Past Surgical History:  Procedure Laterality Date  . [place]  . COLONOSCOPY    .  HEMORRHOID SURGERY  2015  . JOINT REPLACEMENT     01-08-18 Dr. Kathrynn Speed  . TOTAL HIP ARTHROPLASTY Left 01/08/2018   Procedure: LEFT TOTAL HIP ARTHROPLASTY ANTERIOR APPROACH;  Surgeon: Mcarthur Rossetti, MD;  Location: [facility];  Service: Orthopedics;  Laterality: Left;  . TRANSFORAMINAL LUMBAR INTERBODY FUSION (TLIF) WITH PEDICLE SCREW FIXATION 2 LEVEL N/A 11/07/2015   Procedure: Lumbar three-four Lumbar four-five  transforaminal lumbar interbody fusion with interbody prosthesis posterior lateral arthrodesis and posterior segmental instrumentation;  Surgeon: Newman Pies, MD;  Location: [facility];  Service: Neurosurgery;  Laterality: N/A;    Family History  Problem Relation Age of Onset  . Hypertension Other   . Cancer Other        ovarian  . Cancer Mother   . Heart disease Father     Social  History   Socioeconomic History  . Marital status: Divorced    Spouse name: Not on file  . Number of children: 4  . Years of education: Not on file  . Highest education level: Associate degree: occupational, Hotel manager, or vocational program  Occupational History  . Not on file  Tobacco Use  . Smoking status: Never Smoker  . Smokeless tobacco: Never Used  Substance and Sexual Activity  . Alcohol use: No    Comment: rarely  . Drug use: No  . Sexual activity: Not Currently  Other Topics Concern  . Not on file  Social History Narrative   Has a living will.   Son is health care POA   Would accept resuscitation.  Would not want prolonged life support if futile.   Probably would not want tube feeds if cognitively unaware   Social Determinants of Health   Financial Resource Strain:   . Difficulty of Paying Living Expenses: Not on file  Food Insecurity:   . Worried About Charity fundraiser in the Last Year: Not on file  . Ran Out of Food in the Last Year: Not on file  Transportation Needs:   . Lack of Transportation (Medical): Not on file  . Lack of Transportation (Non-Medical): Not on file  Physical Activity:   . Days of Exercise per Week: Not on file  . Minutes of Exercise per Session: Not on file  Stress:   . Feeling of Stress : Not on file  Social Connections:   . Frequency of Communication with Friends and Family: Not on file  . Frequency of Social Gatherings with Friends and Family: Not on file  . Attends Religious Services: Not on file  . Active Member of Clubs or Organizations: Not on file  . Attends Archivist Meetings: Not on file  . Marital Status: Not on file  Intimate Partner Violence:   . Fear of Current or Ex-Partner: Not on file  . Emotionally Abused: Not on file  . Physically Abused: Not on file  . Sexually Abused: Not on file   Review of Systems  No fever Appetite is okay     Objective:   Physical Exam  Constitutional: No distress.    Skin:  Left second finger still markedly swollen and red Extensor distal digit is open with some purulent discharge Redness on hand is mostly resolved though           Assessment & Plan:

## 2019-08-23 ENCOUNTER — Ambulatory Visit (INDEPENDENT_AMBULATORY_CARE_PROVIDER_SITE_OTHER): Payer: Medicare Other | Admitting: Internal Medicine

## 2019-08-23 ENCOUNTER — Encounter: Payer: Self-pay | Admitting: Internal Medicine

## 2019-08-23 DIAGNOSIS — L03011 Cellulitis of right finger: Secondary | ICD-10-CM

## 2019-08-23 MED ORDER — CEFTRIAXONE SODIUM 1 G IJ SOLR
1.0000 g | Freq: Once | INTRAMUSCULAR | Status: AC
  Administered 2019-08-23: 1 g via INTRAMUSCULAR

## 2019-08-23 MED ORDER — AMOXICILLIN-POT CLAVULANATE 875-125 MG PO TABS: 1.0000 | ORAL_TABLET | Freq: Two times a day (BID) | ORAL | 0 refills | Status: DC

## 2019-08-23 NOTE — Progress Notes (Signed)
Subjective:    Patient ID: [name], female    DOB: 15-Jun-1946, 73 y.o.   MRN: [id number]  HPI Here for follow up of finger cellulitis  This visit occurred during the SARS-CoV-2 public health emergency.  Safety protocols were in place, including screening questions prior to the visit, additional usage of staff PPE, and extensive cleaning of exam room while observing appropriate contact time as indicated for disinfecting solutions.   No problems with the injection Some discharge from the open area Pain is better--but was bad this morning  Current Outpatient Medications on File Prior to Visit  Medication Sig Dispense Refill  . ACCU-CHEK AVIVA PLUS test strip USE TO TEST BLOOD SUGAR ONCE DAILY DX: R73.03 100 each 3  . Accu-Chek Softclix Lancets lancets USE AS INSTRUCTED Dx Code R73.3 100 each 3  . acetaminophen (TYLENOL) 500 MG tablet Take 500 mg by mouth every 4 (four) hours as needed.    . Blood Glucose Monitoring Suppl (ACCU-CHEK AVIVA PLUS) w/Device KIT Use to test blood sugar once daily Dx: R73.03 1 kit 0  . Cholecalciferol (VITAMIN D3) 25 MCG (1000 UT) CAPS Take by mouth.    . Chromium Picolinate 1000 MCG TABS Take 1,000 mcg by mouth daily.     . Coenzyme Q10 (CO Q 10) 100 MG CAPS Take 100 mg by mouth daily.     . Digestive Enzymes CAPS Take 2 capsules by mouth.     . DULoxetine (CYMBALTA) 60 MG capsule TAKE 1 CAPSULE (60 MG TOTAL) BY MOUTH 2 (TWO) TIMES DAILY. 180 capsule 0  . gabapentin (NEURONTIN) 300 MG capsule Take 2 capsules by mouth daily and 3 capsules by mouth at bedtime.    Marland Kitchen lisinopril-hydrochlorothiazide (ZESTORETIC) 20-25 MG tablet TAKE 2 TABLETS BY MOUTH EVERY DAY 180 tablet 3  . meloxicam (MOBIC) 15 MG tablet Take 1 tablet (15 mg total) by mouth daily. 90 tablet 3  . metFORMIN (GLUCOPHAGE-XR) 500 MG 24 hr tablet TAKE 1 TABLET BY MOUTH EVERY DAY WITH BREAKFAST 90 tablet 0  . Multiple Vitamins-Minerals (MULTIVITAMIN WITH MINERALS) tablet Take 1 tablet by mouth  daily.     . Omega-3 Fatty Acids (FISH OIL) 1000 MG CAPS Take 2,000 mg by mouth.    Edythe Lynn Lecithin 1200 MG CAPS Take 1,200 mg by mouth 3 (three) times daily.     . traMADol (ULTRAM) 50 MG tablet Take 2 tablets (100 mg total) by mouth 3 (three) times daily as needed. 180 tablet 0  . vitamin C (ASCORBIC ACID) 500 MG tablet Take 500 mg by mouth daily.     No current facility-administered medications on file prior to visit.    No Known Allergies  Past Medical History:  Diagnosis Date  . Arthritis   . Confusion   . Diabetes mellitus without complication ([place])    type 2  . Hepatitis    pt. denies  . Hypertension   . Lumbar stenosis   . Memory loss   . Nocturia   . Numbness and tingling    legs and feet  . Wears glasses     Past Surgical History:  Procedure Laterality Date  . [place]  . COLONOSCOPY    . HEMORRHOID SURGERY  2015  . JOINT REPLACEMENT     01-08-18 Dr. Kathrynn Speed  . TOTAL HIP ARTHROPLASTY Left 01/08/2018   Procedure: LEFT TOTAL HIP ARTHROPLASTY ANTERIOR APPROACH;  Surgeon: Mcarthur Rossetti, MD;  Location: [facility];  Service: Orthopedics;  Laterality: Left;  . TRANSFORAMINAL LUMBAR INTERBODY FUSION (TLIF) WITH PEDICLE SCREW FIXATION 2 LEVEL N/A 11/07/2015   Procedure: Lumbar three-four Lumbar four-five  transforaminal lumbar interbody fusion with interbody prosthesis posterior lateral arthrodesis and posterior segmental instrumentation;  Surgeon: Newman Pies, MD;  Location: [place] [facility];  Service: Neurosurgery;  Laterality: N/A;    Family History  Problem Relation Age of Onset  . Hypertension Other   . Cancer Other        ovarian  . Cancer Mother   . Heart disease Father     Social History   Socioeconomic History  . Marital status: Divorced    Spouse name: Not on file  . Number of children: 4  . Years of education: Not on file  . Highest education level: Associate degree: occupational, Hotel manager, or vocational program  Occupational  History  . Not on file  Tobacco Use  . Smoking status: Never Smoker  . Smokeless tobacco: Never Used  Substance and Sexual Activity  . Alcohol use: No    Comment: rarely  . Drug use: No  . Sexual activity: Not Currently  Other Topics Concern  . Not on file  Social History Narrative   Has a living will.   Son is health care POA   Would accept resuscitation.  Would not want prolonged life support if futile.   Probably would not want tube feeds if cognitively unaware   Social Determinants of Health   Financial Resource Strain:   . Difficulty of Paying Living Expenses: Not on file  Food Insecurity:   . Worried About Charity fundraiser in the Last Year: Not on file  . Ran Out of Food in the Last Year: Not on file  Transportation Needs:   . Lack of Transportation (Medical): Not on file  . Lack of Transportation (Non-Medical): Not on file  Physical Activity:   . Days of Exercise per Week: Not on file  . Minutes of Exercise per Session: Not on file  Stress:   . Feeling of Stress : Not on file  Social Connections:   . Frequency of Communication with Friends and Family: Not on file  . Frequency of Social Gatherings with Friends and Family: Not on file  . Attends Religious Services: Not on file  . Active Member of Clubs or Organizations: Not on file  . Attends Archivist Meetings: Not on file  . Marital Status: Not on file  Intimate Partner Violence:   . Fear of Current or Ex-Partner: Not on file  . Emotionally Abused: Not on file  . Physically Abused: Not on file  . Sexually Abused: Not on file   Review of Systems No fever Eating fair    Objective:   Physical Exam  Skin:  Redness is more localized to right 2nd finger much more (no longer over 3rd and 4th MCP) Still some redness over extensor hand proximal to 2nd finger--but less prominent Finger is still very inflamed--but less warm and tender Open area with proud flesh over distal phalanx---but no clear pus             Assessment & Plan:

## 2019-08-23 NOTE — Addendum Note (Signed)
Addended by: Pilar Grammes on: 08/23/2019 02:30 PM   Modules accepted: Orders

## 2019-08-23 NOTE — Assessment & Plan Note (Signed)
Some better now Doubt this is MRSA--will change oral med to augmentin Rocephin again today and will check tomorrow again

## 2019-08-24 ENCOUNTER — Ambulatory Visit (INDEPENDENT_AMBULATORY_CARE_PROVIDER_SITE_OTHER): Payer: Medicare Other | Admitting: Internal Medicine

## 2019-08-24 ENCOUNTER — Other Ambulatory Visit: Payer: Self-pay

## 2019-08-24 ENCOUNTER — Encounter: Payer: Self-pay | Admitting: Internal Medicine

## 2019-08-24 DIAGNOSIS — L03011 Cellulitis of right finger: Secondary | ICD-10-CM | POA: Diagnosis not present

## 2019-08-24 DIAGNOSIS — E1140 Type 2 diabetes mellitus with diabetic neuropathy, unspecified: Secondary | ICD-10-CM

## 2019-08-24 MED ORDER — CEFTRIAXONE SODIUM 1 G IJ SOLR
1.0000 g | Freq: Once | INTRAMUSCULAR | Status: AC
  Administered 2019-08-24: 1 g via INTRAMUSCULAR

## 2019-08-24 MED ORDER — ATORVASTATIN CALCIUM 10 MG PO TABS: 10.0000 mg | ORAL_TABLET | ORAL | 3 refills | Status: DC

## 2019-08-24 NOTE — Addendum Note (Signed)
Addended by: Pilar Grammes on: 08/24/2019 01:11 PM   Modules accepted: Orders

## 2019-08-24 NOTE — Assessment & Plan Note (Signed)
Still looks nasty---but clear improvement She asks about hospitalization---discussed that unless she was worsening, I don't think that is needed (since we are giving parenteral antibiotic) Will continue with the rocephin augmentin Instructed her to go to ER if any worsening, fever, etc

## 2019-08-24 NOTE — Assessment & Plan Note (Signed)
Discussed SUPD She agrees to try once a week statin

## 2019-08-24 NOTE — Progress Notes (Signed)
   Subjective:    Patient ID: [name], female    DOB: [birth year], 73 y.o.   MRN: [id number]  HPI Here for follow up of severe cellulitis in finger  This visit occurred during the SARS-CoV-2 public health emergency.  Safety protocols were in place, including screening questions prior to the visit, additional usage of staff PPE, and extensive cleaning of exam room while observing appropriate contact time as indicated for disinfecting solutions.   No problems with the injection No trouble starting the augmentin Not overly painful---still limited mobility   Review of Systems No fever Eating okay No diarrhea    Objective:   Physical Exam  Constitutional: No distress.  Skin:  Right second finger still significantly swollen but the redness is more dull Some improvement in ROM/flexion of finger Redness at 2nd MCP and in hand is just about gone           Assessment & Plan:

## 2019-08-25 ENCOUNTER — Encounter: Payer: Self-pay | Admitting: Internal Medicine

## 2019-08-25 ENCOUNTER — Ambulatory Visit (INDEPENDENT_AMBULATORY_CARE_PROVIDER_SITE_OTHER): Payer: Medicare Other | Admitting: Internal Medicine

## 2019-08-25 DIAGNOSIS — L03011 Cellulitis of right finger: Secondary | ICD-10-CM

## 2019-08-25 MED ORDER — CEFTRIAXONE SODIUM 1 G IJ SOLR
1.0000 g | Freq: Once | INTRAMUSCULAR | Status: AC
  Administered 2019-08-25: 17:00:00 1 g via INTRAMUSCULAR

## 2019-08-25 NOTE — Assessment & Plan Note (Signed)
Clear improvement to me (and finally to her) Will give the rocephin again today and check her tomorrow again Continue the augmentin She wants to cut off the "proud flesh" but told her we have to let that heal on its own Will need to see next week to decide how long for the augmentin

## 2019-08-25 NOTE — Progress Notes (Signed)
Subjective:    Patient ID: [name], female    DOB: 10-Mar-1946, 73 y.o.   MRN: [id number]  HPI Here for follow up of severe finger cellulitis  This visit occurred during the SARS-CoV-2 public health emergency.  Safety protocols were in place, including screening questions prior to the visit, additional usage of staff PPE, and extensive cleaning of exam room while observing appropriate contact time as indicated for disinfecting solutions.   She finally feels it is better Less redness Somewhat less swollen More active movement--able to use it a little while cutting up a melon  No problems with the antibiotics  Current Outpatient Medications on File Prior to Visit  Medication Sig Dispense Refill  . ACCU-CHEK AVIVA PLUS test strip USE TO TEST BLOOD SUGAR ONCE DAILY DX: R73.03 100 each 3  . Accu-Chek Softclix Lancets lancets USE AS INSTRUCTED Dx Code R73.3 100 each 3  . acetaminophen (TYLENOL) 500 MG tablet Take 500 mg by mouth every 4 (four) hours as needed.    Marland Kitchen amoxicillin-clavulanate (AUGMENTIN) 875-125 MG tablet Take 1 tablet by mouth 2 (two) times daily. 14 tablet 0  . atorvastatin (LIPITOR) 10 MG tablet Take 1 tablet (10 mg total) by mouth once a week. 13 tablet 3  . Blood Glucose Monitoring Suppl (ACCU-CHEK AVIVA PLUS) w/Device KIT Use to test blood sugar once daily Dx: R73.03 1 kit 0  . Cholecalciferol (VITAMIN D3) 25 MCG (1000 UT) CAPS Take by mouth.    . Chromium Picolinate 1000 MCG TABS Take 1,000 mcg by mouth daily.     . Coenzyme Q10 (CO Q 10) 100 MG CAPS Take 100 mg by mouth daily.     . Digestive Enzymes CAPS Take 2 capsules by mouth.     . DULoxetine (CYMBALTA) 60 MG capsule TAKE 1 CAPSULE (60 MG TOTAL) BY MOUTH 2 (TWO) TIMES DAILY. 180 capsule 0  . gabapentin (NEURONTIN) 300 MG capsule Take 2 capsules by mouth daily and 3 capsules by mouth at bedtime.    Marland Kitchen lisinopril-hydrochlorothiazide (ZESTORETIC) 20-25 MG tablet TAKE 2 TABLETS BY MOUTH EVERY DAY 180 tablet 3  .  meloxicam (MOBIC) 15 MG tablet Take 1 tablet (15 mg total) by mouth daily. 90 tablet 3  . metFORMIN (GLUCOPHAGE-XR) 500 MG 24 hr tablet TAKE 1 TABLET BY MOUTH EVERY DAY WITH BREAKFAST 90 tablet 0  . Multiple Vitamins-Minerals (MULTIVITAMIN WITH MINERALS) tablet Take 1 tablet by mouth daily.     . Omega-3 Fatty Acids (FISH OIL) 1000 MG CAPS Take 2,000 mg by mouth.    Edythe Lynn Lecithin 1200 MG CAPS Take 1,200 mg by mouth 3 (three) times daily.     . traMADol (ULTRAM) 50 MG tablet Take 2 tablets (100 mg total) by mouth 3 (three) times daily as needed. 180 tablet 0  . vitamin C (ASCORBIC ACID) 500 MG tablet Take 500 mg by mouth daily.     No current facility-administered medications on file prior to visit.    No Known Allergies  Past Medical History:  Diagnosis Date  . Arthritis   . Confusion   . Diabetes mellitus without complication ([place])    type 2  . Hepatitis    pt. denies  . Hypertension   . Lumbar stenosis   . Memory loss   . Nocturia   . Numbness and tingling    legs and feet  . Wears glasses     Past Surgical History:  Procedure Laterality Date  . [place]  .  COLONOSCOPY    . HEMORRHOID SURGERY  2015  . JOINT REPLACEMENT     01-08-18 Dr. Kathrynn Speed  . TOTAL HIP ARTHROPLASTY Left 01/08/2018   Procedure: LEFT TOTAL HIP ARTHROPLASTY ANTERIOR APPROACH;  Surgeon: Mcarthur Rossetti, MD;  Location: [facility];  Service: Orthopedics;  Laterality: Left;  . TRANSFORAMINAL LUMBAR INTERBODY FUSION (TLIF) WITH PEDICLE SCREW FIXATION 2 LEVEL N/A 11/07/2015   Procedure: Lumbar three-four Lumbar four-five  transforaminal lumbar interbody fusion with interbody prosthesis posterior lateral arthrodesis and posterior segmental instrumentation;  Surgeon: Newman Pies, MD;  Location: [place] [facility];  Service: Neurosurgery;  Laterality: N/A;    Family History  Problem Relation Age of Onset  . Hypertension Other   . Cancer Other        ovarian  . Cancer Mother   . Heart disease  Father     Social History   Socioeconomic History  . Marital status: Divorced    Spouse name: Not on file  . Number of children: 4  . Years of education: Not on file  . Highest education level: Associate degree: occupational, Hotel manager, or vocational program  Occupational History  . Not on file  Tobacco Use  . Smoking status: Never Smoker  . Smokeless tobacco: Never Used  Substance and Sexual Activity  . Alcohol use: No    Comment: rarely  . Drug use: No  . Sexual activity: Not Currently  Other Topics Concern  . Not on file  Social History Narrative   Has a living will.   Son is health care POA   Would accept resuscitation.  Would not want prolonged life support if futile.   Probably would not want tube feeds if cognitively unaware   Social Determinants of Health   Financial Resource Strain:   . Difficulty of Paying Living Expenses: Not on file  Food Insecurity:   . Worried About Charity fundraiser in the Last Year: Not on file  . Ran Out of Food in the Last Year: Not on file  Transportation Needs:   . Lack of Transportation (Medical): Not on file  . Lack of Transportation (Non-Medical): Not on file  Physical Activity:   . Days of Exercise per Week: Not on file  . Minutes of Exercise per Session: Not on file  Stress:   . Feeling of Stress : Not on file  Social Connections:   . Frequency of Communication with Friends and Family: Not on file  . Frequency of Social Gatherings with Friends and Family: Not on file  . Attends Religious Services: Not on file  . Active Member of Clubs or Organizations: Not on file  . Attends Archivist Meetings: Not on file  . Marital Status: Not on file  Intimate Partner Violence:   . Fear of Current or Ex-Partner: Not on file  . Emotionally Abused: Not on file  . Physically Abused: Not on file  . Sexually Abused: Not on file   Review of Systems  No fever  Appetite still fine No diarrhea--but stool looser yesterday       Objective:   Physical Exam  Skin:  Left hand now completely normal Pale redness only in proximal phalanx now Still with "proud flesh" proximal to nail Not warm or tender More flexion actively           Assessment & Plan:

## 2019-08-25 NOTE — Addendum Note (Signed)
Addended by: Pilar Grammes on: 08/25/2019 05:21 PM   Modules accepted: Orders

## 2019-08-26 ENCOUNTER — Other Ambulatory Visit: Payer: Self-pay | Admitting: Internal Medicine

## 2019-08-26 ENCOUNTER — Other Ambulatory Visit: Payer: Self-pay

## 2019-08-26 ENCOUNTER — Encounter: Payer: Self-pay | Admitting: Internal Medicine

## 2019-08-26 ENCOUNTER — Ambulatory Visit (INDEPENDENT_AMBULATORY_CARE_PROVIDER_SITE_OTHER): Payer: Medicare Other | Admitting: Internal Medicine

## 2019-08-26 DIAGNOSIS — L03011 Cellulitis of right finger: Secondary | ICD-10-CM

## 2019-08-26 MED ORDER — CEFTRIAXONE SODIUM 1 G IJ SOLR
1.0000 g | Freq: Once | INTRAMUSCULAR | Status: AC
  Administered 2019-08-26: 13:00:00 1 g via INTRAMUSCULAR

## 2019-08-26 NOTE — Assessment & Plan Note (Signed)
Marked improvement over the past few days Will give rocephin again Hopefully the oral antibiotics should be enough at this point Will check next week to see if ongoing improvement and to decide about whether to extend the augmentin

## 2019-08-26 NOTE — Addendum Note (Signed)
Addended by: Pilar Grammes on: 08/26/2019 01:12 PM   Modules accepted: Orders

## 2019-08-26 NOTE — Progress Notes (Signed)
Subjective:    Patient ID: [name], female    DOB: 01/27/1946, 73 y.o.   MRN: [id number]  HPI Here for follow up again of the severe cellulitis in her finger  This visit occurred during the SARS-CoV-2 public health emergency.  Safety protocols were in place, including screening questions prior to the visit, additional usage of staff PPE, and extensive cleaning of exam room while observing appropriate contact time as indicated for disinfecting solutions.   Doing okay Less swelling and pain  Some nausea when she gets hungry No clear problems with the antibiotic--though has some rectal urgency  Current Outpatient Medications on File Prior to Visit  Medication Sig Dispense Refill  . ACCU-CHEK AVIVA PLUS test strip USE TO TEST BLOOD SUGAR ONCE DAILY DX: R73.03 100 each 3  . Accu-Chek Softclix Lancets lancets USE AS INSTRUCTED Dx Code R73.3 100 each 3  . acetaminophen (TYLENOL) 500 MG tablet Take 500 mg by mouth every 4 (four) hours as needed.    Marland Kitchen amoxicillin-clavulanate (AUGMENTIN) 875-125 MG tablet Take 1 tablet by mouth 2 (two) times daily. 14 tablet 0  . atorvastatin (LIPITOR) 10 MG tablet Take 1 tablet (10 mg total) by mouth once a week. 13 tablet 3  . Blood Glucose Monitoring Suppl (ACCU-CHEK AVIVA PLUS) w/Device KIT Use to test blood sugar once daily Dx: R73.03 1 kit 0  . Cholecalciferol (VITAMIN D3) 25 MCG (1000 UT) CAPS Take by mouth.    . Chromium Picolinate 1000 MCG TABS Take 1,000 mcg by mouth daily.     . Coenzyme Q10 (CO Q 10) 100 MG CAPS Take 100 mg by mouth daily.     . Digestive Enzymes CAPS Take 2 capsules by mouth.     . DULoxetine (CYMBALTA) 60 MG capsule TAKE 1 CAPSULE (60 MG TOTAL) BY MOUTH 2 (TWO) TIMES DAILY. 180 capsule 0  . gabapentin (NEURONTIN) 300 MG capsule Take 2 capsules by mouth daily and 3 capsules by mouth at bedtime.    Marland Kitchen lisinopril-hydrochlorothiazide (ZESTORETIC) 20-25 MG tablet TAKE 2 TABLETS BY MOUTH EVERY DAY 180 tablet 3  . meloxicam  (MOBIC) 15 MG tablet Take 1 tablet (15 mg total) by mouth daily. 90 tablet 3  . metFORMIN (GLUCOPHAGE-XR) 500 MG 24 hr tablet TAKE 1 TABLET BY MOUTH EVERY DAY WITH BREAKFAST 90 tablet 0  . Multiple Vitamins-Minerals (MULTIVITAMIN WITH MINERALS) tablet Take 1 tablet by mouth daily.     . Omega-3 Fatty Acids (FISH OIL) 1000 MG CAPS Take 2,000 mg by mouth.    Edythe Lynn Lecithin 1200 MG CAPS Take 1,200 mg by mouth 3 (three) times daily.     . traMADol (ULTRAM) 50 MG tablet Take 2 tablets (100 mg total) by mouth 3 (three) times daily as needed. 180 tablet 0  . vitamin C (ASCORBIC ACID) 500 MG tablet Take 500 mg by mouth daily.     No current facility-administered medications on file prior to visit.    No Known Allergies  Past Medical History:  Diagnosis Date  . Arthritis   . Confusion   . Diabetes mellitus without complication ([place])    type 2  . Hepatitis    pt. denies  . Hypertension   . Lumbar stenosis   . Memory loss   . Nocturia   . Numbness and tingling    legs and feet  . Wears glasses     Past Surgical History:  Procedure Laterality Date  . [place]  . COLONOSCOPY    .  HEMORRHOID SURGERY  2015  . JOINT REPLACEMENT     01-08-18 Dr. Kathrynn Speed  . TOTAL HIP ARTHROPLASTY Left 01/08/2018   Procedure: LEFT TOTAL HIP ARTHROPLASTY ANTERIOR APPROACH;  Surgeon: Mcarthur Rossetti, MD;  Location: [facility];  Service: Orthopedics;  Laterality: Left;  . TRANSFORAMINAL LUMBAR INTERBODY FUSION (TLIF) WITH PEDICLE SCREW FIXATION 2 LEVEL N/A 11/07/2015   Procedure: Lumbar three-four Lumbar four-five  transforaminal lumbar interbody fusion with interbody prosthesis posterior lateral arthrodesis and posterior segmental instrumentation;  Surgeon: Newman Pies, MD;  Location: [place] [facility];  Service: Neurosurgery;  Laterality: N/A;    Family History  Problem Relation Age of Onset  . Hypertension Other   . Cancer Other        ovarian  . Cancer Mother   . Heart disease Father       Social History   Socioeconomic History  . Marital status: Divorced    Spouse name: Not on file  . Number of children: 4  . Years of education: Not on file  . Highest education level: Associate degree: occupational, Hotel manager, or vocational program  Occupational History  . Not on file  Tobacco Use  . Smoking status: Never Smoker  . Smokeless tobacco: Never Used  Substance and Sexual Activity  . Alcohol use: No    Comment: rarely  . Drug use: No  . Sexual activity: Not Currently  Other Topics Concern  . Not on file  Social History Narrative   Has a living will.   Son is health care POA   Would accept resuscitation.  Would not want prolonged life support if futile.   Probably would not want tube feeds if cognitively unaware   Social Determinants of Health   Financial Resource Strain:   . Difficulty of Paying Living Expenses: Not on file  Food Insecurity:   . Worried About Charity fundraiser in the Last Year: Not on file  . Ran Out of Food in the Last Year: Not on file  Transportation Needs:   . Lack of Transportation (Medical): Not on file  . Lack of Transportation (Non-Medical): Not on file  Physical Activity:   . Days of Exercise per Week: Not on file  . Minutes of Exercise per Session: Not on file  Stress:   . Feeling of Stress : Not on file  Social Connections:   . Frequency of Communication with Friends and Family: Not on file  . Frequency of Social Gatherings with Friends and Family: Not on file  . Attends Religious Services: Not on file  . Active Member of Clubs or Organizations: Not on file  . Attends Archivist Meetings: Not on file  . Marital Status: Not on file  Intimate Partner Violence:   . Fear of Current or Ex-Partner: Not on file  . Emotionally Abused: Not on file  . Physically Abused: Not on file  . Sexually Abused: Not on file   Review of Systems No fever Eating fair    Objective:   Physical Exam  Constitutional: She appears  well-developed. No distress.  Skin:  Further reduction in the swelling Only pale redness in proximal phalanx Significantly less inflamed tissue (proud flesh) in distal phalanx Improved flexion but still lacks extension at DIP           Assessment & Plan:

## 2019-08-29 ENCOUNTER — Other Ambulatory Visit: Payer: Self-pay

## 2019-08-29 ENCOUNTER — Ambulatory Visit (INDEPENDENT_AMBULATORY_CARE_PROVIDER_SITE_OTHER): Payer: Medicare Other | Admitting: Internal Medicine

## 2019-08-29 ENCOUNTER — Encounter: Payer: Self-pay | Admitting: Internal Medicine

## 2019-08-29 DIAGNOSIS — L03011 Cellulitis of right finger: Secondary | ICD-10-CM

## 2019-08-29 MED ORDER — SILVER SULFADIAZINE 1 % EX CREA: 1.0000 "application " | TOPICAL_CREAM | Freq: Every day | CUTANEOUS | 0 refills | Status: DC

## 2019-08-29 MED ORDER — CEPHALEXIN 500 MG PO CAPS: 500.0000 mg | ORAL_CAPSULE | Freq: Three times a day (TID) | ORAL | 1 refills | Status: DC

## 2019-08-29 NOTE — Progress Notes (Signed)
Subjective:    Patient ID: [name], female    DOB: [birth year], 73 y.o.   MRN: [id number]  HPI  Here for follow up of right 2nd finger cellulitis--severe at first  This visit occurred during the SARS-CoV-2 public health emergency.  Safety protocols were in place, including screening questions prior to the visit, additional usage of staff PPE, and extensive cleaning of exam room while observing appropriate contact time as indicated for disinfecting solutions.   Finished the antibiotic yesterday Less pain and swelling Better ROM  Still looks bad  Current Outpatient Medications on File Prior to Visit  Medication Sig Dispense Refill  . ACCU-CHEK AVIVA PLUS test strip USE TO TEST BLOOD SUGAR ONCE DAILY DX: R73.03 100 each 3  . Accu-Chek Softclix Lancets lancets USE AS INSTRUCTED Dx Code R73.3 100 each 3  . acetaminophen (TYLENOL) 500 MG tablet Take 500 mg by mouth every 4 (four) hours as needed.    Marland Kitchen atorvastatin (LIPITOR) 10 MG tablet Take 1 tablet (10 mg total) by mouth once a week. 13 tablet 3  . Blood Glucose Monitoring Suppl (ACCU-CHEK AVIVA PLUS) w/Device KIT Use to test blood sugar once daily Dx: R73.03 1 kit 0  . Cholecalciferol (VITAMIN D3) 25 MCG (1000 UT) CAPS Take by mouth.    . Chromium Picolinate 1000 MCG TABS Take 1,000 mcg by mouth daily.     . Coenzyme Q10 (CO Q 10) 100 MG CAPS Take 100 mg by mouth daily.     . Digestive Enzymes CAPS Take 2 capsules by mouth.     . DULoxetine (CYMBALTA) 60 MG capsule TAKE 1 CAPSULE (60 MG TOTAL) BY MOUTH 2 (TWO) TIMES DAILY. 180 capsule 0  . gabapentin (NEURONTIN) 300 MG capsule Take 2 capsules by mouth daily and 3 capsules by mouth at bedtime.    Marland Kitchen lisinopril-hydrochlorothiazide (ZESTORETIC) 20-25 MG tablet TAKE 2 TABLETS BY MOUTH EVERY DAY 180 tablet 3  . meloxicam (MOBIC) 15 MG tablet Take 1 tablet (15 mg total) by mouth daily. 90 tablet 3  . metFORMIN (GLUCOPHAGE-XR) 500 MG 24 hr tablet TAKE 1 TABLET BY MOUTH EVERY DAY WITH  BREAKFAST 90 tablet 0  . Multiple Vitamins-Minerals (MULTIVITAMIN WITH MINERALS) tablet Take 1 tablet by mouth daily.     . Omega-3 Fatty Acids (FISH OIL) 1000 MG CAPS Take 2,000 mg by mouth.    Edythe Lynn Lecithin 1200 MG CAPS Take 1,200 mg by mouth 3 (three) times daily.     . traMADol (ULTRAM) 50 MG tablet Take 2 tablets (100 mg total) by mouth 3 (three) times daily as needed. 180 tablet 0  . vitamin C (ASCORBIC ACID) 500 MG tablet Take 500 mg by mouth daily.     No current facility-administered medications on file prior to visit.    No Known Allergies  Past Medical History:  Diagnosis Date  . Arthritis   . Confusion   . Diabetes mellitus without complication ([place])    type 2  . Hepatitis    pt. denies  . Hypertension   . Lumbar stenosis   . Memory loss   . Nocturia   . Numbness and tingling    legs and feet  . Wears glasses     Past Surgical History:  Procedure Laterality Date  . [place]  . COLONOSCOPY    . HEMORRHOID SURGERY  2015  . JOINT REPLACEMENT     01-08-18 Dr. Kathrynn Speed  . TOTAL HIP ARTHROPLASTY Left 01/08/2018  Procedure: LEFT TOTAL HIP ARTHROPLASTY ANTERIOR APPROACH;  Surgeon: Mcarthur Rossetti, MD;  Location: [facility];  Service: Orthopedics;  Laterality: Left;  . TRANSFORAMINAL LUMBAR INTERBODY FUSION (TLIF) WITH PEDICLE SCREW FIXATION 2 LEVEL N/A 11/07/2015   Procedure: Lumbar three-four Lumbar four-five  transforaminal lumbar interbody fusion with interbody prosthesis posterior lateral arthrodesis and posterior segmental instrumentation;  Surgeon: Newman Pies, MD;  Location: [place] [facility];  Service: Neurosurgery;  Laterality: N/A;    Family History  Problem Relation Age of Onset  . Hypertension Other   . Cancer Other        ovarian  . Cancer Mother   . Heart disease Father     Social History   Socioeconomic History  . Marital status: Divorced    Spouse name: Not on file  . Number of children: 4  . Years of education: Not on file   . Highest education level: Associate degree: occupational, Hotel manager, or vocational program  Occupational History  . Not on file  Tobacco Use  . Smoking status: Never Smoker  . Smokeless tobacco: Never Used  Substance and Sexual Activity  . Alcohol use: No    Comment: rarely  . Drug use: No  . Sexual activity: Not Currently  Other Topics Concern  . Not on file  Social History Narrative   Has a living will.   Son is health care POA   Would accept resuscitation.  Would not want prolonged life support if futile.   Probably would not want tube feeds if cognitively unaware   Social Determinants of Health   Financial Resource Strain:   . Difficulty of Paying Living Expenses: Not on file  Food Insecurity:   . Worried About Charity fundraiser in the Last Year: Not on file  . Ran Out of Food in the Last Year: Not on file  Transportation Needs:   . Lack of Transportation (Medical): Not on file  . Lack of Transportation (Non-Medical): Not on file  Physical Activity:   . Days of Exercise per Week: Not on file  . Minutes of Exercise per Session: Not on file  Stress:   . Feeling of Stress : Not on file  Social Connections:   . Frequency of Communication with Friends and Family: Not on file  . Frequency of Social Gatherings with Friends and Family: Not on file  . Attends Religious Services: Not on file  . Active Member of Clubs or Organizations: Not on file  . Attends Archivist Meetings: Not on file  . Marital Status: Not on file  Intimate Partner Violence:   . Fear of Current or Ex-Partner: Not on file  . Emotionally Abused: Not on file  . Physically Abused: Not on file  . Sexually Abused: Not on file   Review of Systems  No fever Some nausea--relates to the antibiotic No diarrhea--but some loose stools     Objective:   Physical Exam  Constitutional: No distress.  Skin:  Right 2nd finger is less swollen Flexor surface is almost back to normal--though middle  phalanx has a layer of devitalized skin Extensor surface is only pale red in proximal phalanx Open area on distal phalanx is smaller---slight bleeding           Assessment & Plan:

## 2019-08-29 NOTE — Assessment & Plan Note (Addendum)
Much better Will have some devitalized skin that will eventually come off (mostly in the middle phalanx) but still mild inflammation Not tolerating the augmentin at this point Will change to cephalexin to see if she tolerates this better Follow up prn only Will start silvadene topically as well

## 2019-08-29 NOTE — Addendum Note (Signed)
Addended by: Viviana Simpler I on: 08/29/2019 02:25 PM   Modules accepted: Orders

## 2019-09-05 NOTE — Telephone Encounter (Signed)
Pt attached a video of her right index finger. It looks a lot better. Her concern is that she cannot straighten her finger all the way. She is able to move the entire finger but the 1st joint will not straighten.

## 2019-09-20 MED ORDER — TRAMADOL HCL 50 MG PO TABS: 100.0000 mg | ORAL_TABLET | Freq: Three times a day (TID) | ORAL | 0 refills | Status: DC | PRN

## 2019-09-20 NOTE — Telephone Encounter (Signed)
Last written 08-09-19 #180 Last OV 08-29-19 Next OV 12-23-19 [facility]

## 2019-09-29 DIAGNOSIS — M24549 Contracture, unspecified hand: Secondary | ICD-10-CM

## 2019-10-06 NOTE — Telephone Encounter (Signed)
Pt called referral status. Please advise at [phone].

## 2019-10-06 NOTE — Telephone Encounter (Signed)
Pt aware of previous note and that we are waiting on whether Dr. Jeannie Fend will see her.

## 2019-10-06 NOTE — Telephone Encounter (Signed)
Please update her about where we stand with the consult. Thanks!

## 2019-10-06 NOTE — Telephone Encounter (Signed)
ER consulted w/Dr. Caralyn Guile, [facility].  Pt declined f/u visit on 08-19-19 w/Dr. Caralyn Guile.  Dr. Angus Palms next available appt is 10-25-19.  Dr. Jeannie Fend, [facility] currently has appt 10-11-19.  Their scheduler sent a msg to Dr. Jeannie Fend who is in surgery today.  He reads his msgs in between surgeries.  Will need Dr. Shelbie Ammons approval for appt.  Can't also book w/[email or web address]  time waiting for Dr. Gabriel Rainwater approval.

## 2019-10-07 NOTE — Telephone Encounter (Signed)
Dr. Jeannie Fend accepted pt.  Pt aware appt 10-14-19@1 :15 p.m.

## 2019-10-14 DIAGNOSIS — M79641 Pain in right hand: Secondary | ICD-10-CM | POA: Diagnosis not present

## 2019-10-18 ENCOUNTER — Other Ambulatory Visit: Payer: Self-pay | Admitting: Orthopaedic Surgery

## 2019-10-18 DIAGNOSIS — M79641 Pain in right hand: Secondary | ICD-10-CM

## 2019-10-26 ENCOUNTER — Encounter: Payer: Self-pay | Admitting: Internal Medicine

## 2019-10-26 DIAGNOSIS — M79641 Pain in right hand: Secondary | ICD-10-CM | POA: Diagnosis not present

## 2019-11-03 ENCOUNTER — Other Ambulatory Visit: Payer: Self-pay | Admitting: Internal Medicine

## 2019-11-03 DIAGNOSIS — F320 Major depressive disorder, single episode, mild: Secondary | ICD-10-CM

## 2019-11-04 DIAGNOSIS — M79644 Pain in right finger(s): Secondary | ICD-10-CM | POA: Diagnosis not present

## 2019-11-05 ENCOUNTER — Other Ambulatory Visit: Payer: Medicare Other

## 2019-11-10 ENCOUNTER — Ambulatory Visit (INDEPENDENT_AMBULATORY_CARE_PROVIDER_SITE_OTHER): Payer: Medicare Other | Admitting: Internal Medicine

## 2019-11-10 ENCOUNTER — Encounter: Payer: Self-pay | Admitting: Internal Medicine

## 2019-11-10 ENCOUNTER — Other Ambulatory Visit: Payer: Self-pay

## 2019-11-10 DIAGNOSIS — M869 Osteomyelitis, unspecified: Secondary | ICD-10-CM | POA: Insufficient documentation

## 2019-11-10 NOTE — Progress Notes (Signed)
[facility] for Infectious Disease  Reason for Consult: Right index finger infection Referring Provider: Dr. Viviana Simpler  Assessment: She initially had a paronychia due to MRSA and probably provoked deep septic arthritis and osteomyelitis by attempting to drain it at home with a pin.  Although she only received a short course of trimethoprim sulfamethoxazole during the initial phase of her infection I am not convinced that she has active infection at this time.  She does have some flexion contracture and MRI evidence of osteomyelitis but I am not sure that that represents active infection.  When I talked to her about the possibility of continuing antibiotic therapy she became very concerned and said that she only wanted to take half a dose of trimethoprim sulfamethoxazole for 2 to 3 days.  Given that her inflammatory markers are normal I suggested that we continue observation off of antibiotics for now and follow-up in a few weeks.  She is in agreement with that plan.   Plan: 1. Continue observation off of antibiotics for now 2. Follow-up in 2 weeks  Patient Active Problem List   Diagnosis Date Noted  . Osteomyelitis of finger of right hand ([place]) 11/10/2019    Priority: High  . Cellulitis of right index finger 08/22/2019  . Shoulder tendonitis, left 07/25/2019  . Chronic narcotic dependence ([place]) 07/04/2019  . Osteoarthritis of right knee 11/16/2018  . Urge incontinence 10/07/2018  . Mycotic toenails 10/07/2018  . Venous insufficiency 10/07/2018  . Unilateral primary osteoarthritis, left hip 01/08/2018  . Status post total replacement of left hip 01/08/2018  . Left hip pain 12/09/2017  . Pain syndrome, chronic 03/13/2017  . Facet arthropathy, lumbar 03/13/2017  . Advance directive discussed with patient 01/15/2016  . Spinal stenosis of lumbar region with neurogenic claudication 11/07/2015  . Abnormal EKG 11/05/2015  . Other intervertebral disc degeneration, lumbar  region 06/01/2015  . Chronic back pain 03/29/2015  . Preventative health care 01/10/2015  . Osteoarthritis, hip, bilateral 11/28/2014  . Peripheral neuropathy 11/28/2014  . Type 2 diabetes, controlled, with neuropathy ([place]) 01/20/2014  . Episodic mood disorder ([place]) 01/20/2014  . IBS (irritable bowel syndrome) 12/27/2012  . Essential hypertension, benign 08/06/2010    Patient's Medications  New Prescriptions   No medications on file  Previous Medications   ACCU-CHEK AVIVA PLUS TEST STRIP    USE TO TEST BLOOD SUGAR ONCE DAILY DX: R73.03   ACCU-CHEK SOFTCLIX LANCETS LANCETS    USE AS INSTRUCTED Dx Code R73.3   ACETAMINOPHEN (TYLENOL) 500 MG TABLET    Take 500 mg by mouth every 4 (four) hours as needed.   ATORVASTATIN (LIPITOR) 10 MG TABLET    Take 1 tablet (10 mg total) by mouth once a week.   BLOOD GLUCOSE MONITORING SUPPL (ACCU-CHEK AVIVA PLUS) W/DEVICE KIT    Use to test blood sugar once daily Dx: R73.03   CEPHALEXIN (KEFLEX) 500 MG CAPSULE    Take 1 capsule (500 mg total) by mouth 3 (three) times daily.   CHOLECALCIFEROL (VITAMIN D3) 25 MCG (1000 UT) CAPS    Take by mouth.   CHROMIUM PICOLINATE 1000 MCG TABS    Take 1,000 mcg by mouth daily.    COENZYME Q10 (CO Q 10) 100 MG CAPS    Take 100 mg by mouth daily.    DIGESTIVE ENZYMES CAPS    Take 2 capsules by mouth.    DULOXETINE (CYMBALTA) 60 MG CAPSULE    TAKE 1 CAPSULE (60 MG  TOTAL) BY MOUTH 2 (TWO) TIMES DAILY.   GABAPENTIN (NEURONTIN) 300 MG CAPSULE    Take 2 capsules by mouth daily and 3 capsules by mouth at bedtime.   LISINOPRIL-HYDROCHLOROTHIAZIDE (ZESTORETIC) 20-25 MG TABLET    TAKE 2 TABLETS BY MOUTH EVERY DAY   MELOXICAM (MOBIC) 15 MG TABLET    Take 1 tablet (15 mg total) by mouth daily.   METFORMIN (GLUCOPHAGE-XR) 500 MG 24 HR TABLET    TAKE 1 TABLET BY MOUTH EVERY DAY WITH BREAKFAST   MULTIPLE VITAMINS-MINERALS (MULTIVITAMIN WITH MINERALS) TABLET    Take 1 tablet by mouth daily.    OMEGA-3 FATTY ACIDS (FISH OIL) 1000 MG  CAPS    Take 2,000 mg by mouth.   SILVER SULFADIAZINE (SILVADENE) 1 % CREAM    Apply 1 application topically daily.   SOYA LECITHIN 1200 MG CAPS    Take 1,200 mg by mouth 3 (three) times daily.    TRAMADOL (ULTRAM) 50 MG TABLET    Take 2 tablets (100 mg total) by mouth 3 (three) times daily as needed.   VITAMIN C (ASCORBIC ACID) 500 MG TABLET    Take 500 mg by mouth daily.  Modified Medications   No medications on file  Discontinued Medications   No medications on file    HPI: [name] is a 74 y.o. female with history of diabetes and hypertension.  She developed a paronychia on her right index finger and November of last year.  She used a needle to drain the paronychia.  The pain redness and swelling got worse and she presented to the emergency department on 08/17/2019.  She underwent I&D by Dr. Gavin Pound.  She was treated with clindamycin in the ED and discharged on 10 days of trimethoprim sulfamethoxazole.  Cultures grew MRSA resistant to clindamycin and doxycycline but susceptible to trimethoprim sulfamethoxazole.  She failed to follow-up in the office with Dr. Apolonio Schneiders.   She was seen by Dr. Silvio Pate on 08/22/2019.  He noted that her finger looked better.  Gave her a dose of IM ceftriaxone and continue trimethoprim sulfamethoxazole.  She followed up the following day and he noted some drainage.  She received another dose of IM ceftriaxone and was changed to amoxicillin clavulanate.  The following day he noted that the finger "looks nasty".  She asked if she needed to be hospitalized.  She received third dose of IM ceftriaxone and continued amoxicillin clavulanate.  On follow-up the next 2 days she seemed to be doing better and she received similar treatment.  On 08/29/2019 he noted that her finger "still looks bad, devitalized skin will come off".  She said that she was not tolerating amoxicillin clavulanate so she was changed to oral cephalexin and topical Silvadene cream.  She followed up  with Dr. Harlin Heys in early February.  He was concerned about deep infection and ordered an MRI that was performed on 10/26/2019.  It showed diffuse marrow signal abnormality in the index finger at the distal interphalangeal joint with diffuse subcutaneous edema, suspicious for chronic osteomyelitis.  No abscess was noted.  When she followed up with him on 10/31/2019 he discussed the possibility of incision and debridement of bone to confirm the diagnosis of osteomyelitis and obtain cultures.  She did not want to pursue this and therefore was referred here today.  She denies having any pain and states that the only thing that bothers her is the fact that she cannot straighten her right index finger out like she  used to.    Review of Systems: Review of Systems  Constitutional: Negative for chills, diaphoresis, fever and weight loss.  Respiratory: Negative for cough and shortness of breath.   Cardiovascular: Negative for chest pain.  Gastrointestinal: Negative for abdominal pain, diarrhea, nausea and vomiting.  Musculoskeletal: Negative for joint pain.  Skin: Negative for rash.      Past Medical History:  Diagnosis Date  . Arthritis   . Confusion   . Diabetes mellitus without complication ([place])    type 2  . Hepatitis    pt. denies  . Hypertension   . Lumbar stenosis   . Memory loss   . Nocturia   . Numbness and tingling    legs and feet  . Wears glasses     Social History   Tobacco Use  . Smoking status: Never Smoker  . Smokeless tobacco: Never Used  Substance Use Topics  . Alcohol use: No    Comment: rarely  . Drug use: No    Family History  Problem Relation Age of Onset  . Hypertension Other   . Cancer Other        ovarian  . Cancer Mother   . Heart disease Father    No Known Allergies  OBJECTIVE: Vitals:   11/10/19 1358  BP: 138/83  Pulse: 94  Temp: (!) 97.5 F (36.4 C)  TempSrc: Oral  Weight: 179 lb (81.2 kg)   Body mass index is 33.82  kg/m.   Physical Exam Constitutional:      Comments: She is pleasant and in good spirits but seems to have a very quirky manner.  Cardiovascular:     Rate and Rhythm: Normal rate and regular rhythm.     Heart sounds: No murmur.  Pulmonary:     Effort: Pulmonary effort is normal.     Breath sounds: Normal breath sounds.  Abdominal:     Palpations: Abdomen is soft.     Tenderness: There is no abdominal tenderness.  Musculoskeletal:        General: Swelling present.     Comments: She has some mild diffuse swelling of right index finger with dusky erythema.  Her right index finger nailbed is detached.  She has some flexion contracture.  She has no drainage around the nailbed.  She has no tenderness with palpation.  She tells me that the only thing she has difficulty doing with her finger is "picking her nose".  Skin:    Findings: No rash.  Neurological:     General: No focal deficit present.  Psychiatric:        Mood and Affect: Mood normal.       Sed Rate (mm/h)  Date Value  11/10/2019 14   CRP (mg/L)  Date Value  11/10/2019 0.7    Microbiology: No results found for this or any previous visit (from the past 240 hour(s)).  Michel Bickers, MD [facility] for Infectious [facility] [phone] pager   [phone] cell 11/10/2019, 2:38 PM

## 2019-11-11 ENCOUNTER — Encounter: Payer: Self-pay | Admitting: Internal Medicine

## 2019-11-11 DIAGNOSIS — N181 Chronic kidney disease, stage 1: Secondary | ICD-10-CM | POA: Insufficient documentation

## 2019-11-11 LAB — BASIC METABOLIC PANEL
BUN/Creatinine Ratio: 25 (calc) — ABNORMAL HIGH (ref 6–22)
BUN: 31 mg/dL — ABNORMAL HIGH (ref 7–25)
CO2: 30 mmol/L (ref 20–32)
Calcium: 9.5 mg/dL (ref 8.6–10.4)
Chloride: 102 mmol/L (ref 98–110)
Creat: 1.25 mg/dL — ABNORMAL HIGH (ref 0.60–0.93)
Glucose, Bld: 142 mg/dL — ABNORMAL HIGH (ref 65–99)
Potassium: 4.4 mmol/L (ref 3.5–5.3)
Sodium: 139 mmol/L (ref 135–146)

## 2019-11-11 LAB — C-REACTIVE PROTEIN: CRP: 0.7 mg/L (ref <8.0)

## 2019-11-11 LAB — SEDIMENTATION RATE: Sed Rate: 14 mm/h (ref 0–30)

## 2019-11-11 NOTE — Assessment & Plan Note (Signed)
She initially had a paronychia due to MRSA and probably provoked deep septic arthritis and osteomyelitis by attempting to drain it at home with a pin.  Although she only received a short course of trimethoprim sulfamethoxazole during the initial phase of her infection I am not convinced that she has active infection at this time.  She does have some flexion contracture and MRI evidence of osteomyelitis but I am not sure that that represents active infection.  When I talked to her about the possibility of continuing antibiotic therapy she became very concerned and said that she only wanted to take half a dose of trimethoprim sulfamethoxazole for 2 to 3 days.  Given that her inflammatory markers are normal I suggested that we continue observation off of antibiotics for now and follow-up in a few weeks.  She is in agreement with that plan.

## 2019-11-15 ENCOUNTER — Other Ambulatory Visit: Payer: Self-pay | Admitting: Internal Medicine

## 2019-11-16 ENCOUNTER — Other Ambulatory Visit: Payer: Self-pay | Admitting: Internal Medicine

## 2019-11-22 ENCOUNTER — Other Ambulatory Visit: Payer: Self-pay | Admitting: Internal Medicine

## 2019-11-24 ENCOUNTER — Ambulatory Visit (INDEPENDENT_AMBULATORY_CARE_PROVIDER_SITE_OTHER): Payer: Medicare Other | Admitting: Internal Medicine

## 2019-11-24 ENCOUNTER — Other Ambulatory Visit: Payer: Self-pay

## 2019-11-24 DIAGNOSIS — M869 Osteomyelitis, unspecified: Secondary | ICD-10-CM | POA: Diagnosis not present

## 2019-11-24 NOTE — Progress Notes (Signed)
irtual Visit via Telephone Note  I connected with@ on 11/24/19 at  9:15 AM EDT by a telephone enabled telemedicine application and verified that I am speaking with the correct person using two identifiers.  Location: Patient: Home Provider: [facility]   I discussed the limitations of evaluation and management by telemedicine and the availability of in person appointments. The patient expressed understanding and agreed to proceed.  [facility] for Infectious Disease  Patient Active Problem List   Diagnosis Date Noted  . Osteomyelitis of finger of right hand ([place]) 11/10/2019    Priority: High  . Cellulitis of right index finger 08/22/2019    Priority: High  . Chronic renal insufficiency, stage 1 11/11/2019  . Shoulder tendonitis, left 07/25/2019  . Chronic narcotic dependence ([place]) 07/04/2019  . Osteoarthritis of right knee 11/16/2018  . Urge incontinence 10/07/2018  . Mycotic toenails 10/07/2018  . Venous insufficiency 10/07/2018  . Unilateral primary osteoarthritis, left hip 01/08/2018  . Status post total replacement of left hip 01/08/2018  . Left hip pain 12/09/2017  . Pain syndrome, chronic 03/13/2017  . Facet arthropathy, lumbar 03/13/2017  . Advance directive discussed with patient 01/15/2016  . Spinal stenosis of lumbar region with neurogenic claudication 11/07/2015  . Abnormal EKG 11/05/2015  . Other intervertebral disc degeneration, lumbar region 06/01/2015  . Chronic back pain 03/29/2015  . Preventative health care 01/10/2015  . Osteoarthritis, hip, bilateral 11/28/2014  . Peripheral neuropathy 11/28/2014  . Type 2 diabetes, controlled, with neuropathy ([place]) 01/20/2014  . Episodic mood disorder ([place]) 01/20/2014  . IBS (irritable bowel syndrome) 12/27/2012  . Essential hypertension, benign 08/06/2010    Patient's Medications  New Prescriptions   No medications on file  Previous Medications   ACCU-CHEK AVIVA PLUS TEST STRIP    USE TO TEST BLOOD SUGAR ONCE DAILY  DX: R73.03   ACCU-CHEK SOFTCLIX LANCETS LANCETS    USE AS INSTRUCTED DX CODE R73.3   ACETAMINOPHEN (TYLENOL) 500 MG TABLET    Take 500 mg by mouth every 4 (four) hours as needed.   ATORVASTATIN (LIPITOR) 10 MG TABLET    Take 1 tablet (10 mg total) by mouth once a week.   BLOOD GLUCOSE MONITORING SUPPL (ACCU-CHEK AVIVA PLUS) W/DEVICE KIT    Use to test blood sugar once daily Dx: R73.03   CEPHALEXIN (KEFLEX) 500 MG CAPSULE    Take 1 capsule (500 mg total) by mouth 3 (three) times daily.   CHOLECALCIFEROL (VITAMIN D3) 25 MCG (1000 UT) CAPS    Take by mouth.   CHROMIUM PICOLINATE 1000 MCG TABS    Take 1,000 mcg by mouth daily.    COENZYME Q10 (CO Q 10) 100 MG CAPS    Take 100 mg by mouth daily.    DIGESTIVE ENZYMES CAPS    Take 2 capsules by mouth.    DULOXETINE (CYMBALTA) 60 MG CAPSULE    TAKE 1 CAPSULE (60 MG TOTAL) BY MOUTH 2 (TWO) TIMES DAILY.   GABAPENTIN (NEURONTIN) 300 MG CAPSULE    TAKE TWO CAPSULES BY MOUTH TWICE A DAY TAKE 3 CAPSULES AT BEDTIME   LISINOPRIL-HYDROCHLOROTHIAZIDE (ZESTORETIC) 20-25 MG TABLET    TAKE 2 TABLETS BY MOUTH EVERY DAY   MELOXICAM (MOBIC) 15 MG TABLET    TAKE 1 TABLET BY MOUTH EVERY DAY   METFORMIN (GLUCOPHAGE-XR) 500 MG 24 HR TABLET    TAKE 1 TABLET BY MOUTH EVERY DAY WITH BREAKFAST   MULTIPLE VITAMINS-MINERALS (MULTIVITAMIN WITH MINERALS) TABLET    Take 1 tablet by mouth daily.  OMEGA-3 FATTY ACIDS (FISH OIL) 1000 MG CAPS    Take 2,000 mg by mouth.   SILVER SULFADIAZINE (SILVADENE) 1 % CREAM    Apply 1 application topically daily.   SOYA LECITHIN 1200 MG CAPS    Take 1,200 mg by mouth 3 (three) times daily.    TRAMADOL (ULTRAM) 50 MG TABLET    Take 2 tablets (100 mg total) by mouth 3 (three) times daily as needed.   VITAMIN C (ASCORBIC ACID) 500 MG TABLET    Take 500 mg by mouth daily.  Modified Medications   No medications on file  Discontinued Medications   No medications on file    History of Present Illness: I called and spoke with Ms. Fiallo today.   She has not had any change in the swelling of her right index finger.  There has been no increase in redness, pain or swelling.  She has not been doing any exercises to increase flexibility of her index finger.  Review of Systems  Constitutional: Negative for fever.  Musculoskeletal: Negative for joint pain.    Past Medical History:  Diagnosis Date  . Arthritis   . Confusion   . Diabetes mellitus without complication ([place])    type 2  . Hepatitis    pt. denies  . Hypertension   . Lumbar stenosis   . Memory loss   . Nocturia   . Numbness and tingling    legs and feet  . Wears glasses     Social History   Tobacco Use  . Smoking status: Never Smoker  . Smokeless tobacco: Never Used  Substance Use Topics  . Alcohol use: No    Comment: rarely  . Drug use: No    Family History  Problem Relation Age of Onset  . Hypertension Other   . Cancer Other        ovarian  . Cancer Mother   . Heart disease Father     No Known Allergies  Health Maintenance  Topic Date Due  . OPHTHALMOLOGY EXAM  11/18/2018  . HEMOGLOBIN A1C  01/22/2020  . FOOT EXAM  07/03/2020  . COLONOSCOPY  08/13/2020  . TETANUS/TDAP  08/16/2029  . INFLUENZA VACCINE  Completed  . DEXA SCAN  Completed  . Hepatitis C Screening  Completed  . PNA vac Low Risk Adult  Completed  . MAMMOGRAM  Discontinued    Observations/Objective: Sed Rate (mm/h)  Date Value  11/10/2019 14   CRP (mg/L)  Date Value  11/10/2019 0.7    Assessment and Plan: She remains stable without evidence of active right index finger infection.  Follow Up Instructions: 1. Continue observation off of antibiotics 2. I instructed her to proactively work on increasing flexibility of her frozen right index finger 3. She can follow-up here as needed   I discussed the assessment and treatment plan with the patient. The patient was provided an opportunity to ask questions and all were answered. The patient agreed with the plan and  demonstrated an understanding of the instructions.   The patient was advised to call back or seek an in-person evaluation if the symptoms worsen or if the condition fails to improve as anticipated.  I provided 8 minutes of non-face-to-face time during this encounter.  Michel Bickers, MD [facility] for Infectious [place] [facility] [phone] pager   [phone] cell 11/24/2019, 8:55 AM

## 2019-12-02 ENCOUNTER — Telehealth: Payer: Self-pay | Admitting: Orthopaedic Surgery

## 2019-12-02 NOTE — Telephone Encounter (Signed)
Patient called advised she is having pain in her right hip and needed to know if she needed an appointment. Patient said she spoke with Dr Ninfa Linden back in May 2020 and was told she had bursitis. Patient asked if it can be something else. Patient said she can not climb the stairs without holding onto the rail.  Patient said she has been hurting for a long time.  The number to contact patient is  [phone]

## 2019-12-02 NOTE — Telephone Encounter (Signed)
Patient worked in with [facility] next week

## 2019-12-05 ENCOUNTER — Ambulatory Visit (INDEPENDENT_AMBULATORY_CARE_PROVIDER_SITE_OTHER): Payer: Medicare Other | Admitting: Family Medicine

## 2019-12-05 ENCOUNTER — Ambulatory Visit: Payer: Self-pay

## 2019-12-05 ENCOUNTER — Other Ambulatory Visit: Payer: Self-pay

## 2019-12-05 ENCOUNTER — Encounter: Payer: Self-pay | Admitting: Family Medicine

## 2019-12-05 DIAGNOSIS — M25551 Pain in right hip: Secondary | ICD-10-CM

## 2019-12-05 NOTE — Progress Notes (Signed)
Office Visit Note   Patient: [name]           Date of Birth: [birth year]           MRN: [id number] Visit Date: 12/05/2019 Requested by: Venia Carbon, MD [place],  [place] [id number] PCP: Venia Carbon, MD  Subjective: Chief Complaint  Patient presents with  . Right Hip - Pain    Weakness and pain in the right hip (lateral/posterior). H/o THA on left by Dr. Ninfa Linden. That hip is not bothering her at all. Ambulates with rolling walker.    HPI: She is here with right hip pain.  Symptoms started several weeks ago.  She started feeling pain on the posterior lateral aspect, with some weakness.  She is using her walker for support.  She is status post left hip replacement.  She has diabetes which has been under suboptimal control.              ROS: No fever or chills.  All other systems were reviewed and are negative.  Objective: Vital Signs: There were no vitals taken for this visit.  Physical Exam:  General:  Alert and oriented, in no acute distress. Pulm:  Breathing unlabored. Psy:  Normal mood, congruent affect. Skin: No rash. Left hip: She is point tender over the greater trochanter.  She has pain with abduction against resistance.  Weakness with hip flexion.  Good internal rotation with no pain on passive movement.  Slight weakness with ankle eversion.  Remainder of strength is normal.  Imaging: X-rays right hip: Well-preserved joint space, no significant arthritic change, no sign of stress fracture or neoplasm.  Assessment & Plan: 1.  Right hip greater trochanter syndrome -Discussed options with her and elected to inject with cortisone.  She does not want physical therapy right now.  She will follow-up as needed.     Procedures: Right hip greater trochanter injection: After sterile prep with Betadine, injected 8 cc 1% lidocaine without epinephrine and 40 mg methylprednisolone into the area of maximum tenderness at the greater  trochanter.  Good immediate relief.    PMFS History: Patient Active Problem List   Diagnosis Date Noted  . Chronic renal insufficiency, stage 1 11/11/2019  . Osteomyelitis of finger of right hand ([place]) 11/10/2019  . Cellulitis of right index finger 08/22/2019  . Shoulder tendonitis, left 07/25/2019  . Chronic narcotic dependence ([place]) 07/04/2019  . Osteoarthritis of right knee 11/16/2018  . Urge incontinence 10/07/2018  . Mycotic toenails 10/07/2018  . Venous insufficiency 10/07/2018  . Unilateral primary osteoarthritis, left hip 01/08/2018  . Status post total replacement of left hip 01/08/2018  . Left hip pain 12/09/2017  . Pain syndrome, chronic 03/13/2017  . Facet arthropathy, lumbar 03/13/2017  . Advance directive discussed with patient 01/15/2016  . Spinal stenosis of lumbar region with neurogenic claudication 11/07/2015  . Abnormal EKG 11/05/2015  . Other intervertebral disc degeneration, lumbar region 06/01/2015  . Chronic back pain 03/29/2015  . Preventative health care 01/10/2015  . Osteoarthritis, hip, bilateral 11/28/2014  . Peripheral neuropathy 11/28/2014  . Type 2 diabetes, controlled, with neuropathy ([place]) 01/20/2014  . Episodic mood disorder ([place]) 01/20/2014  . IBS (irritable bowel syndrome) 12/27/2012  . Essential hypertension, benign 08/06/2010   Past Medical History:  Diagnosis Date  . Arthritis   . Confusion   . Diabetes mellitus without complication ([place])    type 2  . Hepatitis    pt. denies  .  Hypertension   . Lumbar stenosis   . Memory loss   . Nocturia   . Numbness and tingling    legs and feet  . Wears glasses     Family History  Problem Relation Age of Onset  . Hypertension Other   . Cancer Other        ovarian  . Cancer Mother   . Heart disease Father     Past Surgical History:  Procedure Laterality Date  . [place]  . COLONOSCOPY    . HEMORRHOID SURGERY  2015  . JOINT REPLACEMENT     01-08-18 Dr. Kathrynn Speed  .  TOTAL HIP ARTHROPLASTY Left 01/08/2018   Procedure: LEFT TOTAL HIP ARTHROPLASTY ANTERIOR APPROACH;  Surgeon: Mcarthur Rossetti, MD;  Location: [facility];  Service: Orthopedics;  Laterality: Left;  . TRANSFORAMINAL LUMBAR INTERBODY FUSION (TLIF) WITH PEDICLE SCREW FIXATION 2 LEVEL N/A 11/07/2015   Procedure: Lumbar three-four Lumbar four-five  transforaminal lumbar interbody fusion with interbody prosthesis posterior lateral arthrodesis and posterior segmental instrumentation;  Surgeon: Newman Pies, MD;  Location: [facility];  Service: Neurosurgery;  Laterality: N/A;   Social History   Occupational History  . Not on file  Tobacco Use  . Smoking status: Never Smoker  . Smokeless tobacco: Never Used  Substance and Sexual Activity  . Alcohol use: No    Comment: rarely  . Drug use: No  . Sexual activity: Not Currently

## 2019-12-07 ENCOUNTER — Other Ambulatory Visit: Payer: Self-pay | Admitting: Internal Medicine

## 2019-12-07 NOTE — Telephone Encounter (Signed)
Last filled 09-20-19 #180 Last OV 08-29-19 Next OV 12-23-19 [facility]

## 2019-12-23 ENCOUNTER — Other Ambulatory Visit: Payer: Self-pay

## 2019-12-23 ENCOUNTER — Ambulatory Visit (INDEPENDENT_AMBULATORY_CARE_PROVIDER_SITE_OTHER): Payer: Medicare HMO | Admitting: Internal Medicine

## 2019-12-23 ENCOUNTER — Encounter: Payer: Self-pay | Admitting: Internal Medicine

## 2019-12-23 VITALS — BP 138/100 | HR 86 | Temp 97.7°F | Resp 22 | Ht 61.0 in | Wt 175.5 lb

## 2019-12-23 DIAGNOSIS — M48062 Spinal stenosis, lumbar region with neurogenic claudication: Secondary | ICD-10-CM | POA: Diagnosis not present

## 2019-12-23 DIAGNOSIS — E1140 Type 2 diabetes mellitus with diabetic neuropathy, unspecified: Secondary | ICD-10-CM

## 2019-12-23 DIAGNOSIS — I10 Essential (primary) hypertension: Secondary | ICD-10-CM | POA: Diagnosis not present

## 2019-12-23 LAB — POCT GLYCOSYLATED HEMOGLOBIN (HGB A1C): Hemoglobin A1C: 6.1 % — AB (ref 4.0–5.6)

## 2019-12-23 NOTE — Assessment & Plan Note (Signed)
BP Readings from Last 3 Encounters:  12/23/19 (!) 138/100  11/10/19 138/83  08/29/19 122/76   Up a bit today--will just monitor Continue ACEI/HCTZ

## 2019-12-23 NOTE — Assessment & Plan Note (Signed)
Ongoing chronic pain Using the tramadol--asked her to try to limit to 4 daily if she can Off the meloxicam---may be worse (can save for prn on bad days)

## 2019-12-23 NOTE — Progress Notes (Signed)
Subjective:    Patient ID: [name], female    DOB: 03-19-1946, 74 y.o.   MRN: [id number]  HPI Here for recheck of diabetes and other chronic health conditions This visit occurred during the SARS-CoV-2 public health emergency.  Safety protocols were in place, including screening questions prior to the visit, additional usage of staff PPE, and extensive cleaning of exam room while observing appropriate contact time as indicated for disinfecting solutions.   Right 2nd finger is better but not normal Saw hand specialist and ID consultant Is going back to the hand specialist again No fevers, not sick  Sugars 96-115 in AM Once was 157 after candy the day before No hypoglycemic reactions  Ongoing neuropathy Now on gabapentin 600AM/1800bedtime Also on duloxetine Reading book---relates that NSAIDs cause "leaky gut" Stopped the meloxicam  Trying prevagen for memory  Back is about the same Takes tramadol 2 in Am and 2 in afternoon  Current Outpatient Medications on File Prior to Visit  Medication Sig Dispense Refill  . ACCU-CHEK AVIVA PLUS test strip USE TO TEST BLOOD SUGAR ONCE DAILY DX: R73.03 100 strip 3  . Accu-Chek Softclix Lancets lancets USE AS INSTRUCTED DX CODE R73.3 100 each 3  . acetaminophen (TYLENOL) 500 MG tablet Take 500 mg by mouth every 4 (four) hours as needed.    Marland Kitchen Apoaequorin (PREVAGEN PO) Take by mouth. daily    . atorvastatin (LIPITOR) 10 MG tablet Take 1 tablet (10 mg total) by mouth once a week. 13 tablet 3  . Blood Glucose Monitoring Suppl (ACCU-CHEK AVIVA PLUS) w/Device KIT Use to test blood sugar once daily Dx: R73.03 1 kit 0  . Cholecalciferol (VITAMIN D3) 25 MCG (1000 UT) CAPS Take by mouth.    . Chromium Picolinate 1000 MCG TABS Take 1,000 mcg by mouth daily.     . Coenzyme Q10 (CO Q 10) 100 MG CAPS Take 100 mg by mouth daily.     . Digestive Enzymes CAPS Take 2 capsules by mouth.     . DULoxetine (CYMBALTA) 60 MG capsule TAKE 1 CAPSULE (60 MG  TOTAL) BY MOUTH 2 (TWO) TIMES DAILY. 180 capsule 3  . gabapentin (NEURONTIN) 300 MG capsule TAKE TWO CAPSULES BY MOUTH TWICE A DAY TAKE 3 CAPSULES AT BEDTIME 630 capsule 4  . lisinopril-hydrochlorothiazide (ZESTORETIC) 20-25 MG tablet TAKE 2 TABLETS BY MOUTH EVERY DAY 180 tablet 3  . metFORMIN (GLUCOPHAGE-XR) 500 MG 24 hr tablet TAKE 1 TABLET BY MOUTH EVERY DAY WITH BREAKFAST 90 tablet 3  . Multiple Vitamins-Minerals (MULTIVITAMIN WITH MINERALS) tablet Take 1 tablet by mouth daily.     . Omega-3 Fatty Acids (FISH OIL) 1000 MG CAPS Take 2,000 mg by mouth.    . silver sulfADIAZINE (SILVADENE) 1 % cream Apply 1 application topically daily. 50 g 0  . Soya Lecithin 1200 MG CAPS Take 1,200 mg by mouth 3 (three) times daily.     . traMADol (ULTRAM) 50 MG tablet TAKE 2 TABLETS (100 MG TOTAL) BY MOUTH 3 (THREE) TIMES DAILY AS NEEDED. 180 tablet 0  . vitamin C (ASCORBIC ACID) 500 MG tablet Take 500 mg by mouth daily.     No current facility-administered medications on file prior to visit.    No Known Allergies  Past Medical History:  Diagnosis Date  . Arthritis   . Confusion   . Diabetes mellitus without complication ([place])    type 2  . Hepatitis    pt. denies  . Hypertension   . Lumbar  stenosis   . Memory loss   . Nocturia   . Numbness and tingling    legs and feet  . Wears glasses     Past Surgical History:  Procedure Laterality Date  . [place]  . COLONOSCOPY    . HEMORRHOID SURGERY  2015  . JOINT REPLACEMENT     01-08-18 Dr. Kathrynn Speed  . TOTAL HIP ARTHROPLASTY Left 01/08/2018   Procedure: LEFT TOTAL HIP ARTHROPLASTY ANTERIOR APPROACH;  Surgeon: Mcarthur Rossetti, MD;  Location: [facility];  Service: Orthopedics;  Laterality: Left;  . TRANSFORAMINAL LUMBAR INTERBODY FUSION (TLIF) WITH PEDICLE SCREW FIXATION 2 LEVEL N/A 11/07/2015   Procedure: Lumbar three-four Lumbar four-five  transforaminal lumbar interbody fusion with interbody prosthesis posterior lateral  arthrodesis and posterior segmental instrumentation;  Surgeon: Newman Pies, MD;  Location: [facility];  Service: Neurosurgery;  Laterality: N/A;    Family History  Problem Relation Age of Onset  . Hypertension Other   . Cancer Other        ovarian  . Cancer Mother   . Heart disease Father     Social History   Socioeconomic History  . Marital status: Divorced    Spouse name: Not on file  . Number of children: 4  . Years of education: Not on file  . Highest education level: Associate degree: occupational, Hotel manager, or vocational program  Occupational History  . Not on file  Tobacco Use  . Smoking status: Never Smoker  . Smokeless tobacco: Never Used  Substance and Sexual Activity  . Alcohol use: No    Comment: rarely  . Drug use: No  . Sexual activity: Not Currently  Other Topics Concern  . Not on file  Social History Narrative   Has a living will.   Son is health care POA   Would accept resuscitation.  Would not want prolonged life support if futile.   Probably would not want tube feeds if cognitively unaware   Social Determinants of Health   Financial Resource Strain:   . Difficulty of Paying Living Expenses:   Food Insecurity:   . Worried About Charity fundraiser in the Last Year:   . Arboriculturist in the Last Year:   Transportation Needs:   . Film/video editor (Medical):   Marland Kitchen Lack of Transportation (Non-Medical):   Physical Activity:   . Days of Exercise per Week:   . Minutes of Exercise per Session:   Stress:   . Feeling of Stress :   Social Connections:   . Frequency of Communication with Friends and Family:   . Frequency of Social Gatherings with Friends and Family:   . Attends Religious Services:   . Active Member of Clubs or Organizations:   . Attends Archivist Meetings:   Marland Kitchen Marital Status:   Intimate Partner Violence:   . Fear of Current or Ex-Partner:   . Emotionally Abused:   Marland Kitchen Physically Abused:   . Sexually Abused:      Review of Systems Sleep is variable---worse lately due to pain Appetite is okay Weight stable No chest pain or SOB    Objective:   Physical Exam  Constitutional: She appears well-developed. No distress.  Neck: No thyromegaly present.  Cardiovascular: Normal rate, regular rhythm, normal heart sounds and intact distal pulses. Exam reveals no gallop.  No murmur heard. Respiratory: Effort normal and breath sounds normal. No respiratory distress. She has no wheezes. She has no rales.  Musculoskeletal:        General: No edema.     Comments: Purplish discoloration in feet Has almost full extension of right 2nd finger  Lymphadenopathy:    She has no cervical adenopathy.  Skin:  No foot lesions  Psychiatric: She has a normal mood and affect. Her behavior is normal.           Assessment & Plan:

## 2019-12-23 NOTE — Assessment & Plan Note (Signed)
Lab Results  Component Value Date   HGBA1C 6.1 (A) 12/23/2019   Good control still Gabapentin and duloxetine for neuropathy

## 2019-12-27 LAB — HM DIABETES EYE EXAM

## 2020-01-07 ENCOUNTER — Other Ambulatory Visit: Payer: Self-pay | Admitting: Internal Medicine

## 2020-01-09 NOTE — Telephone Encounter (Signed)
Last filled 12-07-19 #180 Last OV 12-23-19 Next OV 07-04-20 [facility]

## 2020-01-13 ENCOUNTER — Encounter: Payer: Self-pay | Admitting: Internal Medicine

## 2020-02-02 DIAGNOSIS — I452 Bifascicular block: Secondary | ICD-10-CM | POA: Diagnosis not present

## 2020-02-02 DIAGNOSIS — I10 Essential (primary) hypertension: Secondary | ICD-10-CM | POA: Diagnosis not present

## 2020-02-02 DIAGNOSIS — E1140 Type 2 diabetes mellitus with diabetic neuropathy, unspecified: Secondary | ICD-10-CM | POA: Diagnosis not present

## 2020-02-02 DIAGNOSIS — I872 Venous insufficiency (chronic) (peripheral): Secondary | ICD-10-CM | POA: Diagnosis not present

## 2020-02-07 ENCOUNTER — Other Ambulatory Visit: Payer: Self-pay | Admitting: Internal Medicine

## 2020-02-07 DIAGNOSIS — Z20822 Contact with and (suspected) exposure to covid-19: Secondary | ICD-10-CM | POA: Diagnosis not present

## 2020-02-07 NOTE — Telephone Encounter (Signed)
Last filled 01-09-20 #180 Last OV 12-23-19 Next OV 07-04-20 [facility]

## 2020-02-08 DIAGNOSIS — R109 Unspecified abdominal pain: Secondary | ICD-10-CM

## 2020-02-25 ENCOUNTER — Emergency Department: Payer: Medicare Other

## 2020-02-25 ENCOUNTER — Emergency Department
Admission: EM | Admit: 2020-02-25 | Discharge: 2020-02-26 | Disposition: A | Discharge status: Home / Self Care | Payer: Medicare Other | Source: Home / Self Care

## 2020-02-25 ENCOUNTER — Emergency Department
Admission: EM | Admit: 2020-02-25 | Discharge: 2020-02-25 | Disposition: A | Discharge status: Home / Self Care | Payer: Medicare Other | Attending: Emergency Medicine | Admitting: Emergency Medicine

## 2020-02-25 ENCOUNTER — Other Ambulatory Visit: Payer: Self-pay

## 2020-02-25 DIAGNOSIS — R410 Disorientation, unspecified: Secondary | ICD-10-CM | POA: Insufficient documentation

## 2020-02-25 DIAGNOSIS — I10 Essential (primary) hypertension: Secondary | ICD-10-CM | POA: Insufficient documentation

## 2020-02-25 DIAGNOSIS — Y9389 Activity, other specified: Secondary | ICD-10-CM | POA: Insufficient documentation

## 2020-02-25 DIAGNOSIS — E119 Type 2 diabetes mellitus without complications: Secondary | ICD-10-CM | POA: Insufficient documentation

## 2020-02-25 DIAGNOSIS — Y999 Unspecified external cause status: Secondary | ICD-10-CM | POA: Diagnosis not present

## 2020-02-25 DIAGNOSIS — S01111A Laceration without foreign body of right eyelid and periocular area, initial encounter: Secondary | ICD-10-CM | POA: Diagnosis not present

## 2020-02-25 DIAGNOSIS — W1839XA Other fall on same level, initial encounter: Secondary | ICD-10-CM | POA: Insufficient documentation

## 2020-02-25 DIAGNOSIS — S0181XA Laceration without foreign body of other part of head, initial encounter: Secondary | ICD-10-CM

## 2020-02-25 DIAGNOSIS — R519 Headache, unspecified: Secondary | ICD-10-CM | POA: Insufficient documentation

## 2020-02-25 DIAGNOSIS — Z5321 Procedure and treatment not carried out due to patient leaving prior to being seen by health care provider: Secondary | ICD-10-CM | POA: Insufficient documentation

## 2020-02-25 DIAGNOSIS — Y929 Unspecified place or not applicable: Secondary | ICD-10-CM | POA: Insufficient documentation

## 2020-02-25 DIAGNOSIS — Z79899 Other long term (current) drug therapy: Secondary | ICD-10-CM | POA: Diagnosis not present

## 2020-02-25 DIAGNOSIS — S0083XA Contusion of other part of head, initial encounter: Secondary | ICD-10-CM

## 2020-02-25 DIAGNOSIS — Z7984 Long term (current) use of oral hypoglycemic drugs: Secondary | ICD-10-CM | POA: Insufficient documentation

## 2020-02-25 LAB — CBC
HCT: 45.9 % (ref 36.0–46.0)
Hemoglobin: 15.5 g/dL — ABNORMAL HIGH (ref 12.0–15.0)
MCH: 32.9 pg (ref 26.0–34.0)
MCHC: 33.8 g/dL (ref 30.0–36.0)
MCV: 97.5 fL (ref 80.0–100.0)
Platelets: 291 10*3/uL (ref 150–400)
RBC: 4.71 MIL/uL (ref 3.87–5.11)
RDW: 13.1 % (ref 11.5–15.5)
WBC: 15.9 10*3/uL — ABNORMAL HIGH (ref 4.0–10.5)
nRBC: 0 % (ref 0.0–0.2)

## 2020-02-25 LAB — COMPREHENSIVE METABOLIC PANEL
ALT: 18 U/L (ref 0–44)
AST: 25 U/L (ref 15–41)
Albumin: 3.8 g/dL (ref 3.5–5.0)
Alkaline Phosphatase: 47 U/L (ref 38–126)
Anion gap: 12 (ref 5–15)
BUN: 30 mg/dL — ABNORMAL HIGH (ref 8–23)
CO2: 23 mmol/L (ref 22–32)
Calcium: 9.1 mg/dL (ref 8.9–10.3)
Chloride: 102 mmol/L (ref 98–111)
Creatinine, Ser: 1.27 mg/dL — ABNORMAL HIGH (ref 0.44–1.00)
GFR calc Af Amer: 49 mL/min — ABNORMAL LOW (ref >60)
GFR calc non Af Amer: 42 mL/min — ABNORMAL LOW (ref >60)
Glucose, Bld: 192 mg/dL — ABNORMAL HIGH (ref 70–99)
Potassium: 3.2 mmol/L — ABNORMAL LOW (ref 3.5–5.1)
Sodium: 137 mmol/L (ref 135–145)
Total Bilirubin: 0.9 mg/dL (ref 0.3–1.2)
Total Protein: 6.6 g/dL (ref 6.5–8.1)

## 2020-02-25 MED ORDER — TRAMADOL HCL 50 MG PO TABS
50.0000 mg | ORAL_TABLET | Freq: Once | ORAL | Status: AC
  Administered 2020-02-25: 50 mg via ORAL
  Filled 2020-02-25: qty 1

## 2020-02-25 MED ORDER — LIDOCAINE HCL (PF) 1 % IJ SOLN
5.0000 mL | Freq: Once | INTRAMUSCULAR | Status: AC
  Administered 2020-02-25: 5 mL via INTRADERMAL
  Filled 2020-02-25: qty 5

## 2020-02-25 MED ORDER — LIDOCAINE HCL (PF) 1 % IJ SOLN
INTRAMUSCULAR | Status: AC
  Administered 2020-02-25: 5 mL
  Filled 2020-02-25: qty 5

## 2020-02-25 MED ORDER — BACITRACIN-NEOMYCIN-POLYMYXIN 400-5-5000 EX OINT
TOPICAL_OINTMENT | Freq: Once | CUTANEOUS | Status: AC
  Administered 2020-02-25: 1 via TOPICAL
  Filled 2020-02-25: qty 1

## 2020-02-25 MED ORDER — LIDOCAINE HCL (PF) 1 % IJ SOLN
5.0000 mL | Freq: Once | INTRAMUSCULAR | Status: AC
  Filled 2020-02-25: qty 5

## 2020-02-25 NOTE — ED Triage Notes (Signed)
Pt arrives to ER for a fall with head lac. Denies blood thinner use. Gauze applied with tape in triage. A&O, in wheelchair. Speaking in clear complete sentences. C/o chronic back pain.

## 2020-02-25 NOTE — ED Provider Notes (Signed)
[facility] Emergency Department Provider Note   ____________________________________________   First MD Initiated Contact with Patient 02/25/20 1139     (approximate)  I have reviewed the triage vital signs and the nursing notes.   HISTORY  Chief Complaint Fall  HPI [name] is a 74 y.o. female patient presents with a facial laceration secondary to a fall.  Patient states she lost her balance and fell.  Patient denies use of blood thinner.  Bleeding is controlled with direct pressure.  Patient denies LOC.  Patient rates the pain as a 6/10.  Patient described the pain is "achy".  Pressure dressing applied in triage.         Past Medical History:  Diagnosis Date  . Arthritis   . Confusion   . Diabetes mellitus without complication ([place])    type 2  . Hepatitis    pt. denies  . Hypertension   . Lumbar stenosis   . Memory loss   . Nocturia   . Numbness and tingling    legs and feet  . Wears glasses     Patient Active Problem List   Diagnosis Date Noted  . Chronic renal insufficiency, stage 1 11/11/2019  . Osteomyelitis of finger of right hand ([place]) 11/10/2019  . Cellulitis of right index finger 08/22/2019  . Shoulder tendonitis, left 07/25/2019  . Osteoarthritis of right knee 11/16/2018  . Urge incontinence 10/07/2018  . Mycotic toenails 10/07/2018  . Venous insufficiency 10/07/2018  . Unilateral primary osteoarthritis, left hip 01/08/2018  . Status post total replacement of left hip 01/08/2018  . Left hip pain 12/09/2017  . Pain syndrome, chronic 03/13/2017  . Facet arthropathy, lumbar 03/13/2017  . Advance directive discussed with patient 01/15/2016  . Spinal stenosis of lumbar region with neurogenic claudication 11/07/2015  . Abnormal EKG 11/05/2015  . Other intervertebral disc degeneration, lumbar region 06/01/2015  . Chronic back pain 03/29/2015  . Preventative health care 01/10/2015  . Osteoarthritis, hip, bilateral  11/28/2014  . Peripheral neuropathy 11/28/2014  . Type 2 diabetes, controlled, with neuropathy ([place]) 01/20/2014  . Episodic mood disorder ([place]) 01/20/2014  . IBS (irritable bowel syndrome) 12/27/2012  . Essential hypertension, benign 08/06/2010    Past Surgical History:  Procedure Laterality Date  . [place]  . COLONOSCOPY    . HEMORRHOID SURGERY  2015  . JOINT REPLACEMENT     01-08-18 Dr. Kathrynn Speed  . TOTAL HIP ARTHROPLASTY Left 01/08/2018   Procedure: LEFT TOTAL HIP ARTHROPLASTY ANTERIOR APPROACH;  Surgeon: Mcarthur Rossetti, MD;  Location: [facility];  Service: Orthopedics;  Laterality: Left;  . TRANSFORAMINAL LUMBAR INTERBODY FUSION (TLIF) WITH PEDICLE SCREW FIXATION 2 LEVEL N/A 11/07/2015   Procedure: Lumbar three-four Lumbar four-five  transforaminal lumbar interbody fusion with interbody prosthesis posterior lateral arthrodesis and posterior segmental instrumentation;  Surgeon: Newman Pies, MD;  Location: [place];  Service: Neurosurgery;  Laterality: N/A;    Prior to Admission medications   Medication Sig Start Date End Date Taking? Authorizing Provider  ACCU-CHEK AVIVA PLUS test strip USE TO TEST BLOOD SUGAR ONCE DAILY DX: R73.03 11/17/19   Viviana Simpler I, MD  Accu-Chek Softclix Lancets lancets USE AS INSTRUCTED DX CODE R73.3 11/22/19   Venia Carbon, MD  acetaminophen (TYLENOL) 500 MG tablet Take 500 mg by mouth every 4 (four) hours as needed.    [provider]  Apoaequorin (PREVAGEN PO) Take by mouth. daily    [provider]  atorvastatin (  LIPITOR) 10 MG tablet Take 1 tablet (10 mg total) by mouth once a week. 08/24/19   Venia Carbon, MD  Blood Glucose Monitoring Suppl (ACCU-CHEK AVIVA PLUS) w/Device KIT Use to test blood sugar once daily Dx: R73.03 07/10/16   Venia Carbon, MD  Cholecalciferol (VITAMIN D3) 25 MCG (1000 UT) CAPS Take by mouth.    [provider]  Chromium Picolinate 1000 MCG TABS Take 1,000 mcg by  mouth daily.     [provider]  Coenzyme Q10 (CO Q 10) 100 MG CAPS Take 100 mg by mouth daily.     [provider]  Digestive Enzymes CAPS Take 2 capsules by mouth.     [provider]  DULoxetine (CYMBALTA) 60 MG capsule TAKE 1 CAPSULE (60 MG TOTAL) BY MOUTH 2 (TWO) TIMES DAILY. 11/03/19   Venia Carbon, MD  gabapentin (NEURONTIN) 300 MG capsule TAKE TWO CAPSULES BY MOUTH TWICE A DAY TAKE 3 CAPSULES AT BEDTIME 11/15/19   Viviana Simpler I, MD  lisinopril-hydrochlorothiazide (ZESTORETIC) 20-25 MG tablet TAKE 2 TABLETS BY MOUTH EVERY DAY 08/10/19   Viviana Simpler I, MD  metFORMIN (GLUCOPHAGE-XR) 500 MG 24 hr tablet TAKE 1 TABLET BY MOUTH EVERY DAY WITH BREAKFAST 11/03/19   Venia Carbon, MD  Multiple Vitamins-Minerals (MULTIVITAMIN WITH MINERALS) tablet Take 1 tablet by mouth daily.     [provider]  Omega-3 Fatty Acids (FISH OIL) 1000 MG CAPS Take 2,000 mg by mouth.    [provider]  silver sulfADIAZINE (SILVADENE) 1 % cream Apply 1 application topically daily. 08/29/19   Venia Carbon, MD  Soya Lecithin 1200 MG CAPS Take 1,200 mg by mouth 3 (three) times daily.     [provider]  traMADol (ULTRAM) 50 MG tablet TAKE 2 TABLETS (100 MG TOTAL) BY MOUTH 3 (THREE) TIMES DAILY AS NEEDED. 02/07/20   Venia Carbon, MD  vitamin C (ASCORBIC ACID) 500 MG tablet Take 500 mg by mouth daily.    [provider]    Allergies Patient has no known allergies.  Family History  Problem Relation Age of Onset  . Hypertension Other   . Cancer Other        ovarian  . Cancer Mother   . Heart disease Father     Social History Social History   Tobacco Use  . Smoking status: Never Smoker  . Smokeless tobacco: Never Used  Vaping Use  . Vaping Use: Never used  Substance Use Topics  . Alcohol use: No    Comment: rarely  . Drug use: No    Review of Systems  Constitutional: No fever/chills Eyes: No visual changes. ENT: No  sore throat. Cardiovascular: Denies chest pain. Respiratory: Denies shortness of breath. Gastrointestinal: No abdominal pain.  No nausea, no vomiting.  No diarrhea.  No constipation. Genitourinary: Negative for dysuria. Musculoskeletal: Negative for back pain. Skin: Negative for rash. Neurological: Negative for headaches, focal weakness or numbness. Endocrine:  Diabetes and hypertension Hematological/Lymphatic:  Allergic/Immunilogical: **}  ____________________________________________   PHYSICAL EXAM:  VITAL SIGNS: ED Triage Vitals  Enc Vitals Group     BP 02/25/20 1113 (!) 151/99     Pulse Rate 02/25/20 1110 (!) 102     Resp 02/25/20 1110 20     Temp 02/25/20 1110 98.4 F (36.9 C)     Temp Source 02/25/20 1110 Oral     SpO2 02/25/20 1110 95 %     Weight 02/25/20 1110 171 lb (77.6 kg)  Height 02/25/20 1110 _0  (1.626 m)     Head Circumference --      Peak Flow --      Pain Score 02/25/20 1114 6     Pain Loc --      Pain Edu? --      Excl. in [place]? --    Constitutional: Alert and oriented. Well appearing and in no acute distress. Eyes: Conjunctivae are normal. PERRL. EOMI. Head: Atraumatic. Nose: No congestion/rhinnorhea. Mouth/Throat: Mucous membranes are moist.  Oropharynx non-erythematous. Neck: No cervical spine tenderness to palpation. Hematological/Lymphatic/Immunilogical: No cervical lymphadenopathy. Cardiovascular: Normal rate, regular rhythm. Grossly normal heart sounds.  Good peripheral circulation.  Elevated blood pressure Respiratory: Normal respiratory effort.  No retractions. Lungs CTAB. Gastrointestinal: Soft and nontender. No distention. No abdominal bruits. No CVA tenderness. Genitourinary: Deferred Neurologic:  Normal speech and language. No gross focal neurologic deficits are appreciated. No gait instability. Skin: 3 cm laceration above the right eyebrow. Psychiatric: Mood and affect are normal. Speech and behavior are  normal.  ____________________________________________   LABS (all labs ordered are listed, but only abnormal results are displayed)  Labs Reviewed - No data to display ____________________________________________  EKG   ____________________________________________  RADIOLOGY  ED MD interpretation:    Official radiology report(s): CT Maxillofacial Wo Contrast  Result Date: 02/25/2020 CLINICAL DATA:  Facial trauma laceration secondary to a fall. EXAM: CT MAXILLOFACIAL WITHOUT CONTRAST TECHNIQUE: Multidetector CT imaging of the maxillofacial structures was performed. Multiplanar CT image reconstructions were also generated. COMPARISON:  None. FINDINGS: Osseous: No fracture or mandibular dislocation. No destructive process. Orbits: Negative. No traumatic or inflammatory finding. Sinuses: Probable chronic mucosal thickening of the right maxillary sinus, anterior ethmoid air cells and right frontal sinus. Soft tissues: Laceration at the level of the right eyebrow. Otherwise normal. No hematoma. Limited intracranial: No acute intracranial abnormality. Mild age related atrophy. IMPRESSION: 1. Soft tissue  laceration at the level of the right eyebrow. 2. No other significant abnormality. Electronically Signed   By: Lorriane Shire M.D.   On: 02/25/2020 13:04    ____________________________________________   PROCEDURES  Procedure(s) performed (including Critical Care):  Marland KitchenMarland KitchenLaceration Repair  Date/Time: 02/25/2020 1:11 PM Performed by: Sable Feil, PA-C Authorized by: Sable Feil, PA-C   Consent:    Consent obtained:  Verbal   Consent given by:  Patient   Risks discussed:  Infection, pain, poor cosmetic result and need for additional repair Anesthesia (see MAR for exact dosages):    Anesthesia method:  Local infiltration   Local anesthetic:  Lidocaine 1% w/o epi Laceration details:    Location:  Face   Face location:  R eyebrow   Length (cm):  3 Repair type:    Repair  type:  Simple Pre-procedure details:    Preparation:  Patient was prepped and draped in usual sterile fashion Exploration:    Hemostasis achieved with:  Direct pressure   Contaminated: no   Treatment:    Area cleansed with:  Betadine and saline   Amount of cleaning:  Standard Skin repair:    Repair method:  Sutures   Suture size:  5-0   Suture material:  Nylon   Suture technique:  Simple interrupted   Number of sutures:  9 Approximation:    Approximation:  Close Post-procedure details:    Dressing:  Antibiotic ointment   Patient tolerance of procedure:  Tolerated well, no immediate complications     ____________________________________________   INITIAL IMPRESSION / ASSESSMENT AND PLAN / ED COURSE  As part of my medical decision making, I reviewed the following data within the [facility]     Patient presents with a laceration right supraorbital area secondary to a fall.  No LOC.  Discussed negative maxillofacial CT findings.  See procedure note for wound closure.  Patient given discharge care instruction advised return back in 5 days with suture removal.    [name] was evaluated in Emergency Department on 02/25/2020 for the symptoms described in the history of present illness. She was evaluated in the context of the global COVID-19 pandemic, which necessitated consideration that the patient might be at risk for infection with the SARS-CoV-2 virus that causes COVID-19. Institutional protocols and algorithms that pertain to the evaluation of patients at risk for COVID-19 are in a state of rapid change based on information released by regulatory bodies including the CDC and federal and state organizations. These policies and algorithms were followed during the patient's care in the ED.       ____________________________________________   FINAL CLINICAL IMPRESSION(S) / ED DIAGNOSES  Final diagnoses:  Facial laceration, initial encounter  Contusion of  face, initial encounter     ED Discharge Orders    None       Note:  This document was prepared using Dragon voice recognition software and may include unintentional dictation errors.    Sable Feil, PA-C 02/25/20 1313    Lavonia Drafts, MD 02/25/20 1446

## 2020-02-25 NOTE — ED Notes (Signed)
VS obtained by this RN. Pt can be heard on phone with family member stating, "Come get me". This RN once again apologized and explained delay to patient.

## 2020-02-25 NOTE — Discharge Instructions (Addendum)
Follow discharge care instructions and have sutures removed in 5 days.

## 2020-02-25 NOTE — ED Triage Notes (Signed)
Pt seen earlier for fall and hitting head with head lac. Pt checked back in for "possible stroke and not thinking clearly." speech clear, grip strength equal, no facial droop. States hasn't been thinking clearly since she fell at 9am today. No arm drift. No numbness or tingling. No blurred vision. A&O. In wheelchair. When asked if pt was dx with concussion earlier she said no. Pt is A&O, answering questions correctly. No neuro deficits noted.

## 2020-02-25 NOTE — ED Notes (Signed)
Patient states she "reached for something and didn't hit it" and fell over. Patient denies LOC.

## 2020-02-25 NOTE — ED Notes (Signed)
Upon checking back in to the ER pt stating she needed to use the restroom. Wheeled into the restroom by this Therapist, sports. Pt stated she needed to defecate. Ambulatory to toilet with 1 assist. Informed to pull red line when finished using restroom.

## 2020-02-25 NOTE — ED Notes (Signed)
Per Dr. Ellender Hose, perform CT head, basic blood work and UA.

## 2020-02-25 NOTE — ED Triage Notes (Signed)
First RN Note: pt seen earlier today for fall and head laceration and discharged. Per son pt was referred for possible stroke. Pt's son reports that patient having increased confusion from baseline. Pt alert and able to speak in clear and complete sentences, upon arrival to ED pt with clear speech noted at this time. Pt states "I don't feel like talking somebody should have called". This RN attempted to encourage patient to describe symptoms and pt became agitated stating "well what do you want to know?"

## 2020-02-25 NOTE — ED Notes (Signed)
Pt to desk to inquire about wait times, pt appears frustrated with wait times, this RN apologized, explained EDP would need to review results with patient. Pt appears in NAD at this time. Pt currently texting on personal cell phone at this time. This RN explained EDP would need to review results with her, pt states "well after an hour and 45 minutes it should be enough time". This RN explained providers seeing patients in the back and patient would be roomed as soon as possible.

## 2020-02-27 NOTE — Telephone Encounter (Signed)
If the other person doesn't come in at 33 tomorrow--you can schedule her. Otherwise, I am okay with adding her on at 12:30 on Wednesday

## 2020-02-27 NOTE — Telephone Encounter (Signed)
[facility] Night - Client TELEPHONE ADVICE RECORD [facility] Patient Name: [name] Gender: Female DOB: 01/07/1946 Age: 74 Y 9 M 24 D Return Phone Number: [phone] (Primary) Address: City/State/Zip: [name] [address] Client [facility] Night - Client Client Site [place] Physician Viviana Simpler - MD Contact Type Call Who Is Calling Patient / Member / Family / Caregiver Call Type Triage / Clinical Caller Name Kieth Brightly Relationship To Patient Care Giver Return Phone Number [phone] (Primary) Chief Complaint HEAD INJURY - and not acting right. Change in behaviour after hitting head. Reason for Call Symptomatic / Request for Health Information Initial Comment The patient fells this morning and hit their head, since then her behavior has changed. Translation No Nurse Assessment Nurse: Susy Manor, RN, Megan Date/Time (Eastern Time): 02/25/2020 3:15:35 PM Confirm and document reason for call. If symptomatic, describe symptoms. ---Caller states patient went to the bathroom this morning. Patient got dizzy and fell coming back. She hit her head. Patient went to the ER and she has been discharged. Patient is acting strange. Has the patient had close contact with a person known or suspected to have the novel coronavirus illness OR traveled / lives in area with major community spread (including international travel) in the last 14 days from the onset of symptoms? * If Asymptomatic, screen for exposure and travel within the last 14 days. ---No Does the patient have any new or worsening symptoms? ---Yes Will a triage be completed? ---Yes Related visit to physician within the last 2 weeks? ---No Does the PT have any chronic conditions? (i.e. diabetes, asthma, this includes High risk factors for pregnancy, etc.) ---Yes List chronic conditions. ---HTN Is this a behavioral health or substance abuse call?  ---No Guidelines Guideline Title Affirmed Question Affirmed Notes Nurse Date/Time (Eastern Time) Head Injury Sounds like a serious injury to the triager Susy Manor, RN, Megan 02/25/2020 3:18:29 PM Disp. Time Eilene Ghazi Time) Disposition Final User 02/25/2020 3:14:41 PM Send to Urgent Queue Wisdom, ZachPLEASE NOTE: All timestamps contained within this report are represented as [place] Standard Time. CONFIDENTIALTY NOTICE: This fax transmission is intended only for the addressee. It contains information that is legally privileged, confidential or otherwise protected from use or disclosure. If you are not the intended recipient, you are strictly prohibited from reviewing, disclosing, copying using or disseminating any of this information or taking any action in reliance on or regarding this information. If you have received this fax in error, please notify us immediately by telephone so that we can arrange for its return to [place]. Phone: [phone], Toll-Free: [phone], Fax: [phone] Page: 2 of 2 Call Id: [id number] 02/25/2020 3:22:08 PM Go to ED Now Yes Susy Manor, RN, Megan Caller Disagree/Comply Comply Caller Understands Yes PreDisposition Did not know what to do Care Advice Given Per Guideline GO TO ED NOW: ANOTHER ADULT SHOULD DRIVE: * It is better and safer if another adult drives instead of you. NOTHING BY MOUTH: * Do not allow any eating or drinking. * Also avoid pain medicines until seen. (Reason: condition may need surgery and general anesthesia). CARE ADVICE given per Head Injury (Adult) guideline. Referrals [facility] - ED

## 2020-02-27 NOTE — Telephone Encounter (Signed)
Patient was transferred to triage because of dizziness. Patient stated that she fell over the weekend and she thinks that she fell because of dizziness. Patient stated that she feels dizzy again today and would like to see Dr. Silvio Pate. Patient was advised that Dr. Silvio Pate is out of the office today. Patient was asked if she could check her blood pressure, but she does not have equipment to check it. Patient stated that her blood pressure was good when she was at the ER. Patient stated that she is going to check with her neighbor to see if she has a blood pressure kit that she can borrow. Patient stated that she has a bruise on her back where she fell over the weekend and may have cracked a rib. Patient denies any SOB. Patient requested that I call her back in about 15 minutes to give her time to see about getting her blood pressure checked.

## 2020-02-27 NOTE — Telephone Encounter (Addendum)
Spoke to patient by telephone and was advised that her blood pressure is 156/93 and 10 minutes later it was 147/93. Patient stated that she is not dizzy anymore and feels fine as long as she is sitting around. Patient did get up and walk around while on the phone and she was not having any dizziness.  Patient stated that she is having back pain from the fall. Patient wants to know if Dr. Silvio Pate will work her in to be seem tomorrow Tuesday 02/28/20 or what would he recommend for the back pain that she is having.

## 2020-02-27 NOTE — Telephone Encounter (Signed)
Patient called in regards to my chart message  She also stated she would like a call back to discuss a laceration that she has.  During phone call patient stated she needed to speak to the nurse about being dizzy.   Patient was sent over to [facility] for triage.

## 2020-02-27 NOTE — Telephone Encounter (Signed)
I spoke to patient.  I scheduled her appointment on 02/29/20 at 12:30.  I let her know if the other patient won't be coming to the appointment on 02/28/20 at 12:00, I'll call her and r/s appointment to 02/28/20 at 12:00.  I left a message for the other person's daughter to call and let me know if he'll be coming on Tuesday.

## 2020-02-28 ENCOUNTER — Encounter: Payer: Self-pay | Admitting: Internal Medicine

## 2020-02-28 ENCOUNTER — Other Ambulatory Visit: Payer: Self-pay

## 2020-02-28 ENCOUNTER — Ambulatory Visit (INDEPENDENT_AMBULATORY_CARE_PROVIDER_SITE_OTHER): Payer: Medicare Other | Admitting: Internal Medicine

## 2020-02-28 DIAGNOSIS — S0181XD Laceration without foreign body of other part of head, subsequent encounter: Secondary | ICD-10-CM

## 2020-02-28 DIAGNOSIS — S20229A Contusion of unspecified back wall of thorax, initial encounter: Secondary | ICD-10-CM | POA: Insufficient documentation

## 2020-02-28 DIAGNOSIS — R42 Dizziness and giddiness: Secondary | ICD-10-CM | POA: Diagnosis not present

## 2020-02-28 DIAGNOSIS — S0181XA Laceration without foreign body of other part of head, initial encounter: Secondary | ICD-10-CM | POA: Insufficient documentation

## 2020-02-28 DIAGNOSIS — S20221D Contusion of right back wall of thorax, subsequent encounter: Secondary | ICD-10-CM

## 2020-02-28 MED ORDER — ACCU-CHEK AVIVA PLUS W/DEVICE KIT: PACK | 0 refills | Status: DC

## 2020-02-28 MED ORDER — ACCU-CHEK SOFTCLIX LANCETS MISC: 3 refills | Status: DC

## 2020-02-28 MED ORDER — ACCU-CHEK AVIVA PLUS VI STRP: ORAL_STRIP | 3 refills | Status: DC

## 2020-02-28 NOTE — Assessment & Plan Note (Signed)
Looks clean and dry Will set up for suture removal in 3 days

## 2020-02-28 NOTE — Assessment & Plan Note (Signed)
Ongoing pain  Discussed typical time course of healing---2-3 weeks Recommended heating pad Has increased the tramadol (still allowable level)

## 2020-02-28 NOTE — Progress Notes (Signed)
Subjective:    Patient ID: [name], female    DOB: 1946-07-09, 74 y.o.   MRN: [id number]  HPI Here for ER follow up This visit occurred during the SARS-CoV-2 public health emergency.  Safety protocols were in place, including screening questions prior to the visit, additional usage of staff PPE, and extensive cleaning of exam room while observing appropriate contact time as indicated for disinfecting solutions.   She feels she was dizzy Around 10 AM when walking out of the bathroom---looked off to left Dizzy but didn't pass out Hit right shoulder and then head hit chair Reviewed ER records Laceration repaired CT head negative---also maxillofacial (no fracture)  Feels the dizziness is improving slowly Still using the walker at home and when out --but it didn't stop her fall (hand fell off the handle)  Back pain is bad Still on the tramadol--had cut to 4 per day but increased it to 6 per day  Current Outpatient Medications on File Prior to Visit  Medication Sig Dispense Refill  . acetaminophen (TYLENOL) 500 MG tablet Take 500 mg by mouth every 4 (four) hours as needed.    Marland Kitchen Apoaequorin (PREVAGEN PO) Take by mouth. daily    . atorvastatin (LIPITOR) 10 MG tablet Take 1 tablet (10 mg total) by mouth once a week. 13 tablet 3  . Cholecalciferol (VITAMIN D3) 25 MCG (1000 UT) CAPS Take by mouth.    . Chromium Picolinate 1000 MCG TABS Take 1,000 mcg by mouth daily.     . Coenzyme Q10 (CO Q 10) 100 MG CAPS Take 100 mg by mouth daily.     . Digestive Enzymes CAPS Take 2 capsules by mouth.     . DULoxetine (CYMBALTA) 60 MG capsule TAKE 1 CAPSULE (60 MG TOTAL) BY MOUTH 2 (TWO) TIMES DAILY. 180 capsule 3  . gabapentin (NEURONTIN) 300 MG capsule TAKE TWO CAPSULES BY MOUTH TWICE A DAY TAKE 3 CAPSULES AT BEDTIME 630 capsule 4  . lisinopril-hydrochlorothiazide (ZESTORETIC) 20-25 MG tablet TAKE 2 TABLETS BY MOUTH EVERY DAY 180 tablet 3  . metFORMIN (GLUCOPHAGE-XR) 500 MG 24 hr tablet TAKE 1  TABLET BY MOUTH EVERY DAY WITH BREAKFAST 90 tablet 3  . Multiple Vitamins-Minerals (MULTIVITAMIN WITH MINERALS) tablet Take 1 tablet by mouth daily.     . Omega-3 Fatty Acids (FISH OIL) 1000 MG CAPS Take 2,000 mg by mouth.    . silver sulfADIAZINE (SILVADENE) 1 % cream Apply 1 application topically as needed.    [name] 1200 MG CAPS Take 1,200 mg by mouth 3 (three) times daily.     . traMADol (ULTRAM) 50 MG tablet TAKE 2 TABLETS (100 MG TOTAL) BY MOUTH 3 (THREE) TIMES DAILY AS NEEDED. 180 tablet 0  . vitamin C (ASCORBIC ACID) 500 MG tablet Take 500 mg by mouth daily.     No current facility-administered medications on file prior to visit.    No Known Allergies  Past Medical History:  Diagnosis Date  . Arthritis   . Confusion   . Diabetes mellitus without complication ([place])    type 2  . Hepatitis    pt. denies  . Hypertension   . Lumbar stenosis   . Memory loss   . Nocturia   . Numbness and tingling    legs and feet  . Wears glasses     Past Surgical History:  Procedure Laterality Date  . [place]  . COLONOSCOPY    . HEMORRHOID SURGERY  2015  .  JOINT REPLACEMENT     01-08-18 Dr. Kathrynn Speed  . TOTAL HIP ARTHROPLASTY Left 01/08/2018   Procedure: LEFT TOTAL HIP ARTHROPLASTY ANTERIOR APPROACH;  Surgeon: Mcarthur Rossetti, MD;  Location: [facility];  Service: Orthopedics;  Laterality: Left;  . TRANSFORAMINAL LUMBAR INTERBODY FUSION (TLIF) WITH PEDICLE SCREW FIXATION 2 LEVEL N/A 11/07/2015   Procedure: Lumbar three-four Lumbar four-five  transforaminal lumbar interbody fusion with interbody prosthesis posterior lateral arthrodesis and posterior segmental instrumentation;  Surgeon: Newman Pies, MD;  Location: [place] [facility];  Service: Neurosurgery;  Laterality: N/A;    Family History  Problem Relation Age of Onset  . Hypertension Other   . Cancer Other        ovarian  . Cancer Mother   . Heart disease Father     Social History   Socioeconomic History   . Marital status: Divorced    Spouse name: Not on file  . Number of children: 4  . Years of education: Not on file  . Highest education level: Associate degree: occupational, Hotel manager, or vocational program  Occupational History  . Not on file  Tobacco Use  . Smoking status: Never Smoker  . Smokeless tobacco: Never Used  Vaping Use  . Vaping Use: Never used  Substance and Sexual Activity  . Alcohol use: No    Comment: rarely  . Drug use: No  . Sexual activity: Not Currently  Other Topics Concern  . Not on file  Social History Narrative   Has a living will.   Son is health care POA   Would accept resuscitation.  Would not want prolonged life support if futile.   Probably would not want tube feeds if cognitively unaware   Social Determinants of Health   Financial Resource Strain:   . Difficulty of Paying Living Expenses:   Food Insecurity:   . Worried About Charity fundraiser in the Last Year:   . Arboriculturist in the Last Year:   Transportation Needs:   . Film/video editor (Medical):   Marland Kitchen Lack of Transportation (Non-Medical):   Physical Activity:   . Days of Exercise per Week:   . Minutes of Exercise per Session:   Stress:   . Feeling of Stress :   Social Connections:   . Frequency of Communication with Friends and Family:   . Frequency of Social Gatherings with Friends and Family:   . Attends Religious Services:   . Active Member of Clubs or Organizations:   . Attends Archivist Meetings:   Marland Kitchen Marital Status:   Intimate Partner Violence:   . Fear of Current or Ex-Partner:   . Emotionally Abused:   Marland Kitchen Physically Abused:   . Sexually Abused:    Review of Systems No headache No nausea or vomiting (only nausea if she takes AM pills without eating) Sleeps okay    Objective:   Physical Exam  Constitutional: No distress.  Respiratory: Effort normal and breath sounds normal. She has no wheezes. She has no rales.  Neurological: She is alert.    Walks hunched over walker No focal weakness           Assessment & Plan:

## 2020-02-28 NOTE — Assessment & Plan Note (Signed)
Dizzy after turning and fell No syncope Not really orthostatic No change in meds

## 2020-02-29 ENCOUNTER — Ambulatory Visit: Payer: Medicare Other | Admitting: Internal Medicine

## 2020-03-02 ENCOUNTER — Encounter: Payer: Self-pay | Admitting: Internal Medicine

## 2020-03-02 ENCOUNTER — Ambulatory Visit (INDEPENDENT_AMBULATORY_CARE_PROVIDER_SITE_OTHER): Payer: Medicare Other | Admitting: Internal Medicine

## 2020-03-02 ENCOUNTER — Other Ambulatory Visit: Payer: Self-pay

## 2020-03-02 DIAGNOSIS — S0181XD Laceration without foreign body of other part of head, subsequent encounter: Secondary | ICD-10-CM

## 2020-03-02 NOTE — Assessment & Plan Note (Signed)
9 sutures removed Some difficulty on the most medial 2 (tight)--but able to cut with pointed scalpel Tolerated well Discussed home care No infection No need for steristrips

## 2020-03-02 NOTE — Progress Notes (Signed)
Subjective:    Patient ID: [name], female    DOB: 1946/08/23, 74 y.o.   MRN: [id number]  HPI Here for suture removal This visit occurred during the SARS-CoV-2 public health emergency.  Safety protocols were in place, including screening questions prior to the visit, additional usage of staff PPE, and extensive cleaning of exam room while observing appropriate contact time as indicated for disinfecting solutions.   Feels better Dizziness is gone No pain at laceration site No discharge  Current Outpatient Medications on File Prior to Visit  Medication Sig Dispense Refill  . Accu-Chek Softclix Lancets lancets Use as instructed 100 each 3  . acetaminophen (TYLENOL) 500 MG tablet Take 500 mg by mouth every 4 (four) hours as needed.    Marland Kitchen Apoaequorin (PREVAGEN PO) Take by mouth. daily    . atorvastatin (LIPITOR) 10 MG tablet Take 1 tablet (10 mg total) by mouth once a week. 13 tablet 3  . Blood Glucose Monitoring Suppl (ACCU-CHEK AVIVA PLUS) w/Device KIT Use to test blood sugar once daily Dx: R73.03 1 kit 0  . Cholecalciferol (VITAMIN D3) 25 MCG (1000 UT) CAPS Take by mouth.    . Chromium Picolinate 1000 MCG TABS Take 1,000 mcg by mouth daily.     . Coenzyme Q10 (CO Q 10) 100 MG CAPS Take 100 mg by mouth daily.     . Digestive Enzymes CAPS Take 2 capsules by mouth.     . DULoxetine (CYMBALTA) 60 MG capsule TAKE 1 CAPSULE (60 MG TOTAL) BY MOUTH 2 (TWO) TIMES DAILY. 180 capsule 3  . gabapentin (NEURONTIN) 300 MG capsule TAKE TWO CAPSULES BY MOUTH TWICE A DAY TAKE 3 CAPSULES AT BEDTIME 630 capsule 4  . glucose blood (ACCU-CHEK AVIVA PLUS) test strip USE TO TEST BLOOD SUGAR ONCE DAILY DX: R73.03 100 strip 3  . lisinopril-hydrochlorothiazide (ZESTORETIC) 20-25 MG tablet TAKE 2 TABLETS BY MOUTH EVERY DAY 180 tablet 3  . metFORMIN (GLUCOPHAGE-XR) 500 MG 24 hr tablet TAKE 1 TABLET BY MOUTH EVERY DAY WITH BREAKFAST 90 tablet 3  . Multiple Vitamins-Minerals (MULTIVITAMIN WITH MINERALS) tablet  Take 1 tablet by mouth daily.     . Omega-3 Fatty Acids (FISH OIL) 1000 MG CAPS Take 2,000 mg by mouth.    . silver sulfADIAZINE (SILVADENE) 1 % cream Apply 1 application topically as needed.    Edythe Lynn Lecithin 1200 MG CAPS Take 1,200 mg by mouth 3 (three) times daily.     . traMADol (ULTRAM) 50 MG tablet TAKE 2 TABLETS (100 MG TOTAL) BY MOUTH 3 (THREE) TIMES DAILY AS NEEDED. 180 tablet 0  . vitamin C (ASCORBIC ACID) 500 MG tablet Take 500 mg by mouth daily.     No current facility-administered medications on file prior to visit.    No Known Allergies  Past Medical History:  Diagnosis Date  . Arthritis   . Confusion   . Diabetes mellitus without complication ([place])    type 2  . Hepatitis    pt. denies  . Hypertension   . Lumbar stenosis   . Memory loss   . Nocturia   . Numbness and tingling    legs and feet  . Wears glasses     Past Surgical History:  Procedure Laterality Date  . [place]  . COLONOSCOPY    . HEMORRHOID SURGERY  2015  . JOINT REPLACEMENT     01-08-18 Dr. Kathrynn Speed  . TOTAL HIP ARTHROPLASTY Left 01/08/2018   Procedure: LEFT  TOTAL HIP ARTHROPLASTY ANTERIOR APPROACH;  Surgeon: Mcarthur Rossetti, MD;  Location: [facility];  Service: Orthopedics;  Laterality: Left;  . TRANSFORAMINAL LUMBAR INTERBODY FUSION (TLIF) WITH PEDICLE SCREW FIXATION 2 LEVEL N/A 11/07/2015   Procedure: Lumbar three-four Lumbar four-five  transforaminal lumbar interbody fusion with interbody prosthesis posterior lateral arthrodesis and posterior segmental instrumentation;  Surgeon: Newman Pies, MD;  Location: [place] [facility];  Service: Neurosurgery;  Laterality: N/A;    Family History  Problem Relation Age of Onset  . Hypertension Other   . Cancer Other        ovarian  . Cancer Mother   . Heart disease Father     Social History   Socioeconomic History  . Marital status: Divorced    Spouse name: Not on file  . Number of children: 4  . Years of education: Not on file   . Highest education level: Associate degree: occupational, Hotel manager, or vocational program  Occupational History  . Not on file  Tobacco Use  . Smoking status: Never Smoker  . Smokeless tobacco: Never Used  Vaping Use  . Vaping Use: Never used  Substance and Sexual Activity  . Alcohol use: No    Comment: rarely  . Drug use: No  . Sexual activity: Not Currently  Other Topics Concern  . Not on file  Social History Narrative   Has a living will.   Son is health care POA   Would accept resuscitation.  Would not want prolonged life support if futile.   Probably would not want tube feeds if cognitively unaware   Social Determinants of Health   Financial Resource Strain:   . Difficulty of Paying Living Expenses:   Food Insecurity:   . Worried About Charity fundraiser in the Last Year:   . Arboriculturist in the Last Year:   Transportation Needs:   . Film/video editor (Medical):   Marland Kitchen Lack of Transportation (Non-Medical):   Physical Activity:   . Days of Exercise per Week:   . Minutes of Exercise per Session:   Stress:   . Feeling of Stress :   Social Connections:   . Frequency of Communication with Friends and Family:   . Frequency of Social Gatherings with Friends and Family:   . Attends Religious Services:   . Active Member of Clubs or Organizations:   . Attends Archivist Meetings:   Marland Kitchen Marital Status:   Intimate Partner Violence:   . Fear of Current or Ex-Partner:   . Emotionally Abused:   Marland Kitchen Physically Abused:   . Sexually Abused:    Review of Systems     Objective:   Physical Exam         Assessment & Plan:

## 2020-03-07 ENCOUNTER — Other Ambulatory Visit: Payer: Self-pay | Admitting: Internal Medicine

## 2020-03-07 MED ORDER — ONETOUCH VERIO FLEX SYSTEM W/DEVICE KIT: 1.0000 | PACK | Freq: Once | 0 refills | Status: AC

## 2020-03-07 MED ORDER — ONETOUCH VERIO VI STRP: ORAL_STRIP | 12 refills | Status: DC

## 2020-03-07 NOTE — Telephone Encounter (Signed)
Last filled 02-07-20 #180 Last OV 03-02-20 Next OV 07-04-20 [facility]

## 2020-04-02 ENCOUNTER — Ambulatory Visit: Payer: Medicare HMO | Admitting: Gastroenterology

## 2020-04-06 ENCOUNTER — Other Ambulatory Visit: Payer: Self-pay | Admitting: Internal Medicine

## 2020-04-06 NOTE — Telephone Encounter (Signed)
Last filled 03-07-20 #180 Last OV 03-02-20 Next OV 07-04-20 [facility]

## 2020-04-25 NOTE — Telephone Encounter (Signed)
Pt wanted to let you know she lowered the duloxetine only taking in am  It doesn't seem to do anything for her.   Pt saw on internet that tramdol and duloxetine should not be taken together and she is stopping duloxetine.  She got this information from drugs.com

## 2020-05-07 ENCOUNTER — Other Ambulatory Visit: Payer: Self-pay | Admitting: Internal Medicine

## 2020-05-07 NOTE — Telephone Encounter (Signed)
Last office visit 03/02/2020 for follow up fall.  Last refilled 04/06/2020 for #180 with no refills.  CPE scheduled for 07/04/2020.

## 2020-05-10 ENCOUNTER — Encounter: Payer: Self-pay | Admitting: Internal Medicine

## 2020-05-10 ENCOUNTER — Other Ambulatory Visit: Payer: Self-pay

## 2020-05-10 ENCOUNTER — Ambulatory Visit (INDEPENDENT_AMBULATORY_CARE_PROVIDER_SITE_OTHER): Payer: Medicare Other | Admitting: Internal Medicine

## 2020-05-10 DIAGNOSIS — L03116 Cellulitis of left lower limb: Secondary | ICD-10-CM

## 2020-05-10 MED ORDER — AMOXICILLIN-POT CLAVULANATE 875-125 MG PO TABS
1.0000 | ORAL_TABLET | Freq: Two times a day (BID) | ORAL | 1 refills | Status: DC
Start: 2020-05-10 — End: 2020-06-10

## 2020-05-10 NOTE — Progress Notes (Signed)
Subjective:    Patient ID: [name], female    DOB: Jan 10, 1946, 74 y.o.   MRN: [id number]  HPI Here due to concerns about infected foot This visit occurred during the SARS-CoV-2 public health emergency.  Safety protocols were in place, including screening questions prior to the visit, additional usage of staff PPE, and extensive cleaning of exam room while observing appropriate contact time as indicated for disinfecting solutions.   Fell 5 days ago on sidewalk---scraped the side of lateral left foot Got up on her own---went home Didn't notice much at first--though slight bleeding (scraped) Has gotten a bit more red--putting on antiseptic/bandage  Tender but not painful otherwise No discharge  Current Outpatient Medications on File Prior to Visit  Medication Sig Dispense Refill  . Accu-Chek Softclix Lancets lancets Use as instructed 100 each 3  . acetaminophen (TYLENOL) 500 MG tablet Take 500 mg by mouth every 4 (four) hours as needed.    . Cholecalciferol (VITAMIN D3) 25 MCG (1000 UT) CAPS Take by mouth.    . Chromium Picolinate 1000 MCG TABS Take 1,000 mcg by mouth daily.     . Coenzyme Q10 (CO Q 10) 100 MG CAPS Take 100 mg by mouth daily.     . Digestive Enzymes CAPS Take 2 capsules by mouth.     . gabapentin (NEURONTIN) 300 MG capsule TAKE TWO CAPSULES BY MOUTH TWICE A DAY TAKE 3 CAPSULES AT BEDTIME 630 capsule 4  . glucose blood (ONETOUCH VERIO) test strip Use to check blood sugar once a day Dx Code E11.40 100 each 12  . lisinopril-hydrochlorothiazide (ZESTORETIC) 20-25 MG tablet TAKE 2 TABLETS BY MOUTH EVERY DAY 180 tablet 3  . metFORMIN (GLUCOPHAGE-XR) 500 MG 24 hr tablet TAKE 1 TABLET BY MOUTH EVERY DAY WITH BREAKFAST 90 tablet 3  . Multiple Vitamins-Minerals (MULTIVITAMIN WITH MINERALS) tablet Take 1 tablet by mouth daily.     . Omega-3 Fatty Acids (FISH OIL) 1000 MG CAPS Take 2,000 mg by mouth.    . silver sulfADIAZINE (SILVADENE) 1 % cream Apply 1 application  topically as needed.    [name] 1200 MG CAPS Take 1,200 mg by mouth 3 (three) times daily.     . traMADol (ULTRAM) 50 MG tablet TAKE 2 TABLETS (100 MG TOTAL) BY MOUTH 3 (THREE) TIMES DAILY AS NEEDED. 180 tablet 0  . vitamin C (ASCORBIC ACID) 500 MG tablet Take 500 mg by mouth daily.     No current facility-administered medications on file prior to visit.    No Known Allergies  Past Medical History:  Diagnosis Date  . Arthritis   . Confusion   . Diabetes mellitus without complication ([place])    type 2  . Hepatitis    pt. denies  . Hypertension   . Lumbar stenosis   . Memory loss   . Nocturia   . Numbness and tingling    legs and feet  . Wears glasses     Past Surgical History:  Procedure Laterality Date  . [place]  . COLONOSCOPY    . HEMORRHOID SURGERY  2015  . JOINT REPLACEMENT     01-08-18 Dr. Kathrynn Speed  . TOTAL HIP ARTHROPLASTY Left 01/08/2018   Procedure: LEFT TOTAL HIP ARTHROPLASTY ANTERIOR APPROACH;  Surgeon: Mcarthur Rossetti, MD;  Location: [facility];  Service: Orthopedics;  Laterality: Left;  . TRANSFORAMINAL LUMBAR INTERBODY FUSION (TLIF) WITH PEDICLE SCREW FIXATION 2 LEVEL N/A 11/07/2015   Procedure: Lumbar three-four Lumbar four-five  transforaminal lumbar interbody fusion with interbody prosthesis posterior lateral arthrodesis and posterior segmental instrumentation;  Surgeon: Newman Pies, MD;  Location: [place] [facility];  Service: Neurosurgery;  Laterality: N/A;    Family History  Problem Relation Age of Onset  . Hypertension Other   . Cancer Other        ovarian  . Cancer Mother   . Heart disease Father     Social History   Socioeconomic History  . Marital status: Divorced    Spouse name: Not on file  . Number of children: 4  . Years of education: Not on file  . Highest education level: Associate degree: occupational, Hotel manager, or vocational program  Occupational History  . Not on file  Tobacco Use  . Smoking status: Never  Smoker  . Smokeless tobacco: Never Used  Vaping Use  . Vaping Use: Never used  Substance and Sexual Activity  . Alcohol use: No    Comment: rarely  . Drug use: No  . Sexual activity: Not Currently  Other Topics Concern  . Not on file  Social History Narrative   Has a living will.   Son is health care POA   Would accept resuscitation.  Would not want prolonged life support if futile.   Probably would not want tube feeds if cognitively unaware   Social Determinants of Health   Financial Resource Strain:   . Difficulty of Paying Living Expenses: Not on file  Food Insecurity:   . Worried About Charity fundraiser in the Last Year: Not on file  . Ran Out of Food in the Last Year: Not on file  Transportation Needs:   . Lack of Transportation (Medical): Not on file  . Lack of Transportation (Non-Medical): Not on file  Physical Activity:   . Days of Exercise per Week: Not on file  . Minutes of Exercise per Session: Not on file  Stress:   . Feeling of Stress : Not on file  Social Connections:   . Frequency of Communication with Friends and Family: Not on file  . Frequency of Social Gatherings with Friends and Family: Not on file  . Attends Religious Services: Not on file  . Active Member of Clubs or Organizations: Not on file  . Attends Archivist Meetings: Not on file  . Marital Status: Not on file  Intimate Partner Violence:   . Fear of Current or Ex-Partner: Not on file  . Emotionally Abused: Not on file  . Physically Abused: Not on file  . Sexually Abused: Not on file   Review of Systems No fever Slight nausea in general--no vomiting    Objective:   Physical Exam Constitutional:      Appearance: Normal appearance.  Skin:    Comments: Partial granulation over abraded areas on dorsum of left foot at base of 4th and 5th toes Redness and tenderness directly around them and into 5th toe  Calves have venous congestion--dusky appearance but symmetric    Neurological:     Mental Status: She is alert.            Assessment & Plan:

## 2020-05-10 NOTE — Telephone Encounter (Signed)
I left a detailed message on patient's voice mail to call and schedule appointment with Dr.Letvak today at 4:30.

## 2020-05-10 NOTE — Telephone Encounter (Signed)
Please call her and see if she can come in at 4:30 to be seen

## 2020-05-10 NOTE — Assessment & Plan Note (Signed)
Will treat with augmentin for 5 days She will continue local care

## 2020-05-25 ENCOUNTER — Other Ambulatory Visit: Payer: Self-pay | Admitting: Neurology

## 2020-05-25 ENCOUNTER — Other Ambulatory Visit (HOSPITAL_COMMUNITY): Payer: Self-pay | Admitting: Neurology

## 2020-05-25 DIAGNOSIS — M5136 Other intervertebral disc degeneration, lumbar region: Secondary | ICD-10-CM | POA: Diagnosis not present

## 2020-05-25 DIAGNOSIS — E538 Deficiency of other specified B group vitamins: Secondary | ICD-10-CM | POA: Diagnosis not present

## 2020-05-25 DIAGNOSIS — Z7189 Other specified counseling: Secondary | ICD-10-CM | POA: Diagnosis not present

## 2020-05-25 DIAGNOSIS — R4189 Other symptoms and signs involving cognitive functions and awareness: Secondary | ICD-10-CM

## 2020-05-25 DIAGNOSIS — E519 Thiamine deficiency, unspecified: Secondary | ICD-10-CM | POA: Diagnosis not present

## 2020-05-25 DIAGNOSIS — N1832 Chronic kidney disease, stage 3b: Secondary | ICD-10-CM

## 2020-06-07 ENCOUNTER — Other Ambulatory Visit: Payer: Self-pay | Admitting: Nephrology

## 2020-06-07 ENCOUNTER — Other Ambulatory Visit: Payer: Self-pay | Admitting: Internal Medicine

## 2020-06-07 DIAGNOSIS — N1832 Chronic kidney disease, stage 3b: Secondary | ICD-10-CM

## 2020-06-07 DIAGNOSIS — I10 Essential (primary) hypertension: Secondary | ICD-10-CM | POA: Diagnosis not present

## 2020-06-07 DIAGNOSIS — R600 Localized edema: Secondary | ICD-10-CM | POA: Diagnosis not present

## 2020-06-07 DIAGNOSIS — E1122 Type 2 diabetes mellitus with diabetic chronic kidney disease: Secondary | ICD-10-CM

## 2020-06-07 NOTE — Telephone Encounter (Signed)
Last filled 05-08-20-21 #180 Last OV 05-10-20 Next OV 07-04-20 [facility]

## 2020-06-08 ENCOUNTER — Ambulatory Visit (HOSPITAL_COMMUNITY)
Admission: RE | Admit: 2020-06-08 | Discharge: 2020-06-08 | Disposition: A | Discharge status: Home / Self Care | Payer: Medicare Other | Source: Ambulatory Visit | Attending: Neurology | Admitting: Neurology

## 2020-06-08 DIAGNOSIS — R4189 Other symptoms and signs involving cognitive functions and awareness: Secondary | ICD-10-CM | POA: Diagnosis not present

## 2020-06-08 DIAGNOSIS — I6782 Cerebral ischemia: Secondary | ICD-10-CM | POA: Diagnosis not present

## 2020-06-08 DIAGNOSIS — G9389 Other specified disorders of brain: Secondary | ICD-10-CM | POA: Diagnosis not present

## 2020-06-08 DIAGNOSIS — J320 Chronic maxillary sinusitis: Secondary | ICD-10-CM | POA: Diagnosis not present

## 2020-06-08 DIAGNOSIS — G3184 Mild cognitive impairment, so stated: Secondary | ICD-10-CM | POA: Diagnosis not present

## 2020-06-10 ENCOUNTER — Other Ambulatory Visit: Payer: Self-pay

## 2020-06-10 ENCOUNTER — Observation Stay: Payer: Medicare Other

## 2020-06-10 ENCOUNTER — Observation Stay
Admission: EM | Admit: 2020-06-10 | Discharge: 2020-06-11 | Disposition: A | Discharge status: Home / Self Care | Payer: Medicare Other | Attending: Hospitalist | Admitting: Hospitalist

## 2020-06-10 ENCOUNTER — Encounter: Payer: Self-pay | Admitting: Emergency Medicine

## 2020-06-10 DIAGNOSIS — I130 Hypertensive heart and chronic kidney disease with heart failure and stage 1 through stage 4 chronic kidney disease, or unspecified chronic kidney disease: Secondary | ICD-10-CM | POA: Insufficient documentation

## 2020-06-10 DIAGNOSIS — E1129 Type 2 diabetes mellitus with other diabetic kidney complication: Secondary | ICD-10-CM | POA: Diagnosis present

## 2020-06-10 DIAGNOSIS — R413 Other amnesia: Secondary | ICD-10-CM | POA: Diagnosis not present

## 2020-06-10 DIAGNOSIS — Z20822 Contact with and (suspected) exposure to covid-19: Secondary | ICD-10-CM | POA: Diagnosis not present

## 2020-06-10 DIAGNOSIS — Z96642 Presence of left artificial hip joint: Secondary | ICD-10-CM | POA: Insufficient documentation

## 2020-06-10 DIAGNOSIS — R410 Disorientation, unspecified: Secondary | ICD-10-CM

## 2020-06-10 DIAGNOSIS — G3184 Mild cognitive impairment, so stated: Secondary | ICD-10-CM | POA: Diagnosis present

## 2020-06-10 DIAGNOSIS — E1122 Type 2 diabetes mellitus with diabetic chronic kidney disease: Secondary | ICD-10-CM | POA: Diagnosis not present

## 2020-06-10 DIAGNOSIS — Z7984 Long term (current) use of oral hypoglycemic drugs: Secondary | ICD-10-CM | POA: Insufficient documentation

## 2020-06-10 DIAGNOSIS — N1831 Chronic kidney disease, stage 3a: Secondary | ICD-10-CM | POA: Diagnosis not present

## 2020-06-10 DIAGNOSIS — I639 Cerebral infarction, unspecified: Principal | ICD-10-CM | POA: Diagnosis present

## 2020-06-10 DIAGNOSIS — Z79899 Other long term (current) drug therapy: Secondary | ICD-10-CM | POA: Insufficient documentation

## 2020-06-10 DIAGNOSIS — I10 Essential (primary) hypertension: Secondary | ICD-10-CM | POA: Diagnosis not present

## 2020-06-10 DIAGNOSIS — I5032 Chronic diastolic (congestive) heart failure: Secondary | ICD-10-CM | POA: Diagnosis not present

## 2020-06-10 DIAGNOSIS — R9389 Abnormal findings on diagnostic imaging of other specified body structures: Secondary | ICD-10-CM | POA: Diagnosis not present

## 2020-06-10 DIAGNOSIS — N1832 Chronic kidney disease, stage 3b: Secondary | ICD-10-CM | POA: Diagnosis present

## 2020-06-10 LAB — RESPIRATORY PANEL BY RT PCR (FLU A&B, COVID)
Influenza A by PCR: NEGATIVE
Influenza B by PCR: NEGATIVE
SARS Coronavirus 2 by RT PCR: NEGATIVE

## 2020-06-10 LAB — COMPREHENSIVE METABOLIC PANEL
ALT: 13 U/L (ref 0–44)
AST: 17 U/L (ref 15–41)
Albumin: 3.6 g/dL (ref 3.5–5.0)
Alkaline Phosphatase: 55 U/L (ref 38–126)
Anion gap: 10 (ref 5–15)
BUN: 29 mg/dL — ABNORMAL HIGH (ref 8–23)
CO2: 30 mmol/L (ref 22–32)
Calcium: 8.9 mg/dL (ref 8.9–10.3)
Chloride: 99 mmol/L (ref 98–111)
Creatinine, Ser: 1.18 mg/dL — ABNORMAL HIGH (ref 0.44–1.00)
GFR calc Af Amer: 53 mL/min — ABNORMAL LOW (ref >60)
GFR calc non Af Amer: 46 mL/min — ABNORMAL LOW (ref >60)
Glucose, Bld: 124 mg/dL — ABNORMAL HIGH (ref 70–99)
Potassium: 3.9 mmol/L (ref 3.5–5.1)
Sodium: 139 mmol/L (ref 135–145)
Total Bilirubin: 0.7 mg/dL (ref 0.3–1.2)
Total Protein: 6.8 g/dL (ref 6.5–8.1)

## 2020-06-10 LAB — CBC WITH DIFFERENTIAL/PLATELET
Abs Immature Granulocytes: 0.04 10*3/uL (ref 0.00–0.07)
Basophils Absolute: 0 10*3/uL (ref 0.0–0.1)
Basophils Relative: 0 %
Eosinophils Absolute: 0 10*3/uL (ref 0.0–0.5)
Eosinophils Relative: 0 %
HCT: 41.1 % (ref 36.0–46.0)
Hemoglobin: 14 g/dL (ref 12.0–15.0)
Immature Granulocytes: 0 %
Lymphocytes Relative: 15 %
Lymphs Abs: 1.4 10*3/uL (ref 0.7–4.0)
MCH: 33.6 pg (ref 26.0–34.0)
MCHC: 34.1 g/dL (ref 30.0–36.0)
MCV: 98.6 fL (ref 80.0–100.0)
Monocytes Absolute: 0.6 10*3/uL (ref 0.1–1.0)
Monocytes Relative: 7 %
Neutro Abs: 7.2 10*3/uL (ref 1.7–7.7)
Neutrophils Relative %: 78 %
Platelets: 274 10*3/uL (ref 150–400)
RBC: 4.17 MIL/uL (ref 3.87–5.11)
RDW: 13 % (ref 11.5–15.5)
WBC: 9.3 10*3/uL (ref 4.0–10.5)
nRBC: 0 % (ref 0.0–0.2)

## 2020-06-10 LAB — URINALYSIS, COMPLETE (UACMP) WITH MICROSCOPIC
Bilirubin Urine: NEGATIVE
Glucose, UA: NEGATIVE mg/dL
Hgb urine dipstick: NEGATIVE
Ketones, ur: NEGATIVE mg/dL
Leukocytes,Ua: NEGATIVE
Nitrite: NEGATIVE
Protein, ur: NEGATIVE mg/dL
Specific Gravity, Urine: 1.005 (ref 1.005–1.030)
pH: 7 (ref 5.0–8.0)

## 2020-06-10 MED ORDER — GABAPENTIN 300 MG PO CAPS: 600.0000 mg | ORAL_CAPSULE | ORAL | Status: DC

## 2020-06-10 MED ORDER — ACETAMINOPHEN 325 MG PO TABS: 650.0000 mg | ORAL_TABLET | ORAL | Status: DC | PRN

## 2020-06-10 MED ORDER — CLOPIDOGREL BISULFATE 75 MG PO TABS
75.0000 mg | ORAL_TABLET | Freq: Every day | ORAL | Status: DC
  Administered 2020-06-11: 75 mg via ORAL
  Filled 2020-06-10: qty 1

## 2020-06-10 MED ORDER — CLOPIDOGREL BISULFATE 75 MG PO TABS
300.0000 mg | ORAL_TABLET | Freq: Once | ORAL | Status: AC
  Administered 2020-06-10: 300 mg via ORAL
  Filled 2020-06-10: qty 4

## 2020-06-10 MED ORDER — ENOXAPARIN SODIUM 40 MG/0.4ML ~~LOC~~ SOLN: 40.0000 mg | SUBCUTANEOUS | Status: DC

## 2020-06-10 MED ORDER — VITAMIN D 25 MCG (1000 UNIT) PO TABS
1000.0000 [IU] | ORAL_TABLET | Freq: Every day | ORAL | Status: DC
  Filled 2020-06-10: qty 1

## 2020-06-10 MED ORDER — TRAMADOL HCL 50 MG PO TABS
100.0000 mg | ORAL_TABLET | Freq: Once | ORAL | Status: AC
  Administered 2020-06-10: 100 mg via ORAL
  Filled 2020-06-10: qty 2

## 2020-06-10 MED ORDER — INSULIN ASPART 100 UNIT/ML ~~LOC~~ SOLN: 0.0000 [IU] | Freq: Every day | SUBCUTANEOUS | Status: DC

## 2020-06-10 MED ORDER — TRAMADOL HCL 50 MG PO TABS: 100.0000 mg | ORAL_TABLET | Freq: Three times a day (TID) | ORAL | Status: DC | PRN

## 2020-06-10 MED ORDER — FUROSEMIDE 40 MG PO TABS: 20.0000 mg | ORAL_TABLET | Freq: Every day | ORAL | Status: DC | PRN

## 2020-06-10 MED ORDER — ASCORBIC ACID 500 MG PO TABS
500.0000 mg | ORAL_TABLET | Freq: Every day | ORAL | Status: DC
  Filled 2020-06-10: qty 1

## 2020-06-10 MED ORDER — LISINOPRIL-HYDROCHLOROTHIAZIDE 20-25 MG PO TABS: 2.0000 | ORAL_TABLET | Freq: Every day | ORAL | Status: DC

## 2020-06-10 MED ORDER — INSULIN ASPART 100 UNIT/ML ~~LOC~~ SOLN: 0.0000 [IU] | Freq: Three times a day (TID) | SUBCUTANEOUS | Status: DC

## 2020-06-10 MED ORDER — STROKE: EARLY STAGES OF RECOVERY BOOK: Freq: Once | Status: DC

## 2020-06-10 MED ORDER — HYDRALAZINE HCL 20 MG/ML IJ SOLN
5.0000 mg | INTRAMUSCULAR | Status: DC | PRN
  Administered 2020-06-10: 5 mg via INTRAVENOUS
  Filled 2020-06-10: qty 1

## 2020-06-10 MED ORDER — ONDANSETRON HCL 4 MG PO TABS: 4.0000 mg | ORAL_TABLET | Freq: Four times a day (QID) | ORAL | Status: DC | PRN

## 2020-06-10 MED ORDER — CHROMIUM PICOLINATE 1000 MCG PO TABS: 1000.0000 ug | ORAL_TABLET | Freq: Every day | ORAL | Status: DC

## 2020-06-10 MED ORDER — ACETAMINOPHEN 325 MG PO TABS: 650.0000 mg | ORAL_TABLET | Freq: Four times a day (QID) | ORAL | Status: DC | PRN

## 2020-06-10 MED ORDER — ASPIRIN EC 81 MG PO TBEC
81.0000 mg | DELAYED_RELEASE_TABLET | Freq: Every day | ORAL | Status: DC
  Administered 2020-06-10 – 2020-06-11 (×2): 81 mg via ORAL
  Filled 2020-06-10 (×2): qty 1

## 2020-06-10 MED ORDER — HYDROCHLOROTHIAZIDE 50 MG PO TABS
50.0000 mg | ORAL_TABLET | Freq: Every day | ORAL | Status: DC
  Filled 2020-06-10: qty 1

## 2020-06-10 MED ORDER — CO Q 10 100 MG PO CAPS: 100.0000 mg | ORAL_CAPSULE | Freq: Every day | ORAL | Status: DC

## 2020-06-10 MED ORDER — ATORVASTATIN CALCIUM 20 MG PO TABS
80.0000 mg | ORAL_TABLET | Freq: Every day | ORAL | Status: DC
  Administered 2020-06-10: 80 mg via ORAL
  Filled 2020-06-10 (×2): qty 4

## 2020-06-10 MED ORDER — ACETAMINOPHEN 650 MG RE SUPP: 650.0000 mg | RECTAL | Status: DC | PRN

## 2020-06-10 MED ORDER — OMEGA-3-ACID ETHYL ESTERS 1 G PO CAPS
1.0000 g | ORAL_CAPSULE | Freq: Every day | ORAL | Status: DC
  Filled 2020-06-10 (×2): qty 1

## 2020-06-10 MED ORDER — SENNOSIDES-DOCUSATE SODIUM 8.6-50 MG PO TABS: 1.0000 | ORAL_TABLET | Freq: Every evening | ORAL | Status: DC | PRN

## 2020-06-10 MED ORDER — ACETAMINOPHEN 160 MG/5ML PO SOLN
650.0000 mg | ORAL | Status: DC | PRN
  Filled 2020-06-10: qty 20.3

## 2020-06-10 MED ORDER — SOYA LECITHIN 1200 MG PO CAPS: 1200.0000 mg | ORAL_CAPSULE | Freq: Three times a day (TID) | ORAL | Status: DC

## 2020-06-10 MED ORDER — ADULT MULTIVITAMIN W/MINERALS CH: 1.0000 | ORAL_TABLET | Freq: Every day | ORAL | Status: DC

## 2020-06-10 MED ORDER — DIGESTIVE ENZYMES PO CAPS: 2.0000 | ORAL_CAPSULE | Freq: Every day | ORAL | Status: DC

## 2020-06-10 MED ORDER — LISINOPRIL 10 MG PO TABS
40.0000 mg | ORAL_TABLET | Freq: Every day | ORAL | Status: DC
  Administered 2020-06-11: 40 mg via ORAL
  Filled 2020-06-10: qty 4

## 2020-06-10 NOTE — Progress Notes (Signed)
PHARMACIST - PHYSICIAN ORDER COMMUNICATION  CONCERNING: P&T Medication Policy on Herbal Medications  DESCRIPTION:  This patient's order for:  Chromium, CoQ10, "Digestive Enzymes", and Soy Lecithin  have been noted.  This product(s) is classified as an "herbal" or natural product. Due to a lack of definitive safety studies or FDA approval, nonstandard manufacturing practices, plus the potential risk of unknown drug-drug interactions while on inpatient medications, the Pharmacy and Therapeutics Committee does not permit the use of "herbal" or natural products of this type within [facility].   ACTION TAKEN: The pharmacy department is unable to verify this order at this time and your patient has been informed of this safety policy. Please reevaluate patient's clinical condition at discharge and address if the herbal or natural product(s) should be resumed at that time.  Lu Duffel, PharmD, BCPS Clinical Pharmacist 06/10/2020 9:19 PM

## 2020-06-10 NOTE — ED Notes (Signed)
Call bell answered pt requested lab results and given, pt requested "heating pad" warm blanket given

## 2020-06-10 NOTE — Consult Note (Signed)
Neurology Consultation Reason for Consult: Possible stroke Referring Physician: Mora Bellman  CC: Memory loss  History is obtained from: Patient  HPI: [name] is a 74 y.o. female who was recently evaluated by Dr. Manuella Ghazi with [name] she has noticed changes that have been progressive over the past few months.  She notes that she feels like when she is writing, she has trouble coming up with the words, and will occasionally write the wrong word.  As part of her work-up with her outpatient neurologist she had an MRI ordered which was done on 10/1.  There is a  hyperintensity on diffusion imaging which is read as possible stroke versus artifact and therefore she was referred to the emergency department for stroke work-up.  She denies any recent acute changes including numbness, weakness, vision change or other symptoms.   LKW: Unclear tpa given?: no, unclear time of onset    ROS: A 14 point ROS was performed and is negative except as noted in the HPI.  Past Medical History:  Diagnosis Date  . Arthritis   . Confusion   . Diabetes mellitus without complication ([place])    type 2  . Hepatitis    pt. denies  . Hypertension   . Lumbar stenosis   . Memory loss   . Nocturia   . Numbness and tingling    legs and feet  . Wears glasses      Family History  Problem Relation Age of Onset  . Hypertension Other   . Cancer Other        ovarian  . Cancer Mother   . Heart disease Father      Social History:  reports that she has never smoked. She has never used smokeless tobacco. She reports that she does not drink alcohol and does not use drugs.   Exam: Current vital signs: BP (!) 158/102   Pulse 72   Temp 98.4 F (36.9 C) (Oral)   Resp 16   Ht 5\' 1"  (1.549 m)   Wt 79.4 kg   SpO2 95%   BMI 33.07 kg/m  Vital signs in last 24 hours: Temp:  [98.4 F (36.9 C)] 98.4 F (36.9 C) (10/03 0926) Pulse Rate:  [72-85] 72 (10/03 1400) Resp:  [16-18] 16 (10/03  1400) BP: (127-158)/(60-102) 158/102 (10/03 1400) SpO2:  [95 %] 95 % (10/03 1400) Weight:  [79.4 kg] 79.4 kg (10/03 0926)   Physical Exam  Constitutional: Appears well-developed and well-nourished.  Psych: Affect appropriate to situation Eyes: No scleral injection HENT: No OP obstrucion MSK: no joint deformities.  Cardiovascular: Normal rate and regular rhythm.  Respiratory: Effort normal, non-labored breathing GI: Soft.  No distension. There is no tenderness.  Skin: WDI  Neuro: Mental Status: Patient is awake, alert, oriented to person, place, month, year, and situation. Patient is able to give a clear and coherent history. No signs of aphasia or neglect Cranial Nerves: II: Visual Fields are full. Pupils are equal, round, and reactive to light.   III,IV, VI: EOMI without ptosis or diploplia.  V: Facial sensation is symmetric to temperature VII: She appears to have a very subtle decreased nasolabial fold on the left. VIII: hearing is intact to voice X: Uvula elevates symmetrically XI: Shoulder shrug is symmetric. XII: tongue is midline without atrophy or fasciculations.  Motor: Tone is normal. Bulk is normal. 5/5 strength was present in all four extremities.  Sensory: Sensation is symmetric to light touch and temperature in the arms and  legs. Deep Tendon Reflexes: 2+ and symmetric in the biceps and patellae.  Cerebellar: FNF  intact bilaterally   I have reviewed labs in epic and the results pertinent to this consultation are: Creatinine 1.1  From outside records: TSH 1.16 B12 greater than 1500 Folate greater than 22 B1 180 Treponemal antibodies nonreactive   I have reviewed the images obtained: MRI brain-fairly extensive chronic microischemic changes as well as previous locations.  There is a hyperintensity on axial diffusion, which does appear dark on ADC  Impression: 74 year old female with what is likely a multifactorial cognitive impairment including resting  hearing impairment, chronic opioid use, CKD, depression as well as extensive small vessel disease.  Though there is question of artifact, given her extensive small vessel disease, I think that using this opportunity to maximize her small vessel risk factor management would be prudent and she has already been admitted to undergo such.  She can continue undergoing work-up for her chronic cognitive decline as per her neurologist.  Recommendations: 1) carotid Dopplers, echocardiogram 2) aspirin 81 mg and Plavix 75 mg x3 weeks followed by monotherapy with Plavix 75 mg 3) high intensity statin therapy, I have ordered atorvastatin 80 mg daily 4) PT, OT, ST evaluations 5) check A1c and treat diabetes if discovered per internal medicine 6) neurology will be available as needed, please call if there are any further questions or concerns.   Roland Rack, MD [facility] [phone]  If 7pm- 7am, please page neurology on call as listed in [place].

## 2020-06-10 NOTE — H&P (Signed)
History and Physical    [name] AQT:[id number] DOB: [birth year] DOA: 06/10/2020  Referring MD/NP/PA:   PCP: Venia Carbon, MD   Patient coming from:  The patient is coming from home.  At baseline, pt is independent for most of ADL.        Chief Complaint: Abnormal MRI for brain  HPI: [name] is a 74 y.o. female with medical history significant of hypertension, diabetes mellitus, hepatitis, IBS, chronic back pain, cellulitis, dCHF, CKD-3, memory loss, who presents with abnormal MRI of brain.  Per sister, pt has been more forgetful and intermittently confused, which has been going on for about 3 months. She states that she will forget words.  She will forget how to spell words.  No unilateral numbness or tingling self empties.  No facial droop or slurred speech.  No recent fall or syncope.  Denies dizziness. Patient was sent by Dr. Brigitte Pulse of neurology in the office last week, and had MRI of brain on 06/08/20. The results of MRI showed possible stroke. Patient was called by her neurologist and advised to come to hospital for further evaluation and treatment.  Patient denies chest pain, cough, shortness of breath.  No nausea vomiting, diarrhea or abdominal pain.  Denies symptoms of UTI.  ED Course: pt was found to have WBC 9.3, negative Covid PCR, stable renal function, temperature normal, blood pressure 158/102, heart rate 72, RR 18, oxygen saturation 95% on room air.  Patient is placed on MedSurg bed for observation.  Dr. Leonel Ramsay for neurology is consulted.  Out pt MRI of brain on 06/08/20: 1. Tiny focal left basal ganglia restricted diffusion may reflect an acute lacunar infarct versus artifact. 2. Chronic microvascular ischemic changes and remote basal ganglia lacunar insults. 3. Right frontoethmoid and maxillary sinus disease. 4. NeuroQuant volumetric analysis of the brain, see details on [employer].  Review of Systems:   General: no fevers, chills, no body weight gain,  has fatigue HEENT: no blurry vision, hearing changes or sore throat Respiratory: no dyspnea, coughing, wheezing CV: no chest pain, no palpitations GI: no nausea, vomiting, abdominal pain, diarrhea, constipation GU: no dysuria, burning on urination, increased urinary frequency, hematuria  Ext: no leg edema Neuro: no unilateral weakness, numbness, or tingling, no vision change or hearing loss.  Has intermittent confusion and memory loss Skin: no rash, no skin tear. MSK: No muscle spasm, no deformity, no limitation of range of movement in spin Heme: No easy bruising.  Travel history: No recent long distant travel.  Allergy: No Known Allergies  Past Medical History:  Diagnosis Date  . Arthritis   . Confusion   . Diabetes mellitus without complication ([place])    type 2  . Hepatitis    pt. denies  . Hypertension   . Lumbar stenosis   . Memory loss   . Nocturia   . Numbness and tingling    legs and feet  . Wears glasses     Past Surgical History:  Procedure Laterality Date  . [place]  . COLONOSCOPY    . HEMORRHOID SURGERY  2015  . JOINT REPLACEMENT     01-08-18 Dr. Kathrynn Speed  . TOTAL HIP ARTHROPLASTY Left 01/08/2018   Procedure: LEFT TOTAL HIP ARTHROPLASTY ANTERIOR APPROACH;  Surgeon: Mcarthur Rossetti, MD;  Location: [facility];  Service: Orthopedics;  Laterality: Left;  . TRANSFORAMINAL LUMBAR INTERBODY FUSION (TLIF) WITH PEDICLE SCREW FIXATION 2 LEVEL N/A 11/07/2015   Procedure: Lumbar three-four Lumbar four-five  transforaminal lumbar interbody fusion with interbody prosthesis posterior lateral arthrodesis and posterior segmental instrumentation;  Surgeon: Newman Pies, MD;  Location: [place] [facility];  Service: Neurosurgery;  Laterality: N/A;    Social History:  reports that she has never smoked. She has never used smokeless tobacco. She reports that she does not drink alcohol and does not use drugs.  Family History:  Family History  Problem Relation Age of  Onset  . Hypertension Other   . Cancer Other        ovarian  . Cancer Mother   . Heart disease Father      Prior to Admission medications   Medication Sig Start Date End Date Taking? Authorizing Provider  Accu-Chek Softclix Lancets lancets Use as instructed 02/28/20  Yes Venia Carbon, MD  acetaminophen (TYLENOL) 500 MG tablet Take 500 mg by mouth every 4 (four) hours as needed for mild pain or fever.    Yes [provider]  Cholecalciferol (VITAMIN D3) 25 MCG (1000 UT) CAPS Take 1,000 Units by mouth daily.    Yes [provider]  Chromium Picolinate 1000 MCG TABS Take 1,000 mcg by mouth daily.    Yes [provider]  Coenzyme Q10 (CO Q 10) 100 MG CAPS Take 100 mg by mouth daily.    Yes [provider]  Digestive Enzymes CAPS Take 2 capsules by mouth daily.    Yes [provider]  furosemide (LASIX) 20 MG tablet Take 20 mg by mouth daily as needed for edema. 04/09/20 04/09/21 Yes [provider]  gabapentin (NEURONTIN) 300 MG capsule Take 600-900 mg by mouth See admin instructions. Take 2 capsules (600mg ) by mouth twice daily and take 3 capsules (900mg ) by mouth at bedtime   Yes [provider]  glucose blood (ONETOUCH VERIO) test strip Use to check blood sugar once a day Dx Code E11.40 03/07/20  Yes Venia Carbon, MD  lisinopril-hydrochlorothiazide (ZESTORETIC) 20-25 MG tablet TAKE 2 TABLETS BY MOUTH EVERY DAY Patient taking differently: Take 2 tablets by mouth daily.  08/10/19  Yes Venia Carbon, MD  metFORMIN (GLUCOPHAGE-XR) 500 MG 24 hr tablet TAKE 1 TABLET BY MOUTH EVERY DAY WITH BREAKFAST Patient taking differently: Take 500 mg by mouth daily with breakfast.  11/03/19  Yes Venia Carbon, MD  Multiple Vitamins-Minerals (MULTIVITAMIN WITH MINERALS) tablet Take 1 tablet by mouth daily.    Yes [provider]  Omega-3 Fatty Acids (FISH OIL) 1000 MG CAPS Take 2,000 mg by mouth.   Yes [provider]   Soya Lecithin 1200 MG CAPS Take 1,200 mg by mouth 3 (three) times daily.    Yes [provider]  traMADol (ULTRAM) 50 MG tablet TAKE 2 TABLETS (100 MG TOTAL) BY MOUTH 3 (THREE) TIMES DAILY AS NEEDED. Patient taking differently: Take 100 mg by mouth 3 (three) times daily as needed for moderate pain or severe pain.  06/07/20  Yes Venia Carbon, MD  vitamin C (ASCORBIC ACID) 500 MG tablet Take 500 mg by mouth daily.   Yes [provider]    Physical Exam: Vitals:   06/10/20 0926 06/10/20 1400 06/10/20 1538 06/10/20 1539  BP: 127/60 (!) 158/102 (!) 183/101   Pulse: 85 72 66   Resp: 18 16  16   Temp: 98.4 F (36.9 C)     TempSrc: Oral     SpO2: 95% 95% 100%   Weight: 79.4 kg     Height: 5\' 1"  (1.549 m)  General: Not in acute distress HEENT:       Eyes: PERRL, EOMI, no scleral icterus.       ENT: No discharge from the ears and nose, no pharynx injection, no tonsillar enlargement.        Neck: No JVD, no bruit, no mass felt. Heme: No neck lymph node enlargement. Cardiac: S1/S2, RRR, No murmurs, No gallops or rubs. Respiratory:  No rales, wheezing, rhonchi or rubs. GI: Soft, nondistended, nontender, no rebound pain, no organomegaly, BS present. GU: No hematuria Ext: No pitting leg edema bilaterally. 2+DP/PT pulse bilaterally. Musculoskeletal: No joint deformities, No joint redness or warmth, no limitation of ROM in spin. Skin: No rashes.  Neuro: Alert, oriented X3, cranial nerves II-XII grossly intact, moves all extremities normally Psych: Patient is not psychotic, no suicidal or hemocidal ideation.  Labs on Admission: I have personally reviewed following labs and imaging studies  CBC: Recent Labs  Lab 06/10/20 0931  WBC 9.3  NEUTROABS 7.2  HGB 14.0  HCT 41.1  MCV 98.6  PLT 035   Basic Metabolic Panel: Recent Labs  Lab 06/10/20 0931  NA 139  K 3.9  CL 99  CO2 30  GLUCOSE 124*  BUN 29*  CREATININE 1.18*  CALCIUM 8.9   GFR: Estimated  Creatinine Clearance: 41.1 mL/min (A) (by C-G formula based on SCr of 1.18 mg/dL (H)). Liver Function Tests: Recent Labs  Lab 06/10/20 0931  AST 17  ALT 13  ALKPHOS 55  BILITOT 0.7  PROT 6.8  ALBUMIN 3.6   No results for input(s): LIPASE, AMYLASE in the last 168 hours. No results for input(s): AMMONIA in the last 168 hours. Coagulation Profile: No results for input(s): INR, PROTIME in the last 168 hours. Cardiac Enzymes: No results for input(s): CKTOTAL, CKMB, CKMBINDEX, TROPONINI in the last 168 hours. BNP (last 3 results) No results for input(s): PROBNP in the last 8760 hours. HbA1C: No results for input(s): HGBA1C in the last 72 hours. CBG: No results for input(s): GLUCAP in the last 168 hours. Lipid Profile: No results for input(s): CHOL, HDL, LDLCALC, TRIG, CHOLHDL, LDLDIRECT in the last 72 hours. Thyroid Function Tests: No results for input(s): TSH, T4TOTAL, FREET4, T3FREE, THYROIDAB in the last 72 hours. Anemia Panel: No results for input(s): VITAMINB12, FOLATE, FERRITIN, TIBC, IRON, RETICCTPCT in the last 72 hours. Urine analysis: No results found for: COLORURINE, APPEARANCEUR, LABSPEC, PHURINE, GLUCOSEU, HGBUR, BILIRUBINUR, KETONESUR, PROTEINUR, UROBILINOGEN, NITRITE, LEUKOCYTESUR Sepsis Labs: @LABRCNTIP (procalcitonin:4,lacticidven:4) ) Recent Results (from the past 240 hour(s))  Respiratory Panel by RT PCR (Flu A&B, Covid) - Nasopharyngeal Swab     Status: None   Collection Time: 06/10/20  2:07 PM   Specimen: Nasopharyngeal Swab  Result Value Ref Range Status   SARS Coronavirus 2 by RT PCR NEGATIVE NEGATIVE Final    Comment: (NOTE) SARS-CoV-2 target nucleic acids are NOT DETECTED.  The SARS-CoV-2 RNA is generally detectable in upper respiratoy specimens during the acute phase of infection. The lowest concentration of SARS-CoV-2 viral copies this assay can detect is 131 copies/mL. A negative result does not preclude SARS-Cov-2 infection and should not be used  as the sole basis for treatment or other patient management decisions. A negative result may occur with  improper specimen collection/handling, submission of specimen other than nasopharyngeal swab, presence of viral mutation(s) within the areas targeted by this assay, and inadequate number of viral copies (<131 copies/mL). A negative result must be combined with clinical observations, patient history, and epidemiological information. The expected result is Negative.  Fact Sheet for Patients:  PinkCheek.be  Fact Sheet for Healthcare Providers:  GravelBags.it  This test is no t yet approved or cleared by the [place] FDA and  has been authorized for detection and/or diagnosis of SARS-CoV-2 by FDA under an Emergency Use Authorization (EUA). This EUA will remain  in effect (meaning this test can be used) for the duration of the COVID-19 declaration under Section 564(b)(1) of the Act, 21 U.S.C. section 360bbb-3(b)(1), unless the authorization is terminated or revoked sooner.     Influenza A by PCR NEGATIVE NEGATIVE Final   Influenza B by PCR NEGATIVE NEGATIVE Final    Comment: (NOTE) The Xpert Xpress SARS-CoV-2/FLU/RSV assay is intended as an aid in  the diagnosis of influenza from Nasopharyngeal swab specimens and  should not be used as a sole basis for treatment. Nasal washings and  aspirates are unacceptable for Xpert Xpress SARS-CoV-2/FLU/RSV  testing.  Fact Sheet for Patients: PinkCheek.be  Fact Sheet for Healthcare Providers: GravelBags.it  This test is not yet approved or cleared by the [place] FDA and  has been authorized for detection and/or diagnosis of SARS-CoV-2 by  FDA under an Emergency Use Authorization (EUA). This EUA will remain  in effect (meaning this test can be used) for the duration of the  Covid-19 declaration under Section  564(b)(1) of the Act, 21  U.S.C. section 360bbb-3(b)(1), unless the authorization is  terminated or revoked. Performed at [facility], [address], [place],  [address]      Radiological Exams on Admission: No results found.   EKG: Independently reviewed.  Sinus rhythm, QTC 478, bifascicular block, nonspecific T wave change  Assessment/Plan Principal Problem:   Stroke [facility]) Active Problems:   Essential hypertension, benign   Memory loss   Chronic diastolic CHF (congestive heart failure) (HCC)   CKD (chronic kidney disease), stage IIIa   Type II diabetes mellitus with renal manifestations ([place])   Questionable stroke: MRI of brain showed tiny focal left basal ganglia restricted diffusion, which may reflect an acute lacunar infarct versus artifact.  Dr. Leonel Ramsay of neurology is consulted, who recommended to get a carotid Doppler and 2D echo.  - Placed on MedSurg bed for observation - fasting lipid panel and HbA1c  - swallowing screen. If fails, will get SLP - will follow up Neurology's Recs as below Recommendations: 1) carotid Dopplers, echocardiogram 2) aspirin 81 mg and Plavix 75 mg x3 weeks followed by monotherapy with Plavix 75 mg 3) high intensity statin therapy, I have ordered atorvastatin 80 mg daily 4) PT, OT, ST evaluations 5) check A1c and treat diabetes if discovered per internal medicine  Memory loss: -Follow-up with her neurologist for outpatient work-up  Essential hypertension, benign -IV hydralazine as needed -Continue home Zestoretic  Chronic diastolic CHF (congestive heart failure) ([place]): 2D echo on 04/16/2016 showed EF of 55 to 60% with grade 1 diastolic dysfunction.  Patient is on low-dose 20 mg Lasix as needed at home.  Patient does not have leg edema.  No respiratory distress.  CHF is compensated. -Hold Lasix -Check BMP  CKD (chronic kidney disease), stage IIIa: Stable.  Baseline creatinine 1.27 on 02/25/2020 recently.  Her  creatinine is 1.18, BUN 29. -Follow-up with BMP  Type II diabetes mellitus with renal manifestations ([place]): Recent A1c 6.1 on 12/23/2019.  Patient is taking Metformin.  Well controlled. -Sliding scale insulin         DVT ppx: SQ Lovenox Code Status: Full code Family Communication:    Yes,  patient's sister   at bed side Disposition Plan:  Anticipate discharge back to previous environment Consults called: Dr. Leonel Ramsay of neurology Admission status: Med-surg bed for obs  Status is: Observation  The patient remains OBS appropriate and will d/c before 2 midnights.  Dispo: The patient is from: Home              Anticipated d/c is to: Home              Anticipated d/c date is: 1 day              Patient currently is not medically stable to d/c.          Date of Service 06/10/2020    Ivor Costa [facility]   If 7PM-7AM, please contact night-coverage [email or web address] 06/10/2020, 5:25 PM

## 2020-06-10 NOTE — ED Triage Notes (Signed)
Pt was sent here after possible stroke per MRI.  Pt has had intermittent forgetfulness and confusion for 2 months which is what led to MRI.  Pt oriented at this time.  VSS. NAD.

## 2020-06-10 NOTE — ED Provider Notes (Signed)
[facility] Emergency Department Provider Note  ____________________________________________  Time seen: Approximately 2:07 PM  I have reviewed the triage vital signs and the nursing notes.   HISTORY  Chief Complaint abnormal MRI    HPI [name] is a 74 y.o. female that presents to the emergency department for a stroke work-up after an abnormal MRI last week.  Patient called by her neurologist, Dr. Manuella Ghazi and was notified her that her MRI showed a possible stroke.  Patient had the MRI because she has become more forgetful and confused over the last 3 months.  She states that she will forget words.  She will forget how to spell words and to spell them for her.  She will forget people's names.  She has not had any falls or syncopal episodes.  No dizziness, weakness.  Past Medical History:  Diagnosis Date  . Arthritis   . Confusion   . Diabetes mellitus without complication ([place])    type 2  . Hepatitis    pt. denies  . Hypertension   . Lumbar stenosis   . Memory loss   . Nocturia   . Numbness and tingling    legs and feet  . Wears glasses     Patient Active Problem List   Diagnosis Date Noted  . Stroke ([place]) 06/10/2020  . Cellulitis of left foot 05/10/2020  . Facial laceration 02/28/2020  . Contusion, back 02/28/2020  . Dizziness 02/28/2020  . Chronic renal insufficiency, stage 1 11/11/2019  . Shoulder tendonitis, left 07/25/2019  . Osteoarthritis of right knee 11/16/2018  . Urge incontinence 10/07/2018  . Mycotic toenails 10/07/2018  . Venous insufficiency 10/07/2018  . Unilateral primary osteoarthritis, left hip 01/08/2018  . Status post total replacement of left hip 01/08/2018  . Left hip pain 12/09/2017  . Pain syndrome, chronic 03/13/2017  . Facet arthropathy, lumbar 03/13/2017  . Advance directive discussed with patient 01/15/2016  . Spinal stenosis of lumbar region with neurogenic claudication 11/07/2015  . Other intervertebral  disc degeneration, lumbar region 06/01/2015  . Chronic back pain 03/29/2015  . Preventative health care 01/10/2015  . Osteoarthritis, hip, bilateral 11/28/2014  . Peripheral neuropathy 11/28/2014  . Type 2 diabetes, controlled, with neuropathy ([place]) 01/20/2014  . Episodic mood disorder ([place]) 01/20/2014  . IBS (irritable bowel syndrome) 12/27/2012  . Essential hypertension, benign 08/06/2010    Past Surgical History:  Procedure Laterality Date  . [place]  . COLONOSCOPY    . HEMORRHOID SURGERY  2015  . JOINT REPLACEMENT     01-08-18 Dr. Kathrynn Speed  . TOTAL HIP ARTHROPLASTY Left 01/08/2018   Procedure: LEFT TOTAL HIP ARTHROPLASTY ANTERIOR APPROACH;  Surgeon: Mcarthur Rossetti, MD;  Location: [facility] ORS;  Service: Orthopedics;  Laterality: Left;  . TRANSFORAMINAL LUMBAR INTERBODY FUSION (TLIF) WITH PEDICLE SCREW FIXATION 2 LEVEL N/A 11/07/2015   Procedure: Lumbar three-four Lumbar four-five  transforaminal lumbar interbody fusion with interbody prosthesis posterior lateral arthrodesis and posterior segmental instrumentation;  Surgeon: Newman Pies, MD;  Location: [place] [facility] OR[facility];  Service: Neurosurgery;  Laterality: N/A;    Prior to Admission medications   Medication Sig Start Date End Date Taking? Authorizing Provider  Accu-Chek Softclix Lancets lancets Use as instructed 02/28/20  Yes Venia Carbon, MD  acetaminophen (TYLENOL) 500 MG tablet Take 500 mg by mouth every 4 (four) hours as needed for mild pain or fever.    Yes [provider]  Cholecalciferol (VITAMIN D3) 25 MCG (1000 UT) CAPS Take  1,000 Units by mouth daily.    Yes [provider]  Chromium Picolinate 1000 MCG TABS Take 1,000 mcg by mouth daily.    Yes [provider]  Coenzyme Q10 (CO Q 10) 100 MG CAPS Take 100 mg by mouth daily.    Yes [provider]  Digestive Enzymes CAPS Take 2 capsules by mouth daily.    Yes [provider]  furosemide (LASIX) 20 MG tablet  Take 20 mg by mouth daily as needed for edema. 04/09/20 04/09/21 Yes [provider]  gabapentin (NEURONTIN) 300 MG capsule Take 600-900 mg by mouth See admin instructions. Take 2 capsules (600mg ) by mouth twice daily and take 3 capsules (900mg ) by mouth at bedtime   Yes [provider]  glucose blood (ONETOUCH VERIO) test strip Use to check blood sugar once a day Dx Code E11.40 03/07/20  Yes Venia Carbon, MD  lisinopril-hydrochlorothiazide (ZESTORETIC) 20-25 MG tablet TAKE 2 TABLETS BY MOUTH EVERY DAY Patient taking differently: Take 2 tablets by mouth daily.  08/10/19  Yes Venia Carbon, MD  metFORMIN (GLUCOPHAGE-XR) 500 MG 24 hr tablet TAKE 1 TABLET BY MOUTH EVERY DAY WITH BREAKFAST Patient taking differently: Take 500 mg by mouth daily with breakfast.  11/03/19  Yes Venia Carbon, MD  Multiple Vitamins-Minerals (MULTIVITAMIN WITH MINERALS) tablet Take 1 tablet by mouth daily.    Yes [provider]  Omega-3 Fatty Acids (FISH OIL) 1000 MG CAPS Take 2,000 mg by mouth.   Yes [provider]  Soya Lecithin 1200 MG CAPS Take 1,200 mg by mouth 3 (three) times daily.    Yes [provider]  traMADol (ULTRAM) 50 MG tablet TAKE 2 TABLETS (100 MG TOTAL) BY MOUTH 3 (THREE) TIMES DAILY AS NEEDED. Patient taking differently: Take 100 mg by mouth 3 (three) times daily as needed for moderate pain or severe pain.  06/07/20  Yes Venia Carbon, MD  vitamin C (ASCORBIC ACID) 500 MG tablet Take 500 mg by mouth daily.   Yes [provider]    Allergies Patient has no known allergies.  Family History  Problem Relation Age of Onset  . Hypertension Other   . Cancer Other        ovarian  . Cancer Mother   . Heart disease Father     Social History Social History   Tobacco Use  . Smoking status: Never Smoker  . Smokeless tobacco: Never Used  Vaping Use  . Vaping Use: Never used  Substance Use Topics  . Alcohol use: No    Comment: rarely  .  Drug use: No     Review of Systems  Cardiovascular: No chest pain. Respiratory: No SOB. Gastrointestinal: No abdominal pain.  No nausea, no vomiting.  Musculoskeletal: Negative for musculoskeletal pain. Skin: Negative for rash, abrasions, lacerations, ecchymosis. Neurological: Negative for headaches, numbness or tingling   ____________________________________________   PHYSICAL EXAM:  VITAL SIGNS: ED Triage Vitals [06/10/20 0926]  Enc Vitals Group     BP 127/60     Pulse Rate 85     Resp 18     Temp 98.4 F (36.9 C)     Temp Source Oral     SpO2 95 %     Weight 175 lb (79.4 kg)     Height 5\' 1"  (1.549 m)     Head Circumference      Peak Flow      Pain Score 0     Pain Loc  Pain Edu?      Excl. in [place]?      Constitutional: Alert and oriented. Well appearing and in no acute distress. Eyes: Conjunctivae are normal. PERRL. EOMI. Head: Atraumatic. ENT:      Ears:      Nose: No congestion/rhinnorhea.      Mouth/Throat: Mucous membranes are moist.  Neck: No stridor.  Cardiovascular: Normal rate, regular rhythm.  Good peripheral circulation. Respiratory: Normal respiratory effort without tachypnea or retractions. Lungs CTAB. Good air entry to the bases with no decreased or absent breath sounds. Gastrointestinal: Bowel sounds 4 quadrants. Soft and nontender to palpation. No guarding or rigidity. No palpable masses. No distention.  Musculoskeletal: Full range of motion to all extremities. No gross deformities appreciated. Neurologic: Normal speech and language. No gross focal neurologic deficits are appreciated.  Cranial nerves: 2-10 normal as tested. Strength 5/5 in upper and lower extremities Cerebellar: Finger-nose-finger WNL, Heel to shin WNL Sensorimotor: No pronator drift, clonus, sensory loss or abnormal reflexes. No vision deficits noted to confrontation bilaterally.  Speech: No dysarthria or expressive aphasia Skin:  Skin is warm, dry and intact. No rash  noted. Psychiatric: Mood and affect are normal. Speech and behavior are normal. Patient exhibits appropriate insight and judgement.   ____________________________________________   LABS (all labs ordered are listed, but only abnormal results are displayed)  Labs Reviewed  COMPREHENSIVE METABOLIC PANEL - Abnormal; Notable for the following components:      Result Value   Glucose, Bld 124 (*)    BUN 29 (*)    Creatinine, Ser 1.18 (*)    GFR calc non Af Amer 46 (*)    GFR calc Af Amer 53 (*)    All other components within normal limits  RESPIRATORY PANEL BY RT PCR (FLU A&B, COVID)  CBC WITH DIFFERENTIAL/PLATELET  URINALYSIS, COMPLETE (UACMP) WITH MICROSCOPIC   ____________________________________________  EKG   ____________________________________________  RADIOLOGY   No results found.  ____________________________________________    PROCEDURES  Procedure(s) performed:    Procedures    Medications - No data to display   ____________________________________________   INITIAL IMPRESSION / ASSESSMENT AND PLAN / ED COURSE  Pertinent labs & imaging results that were available during my care of the patient were reviewed by me and considered in my medical decision making (see chart for details).  Review of the [place] CSRS was performed in accordance of the [place] prior to dispensing any controlled drugs.  Patient presented to emergency department for admission for a stroke work-up following an abnormal MRI last week.  Vital signs and exam are reassuring.  Patient was admitted to the hospitalist service with Dr. Blaine Hamper.    [name] was evaluated in Emergency Department on 06/10/2020 for the symptoms described in the history of present illness. She was evaluated in the context of the global COVID-19 pandemic, which necessitated consideration that the patient might be at risk for infection with the SARS-CoV-2 virus that causes COVID-19. Institutional protocols and  algorithms that pertain to the evaluation of patients at risk for COVID-19 are in a state of rapid change based on information released by regulatory bodies including the CDC and federal and state organizations. These policies and algorithms were followed during the patient's care in the ED.   ____________________________________________  FINAL CLINICAL IMPRESSION(S) / ED DIAGNOSES  Final diagnoses:  Abnormal MRI  Confusion      NEW MEDICATIONS STARTED DURING THIS VISIT:  ED Discharge Orders    None  This chart was dictated using voice recognition software/Dragon. Despite best efforts to proofread, errors can occur which can change the meaning. Any change was purely unintentional.    Laban Emperor, PA-C 06/10/20 [place], [place], MD 06/11/20 [phone]

## 2020-06-11 ENCOUNTER — Observation Stay: Payer: Medicare Other

## 2020-06-11 ENCOUNTER — Observation Stay
Admit: 2020-06-11 | Discharge: 2020-06-11 | Disposition: A | Discharge status: Home / Self Care | Payer: Medicare Other | Attending: Internal Medicine | Admitting: Internal Medicine

## 2020-06-11 DIAGNOSIS — I639 Cerebral infarction, unspecified: Secondary | ICD-10-CM | POA: Diagnosis not present

## 2020-06-11 DIAGNOSIS — R413 Other amnesia: Secondary | ICD-10-CM | POA: Diagnosis not present

## 2020-06-11 DIAGNOSIS — I10 Essential (primary) hypertension: Secondary | ICD-10-CM | POA: Diagnosis not present

## 2020-06-11 DIAGNOSIS — I6389 Other cerebral infarction: Secondary | ICD-10-CM | POA: Diagnosis not present

## 2020-06-11 DIAGNOSIS — I6523 Occlusion and stenosis of bilateral carotid arteries: Secondary | ICD-10-CM | POA: Diagnosis not present

## 2020-06-11 DIAGNOSIS — Z8673 Personal history of transient ischemic attack (TIA), and cerebral infarction without residual deficits: Secondary | ICD-10-CM | POA: Diagnosis not present

## 2020-06-11 LAB — ECHOCARDIOGRAM COMPLETE
Height: 61 in
S' Lateral: 2.71 cm
Weight: 2800 oz

## 2020-06-11 LAB — GLUCOSE, CAPILLARY
Glucose-Capillary: 113 mg/dL — ABNORMAL HIGH (ref 70–99)
Glucose-Capillary: 174 mg/dL — ABNORMAL HIGH (ref 70–99)

## 2020-06-11 LAB — LIPID PANEL
Cholesterol: 176 mg/dL (ref 0–200)
HDL: 63 mg/dL (ref >40)
LDL Cholesterol: 101 mg/dL — ABNORMAL HIGH (ref 0–99)
Total CHOL/HDL Ratio: 2.8 RATIO
Triglycerides: 58 mg/dL (ref <150)
VLDL: 12 mg/dL (ref 0–40)

## 2020-06-11 LAB — BRAIN NATRIURETIC PEPTIDE: B Natriuretic Peptide: 168.6 pg/mL — ABNORMAL HIGH (ref 0.0–100.0)

## 2020-06-11 LAB — HEMOGLOBIN A1C
Hgb A1c MFr Bld: 6.1 % — ABNORMAL HIGH (ref 4.8–5.6)
Mean Plasma Glucose: 128.37 mg/dL

## 2020-06-11 LAB — APTT: aPTT: 28 seconds (ref 24–36)

## 2020-06-11 LAB — PROTIME-INR
INR: 0.9 (ref 0.8–1.2)
Prothrombin Time: 11.7 seconds (ref 11.4–15.2)

## 2020-06-11 MED ORDER — STROKE: EARLY STAGES OF RECOVERY BOOK: 1.0000 | Freq: Once | Status: AC

## 2020-06-11 MED ORDER — CLOPIDOGREL BISULFATE 75 MG PO TABS: 75.0000 mg | ORAL_TABLET | Freq: Every day | ORAL | 2 refills | Status: AC

## 2020-06-11 MED ORDER — ATORVASTATIN CALCIUM 80 MG PO TABS: 80.0000 mg | ORAL_TABLET | Freq: Every day | ORAL | 2 refills | Status: DC

## 2020-06-11 MED ORDER — ASPIRIN 81 MG PO TBEC: 81.0000 mg | DELAYED_RELEASE_TABLET | Freq: Every day | ORAL | Status: AC

## 2020-06-11 NOTE — Evaluation (Signed)
Physical Therapy Evaluation Patient Details Name: [name] MRN: [id number] DOB: [birth year] Today's Date: 06/11/2020   History of Present Illness  Pt is a 74 yo female that presented to ED for stroke workup due to possible stroke versus artifact on MRI. Currently undergoing work up with neurology for chronic cognitive decline. PMH of DM, HTN, lumbar stenosis, memory loss, CKD, HOH, depression.    Clinical Impression  Pt alert, speech at bedside, oriented x4. Pt denied any changes in LE strength, does cite that she has had difficulty this hospital stay with opening food items. At baseline pt is independent, drives, grocery shops, and has had 2-3 falls. Pt with some difficulty with word finding but able to express her needs/concerns.   The patient demonstrated LE generalized weakness but able to lift against gravity without assistance. Pt performed bed mobility and transfers modI, and ambulated in room ~64ft with RW and supervision. Decreased step height/foot clearance noted, no physical assist needed but pt did exhibit some difficulty with turning RW, educated about technique as well as potential need for new tennis balls on her walker. Pt eager to return to breakfast so further mobility deferred. Overall the patient demonstrated mild deficits in balance and activity tolerance, would benefit from further skilled PT to decrease risk of falls and maximize safety.    Follow Up Recommendations Outpatient PT    Equipment Recommendations  None recommended by PT    Recommendations for Other Services       Precautions / Restrictions Precautions Precautions: Fall Restrictions Weight Bearing Restrictions: No      Mobility  Bed Mobility Overal bed mobility: Modified Independent                Transfers Overall transfer level: Modified independent Equipment used: Rolling walker (2 wheeled)                Ambulation/Gait Ambulation/Gait assistance: Supervision Gait  Distance (Feet): 18 Feet Assistive device: Rolling walker (2 wheeled)       General Gait Details: decreased step height/foot clearance, no physical assist needed but pt did exhibit some difficulty with turning RW, educated about technique as well as potential need for new tennis balls on her walker.  Stairs            Wheelchair Mobility    Modified Rankin (Stroke Patients Only)       Balance Overall balance assessment: Needs assistance Sitting-balance support: Feet supported Sitting balance-Leahy Scale: Good       Standing balance-Leahy Scale: Fair Standing balance comment: able to ambulate a few steps without RW, but significant improvement in sasfety noted with RW                             Pertinent Vitals/Pain Pain Assessment: No/denies pain    Home Living Family/patient expects to be discharged to:: Private residence Living Arrangements: Alone Available Help at Discharge: Family;Available PRN/intermittently Type of Home: Mobile home Home Access: Stairs to enter Entrance Stairs-Rails: Right;Left Entrance Stairs-Number of Steps: 6 Home Layout: One level Home Equipment: Walker - 2 wheels;Walker - 4 wheels;Shower seat;Hand held shower head Additional Comments: Patient reported 3 falls in the last 6 months    Prior Function Level of Independence: Independent         Comments: uses RW from stair to chair. drives grocery shops     Hand Dominance   Dominant Hand: Right    Extremity/Trunk Assessment   Upper  Extremity Assessment Upper Extremity Assessment: Overall WFL for tasks assessed;Defer to OT evaluation    Lower Extremity Assessment Lower Extremity Assessment: Generalized weakness (able to lift extremities against gravity independently)    Cervical / Trunk Assessment Cervical / Trunk Assessment: Kyphotic  Communication   Communication: No difficulties  Cognition Arousal/Alertness: Awake/alert Behavior During Therapy: WFL for  tasks assessed/performed Overall Cognitive Status: Within Functional Limits for tasks assessed                                 General Comments: occasional difficulty with word finding noted, but able to express her needs/concerns      General Comments      Exercises     Assessment/Plan    PT Assessment Patient needs continued PT services  PT Problem List Decreased strength;Decreased mobility;Decreased balance;Decreased activity tolerance       PT Treatment Interventions DME instruction;Therapeutic exercise;Gait training;Balance training;Stair training;Neuromuscular re-education;Functional mobility training;Therapeutic activities;Patient/family education    PT Goals (Current goals can be found in the Care Plan section)  Acute Rehab PT Goals Patient Stated Goal: to go home PT Goal Formulation: With patient Time For Goal Achievement: 06/25/20 Potential to Achieve Goals: Good    Frequency Min 2X/week   Barriers to discharge        Co-evaluation               AM-PAC PT "6 Clicks" Mobility  Outcome Measure Help needed turning from your back to your side while in a flat bed without using bedrails?: None Help needed moving from lying on your back to sitting on the side of a flat bed without using bedrails?: None Help needed moving to and from a bed to a chair (including a wheelchair)?: None Help needed standing up from a chair using your arms (e.g., wheelchair or bedside chair)?: None Help needed to walk in hospital room?: None Help needed climbing 3-5 steps with a railing? : A Little 6 Click Score: 23    End of Session   Activity Tolerance: Patient tolerated treatment well Patient left: in bed;with call bell/phone within reach Nurse Communication: Mobility status PT Visit Diagnosis: Other abnormalities of gait and mobility (R26.89);History of falling (Z91.81)    Time: 3295-1884 PT Time Calculation (min) (ACUTE ONLY): 17 min   Charges:   PT  Evaluation $PT Eval Low Complexity: 1 Low          Lieutenant Diego PT, DPT 10:37 AM,06/11/20

## 2020-06-11 NOTE — ED Notes (Signed)
E-signature not working at this time. Pt verbalized understanding of D/C instructions, prescriptions and follow up care with no further questions at this time. Pt in NAD and wheeled to lobby at time of D/C.  

## 2020-06-11 NOTE — Progress Notes (Signed)
OT Cancellation Note  Patient Details Name: [name] MRN: [id number] DOB: 09-20-1945   Cancelled Treatment:    Reason Eval/Treat Not Completed: Patient at procedure or test/ unavailable  Echo in room with pt at this time. Will f/u for OT evaluation at later date/time as able/available. Thank you.  Gerrianne Scale, [place], OTR/L ascom [phone] 06/11/20, 10:36 AM

## 2020-06-11 NOTE — Discharge Summary (Signed)
Physician Discharge Summary   [name]  female DOB: [birth year]  TLX:[id number]  PCP: Venia Carbon, MD  Admit date: 06/10/2020 Discharge date: 06/11/2020  Admitted From: home Disposition:  home CODE STATUS: Full code  Discharge Instructions    Discharge instructions   Complete by: As directed    Neurology had seen you in the hospital, and recommended aspirin 81 mg and Plavix 75 mg together for 3 weeks and then just Plavix 75 mg daily alone after that.  Also started you on cholesterol medicine, Lipitor 80 mg daily  Please continue to follow up with your outpatient neurologist.   Dr. Enzo Bi [facility] Course:  For full details, please see H&P, progress notes, consult notes and ancillary notes.  Briefly,  [name] is a 74 y.o. female with medical history significant of hypertension, diabetes mellitus, hepatitis, IBS, chronic back pain, cellulitis, dCHF, CKD-3, memory loss, who presents with abnormal MRI of brain.  Chronic multifactorial cognitive impairment  Contributed by resting hearing impairment, chronic opioid use, CKD, depression as well as extensive small vessel disease.  Pt can continue workup per her outpatient neuolrogist.   Questionable stroke:  MRI of brain showed tiny focal left basal ganglia restricted diffusion, which may reflect an acute lacunar infarct versus artifact.  Dr. Leonel Ramsay of neurology consulted.  Carotid Dopplers with no significant stenosis.  Echocardiogram performed, no thrombus, but interatrial septum was not well visualized.  Neuro recommended aspirin 81 mg and Plavix 75 mg x3 weeks followed by monotherapy with Plavix 75 mg.  High intensity statin therapy, ordered atorvastatin 80 mg daily.  Pt can continue outpatient followup with her outpatient neurologist.   Discharge Diagnoses:  Principal Problem:   Stroke [facility]) Active Problems:   Essential hypertension, benign   Memory loss   Chronic diastolic CHF  (congestive heart failure) ([place])   CKD (chronic kidney disease), stage IIIa   Type II diabetes mellitus with renal manifestations [facility])    Discharge Instructions:  Allergies as of 06/11/2020   No Known Allergies     Medication List    TAKE these medications    stroke: mapping our early stages of recovery book Misc 1 each by Does not apply route once for 1 dose.   Accu-Chek Softclix Lancets lancets Use as instructed   acetaminophen 500 MG tablet Commonly known as: TYLENOL Take 500 mg by mouth every 4 (four) hours as needed for mild pain or fever.   aspirin 81 MG EC tablet Take 1 tablet (81 mg total) by mouth daily for 21 days.   atorvastatin 80 MG tablet Commonly known as: LIPITOR Take 1 tablet (80 mg total) by mouth daily.   Chromium Picolinate 1000 MCG Tabs Take 1,000 mcg by mouth daily.   clopidogrel 75 MG tablet Commonly known as: PLAVIX Take 1 tablet (75 mg total) by mouth daily. To help prevent stroke.   Co Q 10 100 MG Caps Take 100 mg by mouth daily.   Digestive Enzymes Caps Take 2 capsules by mouth daily.   Fish Oil 1000 MG Caps Take 2,000 mg by mouth.   furosemide 20 MG tablet Commonly known as: LASIX Take 20 mg by mouth daily as needed for edema.   gabapentin 300 MG capsule Commonly known as: NEURONTIN Take 600-900 mg by mouth See admin instructions. Take 2 capsules (600mg ) by mouth twice daily and take 3 capsules (900mg ) by mouth at bedtime   lisinopril-hydrochlorothiazide 20-25 MG tablet  Commonly known as: ZESTORETIC TAKE 2 TABLETS BY MOUTH EVERY DAY   metFORMIN 500 MG 24 hr tablet Commonly known as: GLUCOPHAGE-XR TAKE 1 TABLET BY MOUTH EVERY DAY WITH BREAKFAST What changed: See the new instructions.   multivitamin with minerals tablet Take 1 tablet by mouth daily.   OneTouch Verio test strip Generic drug: glucose blood Use to check blood sugar once a day Dx Code E11.40   Soya Lecithin 1200 MG Caps Take 1,200 mg by mouth 3 (three)  times daily.   traMADol 50 MG tablet Commonly known as: ULTRAM TAKE 2 TABLETS (100 MG TOTAL) BY MOUTH 3 (THREE) TIMES DAILY AS NEEDED. What changed: reasons to take this   vitamin C 500 MG tablet Commonly known as: ASCORBIC ACID Take 500 mg by mouth daily.   Vitamin D3 25 MCG (1000 UT) Caps Take 1,000 Units by mouth daily.        Follow-up Information    Venia Carbon, MD. Schedule an appointment as soon as possible for a visit in 1 week(s).   Specialties: Internal Medicine, Pediatrics Contact information: [place] [address] [phone]        Vladimir Crofts, MD. Schedule an appointment as soon as possible for a visit in 2 week(s).   Specialty: Neurology Contact information: [facility] [place] [address] [phone]               No Known Allergies   The results of significant diagnostics from this hospitalization (including imaging, microbiology, ancillary and laboratory) are listed below for reference.   Consultations:   Procedures/Studies: MR BRAIN WO CONTRAST  Result Date: 06/08/2020 CLINICAL DATA:  Cognitive impairment. EXAM: MRI HEAD WITHOUT CONTRAST TECHNIQUE: Multiplanar, multiecho pulse sequences of the brain and surrounding structures were obtained without intravenous contrast. Additionally, using NeuroQuant software a 3D volumetric analysis of the brain was performed and is compared to a normative database adjusted for age, gender and intracranial volume. COMPARISON:  02/25/2020 head CT. FINDINGS: Brain: Tiny focal restricted diffusion involving the left basal ganglia. No intracranial hemorrhage. Scattered and confluent T2/FLAIR hyperintense foci involving the supratentorial white matter and pons are nonspecific however commonly associated with chronic microvascular ischemic changes. Sequela of remote bilateral basal ganglia lacunar insults. Cerebral volume loss with ex vacuo  dilatation. No midline shift, ventriculomegaly or extra-axial fluid collection. No mass lesion. Vascular: Preserved major intracranial flow voids. Skull and upper cervical spine: Normal marrow signal. Sinuses/Orbits: Normal orbits. Right frontal, ethmoid and maxillary sinus mucosal thickening. No mastoid effusion. Other: None. NeuroQuant Findings: Volumetric analysis of the brain was performed, with a fully detailed report in [facility]. Briefly, the comparison with age and gender matched reference reveals whole brain volumes are below normal limits. Lateral ventricular and thalamic volumes are within normal limits. IMPRESSION: 1. Tiny focal left basal ganglia restricted diffusion may reflect an acute lacunar infarct versus artifact. 2. Chronic microvascular ischemic changes and remote basal ganglia lacunar insults. 3. Right frontoethmoid and maxillary sinus disease. 4. NeuroQuant volumetric analysis of the brain, see details on [employer]. These results were called by telephone at the time of interpretation on 06/08/2020 at 5:15 pm to provider Dr Lyndel Pleasure, who verbally acknowledged these results. Electronically Signed   By: Primitivo Gauze M.D.   On: 06/08/2020 17:32      Labs: BNP (last 3 results) Recent Labs    06/11/20 0518  BNP 989.2*   Basic Metabolic Panel: Recent Labs  Lab 06/10/20 0931  NA 139  K 3.9  CL 99  CO2 30  GLUCOSE 124*  BUN 29*  CREATININE 1.18*  CALCIUM 8.9   Liver Function Tests: Recent Labs  Lab 06/10/20 0931  AST 17  ALT 13  ALKPHOS 55  BILITOT 0.7  PROT 6.8  ALBUMIN 3.6   No results for input(s): LIPASE, AMYLASE in the last 168 hours. No results for input(s): AMMONIA in the last 168 hours. CBC: Recent Labs  Lab 06/10/20 0931  WBC 9.3  NEUTROABS 7.2  HGB 14.0  HCT 41.1  MCV 98.6  PLT 274   Cardiac Enzymes: No results for input(s): CKTOTAL, CKMB, CKMBINDEX, TROPONINI in the last 168 hours. BNP: Invalid input(s): POCBNP CBG: Recent  Labs  Lab 06/11/20 0830  GLUCAP 113*   D-Dimer No results for input(s): DDIMER in the last 72 hours. Hgb A1c No results for input(s): HGBA1C in the last 72 hours. Lipid Profile Recent Labs    06/11/20 0518  CHOL 176  HDL 63  LDLCALC 101*  TRIG 58  CHOLHDL 2.8   Thyroid function studies No results for input(s): TSH, T4TOTAL, T3FREE, THYROIDAB in the last 72 hours.  Invalid input(s): FREET3 Anemia work up No results for input(s): VITAMINB12, FOLATE, FERRITIN, TIBC, IRON, RETICCTPCT in the last 72 hours. Urinalysis    Component Value Date/Time   COLORURINE STRAW (A) 06/10/2020 1407   APPEARANCEUR CLEAR (A) 06/10/2020 1407   LABSPEC 1.005 06/10/2020 1407   PHURINE 7.0 06/10/2020 1407   GLUCOSEU NEGATIVE 06/10/2020 1407   HGBUR NEGATIVE 06/10/2020 1407   BILIRUBINUR NEGATIVE 06/10/2020 1407   KETONESUR NEGATIVE 06/10/2020 1407   PROTEINUR NEGATIVE 06/10/2020 1407   NITRITE NEGATIVE 06/10/2020 1407   LEUKOCYTESUR NEGATIVE 06/10/2020 1407   Sepsis Labs Invalid input(s): PROCALCITONIN,  WBC,  LACTICIDVEN Microbiology Recent Results (from the past 240 hour(s))  Respiratory Panel by RT PCR (Flu A&B, Covid) - Nasopharyngeal Swab     Status: None   Collection Time: 06/10/20  2:07 PM   Specimen: Nasopharyngeal Swab  Result Value Ref Range Status   SARS Coronavirus 2 by RT PCR NEGATIVE NEGATIVE Final    Comment: (NOTE) SARS-CoV-2 target nucleic acids are NOT DETECTED.  The SARS-CoV-2 RNA is generally detectable in upper respiratoy specimens during the acute phase of infection. The lowest concentration of SARS-CoV-2 viral copies this assay can detect is 131 copies/mL. A negative result does not preclude SARS-Cov-2 infection and should not be used as the sole basis for treatment or other patient management decisions. A negative result may occur with  improper specimen collection/handling, submission of specimen other than nasopharyngeal swab, presence of viral mutation(s)  within the areas targeted by this assay, and inadequate number of viral copies (<131 copies/mL). A negative result must be combined with clinical observations, patient history, and epidemiological information. The expected result is Negative.  Fact Sheet for Patients:  PinkCheek.be  Fact Sheet for Healthcare Providers:  GravelBags.it  This test is no t yet approved or cleared by the [place] FDA and  has been authorized for detection and/or diagnosis of SARS-CoV-2 by FDA under an Emergency Use Authorization (EUA). This EUA will remain  in effect (meaning this test can be used) for the duration of the COVID-19 declaration under Section 564(b)(1) of the Act, 21 U.S.C. section 360bbb-3(b)(1), unless the authorization is terminated or revoked sooner.     Influenza A by PCR NEGATIVE NEGATIVE Final   Influenza B by PCR NEGATIVE NEGATIVE Final  Comment: (NOTE) The Xpert Xpress SARS-CoV-2/FLU/RSV assay is intended as an aid in  the diagnosis of influenza from Nasopharyngeal swab specimens and  should not be used as a sole basis for treatment. Nasal washings and  aspirates are unacceptable for Xpert Xpress SARS-CoV-2/FLU/RSV  testing.  Fact Sheet for Patients: PinkCheek.be  Fact Sheet for Healthcare Providers: GravelBags.it  This test is not yet approved or cleared by the [place] FDA and  has been authorized for detection and/or diagnosis of SARS-CoV-2 by  FDA under an Emergency Use Authorization (EUA). This EUA will remain  in effect (meaning this test can be used) for the duration of the  Covid-19 declaration under Section 564(b)(1) of the Act, 21  U.S.C. section 360bbb-3(b)(1), unless the authorization is  terminated or revoked. Performed at [facility], [address] [place], [place] [address]      Total time spend on discharging this  patient, including the last patient exam, discussing the hospital stay, instructions for ongoing care as it relates to all pertinent caregivers, as well as preparing the medical discharge records, prescriptions, and/or referrals as applicable, is 45 minutes.    Enzo Bi, MD  [facility] 06/11/2020, 10:20 AM  If 7PM-7AM, please contact night-coverage

## 2020-06-11 NOTE — ED Notes (Signed)
Pt repositioned in bed. Pt comfortable and resting at this time.

## 2020-06-11 NOTE — ED Notes (Signed)
Per Dr Billie Ruddy, pt ok for discharge

## 2020-06-11 NOTE — Progress Notes (Signed)
SLP Cancellation Note  Patient Details Name: DASHANTI BURR MRN: [id number] DOB: 03/24/1946   Cancelled treatment:       Reason Eval/Treat Not Completed: Other (comment)  Pt was screened and demonstrates good orientation, and is is able to express her wants and needs without any assistance. Recommend pt request follow-up cognitive training from neurologist if any further cognitive deficits arise. Pt voiced understanding.   Oiva Dibari B. Rutherford Nail M.S., CCC-SLP, [place] [facility] [phone]  Stormy Fabian 06/11/2020, 3:53 PM

## 2020-06-11 NOTE — Progress Notes (Signed)
*  PRELIMINARY RESULTS* Echocardiogram 2D Echocardiogram has been performed.  [name] 06/11/2020, 11:06 AM

## 2020-06-11 NOTE — ED Notes (Signed)
This tech provided pt with a menu.

## 2020-06-12 ENCOUNTER — Telehealth: Payer: Self-pay | Admitting: Internal Medicine

## 2020-06-12 NOTE — Telephone Encounter (Signed)
Dr.Letvak asked me to call patient to let her know he looked over her test results done at [facility]. He said there were no immediate concerns and he would go over the results at her appointment on 07/04/20.  I left a detailed message on patient's voice mail.

## 2020-06-13 ENCOUNTER — Telehealth: Payer: Self-pay | Admitting: *Deleted

## 2020-06-13 NOTE — Telephone Encounter (Signed)
Patient left a voicemail stating that someone left her a message telling her that she needs to schedule some kind of test. Patient stated that did not understand the message and was hoping that Larene Beach can help her figure it out. Patient requested that [facility] call her back.

## 2020-06-14 NOTE — Telephone Encounter (Signed)
Pt said it was about a colonoscopy. She is working on that.

## 2020-06-18 ENCOUNTER — Ambulatory Visit
Admission: RE | Admit: 2020-06-18 | Discharge: 2020-06-18 | Disposition: A | Discharge status: Home / Self Care | Payer: Medicare Other | Source: Ambulatory Visit | Attending: Nephrology | Admitting: Nephrology

## 2020-06-18 ENCOUNTER — Other Ambulatory Visit: Payer: Self-pay

## 2020-06-18 ENCOUNTER — Ambulatory Visit: Payer: Medicare Other

## 2020-06-18 DIAGNOSIS — N281 Cyst of kidney, acquired: Secondary | ICD-10-CM | POA: Diagnosis not present

## 2020-06-18 DIAGNOSIS — N1832 Chronic kidney disease, stage 3b: Secondary | ICD-10-CM | POA: Diagnosis not present

## 2020-06-18 DIAGNOSIS — E1122 Type 2 diabetes mellitus with diabetic chronic kidney disease: Secondary | ICD-10-CM | POA: Diagnosis not present

## 2020-06-19 DIAGNOSIS — M48062 Spinal stenosis, lumbar region with neurogenic claudication: Secondary | ICD-10-CM | POA: Diagnosis not present

## 2020-06-19 DIAGNOSIS — Z8673 Personal history of transient ischemic attack (TIA), and cerebral infarction without residual deficits: Secondary | ICD-10-CM | POA: Diagnosis not present

## 2020-06-19 DIAGNOSIS — M5136 Other intervertebral disc degeneration, lumbar region: Secondary | ICD-10-CM | POA: Diagnosis not present

## 2020-06-26 ENCOUNTER — Ambulatory Visit: Payer: Medicare Other | Admitting: Gastroenterology

## 2020-07-04 ENCOUNTER — Other Ambulatory Visit: Payer: Self-pay

## 2020-07-04 ENCOUNTER — Encounter: Payer: Self-pay | Admitting: Internal Medicine

## 2020-07-04 ENCOUNTER — Ambulatory Visit (INDEPENDENT_AMBULATORY_CARE_PROVIDER_SITE_OTHER): Payer: Medicare Other | Admitting: Internal Medicine

## 2020-07-04 VITALS — BP 106/68 | HR 55 | Temp 96.9°F | Ht 60.25 in

## 2020-07-04 DIAGNOSIS — E1121 Type 2 diabetes mellitus with diabetic nephropathy: Secondary | ICD-10-CM | POA: Diagnosis not present

## 2020-07-04 DIAGNOSIS — M48062 Spinal stenosis, lumbar region with neurogenic claudication: Secondary | ICD-10-CM

## 2020-07-04 DIAGNOSIS — G3184 Mild cognitive impairment, so stated: Secondary | ICD-10-CM | POA: Diagnosis not present

## 2020-07-04 DIAGNOSIS — Z8673 Personal history of transient ischemic attack (TIA), and cerebral infarction without residual deficits: Secondary | ICD-10-CM

## 2020-07-04 DIAGNOSIS — N1832 Chronic kidney disease, stage 3b: Secondary | ICD-10-CM | POA: Diagnosis not present

## 2020-07-04 DIAGNOSIS — F1120 Opioid dependence, uncomplicated: Secondary | ICD-10-CM

## 2020-07-04 DIAGNOSIS — Z0000 Encounter for general adult medical examination without abnormal findings: Secondary | ICD-10-CM

## 2020-07-04 DIAGNOSIS — F39 Unspecified mood [affective] disorder: Secondary | ICD-10-CM

## 2020-07-04 LAB — HM DIABETES FOOT EXAM

## 2020-07-04 MED ORDER — DULOXETINE HCL 30 MG PO CPEP: 30.0000 mg | ORAL_CAPSULE | Freq: Every day | ORAL | 3 refills | Status: DC

## 2020-07-04 NOTE — Progress Notes (Signed)
Hearing Screening   125Hz  250Hz  500Hz  1000Hz  2000Hz  3000Hz  4000Hz  6000Hz  8000Hz   Right ear:           Left ear:           Comments: Has hearing aids. Wearing them today.  Vision Screening Comments: April 2021

## 2020-07-04 NOTE — Assessment & Plan Note (Signed)
Did establish with Dr Holley Raring Is on lisinopril

## 2020-07-04 NOTE — Assessment & Plan Note (Signed)
Reactive depression and anxiety  Worse off the duloxetine Will start low dose again

## 2020-07-04 NOTE — Assessment & Plan Note (Signed)
Brief aphasia In hospital and MRI abnormal Now on clopidogrel and atorvastatin

## 2020-07-04 NOTE — Assessment & Plan Note (Signed)
I have personally reviewed the Medicare Annual Wellness questionnaire and have noted 1. The patient's medical and social history 2. Their use of alcohol, tobacco or illicit drugs 3. Their current medications and supplements 4. The patient's functional ability including ADL's, fall risks, home safety risks and hearing or visual             impairment. 5. Diet and physical activities 6. Evidence for depression or mood disorders  The patients weight, height, BMI and visual acuity have been recorded in the chart I have made referrals, counseling and provided education to the patient based review of the above and I have provided the pt with a written personalized care plan for preventive services.  I have provided you with a copy of your personalized plan for preventive services. Please take the time to review along with your updated medication list.  COVID booster next year Setting up colonoscopy next week Still prefers no mammogram--discussed Consider resistance bands Flu vaccine--had already

## 2020-07-04 NOTE — Progress Notes (Signed)
Subjective:    Patient ID: [name], female    DOB: Apr 27, 1946, 74 y.o.   MRN: [id number]  HPI Here for Medicare wellness visit and follow up of chronic health conditions This visit occurred during the SARS-CoV-2 public health emergency.  Safety protocols were in place, including screening questions prior to the visit, additional usage of staff PPE, and extensive cleaning of exam room while observing appropriate contact time as indicated for disinfecting solutions.   Reviewed advanced directives Reviewed other doctors----Dr Shah--neurology, Dr Fath--cardiology, Dr Lateef--nephrologist,  LTR dentist (Dr Marcello Moores), [facility], Dr Ellis Savage, Dr Vicente Males --GI Was hospitalized for neurologic symptoms---had CVA noted on MRI (but may not have been acute). Now on high dose statin No surgery No alcohol or tobacco Vision okay Hearing aides No exercise---just hard time moving around and doing household tasks Walks with walker Still does her instrumental ADLs Fell once--hit head with concussion. Another fall and scraped foot Chronic mood issues  Ongoing pain issues Concerned that tramadol is affecting her memory Has been diagnosed with mild cognitive impairment Has tried reducing the dose---but would have breakthrough pain Back, left hip, buttock, right knee Did go off the duloxetine---worried about warnings for serotonin syndrome Still has episodic depression---now with new stress Son just released from jail--has phone but now not answering it  She is very worried about him  Has seen Dr Holley Raring for her renal insufficiency Reviewed that she can't use NSAIDs  Checks sugars several times weekly Runs 74-100 No hypoglycemic reactions  Continues on BP meds Sees cardiologist but no CAD  No chest pain No SOB Does get left shoulder pain at times No syncope  Current Outpatient Medications on File Prior to Visit  Medication Sig Dispense Refill  . Accu-Chek Softclix Lancets  lancets Use as instructed 100 each 3  . acetaminophen (TYLENOL) 500 MG tablet Take 500 mg by mouth every 4 (four) hours as needed for mild pain or fever.     Marland Kitchen atorvastatin (LIPITOR) 80 MG tablet Take 1 tablet (80 mg total) by mouth daily. 30 tablet 2  . Cholecalciferol (VITAMIN D3) 25 MCG (1000 UT) CAPS Take 1,000 Units by mouth daily.     . Chromium Picolinate 1000 MCG TABS Take 1,000 mcg by mouth daily.     . Coenzyme Q10 (CO Q 10) 100 MG CAPS Take 100 mg by mouth daily.     . Digestive Enzymes CAPS Take 2 capsules by mouth daily.     . furosemide (LASIX) 20 MG tablet Take 20 mg by mouth daily as needed for edema.    . gabapentin (NEURONTIN) 300 MG capsule Take 600-900 mg by mouth See admin instructions. Take 2 capsules (600mg ) by mouth twice daily and take 3 capsules (900mg ) by mouth at bedtime. Pt states it makes her drowsy so she only takes the 3 at bedtime.    Marland Kitchen glucose blood (ONETOUCH VERIO) test strip Use to check blood sugar once a day Dx Code E11.40 100 each 12  . lisinopril-hydrochlorothiazide (ZESTORETIC) 20-25 MG tablet TAKE 2 TABLETS BY MOUTH EVERY DAY (Patient taking differently: Take 2 tablets by mouth daily. ) 180 tablet 3  . metFORMIN (GLUCOPHAGE-XR) 500 MG 24 hr tablet TAKE 1 TABLET BY MOUTH EVERY DAY WITH BREAKFAST (Patient taking differently: Take 500 mg by mouth daily with breakfast. ) 90 tablet 3  . Multiple Vitamins-Minerals (MULTIVITAMIN WITH MINERALS) tablet Take 1 tablet by mouth daily.     . Omega-3 Fatty Acids (FISH OIL) 1000 MG  CAPS Take 2,000 mg by mouth.    Edythe Lynn Lecithin 1200 MG CAPS Take 1,200 mg by mouth 3 (three) times daily.     . traMADol (ULTRAM) 50 MG tablet TAKE 2 TABLETS (100 MG TOTAL) BY MOUTH 3 (THREE) TIMES DAILY AS NEEDED. (Patient taking differently: Take 100 mg by mouth 3 (three) times daily as needed for moderate pain or severe pain. ) 180 tablet 0  . vitamin C (ASCORBIC ACID) 500 MG tablet Take 500 mg by mouth daily.    . clopidogrel (PLAVIX) 75 MG  tablet Take 1 tablet (75 mg total) by mouth daily. To help prevent stroke. (Patient not taking: Reported on 07/04/2020) 30 tablet 2   No current facility-administered medications on file prior to visit.    No Known Allergies  Past Medical History:  Diagnosis Date  . Arthritis   . Confusion   . Diabetes mellitus without complication ([place])    type 2  . Hepatitis    pt. denies  . Hypertension   . Lumbar stenosis   . Memory loss   . Nocturia   . Numbness and tingling    legs and feet  . Wears glasses     Past Surgical History:  Procedure Laterality Date  . [place]  . COLONOSCOPY    . HEMORRHOID SURGERY  2015  . JOINT REPLACEMENT     01-08-18 Dr. Kathrynn Speed  . TOTAL HIP ARTHROPLASTY Left 01/08/2018   Procedure: LEFT TOTAL HIP ARTHROPLASTY ANTERIOR APPROACH;  Surgeon: Mcarthur Rossetti, MD;  Location: [facility]S;  Service: Orthopedics;  Laterality: Left;  . TRANSFORAMINAL LUMBAR INTERBODY FUSION (TLIF) WITH PEDICLE SCREW FIXATION 2 LEVEL N/A 11/07/2015   Procedure: Lumbar three-four Lumbar four-five  transforaminal lumbar interbody fusion with interbody prosthesis posterior lateral arthrodesis and posterior segmental instrumentation;  Surgeon: Newman Pies, MD;  Location: [address] [facility] OR[facility];  Service: Neurosurgery;  Laterality: N/A;    Family History  Problem Relation Age of Onset  . Hypertension Other   . Cancer Other        ovarian  . Cancer Mother   . Heart disease Father     Social History   Socioeconomic History  . Marital status: Divorced    Spouse name: Not on file  . Number of children: 4  . Years of education: Not on file  . Highest education level: Associate degree: occupational, Hotel manager, or vocational program  Occupational History  . Not on file  Tobacco Use  . Smoking status: Never Smoker  . Smokeless tobacco: Never Used  Vaping Use  . Vaping Use: Never used  Substance and Sexual Activity  . Alcohol use: No    Comment: rarely  . Drug  use: No  . Sexual activity: Not Currently  Other Topics Concern  . Not on file  Social History Narrative   Has a living will.   Son is health care POA   Would accept resuscitation.     Would accept life support and tube feeds   Social Determinants of Health   Financial Resource Strain:   . Difficulty of Paying Living Expenses: Not on file  Food Insecurity:   . Worried About Charity fundraiser in the Last Year: Not on file  . Ran Out of Food in the Last Year: Not on file  Transportation Needs:   . Lack of Transportation (Medical): Not on file  . Lack of Transportation (Non-Medical): Not on file  Physical Activity:   . Days  of Exercise per Week: Not on file  . Minutes of Exercise per Session: Not on file  Stress:   . Feeling of Stress : Not on file  Social Connections:   . Frequency of Communication with Friends and Family: Not on file  . Frequency of Social Gatherings with Friends and Family: Not on file  . Attends Religious Services: Not on file  . Active Member of Clubs or Organizations: Not on file  . Attends Archivist Meetings: Not on file  . Marital Status: Not on file  Intimate Partner Violence:   . Fear of Current or Ex-Partner: Not on file  . Emotionally Abused: Not on file  . Physically Abused: Not on file  . Sexually Abused: Not on file   Review of Systems Appetite is high---weight stable Sleeps okay--nocturia x 2 Doesn't wear seat belt---counseled. Still drives Teeth okay No heartburn or dysphagia Bowels generally okay--uses fiber occasionally No dysuria or hematuria. Some incontinence---urge (wears pad) Had persistent bleeding after recent injury over varicosity of leg    Objective:   Physical Exam Constitutional:      Appearance: Normal appearance.  HENT:     Mouth/Throat:     Comments: No oral lesions Cardiovascular:     Rate and Rhythm: Normal rate and regular rhythm.     Heart sounds: No murmur heard.  No gallop.      Comments:  Faint pedal pulses Abdominal:     Palpations: Abdomen is soft.     Tenderness: There is no abdominal tenderness.  Musculoskeletal:     Cervical back: Neck supple.     Comments: 1+ ankle edema bilaterally Venous congestion in feet/lower calves  Lymphadenopathy:     Cervical: No cervical adenopathy.  Skin:    Comments: No foot ulcers  Neurological:     Mental Status: She is alert.     Comments: Decreased sensation in feet Recent cognitive assessments with neurology  Psychiatric:        Mood and Affect: Mood normal.        Behavior: Behavior normal.            Assessment & Plan:

## 2020-07-04 NOTE — Assessment & Plan Note (Signed)
Following with neurologist now

## 2020-07-04 NOTE — Assessment & Plan Note (Signed)
Concerned about the tramadol--no clear better alternatives Asked her to try to reduce the dose

## 2020-07-04 NOTE — Assessment & Plan Note (Signed)
Lab Results  Component Value Date   HGBA1C 6.1 (H) 06/11/2020   Good control on metformin No changes needed

## 2020-07-04 NOTE — Assessment & Plan Note (Signed)
Uses the tramadol regularly with some help but worries about it affecting memory, etc Will try to wean some

## 2020-07-05 DIAGNOSIS — E1122 Type 2 diabetes mellitus with diabetic chronic kidney disease: Secondary | ICD-10-CM | POA: Diagnosis not present

## 2020-07-05 DIAGNOSIS — I10 Essential (primary) hypertension: Secondary | ICD-10-CM | POA: Diagnosis not present

## 2020-07-05 DIAGNOSIS — N1831 Chronic kidney disease, stage 3a: Secondary | ICD-10-CM | POA: Diagnosis not present

## 2020-07-05 DIAGNOSIS — R600 Localized edema: Secondary | ICD-10-CM | POA: Diagnosis not present

## 2020-07-10 ENCOUNTER — Ambulatory Visit: Payer: Medicare Other | Admitting: Gastroenterology

## 2020-07-10 NOTE — Progress Notes (Deleted)
Jonathon Bellows MD, MRCP(U.K) [address]  [place], [place] [address]  Main: [phone]  Fax: [phone]   Gastroenterology Consultation  Referring Provider:     Venia Carbon, MD Primary Care Physician:  Venia Carbon, MD Primary Gastroenterologist:  Dr. Jonathon Bellows  Reason for Consultation:    Abdominal pain        HPI:   [name] is a 74 y.o. y/o female referred for consultation & management  by Dr. Viviana Simpler I, MD.    06/10/2020: Hemoglobin 14 g, CMP demonstrated a creatinine of 1.18 normal LFTs.  HbA1c of 6.1.  No recent abdominal imaging. Last colonoscopy in December 2011 showed diverticulosis of the sigmoid colon and descending colon..  Past Medical History:  Diagnosis Date  . Arthritis   . Confusion   . Diabetes mellitus without complication ([place])    type 2  . Hepatitis    pt. denies  . Hypertension   . Lumbar stenosis   . Memory loss   . Nocturia   . Numbness and tingling    legs and feet  . Wears glasses     Past Surgical History:  Procedure Laterality Date  . [place]  . COLONOSCOPY    . HEMORRHOID SURGERY  2015  . JOINT REPLACEMENT     01-08-18 Dr. Kathrynn Speed  . TOTAL HIP ARTHROPLASTY Left 01/08/2018   Procedure: LEFT TOTAL HIP ARTHROPLASTY ANTERIOR APPROACH;  Surgeon: Mcarthur Rossetti, MD;  Location: [facility];  Service: Orthopedics;  Laterality: Left;  . TRANSFORAMINAL LUMBAR INTERBODY FUSION (TLIF) WITH PEDICLE SCREW FIXATION 2 LEVEL N/A 11/07/2015   Procedure: Lumbar three-four Lumbar four-five  transforaminal lumbar interbody fusion with interbody prosthesis posterior lateral arthrodesis and posterior segmental instrumentation;  Surgeon: Newman Pies, MD;  Location: [facility];  Service: Neurosurgery;  Laterality: N/A;    Prior to Admission medications   Medication Sig Start Date End Date Taking? Authorizing Provider  Accu-Chek Softclix Lancets lancets Use as instructed 02/28/20   Venia Carbon, MD  acetaminophen (TYLENOL) 500 MG tablet Take 500 mg by mouth every 4 (four) hours as needed for mild pain or fever.     [provider]  atorvastatin (LIPITOR) 80 MG tablet Take 1 tablet (80 mg total) by mouth daily. 06/11/20 09/09/20  Enzo Bi, MD  Cholecalciferol (VITAMIN D3) 25 MCG (1000 UT) CAPS Take 1,000 Units by mouth daily.     [provider]  Chromium Picolinate 1000 MCG TABS Take 1,000 mcg by mouth daily.     [provider]  clopidogrel (PLAVIX) 75 MG tablet Take 1 tablet (75 mg total) by mouth daily. To help prevent stroke. Patient not taking: Reported on 07/04/2020 06/11/20 09/09/20  Enzo Bi, MD  Coenzyme Q10 (CO Q 10) 100 MG CAPS Take 100 mg by mouth daily.     [provider]  Digestive Enzymes CAPS Take 2 capsules by mouth daily.     [provider]  DULoxetine (CYMBALTA) 30 MG capsule Take 1 capsule (30 mg total) by mouth daily. 07/04/20   Venia Carbon, MD  furosemide (LASIX) 20 MG tablet Take 20 mg by mouth daily as needed for edema. 04/09/20 04/09/21  [provider]  gabapentin (NEURONTIN) 300 MG capsule Take 600-900 mg by mouth See admin instructions. Take 2 capsules (600mg ) by mouth twice daily and take 3 capsules (900mg ) by mouth at bedtime. Pt states it makes her drowsy so she  only takes the 3 at bedtime.    [provider]  glucose blood (ONETOUCH VERIO) test strip Use to check blood sugar once a day Dx Code E11.40 03/07/20   Venia Carbon, MD  lisinopril-hydrochlorothiazide (ZESTORETIC) 20-25 MG tablet TAKE 2 TABLETS BY MOUTH EVERY DAY Patient taking differently: Take 2 tablets by mouth daily.  08/10/19   Venia Carbon, MD  metFORMIN (GLUCOPHAGE-XR) 500 MG 24 hr tablet TAKE 1 TABLET BY MOUTH EVERY DAY WITH BREAKFAST Patient taking differently: Take 500 mg by mouth daily with breakfast.  11/03/19   Venia Carbon, MD  Multiple Vitamins-Minerals (MULTIVITAMIN WITH MINERALS) tablet Take 1 tablet  by mouth daily.     [provider]  Omega-3 Fatty Acids (FISH OIL) 1000 MG CAPS Take 2,000 mg by mouth.    [provider]  Soya Lecithin 1200 MG CAPS Take 1,200 mg by mouth 3 (three) times daily.     [provider]  traMADol (ULTRAM) 50 MG tablet TAKE 2 TABLETS (100 MG TOTAL) BY MOUTH 3 (THREE) TIMES DAILY AS NEEDED. Patient taking differently: Take 100 mg by mouth 3 (three) times daily as needed for moderate pain or severe pain.  06/07/20   Venia Carbon, MD  vitamin C (ASCORBIC ACID) 500 MG tablet Take 500 mg by mouth daily.    [provider]    Family History  Problem Relation Age of Onset  . Hypertension Other   . Cancer Other        ovarian  . Cancer Mother   . Heart disease Father      Social History   Tobacco Use  . Smoking status: Never Smoker  . Smokeless tobacco: Never Used  Vaping Use  . Vaping Use: Never used  Substance Use Topics  . Alcohol use: No    Comment: rarely  . Drug use: No    Allergies as of 07/10/2020  . (No Known Allergies)    Review of Systems:    All systems reviewed and negative except where noted in HPI.   Physical Exam:  There were no vitals taken for this visit. No LMP recorded. Patient is postmenopausal. Psych:  Alert and cooperative. Normal mood and affect. General:   Alert,  Well-developed, well-nourished, pleasant and cooperative in NAD Head:  Normocephalic and atraumatic. Eyes:  Sclera clear, no icterus.   Conjunctiva pink. Ears:  Normal auditory acuity. Nose:  No deformity, discharge, or lesions. Mouth:  No deformity or lesions,oropharynx pink & moist. Neck:  Supple; no masses or thyromegaly. Lungs:  Respirations even and unlabored.  Clear throughout to auscultation.   No wheezes, crackles, or rhonchi. No acute distress. Heart:  Regular rate and rhythm; no murmurs, clicks, rubs, or gallops. Abdomen:  Normal bowel sounds.  No bruits.  Soft, non-tender and non-distended without masses,  hepatosplenomegaly or hernias noted.  No guarding or rebound tenderness.    Msk:  Symmetrical without gross deformities. Good, equal movement & strength bilaterally. Pulses:  Normal pulses noted. Extremities:  No clubbing or edema.  No cyanosis. Neurologic:  Alert and oriented x3;  grossly normal neurologically. Skin:  Intact without significant lesions or rashes. No jaundice. Lymph Nodes:  No significant cervical adenopathy. Psych:  Alert and cooperative. Normal mood and affect.  Imaging Studies: US RENAL  Result Date: 06/19/2020 CLINICAL DATA:  Stage IIIB chronic kidney disease. EXAM: RENAL / URINARY TRACT ULTRASOUND COMPLETE COMPARISON:  Abdominal CT 07/21/2011 FINDINGS: Right Kidney: Renal measurements: 10.6 x 4.9 x 4.4  cm = volume: 117.3 mL. There is a subcentimeter cyst in the upper pole of the right kidney, mild renal cortical thinning and increased echogenicity. There is minimal dilatation of the right renal pelvis without caliectasis. Left Kidney: Renal measurements: 10.2 x 4.3 x 4.9 cm = volume: 111.9 mL. There is a cyst in the upper pole measuring up to 2.3 cm. There is an echogenic lesion in the lower pole which measures 14 mm maximally, likely an angiomyolipoma as correlated with previous CT. Mild renal cortical thinning and increased echogenicity with minimal dilatation of the renal pelvis. Bladder: Appears normal for degree of bladder distention. Other: None. IMPRESSION: 1. Minimal dilatation of the renal pelves bilaterally without caliectasis. 2. Both kidneys demonstrate mild renal cortical thinning and increased echogenicity, consistent with chronic medical renal disease. 3. Bilateral renal cysts. 4. Small echogenic lesion in the lower pole of the left kidney, likely an angiomyolipoma as correlated with previous CT. Electronically Signed   By: Richardean Sale M.D.   On: 06/19/2020 16:15   US Carotid Bilateral (at [facility] and [facility] only)  Result Date: 06/11/2020 CLINICAL DATA:  74 year old  female with history of stroke, hypertension, and diabetes. EXAM: BILATERAL CAROTID DUPLEX ULTRASOUND TECHNIQUE: Pearline Cables scale imaging, color Doppler and duplex ultrasound were performed of bilateral carotid and vertebral arteries in the neck. COMPARISON:  None. FINDINGS: Criteria: Quantification of carotid stenosis is based on velocity parameters that correlate the residual internal carotid diameter with NASCET-based stenosis levels, using the diameter of the distal internal carotid lumen as the denominator for stenosis measurement. The following velocity measurements were obtained: RIGHT ICA: Peak systolic velocity 55 cm/sec, End diastolic velocity 18 cm/sec CCA: Peak systolic velocity 59 cm/sec SYSTOLIC ICA/CCA RATIO:  0.9 ECA: Peak systolic velocity 73 cm/sec LEFT ICA: Peak systolic velocity 61 cm/sec, End diastolic velocity 16 cm/sec CCA: 56 cm/sec SYSTOLIC ICA/CCA RATIO:  1.1 ECA: 61 cm/sec RIGHT CAROTID ARTERY: Mild focal atherosclerotic plaque formation at the carotid bulb. No significant tortuosity. Normal low resistance waveforms. RIGHT VERTEBRAL ARTERY:  Antegrade flow. LEFT CAROTID ARTERY: No atherosclerotic plaque formation. No significant tortuosity. Normal low resistance waveforms. LEFT VERTEBRAL ARTERY:  Antegrade flow. Upper extremity non-invasive blood pressures: Not obtained. IMPRESSION: 1. Right carotid artery system: Less than 50% stenosis secondary to mild focal atherosclerotic plaque formation at the carotid bulb. 2. Left carotid artery system: Patent without significant atherosclerotic plaque formation. 3.  Vertebral artery system: Patent with antegrade flow bilaterally. Ruthann Cancer, MD [facility] Radiology Electronically Signed   By: Ruthann Cancer MD   On: 06/11/2020 10:40   ECHOCARDIOGRAM COMPLETE  Result Date: 06/11/2020    ECHOCARDIOGRAM REPORT   Patient Name:   [name] [facility] Date of Exam: 06/11/2020 Medical Rec #:  [id number]        Height:       61.0 in Accession #:    [id number]      Weight:       175.0 lb Date of Birth:  02/27/1946       BSA:          1.785 m Patient Age:    29 years        BP:           122/55 mmHg Patient Gender: F               HR:           70 bpm. Exam Location:  [facility] Procedure: 2D Echo, Cardiac Doppler and  Color Doppler Indications:     Stroke 434.91  History:         Patient has prior history of Echocardiogram examinations, most                  recent 04/16/2016. Risk Factors:Hypertension and Diabetes.  Sonographer:     Sherrie Sport RDCS (AE) Referring Phys:  Baker Janus Soledad Gerlach NIU Diagnosing Phys: Bartholome Bill MD  Sonographer Comments: Technically challenging study due to limited acoustic windows, no apical window and no subcostal window. IMPRESSIONS  1. Left ventricular ejection fraction, by estimation, is 60 to 65%. Left ventricular ejection fraction by PLAX is 64 %. The left ventricle has normal function. The left ventricle has no regional wall motion abnormalities. There is moderate left ventricular hypertrophy. Left ventricular diastolic parameters are consistent with Grade I diastolic dysfunction (impaired relaxation).  2. Right ventricular systolic function was not well visualized. The right ventricular size is normal.  3. Left atrial size was mildly dilated.  4. The mitral valve is grossly normal. Trivial mitral valve regurgitation.  5. The aortic valve was not well visualized. There is mild calcification of the aortic valve. Aortic valve regurgitation is not visualized. FINDINGS  Left Ventricle: Left ventricular ejection fraction, by estimation, is 60 to 65%. Left ventricular ejection fraction by PLAX is 64 %. The left ventricle has normal function. The left ventricle has no regional wall motion abnormalities. The left ventricular internal cavity size was normal in size. There is moderate left ventricular hypertrophy. Left ventricular diastolic parameters are consistent with Grade I diastolic dysfunction (impaired  relaxation). Right Ventricle: The right ventricular size is normal. No increase in right ventricular wall thickness. Right ventricular systolic function was not well visualized. Left Atrium: Left atrial size was mildly dilated. Right Atrium: Right atrial size was normal in size. Pericardium: There is no evidence of pericardial effusion. Mitral Valve: The mitral valve is grossly normal. Trivial mitral valve regurgitation. Tricuspid Valve: The tricuspid valve is not well visualized. Tricuspid valve regurgitation is trivial. Aortic Valve: The aortic valve was not well visualized. There is mild calcification of the aortic valve. Aortic valve regurgitation is not visualized. Pulmonic Valve: The pulmonic valve was not well visualized. Pulmonic valve regurgitation is not visualized. Aorta: The aortic root is normal in size and structure. IAS/Shunts: The interatrial septum was not well visualized.  LEFT VENTRICLE PLAX 2D LV EF:         Left ventricular ejection fraction by PLAX is 64 %. LVIDd:         4.14 cm LVIDs:         2.71 cm LV PW:         1.34 cm LV IVS:        1.83 cm LVOT diam:     2.20 cm LVOT Area:     3.80 cm  LEFT ATRIUM         Index LA diam:    4.30 cm 2.41 cm/m                        PULMONIC VALVE AORTA                 PV Vmax:        0.70 m/s Ao Root diam: 3.50 cm PV Peak grad:   1.9 mmHg                       RVOT Peak grad: 2 mmHg  SHUNTS Systemic Diam: 2.20 cm Bartholome Bill MD Electronically signed by Bartholome Bill MD Signature Date/Time: 06/11/2020/12:13:53 PM    Final     Assessment and Plan:   [name] is a 74 y.o. y/o female has been referred for ***  Follow up in ***  Dr Jonathon Bellows MD,MRCP(U.K)

## 2020-07-12 ENCOUNTER — Other Ambulatory Visit: Payer: Self-pay

## 2020-07-12 ENCOUNTER — Encounter (INDEPENDENT_AMBULATORY_CARE_PROVIDER_SITE_OTHER): Payer: Medicare Other | Admitting: Gastroenterology

## 2020-07-12 NOTE — Progress Notes (Signed)
error 

## 2020-07-13 ENCOUNTER — Other Ambulatory Visit: Payer: Self-pay | Admitting: Internal Medicine

## 2020-07-13 NOTE — Telephone Encounter (Signed)
Last filled 06-07-20 #180 Last OV 07-04-20 Next OV 10-05-20 [facility]

## 2020-07-23 NOTE — Progress Notes (Signed)
Cardiology Office Note  Date:  07/25/2020   ID:  July, [name] 12, 1947, MRN [id number]  PCP:  Venia Carbon, MD   Chief Complaint  Patient presents with  . Follow-up    12 month/Meds reviewed by the pt. verbally. "doing well."     HPI:  [name] is a 74 year old woman with history of  chronic back pain, back surgery in 2017 ,  glucose intolerance/diabetes type 2,  hypertension,  previously seen for abnormal EKG and preop clearance,  who presents for routine follow-up of her abnormal EKG, hypertension  Notes indicating more forgetful and confused over the last 3 months.  She states that she will forget words.  She will forget how to spell words and to spell them for her.  She will forget people I would wait for Friday sales) hello sales names.   Seen by Dr. Manuella Ghazi, urology Was felt to have a ABN MRI, possible stroke (artifact cannot be excluded per radiology)  Reports having a fall 1 month ago, hit her head  As part of the abnormal MRI she had echo and carotid ultrasound Echo reviewed with her Normal EF 60%  Carotid.  Reviewed with her 1. Right carotid artery system: Less than 50% stenosis secondary to mild focal atherosclerotic plaque formation at the carotid bulb. 2. Left carotid artery system: Patent without significant atherosclerotic plaque formation.  She reports that she is taking Lasix daily in the evening Review of lab work shows climbing her BUN and creatinine over the past year measured 4 times  Presents today with a walker Sedentary, no exercise program Lives alone, not much help from her children Relying on a neighbor to help her do chores around the house  EKG personally reviewed by myself on todays visit Shows normal sinus rhythm rate 78 bpm right bundle branch block, left anterior fascicular block   other past medical history reviewed  in 2017 reported fluttering  in her chest lasting for 10 seconds or so, happens several times per  year otherwise feels well most of the time.   PMH:   has a past medical history of Arthritis, Confusion, Diabetes mellitus without complication ([place]), Hepatitis, Hypertension, Lumbar stenosis, Memory loss, Nocturia, Numbness and tingling, and Wears glasses.  PSH:    Past Surgical History:  Procedure Laterality Date  . [place]  . COLONOSCOPY    . HEMORRHOID SURGERY  2015  . JOINT REPLACEMENT     01-08-18 Dr. Kathrynn Speed  . TOTAL HIP ARTHROPLASTY Left 01/08/2018   Procedure: LEFT TOTAL HIP ARTHROPLASTY ANTERIOR APPROACH;  Surgeon: Mcarthur Rossetti, MD;  Location: [facility];  Service: Orthopedics;  Laterality: Left;  . TRANSFORAMINAL LUMBAR INTERBODY FUSION (TLIF) WITH PEDICLE SCREW FIXATION 2 LEVEL N/A 11/07/2015   Procedure: Lumbar three-four Lumbar four-five  transforaminal lumbar interbody fusion with interbody prosthesis posterior lateral arthrodesis and posterior segmental instrumentation;  Surgeon: Newman Pies, MD;  Location: [facility];  Service: Neurosurgery;  Laterality: N/A;    Current Outpatient Medications  Medication Sig Dispense Refill  . Accu-Chek Softclix Lancets lancets Use as instructed 100 each 3  . acetaminophen (TYLENOL) 500 MG tablet Take 500 mg by mouth every 4 (four) hours as needed for mild pain or fever.     Marland Kitchen atorvastatin (LIPITOR) 80 MG tablet Take 1 tablet (80 mg total) by mouth daily. 30 tablet 2  . Cholecalciferol (VITAMIN D3) 25 MCG (1000 UT) CAPS Take 1,000 Units by mouth daily.     Marland Kitchen  Chromium Picolinate 1000 MCG TABS Take 1,000 mcg by mouth daily.     . clopidogrel (PLAVIX) 75 MG tablet Take 1 tablet (75 mg total) by mouth daily. To help prevent stroke. 30 tablet 2  . Coenzyme Q10 (CO Q 10) 100 MG CAPS Take 100 mg by mouth daily.     . Digestive Enzymes CAPS Take 2 capsules by mouth daily.     Marland Kitchen donepezil (ARICEPT) 5 MG tablet Take 5 mg by mouth at bedtime.     . DULoxetine (CYMBALTA) 30 MG capsule Take 1 capsule (30 mg total) by mouth  daily. 90 capsule 3  . furosemide (LASIX) 20 MG tablet Take 20 mg by mouth daily as needed for edema.    . gabapentin (NEURONTIN) 300 MG capsule TAKE TWO CAPSULES BY MOUTH TWICE A DAY TAKE 3 CAPSULES AT BEDTIME 630 capsule 4  . glucose blood (ONETOUCH VERIO) test strip Use to check blood sugar once a day Dx Code E11.40 100 each 12  . lisinopril-hydrochlorothiazide (ZESTORETIC) 20-25 MG tablet TAKE 2 TABLETS BY MOUTH EVERY DAY 180 tablet 3  . metFORMIN (GLUCOPHAGE-XR) 500 MG 24 hr tablet TAKE 1 TABLET BY MOUTH EVERY DAY WITH BREAKFAST 90 tablet 3  . Multiple Vitamins-Minerals (MULTIVITAMIN WITH MINERALS) tablet Take 1 tablet by mouth daily.     . Omega-3 Fatty Acids (FISH OIL) 1000 MG CAPS Take 2,000 mg by mouth.    Edythe Lynn Lecithin 1200 MG CAPS Take 1,200 mg by mouth 3 (three) times daily.     . traMADol (ULTRAM) 50 MG tablet TAKE 2 TABLETS (100 MG TOTAL) BY MOUTH 3 (THREE) TIMES DAILY AS NEEDED. 180 tablet 0  . vitamin C (ASCORBIC ACID) 500 MG tablet Take 500 mg by mouth daily.     No current facility-administered medications for this visit.     Allergies:   Patient has no known allergies.   Social History:  The patient  reports that she has never smoked. She has never used smokeless tobacco. She reports that she does not drink alcohol and does not use drugs.   Family History:   family history includes Cancer in her mother and another family member; Heart disease in her father; Hypertension in an other family member.    Review of Systems: Review of Systems  Constitutional: Negative.   Respiratory: Negative.   Cardiovascular: Negative.   Gastrointestinal: Negative.   Musculoskeletal: Positive for back pain.  Neurological: Negative.   Psychiatric/Behavioral: Negative.   All other systems reviewed and are negative.   PHYSICAL EXAM: VS:  BP (!) 148/86 (BP Location: Left Arm, Patient Position: Sitting, Cuff Size: Normal)   Pulse 77   Ht 5\' 2"  (1.575 m)   Wt 174 lb (78.9 kg)   SpO2  97%   BMI 31.83 kg/m  , BMI Body mass index is 31.83 kg/m. Constitutional:  oriented to person, place, and time. No distress.  HENT:  Head: Grossly normal Eyes:  no discharge. No scleral icterus.  Neck: No JVD, no carotid bruits  Cardiovascular: Regular rate and rhythm, no murmurs appreciated Pulmonary/Chest: Clear to auscultation bilaterally, no wheezes or rails Abdominal: Soft.  no distension.  no tenderness.  Musculoskeletal: Normal range of motion Neurological:  normal muscle tone. Coordination normal. No atrophy Skin: Skin warm and dry Psychiatric: normal affect, pleasant  Recent Labs: 06/10/2020: ALT 13; BUN 29; Creatinine, Ser 1.18; Hemoglobin 14.0; Platelets 274; Potassium 3.9; Sodium 139 06/11/2020: B Natriuretic Peptide 168.6    Lipid Panel Lab Results  Component Value Date   CHOL 176 06/11/2020   HDL 63 06/11/2020   LDLCALC 101 (H) 06/11/2020   TRIG 58 06/11/2020      Wt Readings from Last 3 Encounters:  07/25/20 174 lb (78.9 kg)  07/12/20 177 lb (80.3 kg)  06/10/20 175 lb (79.4 kg)     ASSESSMENT AND PLAN:  Essential hypertension, benign  BP mildly elevated today, will monitor for now Feels it is elevated from long walk into the office Will cut back on lasix as below  Type 2 diabetes, controlled, with neuropathy ([place]) -  Unable to exercise secondary to chronic pain  Chronic bilateral low back pain without sciatica Chronic issue, limiting her ADLs around the house  Leg swelling No swelling on today's visit, appears prerenal by lab work over the past year She is on HCTZ and Lasix daily, recommended she decrease Lasix to every other day and take this in the morning not in the evening as she is worried by nocturia  Bifascilcular block Right bundle branch block, left anterior fascicular block No significant change in EKG in the past year No further work-up at this time    Total encounter time more than 25 minutes  Greater than 50% was spent in  counseling and coordination of care with the patient    No orders of the defined types were placed in this encounter.    Signed, Esmond Plants, M.D., Ph.D. 07/25/2020  [place], [place]

## 2020-07-25 ENCOUNTER — Encounter: Payer: Self-pay | Admitting: Cardiovascular Disease

## 2020-07-25 ENCOUNTER — Other Ambulatory Visit: Payer: Self-pay

## 2020-07-25 ENCOUNTER — Ambulatory Visit (INDEPENDENT_AMBULATORY_CARE_PROVIDER_SITE_OTHER): Payer: Medicare Other | Admitting: Cardiovascular Disease

## 2020-07-25 VITALS — BP 148/86 | HR 77 | Ht 62.0 in | Wt 174.0 lb

## 2020-07-25 DIAGNOSIS — I10 Essential (primary) hypertension: Secondary | ICD-10-CM

## 2020-07-25 DIAGNOSIS — I872 Venous insufficiency (chronic) (peripheral): Secondary | ICD-10-CM | POA: Diagnosis not present

## 2020-07-25 DIAGNOSIS — I5032 Chronic diastolic (congestive) heart failure: Secondary | ICD-10-CM | POA: Diagnosis not present

## 2020-07-25 NOTE — Patient Instructions (Addendum)
Medication Instructions:  Please change furosemide to the AM/morning (from PM) Try no more than 3x a week, kidney number is elevated Ok to take little extra for ankle swelling  If you need a refill on your cardiac medications before your next appointment, please call your pharmacy.    Lab work: No new labs needed   If you have labs (blood work) drawn today and your tests are completely normal, you will receive your results only by: Marland Kitchen MyChart Message (if you have MyChart) OR . A paper copy in the mail If you have any lab test that is abnormal or we need to change your treatment, we will call you to review the results.   Testing/Procedures: No new testing needed   Follow-Up: At [facility], you and your health needs are our priority.  As part of our continuing mission to provide you with exceptional heart care, we have created designated Provider Care Teams.  These Care Teams include your primary Cardiologist (physician) and Advanced Practice Providers (APPs -  Physician Assistants and Nurse Practitioners) who all work together to provide you with the care you need, when you need it.  . You will need a follow up appointment as needed  . Providers on your designated Care Team:   . Murray Hodgkins, NP . Christell Faith, PA-C . Marrianne Mood, PA-C  Any Other Special Instructions Will Be Listed Below (If Applicable).  COVID-19 Vaccine Information can be found at: ShippingScam.co.uk For questions related to vaccine distribution or appointments, please email vaccine@[email or web address] or call [phone].

## 2020-08-08 MED ORDER — TRAMADOL HCL 50 MG PO TABS
100.0000 mg | ORAL_TABLET | Freq: Three times a day (TID) | ORAL | 0 refills | Status: DC | PRN
Start: 2020-08-08 — End: 2020-09-10

## 2020-08-08 NOTE — Telephone Encounter (Signed)
Please send her an FIT kit and let her know how to use it

## 2020-08-08 NOTE — Telephone Encounter (Signed)
Did you see the MyChart note about an in home colonoscopy? She is asking what she is supposed to do to get that.

## 2020-08-08 NOTE — Telephone Encounter (Signed)
The tramadol is a little early.  Last filled 07-13-20 #180 Last OV 07-04-20 Next OV 10-05-20 [facility]

## 2020-08-08 NOTE — Telephone Encounter (Signed)
Let her know that I filled it but I cannot fill it early again. She is supposed to limit this ----if she can't, we will have to discuss alternatives for her pain

## 2020-08-11 ENCOUNTER — Other Ambulatory Visit: Payer: Self-pay | Admitting: Internal Medicine

## 2020-08-16 NOTE — Telephone Encounter (Signed)
Spoke to pt. Mailed FOB kit.

## 2020-08-23 ENCOUNTER — Other Ambulatory Visit (INDEPENDENT_AMBULATORY_CARE_PROVIDER_SITE_OTHER): Payer: Medicare Other

## 2020-08-23 ENCOUNTER — Telehealth: Payer: Self-pay | Admitting: Radiology

## 2020-08-23 DIAGNOSIS — Z1211 Encounter for screening for malignant neoplasm of colon: Secondary | ICD-10-CM

## 2020-08-23 DIAGNOSIS — R195 Other fecal abnormalities: Secondary | ICD-10-CM

## 2020-08-23 LAB — FECAL OCCULT BLOOD, IMMUNOCHEMICAL: Fecal Occult Bld: POSITIVE — AB

## 2020-08-23 NOTE — Telephone Encounter (Signed)
I released the result on MyChart. Please call to find out if [name] is okay for colonoscopy

## 2020-08-23 NOTE — Telephone Encounter (Signed)
[facility] called a critical result, POSITIVE ifob, results given to Dr Silvio Pate

## 2020-08-24 NOTE — Telephone Encounter (Signed)
Dr Marlou Starks is a 58 a GI doctor. I went ahead with the referral to [facility]

## 2020-08-24 NOTE — Telephone Encounter (Signed)
Okay Referral changed to [facility] in [place]

## 2020-08-24 NOTE — Telephone Encounter (Signed)
Returning call.

## 2020-08-24 NOTE — Telephone Encounter (Signed)
Spoke to pt. She has been to  GI in the past. Asked about Dr Marlou Starks in [place].

## 2020-08-24 NOTE — Telephone Encounter (Signed)
Her reasoning for not choosing [facility] was she could not find their office. Spoke to pt, again,  And she said she cannot remember what kind of experience she had. She would like to go to [facility]. Whoever Dr Silvio Pate chooses.

## 2020-08-24 NOTE — Telephone Encounter (Signed)
Left message to call office

## 2020-08-24 NOTE — Telephone Encounter (Signed)
Make sure she had a good experience with them

## 2020-09-06 ENCOUNTER — Other Ambulatory Visit: Payer: Self-pay | Admitting: Internal Medicine

## 2020-09-07 ENCOUNTER — Other Ambulatory Visit: Payer: Self-pay | Admitting: Internal Medicine

## 2020-09-10 ENCOUNTER — Other Ambulatory Visit: Payer: Self-pay | Admitting: Internal Medicine

## 2020-09-10 NOTE — Telephone Encounter (Signed)
Last filled 08-08-20 #180 Last OV 07-04-20 Next OV 10-05-20 [facility]

## 2020-09-14 NOTE — Telephone Encounter (Signed)
Dr. Silvio Pate - please advise. Thank you.

## 2020-09-19 ENCOUNTER — Encounter: Payer: Self-pay | Admitting: Emergency Medicine

## 2020-09-19 ENCOUNTER — Emergency Department
Admission: EM | Admit: 2020-09-19 | Discharge: 2020-09-19 | Disposition: A | Discharge status: Home / Self Care | Payer: Medicare Other | Attending: Emergency Medicine | Admitting: Emergency Medicine

## 2020-09-19 ENCOUNTER — Other Ambulatory Visit: Payer: Self-pay

## 2020-09-19 DIAGNOSIS — I5032 Chronic diastolic (congestive) heart failure: Secondary | ICD-10-CM | POA: Insufficient documentation

## 2020-09-19 DIAGNOSIS — Z0130 Encounter for examination of blood pressure without abnormal findings: Secondary | ICD-10-CM

## 2020-09-19 DIAGNOSIS — I10 Essential (primary) hypertension: Secondary | ICD-10-CM | POA: Diagnosis not present

## 2020-09-19 DIAGNOSIS — I130 Hypertensive heart and chronic kidney disease with heart failure and stage 1 through stage 4 chronic kidney disease, or unspecified chronic kidney disease: Secondary | ICD-10-CM | POA: Diagnosis not present

## 2020-09-19 DIAGNOSIS — N181 Chronic kidney disease, stage 1: Secondary | ICD-10-CM | POA: Insufficient documentation

## 2020-09-19 DIAGNOSIS — Z0000 Encounter for general adult medical examination without abnormal findings: Secondary | ICD-10-CM | POA: Diagnosis not present

## 2020-09-19 DIAGNOSIS — E1122 Type 2 diabetes mellitus with diabetic chronic kidney disease: Secondary | ICD-10-CM | POA: Diagnosis not present

## 2020-09-19 DIAGNOSIS — Z79899 Other long term (current) drug therapy: Secondary | ICD-10-CM | POA: Diagnosis not present

## 2020-09-19 DIAGNOSIS — Z139 Encounter for screening, unspecified: Secondary | ICD-10-CM | POA: Insufficient documentation

## 2020-09-19 NOTE — ED Triage Notes (Signed)
First Nurse Note:  Arrives with C/O high blood pressure today.  AAOx3.  Skin warm and dry. NAD

## 2020-09-19 NOTE — ED Provider Notes (Signed)
[facility] Emergency Department Provider Note   ____________________________________________    I have reviewed the triage vital signs and the nursing notes.   HISTORY  Chief Complaint Hypertension     HPI [name] is a 75 y.o. female with a history of high blood pressure who does take antihypertensives presents for concern of elevated blood pressure.  Patient reports to blood pressure readings with her new blood pressure cuff for significantly elevated today around [id number] systolic.  She feels well and has no complaints, no chest pain shortness of breath headache or neurodeficits.  Reports compliance with her medications  Past Medical History:  Diagnosis Date  . Arthritis   . Confusion   . Diabetes mellitus without complication ([place])    type 2  . Hepatitis    pt. denies  . Hypertension   . Lumbar stenosis   . Memory loss   . Nocturia   . Numbness and tingling    legs and feet  . Wears glasses     Patient Active Problem List   Diagnosis Date Noted  . History of stroke 07/04/2020  . Chronic diastolic CHF (congestive heart failure) ([place]) 06/10/2020  . Stage 3b chronic kidney disease ([place]) 06/10/2020  . Type II diabetes mellitus with renal manifestations ([place]) 06/10/2020  . Abnormal MRI   . Facial laceration 02/28/2020  . Contusion, back 02/28/2020  . Dizziness 02/28/2020  . Chronic renal insufficiency, stage 1 11/11/2019  . Shoulder tendonitis, left 07/25/2019  . Chronic narcotic dependence ([place]) 07/04/2019  . Osteoarthritis of right knee 11/16/2018  . Urge incontinence 10/07/2018  . Mycotic toenails 10/07/2018  . Venous insufficiency 10/07/2018  . Unilateral primary osteoarthritis, left hip 01/08/2018  . Status post total replacement of left hip 01/08/2018  . Left hip pain 12/09/2017  . Pain syndrome, chronic 03/13/2017  . Facet arthropathy, lumbar 03/13/2017  . Advance directive discussed with patient 01/15/2016  . Spinal  stenosis of lumbar region with neurogenic claudication 11/07/2015  . Other intervertebral disc degeneration, lumbar region 06/01/2015  . Chronic back pain 03/29/2015  . Preventative health care 01/10/2015  . Osteoarthritis, hip, bilateral 11/28/2014  . Peripheral neuropathy 11/28/2014  . Type 2 diabetes, controlled, with neuropathy ([place]) 01/20/2014  . Episodic mood disorder ([place]) 01/20/2014  . MCI (mild cognitive impairment) 12/26/2013  . IBS (irritable bowel syndrome) 12/27/2012  . Essential hypertension, benign 08/06/2010    Past Surgical History:  Procedure Laterality Date  . [place]  . COLONOSCOPY    . HEMORRHOID SURGERY  2015  . JOINT REPLACEMENT     01-08-18 Dr. Kathrynn Speed  . TOTAL HIP ARTHROPLASTY Left 01/08/2018   Procedure: LEFT TOTAL HIP ARTHROPLASTY ANTERIOR APPROACH;  Surgeon: Mcarthur Rossetti, MD;  Location: [facility];  Service: Orthopedics;  Laterality: Left;  . TRANSFORAMINAL LUMBAR INTERBODY FUSION (TLIF) WITH PEDICLE SCREW FIXATION 2 LEVEL N/A 11/07/2015   Procedure: Lumbar three-four Lumbar four-five  transforaminal lumbar interbody fusion with interbody prosthesis posterior lateral arthrodesis and posterior segmental instrumentation;  Surgeon: Newman Pies, MD;  Location: [facility];  Service: Neurosurgery;  Laterality: N/A;    Prior to Admission medications   Medication Sig Start Date End Date Taking? Authorizing Provider  Accu-Chek Softclix Lancets lancets Use as instructed 02/28/20   Venia Carbon, MD  acetaminophen (TYLENOL) 500 MG tablet Take 500 mg by mouth every 4 (four) hours as needed for mild pain or fever.     [provider]  atorvastatin (  LIPITOR) 10 MG tablet TAKE 1 TABLET (10 MG TOTAL) BY MOUTH ONCE A WEEK. 09/06/20   Venia Carbon, MD  atorvastatin (LIPITOR) 80 MG tablet Take 1 tablet (80 mg total) by mouth daily. 06/11/20 09/09/20  Enzo Bi, MD  Cholecalciferol (VITAMIN D3) 25 MCG (1000 UT) CAPS Take 1,000 Units by  mouth daily.     [provider]  Chromium Picolinate 1000 MCG TABS Take 1,000 mcg by mouth daily.     [provider]  Coenzyme Q10 (CO Q 10) 100 MG CAPS Take 100 mg by mouth daily.     [provider]  Digestive Enzymes CAPS Take 2 capsules by mouth daily.     [provider]  donepezil (ARICEPT) 5 MG tablet Take 5 mg by mouth at bedtime.  07/20/20 08/19/20  [provider]  DULoxetine (CYMBALTA) 30 MG capsule Take 1 capsule (30 mg total) by mouth daily. 07/04/20   Venia Carbon, MD  furosemide (LASIX) 20 MG tablet Take 20 mg by mouth daily as needed for edema. 04/09/20 04/09/21  [provider]  gabapentin (NEURONTIN) 300 MG capsule TAKE TWO CAPSULES BY MOUTH TWICE A DAY TAKE 3 CAPSULES AT BEDTIME 07/13/20   Viviana Simpler I, MD  glucose blood (ONETOUCH VERIO) test strip Use to check blood sugar once a day Dx Code E11.40 03/07/20   Venia Carbon, MD  lisinopril-hydrochlorothiazide (ZESTORETIC) 20-25 MG tablet TAKE 2 TABLETS BY MOUTH EVERY DAY 09/09/20   Viviana Simpler I, MD  metFORMIN (GLUCOPHAGE-XR) 500 MG 24 hr tablet TAKE 1 TABLET BY MOUTH EVERY DAY WITH BREAKFAST 11/03/19   Venia Carbon, MD  Multiple Vitamins-Minerals (MULTIVITAMIN WITH MINERALS) tablet Take 1 tablet by mouth daily.     [provider]  Omega-3 Fatty Acids (FISH OIL) 1000 MG CAPS Take 2,000 mg by mouth.    [provider]  Soya Lecithin 1200 MG CAPS Take 1,200 mg by mouth 3 (three) times daily.     [provider]  traMADol (ULTRAM) 50 MG tablet TAKE 2 TABLETS (100 MG TOTAL) BY MOUTH 3 (THREE) TIMES DAILY AS NEEDED. 09/10/20   Venia Carbon, MD  vitamin C (ASCORBIC ACID) 500 MG tablet Take 500 mg by mouth daily.    [provider]     Allergies Patient has no known allergies.  Family History  Problem Relation Age of Onset  . Hypertension Other   . Cancer Other        ovarian  . Cancer Mother   . Heart disease Father      Social History Social History   Tobacco Use  . Smoking status: Never Smoker  . Smokeless tobacco: Never Used  Vaping Use  . Vaping Use: Never used  Substance Use Topics  . Alcohol use: No    Comment: rarely  . Drug use: No    Review of Systems  Constitutional: No fever/chills  CV: No chest pain Respiratory: No shortness of breath   Gastrointestinal: No abdominal pain.  No nausea, no vomiting.     Neurological: Negative for headaches     ____________________________________________   PHYSICAL EXAM:  VITAL SIGNS: ED Triage Vitals  Enc Vitals Group     BP 09/19/20 1117 125/82     Pulse Rate 09/19/20 1117 81     Resp 09/19/20 1117 18     Temp 09/19/20 1117 98.7 F (37.1 C)     Temp Source 09/19/20 1117 Oral     SpO2 09/19/20  1117 100 %     Weight 09/19/20 1119 79.4 kg (175 lb)     Height 09/19/20 1119 1.575 m (5\' 2" )     Head Circumference --      Peak Flow --      Pain Score 09/19/20 1119 0     Pain Loc --      Pain Edu? --      Excl. in [place]? --      Constitutional: Alert and oriented. No acute distress. Pleasant and interactive Eyes: Conjunctivae are normal.  Head: Atraumatic. Nose: No congestion/rhinnorhea. Mouth/Throat: Mucous membranes are moist.   Cardiovascular: Normal rate, regular rhythm.  Normal heart sounds Respiratory: Normal respiratory effort.  No retractions.  CTA B Genitourinary: deferred Musculoskeletal: No lower extremity tenderness nor edema.   Neurologic:  Normal speech and language. No gross focal neurologic deficits are appreciated.   Skin:  Skin is warm, dry and intact. No rash noted.   ____________________________________________   LABS (all labs ordered are listed, but only abnormal results are displayed)  Labs Reviewed - No data to  display ____________________________________________  EKG   ____________________________________________  RADIOLOGY   ____________________________________________   PROCEDURES  Procedure(s) performed: No  Procedures   Critical Care performed: No ____________________________________________   INITIAL IMPRESSION / ASSESSMENT AND PLAN / ED COURSE  Pertinent labs & imaging results that were available during my care of the patient were reviewed by me and considered in my medical decision making (see chart for details).  Patient well-appearing and asymptomatic, blood pressure here is normal, recommend continued compliance with medications, outpatient follow-up with PCP for recheck   ____________________________________________   FINAL CLINICAL IMPRESSION(S) / ED DIAGNOSES  Final diagnoses:  Encounter for medical screening examination  Blood pressure check      NEW MEDICATIONS STARTED DURING THIS VISIT:  Discharge Medication List as of 09/19/2020 11:39 AM       Note:  This document was prepared using Dragon voice recognition software and may include unintentional dictation errors.   Lavonia Drafts, MD 09/19/20 1318

## 2020-09-19 NOTE — Telephone Encounter (Signed)
Per chart review tab appears pt already been to [facility] ED.

## 2020-09-19 NOTE — Telephone Encounter (Signed)
[facility] - Client TELEPHONE ADVICE RECORD [facility] Patient Name: [name] Gender: Female DOB: 04-25-1946 Age: 75 Y 95 M 17 D Return Phone Number: [phone] (Primary) Address: [address] City/State/Zip: [name] [address] Client [facility] - Client Client Site [place] - Day Physician Viviana Simpler- MD Contact Type Call Who Is Calling Patient / Member / Family / Caregiver Call Type Triage / Clinical Relationship To Patient Self Return Phone Number [phone] (Primary) Chief Complaint Blood Pressure High Reason for Call Symptomatic / Request for [place] states her bp is 191/111 Translation No Nurse Assessment Nurse: Raphael Gibney, RN, Vanita Ingles Date/Time (Eastern Time): 09/19/2020 9:40:06 AM Confirm and document reason for call. If symptomatic, describe symptoms. ---Caller states her BP is 191/111 this am. She has a new BP cuff. her BP read high for 2 days. BP at dentist office was high also. no symptoms. Does the patient have any new or worsening symptoms? ---Yes Will a triage be completed? ---Yes Related visit to physician within the last 2 weeks? ---No Does the PT have any chronic conditions? (i.e. diabetes, asthma, this includes High risk factors for pregnancy, etc.) ---Yes List chronic conditions. ---possible CVA; diabetic Is this a behavioral health or substance abuse call? ---No Guidelines Guideline Title Affirmed Question Affirmed Notes Nurse Date/Time (Eastern Time) Blood Pressure - High Systolic BP >= 944 OR Diastolic >= 967 Stringer, RN, Vera 09/19/2020 9:44:51 AM Disp. Time Eilene Ghazi Time) Disposition Final User 09/19/2020 9:49:10 AM See PCP within 24 Hours Yes Raphael Gibney, RN, Doreatha Lew Disagree/Comply Comply Caller Understands Yes PreDisposition Call Doctor Care Advice Given Per Guideline PLEASE NOTE: All timestamps contained within  this report are represented as [place] Standard Time. CONFIDENTIALTY NOTICE: This fax transmission is intended only for the addressee. It contains information that is legally privileged, confidential or otherwise protected from use or disclosure. If you are not the intended recipient, you are strictly prohibited from reviewing, disclosing, copying using or disseminating any of this information or taking any action in reliance on or regarding this information. If you have received this fax in error, please notify us immediately by telephone so that we can arrange for its return to [place]. Phone: [phone], Toll-Free: [phone], Fax: [phone] Page: 2 of 2 Call Id: [id number] Care Advice Given Per Guideline SEE PCP WITHIN 24 HOURS: * IF OFFICE WILL BE OPEN: You need to be examined within the next 24 hours. Call your doctor (or NP/PA) when the office opens and make an appointment. CALL BACK IF: * Weakness or numbness of the face, arm or leg on one side of the body occurs * Difficulty walking, difficulty talking, or severe headache occurs * Chest pain or difficulty breathing occurs * You become worse CARE ADVICE given per High Blood Pressure (Adult) guideline. Comments User: Dannielle Burn, RN Date/Time Eilene Ghazi Time): 09/19/2020 9:55:40 AM Called office and spoke to receptionist and gave report that pt has BP 191/111. n symptoms. Triage outcome see physician within 24 hrs. no appts available until next week. Advised pt to go to urgent care. States she will. Referrals REFERRED TO PCP OFFICE

## 2020-09-19 NOTE — ED Triage Notes (Signed)
Pt comes into the ED via POV c/o HTN with readings at home being 195/110 and 187/103.  Pt denies any history of HTN but states she bought a new monitor.  Pt denies any CP, SHOB, or dizziness and is asymptomatic.  Pt in NAD at this time with even and unlabored respirations.

## 2020-09-25 ENCOUNTER — Telehealth: Payer: Self-pay

## 2020-09-25 NOTE — Telephone Encounter (Signed)
I spoke with pt after 09/26/20 appt with Dr Silvio Pate at 9:30 was cancelled. Pt scheduled video visit with DR G on 09/26/20 at 2:30 pm but pt wants to know if could get the video visit with DR Silvio Pate instead; no available appts at this time on 09/26/20 with Dr Silvio Pate. Pt said NO CP,SOB,H/A or dizziness and pt states walks with walker and cannot come into office due to weather. Pt request cb if can be worked in by Dr Silvio Pate; sending note to Dr Manning Charity CMA and Dr Darnell Level. UC & ED precautions given and pt voiced understanding.

## 2020-09-25 NOTE — Telephone Encounter (Signed)
[facility] - Client TELEPHONE ADVICE RECORD [facility] Patient Name: [name] Gender: Female DOB: 26-Dec-1945 Age: 75 Y 36 M 23 D Return Phone Number: [phone] (Primary) Address: [address] City/State/Zip: [name] [address] Client [facility] - Client Client Site [place] - Day Physician Viviana Simpler- MD Contact Type Call Who Is Calling Patient / Member / Family / Caregiver Call Type Triage / Clinical Relationship To Patient Self Return Phone Number [phone] (Primary) Chief Complaint CONFUSION - new onset Reason for Call Symptomatic / Request for Health Information Initial Comment emily states pt is refusing treatment, states she's very fatigued, mumble, almost lethargic, 190/110, seems a little confused Amyla Stief 09/28/1945 Translation No Nurse Assessment Nurse: Hassell Done, RN, Melanie Date/Time (Eastern Time): 09/25/2020 3:01:25 PM Confirm and document reason for call. If symptomatic, describe symptoms. ---Caller states she woke up at 4:30 am and she is tired. states she doesnt understand it but her BP is very good and she bought a blood pressure cuff and used it and it was high. she got her monitor and compared her BP with the dental office cuff. BP now is 190/110. Does the patient have any new or worsening symptoms? ---Yes Will a triage be completed? ---Yes Related visit to physician within the last 2 weeks? ---Yes Does the PT have any chronic conditions? (i.e. diabetes, asthma, this includes High risk factors for pregnancy, etc.) ---Yes List chronic conditions. ---diabetes high blood pressure Is this a behavioral health or substance abuse call? ---No Guidelines Guideline Title Affirmed Question Affirmed Notes Nurse Date/Time (Eastern Time) Blood Pressure - High Systolic BP >= 130 OR Diastolic >= 865 Hassell Done, RN, Threasa Beards 09/25/2020 3:08:09 PM Disp. Time Eilene Ghazi  Time) Disposition Final User 09/25/2020 2:54:34 PM Send to Urgent Queue Ilona Sorrel 09/25/2020 3:19:54 PM SEE PCP WITHIN 3 DAYS Yes Hassell Done, RN, Melanie PLEASE NOTE: All timestamps contained within this report are represented as [place] Standard Time. CONFIDENTIALTY NOTICE: This fax transmission is intended only for the addressee. It contains information that is legally privileged, confidential or otherwise protected from use or disclosure. If you are not the intended recipient, you are strictly prohibited from reviewing, disclosing, copying using or disseminating any of this information or taking any action in reliance on or regarding this information. If you have received this fax in error, please notify us immediately by telephone so that we can arrange for its return to [place]. Phone: [phone], Toll-Free: [phone], Fax: [phone] Page: 2 of 2 Call Id: [id number] [place] Disagree/Comply Comply Caller Understands Yes PreDisposition Call Doctor Care Advice Given Per Guideline SEE PCP WITHIN 3 DAYS: * You need to be seen within 2 or 3 days. HIGH BLOOD PRESSURE: * Untreated high blood pressure may cause damage to your heart, brain, kidneys, and eyes. CALL BACK IF: * Weakness or numbness of the face, arm or leg on one side of the body occurs * Difficulty walking, difficulty talking, or severe headache occurs * Chest pain or difficulty breathing occurs * Your blood pressure is over 180/110 * You become worse CARE ADVICE given per High Blood Pressure (Adult) guideline. Referrals REFERRED TO PCP OFFICE

## 2020-09-26 ENCOUNTER — Telehealth (INDEPENDENT_AMBULATORY_CARE_PROVIDER_SITE_OTHER): Payer: Medicare Other | Admitting: Family Medicine

## 2020-09-26 ENCOUNTER — Other Ambulatory Visit: Payer: Self-pay | Admitting: Internal Medicine

## 2020-09-26 ENCOUNTER — Encounter: Payer: Self-pay | Admitting: Family Medicine

## 2020-09-26 ENCOUNTER — Ambulatory Visit: Payer: Medicare Other | Admitting: Internal Medicine

## 2020-09-26 ENCOUNTER — Other Ambulatory Visit: Payer: Self-pay

## 2020-09-26 DIAGNOSIS — I10 Essential (primary) hypertension: Secondary | ICD-10-CM

## 2020-09-26 MED ORDER — HYDRALAZINE HCL 25 MG PO TABS: 25.0000 mg | ORAL_TABLET | Freq: Two times a day (BID) | ORAL | 0 refills | Status: DC | PRN

## 2020-09-26 NOTE — Progress Notes (Signed)
Patient ID: [name], female    DOB: Jul 28, 1946, 75 y.o.   MRN: [id number]  Virtual visit attempted through [employer], a video enabled telemedicine application. Due to national recommendations of social distancing due to COVID-19, a virtual visit is felt to be most appropriate for this patient at this time. Reviewed limitations, risks, security and privacy concerns of performing a virtual visit and the availability of in person appointments. I also reviewed that there may be a patient responsible charge related to this service. The patient agreed to proceed.   Interactive audio and video telecommunications were attempted between myself and [name], however failed due to lack of sound on video - we continued and completed visit with audio only (telephone).  Time: 2:45pm - 3:20pm   Patient location: home Provider location: [place] at [facility], office Persons participating in this virtual visit: patient, provider   If any vitals were documented, they were collected by patient at home unless specified below.    BP (!) 196/118   Ht 5\' 2"  (1.575 m)   Wt 174 lb 6 oz (79.1 kg)   BMI 31.89 kg/m   BP Readings from Last 3 Encounters:  09/26/20 (!) 196/118  09/19/20 125/82  07/25/20 (!) 148/86    CC: HTN Subjective:   HPI: [name] is a 75 y.o. female presenting on 09/26/2020 for Hypertension (C/o higher than normal BP readings. 190/110-yesterday, 187/119- today @ 6:00, 201/101- today @ 8:30. )   Recently elevated BP readings at home on new home arm BP cuff that she received from her insurance. BP at dentist's office was equivalent to her new home cuff.  Seen at ER on 09/19/2020 - with normal BP at that time 125/82.   Compliant with current antihypertensive regimen of lasix 20mg  QOD for edema, lisinopril hctz 20/25mg  2 tab daily. Does check blood pressures at home: recently markedly elevated readings. Denies low blood pressure readings or symptoms of dizziness/syncope.  Denies HA, vision changes, CP/tightness, SOB.   Notes increasing bruising to left calf without pain as well as swelling of ankles despite taking furosemide 20mg  QOD. Notes one bruise came from hitting car door. She is not on blood thinner.   Known diabetic on metformin XR 500mg  daily Lab Results  Component Value Date   HGBA1C 6.1 (H) 06/11/2020        Relevant past medical, surgical, family and social history reviewed and updated as indicated. Interim medical history since our last visit reviewed. Allergies and medications reviewed and updated. Outpatient Medications Prior to Visit  Medication Sig Dispense Refill  . Accu-Chek Softclix Lancets lancets Use as instructed 100 each 3  . acetaminophen (TYLENOL) 500 MG tablet Take 500 mg by mouth every 4 (four) hours as needed for mild pain or fever.     Marland Kitchen atorvastatin (LIPITOR) 10 MG tablet TAKE 1 TABLET (10 MG TOTAL) BY MOUTH ONCE A WEEK. 13 tablet 3  . Cholecalciferol (VITAMIN D3) 25 MCG (1000 UT) CAPS Take 1,000 Units by mouth daily.     . Chromium Picolinate 1000 MCG TABS Take 1,000 mcg by mouth daily.     . Coenzyme Q10 (CO Q 10) 100 MG CAPS Take 100 mg by mouth daily.     . Digestive Enzymes CAPS Take 2 capsules by mouth daily.     . DULoxetine (CYMBALTA) 30 MG capsule Take 1 capsule (30 mg total) by mouth daily. 90 capsule 3  . furosemide (LASIX) 20 MG tablet Take 20 mg  by mouth daily as needed for edema.    . gabapentin (NEURONTIN) 300 MG capsule TAKE TWO CAPSULES BY MOUTH TWICE A DAY TAKE 3 CAPSULES AT BEDTIME 630 capsule 4  . glucose blood (ONETOUCH VERIO) test strip Use to check blood sugar once a day Dx Code E11.40 100 each 12  . metFORMIN (GLUCOPHAGE-XR) 500 MG 24 hr tablet TAKE 1 TABLET BY MOUTH EVERY DAY WITH BREAKFAST 90 tablet 3  . Multiple Vitamins-Minerals (MULTIVITAMIN WITH MINERALS) tablet Take 1 tablet by mouth daily.     . Omega-3 Fatty Acids (FISH OIL) 1000 MG CAPS Take 2,000 mg by mouth.    Edythe Lynn Lecithin 1200 MG CAPS  Take 1,200 mg by mouth 3 (three) times daily.     . traMADol (ULTRAM) 50 MG tablet TAKE 2 TABLETS (100 MG TOTAL) BY MOUTH 3 (THREE) TIMES DAILY AS NEEDED. 180 tablet 0  . vitamin C (ASCORBIC ACID) 500 MG tablet Take 500 mg by mouth daily.    Marland Kitchen lisinopril-hydrochlorothiazide (ZESTORETIC) 20-25 MG tablet TAKE 2 TABLETS BY MOUTH EVERY DAY 180 tablet 3  . atorvastatin (LIPITOR) 80 MG tablet Take 1 tablet (80 mg total) by mouth daily. 30 tablet 2  . donepezil (ARICEPT) 5 MG tablet Take 5 mg by mouth at bedtime.     Marland Kitchen lisinopril-hydrochlorothiazide (ZESTORETIC) 20-25 MG tablet Take 1 tablet by mouth in the morning and at bedtime.     No facility-administered medications prior to visit.     Per HPI unless specifically indicated in ROS section below Review of Systems Objective:  BP (!) 196/118   Ht 5\' 2"  (1.575 m)   Wt 174 lb 6 oz (79.1 kg)   BMI 31.89 kg/m   Wt Readings from Last 3 Encounters:  09/26/20 174 lb 6 oz (79.1 kg)  09/19/20 175 lb (79.4 kg)  07/25/20 174 lb (78.9 kg)       Physical exam: Gen: alert, NAD, not ill appearing Pulm: speaks in complete sentences without increased work of breathing Psych: normal mood, normal thought content      Assessment & Plan:   Problem List Items Addressed This Visit    Essential hypertension, benign    Unclear if cuff issue or truly hypertensive.  Reassuring normal BP at ER visit last week.  She found old wrist cuff, replaced batteries while I was with her and rechecked BP - 218/120, HR 90.  I've asked her to change lisinopril/hctz to 20/25mg  1 tab BID and will add hydralazine 25mg  BID PRN SBP >160.  Advised to call us to schedule in-office eval and bring BP cuffs to appt.  Reviewed red flags to seek ER care, call 911.       Relevant Medications   hydrALAZINE (APRESOLINE) 25 MG tablet   lisinopril-hydrochlorothiazide (ZESTORETIC) 20-25 MG tablet       Meds ordered this encounter  Medications  . hydrALAZINE (APRESOLINE) 25 MG  tablet    Sig: Take 1 tablet (25 mg total) by mouth 2 (two) times daily as needed (bp >160/100).    Dispense:  30 tablet    Refill:  0   No orders of the defined types were placed in this encounter.   I discussed the assessment and treatment plan with the patient. The patient was provided an opportunity to ask questions and all were answered. The patient agreed with the plan and demonstrated an understanding of the instructions. The patient was advised to call back or seek an in-person evaluation if the symptoms worsen  or if the condition fails to improve as anticipated.  Follow up plan: No follow-ups on file.  Ria Bush, MD

## 2020-09-26 NOTE — Telephone Encounter (Signed)
I do not have anything today but can add her tomorrow (the same day slot or I plan to start early---so can give her 1:45)

## 2020-09-26 NOTE — Telephone Encounter (Signed)
I spoke to the pt. She still has a lot of ice around her house and unsure someone is coming to clean it up for her. Not sure she could make it for a visit tomorrow. She will keep the visit with Dr Danise Mina today.

## 2020-09-26 NOTE — Telephone Encounter (Signed)
I told her that in a MyChart message yesterday afternoon. We would be able to see her Thursday afternoon.

## 2020-09-26 NOTE — Assessment & Plan Note (Addendum)
Unclear if cuff issue or truly hypertensive.  Reassuring normal BP at ER visit last week.  She found old wrist cuff, replaced batteries while I was with her and rechecked BP - 218/120, HR 90.  I've asked her to change lisinopril/hctz to 20/25mg  1 tab BID and will add hydralazine 25mg  BID PRN SBP >160.  Advised to call us to schedule in-office eval and bring BP cuffs to appt.  Reviewed red flags to seek ER care, call 911.

## 2020-09-27 ENCOUNTER — Ambulatory Visit: Payer: Medicare Other | Admitting: Internal Medicine

## 2020-10-01 ENCOUNTER — Ambulatory Visit (INDEPENDENT_AMBULATORY_CARE_PROVIDER_SITE_OTHER): Payer: Medicare Other | Admitting: Internal Medicine

## 2020-10-01 ENCOUNTER — Encounter: Payer: Self-pay | Admitting: Internal Medicine

## 2020-10-01 ENCOUNTER — Other Ambulatory Visit: Payer: Self-pay

## 2020-10-01 DIAGNOSIS — I10 Essential (primary) hypertension: Secondary | ICD-10-CM | POA: Diagnosis not present

## 2020-10-01 MED ORDER — HYDRALAZINE HCL 25 MG PO TABS: 25.0000 mg | ORAL_TABLET | Freq: Four times a day (QID) | ORAL | 11 refills | Status: DC

## 2020-10-01 NOTE — Progress Notes (Signed)
Subjective:    Patient ID: [name], female    DOB: April 19, 1946, 75 y.o.   MRN: [id number]  HPI Here due to ongoing concerns about elevated blood pressures This visit occurred during the SARS-CoV-2 public health emergency.  Safety protocols were in place, including screening questions prior to the visit, additional usage of staff PPE, and extensive cleaning of exam room while observing appropriate contact time as indicated for disinfecting solutions.   She has been checking her BP regularly at home Very high repeatedly Has 2 home monitors---an arm cuff and a wrist cuff BP normal here---and both come back normal (correlated with ours)  Has been as high as 207/86 in the past day 162/88 this morning No headache No chest pain No SOB No dizziness or syncope  Has taken the hydralazine at times Did come down to 139/69 after taking (hasn't been taking it regularly)  Current Outpatient Medications on File Prior to Visit  Medication Sig Dispense Refill  . Accu-Chek Softclix Lancets lancets Use as instructed 100 each 3  . acetaminophen (TYLENOL) 500 MG tablet Take 500 mg by mouth every 4 (four) hours as needed for mild pain or fever.     Marland Kitchen atorvastatin (LIPITOR) 10 MG tablet TAKE 1 TABLET (10 MG TOTAL) BY MOUTH ONCE A WEEK. 13 tablet 3  . Cholecalciferol (VITAMIN D3) 25 MCG (1000 UT) CAPS Take 1,000 Units by mouth daily.     . Chromium Picolinate 1000 MCG TABS Take 1,000 mcg by mouth daily.     . Coenzyme Q10 (CO Q 10) 100 MG CAPS Take 100 mg by mouth daily.     . Digestive Enzymes CAPS Take 2 capsules by mouth daily.     . DULoxetine (CYMBALTA) 30 MG capsule Take 1 capsule (30 mg total) by mouth daily. 90 capsule 3  . furosemide (LASIX) 20 MG tablet Take 20 mg by mouth daily as needed for edema.    . gabapentin (NEURONTIN) 300 MG capsule TAKE TWO CAPSULES BY MOUTH TWICE A DAY TAKE 3 CAPSULES AT BEDTIME 630 capsule 4  . glucose blood (ONETOUCH VERIO) test strip Use to check blood  sugar once a day Dx Code E11.40 100 each 12  . hydrALAZINE (APRESOLINE) 25 MG tablet Take 1 tablet (25 mg total) by mouth 2 (two) times daily as needed (bp >160/100). 30 tablet 0  . lisinopril-hydrochlorothiazide (ZESTORETIC) 20-25 MG tablet Take 1 tablet by mouth in the morning and at bedtime.    . meloxicam (MOBIC) 15 MG tablet TAKE 1 TABLET BY MOUTH EVERY DAY 90 tablet 3  . metFORMIN (GLUCOPHAGE-XR) 500 MG 24 hr tablet TAKE 1 TABLET BY MOUTH EVERY DAY WITH BREAKFAST 90 tablet 3  . Multiple Vitamins-Minerals (MULTIVITAMIN WITH MINERALS) tablet Take 1 tablet by mouth daily.     . Omega-3 Fatty Acids (FISH OIL) 1000 MG CAPS Take 2,000 mg by mouth.    [name] Lecithin 1200 MG CAPS Take 1,200 mg by mouth 3 (three) times daily.     . traMADol (ULTRAM) 50 MG tablet TAKE 2 TABLETS (100 MG TOTAL) BY MOUTH 3 (THREE) TIMES DAILY AS NEEDED. 180 tablet 0  . vitamin C (ASCORBIC ACID) 500 MG tablet Take 500 mg by mouth daily.    Marland Kitchen atorvastatin (LIPITOR) 80 MG tablet Take 1 tablet (80 mg total) by mouth daily. 30 tablet 2  . donepezil (ARICEPT) 5 MG tablet Take 5 mg by mouth at bedtime.      No current facility-administered medications on  file prior to visit.    No Known Allergies  Past Medical History:  Diagnosis Date  . Arthritis   . Confusion   . Diabetes mellitus without complication ([place])    type 2  . Hepatitis    pt. denies  . Hypertension   . Lumbar stenosis   . Memory loss   . Nocturia   . Numbness and tingling    legs and feet  . Wears glasses     Past Surgical History:  Procedure Laterality Date  . [place]  . COLONOSCOPY    . HEMORRHOID SURGERY  2015  . JOINT REPLACEMENT     01-08-18 Dr. Kathrynn Speed  . TOTAL HIP ARTHROPLASTY Left 01/08/2018   Procedure: LEFT TOTAL HIP ARTHROPLASTY ANTERIOR APPROACH;  Surgeon: Mcarthur Rossetti, MD;  Location: [facility];  Service: Orthopedics;  Laterality: Left;  . TRANSFORAMINAL LUMBAR INTERBODY FUSION (TLIF) WITH PEDICLE SCREW  FIXATION 2 LEVEL N/A 11/07/2015   Procedure: Lumbar three-four Lumbar four-five  transforaminal lumbar interbody fusion with interbody prosthesis posterior lateral arthrodesis and posterior segmental instrumentation;  Surgeon: Newman Pies, MD;  Location: [facility];  Service: Neurosurgery;  Laterality: N/A;    Family History  Problem Relation Age of Onset  . Hypertension Other   . Cancer Other        ovarian  . Cancer Mother   . Heart disease Father     Social History   Socioeconomic History  . Marital status: Divorced    Spouse name: Not on file  . Number of children: 4  . Years of education: Not on file  . Highest education level: Associate degree: occupational, Hotel manager, or vocational program  Occupational History  . Not on file  Tobacco Use  . Smoking status: Never Smoker  . Smokeless tobacco: Never Used  Vaping Use  . Vaping Use: Never used  Substance and Sexual Activity  . Alcohol use: No    Comment: rarely  . Drug use: No  . Sexual activity: Not Currently  Other Topics Concern  . Not on file  Social History Narrative   Has a living will.   Son is health care POA   Would accept resuscitation.     Would accept life support and tube feeds   Social Determinants of Health   Financial Resource Strain: Not on file  Food Insecurity: Not on file  Transportation Needs: Not on file  Physical Activity: Not on file  Stress: Not on file  Social Connections: Not on file  Intimate Partner Violence: Not on file   Review of Systems  Gets some arm aching at times Usually sleeps okay Sleeps fine----"too much"     Objective:   Physical Exam Constitutional:      Appearance: Normal appearance.  Cardiovascular:     Rate and Rhythm: Regular rhythm. Bradycardia present.     Heart sounds: No murmur heard. No gallop.   Musculoskeletal:     Comments: Slight ankle/foot edema---purplish discoloratioin  Neurological:     Mental Status: She is alert.             Assessment & Plan:

## 2020-10-01 NOTE — Assessment & Plan Note (Signed)
BP Readings from Last 3 Encounters:  10/01/20 122/64  09/26/20 (!) 196/118  09/19/20 125/82   Appears to have true masked HTN---with confirmed high readings at home but normal here and when she went to the ER Will add the hydralazine regularly tid and see her back in 1 month She can continue to monitor BP at home

## 2020-10-01 NOTE — Telephone Encounter (Signed)
Made pt an appt for 145 today

## 2020-10-04 ENCOUNTER — Other Ambulatory Visit: Payer: Self-pay | Admitting: Family Medicine

## 2020-10-05 ENCOUNTER — Ambulatory Visit: Payer: Medicare Other | Admitting: Internal Medicine

## 2020-10-10 DIAGNOSIS — I83893 Varicose veins of bilateral lower extremities with other complications: Secondary | ICD-10-CM | POA: Diagnosis not present

## 2020-11-02 ENCOUNTER — Ambulatory Visit (INDEPENDENT_AMBULATORY_CARE_PROVIDER_SITE_OTHER): Payer: Medicare Other | Admitting: Internal Medicine

## 2020-11-02 ENCOUNTER — Encounter: Payer: Self-pay | Admitting: Internal Medicine

## 2020-11-02 ENCOUNTER — Other Ambulatory Visit: Payer: Self-pay

## 2020-11-02 DIAGNOSIS — I10 Essential (primary) hypertension: Secondary | ICD-10-CM | POA: Diagnosis not present

## 2020-11-02 NOTE — Assessment & Plan Note (Signed)
BP Readings from Last 3 Encounters:  11/02/20 128/80  10/01/20 122/64  09/26/20 (!) 196/118   Good control on hydralazine and lisinopril HCTZ No changes now

## 2020-11-02 NOTE — Progress Notes (Signed)
Subjective:    Patient ID: [name], female    DOB: 1946-08-28, 75 y.o.   MRN: [id number]  HPI  Here for follow up of HTN This visit occurred during the SARS-CoV-2 public health emergency.  Safety protocols were in place, including screening questions prior to the visit, additional usage of staff PPE, and extensive cleaning of exam room while observing appropriate contact time as indicated for disinfecting solutions.   Doing better Checking BP at home--- this week 150/85 or so Higher before that Is taking the hydralazine 4 times a day Still taking lisinopril/HCTZ Has been off the meloxicam  No chest pain No SOB Slight dizziness but not striking. No syncope Edema is much better--wearing support socks  Current Outpatient Medications on File Prior to Visit  Medication Sig Dispense Refill  . Accu-Chek Softclix Lancets lancets Use as instructed 100 each 3  . acetaminophen (TYLENOL) 500 MG tablet Take 500 mg by mouth every 4 (four) hours as needed for mild pain or fever.     . Cholecalciferol (VITAMIN D3) 25 MCG (1000 UT) CAPS Take 1,000 Units by mouth daily.     . Chromium Picolinate 1000 MCG TABS Take 1,000 mcg by mouth daily.     . Coenzyme Q10 (CO Q 10) 100 MG CAPS Take 100 mg by mouth daily.     . Digestive Enzymes CAPS Take 2 capsules by mouth daily.     . DULoxetine (CYMBALTA) 30 MG capsule Take 1 capsule (30 mg total) by mouth daily. 90 capsule 3  . furosemide (LASIX) 20 MG tablet Take 20 mg by mouth daily as needed for edema.    . gabapentin (NEURONTIN) 300 MG capsule TAKE TWO CAPSULES BY MOUTH TWICE A DAY TAKE 3 CAPSULES AT BEDTIME 630 capsule 4  . glucose blood (ONETOUCH VERIO) test strip Use to check blood sugar once a day Dx Code E11.40 100 each 12  . hydrALAZINE (APRESOLINE) 25 MG tablet Take 1 tablet (25 mg total) by mouth 4 (four) times daily. 120 tablet 11  . lisinopril-hydrochlorothiazide (ZESTORETIC) 20-25 MG tablet Take 1 tablet by mouth in the morning and at  bedtime.    . metFORMIN (GLUCOPHAGE-XR) 500 MG 24 hr tablet TAKE 1 TABLET BY MOUTH EVERY DAY WITH BREAKFAST 90 tablet 3  . Multiple Vitamins-Minerals (MULTIVITAMIN WITH MINERALS) tablet Take 1 tablet by mouth daily.     . Omega-3 Fatty Acids (FISH OIL) 1000 MG CAPS Take 2,000 mg by mouth.    Edythe Lynn Lecithin 1200 MG CAPS Take 1,200 mg by mouth 3 (three) times daily.     . traMADol (ULTRAM) 50 MG tablet TAKE 2 TABLETS (100 MG TOTAL) BY MOUTH 3 (THREE) TIMES DAILY AS NEEDED. 180 tablet 0  . vitamin C (ASCORBIC ACID) 500 MG tablet Take 500 mg by mouth daily.    Marland Kitchen atorvastatin (LIPITOR) 80 MG tablet Take 1 tablet (80 mg total) by mouth daily. 30 tablet 2  . donepezil (ARICEPT) 5 MG tablet Take 5 mg by mouth at bedtime.      No current facility-administered medications on file prior to visit.    No Known Allergies  Past Medical History:  Diagnosis Date  . Arthritis   . Confusion   . Diabetes mellitus without complication ([place])    type 2  . Hepatitis    pt. denies  . Hypertension   . Lumbar stenosis   . Memory loss   . Nocturia   . Numbness and tingling  legs and feet  . Wears glasses     Past Surgical History:  Procedure Laterality Date  . [place]  . COLONOSCOPY    . HEMORRHOID SURGERY  2015  . JOINT REPLACEMENT     01-08-18 Dr. Kathrynn Speed  . TOTAL HIP ARTHROPLASTY Left 01/08/2018   Procedure: LEFT TOTAL HIP ARTHROPLASTY ANTERIOR APPROACH;  Surgeon: Mcarthur Rossetti, MD;  Location: [facility];  Service: Orthopedics;  Laterality: Left;  . TRANSFORAMINAL LUMBAR INTERBODY FUSION (TLIF) WITH PEDICLE SCREW FIXATION 2 LEVEL N/A 11/07/2015   Procedure: Lumbar three-four Lumbar four-five  transforaminal lumbar interbody fusion with interbody prosthesis posterior lateral arthrodesis and posterior segmental instrumentation;  Surgeon: Newman Pies, MD;  Location: [facility];  Service: Neurosurgery;  Laterality: N/A;    Family History  Problem Relation Age of Onset  .  Hypertension Other   . Cancer Other        ovarian  . Cancer Mother   . Heart disease Father     Social History   Socioeconomic History  . Marital status: Divorced    Spouse name: Not on file  . Number of children: 4  . Years of education: Not on file  . Highest education level: Associate degree: occupational, Hotel manager, or vocational program  Occupational History  . Not on file  Tobacco Use  . Smoking status: Never Smoker  . Smokeless tobacco: Never Used  Vaping Use  . Vaping Use: Never used  Substance and Sexual Activity  . Alcohol use: No    Comment: rarely  . Drug use: No  . Sexual activity: Not Currently  Other Topics Concern  . Not on file  Social History Narrative   Has a living will.   Son is health care POA   Would accept resuscitation.     Would accept life support and tube feeds   Social Determinants of Health   Financial Resource Strain: Not on file  Food Insecurity: Not on file  Transportation Needs: Not on file  Physical Activity: Not on file  Stress: Not on file  Social Connections: Not on file  Intimate Partner Violence: Not on file   Review of Systems  Appetite is fine Sleeps okay---usually gets up early (like 5:30) Takes afternoon nap after lunch (1 hour) Does have colonoscopy on 3/23     Objective:   Physical Exam Constitutional:      Appearance: Normal appearance.  Cardiovascular:     Rate and Rhythm: Normal rate and regular rhythm.     Heart sounds: No murmur heard. No gallop.   Pulmonary:     Effort: Pulmonary effort is normal.     Breath sounds: Normal breath sounds. No wheezing or rales.  Musculoskeletal:     Cervical back: Neck supple.     Right lower leg: No edema.     Left lower leg: No edema.  Lymphadenopathy:     Cervical: No cervical adenopathy.  Neurological:     Mental Status: She is alert.            Assessment & Plan:

## 2020-11-13 ENCOUNTER — Other Ambulatory Visit: Payer: Self-pay | Admitting: Internal Medicine

## 2020-11-13 MED ORDER — TRAMADOL HCL 50 MG PO TABS: 100.0000 mg | ORAL_TABLET | Freq: Three times a day (TID) | ORAL | 0 refills | Status: DC | PRN

## 2020-11-13 NOTE — Telephone Encounter (Signed)
Pt sent a previous MyChart message stating her BP is still elevated. The last week of readings include 145/117, 111/56, 172/90, 183/104, 172/98, 155/89, 164/108.  Since Dr Silvio Pate is out of the office, I will forward this to [facility].

## 2020-11-13 NOTE — Telephone Encounter (Signed)
Does she have follow up with Dr. Silvio Pate planned?

## 2020-11-13 NOTE — Telephone Encounter (Signed)
Last filled 09-10-20  #180 Last OV 11-02-20 Next OV 06-27-21 [facility]

## 2020-11-13 NOTE — Telephone Encounter (Signed)
Her last 2 BP here were great. Recommend coming in for visit, bring BP machine to compare

## 2020-11-13 NOTE — Telephone Encounter (Signed)
We have already done that so we know it is fairly accurate.

## 2020-11-14 ENCOUNTER — Telehealth: Payer: Self-pay | Admitting: Internal Medicine

## 2020-11-14 MED ORDER — AMLODIPINE BESYLATE 5 MG PO TABS: 5.0000 mg | ORAL_TABLET | Freq: Every day | ORAL | 0 refills | Status: DC

## 2020-11-14 NOTE — Telephone Encounter (Signed)
Given  the fact that she is on lasix 20 mg and Lisonpril HCT 20-12.5 and history of CKD, would not want to go up on diuretics. Could consider adding Amlodipine 5 mg daily if she is agreeable.

## 2020-11-14 NOTE — Telephone Encounter (Signed)
Patient is calling in about wanting to take the following medications: Amlodipine  She says "you Know" what she is talking about. Patient refused to speak to me about it and said she didn't want to wait 24-48 hours for you to respond and "she wants it now". Patient became very upset and started crying. Please return her call asap. EM

## 2020-11-14 NOTE — Telephone Encounter (Signed)
Spoke to pt and advised her I was working from another office and was unsure if Rollene Fare was there. She insisted that I find out and hung up on me. I reached out to [facility] and she had left but was kind enough to authorize amlodipine 5mg  #30/0. [facility]. Called pt to let her know.

## 2020-11-15 DIAGNOSIS — I10 Essential (primary) hypertension: Secondary | ICD-10-CM | POA: Diagnosis not present

## 2020-11-15 DIAGNOSIS — N1831 Chronic kidney disease, stage 3a: Secondary | ICD-10-CM | POA: Diagnosis not present

## 2020-11-15 DIAGNOSIS — E1122 Type 2 diabetes mellitus with diabetic chronic kidney disease: Secondary | ICD-10-CM | POA: Diagnosis not present

## 2020-11-15 DIAGNOSIS — R600 Localized edema: Secondary | ICD-10-CM | POA: Diagnosis not present

## 2020-11-19 NOTE — Telephone Encounter (Signed)
There are several MyChart messages from last week that she sent and Regina placed her on amlodipine 5mg .

## 2020-11-22 ENCOUNTER — Ambulatory Visit: Payer: Medicare Other

## 2020-11-25 NOTE — Progress Notes (Addendum)
Cardiology Office Note:    Date:  11/26/2020   ID:  [name], DOB [birth year], MRN [id number]  PCP:  Venia Carbon, MD  [facility] Cardiologist:  No primary care provider on file.  [facility] Electrophysiologist:  None   Referring MD: Venia Carbon, MD   Chief Complaint: Irregular heart rhthym  History of Present Illness:    [name] is a 75 y.o. female with a hx of chronic back pain, DM2, HTN, possible stroke, RBBB with LAFB, venous insufficiency who presents for follow-up.   The patient was previously seen by [facility] and more recently started following with Dr. Rockey Situ.  Echo in 2017 showed normal LV function 55-60%, mild LVH, G1DD. She takes lasix as needed for venous insufficiency.   Echo 06/2020 showed LVEF 60-65%, mod LVH, G1DD, mildlyl dilated LA, trivial MR. US carotids 06/2020 showed RCA less than 50% stenosis secondary to mild focal atherosclreotic plaque formation at the carotid bulb, LCA paent.   Last seen 07/25/20 and lasix was decreased. Since then she has started following with Vascular/Vein specialists in [facility] for venous stasis and lower leg edema.   She recently saw her kidney doctors and they noted an irregular heart rhythm and she was sent to cardiology.   Today, EKG today shows SR with PACs. Patient is completely asymptomatic, no palpitations, chest pain, sob. The patient reports blood pressures have been high, 150/100, unsure what heart rate is. Heart rate on EKG 90bpm. Her PCP is currently following her BP. Hydralazine and amlodipine recently added, PCP will continue to follow. She follows with Vascular specialist for lower leg edema. She started wearing compression socks 1 month ago which has really improved lower leg swelling. Elevates feet also. She was taking lasix as needed for lower leg edema, but has not needed it in the last month. Denies orthopnea, pnd. Occasional dizziness or lightheadedness when she stands up, very  rare.     Past Medical History:  Diagnosis Date  . Arthritis   . Confusion   . Diabetes mellitus without complication ([place])    type 2  . Hepatitis    pt. denies  . Hypertension   . Lumbar stenosis   . Memory loss   . Nocturia   . Numbness and tingling    legs and feet  . Wears glasses     Past Surgical History:  Procedure Laterality Date  . [place]  . COLONOSCOPY    . HEMORRHOID SURGERY  2015  . JOINT REPLACEMENT     01-08-18 Dr. Kathrynn Speed  . TOTAL HIP ARTHROPLASTY Left 01/08/2018   Procedure: LEFT TOTAL HIP ARTHROPLASTY ANTERIOR APPROACH;  Surgeon: Mcarthur Rossetti, MD;  Location: [facility];  Service: Orthopedics;  Laterality: Left;  . TRANSFORAMINAL LUMBAR INTERBODY FUSION (TLIF) WITH PEDICLE SCREW FIXATION 2 LEVEL N/A 11/07/2015   Procedure: Lumbar three-four Lumbar four-five  transforaminal lumbar interbody fusion with interbody prosthesis posterior lateral arthrodesis and posterior segmental instrumentation;  Surgeon: Newman Pies, MD;  Location: [place] [facility];  Service: Neurosurgery;  Laterality: N/A;    Current Medications: Current Meds  Medication Sig  . Accu-Chek Softclix Lancets lancets Use as instructed  . acetaminophen (TYLENOL) 500 MG tablet Take 500 mg by mouth every 4 (four) hours as needed for mild pain or fever.   Marland Kitchen amLODipine (NORVASC) 5 MG tablet Take 1 tablet (5 mg total) by mouth daily.  Marland Kitchen atorvastatin (LIPITOR) 80 MG tablet Take 1 tablet (80 mg total)  by mouth daily.  . Cholecalciferol (VITAMIN D3) 25 MCG (1000 UT) CAPS Take 1,000 Units by mouth daily.   . Chromium Picolinate 1000 MCG TABS Take 1,000 mcg by mouth daily.   . Coenzyme Q10 (CO Q 10) 100 MG CAPS Take 100 mg by mouth daily.   . Digestive Enzymes CAPS Take 2 capsules by mouth daily.   Marland Kitchen donepezil (ARICEPT) 5 MG tablet Take 5 mg by mouth at bedtime.   . DULoxetine (CYMBALTA) 30 MG capsule Take 1 capsule (30 mg total) by mouth daily.  . furosemide (LASIX) 20 MG tablet Take  20 mg by mouth daily as needed for edema.  . gabapentin (NEURONTIN) 300 MG capsule TAKE TWO CAPSULES BY MOUTH TWICE A DAY TAKE 3 CAPSULES AT BEDTIME  . glucose blood (ONETOUCH VERIO) test strip Use to check blood sugar once a day Dx Code E11.40  . hydrALAZINE (APRESOLINE) 25 MG tablet Take 1 tablet (25 mg total) by mouth 4 (four) times daily.  Marland Kitchen lisinopril-hydrochlorothiazide (ZESTORETIC) 20-25 MG tablet Take 1 tablet by mouth in the morning and at bedtime.  . metFORMIN (GLUCOPHAGE-XR) 500 MG 24 hr tablet TAKE 1 TABLET BY MOUTH EVERY DAY WITH BREAKFAST  . Multiple Vitamins-Minerals (MULTIVITAMIN WITH MINERALS) tablet Take 1 tablet by mouth daily.   . Omega-3 Fatty Acids (FISH OIL) 1000 MG CAPS Take 2,000 mg by mouth.  Edythe Lynn Lecithin 1200 MG CAPS Take 1,200 mg by mouth 3 (three) times daily.   . traMADol (ULTRAM) 50 MG tablet Take 2 tablets (100 mg total) by mouth 3 (three) times daily as needed.  . vitamin C (ASCORBIC ACID) 500 MG tablet Take 500 mg by mouth daily.     Allergies:   Patient has no known allergies.   Social History   Socioeconomic History  . Marital status: Divorced    Spouse name: Not on file  . Number of children: 4  . Years of education: Not on file  . Highest education level: Associate degree: occupational, Hotel manager, or vocational program  Occupational History  . Not on file  Tobacco Use  . Smoking status: Never Smoker  . Smokeless tobacco: Never Used  Vaping Use  . Vaping Use: Never used  Substance and Sexual Activity  . Alcohol use: No    Comment: rarely  . Drug use: No  . Sexual activity: Not Currently  Other Topics Concern  . Not on file  Social History Narrative   Has a living will.   Son is health care POA   Would accept resuscitation.     Would accept life support and tube feeds   Social Determinants of Health   Financial Resource Strain: Not on file  Food Insecurity: Not on file  Transportation Needs: Not on file  Physical Activity: Not on  file  Stress: Not on file  Social Connections: Not on file     Family History: The patient's family history includes Cancer in her mother and another family member; Heart disease in her father; Hypertension in an other family member.  ROS:   Please see the history of present illness.     All other systems reviewed and are negative.  EKGs/Labs/Other Studies Reviewed:    The following studies were reviewed today:   Echo 06/2020 1. Left ventricular ejection fraction, by estimation, is 60 to 65%. Left  ventricular ejection fraction by PLAX is 64 %. The left ventricle has  normal function. The left ventricle has no regional wall motion  abnormalities. There  is moderate left  ventricular hypertrophy. Left ventricular diastolic parameters are  consistent with Grade I diastolic dysfunction (impaired relaxation).  2. Right ventricular systolic function was not well visualized. The right  ventricular size is normal.  3. Left atrial size was mildly dilated.  4. The mitral valve is grossly normal. Trivial mitral valve  regurgitation.  5. The aortic valve was not well visualized. There is mild calcification  of the aortic valve. Aortic valve regurgitation is not visualized.   US carotids 06/2020 IMPRESSION: 1. Right carotid artery system: Less than 50% stenosis secondary to mild focal atherosclerotic plaque formation at the carotid bulb.  2. Left carotid artery system: Patent without significant atherosclerotic plaque formation.  3.  Vertebral artery system: Patent with antegrade flow bilaterally.  Ruthann Cancer, MD  [facility]  [facility]  EKG:  EKG is  ordered today.  The ekg ordered today demonstrates NSR, PACs, IVCD, LAFB, LAD, nonspecific T wave changes.   Recent Labs: 06/10/2020: ALT 13; BUN 29; Creatinine, Ser 1.18; Hemoglobin 14.0; Platelets 274; Potassium 3.9; Sodium 139 06/11/2020: B Natriuretic Peptide 168.6   Recent Lipid Panel    Component Value Date/Time   CHOL 176 06/11/2020 0518   TRIG 58 06/11/2020 0518   HDL 63 06/11/2020 0518   CHOLHDL 2.8 06/11/2020 0518   VLDL 12 06/11/2020 0518   LDLCALC 101 (H) 06/11/2020 0518    Physical Exam:    VS:  BP (!) 148/80 (BP Location: Left Arm, Patient Position: Sitting, Cuff Size: Normal)   Pulse 90   Ht 5\' 2"  (1.575 m)   Wt 177 lb 8 oz (80.5 kg)   SpO2 99%   BMI 32.47 kg/m     Wt Readings from Last 3 Encounters:  11/26/20 177 lb 8 oz (80.5 kg)  11/02/20 176 lb (79.8 kg)  10/01/20 176 lb (79.8 kg)     GEN:  Well nourished, well developed in no acute distress HEENT: Normal NECK: No JVD; No carotid bruits LYMPHATICS: No lymphadenopathy CARDIAC: RRR, +murmur, no rubs, gallops RESPIRATORY:  Clear to auscultation without rales, wheezing or rhonchi  ABDOMEN: Soft, non-tender, non-distended MUSCULOSKELETAL:  Trace edema; No deformity  SKIN: Warm and dry NEUROLOGIC:  Alert and oriented x 3 PSYCHIATRIC:  Normal affect   ASSESSMENT:    1. Irregular heart rhythm   2. Chronic diastolic CHF (congestive heart failure) ([place])   3. Essential hypertension, benign   4. Venous insufficiency   5. Type 2 diabetes mellitus without complication, with long-term current use of insulin (HCC)    PLAN:    In order of problems listed above:  Irregular heart rhythm Referred from nephrologist for irregular rhythm. EKG shows SR, known IVCD with PACs, 90 bpm. Pateint is completely asymptomatic. Recent labs were unremarkable. TSH last year was normal. Likely PACs on exam. Heart monitor was discussed but patient refused at this time. She is willing to revisit at follow-up. She had an echo in the last year showing normal LV function.  HTN BP 148/80, at home has been high. This is being managed by PCP. Has been started on hydralazine and amlodipine this yesr. BP still high. Also on lisinopril-HCTZ.. Can consider coreg, BP and heart rate would tolerate.     Chronic HFpEF Leg swelling Venous insufficiency Working with a Vascular and vein specialist in [place]. Lower leg edema has improved with compression socks and elevation. Was on lasix as needed, but has not needed it in the last month. Trace edema on  exam, lungs clear.  She had an echo 06/2020 that showed normal LV function, G1DD, mildly dilated LA, trivial MR. I will not repeat at this time.   DM2 Most recent 6.1 per PCP  Disposition: Follow up 3 months with MD  Shared Decision Making/Informed Consent        Signed, Genna Casimir Ninfa Meeker, PA-C  11/26/2020 3:06 PM    [facility]

## 2020-11-26 ENCOUNTER — Ambulatory Visit (INDEPENDENT_AMBULATORY_CARE_PROVIDER_SITE_OTHER): Payer: Medicare Other | Admitting: Medical

## 2020-11-26 ENCOUNTER — Other Ambulatory Visit: Payer: Self-pay

## 2020-11-26 ENCOUNTER — Encounter: Payer: Self-pay | Admitting: Medical

## 2020-11-26 VITALS — BP 148/80 | HR 90 | Ht 62.0 in | Wt 177.5 lb

## 2020-11-26 DIAGNOSIS — I872 Venous insufficiency (chronic) (peripheral): Secondary | ICD-10-CM

## 2020-11-26 DIAGNOSIS — I499 Cardiac arrhythmia, unspecified: Secondary | ICD-10-CM | POA: Diagnosis not present

## 2020-11-26 DIAGNOSIS — E119 Type 2 diabetes mellitus without complications: Secondary | ICD-10-CM

## 2020-11-26 DIAGNOSIS — I10 Essential (primary) hypertension: Secondary | ICD-10-CM

## 2020-11-26 DIAGNOSIS — I5032 Chronic diastolic (congestive) heart failure: Secondary | ICD-10-CM | POA: Diagnosis not present

## 2020-11-26 DIAGNOSIS — Z794 Long term (current) use of insulin: Secondary | ICD-10-CM | POA: Diagnosis not present

## 2020-11-26 NOTE — Patient Instructions (Signed)
Medication Instructions:  No changes  *If you need a refill on your cardiac medications before your next appointment, please call your pharmacy*   Lab Work: None  If you have labs (blood work) drawn today and your tests are completely normal, you will receive your results only by: Marland Kitchen MyChart Message (if you have MyChart) OR . A paper copy in the mail If you have any lab test that is abnormal or we need to change your treatment, we will call you to review the results.   Testing/Procedures: None   Follow-Up: At [facility], you and your health needs are our priority.  As part of our continuing mission to provide you with exceptional heart care, we have created designated Provider Care Teams.  These Care Teams include your primary Cardiologist (physician) and Advanced Practice Providers (APPs -  Physician Assistants and Nurse Practitioners) who all work together to provide you with the care you need, when you need it.   Your next appointment:   3 month(s)  The format for your next appointment:   In Person  Provider:   Ida Rogue, MD

## 2020-11-29 ENCOUNTER — Other Ambulatory Visit: Payer: Self-pay

## 2020-11-29 ENCOUNTER — Encounter: Payer: Self-pay | Admitting: Internal Medicine

## 2020-11-29 ENCOUNTER — Ambulatory Visit (INDEPENDENT_AMBULATORY_CARE_PROVIDER_SITE_OTHER): Payer: Medicare Other | Admitting: Internal Medicine

## 2020-11-29 VITALS — BP 110/60 | HR 76 | Temp 97.4°F | Ht 62.0 in | Wt 175.0 lb

## 2020-11-29 DIAGNOSIS — F39 Unspecified mood [affective] disorder: Secondary | ICD-10-CM

## 2020-11-29 DIAGNOSIS — I10 Essential (primary) hypertension: Secondary | ICD-10-CM

## 2020-11-29 DIAGNOSIS — E1121 Type 2 diabetes mellitus with diabetic nephropathy: Secondary | ICD-10-CM

## 2020-11-29 DIAGNOSIS — N1832 Chronic kidney disease, stage 3b: Secondary | ICD-10-CM | POA: Diagnosis not present

## 2020-11-29 LAB — POCT GLYCOSYLATED HEMOGLOBIN (HGB A1C): Hemoglobin A1C: 6.1 % — AB (ref 4.0–5.6)

## 2020-11-29 NOTE — Assessment & Plan Note (Signed)
Actually had improvement in GFR on recent measurements Is on ACEI

## 2020-11-29 NOTE — Assessment & Plan Note (Signed)
Doing okay on the duloxetine

## 2020-11-29 NOTE — Assessment & Plan Note (Signed)
BP Readings from Last 3 Encounters:  11/29/20 110/60  11/26/20 (!) 148/80  11/02/20 128/80   Repeat on right 110/60 again Not sure if she has masked HTN at home No dizziness so will continue the hydralazine, amlodipine and lisinopril/HCTZ Recent labs okay with nephrologist

## 2020-11-29 NOTE — Assessment & Plan Note (Addendum)
Sugars generally good on the metformin Will check her A1c again  Lab Results  Component Value Date   HGBA1C 6.1 (A) 11/29/2020   Still fine No med changes needed

## 2020-11-29 NOTE — Progress Notes (Signed)
Subjective:    Patient ID: [name], female    DOB: [birth year], 75 y.o.   MRN: [id number]  HPI Here for follow up of elevated BP This visit occurred during the SARS-CoV-2 public health emergency.  Safety protocols were in place, including screening questions prior to the visit, additional usage of staff PPE, and extensive cleaning of exam room while observing appropriate contact time as indicated for disinfecting solutions.   Checking BP every day Has been 086-578'I systolic until recently Now 125/88, 146/82, 161/98, 152/121 No headaches No chest pain or SOB Amlodipine started 3/9 On hydralazine and lisinopril/HCTZ (splitting the dose now)  Sugars 92-119  Current Outpatient Medications on File Prior to Visit  Medication Sig Dispense Refill  . Accu-Chek Softclix Lancets lancets Use as instructed 100 each 3  . acetaminophen (TYLENOL) 500 MG tablet Take 500 mg by mouth every 4 (four) hours as needed for mild pain or fever.     Marland Kitchen amLODipine (NORVASC) 5 MG tablet Take 1 tablet (5 mg total) by mouth daily. 30 tablet 0  . Cholecalciferol (VITAMIN D3) 25 MCG (1000 UT) CAPS Take 1,000 Units by mouth daily.     . Chromium Picolinate 1000 MCG TABS Take 1,000 mcg by mouth daily.     . Coenzyme Q10 (CO Q 10) 100 MG CAPS Take 100 mg by mouth daily.     . Digestive Enzymes CAPS Take 2 capsules by mouth daily.     . DULoxetine (CYMBALTA) 30 MG capsule Take 1 capsule (30 mg total) by mouth daily. 90 capsule 3  . furosemide (LASIX) 20 MG tablet Take 20 mg by mouth daily as needed for edema.    . gabapentin (NEURONTIN) 300 MG capsule TAKE TWO CAPSULES BY MOUTH TWICE A DAY TAKE 3 CAPSULES AT BEDTIME 630 capsule 4  . glucose blood (ONETOUCH VERIO) test strip Use to check blood sugar once a day Dx Code E11.40 100 each 12  . hydrALAZINE (APRESOLINE) 25 MG tablet Take 1 tablet (25 mg total) by mouth 4 (four) times daily. 120 tablet 11  . lisinopril-hydrochlorothiazide (ZESTORETIC) 20-25 MG tablet  Take 1 tablet by mouth in the morning and at bedtime.    . metFORMIN (GLUCOPHAGE-XR) 500 MG 24 hr tablet TAKE 1 TABLET BY MOUTH EVERY DAY WITH BREAKFAST 90 tablet 3  . Multiple Vitamins-Minerals (MULTIVITAMIN WITH MINERALS) tablet Take 1 tablet by mouth daily.     . Omega-3 Fatty Acids (FISH OIL) 1000 MG CAPS Take 2,000 mg by mouth.    Edythe Lynn Lecithin 1200 MG CAPS Take 1,200 mg by mouth 3 (three) times daily.     . traMADol (ULTRAM) 50 MG tablet Take 2 tablets (100 mg total) by mouth 3 (three) times daily as needed. 180 tablet 0  . vitamin C (ASCORBIC ACID) 500 MG tablet Take 500 mg by mouth daily.    Marland Kitchen atorvastatin (LIPITOR) 80 MG tablet Take 1 tablet (80 mg total) by mouth daily. 30 tablet 2  . donepezil (ARICEPT) 5 MG tablet Take 5 mg by mouth at bedtime.      No current facility-administered medications on file prior to visit.    No Known Allergies  Past Medical History:  Diagnosis Date  . Arthritis   . Confusion   . Diabetes mellitus without complication ([place])    type 2  . Hepatitis    pt. denies  . Hypertension   . Lumbar stenosis   . Memory loss   . Nocturia   .  Numbness and tingling    legs and feet  . Wears glasses     Past Surgical History:  Procedure Laterality Date  . [place]  . COLONOSCOPY    . HEMORRHOID SURGERY  2015  . JOINT REPLACEMENT     01-08-18 Dr. Kathrynn Speed  . TOTAL HIP ARTHROPLASTY Left 01/08/2018   Procedure: LEFT TOTAL HIP ARTHROPLASTY ANTERIOR APPROACH;  Surgeon: Mcarthur Rossetti, MD;  Location: [facility];  Service: Orthopedics;  Laterality: Left;  . TRANSFORAMINAL LUMBAR INTERBODY FUSION (TLIF) WITH PEDICLE SCREW FIXATION 2 LEVEL N/A 11/07/2015   Procedure: Lumbar three-four Lumbar four-five  transforaminal lumbar interbody fusion with interbody prosthesis posterior lateral arthrodesis and posterior segmental instrumentation;  Surgeon: Newman Pies, MD;  Location: [place] [facility];  Service: Neurosurgery;  Laterality: N/A;     Family History  Problem Relation Age of Onset  . Hypertension Other   . Cancer Other        ovarian  . Cancer Mother   . Heart disease Father     Social History   Socioeconomic History  . Marital status: Divorced    Spouse name: Not on file  . Number of children: 4  . Years of education: Not on file  . Highest education level: Associate degree: occupational, Hotel manager, or vocational program  Occupational History  . Not on file  Tobacco Use  . Smoking status: Never Smoker  . Smokeless tobacco: Never Used  Vaping Use  . Vaping Use: Never used  Substance and Sexual Activity  . Alcohol use: No    Comment: rarely  . Drug use: No  . Sexual activity: Not Currently  Other Topics Concern  . Not on file  Social History Narrative   Has a living will.   Son is health care POA   Would accept resuscitation.     Would accept life support and tube feeds   Social Determinants of Health   Financial Resource Strain: Not on file  Food Insecurity: Not on file  Transportation Needs: Not on file  Physical Activity: Not on file  Stress: Not on file  Social Connections: Not on file  Intimate Partner Violence: Not on file   Review of Systems Sleeps okay but up for nocturia Appetite is fairly stable Does have colonoscopy scheduled for June    Objective:   Physical Exam Constitutional:      Appearance: Normal appearance.  Cardiovascular:     Rate and Rhythm: Normal rate and regular rhythm.     Heart sounds: No murmur heard. No gallop.   Pulmonary:     Effort: Pulmonary effort is normal.     Breath sounds: Normal breath sounds. No wheezing or rales.  Musculoskeletal:     Cervical back: Neck supple.     Right lower leg: No edema.     Left lower leg: No edema.  Lymphadenopathy:     Cervical: No cervical adenopathy.  Neurological:     Mental Status: She is alert.  Psychiatric:        Mood and Affect: Mood normal.            Assessment & Plan:

## 2020-12-06 ENCOUNTER — Other Ambulatory Visit: Payer: Self-pay | Admitting: Internal Medicine

## 2020-12-17 ENCOUNTER — Encounter: Payer: Self-pay | Admitting: Family Medicine

## 2020-12-24 ENCOUNTER — Other Ambulatory Visit: Payer: Self-pay | Admitting: Internal Medicine

## 2020-12-24 NOTE — Telephone Encounter (Signed)
Last filled 11-13-20 #180 Last OV 11-29-20 Next OV 06-27-21 [facility]

## 2021-01-22 ENCOUNTER — Other Ambulatory Visit: Payer: Self-pay | Admitting: Family

## 2021-01-25 ENCOUNTER — Other Ambulatory Visit: Payer: Self-pay | Admitting: Internal Medicine

## 2021-01-25 MED ORDER — TRAMADOL HCL 50 MG PO TABS: 100.0000 mg | ORAL_TABLET | Freq: Three times a day (TID) | ORAL | 0 refills | Status: DC | PRN

## 2021-01-25 NOTE — Telephone Encounter (Signed)
Last filled 12-24-20 #180 Last OV 11-29-20 Next OV 06-27-21 [facility]

## 2021-01-25 NOTE — Telephone Encounter (Signed)
  LAST APPOINTMENT DATE: 01/22/2021   NEXT APPOINTMENT DATE:@10 /13/2022  MEDICATION: tramadol  PHARMACY:  Let patient know to contact pharmacy at the end of the day to make sure medication is ready.  Please notify patient to allow 48-72 hours to process  Encourage patient to contact the pharmacy for refills or they can request refills through [facility]:   LAST REFILL:  QTY:  REFILL DATE:    OTHER COMMENTS:    Okay for refill?  Please advise

## 2021-01-28 DIAGNOSIS — E672 Megavitamin-B6 syndrome: Secondary | ICD-10-CM | POA: Diagnosis not present

## 2021-01-28 DIAGNOSIS — Z8673 Personal history of transient ischemic attack (TIA), and cerebral infarction without residual deficits: Secondary | ICD-10-CM | POA: Diagnosis not present

## 2021-01-28 DIAGNOSIS — Z79899 Other long term (current) drug therapy: Secondary | ICD-10-CM | POA: Diagnosis not present

## 2021-01-28 DIAGNOSIS — E559 Vitamin D deficiency, unspecified: Secondary | ICD-10-CM | POA: Diagnosis not present

## 2021-01-28 DIAGNOSIS — R200 Anesthesia of skin: Secondary | ICD-10-CM | POA: Diagnosis not present

## 2021-01-28 DIAGNOSIS — G252 Other specified forms of tremor: Secondary | ICD-10-CM | POA: Diagnosis not present

## 2021-01-28 DIAGNOSIS — R7309 Other abnormal glucose: Secondary | ICD-10-CM | POA: Diagnosis not present

## 2021-02-05 DIAGNOSIS — E1140 Type 2 diabetes mellitus with diabetic neuropathy, unspecified: Secondary | ICD-10-CM | POA: Diagnosis not present

## 2021-02-05 DIAGNOSIS — I872 Venous insufficiency (chronic) (peripheral): Secondary | ICD-10-CM | POA: Diagnosis not present

## 2021-02-05 DIAGNOSIS — I10 Essential (primary) hypertension: Secondary | ICD-10-CM | POA: Diagnosis not present

## 2021-02-05 DIAGNOSIS — I452 Bifascicular block: Secondary | ICD-10-CM | POA: Diagnosis not present

## 2021-02-05 DIAGNOSIS — R9431 Abnormal electrocardiogram [ECG] [EKG]: Secondary | ICD-10-CM | POA: Diagnosis not present

## 2021-02-13 DIAGNOSIS — H905 Unspecified sensorineural hearing loss: Secondary | ICD-10-CM | POA: Diagnosis not present

## 2021-02-23 ENCOUNTER — Other Ambulatory Visit: Payer: Self-pay | Admitting: Internal Medicine

## 2021-02-25 NOTE — Telephone Encounter (Signed)
Last filled 01-25-21 #180 Last OV 11-29-20 Next OV 06-27-21 [facility]

## 2021-02-26 ENCOUNTER — Ambulatory Visit: Payer: Medicare Other | Admitting: Cardiovascular Disease

## 2021-02-28 ENCOUNTER — Telehealth: Payer: Self-pay

## 2021-02-28 NOTE — Telephone Encounter (Signed)
Feels sleepy. But not dizzy when sitting down. Has not been this low before. She is still taking the hydralazine 4 times daily, lisinopril-hctz twice daily, and amlodipine daily at 12pm. This is the first time she has gotten readings like this. Had a 190/103 on 6-20.

## 2021-02-28 NOTE — Telephone Encounter (Signed)
Spoke to pt. She was asking about the arm pain. She did not mention it when I called her. Starts at the elbow and up her arms or down to her hands. Started about 10 days ago intermittent "3 minute attack". I told her she may need to be evaluated tonight if she is having increased dizziness and arm pain. She said she will but does not think she will need to go anywhere tonight and will wait to see what Dr Silvio Pate says tomorrow.

## 2021-02-28 NOTE — Telephone Encounter (Signed)
New message    Pt c/o BP issue:   1. What are your last 5 BP readings?   Left arm  @ 2:30 pm 97/69 pulse 97   Left arm @ 3:18 pm 97/64 pulse  88   2. Are you having any other symptoms (ex. Dizziness, headache, blurred vision, passed out)? No   3. What is your BP issue? Pain in arm / discuss with nurse

## 2021-02-28 NOTE — Telephone Encounter (Signed)
Left message to call office to see when she takes her BP medications. Was this the 1st time they were lower like this. Said she did not have any dizziness per Gisela's note.

## 2021-03-01 NOTE — Telephone Encounter (Signed)
I cannot tell what is going on in her arm with that description. If the symptoms persist, she will need to be seen for this (but I have no slots till next week). If she feels she needs it, see if someone else can fit her in

## 2021-03-01 NOTE — Telephone Encounter (Signed)
Spoke to pt. She does not want to make an appt. Said she will if it gets worse. Will wait until routine follow-up.

## 2021-03-22 NOTE — Telephone Encounter (Signed)
FYI

## 2021-03-29 ENCOUNTER — Other Ambulatory Visit: Payer: Self-pay | Admitting: Internal Medicine

## 2021-03-29 ENCOUNTER — Encounter: Payer: Self-pay | Admitting: Family Medicine

## 2021-03-29 ENCOUNTER — Other Ambulatory Visit: Payer: Self-pay

## 2021-03-29 ENCOUNTER — Ambulatory Visit: Payer: Medicare Other | Admitting: Internal Medicine

## 2021-03-29 ENCOUNTER — Ambulatory Visit (INDEPENDENT_AMBULATORY_CARE_PROVIDER_SITE_OTHER): Payer: Medicare Other | Admitting: Family Medicine

## 2021-03-29 DIAGNOSIS — L304 Erythema intertrigo: Secondary | ICD-10-CM

## 2021-03-29 DIAGNOSIS — B372 Candidiasis of skin and nail: Secondary | ICD-10-CM | POA: Insufficient documentation

## 2021-03-29 MED ORDER — FLUCONAZOLE 100 MG PO TABS: ORAL_TABLET | ORAL | 0 refills | Status: DC

## 2021-03-29 MED ORDER — NYSTATIN 100000 UNIT/GM EX POWD: 1.0000 "application " | Freq: Three times a day (TID) | CUTANEOUS | 0 refills | Status: DC

## 2021-03-29 NOTE — Telephone Encounter (Signed)
Pt was seen by Dr. Glori Bickers for an acute appt and wanted her to fill med. I did advise pt PCP has to fill med and I see a pending refill request.  Last filled on 02/25/21 #180 tabs with 0 refills Pt has a CPE scheduled on 06/27/21 with PCP

## 2021-03-29 NOTE — Progress Notes (Signed)
Subjective:    Patient ID: [name], female    DOB: [birth year], 75 y.o.   MRN: [id number]  This visit occurred during the SARS-CoV-2 public health emergency.  Safety protocols were in place, including screening questions prior to the visit, additional usage of staff PPE, and extensive cleaning of exam room while observing appropriate contact time as indicated for disinfecting solutions.   HPI 75 yo pt of Dr Silvio Pate presents with rash on stomach  Wt Readings from Last 3 Encounters:  11/29/20 175 lb (79.4 kg)  11/26/20 177 lb 8 oz (80.5 kg)  11/02/20 176 lb (79.8 kg)   32.01 kg/m  Rash started 2 wk ago  In skin folds  Used antifungal ointment  Not itchy anymore it is still there   No fever   She has never had anything like this before   Has some moles under her breast for years   Rash has not spread to new areas No tick bites   Patient Active Problem List   Diagnosis Date Noted   Intertrigo 03/29/2021   History of stroke 07/04/2020   Chronic diastolic CHF (congestive heart failure) ([place]) 06/10/2020   Stage 3b chronic kidney disease ([place]) 06/10/2020   Type II diabetes mellitus with renal manifestations ([place]) 06/10/2020   Abnormal MRI    Facial laceration 02/28/2020   Contusion, back 02/28/2020   Dizziness 02/28/2020   Shoulder tendonitis, left 07/25/2019   Chronic narcotic dependence ([place]) 07/04/2019   Osteoarthritis of right knee 11/16/2018   Urge incontinence 10/07/2018   Mycotic toenails 10/07/2018   Venous insufficiency 10/07/2018   Unilateral primary osteoarthritis, left hip 01/08/2018   Status post total replacement of left hip 01/08/2018   Left hip pain 12/09/2017   Pain syndrome, chronic 03/13/2017   Facet arthropathy, lumbar 03/13/2017   Advance directive discussed with patient 01/15/2016   Spinal stenosis of lumbar region with neurogenic claudication 11/07/2015   Other intervertebral disc degeneration, lumbar region 06/01/2015   Chronic back pain  03/29/2015   Preventative health care 01/10/2015   Osteoarthritis, hip, bilateral 11/28/2014   Peripheral neuropathy 11/28/2014   Type 2 diabetes, controlled, with neuropathy ([place]) 01/20/2014   Episodic mood disorder ([place]) 01/20/2014   MCI (mild cognitive impairment) 12/26/2013   IBS (irritable bowel syndrome) 12/27/2012   Essential hypertension, benign 08/06/2010   Past Medical History:  Diagnosis Date   Arthritis    Confusion    Diabetes mellitus without complication ([place])    type 2   Hepatitis    pt. denies   Hypertension    Lumbar stenosis    Memory loss    Nocturia    Numbness and tingling    legs and feet   Wears glasses    Past Surgical History:  Procedure Laterality Date   CESAREAN SECTION  1978   COLONOSCOPY     HEMORRHOID SURGERY  2015   JOINT REPLACEMENT     01-08-18 Dr. Kathrynn Speed   TOTAL HIP ARTHROPLASTY Left 01/08/2018   Procedure: LEFT TOTAL HIP ARTHROPLASTY ANTERIOR APPROACH;  Surgeon: Mcarthur Rossetti, MD;  Location: [facility];  Service: Orthopedics;  Laterality: Left;   TRANSFORAMINAL LUMBAR INTERBODY FUSION (TLIF) WITH PEDICLE SCREW FIXATION 2 LEVEL N/A 11/07/2015   Procedure: Lumbar three-four Lumbar four-five  transforaminal lumbar interbody fusion with interbody prosthesis posterior lateral arthrodesis and posterior segmental instrumentation;  Surgeon: Newman Pies, MD;  Location: [place] [facility];  Service: Neurosurgery;  Laterality: N/A;   Social History   Tobacco Use  Smoking status: Never   Smokeless tobacco: Never  Vaping Use   Vaping Use: Never used  Substance Use Topics   Alcohol use: No    Comment: rarely   Drug use: No   Family History  Problem Relation Age of Onset   Hypertension Other    Cancer Other        ovarian   Cancer Mother    Heart disease Father    No Known Allergies Current Outpatient Medications on File Prior to Visit  Medication Sig Dispense Refill   Accu-Chek Softclix Lancets lancets Use as instructed 100 each 3    acetaminophen (TYLENOL) 500 MG tablet Take 500 mg by mouth every 4 (four) hours as needed for mild pain or fever.      amLODipine (NORVASC) 5 MG tablet TAKE 1 TABLET (5 MG TOTAL) BY MOUTH DAILY. 90 tablet 3   Cholecalciferol (VITAMIN D3) 25 MCG (1000 UT) CAPS Take 1,000 Units by mouth daily.      Chromium Picolinate 1000 MCG TABS Take 1,000 mcg by mouth daily.      Coenzyme Q10 (CO Q 10) 100 MG CAPS Take 100 mg by mouth daily.      Digestive Enzymes CAPS Take 2 capsules by mouth daily.      DULoxetine (CYMBALTA) 30 MG capsule Take 1 capsule (30 mg total) by mouth daily. 90 capsule 3   furosemide (LASIX) 20 MG tablet Take 20 mg by mouth daily as needed for edema.     gabapentin (NEURONTIN) 300 MG capsule TAKE TWO CAPSULES BY MOUTH TWICE A DAY TAKE 3 CAPSULES AT BEDTIME 630 capsule 4   glucose blood (ONETOUCH VERIO) test strip Use to check blood sugar once a day Dx Code E11.40 100 each 12   hydrALAZINE (APRESOLINE) 25 MG tablet Take 1 tablet (25 mg total) by mouth 4 (four) times daily. 120 tablet 11   lisinopril-hydrochlorothiazide (ZESTORETIC) 20-25 MG tablet Take 1 tablet by mouth in the morning and at bedtime.     metFORMIN (GLUCOPHAGE-XR) 500 MG 24 hr tablet TAKE 1 TABLET BY MOUTH EVERY DAY WITH BREAKFAST 90 tablet 3   Multiple Vitamins-Minerals (MULTIVITAMIN WITH MINERALS) tablet Take 1 tablet by mouth daily.      Omega-3 Fatty Acids (FISH OIL) 1000 MG CAPS Take 2,000 mg by mouth.     Soya Lecithin 1200 MG CAPS Take 1,200 mg by mouth 3 (three) times daily.      traMADol (ULTRAM) 50 MG tablet TAKE 2 TABLETS (100 MG TOTAL) BY MOUTH 3 (THREE) TIMES DAILY AS NEEDED. 180 tablet 0   vitamin C (ASCORBIC ACID) 500 MG tablet Take 500 mg by mouth daily.     atorvastatin (LIPITOR) 80 MG tablet Take 1 tablet (80 mg total) by mouth daily. 30 tablet 2   donepezil (ARICEPT) 5 MG tablet Take 5 mg by mouth at bedtime.      No current facility-administered medications on file prior to visit.    Review of  Systems  Constitutional:  Positive for fatigue. Negative for activity change, appetite change, fever and unexpected weight change.  HENT:  Negative for congestion, rhinorrhea, sore throat and trouble swallowing.   Eyes:  Negative for pain, redness, itching and visual disturbance.  Respiratory:  Negative for cough, chest tightness, shortness of breath and wheezing.   Cardiovascular:  Negative for chest pain and palpitations.  Gastrointestinal:  Negative for abdominal pain, blood in stool, constipation, diarrhea and nausea.  Endocrine: Negative for cold intolerance, heat intolerance, polydipsia and  polyuria.  Genitourinary:  Negative for difficulty urinating, dysuria, frequency and urgency.  Musculoskeletal:  Positive for arthralgias. Negative for joint swelling and myalgias.  Skin:  Positive for rash. Negative for pallor.  Neurological:  Negative for dizziness, tremors, weakness, numbness and headaches.  Hematological:  Negative for adenopathy. Does not bruise/bleed easily.  Psychiatric/Behavioral:  Negative for decreased concentration and dysphoric mood. The patient is not nervous/anxious.       Objective:   Physical Exam Constitutional:      General: She is not in acute distress.    Appearance: Normal appearance. She is obese. She is not ill-appearing.  HENT:     Head: Normocephalic and atraumatic.  Eyes:     Conjunctiva/sclera: Conjunctivae normal.     Pupils: Pupils are equal, round, and reactive to light.  Cardiovascular:     Rate and Rhythm: Normal rate and regular rhythm.     Heart sounds: Normal heart sounds.  Pulmonary:     Effort: Pulmonary effort is normal. No respiratory distress.  Musculoskeletal:     Cervical back: Neck supple. No tenderness.     Comments: Joint changes consistent with OA in hands  Lymphadenopathy:     Cervical: No cervical adenopathy.  Skin:    Findings: Bruising, erythema and rash present.     Comments: Erythematous rash under pannus /in skin  folds  Some satellite lesions  Incidental tags as well  No skin breakdown , but there are several healing excoriations No swelling   Some superficial bruises on forearms     Neurological:     Mental Status: She is alert.     Sensory: No sensory deficit.          Assessment & Plan:   Problem List Items Addressed This Visit       Musculoskeletal and Integument   Intertrigo    In skin fold under pannus. Some imp in itching after use of otc anti fungal ointment (pt could not tell me what it was) and some skin tags No evidence of bact infection  Will try oral fluconazole 100 mg every 3 d for 3 doses and update  Also nystatin powder  Update if not starting to improve in a week or if worsening   Enc pt to keep area clean and dry as much as possible

## 2021-03-29 NOTE — Assessment & Plan Note (Signed)
In skin fold under pannus. Some imp in itching after use of otc anti fungal ointment (pt could not tell me what it was) and some skin tags No evidence of bact infection  Will try oral fluconazole 100 mg every 3 d for 3 doses and update  Also nystatin powder  Update if not starting to improve in a week or if worsening   Enc pt to keep area clean and dry as much as possible

## 2021-03-29 NOTE — Patient Instructions (Signed)
Try and keep area gently clean and dry  Stay as cool as you can  Take the generic diflucan pill 100 mg every 3 days for three doses and then let us know how you do with it Use the nystatin powder up to three times daily (instead of the ointment) to help the rash and also keep the area more dry  Update if not starting to improve in a week or if worsening

## 2021-04-03 DIAGNOSIS — I129 Hypertensive chronic kidney disease with stage 1 through stage 4 chronic kidney disease, or unspecified chronic kidney disease: Secondary | ICD-10-CM | POA: Diagnosis not present

## 2021-04-03 DIAGNOSIS — R809 Proteinuria, unspecified: Secondary | ICD-10-CM | POA: Diagnosis not present

## 2021-04-03 DIAGNOSIS — N1831 Chronic kidney disease, stage 3a: Secondary | ICD-10-CM | POA: Diagnosis not present

## 2021-04-03 DIAGNOSIS — E1122 Type 2 diabetes mellitus with diabetic chronic kidney disease: Secondary | ICD-10-CM | POA: Diagnosis not present

## 2021-04-03 NOTE — Telephone Encounter (Signed)
Dr. Glori Bickers is out of the office and not checking mychart. Will route to PCP's inbox for covering provider to address

## 2021-04-04 ENCOUNTER — Telehealth: Payer: Self-pay | Admitting: Internal Medicine

## 2021-04-04 NOTE — Chronic Care Management (AMB) (Signed)
  Chronic Care Management   Outreach Note  04/04/2021 Name: KAMORI FEESER MRN: [id number] DOB: April 18, 1946  Referred by: Venia Carbon, MD Reason for referral : No chief complaint on file.   An unsuccessful telephone outreach was attempted today. The patient was referred to the pharmacist for assistance with care management and care coordination.   Follow Up Plan:   Tatjana Dellinger Upstream Scheduler

## 2021-04-05 ENCOUNTER — Telehealth: Payer: Self-pay | Admitting: Internal Medicine

## 2021-04-05 NOTE — Chronic Care Management (AMB) (Signed)
  Chronic Care Management   Note  04/05/2021 Name: [name] MRN: [id number] DOB: Feb 26, 1946  [name] is a 75 y.o. year old female who is a primary care patient of Venia Carbon, MD. I reached out to [name] by phone today in response to a referral sent by [name]'s PCP, Venia Carbon, MD.   [name] was given information about Chronic Care Management services today including:  CCM service includes personalized support from designated clinical staff supervised by her physician, including individualized plan of care and coordination with other care providers 24/7 contact phone numbers for assistance for urgent and routine care needs. Service will only be billed when office clinical staff spend 20 minutes or more in a month to coordinate care. Only one practitioner may furnish and bill the service in a calendar month. The patient may stop CCM services at any time (effective at the end of the month) by phone call to the office staff.   Patient did not agree to enrollment in care management services and does not wish to consider at this time.  Follow up plan: Tatjana Secretary/administrator

## 2021-04-13 MED ORDER — FLUCONAZOLE 150 MG PO TABS: 150.0000 mg | ORAL_TABLET | ORAL | 1 refills | Status: DC

## 2021-04-26 ENCOUNTER — Other Ambulatory Visit: Payer: Self-pay | Admitting: Internal Medicine

## 2021-04-26 ENCOUNTER — Telehealth: Payer: Medicare Other

## 2021-04-26 NOTE — Telephone Encounter (Signed)
Last filled 03-29-21 #180 Last OV Acute 03-29-21 Next OV 06-27-21 [facility]

## 2021-05-10 ENCOUNTER — Ambulatory Visit (INDEPENDENT_AMBULATORY_CARE_PROVIDER_SITE_OTHER): Payer: Medicare Other | Admitting: Internal Medicine

## 2021-05-10 ENCOUNTER — Encounter: Payer: Self-pay | Admitting: Internal Medicine

## 2021-05-10 ENCOUNTER — Other Ambulatory Visit: Payer: Self-pay | Admitting: Internal Medicine

## 2021-05-10 ENCOUNTER — Other Ambulatory Visit: Payer: Self-pay

## 2021-05-10 DIAGNOSIS — B372 Candidiasis of skin and nail: Secondary | ICD-10-CM | POA: Diagnosis not present

## 2021-05-10 MED ORDER — FLUCONAZOLE 150 MG PO TABS: 150.0000 mg | ORAL_TABLET | ORAL | 3 refills | Status: DC

## 2021-05-10 NOTE — Assessment & Plan Note (Signed)
Persistent intertrigo due to fairly large abdominal pannus Discussed using powder to keep dry Will maintain on weekly fluconazole '150mg'$ 

## 2021-05-10 NOTE — Progress Notes (Signed)
Subjective:    Patient ID: [name], female    DOB: 23-Sep-1945, 75 y.o.   MRN: [id number]  HPI Here due to a persistent rash This visit occurred during the SARS-CoV-2 public health emergency.  Safety protocols were in place, including screening questions prior to the visit, additional usage of staff PPE, and extensive cleaning of exam room while observing appropriate contact time as indicated for disinfecting solutions.   Has had rash in groin area (inguinal) that goes back 3 months or so Has used cream with incomplete results 1 week of fluconazole '100mg'$  wasn't clearly helpful Then she took weekly '150mg'$ --worked well but recurred when it stopped  Checking sugars regularly All under 120 for the most part  Current Outpatient Medications on File Prior to Visit  Medication Sig Dispense Refill   Accu-Chek Softclix Lancets lancets Use as instructed 100 each 3   acetaminophen (TYLENOL) 500 MG tablet Take 500 mg by mouth every 4 (four) hours as needed for mild pain or fever.      amLODipine (NORVASC) 5 MG tablet TAKE 1 TABLET (5 MG TOTAL) BY MOUTH DAILY. 90 tablet 3   Cholecalciferol (VITAMIN D3) 25 MCG (1000 UT) CAPS Take 1,000 Units by mouth daily.      Chromium Picolinate 1000 MCG TABS Take 1,000 mcg by mouth daily.      Coenzyme Q10 (CO Q 10) 100 MG CAPS Take 100 mg by mouth daily.      Digestive Enzymes CAPS Take 2 capsules by mouth daily.      DULoxetine (CYMBALTA) 30 MG capsule Take 1 capsule (30 mg total) by mouth daily. 90 capsule 3   gabapentin (NEURONTIN) 300 MG capsule TAKE TWO CAPSULES BY MOUTH TWICE A DAY TAKE 3 CAPSULES AT BEDTIME 630 capsule 4   glucose blood (ONETOUCH VERIO) test strip Use to check blood sugar once a day Dx Code E11.40 100 each 12   hydrALAZINE (APRESOLINE) 25 MG tablet Take 1 tablet (25 mg total) by mouth 4 (four) times daily. 120 tablet 11   lisinopril-hydrochlorothiazide (ZESTORETIC) 20-25 MG tablet Take 1 tablet by mouth in the morning and at  bedtime.     metFORMIN (GLUCOPHAGE-XR) 500 MG 24 hr tablet TAKE 1 TABLET BY MOUTH EVERY DAY WITH BREAKFAST 90 tablet 3   Multiple Vitamins-Minerals (MULTIVITAMIN WITH MINERALS) tablet Take 1 tablet by mouth daily.      Omega-3 Fatty Acids (FISH OIL) 1000 MG CAPS Take 2,000 mg by mouth.     Soya Lecithin 1200 MG CAPS Take 1,200 mg by mouth 3 (three) times daily.      traMADol (ULTRAM) 50 MG tablet TAKE 2 TABLETS (100 MG TOTAL) BY MOUTH 3 (THREE) TIMES DAILY AS NEEDED. 180 tablet 0   vitamin C (ASCORBIC ACID) 500 MG tablet Take 500 mg by mouth daily.     atorvastatin (LIPITOR) 80 MG tablet Take 1 tablet (80 mg total) by mouth daily. 30 tablet 2   donepezil (ARICEPT) 5 MG tablet Take 5 mg by mouth at bedtime.      No current facility-administered medications on file prior to visit.    No Known Allergies  Past Medical History:  Diagnosis Date   Arthritis    Confusion    Diabetes mellitus without complication ([place])    type 2   Hepatitis    pt. denies   Hypertension    Lumbar stenosis    Memory loss    Nocturia    Numbness and tingling  legs and feet   Wears glasses     Past Surgical History:  Procedure Laterality Date   CESAREAN SECTION  1978   COLONOSCOPY     HEMORRHOID SURGERY  2015   JOINT REPLACEMENT     01-08-18 Dr. Kathrynn Speed   TOTAL HIP ARTHROPLASTY Left 01/08/2018   Procedure: LEFT TOTAL HIP ARTHROPLASTY ANTERIOR APPROACH;  Surgeon: Mcarthur Rossetti, MD;  Location: [facility];  Service: Orthopedics;  Laterality: Left;   TRANSFORAMINAL LUMBAR INTERBODY FUSION (TLIF) WITH PEDICLE SCREW FIXATION 2 LEVEL N/A 11/07/2015   Procedure: Lumbar three-four Lumbar four-five  transforaminal lumbar interbody fusion with interbody prosthesis posterior lateral arthrodesis and posterior segmental instrumentation;  Surgeon: Newman Pies, MD;  Location: [place] [facility];  Service: Neurosurgery;  Laterality: N/A;    Family History  Problem Relation Age of Onset   Hypertension Other     Cancer Other        ovarian   Cancer Mother    Heart disease Father     Social History   Socioeconomic History   Marital status: Divorced    Spouse name: Not on file   Number of children: 4   Years of education: Not on file   Highest education level: Associate degree: occupational, Hotel manager, or vocational program  Occupational History   Not on file  Tobacco Use   Smoking status: Never   Smokeless tobacco: Never  Vaping Use   Vaping Use: Never used  Substance and Sexual Activity   Alcohol use: No    Comment: rarely   Drug use: No   Sexual activity: Not Currently  Other Topics Concern   Not on file  Social History Narrative   Has a living will.   Son is health care POA   Would accept resuscitation.     Would accept life support and tube feeds   Social Determinants of Health   Financial Resource Strain: Not on file  Food Insecurity: Not on file  Transportation Needs: Not on file  Physical Activity: Not on file  Stress: Not on file  Social Connections: Not on file  Intimate Partner Violence: Not on file   Review of Systems Weight is stable Continues on same pain regimen      Objective:   Physical Exam Constitutional:      Appearance: Normal appearance.  Skin:    Comments: Redness with slight ulceration under abdominal pannus and along left inguinal area  Neurological:     Mental Status: She is alert.           Assessment & Plan:

## 2021-05-15 MED ORDER — ONETOUCH VERIO W/DEVICE KIT: 1.0000 | PACK | Freq: Once | 0 refills | Status: AC

## 2021-05-27 MED ORDER — TRAMADOL HCL 50 MG PO TABS: 100.0000 mg | ORAL_TABLET | Freq: Three times a day (TID) | ORAL | 0 refills | Status: DC | PRN

## 2021-05-27 NOTE — Telephone Encounter (Signed)
Last filled 04-26-21 #180 Last OV 05-10-21 Next OV 06-27-21 [facility]

## 2021-06-19 DIAGNOSIS — K529 Noninfective gastroenteritis and colitis, unspecified: Secondary | ICD-10-CM | POA: Diagnosis not present

## 2021-06-19 DIAGNOSIS — D123 Benign neoplasm of transverse colon: Secondary | ICD-10-CM | POA: Diagnosis not present

## 2021-06-19 DIAGNOSIS — Z1211 Encounter for screening for malignant neoplasm of colon: Secondary | ICD-10-CM | POA: Diagnosis not present

## 2021-06-19 DIAGNOSIS — K5730 Diverticulosis of large intestine without perforation or abscess without bleeding: Secondary | ICD-10-CM | POA: Diagnosis not present

## 2021-06-19 DIAGNOSIS — D120 Benign neoplasm of cecum: Secondary | ICD-10-CM | POA: Diagnosis not present

## 2021-06-19 NOTE — Telephone Encounter (Signed)
traMADol (ULTRAM) 50 MG tablet [Pharmacy Med Name: TRAMADOL HCL 50 MG TABLET] 180 tablet 0 06/19/2021    Request refused: Request already responded to by other means (e.g. phone or fax)   Sig - Route: TAKE 2 TABLETS (100 MG TOTAL) BY MOUTH 3 (THREE) TIMES DAILY AS NEEDED. - Oral   Notes to Pharmacy: Not to exceed 4 additional fills before 06/22/2021.

## 2021-06-20 ENCOUNTER — Other Ambulatory Visit: Payer: Medicare Other

## 2021-06-20 ENCOUNTER — Telehealth: Payer: Self-pay | Admitting: Internal Medicine

## 2021-06-20 NOTE — Telephone Encounter (Signed)
Pt called in stating that she is confused on certain medications that she is taking

## 2021-06-20 NOTE — Telephone Encounter (Signed)
Left message on VM to call back

## 2021-06-21 NOTE — Telephone Encounter (Signed)
Pt sent a MyChart message about medications. I will close this note.

## 2021-06-24 ENCOUNTER — Other Ambulatory Visit: Payer: Self-pay | Admitting: Internal Medicine

## 2021-06-24 MED ORDER — DULOXETINE HCL 30 MG PO CPEP: 30.0000 mg | ORAL_CAPSULE | Freq: Every day | ORAL | 3 refills | Status: DC

## 2021-06-24 NOTE — Telephone Encounter (Signed)
I have sent in a refill for the duloxetine. Meloxicam is not on her med list. I wanted to let Dr Silvio Pate respond about not being on meloxicam.

## 2021-06-25 DIAGNOSIS — D120 Benign neoplasm of cecum: Secondary | ICD-10-CM | POA: Diagnosis not present

## 2021-06-25 DIAGNOSIS — K529 Noninfective gastroenteritis and colitis, unspecified: Secondary | ICD-10-CM | POA: Diagnosis not present

## 2021-06-25 DIAGNOSIS — D123 Benign neoplasm of transverse colon: Secondary | ICD-10-CM | POA: Diagnosis not present

## 2021-06-27 ENCOUNTER — Other Ambulatory Visit: Payer: Self-pay

## 2021-06-27 ENCOUNTER — Encounter: Payer: Self-pay | Admitting: Internal Medicine

## 2021-06-27 ENCOUNTER — Ambulatory Visit (INDEPENDENT_AMBULATORY_CARE_PROVIDER_SITE_OTHER): Payer: Medicare Other | Admitting: Internal Medicine

## 2021-06-27 VITALS — BP 118/68 | HR 59 | Temp 97.5°F | Ht 59.0 in | Wt 170.0 lb

## 2021-06-27 DIAGNOSIS — G894 Chronic pain syndrome: Secondary | ICD-10-CM | POA: Diagnosis not present

## 2021-06-27 DIAGNOSIS — F1120 Opioid dependence, uncomplicated: Secondary | ICD-10-CM

## 2021-06-27 DIAGNOSIS — E1140 Type 2 diabetes mellitus with diabetic neuropathy, unspecified: Secondary | ICD-10-CM | POA: Diagnosis not present

## 2021-06-27 DIAGNOSIS — F321 Major depressive disorder, single episode, moderate: Secondary | ICD-10-CM | POA: Diagnosis not present

## 2021-06-27 DIAGNOSIS — Z0000 Encounter for general adult medical examination without abnormal findings: Secondary | ICD-10-CM | POA: Diagnosis not present

## 2021-06-27 DIAGNOSIS — I5032 Chronic diastolic (congestive) heart failure: Secondary | ICD-10-CM | POA: Diagnosis not present

## 2021-06-27 LAB — HM DIABETES FOOT EXAM

## 2021-06-27 MED ORDER — DULOXETINE HCL 60 MG PO CPEP: 60.0000 mg | ORAL_CAPSULE | Freq: Every day | ORAL | 11 refills | Status: DC

## 2021-06-27 NOTE — Assessment & Plan Note (Signed)
I have personally reviewed the Medicare Annual Wellness questionnaire and have noted 1. The patient's medical and social history 2. Their use of alcohol, tobacco or illicit drugs 3. Their current medications and supplements 4. The patient's functional ability including ADL's, fall risks, home safety risks and hearing or visual             impairment. 5. Diet and physical activities 6. Evidence for depression or mood disorders  The patients weight, height, BMI and visual acuity have been recorded in the chart I have made referrals, counseling and provided education to the patient based review of the above and I have provided the pt with a written personalized care plan for preventive services.  I have provided you with a copy of your personalized plan for preventive services. Please take the time to review along with your updated medication list.  Will give flu vaccine Bivalent COVID at the pharmacy Just had colon--refuses to do again despite the polyps Prefers no mammogram Discussed resistance training as possible

## 2021-06-27 NOTE — Assessment & Plan Note (Signed)
Seems to be compensated Support hose Hasn't needed the furosemide regularly

## 2021-06-27 NOTE — Assessment & Plan Note (Signed)
Will continue the tramadol 100 mg tid

## 2021-06-27 NOTE — Assessment & Plan Note (Signed)
PDMP reviewed No concerns 

## 2021-06-27 NOTE — Progress Notes (Signed)
Hearing Screening - Comments:: Has hearing aids. Not wearing them today. Vision Screening - Comments:: April 2022

## 2021-06-27 NOTE — Progress Notes (Signed)
Subjective:    Patient ID: [name], female    DOB: 01/07/1946, 75 y.o.   MRN: [id number]  HPI Here for Medicare wellness visit and follow up of chronic health conditions This visit occurred during the SARS-CoV-2 public health emergency.  Safety protocols were in place, including screening questions prior to the visit, additional usage of staff PPE, and extensive cleaning of exam room while observing appropriate contact time as indicated for disinfecting solutions.   Reviewed advanced directives Reviewed other doctors--Dr Shah--neurology, Dr Fath--cardiology, Dr Lateef/Kolluru--nephrology, [facility] dental,  Having trouble with her hearing aides No exercise Vision is okay 1 fall--no sig injury No alcohol or tobacco Depressed Still does housework. Doesn't drive Has memory problems----put on donepezil by neurology. Uses phone to remind her to take her medications  "I'm depressed" Frustrated --hearing aides aren't working right At home---can't walk and is very limited (very depressing) Meds are not helping---other than the pain No social outlets---hasn't been able to find a church for her Does have one neighbor who helps her--but doesn't spend much time Son in [place] busy also (multiple excuses) Ran out of duloxetine--missed today's dose  Rectal urgency in the AM---keeps her from going out in the morning Did have colonoscopy last week ([facility]?)----had 5 polyps  Does check sugars Usually under 120 Ongoing neuropathy pain Continues on tramadol 100 tid for her pain  No chest pain No SOB No dizziness or syncope No edema No palpitations  Last GFR 55  Current Outpatient Medications on File Prior to Visit  Medication Sig Dispense Refill   Accu-Chek Softclix Lancets lancets Use as instructed 100 each 3   acetaminophen (TYLENOL) 500 MG tablet Take 500 mg by mouth every 4 (four) hours as needed for mild pain or fever.      amLODipine (NORVASC) 5 MG tablet TAKE 1  TABLET (5 MG TOTAL) BY MOUTH DAILY. 90 tablet 3   Cholecalciferol (VITAMIN D3) 25 MCG (1000 UT) CAPS Take 1,000 Units by mouth daily.      Chromium Picolinate 1000 MCG TABS Take 1,000 mcg by mouth daily.      clopidogrel (PLAVIX) 75 MG tablet Take 75 mg by mouth daily.     Coenzyme Q10 (CO Q 10) 100 MG CAPS Take 100 mg by mouth daily.      Digestive Enzymes CAPS Take 2 capsules by mouth daily.      DULoxetine (CYMBALTA) 30 MG capsule Take 1 capsule (30 mg total) by mouth daily. 90 capsule 3   fluconazole (DIFLUCAN) 150 MG tablet Take 1 tablet (150 mg total) by mouth once a week. 13 tablet 3   gabapentin (NEURONTIN) 300 MG capsule TAKE TWO CAPSULES BY MOUTH TWICE A DAY TAKE 3 CAPSULES AT BEDTIME 630 capsule 4   glucose blood (ONETOUCH VERIO) test strip Use to check blood sugar once a day Dx Code E11.21 100 strip 12   hydrALAZINE (APRESOLINE) 25 MG tablet Take 1 tablet (25 mg total) by mouth 4 (four) times daily. 120 tablet 11   lisinopril-hydrochlorothiazide (ZESTORETIC) 20-25 MG tablet Take 1 tablet by mouth in the morning and at bedtime.     metFORMIN (GLUCOPHAGE-XR) 500 MG 24 hr tablet TAKE 1 TABLET BY MOUTH EVERY DAY WITH BREAKFAST 90 tablet 3   Multiple Vitamins-Minerals (MULTIVITAMIN WITH MINERALS) tablet Take 1 tablet by mouth daily.      Omega-3 Fatty Acids (FISH OIL) 1000 MG CAPS Take 2,000 mg by mouth.     Soya Lecithin 1200 MG CAPS Take 1,200 mg  by mouth 3 (three) times daily.      traMADol (ULTRAM) 50 MG tablet Take 2 tablets (100 mg total) by mouth 3 (three) times daily as needed. 180 tablet 0   vitamin C (ASCORBIC ACID) 500 MG tablet Take 500 mg by mouth daily.     atorvastatin (LIPITOR) 80 MG tablet Take 1 tablet (80 mg total) by mouth daily. 30 tablet 2   donepezil (ARICEPT) 5 MG tablet Take 5 mg by mouth at bedtime.      No current facility-administered medications on file prior to visit.    No Known Allergies  Past Medical History:  Diagnosis Date   Arthritis     Confusion    Diabetes mellitus without complication ([place])    type 2   Hepatitis    pt. denies   Hypertension    Lumbar stenosis    Memory loss    Nocturia    Numbness and tingling    legs and feet   Wears glasses     Past Surgical History:  Procedure Laterality Date   CESAREAN SECTION  1978   COLONOSCOPY     HEMORRHOID SURGERY  2015   JOINT REPLACEMENT     01-08-18 Dr. Kathrynn Speed   TOTAL HIP ARTHROPLASTY Left 01/08/2018   Procedure: LEFT TOTAL HIP ARTHROPLASTY ANTERIOR APPROACH;  Surgeon: Mcarthur Rossetti, MD;  Location: [facility];  Service: Orthopedics;  Laterality: Left;   TRANSFORAMINAL LUMBAR INTERBODY FUSION (TLIF) WITH PEDICLE SCREW FIXATION 2 LEVEL N/A 11/07/2015   Procedure: Lumbar three-four Lumbar four-five  transforaminal lumbar interbody fusion with interbody prosthesis posterior lateral arthrodesis and posterior segmental instrumentation;  Surgeon: Newman Pies, MD;  Location: [facility];  Service: Neurosurgery;  Laterality: N/A;    Family History  Problem Relation Age of Onset   Hypertension Other    Cancer Other        ovarian   Cancer Mother    Heart disease Father     Social History   Socioeconomic History   Marital status: Divorced    Spouse name: Not on file   Number of children: 4   Years of education: Not on file   Highest education level: Associate degree: occupational, Hotel manager, or vocational program  Occupational History   Not on file  Tobacco Use   Smoking status: Never   Smokeless tobacco: Never  Vaping Use   Vaping Use: Never used  Substance and Sexual Activity   Alcohol use: No    Comment: rarely   Drug use: No   Sexual activity: Not Currently  Other Topics Concern   Not on file  Social History Narrative   Has a living will.   Son is health care POA   Would accept resuscitation.     Would accept life support    No tube feeds if cognitively unaware   Social Determinants of Health   Financial Resource Strain: Not on  file  Food Insecurity: Not on file  Transportation Needs: Not on file  Physical Activity: Not on file  Stress: Not on file  Social Connections: Not on file  Intimate Partner Violence: Not on file   Review of Systems Eating okay--but not much appetite  Weight fairly stable Sleeps okay with her medications No heartburn or dysphagia No dysuria or hematuria. Some urge incontinence Teeth are okay No suspicious skin lesions----rash under abd folds is better     Objective:   Physical Exam Constitutional:      Appearance: Normal appearance.  Cardiovascular:     Rate and Rhythm: Normal rate and regular rhythm.     Heart sounds: No murmur heard.   No gallop.     Comments: Faint pedal pulses Pulmonary:     Effort: Pulmonary effort is normal.     Breath sounds: Normal breath sounds. No wheezing or rales.  Abdominal:     Palpations: Abdomen is soft.     Tenderness: There is no abdominal tenderness.  Musculoskeletal:     Cervical back: Neck supple.  Lymphadenopathy:     Cervical: No cervical adenopathy.  Skin:    Findings: No rash.     Comments: No foot lesions  Neurological:     Mental Status: She is alert.     Comments: Memory done by neurologist Decreased sensation in feet  Psychiatric:     Comments: Crying tearful---then some better ?somewhat histrionic           Assessment & Plan:

## 2021-06-27 NOTE — Patient Instructions (Signed)
Please stop the metformin and watch your sugars. I think this will help your bowels.  Increase the duloxetine to 60mg  daily (take 2 of the 30mg  till they run out). I will see you again in 1 month to make sure your mood is better

## 2021-06-27 NOTE — Assessment & Plan Note (Signed)
Doing well with this Will try off the metformin due to trouble with stools On glipizide

## 2021-06-27 NOTE — Assessment & Plan Note (Signed)
Now more depressed Mostly related to disability, limitations in travel and social outlets Will increase the duloxetine and refer for community services

## 2021-06-28 LAB — LIPID PANEL
Cholesterol: 133 mg/dL (ref 0–200)
HDL: 77.3 mg/dL (ref >39.00)
LDL Cholesterol: 36 mg/dL (ref 0–99)
NonHDL: 55.2
Total CHOL/HDL Ratio: 2
Triglycerides: 98 mg/dL (ref 0.0–149.0)
VLDL: 19.6 mg/dL (ref 0.0–40.0)

## 2021-06-28 LAB — CBC
HCT: 44 % (ref 36.0–46.0)
Hemoglobin: 14.6 g/dL (ref 12.0–15.0)
MCHC: 33.1 g/dL (ref 30.0–36.0)
MCV: 97.5 fl (ref 78.0–100.0)
Platelets: 335 10*3/uL (ref 150.0–400.0)
RBC: 4.51 Mil/uL (ref 3.87–5.11)
RDW: 13.2 % (ref 11.5–15.5)
WBC: 10.4 10*3/uL (ref 4.0–10.5)

## 2021-06-28 LAB — RENAL FUNCTION PANEL
Albumin: 4.5 g/dL (ref 3.5–5.2)
BUN: 25 mg/dL — ABNORMAL HIGH (ref 6–23)
CO2: 27 mEq/L (ref 19–32)
Calcium: 10 mg/dL (ref 8.4–10.5)
Chloride: 101 mEq/L (ref 96–112)
Creatinine, Ser: 1.43 mg/dL — ABNORMAL HIGH (ref 0.40–1.20)
GFR: 36.48 mL/min — ABNORMAL LOW (ref >60.00)
Glucose, Bld: 92 mg/dL (ref 70–99)
Phosphorus: 3.7 mg/dL (ref 2.3–4.6)
Potassium: 3.8 mEq/L (ref 3.5–5.1)
Sodium: 141 mEq/L (ref 135–145)

## 2021-06-28 LAB — HEPATIC FUNCTION PANEL
ALT: 16 U/L (ref 0–35)
AST: 20 U/L (ref 0–37)
Albumin: 4.5 g/dL (ref 3.5–5.2)
Alkaline Phosphatase: 62 U/L (ref 39–117)
Bilirubin, Direct: 0.1 mg/dL (ref 0.0–0.3)
Total Bilirubin: 0.5 mg/dL (ref 0.2–1.2)
Total Protein: 6.8 g/dL (ref 6.0–8.3)

## 2021-06-28 LAB — HEMOGLOBIN A1C: Hgb A1c MFr Bld: 6.5 % (ref 4.6–6.5)

## 2021-06-29 ENCOUNTER — Other Ambulatory Visit: Payer: Self-pay | Admitting: Internal Medicine

## 2021-07-01 ENCOUNTER — Telehealth: Payer: Self-pay | Admitting: *Deleted

## 2021-07-01 NOTE — Telephone Encounter (Signed)
Last filled #[id number] 05-27-21 Last OV 06-27-21 Next OV 07-30-21 [facility]

## 2021-07-01 NOTE — Chronic Care Management (AMB) (Signed)
  Chronic Care Management   Note  07/01/2021 Name: [name] MRN: [id number] DOB: Apr 12, 1946  [name] is a 75 y.o. year old female who is a primary care patient of Venia Carbon, MD. I reached out to [name] by phone today in response to a referral sent by [name] PCP.  [name] was given information about Chronic Care Management services today including:  CCM service includes personalized support from designated clinical staff supervised by her physician, including individualized plan of care and coordination with other care providers 24/7 contact phone numbers for assistance for urgent and routine care needs. Service will only be billed when office clinical staff spend 20 minutes or more in a month to coordinate care. Only one practitioner may furnish and bill the service in a calendar month. The patient may stop CCM services at any time (effective at the end of the month) by phone call to the office staff. The patient is responsible for co-pay (up to 20% after annual deductible is met) if co-pay is required by the individual health plan.   Patient agreed to services and verbal consent obtained.   Follow up plan: Telephone appointment with care management team member scheduled for: 07/09/2021  Julian Hy, [place] Management  Direct Dial: [phone]

## 2021-07-02 ENCOUNTER — Telehealth: Payer: Self-pay | Admitting: *Deleted

## 2021-07-02 NOTE — Telephone Encounter (Signed)
   Telephone encounter was:  Unsuccessful.  07/02/2021 Name: LIBERTIE HAUSLER MRN: [id number] DOB: 07/02/1946  Unsuccessful outbound call made today to assist with:   finacial  Outreach Attempt:  1st Attempt  A HIPAA compliant voice message was left requesting a return call.  Instructed patient to call back at   Instructed patient to call back at [phone]  at their earliest convenience. .  [place], Care Management  [phone] [address] , [place] [address] Email : [email or web address] [email or web address]

## 2021-07-09 ENCOUNTER — Ambulatory Visit (INDEPENDENT_AMBULATORY_CARE_PROVIDER_SITE_OTHER): Payer: Medicare Other | Admitting: *Deleted

## 2021-07-09 ENCOUNTER — Telehealth: Payer: Self-pay | Admitting: *Deleted

## 2021-07-09 DIAGNOSIS — E1140 Type 2 diabetes mellitus with diabetic neuropathy, unspecified: Secondary | ICD-10-CM

## 2021-07-09 DIAGNOSIS — F322 Major depressive disorder, single episode, severe without psychotic features: Secondary | ICD-10-CM | POA: Diagnosis not present

## 2021-07-09 DIAGNOSIS — G894 Chronic pain syndrome: Secondary | ICD-10-CM

## 2021-07-09 NOTE — Patient Instructions (Signed)
Visit Information   PATIENT GOALS/PLAN OF CARE:  Care Plan : LCSW Plan of Care  Updates made by Alexis Peer, LCSW since 07/09/2021 12:00 AM     Problem: Depression   Priority: High     Long-Range Goal: Reduce symptoms and better manage symptoms of depression   Start Date: 07/09/2021  Expected End Date: 09/06/2021  This Visit's Progress: On track  Priority: High  Note:   Current Barriers:  Severe Persistent Mental Health needs related to depression Limited social support, Transportation, Mental Health Concerns , and Social Isolation Suicidal Ideation/Homicidal Ideation: No  Clinical Social Work Goal(s):  patient will work with SW  as needed  by telephone to reduce or manage symptoms related to depression demonstrate a reduction in symptoms related to :Depression: depressed mood hopelessness disturbed sleep decreased appetite   Interventions: Pt admits to depression; signs of apathy and overall lack of purpose. Pt shared that her Cymbalta was just increased from 30 to 33m QD about one week ago.  CSW completed PHQ9 Depression screening with pt; scoring a "15" (moderately severe). Pt denies SI/HI- shared a lot of emotional strain she is having along with medical conditions. She wants to wait until follow up in 4-6 weeks to decide on counseling; hoping the RX dose increase will make a difference. Patient interviewed and appropriate assessments performed: PHQ 9 1:1 collaboration with Alexis Carbon MD regarding development and update of comprehensive plan of care as evidenced by provider attestation and co-signature Patient interviewed and appropriate assessments performed Provided patient with information about counseling support which she wants to wait and see how the RX adjustment helps Discussed plans with patient for ongoing care management follow up and provided patient with direct contact information for care management team Depression screen reviewed  PHQ2/ PHQ9  completed Active listening / Reflection utilized  Emotional Support Provided Problem [place]ateg[place] reviewed Reviewed mental health medications with patient and discussed importance of compliance:   Patient Self Care Activities:  Self administers medications as prescribed Attends all scheduled provider appointments Calls pharmacy for medication refills Performs ADL's independently Calls provider office for new concerns or questions Ability for insight Independent living Motivation for treatment  Patient Coping Strengths:  Supportive Relationships Family Friends Able to Communicate Effectively  Patient Self Care Deficits:  Lacks social connections  Patient Goals: - avoid negative self-talk - develop a personal safety plan - develop a plan to deal with triggers like holidays, anniversaries - exercise at least 2 to 3 times per week - have a plan for how to handle bad days - journal feelings and what helps to feel better or worse - spend time or talk with others at least 2 to 3 times per week - spend time or talk with others every day Follow Up Plan: Appointment scheduled for SW follow up with client by phone on:  08/19/21     Consent to CCM Services: [name] given information about Chronic Care Management services including:  CCM service includes personalized support from designated clinical staff supervised by her physician, including individualized plan of care and coordination with other care providers 24/7 contact phone numbers for assistance for urgent and routine care needs. Service will only be billed when office clinical staff spend 20 minutes or more in a month to coordinate care. Only one practitioner may furnish and bill the service in a calendar month. The patient may stop CCM services at any time (effective at the end of the month) by  phone call to the office staff. The patient will be responsible for cost sharing (co-pay) of up to 20%  of the service fee (after annual deductible is met).  Patient agreed to services and verbal consent obtained.   Patient verbalizes understanding of instructions provided today and agrees to view in [place].   Telephone follow up appointment with care management team member scheduled for:08/19/21  [name] MSW, LCSW Licensed Clinical Social Worker [place] [phone]

## 2021-07-09 NOTE — Telephone Encounter (Signed)
   Telephone encounter was:  Unsuccessful.  07/09/2021 Name: [name] MRN: [id number] DOB: 1945/09/22  Unsuccessful outbound call made today to assist with:  \  Outreach Attempt:  2nd Attempt  A HIPAA compliant voice message was left requesting a return call.  Instructed patient to call back at   Instructed patient to call back at [phone]  at their earliest convenience. .  [place], Care Management  [phone] [address] , [place] [address] Email : [email or web address] [email or web address]

## 2021-07-09 NOTE — Chronic Care Management (AMB) (Signed)
Chronic Care Management    Clinical Social Work Note  07/09/2021 Name: [name] MRN: [id number] DOB: [birth year]  [name] is a 75 y.o. year old female who is a primary care patient of [place], Theophilus Kinds, MD. The CCM team was consulted to assist the patient with chronic disease management and/or care coordination needs related to: [place] and [facility].   Engaged with patient by telephone for initial visit in response to provider referral for social work chronic care management and care coordination services.   Consent to Services:  The patient was given information about Chronic Care Management services, agreed to services, and gave verbal consent prior to initiation of services.  Please see initial visit note for detailed documentation.   Patient agreed to services and consent obtained.   Assessment: Review of patient past medical history, allergies, medications, and health status, including review of relevant consultants reports was performed today as part of a comprehensive evaluation and provision of chronic care management and care coordination services.     SDOH (Social Determinants of Health) assessments and interventions performed:  SDOH Interventions    Flowsheet Row Most Recent Value  SDOH Interventions   Depression Interventions/Treatment  Counseling, Medication        Advanced Directives Status: Not addressed in this encounter.  CCM Care Plan  No Known Allergies  Outpatient Encounter Medications as of 07/09/2021  Medication Sig   Accu-Chek Softclix Lancets lancets Use as instructed   acetaminophen (TYLENOL) 500 MG tablet Take 500 mg by mouth every 4 (four) hours as needed for mild pain or fever.    amLODipine (NORVASC) 5 MG tablet TAKE 1 TABLET (5 MG TOTAL) BY MOUTH DAILY.   atorvastatin (LIPITOR) 80 MG tablet Take 1 tablet (80 mg total) by mouth daily.   Cholecalciferol (VITAMIN D3) 25 MCG (1000 UT) CAPS Take 1,000 Units by mouth daily.     Chromium Picolinate 1000 MCG TABS Take 1,000 mcg by mouth daily.    clopidogrel (PLAVIX) 75 MG tablet Take 75 mg by mouth daily.   Coenzyme Q10 (CO Q 10) 100 MG CAPS Take 100 mg by mouth daily.    Digestive Enzymes CAPS Take 2 capsules by mouth daily.    donepezil (ARICEPT) 5 MG tablet Take 5 mg by mouth at bedtime.    DULoxetine (CYMBALTA) 60 MG capsule Take 1 capsule (60 mg total) by mouth daily.   fluconazole (DIFLUCAN) 150 MG tablet Take 1 tablet (150 mg total) by mouth once a week.   gabapentin (NEURONTIN) 300 MG capsule TAKE TWO CAPSULES BY MOUTH TWICE A DAY TAKE 3 CAPSULES AT BEDTIME   glucose blood (ONETOUCH VERIO) test strip Use to check blood sugar once a day Dx Code E11.21   hydrALAZINE (APRESOLINE) 25 MG tablet TAKE 1 TABLET (25 MG TOTAL) BY MOUTH 4 (FOUR) TIMES DAILY.   lisinopril-hydrochlorothiazide (ZESTORETIC) 20-25 MG tablet TAKE 2 TABLETS BY MOUTH EVERY DAY   Multiple Vitamins-Minerals (MULTIVITAMIN WITH MINERALS) tablet Take 1 tablet by mouth daily.    Omega-3 Fatty Acids (FISH OIL) 1000 MG CAPS Take 2,000 mg by mouth.   Soya Lecithin 1200 MG CAPS Take 1,200 mg by mouth 3 (three) times daily.    traMADol (ULTRAM) 50 MG tablet TAKE 2 TABLETS (100 MG TOTAL) BY MOUTH 3 (THREE) TIMES DAILY AS NEEDED.   vitamin C (ASCORBIC ACID) 500 MG tablet Take 500 mg by mouth daily.   No facility-administered encounter medications on file as of 07/09/2021.  Patient Active Problem List   Diagnosis Date Noted   Candidal intertrigo 03/29/2021   History of stroke 07/04/2020   Chronic diastolic CHF (congestive heart failure) ([place]) 06/10/2020   Stage 3b chronic kidney disease ([place]) 06/10/2020   Type II diabetes mellitus with renal manifestations ([place]) 06/10/2020   Abnormal MRI    Shoulder tendonitis, left 07/25/2019   Chronic narcotic dependence ([place]) 07/04/2019   Osteoarthritis of right knee 11/16/2018   Urge incontinence 10/07/2018   Mycotic toenails 10/07/2018   Venous  insufficiency 10/07/2018   Unilateral primary osteoarthritis, left hip 01/08/2018   Status post total replacement of left hip 01/08/2018   Pain syndrome, chronic 03/13/2017   Facet arthropathy, lumbar 03/13/2017   MDD (major depressive disorder), single episode 05/07/2016   Advance directive discussed with patient 01/15/2016   Spinal stenosis of lumbar region with neurogenic claudication 11/07/2015   Other intervertebral disc degeneration, lumbar region 06/01/2015   Chronic back pain 03/29/2015   Preventative health care 01/10/2015   Osteoarthritis, hip, bilateral 11/28/2014   Peripheral neuropathy 11/28/2014   Type 2 diabetes, controlled, with neuropathy ([place]) 01/20/2014   Episodic mood disorder ([place]) 01/20/2014   MCI (mild cognitive impairment) 12/26/2013   IBS (irritable bowel syndrome) 12/27/2012   Essential hypertension, benign 08/06/2010    Conditions to be addressed/monitored: Depression; Mental Health Concerns   Care Plan : LCSW Plan of Care  Updates made by Deirdre Peer, LCSW since 07/09/2021 12:00 AM     Problem: Depression   Priority: High     Long-Range Goal: Reduce symptoms and better manage symptoms of depression   Start Date: 07/09/2021  Expected End Date: 09/06/2021  This Visit's Progress: On track  Priority: High  Note:   Current Barriers:  Severe Persistent Mental Health needs related to depression Limited social support, Transportation, Mental Health Concerns , and Social Isolation Suicidal Ideation/Homicidal Ideation: No  Clinical Social Work Goal(s):  patient will work with SW  as needed  by telephone to reduce or manage symptoms related to depression demonstrate a reduction in symptoms related to :Depression: depressed mood hopelessness disturbed sleep decreased appetite   Interventions: Pt admits to depression; signs of apathy and overall lack of purpose. Pt shared that her Cymbalta was just increased from 30 to 60mg  QD about one week ago.   CSW completed PHQ9 Depression screening with pt; scoring a "15" (moderately severe). Pt denies SI/HI- shared a lot of emotional strain she is having along with medical conditions. She wants to wait until follow up in 4-6 weeks to decide on counseling; hoping the RX dose increase will make a difference. Patient interviewed and appropriate assessments performed: PHQ 9 1:1 collaboration with Venia Carbon, MD regarding development and update of comprehensive plan of care as evidenced by provider attestation and co-signature Patient interviewed and appropriate assessments performed Provided patient with information about counseling support which she wants to wait and see how the RX adjustment helps Discussed plans with patient for ongoing care management follow up and provided patient with direct contact information for care management team Depression screen reviewed  PHQ2/ PHQ9 completed Active listening / Reflection utilized  Emotional Support Provided Problem [place] strategies reviewed Reviewed mental health medications with patient and discussed importance of compliance:   Patient Self Care Activities:  Self administers medications as prescribed Attends all scheduled provider appointments Calls pharmacy for medication refills Performs ADL's independently Calls provider office for new concerns or questions Ability for insight Independent living Motivation for treatment  Patient Coping Strengths:  Supportive Relationships Family Friends Able to Communicate Effectively  Patient Self Care Deficits:  Lacks social connections  Patient Goals: - avoid negative self-talk - develop a personal safety plan - develop a plan to deal with triggers like holidays, anniversaries - exercise at least 2 to 3 times per week - have a plan for how to handle bad days - journal feelings and what helps to feel better or worse - spend time or talk with others at least 2 to 3 times per  week - spend time or talk with others every day Follow Up Plan: Appointment scheduled for SW follow up with client by phone on:  08/19/21      Follow Up Plan: Appointment scheduled for SW follow up with client by phone on: 08/19/21      Eduard Clos MSW, [place] Licensed Clinical Social Worker [facility] [phone]

## 2021-07-15 ENCOUNTER — Telehealth: Payer: Self-pay | Admitting: *Deleted

## 2021-07-15 NOTE — Telephone Encounter (Signed)
   Telephone encounter was:  Unsuccessful.  07/15/2021 Name: [name] MRN: [id number] DOB: [birth year]  Unsuccessful outbound call made today to assist with:  Food Insecurity  Outreach Attempt:  3rd Attempt.  Referral closed unable to contact patient.  A HIPAA compliant voice message was left requesting a return call.  Instructed patient to call back at   Instructed patient to call back at [phone]  at their earliest convenience. .  [place], Care Management  [phone] [address] , [place] [address] Email : Ashby [email or web address]

## 2021-07-17 ENCOUNTER — Other Ambulatory Visit: Payer: Self-pay | Admitting: Internal Medicine

## 2021-07-19 MED ORDER — TRAMADOL HCL 50 MG PO TABS: 100.0000 mg | ORAL_TABLET | Freq: Three times a day (TID) | ORAL | 0 refills | Status: DC | PRN

## 2021-07-22 NOTE — Telephone Encounter (Signed)
I spoke with pt and [facility] would not fill tramadol without verbal confirmation from Dr Alla German office. I spoke with Lennette Bihari at [facility] and he said he did need verbal confirmation to fill tramadol early; confirmed Dr Silvio Pate did want pt to get emergency fill of tramadol. Lennette Bihari said would not go thru ins but used a discount card and tramadol will be ready for pick up 07/22/21 at 11 AM and pt cost will be $23.72. sending note to [facility] CMA as FYI.

## 2021-07-22 NOTE — Telephone Encounter (Signed)
[place] Night - Client Nonclinical Telephone Record  [facility] Client [facility] Night - Client Client Site [place] Physician Viviana Simpler- MD Contact Type Call Who Is Calling Patient / Member / Family / Caregiver Caller Name [place] Phone Number [phone] Patient Name [name] Patient DOB [birth year] Call Type Message Only Information Provided Reason for Call Request for General Office Information Initial Comment Caller states she would like send a message to the physicians Additional Comment [facility] to [facility] because it's too soon to refill the prescription. Disp. Time Disposition Final User 07/21/2021 5:48:46 PM General Information Provided Yes [place] Call Closed By: Mack [place] Transaction Date/Time: 07/21/2021 5:45:37 PM (ET

## 2021-07-30 ENCOUNTER — Telehealth: Payer: Self-pay | Admitting: Internal Medicine

## 2021-07-30 ENCOUNTER — Ambulatory Visit: Payer: Medicare Other | Admitting: Internal Medicine

## 2021-07-30 MED ORDER — ARIPIPRAZOLE 2 MG PO TABS: 2.0000 mg | ORAL_TABLET | Freq: Every day | ORAL | 1 refills | Status: DC
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
## 2021-07-30 NOTE — Telephone Encounter (Signed)
Let her know that I think it is better if I add another medication to the duloxetine. I have sent the lowest dose of aripiprazole. It is an antipsychotic (make sure she knows I don't think she is psychotic) that is also FDA approved for severe depression. It can increase appetite and people sometimes gain weight on it--she should be careful. She should also monitor her sugars.  Have her set up appt in 2-3 weeks to review status

## 2021-07-30 NOTE — Telephone Encounter (Signed)
Spoke to pt. Made appt 08-19-21.

## 2021-07-31 ENCOUNTER — Other Ambulatory Visit: Payer: Self-pay | Admitting: Internal Medicine

## 2021-07-31 NOTE — Telephone Encounter (Signed)
Last filled #180 06-29-21 (lost #72 earlier in the month and those were replaced) Last OV 06-27-21 Next OV 08-19-21 [facility]

## 2021-08-02 ENCOUNTER — Encounter: Payer: Self-pay | Admitting: Internal Medicine

## 2021-08-04 ENCOUNTER — Other Ambulatory Visit: Payer: Self-pay | Admitting: Internal Medicine

## 2021-08-04 MED ORDER — TRAMADOL HCL 50 MG PO TABS
100.0000 mg | ORAL_TABLET | Freq: Three times a day (TID) | ORAL | 0 refills | Status: DC | PRN
Start: 2021-08-04 — End: 2021-08-08

## 2021-08-05 NOTE — Telephone Encounter (Signed)
Last OV - 06/27/2021 Next OV - 08/19/2021 Last Filled- 08/04/2021

## 2021-08-08 ENCOUNTER — Other Ambulatory Visit: Payer: Self-pay

## 2021-08-08 ENCOUNTER — Telehealth (INDEPENDENT_AMBULATORY_CARE_PROVIDER_SITE_OTHER): Payer: Medicare Other | Admitting: Internal Medicine

## 2021-08-08 ENCOUNTER — Encounter: Payer: Self-pay | Admitting: Internal Medicine

## 2021-08-08 DIAGNOSIS — M48062 Spinal stenosis, lumbar region with neurogenic claudication: Secondary | ICD-10-CM | POA: Diagnosis not present

## 2021-08-08 DIAGNOSIS — F324 Major depressive disorder, single episode, in partial remission: Secondary | ICD-10-CM | POA: Diagnosis not present

## 2021-08-08 MED ORDER — HYDROCODONE-ACETAMINOPHEN 7.5-325 MG PO TABS: 1.0000 | ORAL_TABLET | Freq: Four times a day (QID) | ORAL | 0 refills | Status: DC | PRN

## 2021-08-08 NOTE — Progress Notes (Signed)
Subjective:    Patient ID: [name], female    DOB: 1946/02/12, 75 y.o.   MRN: [id number]  HPI Telephone virtual visit for review of pain and depression Identification done Reviewed limitations and billing and she gave consent Participants ---patient at home and I am in my office  Has been very depressed  She relates this to pain control issues---despite the tramadol 100mg  tid Also on gabapentin--- now just using at bedtime (it makes her sleep if she takes during the day) On the duloxetine--thinks this is adequate for depression (if she had better pain control)  Current Outpatient Medications on File Prior to Visit  Medication Sig Dispense Refill   Accu-Chek Softclix Lancets lancets Use as instructed 100 each 3   acetaminophen (TYLENOL) 500 MG tablet Take 500 mg by mouth every 4 (four) hours as needed for mild pain or fever.      amLODipine (NORVASC) 5 MG tablet TAKE 1 TABLET (5 MG TOTAL) BY MOUTH DAILY. 90 tablet 3   ARIPiprazole (ABILIFY) 2 MG tablet Take 1 tablet (2 mg total) by mouth at bedtime. 30 tablet 1   Cholecalciferol (VITAMIN D3) 25 MCG (1000 UT) CAPS Take 1,000 Units by mouth daily.      Chromium Picolinate 1000 MCG TABS Take 1,000 mcg by mouth daily.      clopidogrel (PLAVIX) 75 MG tablet Take 75 mg by mouth daily.     Coenzyme Q10 (CO Q 10) 100 MG CAPS Take 100 mg by mouth daily.      Digestive Enzymes CAPS Take 2 capsules by mouth daily.      DULoxetine (CYMBALTA) 60 MG capsule Take 1 capsule (60 mg total) by mouth daily. 60 capsule 11   gabapentin (NEURONTIN) 300 MG capsule TAKE TWO CAPSULES BY MOUTH TWICE A DAY TAKE 3 CAPSULES AT BEDTIME 630 capsule 4   glucose blood (ONETOUCH VERIO) test strip Use to check blood sugar once a day Dx Code E11.21 100 strip 12   hydrALAZINE (APRESOLINE) 25 MG tablet TAKE 1 TABLET (25 MG TOTAL) BY MOUTH 4 (FOUR) TIMES DAILY. 360 tablet 3   lisinopril-hydrochlorothiazide (ZESTORETIC) 20-25 MG tablet TAKE 2 TABLETS BY MOUTH EVERY  DAY 180 tablet 3   Multiple Vitamins-Minerals (MULTIVITAMIN WITH MINERALS) tablet Take 1 tablet by mouth daily.      Omega-3 Fatty Acids (FISH OIL) 1000 MG CAPS Take 2,000 mg by mouth.     Soya Lecithin 1200 MG CAPS Take 1,200 mg by mouth 3 (three) times daily.      traMADol (ULTRAM) 50 MG tablet Take 2 tablets (100 mg total) by mouth 3 (three) times daily as needed. 180 tablet 0   vitamin C (ASCORBIC ACID) 500 MG tablet Take 500 mg by mouth daily.     atorvastatin (LIPITOR) 80 MG tablet Take 1 tablet (80 mg total) by mouth daily. 30 tablet 2   donepezil (ARICEPT) 5 MG tablet Take 5 mg by mouth at bedtime.      No current facility-administered medications on file prior to visit.    No Known Allergies  Past Medical History:  Diagnosis Date   Arthritis    Confusion    Diabetes mellitus without complication ([place])    type 2   Hepatitis    pt. denies   Hypertension    Lumbar stenosis    Memory loss    Nocturia    Numbness and tingling    legs and feet   Wears glasses     Past  Surgical History:  Procedure Laterality Date   CESAREAN SECTION  1978   COLONOSCOPY     HEMORRHOID SURGERY  2015   JOINT REPLACEMENT     01-08-18 Dr. Kathrynn Speed   TOTAL HIP ARTHROPLASTY Left 01/08/2018   Procedure: LEFT TOTAL HIP ARTHROPLASTY ANTERIOR APPROACH;  Surgeon: Mcarthur Rossetti, MD;  Location: [facility];  Service: Orthopedics;  Laterality: Left;   TRANSFORAMINAL LUMBAR INTERBODY FUSION (TLIF) WITH PEDICLE SCREW FIXATION 2 LEVEL N/A 11/07/2015   Procedure: Lumbar three-four Lumbar four-five  transforaminal lumbar interbody fusion with interbody prosthesis posterior lateral arthrodesis and posterior segmental instrumentation;  Surgeon: Newman Pies, MD;  Location: [facility];  Service: Neurosurgery;  Laterality: N/A;    Family History  Problem Relation Age of Onset   Hypertension Other    Cancer Other        ovarian   Cancer Mother    Heart disease Father     Social History    Socioeconomic History   Marital status: Divorced    Spouse name: Not on file   Number of children: 4   Years of education: Not on file   Highest education level: Associate degree: occupational, Hotel manager, or vocational program  Occupational History   Not on file  Tobacco Use   Smoking status: Never   Smokeless tobacco: Never  Vaping Use   Vaping Use: Never used  Substance and Sexual Activity   Alcohol use: No    Comment: rarely   Drug use: No   Sexual activity: Not Currently  Other Topics Concern   Not on file  Social History Narrative   Has a living will.   Son is health care POA   Would accept resuscitation.     Would accept life support    No tube feeds if cognitively unaware   Social Determinants of Health   Financial Resource Strain: Not on file  Food Insecurity: Not on file  Transportation Needs: Not on file  Physical Activity: Not on file  Stress: Not on file  Social Connections: Not on file  Intimate Partner Violence: Not on file   Review of Systems Sleeps okay with the gabapentin and low dose aripiprazole Appetite is okay     Objective:   Physical Exam Neurological:     Mental Status: She is alert.  Psychiatric:     Comments: Normal speech--doesn't seem depressed in the conversation           Assessment & Plan:

## 2021-08-08 NOTE — Assessment & Plan Note (Signed)
Having more problems with depression due to pain Continue duloxetine/abilify for now If improves with better pain relief, will try to get her off the abilify

## 2021-08-08 NOTE — Assessment & Plan Note (Signed)
Still with severe back pain despite maximal tramadol Discussed alternatives---will switch to intermediate norco--- 7.5/325 tid (told her to cut the first one in half just in case) Stop the tramadol (though she could take an extra one if the norco is not working well---till we can titrate)  Time of visit 12 minutes

## 2021-08-15 ENCOUNTER — Encounter: Payer: Self-pay | Admitting: Internal Medicine

## 2021-08-19 ENCOUNTER — Telehealth: Payer: Medicare Other | Admitting: Internal Medicine

## 2021-08-19 ENCOUNTER — Ambulatory Visit (INDEPENDENT_AMBULATORY_CARE_PROVIDER_SITE_OTHER): Payer: Medicare Other | Admitting: *Deleted

## 2021-08-19 ENCOUNTER — Other Ambulatory Visit: Payer: Self-pay

## 2021-08-19 ENCOUNTER — Encounter: Payer: Self-pay | Admitting: Internal Medicine

## 2021-08-19 DIAGNOSIS — F324 Major depressive disorder, single episode, in partial remission: Secondary | ICD-10-CM

## 2021-08-19 DIAGNOSIS — G894 Chronic pain syndrome: Secondary | ICD-10-CM

## 2021-08-19 DIAGNOSIS — G8929 Other chronic pain: Secondary | ICD-10-CM

## 2021-08-19 DIAGNOSIS — F322 Major depressive disorder, single episode, severe without psychotic features: Secondary | ICD-10-CM

## 2021-08-19 DIAGNOSIS — E1140 Type 2 diabetes mellitus with diabetic neuropathy, unspecified: Secondary | ICD-10-CM

## 2021-08-19 NOTE — Progress Notes (Signed)
   Subjective:    Patient ID: [name], female    DOB: 1946/03/14, 75 y.o.   MRN: [id number]  HPI Telephone virtual visit to follow up on pain control   Multiple attempts to reach her went straight to voicemail  She will need an in person visit     Review of Systems     Objective:   Physical Exam         Assessment & Plan:

## 2021-08-19 NOTE — Assessment & Plan Note (Signed)
Unable to do visit She will need to schedule in office

## 2021-08-19 NOTE — Chronic Care Management (AMB) (Addendum)
Chronic Care Management    Clinical Social Work Note  08/19/2021 Name: [name] MRN: [id number] DOB: 12/26/1945  [name] is a 75 y.o. year old female who is a primary care patient of Letvak, Theophilus Kinds, MD. The CCM team was consulted to assist the patient with chronic disease management and/or care coordination needs related to: [employer]  and [place] and Resources.   Engaged with patient by telephone for follow up visit in response to provider referral for social work chronic care management and care coordination services.   Consent to Services:  The patient was given information about Chronic Care Management services, agreed to services, and gave verbal consent prior to initiation of services.  Please see initial visit note for detailed documentation.   Patient agreed to services and consent obtained.   Assessment: Review of patient past medical history, allergies, medications, and health status, including review of relevant consultants reports was performed today as part of a comprehensive evaluation and provision of chronic care management and care coordination services.     SDOH (Social Determinants of Health) assessments and interventions performed:    Advanced Directives Status: Not addressed in this encounter.  CCM Care Plan  No Known Allergies  Outpatient Encounter Medications as of 08/19/2021  Medication Sig   Accu-Chek Softclix Lancets lancets Use as instructed   acetaminophen (TYLENOL) 500 MG tablet Take 500 mg by mouth every 4 (four) hours as needed for mild pain or fever.    amLODipine (NORVASC) 5 MG tablet TAKE 1 TABLET (5 MG TOTAL) BY MOUTH DAILY.   ARIPiprazole (ABILIFY) 2 MG tablet Take 1 tablet (2 mg total) by mouth at bedtime.   atorvastatin (LIPITOR) 80 MG tablet Take 1 tablet (80 mg total) by mouth daily.   Cholecalciferol (VITAMIN D3) 25 MCG (1000 UT) CAPS Take 1,000 Units by mouth daily.    Chromium Picolinate 1000 MCG  TABS Take 1,000 mcg by mouth daily.    clopidogrel (PLAVIX) 75 MG tablet Take 75 mg by mouth daily.   Coenzyme Q10 (CO Q 10) 100 MG CAPS Take 100 mg by mouth daily.    Digestive Enzymes CAPS Take 2 capsules by mouth daily.    donepezil (ARICEPT) 5 MG tablet Take 5 mg by mouth at bedtime.    DULoxetine (CYMBALTA) 60 MG capsule Take 1 capsule (60 mg total) by mouth daily.   gabapentin (NEURONTIN) 300 MG capsule TAKE TWO CAPSULES BY MOUTH TWICE A DAY TAKE 3 CAPSULES AT BEDTIME   glucose blood (ONETOUCH VERIO) test strip Use to check blood sugar once a day Dx Code E11.21   hydrALAZINE (APRESOLINE) 25 MG tablet TAKE 1 TABLET (25 MG TOTAL) BY MOUTH 4 (FOUR) TIMES DAILY.   HYDROcodone-acetaminophen (NORCO) 7.5-325 MG tablet Take 1 tablet by mouth every 6 (six) hours as needed for moderate pain.   lisinopril-hydrochlorothiazide (ZESTORETIC) 20-25 MG tablet TAKE 2 TABLETS BY MOUTH EVERY DAY   Multiple Vitamins-Minerals (MULTIVITAMIN WITH MINERALS) tablet Take 1 tablet by mouth daily.    Omega-3 Fatty Acids (FISH OIL) 1000 MG CAPS Take 2,000 mg by mouth.   Soya Lecithin 1200 MG CAPS Take 1,200 mg by mouth 3 (three) times daily.    vitamin C (ASCORBIC ACID) 500 MG tablet Take 500 mg by mouth daily.   No facility-administered encounter medications on file as of 08/19/2021.    Patient Active Problem List   Diagnosis Date Noted   Candidal intertrigo 03/29/2021   History of stroke 07/04/2020  Chronic diastolic CHF (congestive heart failure) ([place]) 06/10/2020   Stage 3b chronic kidney disease ([place]) 06/10/2020   Type II diabetes mellitus with renal manifestations (HCC) 06/10/2020   Abnormal MRI    Shoulder tendonitis, left 07/25/2019   Chronic narcotic dependence ([place]) 07/04/2019   Osteoarthritis of right knee 11/16/2018   Urge incontinence 10/07/2018   Mycotic toenails 10/07/2018   Venous insufficiency 10/07/2018   Unilateral primary osteoarthritis, left hip 01/08/2018   Status post total  replacement of left hip 01/08/2018   Pain syndrome, chronic 03/13/2017   Facet arthropathy, lumbar 03/13/2017   MDD (major depressive disorder), single episode 05/07/2016   Advance directive discussed with patient 01/15/2016   Spinal stenosis of lumbar region with neurogenic claudication 11/07/2015   Other intervertebral disc degeneration, lumbar region 06/01/2015   Chronic back pain 03/29/2015   Preventative health care 01/10/2015   Osteoarthritis, hip, bilateral 11/28/2014   Peripheral neuropathy 11/28/2014   Type 2 diabetes, controlled, with neuropathy ([place]) 01/20/2014   Episodic mood disorder ([place]) 01/20/2014   MCI (mild cognitive impairment) 12/26/2013   IBS (irritable bowel syndrome) 12/27/2012   Essential hypertension, benign 08/06/2010    Conditions to be addressed/monitored: Depression; Mental Health Concerns   Care Plan : LCSW Plan of Care  Updates made by Deirdre Peer, LCSW since 08/19/2021 12:00 AM     Problem: Depression   Priority: High     Long-Range Goal: Reduce symptoms and better manage symptoms of depression   Start Date: 07/09/2021  Expected End Date: 10/07/2021  Recent Progress: On track  Priority: High  Note:   Current Barriers:  Severe Persistent Mental Health needs related to depression Limited social support, Transportation, Mental Health Concerns , and Social Isolation Suicidal Ideation/Homicidal Ideation: No  Clinical Social Work Goal(s):  patient will work with SW  as needed  by telephone to reduce or manage symptoms related to depression demonstrate a reduction in symptoms related to :Depression: depressed mood hopelessness disturbed sleep decreased appetite   Interventions: 08/19/21- Pt reports taking the increased dose of Cymbalta and thinks its helping. "I'm depressed because of my pain". Pt also shared that her PCP has started her on a pain med (Hydrocodone) and she is taking it every 6 hours. "I set an alarm for 1am so I can take  it....7, 1, 7 and 1am".  CSW encouraged pt to talk to her Provider about the frequency and if she needs to be setting alarm to wake up and take an "as needed" pain med.  CSW voiced concern for her becoming dependent on it and perhaps not a need to wake up and take it- if sleeping sound and not awakened by pain than may not need it; however, pt states "I'm addicted to pain so who cares if I get addicted to the pill".  CSW planned to reassess pt's depression today but pt declines and asks to do it next time.    11/15Pt admits to depression; signs of apathy and overall lack of purpose. Pt shared that her Cymbalta was just increased from 30 to 60mg  QD about one week ago.  CSW completed PHQ9 Depression screening with pt; scoring a "15" (moderately severe). Pt denies SI/HI- shared a lot of emotional strain she is having along with medical conditions. She wants to wait until follow up in 4-6 weeks to decide on counseling; hoping the RX dose increase will make a difference. Patient interviewed and appropriate assessments performed: PHQ 9 1:1 collaboration with Venia Carbon, MD regarding development  and update of comprehensive plan of care as evidenced by provider attestation and co-signature Patient interviewed and appropriate assessments performed Provided patient with information about counseling support which she wants to wait and see how the RX adjustment helps Discussed plans with patient for ongoing care management follow up and provided patient with direct contact information for care management team Depression screen reviewed  PHQ2/ PHQ9 completed Active listening / Reflection utilized  Emotional Support Provided Problem Solving [place] strategies reviewed Reviewed mental health medications with patient and discussed importance of compliance:   Patient Self Care Activities:  Self administers medications as prescribed Attends all scheduled provider appointments Calls pharmacy for  medication refills Performs ADL's independently Calls provider office for new concerns or questions Ability for insight Independent living Motivation for treatment  Patient Coping Strengths:  Supportive Relationships Family Friends Able to Communicate Effectively  Patient Self Care Deficits:  Lacks social connections  Patient Goals: - avoid negative self-talk - develop a Nurse, children's plan - develop a plan to deal with triggers like holidays, anniversaries - exercise at least 2 to 3 times per week - have a plan for how to handle bad days - journal feelings and what helps to feel better or worse - spend time or talk with others at least 2 to 3 times per week - spend time or talk with others every day Follow Up Plan: Appointment scheduled for SW follow up with client by phone on:  09/19/21      Follow Up Plan: Appointment scheduled for SW follow up with client by phone on: 09/19/21      Eduard Clos MSW, [place] Licensed Clinical Social Worker [place] [phone]

## 2021-08-19 NOTE — Patient Instructions (Signed)
Visit Information  Thank you for taking time to visit with me today. Please don't hesitate to contact me if I can be of assistance to you before our next scheduled telephone appointment.  Following are the goals we discussed today:  (Copy and paste patient goals from clinical care plan here)  Our next appointment is by telephone on 09/19/21 at 11:15  Please call the care guide team at [phone] if you need to cancel or reschedule your appointment.   If you are experiencing a Mental Health or [place] or need someone to talk to, please call the Suicide and Crisis Lifeline: 988 call the [place] National Suicide Prevention Lifeline: (424)579-5630 or TTY: 445-742-9326 TTY 562 115 5937) to talk to a trained counselor call 1-800-273-TALK (toll free, 24 hour hotline) call 911   Patient verbalizes understanding of instructions provided today and agrees to view in [place].   Eduard Clos MSW, LCSW Licensed Clinical Social Worker [place] [phone]

## 2021-08-21 ENCOUNTER — Other Ambulatory Visit: Payer: Self-pay | Admitting: Internal Medicine

## 2021-08-21 ENCOUNTER — Ambulatory Visit: Payer: Self-pay | Admitting: *Deleted

## 2021-08-21 DIAGNOSIS — G894 Chronic pain syndrome: Secondary | ICD-10-CM

## 2021-08-21 DIAGNOSIS — F324 Major depressive disorder, single episode, in partial remission: Secondary | ICD-10-CM

## 2021-08-21 DIAGNOSIS — E1140 Type 2 diabetes mellitus with diabetic neuropathy, unspecified: Secondary | ICD-10-CM

## 2021-08-21 NOTE — Chronic Care Management (AMB) (Signed)
Chronic Care Management    Clinical Social Work Note  08/21/2021 Name: [name] MRN: [id number] DOB: 11/11/1945  [name] is a 75 y.o. year old female who is a primary care patient of Letvak, Theophilus Kinds, MD. The CCM team was consulted to assist the patient with chronic disease management and/or care coordination needs related to: [employer]  and [name] and Resources.   Engaged with patient by telephone for  pt request/decline of further involvement  in response to provider referral for social work chronic care management and care coordination services.   Consent to Services:  The patient was given information about Chronic Care Management services, agreed to services, and gave verbal consent prior to initiation of services.  Please see initial visit note for detailed documentation.   Patient agreed to services and consent obtained.   Assessment: Review of patient past medical history, allergies, medications, and health status, including review of relevant consultants reports was performed today as part of a comprehensive evaluation and provision of chronic care management and care coordination services.     SDOH (Social Determinants of Health) assessments and interventions performed:    Advanced Directives Status: Not addressed in this encounter.  CCM Care Plan  No Known Allergies  Outpatient Encounter Medications as of 08/21/2021  Medication Sig   Accu-Chek Softclix Lancets lancets Use as instructed   acetaminophen (TYLENOL) 500 MG tablet Take 500 mg by mouth every 4 (four) hours as needed for mild pain or fever.    amLODipine (NORVASC) 5 MG tablet TAKE 1 TABLET (5 MG TOTAL) BY MOUTH DAILY.   ARIPiprazole (ABILIFY) 2 MG tablet Take 1 tablet (2 mg total) by mouth at bedtime.   atorvastatin (LIPITOR) 80 MG tablet Take 1 tablet (80 mg total) by mouth daily.   Cholecalciferol (VITAMIN D3) 25 MCG (1000 UT) CAPS Take 1,000 Units by mouth daily.     Chromium Picolinate 1000 MCG TABS Take 1,000 mcg by mouth daily.    clopidogrel (PLAVIX) 75 MG tablet Take 75 mg by mouth daily.   Coenzyme Q10 (CO Q 10) 100 MG CAPS Take 100 mg by mouth daily.    Digestive Enzymes CAPS Take 2 capsules by mouth daily.    donepezil (ARICEPT) 5 MG tablet Take 5 mg by mouth at bedtime.    DULoxetine (CYMBALTA) 60 MG capsule Take 1 capsule (60 mg total) by mouth daily.   gabapentin (NEURONTIN) 300 MG capsule TAKE TWO CAPSULES BY MOUTH TWICE A DAY TAKE 3 CAPSULES AT BEDTIME   glucose blood (ONETOUCH VERIO) test strip Use to check blood sugar once a day Dx Code E11.21   hydrALAZINE (APRESOLINE) 25 MG tablet TAKE 1 TABLET (25 MG TOTAL) BY MOUTH 4 (FOUR) TIMES DAILY.   HYDROcodone-acetaminophen (NORCO) 7.5-325 MG tablet Take 1 tablet by mouth every 6 (six) hours as needed for moderate pain.   lisinopril-hydrochlorothiazide (ZESTORETIC) 20-25 MG tablet TAKE 2 TABLETS BY MOUTH EVERY DAY   Multiple Vitamins-Minerals (MULTIVITAMIN WITH MINERALS) tablet Take 1 tablet by mouth daily.    Omega-3 Fatty Acids (FISH OIL) 1000 MG CAPS Take 2,000 mg by mouth.   Soya Lecithin 1200 MG CAPS Take 1,200 mg by mouth 3 (three) times daily.    vitamin C (ASCORBIC ACID) 500 MG tablet Take 500 mg by mouth daily.   No facility-administered encounter medications on file as of 08/21/2021.    Patient Active Problem List   Diagnosis Date Noted   Candidal intertrigo 03/29/2021   History  of stroke 07/04/2020   Chronic diastolic CHF (congestive heart failure) ([place]) 06/10/2020   Stage 3b chronic kidney disease ([place]) 06/10/2020   Type II diabetes mellitus with renal manifestations ([place]) 06/10/2020   Abnormal MRI    Shoulder tendonitis, left 07/25/2019   Chronic narcotic dependence ([place]) 07/04/2019   Osteoarthritis of right knee 11/16/2018   Urge incontinence 10/07/2018   Mycotic toenails 10/07/2018   Venous insufficiency 10/07/2018   Unilateral primary osteoarthritis, left hip 01/08/2018    Status post total replacement of left hip 01/08/2018   Pain syndrome, chronic 03/13/2017   Facet arthropathy, lumbar 03/13/2017   MDD (major depressive disorder), single episode 05/07/2016   Advance directive discussed with patient 01/15/2016   Spinal stenosis of lumbar region with neurogenic claudication 11/07/2015   Other intervertebral disc degeneration, lumbar region 06/01/2015   Chronic back pain 03/29/2015   Preventative health care 01/10/2015   Osteoarthritis, hip, bilateral 11/28/2014   Peripheral neuropathy 11/28/2014   Type 2 diabetes, controlled, with neuropathy ([place]) 01/20/2014   Episodic mood disorder ([place]) 01/20/2014   MCI (mild cognitive impairment) 12/26/2013   IBS (irritable bowel syndrome) 12/27/2012   Essential hypertension, benign 08/06/2010    Conditions to be addressed/monitored: Depression; Mental Health Concerns   There are no care plans that you recently modified to display for this patient.    Follow Up Plan: PCP and/or Client will reach out to CCM team if further interest/needs arise. Eduard Clos MSW, LCSW Licensed Clinical Social Worker [place] [phone]

## 2021-08-28 ENCOUNTER — Encounter: Payer: Self-pay | Admitting: Internal Medicine

## 2021-08-30 ENCOUNTER — Other Ambulatory Visit: Payer: Self-pay | Admitting: Internal Medicine

## 2021-09-01 ENCOUNTER — Encounter: Payer: Self-pay | Admitting: Internal Medicine

## 2021-09-01 ENCOUNTER — Emergency Department
Admission: EM | Admit: 2021-09-01 | Discharge: 2021-09-01 | Disposition: A | Discharge status: Home / Self Care | Payer: Medicare Other | Attending: Emergency Medicine | Admitting: Emergency Medicine

## 2021-09-01 ENCOUNTER — Other Ambulatory Visit: Payer: Self-pay | Admitting: Internal Medicine

## 2021-09-01 ENCOUNTER — Emergency Department: Payer: Medicare Other

## 2021-09-01 ENCOUNTER — Encounter: Payer: Self-pay | Admitting: Emergency Medicine

## 2021-09-01 ENCOUNTER — Other Ambulatory Visit: Payer: Self-pay

## 2021-09-01 DIAGNOSIS — E1140 Type 2 diabetes mellitus with diabetic neuropathy, unspecified: Secondary | ICD-10-CM | POA: Diagnosis not present

## 2021-09-01 DIAGNOSIS — N1832 Chronic kidney disease, stage 3b: Secondary | ICD-10-CM | POA: Diagnosis not present

## 2021-09-01 DIAGNOSIS — I130 Hypertensive heart and chronic kidney disease with heart failure and stage 1 through stage 4 chronic kidney disease, or unspecified chronic kidney disease: Secondary | ICD-10-CM | POA: Insufficient documentation

## 2021-09-01 DIAGNOSIS — Z96642 Presence of left artificial hip joint: Secondary | ICD-10-CM | POA: Diagnosis not present

## 2021-09-01 DIAGNOSIS — Z79899 Other long term (current) drug therapy: Secondary | ICD-10-CM | POA: Insufficient documentation

## 2021-09-01 DIAGNOSIS — I5032 Chronic diastolic (congestive) heart failure: Secondary | ICD-10-CM | POA: Insufficient documentation

## 2021-09-01 DIAGNOSIS — R600 Localized edema: Secondary | ICD-10-CM | POA: Diagnosis not present

## 2021-09-01 DIAGNOSIS — Y9389 Activity, other specified: Secondary | ICD-10-CM | POA: Diagnosis not present

## 2021-09-01 DIAGNOSIS — E1122 Type 2 diabetes mellitus with diabetic chronic kidney disease: Secondary | ICD-10-CM | POA: Insufficient documentation

## 2021-09-01 DIAGNOSIS — M7022 Olecranon bursitis, left elbow: Secondary | ICD-10-CM | POA: Diagnosis not present

## 2021-09-01 DIAGNOSIS — M7989 Other specified soft tissue disorders: Secondary | ICD-10-CM | POA: Diagnosis not present

## 2021-09-01 NOTE — ED Provider Notes (Signed)
[facility] Emergency Department Provider Note  ____________________________________________  Time seen: Approximately 2:16 PM  I have reviewed the triage vital signs and the nursing notes.   HISTORY  Chief Complaint Arm Pain    HPI [name] is a 75 y.o. female with a past history of hypertension diabetes who comes ED complaining of left elbow swelling.  Denies any pain currently.  States that yesterday she was having trouble getting up off her couch and so she used her left elbow to push into the couch and try and lift herself up.  Within a few hours, she started having swelling on the extensor surface of the left elbow, and has developed some bruising.  Denies pain.  No pain with range of motion of the joint.  No loss of sensation or strength.  No other injury.  Symptoms are constant, gradual onset.    Past Medical History:  Diagnosis Date   Arthritis    Confusion    Diabetes mellitus without complication ([place])    type 2   Hepatitis    pt. denies   Hypertension    Lumbar stenosis    Memory loss    Nocturia    Numbness and tingling    legs and feet   Wears glasses      Patient Active Problem List   Diagnosis Date Noted   Candidal intertrigo 03/29/2021   History of stroke 07/04/2020   Chronic diastolic CHF (congestive heart failure) ([place]) 06/10/2020   Stage 3b chronic kidney disease ([place]) 06/10/2020   Type II diabetes mellitus with renal manifestations ([place]) 06/10/2020   Abnormal MRI    Shoulder tendonitis, left 07/25/2019   Chronic narcotic dependence ([place]) 07/04/2019   Osteoarthritis of right knee 11/16/2018   Urge incontinence 10/07/2018   Mycotic toenails 10/07/2018   Venous insufficiency 10/07/2018   Unilateral primary osteoarthritis, left hip 01/08/2018   Status post total replacement of left hip 01/08/2018   Pain syndrome, chronic 03/13/2017   Facet arthropathy, lumbar 03/13/2017   MDD (major depressive disorder), single  episode 05/07/2016   Advance directive discussed with patient 01/15/2016   Spinal stenosis of lumbar region with neurogenic claudication 11/07/2015   Other intervertebral disc degeneration, lumbar region 06/01/2015   Chronic back pain 03/29/2015   Preventative health care 01/10/2015   Osteoarthritis, hip, bilateral 11/28/2014   Peripheral neuropathy 11/28/2014   Type 2 diabetes, controlled, with neuropathy ([place]) 01/20/2014   Episodic mood disorder ([place]) 01/20/2014   MCI (mild cognitive impairment) 12/26/2013   IBS (irritable bowel syndrome) 12/27/2012   Essential hypertension, benign 08/06/2010     Past Surgical History:  Procedure Laterality Date   CESAREAN SECTION  1978   COLONOSCOPY     HEMORRHOID SURGERY  2015   JOINT REPLACEMENT     01-08-18 Dr. Kathrynn Speed   TOTAL HIP ARTHROPLASTY Left 01/08/2018   Procedure: LEFT TOTAL HIP ARTHROPLASTY ANTERIOR APPROACH;  Surgeon: Mcarthur Rossetti, MD;  Location: [facility];  Service: Orthopedics;  Laterality: Left;   TRANSFORAMINAL LUMBAR INTERBODY FUSION (TLIF) WITH PEDICLE SCREW FIXATION 2 LEVEL N/A 11/07/2015   Procedure: Lumbar three-four Lumbar four-five  transforaminal lumbar interbody fusion with interbody prosthesis posterior lateral arthrodesis and posterior segmental instrumentation;  Surgeon: Newman Pies, MD;  Location: [facility];  Service: Neurosurgery;  Laterality: N/A;     Prior to Admission medications   Medication Sig Start Date End Date Taking? Authorizing Provider  Accu-Chek Softclix Lancets lancets Use as instructed 02/28/20   Silvio Pate,  Theophilus Kinds, MD  acetaminophen (TYLENOL) 500 MG tablet Take 500 mg by mouth every 4 (four) hours as needed for mild pain or fever.     [provider]  amLODipine (NORVASC) 5 MG tablet TAKE 1 TABLET (5 MG TOTAL) BY MOUTH DAILY. 12/24/20   Venia Carbon, MD  ARIPiprazole (ABILIFY) 2 MG tablet Take 1 tablet (2 mg total) by mouth at bedtime. 07/30/21   Venia Carbon, MD   atorvastatin (LIPITOR) 80 MG tablet Take 1 tablet (80 mg total) by mouth daily. 06/11/20 09/09/20  Enzo Bi, MD  Cholecalciferol (VITAMIN D3) 25 MCG (1000 UT) CAPS Take 1,000 Units by mouth daily.     [provider]  Chromium Picolinate 1000 MCG TABS Take 1,000 mcg by mouth daily.     [provider]  clopidogrel (PLAVIX) 75 MG tablet Take 75 mg by mouth daily. 05/20/21   [provider]  Coenzyme Q10 (CO Q 10) 100 MG CAPS Take 100 mg by mouth daily.     [provider]  Digestive Enzymes CAPS Take 2 capsules by mouth daily.     [provider]  donepezil (ARICEPT) 5 MG tablet Take 5 mg by mouth at bedtime.  07/20/20 11/26/20  [provider]  DULoxetine (CYMBALTA) 60 MG capsule TAKE 1 CAPSULE BY MOUTH EVERY DAY 08/30/21   Viviana Simpler I, MD  gabapentin (NEURONTIN) 300 MG capsule TAKE TWO CAPSULES BY MOUTH TWICE A DAY TAKE 3 CAPSULES AT BEDTIME 07/18/21   Viviana Simpler I, MD  glucose blood (ONETOUCH VERIO) test strip Use to check blood sugar once a day Dx Code E11.21 05/10/21   Venia Carbon, MD  hydrALAZINE (APRESOLINE) 25 MG tablet TAKE 1 TABLET (25 MG TOTAL) BY MOUTH 4 (FOUR) TIMES DAILY. 07/01/21   Venia Carbon, MD  HYDROcodone-acetaminophen (NORCO) 7.5-325 MG tablet Take 1 tablet by mouth every 6 (six) hours as needed for moderate pain. 08/08/21   Venia Carbon, MD  lisinopril-hydrochlorothiazide (ZESTORETIC) 20-25 MG tablet TAKE 2 TABLETS BY MOUTH EVERY DAY 07/01/21   Venia Carbon, MD  Multiple Vitamins-Minerals (MULTIVITAMIN WITH MINERALS) tablet Take 1 tablet by mouth daily.     [provider]  Omega-3 Fatty Acids (FISH OIL) 1000 MG CAPS Take 2,000 mg by mouth.    [provider]  Soya Lecithin 1200 MG CAPS Take 1,200 mg by mouth 3 (three) times daily.     [provider]  vitamin C (ASCORBIC ACID) 500 MG tablet Take 500 mg by mouth daily.    [provider]     Allergies Patient  has no known allergies.   Family History  Problem Relation Age of Onset   Hypertension Other    Cancer Other        ovarian   Cancer Mother    Heart disease Father     Social History Social History   Tobacco Use   Smoking status: Never   Smokeless tobacco: Never  Vaping Use   Vaping Use: Never used  Substance Use Topics   Alcohol use: No    Comment: rarely   Drug use: No    Review of Systems  Constitutional:   No fever or chills.  ENT:   No sore throat. No rhinorrhea. Cardiovascular:   No chest pain or syncope. Respiratory:   No dyspnea or cough. Gastrointestinal:   Negative for abdominal pain, vomiting and diarrhea.  Musculoskeletal: Left elbow swelling as above All other systems reviewed and  are negative except as documented above in ROS and HPI.  ____________________________________________   PHYSICAL EXAM:  VITAL SIGNS: ED Triage Vitals  Enc Vitals Group     BP 09/01/21 1254 (!) 130/91     Pulse Rate 09/01/21 1254 (!) 108     Resp 09/01/21 1254 18     Temp 09/01/21 1254 97.8 F (36.6 C)     Temp Source 09/01/21 1254 Oral     SpO2 09/01/21 1254 96 %     Weight 09/01/21 1258 172 lb (78 kg)     Height 09/01/21 1258 4\' 11"  (1.499 m)     Head Circumference --      Peak Flow --      Pain Score 09/01/21 1257 1     Pain Loc --      Pain Edu? --      Excl. in [place]? --     Vital signs reviewed, nursing assessments reviewed.   Constitutional:   Alert and oriented. Non-toxic appearance. Eyes:   Conjunctivae are normal. EOMI. ENT      Head:   Normocephalic and atraumatic.           Neck:   No meningismus. Full ROM. Hematological/Lymphatic/Immunilogical:   No cervical lymphadenopathy. Cardiovascular:   RRR.  Cap refill less than 2 seconds. Respiratory:   Normal respiratory effort without tachypnea/retractions. Musculoskeletal: There is olecranon bursitis.  Fluid collection is not tender or tense.  No crepitus.  Painless range of motion of the left elbow, no  joint line tenderness, no bony point tenderness.  Normal range of motion in all extremities.   Neurologic:   Normal speech and language.  Motor grossly intact. No acute focal neurologic deficits are appreciated.  Skin:    Skin is warm, dry and intact. No rash noted.  No wounds.  ____________________________________________    LABS (pertinent positives/negatives) (all labs ordered are listed, but only abnormal results are displayed) Labs Reviewed - No data to display ____________________________________________   EKG  ____________________________________________    RADIOLOGY  DG Elbow Complete Left  Result Date: 09/01/2021 CLINICAL DATA:  swelling and pain EXAM: LEFT ELBOW - COMPLETE 3+ VIEW COMPARISON:  None. FINDINGS: No acute fracture or dislocation. Mild degenerative changes of the ulnohumeral joint. Mild enthesopathic changes of the radial head. No area of erosion or osseous destruction. No unexpected radiopaque foreign body. Marked soft tissue edema overlying the olecranon process. IMPRESSION: No acute osseous abnormality. Marked soft tissue edema overlying the olecranon process likely reflects olecranon bursitis. Electronically Signed   By: Valentino Saxon M.D.   On: 09/01/2021 13:36    ____________________________________________   PROCEDURES Procedures  ____________________________________________  CLINICAL IMPRESSION / ASSESSMENT AND PLAN / ED COURSE  Pertinent labs & imaging results that were available during my care of the patient were reviewed by me and considered in my medical decision making (see chart for details).  [name] was evaluated in Emergency Department on 09/01/2021 for the symptoms described in the history of present illness. She was evaluated in the context of the global COVID-19 pandemic, which necessitated consideration that the patient might be at risk for infection with the SARS-CoV-2 virus that causes COVID-19. Institutional protocols  and algorithms that pertain to the evaluation of patients at risk for COVID-19 are in a state of rapid change based on information released by regulatory bodies including the CDC and federal and state organizations. These policies and algorithms were followed during the patient's care in the ED.   Patient  presents with clinically apparent olecranon bursitis, supported by the history that she offers.  No evidence of fracture, osteomyelitis, soft tissue infection, necrotizing fasciitis, or abscess.  I do not think the bursa is infected.  Routine supportive care with Ace wrap, NSAIDs, follow-up with orthopedics in a week.      ____________________________________________   FINAL CLINICAL IMPRESSION(S) / ED DIAGNOSES    Final diagnoses:  Olecranon bursitis of left elbow     ED Discharge Orders     None       Portions of this note were generated with dragon dictation software. Dictation errors may occur despite best attempts at proofreading.   Carrie Mew, MD 09/01/21 1420

## 2021-09-01 NOTE — ED Triage Notes (Signed)
Pt via POV c/o left elbow pain and swelling since yesterday. She denies falling or acute injury but says that her elbow is painful enough that she is unable to use her left arm as well as usual. Pain currently rated as 1/10 but the swelling is concerning which is why she is here for eval. On assessment elbow is grossly swollen with what feels like fluid accumulation and some bruising. Area is slightly warmer than surrounding skin and is discolored. Pt denies pain with palpation.

## 2021-09-01 NOTE — ED Notes (Signed)
Pt called on call bell because she "feels trapped" in bed and asking how long ED process will take. Pt requested to sit on chair. Pt in chair. Explained staff will get to her as soon as can and ace wrap will be applied to arm.

## 2021-09-02 ENCOUNTER — Other Ambulatory Visit: Payer: Self-pay | Admitting: Internal Medicine

## 2021-09-02 NOTE — Telephone Encounter (Signed)
Refill request for HYDROcodone-acetaminophen (NORCO) 7.5-325 MG tablet  LOV - 08/19/21 Next OV - not scheduled Last refill - 08/08/21 #90/0

## 2021-09-03 ENCOUNTER — Telehealth: Payer: Self-pay | Admitting: Nurse Practitioner

## 2021-09-03 ENCOUNTER — Other Ambulatory Visit: Payer: Self-pay | Admitting: Internal Medicine

## 2021-09-03 MED ORDER — HYDROCODONE-ACETAMINOPHEN 7.5-325 MG PO TABS: 1.0000 | ORAL_TABLET | Freq: Four times a day (QID) | ORAL | 0 refills | Status: DC | PRN

## 2021-09-03 NOTE — Telephone Encounter (Signed)
1.Medication Requested: HYDROcodone-acetaminophen (NORCO) 7.5-325 MG tablet  2. Pharmacy (Name, [place]):  [id number] [place], [address] Phone:  [phone]  Fax:  [phone]      3. On Med List: y  31. Last Visit with PCP: 10.20.22  5. Next visit date with PCP: not scheduled yet   Patient is out of medication and needs pills today

## 2021-09-03 NOTE — Telephone Encounter (Signed)
Patient called and informed that she has medication at the pharmacy for her to pick up, patient stated understanding and appreciation for completing this request. Patient stated understanding and appreciation.

## 2021-09-03 NOTE — Telephone Encounter (Signed)
I refilled patients medication for a week until Dr. Silvio Pate is in office. He had a video follow up with her and was unable to connect. She will need a follow up with Dr. Silvio Pate also

## 2021-09-03 NOTE — Telephone Encounter (Signed)
Last OV - 06/27/2021 Next OV - N/A Last Filled -  08/08/2021

## 2021-09-04 ENCOUNTER — Encounter: Payer: Self-pay | Admitting: Internal Medicine

## 2021-09-07 ENCOUNTER — Encounter: Payer: Self-pay | Admitting: Internal Medicine

## 2021-09-07 DIAGNOSIS — F324 Major depressive disorder, single episode, in partial remission: Secondary | ICD-10-CM | POA: Diagnosis not present

## 2021-09-07 DIAGNOSIS — E1140 Type 2 diabetes mellitus with diabetic neuropathy, unspecified: Secondary | ICD-10-CM | POA: Diagnosis not present

## 2021-09-07 DIAGNOSIS — F322 Major depressive disorder, single episode, severe without psychotic features: Secondary | ICD-10-CM | POA: Diagnosis not present

## 2021-09-11 ENCOUNTER — Encounter: Payer: Self-pay | Admitting: Internal Medicine

## 2021-09-11 MED ORDER — HYDROCODONE-ACETAMINOPHEN 7.5-325 MG PO TABS: 1.0000 | ORAL_TABLET | Freq: Three times a day (TID) | ORAL | 0 refills | Status: DC | PRN

## 2021-09-11 NOTE — Telephone Encounter (Signed)
Pt called in requesting a call back regarding RX refill of HYDOcodone - acetaminophen . RX is no longer on pt med list . Please advise [phone]

## 2021-09-18 ENCOUNTER — Other Ambulatory Visit: Payer: Self-pay

## 2021-09-18 ENCOUNTER — Ambulatory Visit (INDEPENDENT_AMBULATORY_CARE_PROVIDER_SITE_OTHER): Payer: Commercial Managed Care - HMO | Admitting: Internal Medicine

## 2021-09-18 ENCOUNTER — Ambulatory Visit: Payer: Medicare Other | Admitting: Internal Medicine

## 2021-09-18 ENCOUNTER — Encounter: Payer: Self-pay | Admitting: Internal Medicine

## 2021-09-18 VITALS — BP 110/70 | HR 100 | Temp 97.1°F | Ht 59.0 in | Wt 178.0 lb

## 2021-09-18 DIAGNOSIS — F1120 Opioid dependence, uncomplicated: Secondary | ICD-10-CM

## 2021-09-18 DIAGNOSIS — M549 Dorsalgia, unspecified: Secondary | ICD-10-CM | POA: Diagnosis not present

## 2021-09-18 DIAGNOSIS — F320 Major depressive disorder, single episode, mild: Secondary | ICD-10-CM

## 2021-09-18 DIAGNOSIS — M7022 Olecranon bursitis, left elbow: Secondary | ICD-10-CM | POA: Diagnosis not present

## 2021-09-18 DIAGNOSIS — M7032 Other bursitis of elbow, left elbow: Secondary | ICD-10-CM | POA: Insufficient documentation

## 2021-09-18 DIAGNOSIS — G894 Chronic pain syndrome: Secondary | ICD-10-CM

## 2021-09-18 DIAGNOSIS — G8929 Other chronic pain: Secondary | ICD-10-CM

## 2021-09-18 MED ORDER — HYDROCODONE-ACETAMINOPHEN 7.5-325 MG PO TABS: 1.0000 | ORAL_TABLET | Freq: Four times a day (QID) | ORAL | 0 refills | Status: DC | PRN

## 2021-09-18 NOTE — Assessment & Plan Note (Signed)
PDMP reviewed No surprises

## 2021-09-18 NOTE — Assessment & Plan Note (Signed)
Still very histrionic Relates her depression as being mostly pain related Seems somewhat manipulative but hard to tell Will continue the duloxetine 60mg  and abilify 2mg 

## 2021-09-18 NOTE — Progress Notes (Signed)
Subjective:    Patient ID: [name], female    DOB: 07-28-1946, 76 y.o.   MRN: [id number]  HPI Here to review her chronic pain and narcotic dependence  Inadequate pain control on the tramadol (maximal dose) Changed to the norco---but she feels it is not enough even with 4 per day Still has persistent back pain despite the 4 times a day  Hard to straighten up Very frustrated by needing the walker  Also with ongoing "lump" on left elbow Seen in ER then Told bursitis--but no Rx  Current Outpatient Medications on File Prior to Visit  Medication Sig Dispense Refill   Accu-Chek Softclix Lancets lancets Use as instructed 100 each 3   amLODipine (NORVASC) 5 MG tablet TAKE 1 TABLET (5 MG TOTAL) BY MOUTH DAILY. 90 tablet 3   ARIPiprazole (ABILIFY) 2 MG tablet Take 1 tablet (2 mg total) by mouth at bedtime. 30 tablet 1   Cholecalciferol (VITAMIN D3) 25 MCG (1000 UT) CAPS Take 1,000 Units by mouth daily.      Chromium Picolinate 1000 MCG TABS Take 1,000 mcg by mouth daily.      clopidogrel (PLAVIX) 75 MG tablet Take 75 mg by mouth daily.     Coenzyme Q10 (CO Q 10) 100 MG CAPS Take 100 mg by mouth daily.      Digestive Enzymes CAPS Take 2 capsules by mouth daily.      DULoxetine (CYMBALTA) 60 MG capsule TAKE 1 CAPSULE BY MOUTH EVERY DAY 90 capsule 3   gabapentin (NEURONTIN) 300 MG capsule TAKE TWO CAPSULES BY MOUTH TWICE A DAY TAKE 3 CAPSULES AT BEDTIME 630 capsule 4   glucose blood (ONETOUCH VERIO) test strip Use to check blood sugar once a day Dx Code E11.21 100 strip 12   hydrALAZINE (APRESOLINE) 25 MG tablet TAKE 1 TABLET (25 MG TOTAL) BY MOUTH 4 (FOUR) TIMES DAILY. 360 tablet 3   HYDROcodone-acetaminophen (NORCO) 7.5-325 MG tablet Take 1 tablet by mouth 3 (three) times daily as needed for moderate pain. 30 tablet 0   lisinopril-hydrochlorothiazide (ZESTORETIC) 20-25 MG tablet TAKE 2 TABLETS BY MOUTH EVERY DAY 180 tablet 3   Multiple Vitamins-Minerals (MULTIVITAMIN WITH MINERALS)  tablet Take 1 tablet by mouth daily.      Omega-3 Fatty Acids (FISH OIL) 1000 MG CAPS Take 2,000 mg by mouth.     Soya Lecithin 1200 MG CAPS Take 1,200 mg by mouth 3 (three) times daily.      vitamin C (ASCORBIC ACID) 500 MG tablet Take 500 mg by mouth daily.     atorvastatin (LIPITOR) 80 MG tablet Take 1 tablet (80 mg total) by mouth daily. 30 tablet 2   donepezil (ARICEPT) 5 MG tablet Take 5 mg by mouth at bedtime.      No current facility-administered medications on file prior to visit.    No Known Allergies  Past Medical History:  Diagnosis Date   Arthritis    Confusion    Diabetes mellitus without complication ([place])    type 2   Hepatitis    pt. denies   Hypertension    Lumbar stenosis    Memory loss    Nocturia    Numbness and tingling    legs and feet   Wears glasses     Past Surgical History:  Procedure Laterality Date   CESAREAN SECTION  1978   COLONOSCOPY     HEMORRHOID SURGERY  2015   JOINT REPLACEMENT     01-08-18 Dr. Kathrynn Speed  TOTAL HIP ARTHROPLASTY Left 01/08/2018   Procedure: LEFT TOTAL HIP ARTHROPLASTY ANTERIOR APPROACH;  Surgeon: Mcarthur Rossetti, MD;  Location: [facility];  Service: Orthopedics;  Laterality: Left;   TRANSFORAMINAL LUMBAR INTERBODY FUSION (TLIF) WITH PEDICLE SCREW FIXATION 2 LEVEL N/A 11/07/2015   Procedure: Lumbar three-four Lumbar four-five  transforaminal lumbar interbody fusion with interbody prosthesis posterior lateral arthrodesis and posterior segmental instrumentation;  Surgeon: Newman Pies, MD;  Location: [facility];  Service: Neurosurgery;  Laterality: N/A;    Family History  Problem Relation Age of Onset   Hypertension Other    Cancer Other        ovarian   Cancer Mother    Heart disease Father     Social History   Socioeconomic History   Marital status: Divorced    Spouse name: Not on file   Number of children: 4   Years of education: Not on file   Highest education level: Associate degree: occupational,  Hotel manager, or vocational program  Occupational History   Not on file  Tobacco Use   Smoking status: Never   Smokeless tobacco: Never  Vaping Use   Vaping Use: Never used  Substance and Sexual Activity   Alcohol use: No    Comment: rarely   Drug use: No   Sexual activity: Not Currently  Other Topics Concern   Not on file  Social History Narrative   Has a living will.   Son is health care POA   Would accept resuscitation.     Would accept life support    No tube feeds if cognitively unaware   Social Determinants of Health   Financial Resource Strain: Not on file  Food Insecurity: Not on file  Transportation Needs: Not on file  Physical Activity: Not on file  Stress: Not on file  Social Connections: Not on file  Intimate Partner Violence: Not on file   Review of Systems Does sleep okay---but some trouble re-initiating after nocturia Appetite is okay Gets some "tingling fire" up left leg     Objective:   Physical Exam Constitutional:      Appearance: Normal appearance.  Musculoskeletal:     Comments: Puffy bursa left elbow---red/slightly warm but not really tender No open areas  Neurological:     Mental Status: She is alert.  Psychiatric:     Comments: Starts crying and histrionic when I tell her that I can't prescribe as much narcotics as she seems to want "I don't want to have to use this thing (walker) all my life"           Assessment & Plan:

## 2021-09-18 NOTE — Assessment & Plan Note (Signed)
Doesn't seem infected If it worsens will start cephalexin 500mg  tid  Ice and compression/padding recommended

## 2021-09-18 NOTE — Assessment & Plan Note (Signed)
Her pain seems to be out of proportion to her last imaging a few years ago (which showed decompression after surgery and no other worrisome findings). She does have increased radicular symptoms in her left leg--so may need new imaging I will get her back in with Dr Arnoldo Morale who did her past surgery  For now, will continue the norco up to 4 times a day---but no more!

## 2021-09-18 NOTE — Assessment & Plan Note (Signed)
May need to consider referral to pain clinic or physiatry

## 2021-09-19 ENCOUNTER — Other Ambulatory Visit: Payer: Self-pay | Admitting: Internal Medicine

## 2021-09-25 ENCOUNTER — Encounter: Payer: Self-pay | Admitting: *Deleted

## 2021-09-30 ENCOUNTER — Encounter: Payer: Self-pay | Admitting: Internal Medicine

## 2021-10-01 DIAGNOSIS — M5416 Radiculopathy, lumbar region: Secondary | ICD-10-CM | POA: Diagnosis not present

## 2021-10-01 DIAGNOSIS — M545 Low back pain, unspecified: Secondary | ICD-10-CM | POA: Diagnosis not present

## 2021-10-01 DIAGNOSIS — R69 Illness, unspecified: Secondary | ICD-10-CM | POA: Diagnosis not present

## 2021-10-04 ENCOUNTER — Telehealth: Payer: Self-pay

## 2021-10-04 ENCOUNTER — Other Ambulatory Visit: Payer: Self-pay | Admitting: Student

## 2021-10-04 DIAGNOSIS — M5416 Radiculopathy, lumbar region: Secondary | ICD-10-CM

## 2021-10-04 MED ORDER — ATORVASTATIN CALCIUM 80 MG PO TABS: 80.0000 mg | ORAL_TABLET | Freq: Every day | ORAL | 3 refills | Status: DC

## 2021-10-04 MED ORDER — AMLODIPINE BESYLATE 5 MG PO TABS: 5.0000 mg | ORAL_TABLET | Freq: Every day | ORAL | 3 refills | Status: DC

## 2021-10-04 NOTE — Telephone Encounter (Signed)
Rx sent electronically.  

## 2021-10-08 DIAGNOSIS — E1122 Type 2 diabetes mellitus with diabetic chronic kidney disease: Secondary | ICD-10-CM | POA: Diagnosis not present

## 2021-10-08 DIAGNOSIS — I129 Hypertensive chronic kidney disease with stage 1 through stage 4 chronic kidney disease, or unspecified chronic kidney disease: Secondary | ICD-10-CM | POA: Diagnosis not present

## 2021-10-08 DIAGNOSIS — R809 Proteinuria, unspecified: Secondary | ICD-10-CM | POA: Diagnosis not present

## 2021-10-08 DIAGNOSIS — R69 Illness, unspecified: Secondary | ICD-10-CM | POA: Diagnosis not present

## 2021-10-08 DIAGNOSIS — N1831 Chronic kidney disease, stage 3a: Secondary | ICD-10-CM | POA: Diagnosis not present

## 2021-10-12 ENCOUNTER — Ambulatory Visit
Admission: RE | Admit: 2021-10-12 | Discharge: 2021-10-12 | Disposition: A | Discharge status: Home / Self Care | Payer: Medicare Other | Source: Ambulatory Visit | Attending: Student | Admitting: Student

## 2021-10-12 DIAGNOSIS — M5416 Radiculopathy, lumbar region: Secondary | ICD-10-CM | POA: Diagnosis not present

## 2021-10-12 DIAGNOSIS — M4126 Other idiopathic scoliosis, lumbar region: Secondary | ICD-10-CM | POA: Diagnosis not present

## 2021-10-12 DIAGNOSIS — M2578 Osteophyte, vertebrae: Secondary | ICD-10-CM | POA: Diagnosis not present

## 2021-10-12 MED ORDER — GADOBUTROL 1 MMOL/ML IV SOLN
7.5000 mL | Freq: Once | INTRAVENOUS | Status: AC | PRN
  Administered 2021-10-12: 7.5 mL via INTRAVENOUS

## 2021-10-15 ENCOUNTER — Other Ambulatory Visit: Payer: Self-pay | Admitting: Internal Medicine

## 2021-10-15 MED ORDER — HYDROCODONE-ACETAMINOPHEN 7.5-325 MG PO TABS: 1.0000 | ORAL_TABLET | Freq: Four times a day (QID) | ORAL | 0 refills | Status: DC | PRN

## 2021-10-15 NOTE — Telephone Encounter (Signed)
Name of Medication: Hydrocodone Name of [facility]acy: [facility] or Written Date and Quantity: 09-18-21 #[facility] Last Office Visit and Type: 09-18-21 Next Office Visit and Type: 10-23-21 Last Controlled Substance Agreement Date: N/A Last UDS: N/A

## 2021-10-16 ENCOUNTER — Ambulatory Visit: Payer: Medicaid Other | Admitting: Internal Medicine

## 2021-10-21 ENCOUNTER — Ambulatory Visit: Payer: Medicaid Other | Admitting: Internal Medicine

## 2021-10-23 ENCOUNTER — Other Ambulatory Visit: Payer: Self-pay

## 2021-10-23 ENCOUNTER — Ambulatory Visit (INDEPENDENT_AMBULATORY_CARE_PROVIDER_SITE_OTHER): Payer: Medicare Other | Admitting: Internal Medicine

## 2021-10-23 ENCOUNTER — Encounter: Payer: Self-pay | Admitting: Internal Medicine

## 2021-10-23 VITALS — BP 108/74 | HR 72 | Temp 96.9°F | Ht 59.0 in | Wt 179.0 lb

## 2021-10-23 DIAGNOSIS — M48062 Spinal stenosis, lumbar region with neurogenic claudication: Secondary | ICD-10-CM | POA: Diagnosis not present

## 2021-10-23 DIAGNOSIS — G894 Chronic pain syndrome: Secondary | ICD-10-CM | POA: Diagnosis not present

## 2021-10-23 NOTE — Progress Notes (Signed)
Subjective:    Patient ID: [name], female    DOB: April 02, 1946, 76 y.o.   MRN: [id number]  HPI Here for follow up of chronic pain and depression  Did go back to Dr Arnoldo Morale MRI done--no follow up since then Has ongoing pain---needs the walker still Tired of having to walk hunched over also  She feels ready to go to a pain doctor  Current Outpatient Medications on File Prior to Visit  Medication Sig Dispense Refill   Accu-Chek Softclix Lancets lancets Use as instructed 100 each 3   amLODipine (NORVASC) 5 MG tablet Take 1 tablet (5 mg total) by mouth daily. 90 tablet 3   ARIPiprazole (ABILIFY) 2 MG tablet TAKE 1 TABLET BY MOUTH AT BEDTIME. 90 tablet 3   atorvastatin (LIPITOR) 80 MG tablet Take 1 tablet (80 mg total) by mouth daily. 30 tablet 3   Cholecalciferol (VITAMIN D3) 25 MCG (1000 UT) CAPS Take 1,000 Units by mouth daily.      Chromium Picolinate 1000 MCG TABS Take 1,000 mcg by mouth daily.      clopidogrel (PLAVIX) 75 MG tablet Take 75 mg by mouth daily.     Coenzyme Q10 (CO Q 10) 100 MG CAPS Take 100 mg by mouth daily.      DULoxetine (CYMBALTA) 60 MG capsule TAKE 1 CAPSULE BY MOUTH EVERY DAY 90 capsule 3   gabapentin (NEURONTIN) 300 MG capsule TAKE TWO CAPSULES BY MOUTH TWICE A DAY TAKE 3 CAPSULES AT BEDTIME 630 capsule 4   glucose blood (ONETOUCH VERIO) test strip Use to check blood sugar once a day Dx Code E11.21 100 strip 12   hydrALAZINE (APRESOLINE) 25 MG tablet TAKE 1 TABLET (25 MG TOTAL) BY MOUTH 4 (FOUR) TIMES DAILY. (Patient taking differently: Take 25 mg by mouth 4 (four) times daily. Pt states she is taking 2 a day) 360 tablet 3   HYDROcodone-acetaminophen (NORCO) 7.5-325 MG tablet Take 1 tablet by mouth every 6 (six) hours as needed for moderate pain. 120 tablet 0   lisinopril-hydrochlorothiazide (ZESTORETIC) 20-25 MG tablet TAKE 2 TABLETS BY MOUTH EVERY DAY 180 tablet 3   Multiple Vitamins-Minerals (MULTIVITAMIN WITH MINERALS) tablet Take 1 tablet by mouth  daily.      Omega-3 Fatty Acids (FISH OIL) 1000 MG CAPS Take 2,000 mg by mouth.     Soya Lecithin 1200 MG CAPS Take 1,200 mg by mouth 3 (three) times daily.      vitamin C (ASCORBIC ACID) 500 MG tablet Take 500 mg by mouth daily.     donepezil (ARICEPT) 5 MG tablet Take 5 mg by mouth at bedtime.      No current facility-administered medications on file prior to visit.    No Known Allergies  Past Medical History:  Diagnosis Date   Arthritis    Confusion    Diabetes mellitus without complication ([place])    type 2   Hepatitis    pt. denies   Hypertension    Lumbar stenosis    Memory loss    Nocturia    Numbness and tingling    legs and feet   Wears glasses     Past Surgical History:  Procedure Laterality Date   CESAREAN SECTION  1978   COLONOSCOPY     HEMORRHOID SURGERY  2015   JOINT REPLACEMENT     01-08-18 Dr. Kathrynn Speed   TOTAL HIP ARTHROPLASTY Left 01/08/2018   Procedure: LEFT TOTAL HIP ARTHROPLASTY ANTERIOR APPROACH;  Surgeon: Mcarthur Rossetti, MD;  Location: [facility];  Service: Orthopedics;  Laterality: Left;   TRANSFORAMINAL LUMBAR INTERBODY FUSION (TLIF) WITH PEDICLE SCREW FIXATION 2 LEVEL N/A 11/07/2015   Procedure: Lumbar three-four Lumbar four-five  transforaminal lumbar interbody fusion with interbody prosthesis posterior lateral arthrodesis and posterior segmental instrumentation;  Surgeon: Newman Pies, MD;  Location: [facility];  Service: Neurosurgery;  Laterality: N/A;    Family History  Problem Relation Age of Onset   Hypertension Other    Cancer Other        ovarian   Cancer Mother    Heart disease Father     Social History   Socioeconomic History   Marital status: Divorced    Spouse name: Not on file   Number of children: 4   Years of education: Not on file   Highest education level: Associate degree: occupational, Hotel manager, or vocational program  Occupational History   Not on file  Tobacco Use   Smoking status: Never    Passive  exposure: Never   Smokeless tobacco: Never  Vaping Use   Vaping Use: Never used  Substance and Sexual Activity   Alcohol use: No    Comment: rarely   Drug use: No   Sexual activity: Not Currently  Other Topics Concern   Not on file  Social History Narrative   Has a living will.   Son is health care POA   Would accept resuscitation.     Would accept life support    No tube feeds if cognitively unaware   Social Determinants of Health   Financial Resource Strain: Not on file  Food Insecurity: Not on file  Transportation Needs: Not on file  Physical Activity: Not on file  Stress: Not on file  Social Connections: Not on file  Intimate Partner Violence: Not on file   Review of Systems Using the hydrocodone 4 times a day Able to sleep Eating okay--has been gaining (thinks she is eating due to depression and boredom)     Objective:   Physical Exam Constitutional:      Appearance: Normal appearance.  Neurological:     Mental Status: She is alert.  Psychiatric:     Comments: Calm but still frustrated           Assessment & Plan:

## 2021-10-23 NOTE — Assessment & Plan Note (Signed)
Significant disease on MRI Hasn't gone back to Dr Arnoldo Morale yet--but doesn't want surgery Will refer to physiatry and pain clinic to see if she can get more help Continue norco 7.5/325 four times a day for now

## 2021-10-24 ENCOUNTER — Encounter: Payer: Self-pay | Admitting: Family Medicine

## 2021-10-30 ENCOUNTER — Other Ambulatory Visit: Payer: Self-pay | Admitting: Internal Medicine

## 2021-10-31 ENCOUNTER — Telehealth: Payer: Self-pay | Admitting: Internal Medicine

## 2021-11-01 ENCOUNTER — Encounter: Payer: Self-pay | Admitting: *Deleted

## 2021-11-01 NOTE — Telephone Encounter (Signed)
error 

## 2021-11-02 ENCOUNTER — Other Ambulatory Visit: Payer: Self-pay | Admitting: Internal Medicine

## 2021-11-13 ENCOUNTER — Encounter: Payer: Self-pay | Admitting: Internal Medicine

## 2021-11-17 ENCOUNTER — Encounter: Payer: Self-pay | Admitting: Internal Medicine

## 2021-11-18 ENCOUNTER — Other Ambulatory Visit: Payer: Self-pay

## 2021-11-18 ENCOUNTER — Other Ambulatory Visit: Payer: Self-pay | Admitting: Internal Medicine

## 2021-11-18 MED ORDER — HYDROCODONE-ACETAMINOPHEN 7.5-325 MG PO TABS: 1.0000 | ORAL_TABLET | Freq: Four times a day (QID) | ORAL | 0 refills | Status: DC | PRN

## 2021-11-18 NOTE — Telephone Encounter (Addendum)
Name of Medication: Hydrocodone apap 7.5 - 325 mg ?Name of Pharmacy: [facility] ?Last Fill or Written Date and Quantity: # 120 on 10/15/2021 ?Last Office Visit and Type: 10/23/21 chronic pain ?Next Office Visit and Type: 01/20/22 for 3 mth FU ?Last Controlled Substance Agreement Date:none seen  ?Last XTA:VWPV seen ? ? ? ? ?[name] - Client ?TELEPHONE ADVICE RECORD ?[facility]? ?Patient ?Name: ?[name] ?[name] ?Gender: Female ?DOB: 23-Jan-1946 ?Age: 76 Y 64 M 14 D ?Return ?Phone ?Number: ?[id number] ?(Primary) ?Address: ?City/ ?State/ ?Zip: ?[place] ?[address] ?Client [name] - Client ?Client Site [facility] ?Provider Viviana Simpler- MD ?Contact Type Call ?Who Is Calling Patient / Member / Family / Caregiver ?Call Type Triage / Clinical ?Relationship To Patient Self ?Return Phone Number [phone] (Primary) ?Chief Complaint Prescription Refill or Medication Request (non ?symptomatic) ?Reason for Call Medication Question / Request ?Initial Comment Caller states she is running out of pain medicine ?(hydrocodone) and does not have enough for ?tomorrow. No symptoms. ?Translation No ?Nurse Assessment ?Nurse: Gareth Eagle, RN, Raquel Sarna Date/Time Eilene Ghazi Time): 11/17/2021 5:03:32 PM ?Confirm and document reason for call. If ?symptomatic, describe symptoms. ?---Caller states she is running out of pain medicine ?(hydrocodone) and does not have enough for ?tomorrow. No symptoms. ?Does the patient have any new or worsening ?symptoms? ---No ?Nurse: Gareth Eagle, RN, Raquel Sarna Date/Time (Eastern Time): 11/17/2021 5:04:12 PM ?Please select the assessment type ---Request for controlled medication refill ?Additional Documentation ---Caller states she will run out of her pain medication ?tomorrow and is requesting refill. ?Is there an on-call physician for the client? ---Yes ?Do the client directives specifically allow for paging ?the on-call regarding  scheduled drugs? ---No ?Additional Documentation ?---Advised caller that controlled meds cannot be ?handled after hours and requested she call office in ?AM for refill. ?Disp. Time (Eastern ?Time) Disposition Final User ?11/17/2021 5:06:25 PM Clinical Call Yes Gareth Eagle, RN, Raquel Sarna ?PLEASE NOTE: All timestamps contained within this report are represented as [place] Standard Time. ?CONFIDENTIALTY NOTICE: This fax transmission is intended only for the addressee. It contains information that is legally privileged, confidential or ?otherwise protected from use or disclosure. If you are not the intended recipient, you are strictly prohibited from reviewing, disclosing, copying using ?or disseminating any of this information or taking any action in reliance on or regarding this information. If you have received this fax in error, please ?notify us immediately by telephone so that we can arrange for its return to [place]. Phone: [phone], Toll-Free: [phone], Fax: [phone] ?Page: 2 of  ?

## 2021-11-18 NOTE — Telephone Encounter (Signed)
Name of Medication: Hydrocodone ?[facility] of [facility] ?Last Fill or Written Date and Quantity: 10/15/21 #120 ?Last Office Visit and Type: 10/23/21 ?Next Office Visit and Type: 01/20/22 ?Last Controlled Substance Agreement Date: none ?Last TDD:UKGU ? ? ?

## 2021-11-20 ENCOUNTER — Other Ambulatory Visit: Payer: Self-pay

## 2021-11-21 MED ORDER — DONEPEZIL HCL 5 MG PO TABS
5.0000 mg | ORAL_TABLET | Freq: Every day | ORAL | 3 refills | Status: DC
Start: 2021-11-21 — End: 2021-12-03

## 2021-11-21 MED ORDER — DONEPEZIL HCL 5 MG PO TABS
5.0000 mg | ORAL_TABLET | Freq: Every day | ORAL | 1 refills | Status: DC
Start: 2021-11-21 — End: 2021-11-21

## 2021-11-21 MED ORDER — DONEPEZIL HCL 5 MG PO TABS: 5.0000 mg | ORAL_TABLET | Freq: Every day | ORAL | 1 refills | Status: DC

## 2021-12-03 ENCOUNTER — Other Ambulatory Visit: Payer: Self-pay

## 2021-12-03 ENCOUNTER — Emergency Department: Payer: Medicare Other

## 2021-12-03 ENCOUNTER — Encounter: Payer: Self-pay | Admitting: Emergency Medicine

## 2021-12-03 ENCOUNTER — Inpatient Hospital Stay
Admission: EM | Admit: 2021-12-03 | Discharge: 2021-12-07 | DRG: 229 | Disposition: A | Discharge status: Home / Self Care | Payer: Medicare Other | Source: Ambulatory Visit | Attending: Internal Medicine | Admitting: Internal Medicine

## 2021-12-03 DIAGNOSIS — E1122 Type 2 diabetes mellitus with diabetic chronic kidney disease: Secondary | ICD-10-CM | POA: Diagnosis present

## 2021-12-03 DIAGNOSIS — E669 Obesity, unspecified: Secondary | ICD-10-CM | POA: Diagnosis present

## 2021-12-03 DIAGNOSIS — Z96642 Presence of left artificial hip joint: Secondary | ICD-10-CM | POA: Diagnosis present

## 2021-12-03 DIAGNOSIS — N1831 Chronic kidney disease, stage 3a: Secondary | ICD-10-CM | POA: Diagnosis present

## 2021-12-03 DIAGNOSIS — R001 Bradycardia, unspecified: Secondary | ICD-10-CM | POA: Diagnosis present

## 2021-12-03 DIAGNOSIS — R413 Other amnesia: Secondary | ICD-10-CM | POA: Diagnosis present

## 2021-12-03 DIAGNOSIS — F32A Depression, unspecified: Secondary | ICD-10-CM | POA: Diagnosis not present

## 2021-12-03 DIAGNOSIS — R778 Other specified abnormalities of plasma proteins: Secondary | ICD-10-CM | POA: Diagnosis present

## 2021-12-03 DIAGNOSIS — R7989 Other specified abnormal findings of blood chemistry: Secondary | ICD-10-CM | POA: Diagnosis present

## 2021-12-03 DIAGNOSIS — I441 Atrioventricular block, second degree: Secondary | ICD-10-CM | POA: Diagnosis not present

## 2021-12-03 DIAGNOSIS — E1165 Type 2 diabetes mellitus with hyperglycemia: Secondary | ICD-10-CM | POA: Diagnosis not present

## 2021-12-03 DIAGNOSIS — R9431 Abnormal electrocardiogram [ECG] [EKG]: Secondary | ICD-10-CM | POA: Diagnosis not present

## 2021-12-03 DIAGNOSIS — Z6835 Body mass index (BMI) 35.0-35.9, adult: Secondary | ICD-10-CM | POA: Diagnosis not present

## 2021-12-03 DIAGNOSIS — G894 Chronic pain syndrome: Secondary | ICD-10-CM | POA: Diagnosis present

## 2021-12-03 DIAGNOSIS — Z01818 Encounter for other preprocedural examination: Secondary | ICD-10-CM | POA: Diagnosis not present

## 2021-12-03 DIAGNOSIS — I517 Cardiomegaly: Secondary | ICD-10-CM | POA: Diagnosis not present

## 2021-12-03 DIAGNOSIS — I639 Cerebral infarction, unspecified: Secondary | ICD-10-CM | POA: Diagnosis not present

## 2021-12-03 DIAGNOSIS — I130 Hypertensive heart and chronic kidney disease with heart failure and stage 1 through stage 4 chronic kidney disease, or unspecified chronic kidney disease: Secondary | ICD-10-CM | POA: Diagnosis present

## 2021-12-03 DIAGNOSIS — E785 Hyperlipidemia, unspecified: Secondary | ICD-10-CM | POA: Diagnosis not present

## 2021-12-03 DIAGNOSIS — N1832 Chronic kidney disease, stage 3b: Secondary | ICD-10-CM | POA: Diagnosis not present

## 2021-12-03 DIAGNOSIS — Z006 Encounter for examination for normal comparison and control in clinical research program: Secondary | ICD-10-CM | POA: Diagnosis not present

## 2021-12-03 DIAGNOSIS — I5032 Chronic diastolic (congestive) heart failure: Secondary | ICD-10-CM | POA: Diagnosis not present

## 2021-12-03 DIAGNOSIS — Z8673 Personal history of transient ischemic attack (TIA), and cerebral infarction without residual deficits: Secondary | ICD-10-CM | POA: Diagnosis not present

## 2021-12-03 DIAGNOSIS — Z8249 Family history of ischemic heart disease and other diseases of the circulatory system: Secondary | ICD-10-CM | POA: Diagnosis not present

## 2021-12-03 DIAGNOSIS — E1121 Type 2 diabetes mellitus with diabetic nephropathy: Secondary | ICD-10-CM

## 2021-12-03 DIAGNOSIS — E1129 Type 2 diabetes mellitus with other diabetic kidney complication: Secondary | ICD-10-CM | POA: Diagnosis present

## 2021-12-03 DIAGNOSIS — I442 Atrioventricular block, complete: Secondary | ICD-10-CM | POA: Diagnosis not present

## 2021-12-03 DIAGNOSIS — I459 Conduction disorder, unspecified: Secondary | ICD-10-CM | POA: Diagnosis not present

## 2021-12-03 DIAGNOSIS — M545 Low back pain, unspecified: Secondary | ICD-10-CM | POA: Diagnosis not present

## 2021-12-03 LAB — BASIC METABOLIC PANEL
Anion gap: 10 (ref 5–15)
Anion gap: 5 (ref 5–15)
BUN: 30 mg/dL — ABNORMAL HIGH (ref 8–23)
BUN: 28 mg/dL — ABNORMAL HIGH (ref 8–23)
CO2: 25 mmol/L (ref 22–32)
CO2: 26 mmol/L (ref 22–32)
Calcium: 9 mg/dL (ref 8.9–10.3)
Calcium: 8.4 mg/dL — ABNORMAL LOW (ref 8.9–10.3)
Chloride: 103 mmol/L (ref 98–111)
Chloride: 106 mmol/L (ref 98–111)
Creatinine, Ser: 1.2 mg/dL — ABNORMAL HIGH (ref 0.44–1.00)
Creatinine, Ser: 1.14 mg/dL — ABNORMAL HIGH (ref 0.44–1.00)
GFR, Estimated: 48 mL/min — ABNORMAL LOW (ref >60)
GFR, Estimated: 51 mL/min — ABNORMAL LOW (ref >60)
Glucose, Bld: 143 mg/dL — ABNORMAL HIGH (ref 70–99)
Glucose, Bld: 213 mg/dL — ABNORMAL HIGH (ref 70–99)
Potassium: 4.3 mmol/L (ref 3.5–5.1)
Potassium: 3.7 mmol/L (ref 3.5–5.1)
Sodium: 138 mmol/L (ref 135–145)
Sodium: 137 mmol/L (ref 135–145)

## 2021-12-03 LAB — CBC
HCT: 38.1 % (ref 36.0–46.0)
Hemoglobin: 12.5 g/dL (ref 12.0–15.0)
MCH: 31.5 pg (ref 26.0–34.0)
MCHC: 32.8 g/dL (ref 30.0–36.0)
MCV: 96 fL (ref 80.0–100.0)
Platelets: 233 10*3/uL (ref 150–400)
RBC: 3.97 MIL/uL (ref 3.87–5.11)
RDW: 12.9 % (ref 11.5–15.5)
WBC: 8.3 10*3/uL (ref 4.0–10.5)
nRBC: 0 % (ref 0.0–0.2)

## 2021-12-03 LAB — CBC WITH DIFFERENTIAL/PLATELET
Abs Immature Granulocytes: 0.04 10*3/uL (ref 0.00–0.07)
Basophils Absolute: 0 10*3/uL (ref 0.0–0.1)
Basophils Relative: 0 %
Eosinophils Absolute: 0.1 10*3/uL (ref 0.0–0.5)
Eosinophils Relative: 1 %
HCT: 44.5 % (ref 36.0–46.0)
Hemoglobin: 14.4 g/dL (ref 12.0–15.0)
Immature Granulocytes: 0 %
Lymphocytes Relative: 14 %
Lymphs Abs: 1.5 10*3/uL (ref 0.7–4.0)
MCH: 31.7 pg (ref 26.0–34.0)
MCHC: 32.4 g/dL (ref 30.0–36.0)
MCV: 98 fL (ref 80.0–100.0)
Monocytes Absolute: 0.8 10*3/uL (ref 0.1–1.0)
Monocytes Relative: 7 %
Neutro Abs: 8.1 10*3/uL — ABNORMAL HIGH (ref 1.7–7.7)
Neutrophils Relative %: 78 %
Platelets: 262 10*3/uL (ref 150–400)
RBC: 4.54 MIL/uL (ref 3.87–5.11)
RDW: 12.9 % (ref 11.5–15.5)
WBC: 10.5 10*3/uL (ref 4.0–10.5)
nRBC: 0 % (ref 0.0–0.2)

## 2021-12-03 LAB — TSH: TSH: 1.903 u[IU]/mL (ref 0.350–4.500)

## 2021-12-03 LAB — PROTIME-INR
INR: 1 (ref 0.8–1.2)
Prothrombin Time: 12.8 seconds (ref 11.4–15.2)

## 2021-12-03 LAB — HEMOGLOBIN A1C
Hgb A1c MFr Bld: 6.3 % — ABNORMAL HIGH (ref 4.8–5.6)
Mean Plasma Glucose: 134.11 mg/dL

## 2021-12-03 LAB — TROPONIN I (HIGH SENSITIVITY)
Troponin I (High Sensitivity): 18 ng/L — ABNORMAL HIGH (ref <18)
Troponin I (High Sensitivity): 18 ng/L — ABNORMAL HIGH (ref <18)

## 2021-12-03 LAB — GLUCOSE, CAPILLARY
Glucose-Capillary: 266 mg/dL — ABNORMAL HIGH (ref 70–99)
Glucose-Capillary: 265 mg/dL — ABNORMAL HIGH (ref 70–99)

## 2021-12-03 LAB — BRAIN NATRIURETIC PEPTIDE: B Natriuretic Peptide: 426.7 pg/mL — ABNORMAL HIGH (ref 0.0–100.0)

## 2021-12-03 LAB — MAGNESIUM
Magnesium: 2.2 mg/dL (ref 1.7–2.4)
Magnesium: 2.2 mg/dL (ref 1.7–2.4)

## 2021-12-03 LAB — MRSA NEXT GEN BY PCR, NASAL: MRSA by PCR Next Gen: NOT DETECTED

## 2021-12-03 MED ORDER — MELOXICAM 7.5 MG PO TABS
15.0000 mg | ORAL_TABLET | Freq: Every day | ORAL | Status: DC
  Administered 2021-12-04 – 2021-12-06 (×3): 15 mg via ORAL
  Filled 2021-12-03 (×4): qty 2

## 2021-12-03 MED ORDER — GABAPENTIN 300 MG PO CAPS
900.0000 mg | ORAL_CAPSULE | Freq: Every day | ORAL | Status: DC
  Administered 2021-12-03 – 2021-12-06 (×4): 900 mg via ORAL
  Filled 2021-12-03 (×4): qty 3

## 2021-12-03 MED ORDER — DONEPEZIL HCL 5 MG PO TABS: 10.0000 mg | ORAL_TABLET | Freq: Every morning | ORAL | Status: DC

## 2021-12-03 MED ORDER — HYDROCHLOROTHIAZIDE 25 MG PO TABS
25.0000 mg | ORAL_TABLET | Freq: Every day | ORAL | Status: DC
  Administered 2021-12-04 – 2021-12-07 (×4): 25 mg via ORAL
  Filled 2021-12-03 (×4): qty 1

## 2021-12-03 MED ORDER — OMEGA-3-ACID ETHYL ESTERS 1 G PO CAPS
1.0000 g | ORAL_CAPSULE | Freq: Every day | ORAL | Status: DC
  Administered 2021-12-04 – 2021-12-07 (×4): 1 g via ORAL
  Filled 2021-12-03 (×4): qty 1

## 2021-12-03 MED ORDER — ASCORBIC ACID 500 MG PO TABS
500.0000 mg | ORAL_TABLET | Freq: Every day | ORAL | Status: DC
  Administered 2021-12-04 – 2021-12-07 (×4): 500 mg via ORAL
  Filled 2021-12-03 (×4): qty 1

## 2021-12-03 MED ORDER — GABAPENTIN 300 MG PO CAPS
600.0000 mg | ORAL_CAPSULE | Freq: Two times a day (BID) | ORAL | Status: DC
  Administered 2021-12-04 – 2021-12-07 (×4): 600 mg via ORAL
  Filled 2021-12-03 (×5): qty 2

## 2021-12-03 MED ORDER — HYDROCODONE-ACETAMINOPHEN 7.5-325 MG PO TABS
1.0000 | ORAL_TABLET | Freq: Four times a day (QID) | ORAL | Status: DC | PRN
  Administered 2021-12-04 – 2021-12-07 (×8): 1 via ORAL
  Filled 2021-12-03 (×8): qty 1

## 2021-12-03 MED ORDER — INSULIN ASPART 100 UNIT/ML IJ SOLN
0.0000 [IU] | Freq: Every day | INTRAMUSCULAR | Status: DC
  Administered 2021-12-03: 3 [IU] via SUBCUTANEOUS
  Administered 2021-12-06: 2 [IU] via SUBCUTANEOUS
  Filled 2021-12-03 (×2): qty 1

## 2021-12-03 MED ORDER — ASPIRIN EC 81 MG PO TBEC
81.0000 mg | DELAYED_RELEASE_TABLET | Freq: Every day | ORAL | Status: DC
Start: 2021-12-03 — End: 2021-12-07
  Administered 2021-12-03 – 2021-12-07 (×5): 81 mg via ORAL
  Filled 2021-12-03 (×5): qty 1

## 2021-12-03 MED ORDER — ARIPIPRAZOLE 2 MG PO TABS
2.0000 mg | ORAL_TABLET | Freq: Every day | ORAL | Status: DC
  Administered 2021-12-03 – 2021-12-06 (×4): 2 mg via ORAL
  Filled 2021-12-03 (×4): qty 1

## 2021-12-03 MED ORDER — DULOXETINE HCL 30 MG PO CPEP
60.0000 mg | ORAL_CAPSULE | Freq: Every day | ORAL | Status: DC
  Administered 2021-12-04 – 2021-12-07 (×4): 60 mg via ORAL
  Filled 2021-12-03 (×5): qty 2

## 2021-12-03 MED ORDER — LISINOPRIL 20 MG PO TABS
20.0000 mg | ORAL_TABLET | Freq: Every day | ORAL | Status: DC
  Administered 2021-12-04 – 2021-12-07 (×4): 20 mg via ORAL
  Filled 2021-12-03 (×4): qty 1

## 2021-12-03 MED ORDER — INSULIN ASPART 100 UNIT/ML IJ SOLN
0.0000 [IU] | Freq: Three times a day (TID) | INTRAMUSCULAR | Status: DC
  Administered 2021-12-04: 1 [IU] via SUBCUTANEOUS
  Administered 2021-12-04: 2 [IU] via SUBCUTANEOUS
  Administered 2021-12-05: 1 [IU] via SUBCUTANEOUS
  Administered 2021-12-06: 2 [IU] via SUBCUTANEOUS
  Filled 2021-12-03 (×5): qty 1

## 2021-12-03 MED ORDER — CHLORHEXIDINE GLUCONATE CLOTH 2 % EX PADS
6.0000 | MEDICATED_PAD | Freq: Every day | CUTANEOUS | Status: DC
  Administered 2021-12-03 – 2021-12-07 (×5): 6 via TOPICAL

## 2021-12-03 MED ORDER — GABAPENTIN 300 MG PO CAPS: 600.0000 mg | ORAL_CAPSULE | Freq: Three times a day (TID) | ORAL | Status: DC

## 2021-12-03 MED ORDER — HEPARIN SODIUM (PORCINE) 5000 UNIT/ML IJ SOLN
5000.0000 [IU] | Freq: Three times a day (TID) | INTRAMUSCULAR | Status: DC
  Administered 2021-12-04 – 2021-12-07 (×5): 5000 [IU] via SUBCUTANEOUS
  Filled 2021-12-03 (×5): qty 1

## 2021-12-03 MED ORDER — VITAMIN D 25 MCG (1000 UNIT) PO TABS
1000.0000 [IU] | ORAL_TABLET | Freq: Every day | ORAL | Status: DC
  Administered 2021-12-04 – 2021-12-07 (×4): 1000 [IU] via ORAL
  Filled 2021-12-03 (×4): qty 1

## 2021-12-03 MED ORDER — ADULT MULTIVITAMIN W/MINERALS CH
1.0000 | ORAL_TABLET | Freq: Every day | ORAL | Status: DC
  Administered 2021-12-04 – 2021-12-07 (×4): 1 via ORAL
  Filled 2021-12-03 (×4): qty 1

## 2021-12-03 MED ORDER — ATROPINE SULFATE 1 MG/10ML IJ SOSY: 1.0000 mg | PREFILLED_SYRINGE | INTRAMUSCULAR | Status: DC | PRN

## 2021-12-03 MED ORDER — CO Q 10 100 MG PO CAPS: 100.0000 mg | ORAL_CAPSULE | Freq: Every day | ORAL | Status: DC

## 2021-12-03 MED ORDER — HYDRALAZINE HCL 25 MG PO TABS
25.0000 mg | ORAL_TABLET | Freq: Two times a day (BID) | ORAL | Status: DC
  Administered 2021-12-03 – 2021-12-07 (×8): 25 mg via ORAL
  Filled 2021-12-03 (×8): qty 1

## 2021-12-03 MED ORDER — LISINOPRIL-HYDROCHLOROTHIAZIDE 20-25 MG PO TABS: 2.0000 | ORAL_TABLET | Freq: Every day | ORAL | Status: DC

## 2021-12-03 MED ORDER — ATORVASTATIN CALCIUM 80 MG PO TABS
80.0000 mg | ORAL_TABLET | Freq: Every day | ORAL | Status: DC
  Administered 2021-12-03 – 2021-12-06 (×4): 80 mg via ORAL
  Filled 2021-12-03 (×4): qty 4

## 2021-12-03 MED ORDER — SOYA LECITHIN 1200 MG PO CAPS: 1200.0000 mg | ORAL_CAPSULE | Freq: Three times a day (TID) | ORAL | Status: DC

## 2021-12-03 NOTE — ED Notes (Signed)
Pt denies any chest pain or SOB. Pt states pain 5/10in back and this is the time she would typically take her hydrocodone. EDP made aware.  ?

## 2021-12-03 NOTE — ED Triage Notes (Signed)
Pt via POV from [facility], pt was sent over for HR in the 30s. Pt is asymptomatic. Pt was at [facility] for a routine. Denies any pain, weakness or SOB. Pt is A&OX4 and NAD.  ? ?Pt in Room 6, pt placed on Zoll pads.  ?

## 2021-12-03 NOTE — H&P (Addendum)
?History and Physical  ? ? ?[name] QPR:[id number] DOB: 1946/01/31 DOA: 12/03/2021 ? ?Referring MD/NP/PA:  ? ?PCP: Venia Carbon, MD  ? ?Patient coming from:  The patient is coming from home.   ? ?Chief Complaint: Bradycardia ? ?HPI: [name] is a 76 y.o. female with medical history significant of hypertension, hyperlipidemia, diet-controlled diabetes, memory loss, IBS, dCHF, CKD-3B, chronic pain, who presents with bradycardia. ? ?Patient states that she has history of chronic pain syndrome, basically pain all over.  She was seen in [facility] pain clinic today, and was found to have bradycardia.  Heart rate in 30s.  Patient was sent to emergency room for further evaluation and treatment.  Patient is asymptomatic.  She does not have chest pain, shortness breath, dizziness or lightheadedness.  No cough, fever or chills.  No nausea, vomiting, diarrhea or abdominal pain.  No symptoms of UTI.  No syncope.  Patient is not taking nodal blockers or digoxin. ? ?The EKG showed high-grade AV block, second-degree type II AV block and complete AV block.  Heart rate is 34-30 in ED. ? ?Data Reviewed and ED Course: pt was found to have WBC 10.5, troponin level 18, temperature normal, blood pressure 162/68, RR 21, oxygen saturation 94% on room air.  Chest x-ray showed cardiomegaly without infiltration.  Patient is admitted to stepdown as inpatient.  Dr. Humphrey Rolls of cardiology was consulted by EDP. Dr. Saralyn Pilar is aware of this pt, planning to put in the permanent pacemaker in about 3 days since she had already taken Plavix today.  ? ?EKG: I have personally reviewed.  Complete heart block, QTc 404, poor R wave progression, T wave inversion in lead III/aVF, and V3-V6. ? ?Review of Systems:  ? ?General: no fevers, chills, no body weight gain, fatigue ?HEENT: no blurry vision, hearing changes or sore throat ?Respiratory: no dyspnea, coughing, wheezing ?CV: no chest pain, no palpitations ?GI: no nausea, vomiting, abdominal pain,  diarrhea, constipation ?GU: no dysuria, burning on urination, increased urinary frequency, hematuria  ?Ext: no leg edema ?Neuro: no unilateral weakness, numbness, or tingling, no vision change or hearing loss ?Skin: no rash, no skin tear. ?MSK: has pain all over. ?Heme: No easy bruising.  ?Travel history: No recent long distant travel. ? ? ?Allergy: No Known Allergies ? ?Past Medical History:  ?Diagnosis Date  ? Arthritis   ? Confusion   ? Diabetes mellitus without complication ([place])   ? type 2  ? Hepatitis   ? pt. denies  ? Hypertension   ? Lumbar stenosis   ? Memory loss   ? Nocturia   ? Numbness and tingling   ? legs and feet  ? Wears glasses   ? ? ?Past Surgical History:  ?Procedure Laterality Date  ? BACK SURGERY    ? [place]  ? COLONOSCOPY    ? HEMORRHOID SURGERY  2015  ? JOINT REPLACEMENT    ? 01-08-18 Dr. Kathrynn Speed  ? TOTAL HIP ARTHROPLASTY Left 01/08/2018  ? Procedure: LEFT TOTAL HIP ARTHROPLASTY ANTERIOR APPROACH;  Surgeon: Mcarthur Rossetti, MD;  Location: [facility];  Service: Orthopedics;  Laterality: Left;  ? TRANSFORAMINAL LUMBAR INTERBODY FUSION (TLIF) WITH PEDICLE SCREW FIXATION 2 LEVEL N/A 11/07/2015  ? Procedure: Lumbar three-four Lumbar four-five  transforaminal lumbar interbody fusion with interbody prosthesis posterior lateral arthrodesis and posterior segmental instrumentation;  Surgeon: Newman Pies, MD;  Location: [facility];  Service: Neurosurgery;  Laterality: N/A;  ? ? ?Social History:  reports that  she has never smoked. She has never been exposed to tobacco smoke. She has never used smokeless tobacco. She reports that she does not drink alcohol and does not use drugs. ? ?Family History:  ?Family History  ?Problem Relation Age of Onset  ? Hypertension Other   ? Cancer Other   ?     ovarian  ? Cancer Mother   ? Heart disease Father   ?  ? ?Prior to Admission medications   ?Medication Sig Start Date End Date Taking? Authorizing Provider  ?Accu-Chek Softclix Lancets  lancets Use as instructed 02/28/20   Venia Carbon, MD  ?amLODipine (NORVASC) 5 MG tablet Take 1 tablet (5 mg total) by mouth daily. 10/04/21   Venia Carbon, MD  ?ARIPiprazole (ABILIFY) 2 MG tablet TAKE 1 TABLET BY MOUTH EVERYDAY AT BEDTIME 11/03/21   Venia Carbon, MD  ?atorvastatin (LIPITOR) 80 MG tablet Take 1 tablet (80 mg total) by mouth daily. 10/04/21 01/02/22  Venia Carbon, MD  ?Cholecalciferol (VITAMIN D3) 25 MCG (1000 UT) CAPS Take 1,000 Units by mouth daily.     [provider]  ?Chromium Picolinate 1000 MCG TABS Take 1,000 mcg by mouth daily.     [provider]  ?clopidogrel (PLAVIX) 75 MG tablet Take 75 mg by mouth daily. 05/20/21   [provider]  ?Coenzyme Q10 (CO Q 10) 100 MG CAPS Take 100 mg by mouth daily.     [provider]  ?donepezil (ARICEPT) 5 MG tablet Take 1 tablet (5 mg total) by mouth at bedtime. 11/21/21   Venia Carbon, MD  ?DULoxetine (CYMBALTA) 60 MG capsule TAKE 1 CAPSULE BY MOUTH EVERY DAY 10/31/21   Venia Carbon, MD  ?gabapentin (NEURONTIN) 300 MG capsule TAKE 2 CAPSULES BY MOUTH TWICE DAILY DURING THE DAY AND 3 AT BEDTIME 11/18/21   Viviana Simpler I, MD  ?glucose blood (ONETOUCH VERIO) test strip Use to check blood sugar once a day Dx Code E11.21 05/10/21   Venia Carbon, MD  ?hydrALAZINE (APRESOLINE) 25 MG tablet TAKE 1 TABLET (25 MG TOTAL) BY MOUTH 4 (FOUR) TIMES DAILY. ?Patient taking differently: Take 25 mg by mouth 4 (four) times daily. Pt states she is taking 2 a day 07/01/21   Venia Carbon, MD  ?HYDROcodone-acetaminophen (NORCO) 7.5-325 MG tablet Take 1 tablet by mouth every 6 (six) hours as needed for moderate pain. 11/18/21   Venia Carbon, MD  ?lisinopril-hydrochlorothiazide (ZESTORETIC) 20-25 MG tablet TAKE 2 TABLETS BY MOUTH EVERY DAY 11/03/21   Venia Carbon, MD  ?meloxicam (MOBIC) 15 MG tablet TAKE 1 TABLET BY MOUTH EVERY DAY 11/03/21   Venia Carbon, MD  ?Multiple Vitamins-Minerals  (MULTIVITAMIN WITH MINERALS) tablet Take 1 tablet by mouth daily.     [provider]  ?Omega-3 Fatty Acids (FISH OIL) 1000 MG CAPS Take 2,000 mg by mouth.    [provider]  ?Soya Lecithin 1200 MG CAPS Take 1,200 mg by mouth 3 (three) times daily.     [provider]  ?vitamin C (ASCORBIC ACID) 500 MG tablet Take 500 mg by mouth daily.    [provider]  ? ? ?Physical Exam: ?Vitals:  ? 12/03/21 1500 12/03/21 1530 12/03/21 1600 12/03/21 1700  ?BP: 139/67 (!) 162/68 (!) 152/72 (!) 174/93  ?Pulse: (!) 37 (!) 37 (!) 34 (!) 36  ?Resp: 17 (!) '21 18 12  '$ ?Temp:    97.8 ?F (36.6 ?C)  ?TempSrc:    Oral  ?  SpO2: 95% 95% 94% 97%  ?Weight:    84 kg  ?Height:    '5\' 1"'$  (1.549 m)  ? ?General: Not in acute distress ?HEENT: ?      Eyes: PERRL, EOMI, no scleral icterus. ?      ENT: No discharge from the ears and nose, no pharynx injection, no tonsillar enlargement.  ?      Neck: No JVD, no bruit, no mass felt. ?Heme: No neck lymph node enlargement. ?Cardiac: S1/S2, bradycardia, no murmurs, No gallops or rubs. ?Respiratory: No rales, wheezing, rhonchi or rubs. ?GI: Soft, nondistended, nontender, no rebound pain, no organomegaly, BS present. ?GU: No hematuria ?Ext: No pitting leg edema bilaterally. 1+DP/PT pulse bilaterally. ?Musculoskeletal: No joint deformities, No joint redness or warmth, no limitation of ROM in spin. ?Skin: No rashes.  ?Neuro: Alert, oriented X3, cranial nerves II-XII grossly intact, moves all extremities normally.  ? ?Psych: Patient is not psychotic, no suicidal or hemocidal ideation. ? ?Labs on Admission: I have personally reviewed following labs and imaging studies ? ?CBC: ?Recent Labs  ?Lab 12/03/21 ?1444  ?WBC 10.5  ?NEUTROABS 8.1*  ?HGB 14.4  ?HCT 44.5  ?MCV 98.0  ?PLT 262  ? ?Basic Metabolic Panel: ?Recent Labs  ?Lab 12/03/21 ?1444  ?NA 138  ?K 4.3  ?CL 103  ?CO2 25  ?GLUCOSE 143*  ?BUN 30*  ?CREATININE 1.20*  ?CALCIUM 9.0  ?MG 2.2  ? ?GFR: ?Estimated Creatinine  Clearance: 41.1 mL/min (A) (by C-G formula based on SCr of 1.2 mg/dL (H)). ?Liver Function Tests: ?No results for input(s): AST, ALT, ALKPHOS, BILITOT, PROT, ALBUMIN in the last 168 hours. ?No results for input(s): LIPASE, AM

## 2021-12-03 NOTE — Plan of Care (Signed)
ANOx4. Forgetful. Education provided on medication regimen in hospital. Pt refused SQ heparin; NP [place] notified. HR continues to be in the 30s w/complete heart block. BP stable. Pt asymptomatic. Back pain 7/10; PRN norco given with good relief.  ?Problem: Education: ?Goal: Knowledge of General Education information will improve ?Description: Including pain rating scale, medication(s)/side effects and non-pharmacologic comfort measures ?Outcome: Progressing ?  ?Problem: Health Behavior/Discharge Planning: ?Goal: Ability to manage health-related needs will improve ?Outcome: Progressing ?  ?Problem: Clinical Measurements: ?Goal: Ability to maintain clinical measurements within normal limits will improve ?Outcome: Progressing ?Goal: Will remain free from infection ?Outcome: Progressing ?Goal: Diagnostic test results will improve ?Outcome: Progressing ?Goal: Respiratory complications will improve ?Outcome: Progressing ?Goal: Cardiovascular complication will be avoided ?Outcome: Progressing ?  ?Problem: Activity: ?Goal: Risk for activity intolerance will decrease ?Outcome: Progressing ?  ?Problem: Nutrition: ?Goal: Adequate nutrition will be maintained ?Outcome: Progressing ?  ?Problem: Coping: ?Goal: Level of anxiety will decrease ?Outcome: Progressing ?  ?Problem: Elimination: ?Goal: Will not experience complications related to bowel motility ?Outcome: Progressing ?Goal: Will not experience complications related to urinary retention ?Outcome: Progressing ?  ?Problem: Pain Managment: ?Goal: General experience of comfort will improve ?Outcome: Progressing ?  ?Problem: Safety: ?Goal: Ability to remain free from injury will improve ?Outcome: Progressing ?  ?Problem: Skin Integrity: ?Goal: Risk for impaired skin integrity will decrease ?Outcome: Progressing ?  ?

## 2021-12-03 NOTE — Progress Notes (Signed)
Admitted from ED bia stretcher. Patient was rude and demanding. Ask for pain medication for back. With a heart rate of 35 nurse felt it was unsafe to medicate with prn Norco. Dr. Blaine Hamper informed. MD also ask for diabetic orders. None received. When admission history done patient very paranoid about answering questions even when every question explained. Very forgetful. When ask about home medications stated she took 12 pills in the morning and 6 pills at night . She admitted to n,ot knowing what the pills were. Also stated she 'fixed' her own medications. She lives alone. Patient was mad because she did not need to go to ICU. She just wanted pain medication.  Explained that her heart rate was 35 and she remarked "sooooo". Explained that a low heart rate was dangerous. Patient was more concerned about her dinner. ?

## 2021-12-03 NOTE — Consult Note (Signed)
? ? ?[name] is a 76 y.o. female  [id number] ? ?Primary Cardiologist: Neoma Laming ?Reason for Consultation: Type II second-degree AV block ? ?HPI: This is a 76 year old white female with a past medical history of arthritis hypertension diabetes who was seen by physiology and [place] for back pain and was found to be bradycardic and was sent to the emergency room for evaluation.  She denies dizziness palpitation fluttering chest pain or shortness of breath ? ? ?Review of Systems: No chest pain just back pain ? ? ?Past Medical History:  ?Diagnosis Date  ? Arthritis   ? Confusion   ? Diabetes mellitus without complication ([place])   ? type 2  ? Hepatitis   ? pt. denies  ? Hypertension   ? Lumbar stenosis   ? Memory loss   ? Nocturia   ? Numbness and tingling   ? legs and feet  ? Wears glasses   ? ? ?(Not in a hospital admission) ? ? ? ? ? ?Infusions: ? ? ?No Known Allergies ? ?Social History  ? ?Socioeconomic History  ? Marital status: Divorced  ?  Spouse name: Not on file  ? Number of children: 4  ? Years of education: Not on file  ? Highest education level: Associate degree: occupational, Hotel manager, or vocational program  ?Occupational History  ? Not on file  ?Tobacco Use  ? Smoking status: Never  ?  Passive exposure: Never  ? Smokeless tobacco: Never  ?Vaping Use  ? Vaping Use: Never used  ?Substance and Sexual Activity  ? Alcohol use: No  ?  Comment: rarely  ? Drug use: No  ? Sexual activity: Not Currently  ?Other Topics Concern  ? Not on file  ?Social History Narrative  ? Has a living will.  ? Son is health care POA  ? Would accept resuscitation.    ? Would accept life support   ? No tube feeds if cognitively unaware  ? ?Social Determinants of Health  ? ?Financial Resource Strain: Not on file  ?Food Insecurity: Not on file  ?Transportation Needs: Not on file  ?Physical Activity: Not on file  ?Stress: Not on file  ?Social Connections: Not on file  ?Intimate Partner Violence: Not on file  ? ? ?Family History   ?Problem Relation Age of Onset  ? Hypertension Other   ? Cancer Other   ?     ovarian  ? Cancer Mother   ? Heart disease Father   ? ? ?PHYSICAL EXAM: ?Vitals:  ? 12/03/21 1442 12/03/21 1445  ?BP: (!) 137/47 (!) 168/64  ?Pulse:  (!) 39  ?Resp:  10  ?Temp:    ?SpO2:  97%  ? ? ?No intake or output data in the 24 hours ending 12/03/21 1528 ? ?General:  Well appearing. No respiratory difficulty ?HEENT: normal ?Neck: supple. no JVD. Carotids 2+ bilat; no bruits. No lymphadenopathy or thryomegaly appreciated. ?Cor: PMI nondisplaced. Regular rate & rhythm. No rubs, gallops or murmurs. ?Lungs: clear ?Abdomen: soft, nontender, nondistended. No hepatosplenomegaly. No bruits or masses. Good bowel sounds. ?Extremities: no cyanosis, clubbing, rash, edema ?Neuro: alert & oriented x 3, cranial nerves grossly intact. moves all 4 extremities w/o difficulty. Affect pleasant. ? ?ECG: Sinus rhythm 39 bpm with type II second-degree AV block ? ?Results for orders placed or performed during the hospital encounter of 12/03/21 (from the past 24 hour(s))  ?CBC with Differential     Status: Abnormal  ? Collection Time: 12/03/21  2:44 PM  ?Result  Value Ref Range  ? WBC 10.5 4.0 - 10.5 K/uL  ? RBC 4.54 3.87 - 5.11 MIL/uL  ? Hemoglobin 14.4 12.0 - 15.0 g/dL  ? HCT 44.5 36.0 - 46.0 %  ? MCV 98.0 80.0 - 100.0 fL  ? MCH 31.7 26.0 - 34.0 pg  ? MCHC 32.4 30.0 - 36.0 g/dL  ? RDW 12.9 11.5 - 15.5 %  ? Platelets 262 150 - 400 K/uL  ? nRBC 0.0 0.0 - 0.2 %  ? Neutrophils Relative % 78 %  ? Neutro Abs 8.1 (H) 1.7 - 7.7 K/uL  ? Lymphocytes Relative 14 %  ? Lymphs Abs 1.5 0.7 - 4.0 K/uL  ? Monocytes Relative 7 %  ? Monocytes Absolute 0.8 0.1 - 1.0 K/uL  ? Eosinophils Relative 1 %  ? Eosinophils Absolute 0.1 0.0 - 0.5 K/uL  ? Basophils Relative 0 %  ? Basophils Absolute 0.0 0.0 - 0.1 K/uL  ? Immature Granulocytes 0 %  ? Abs Immature Granulocytes 0.04 0.00 - 0.07 K/uL  ? ?DG Chest Portable 1 View ? ?Result Date: 12/03/2021 ?CLINICAL DATA:  heart block EXAM:  PORTABLE CHEST 1 VIEW.  Patient is slightly rotated. COMPARISON:  None. FINDINGS: Cardiac paddles overlie the chest. The cardiac silhouette is enlarged in caliber. The heart and mediastinal contours are within normal limits. Linear atelectasis versus scarring of the lingula. No focal consolidation. No pulmonary edema. No pleural effusion. No pneumothorax. No acute osseous abnormality. IMPRESSION: Enlarged cardiac silhouette with no active cardiopulmonary disease. Electronically Signed   By: Iven Finn M.D.   On: 12/03/2021 15:09   ? ? ?ASSESSMENT AND PLAN: Asymptomatic type II second-degree AV block with very slow ventricular response rate 36-39 patient is not on any beta-blockers or Cardizem or digoxin.  Patient was on Norvasc and Plavix which has been stopped.  Patient will need permanent pacemaker while she is here and will consult Dr. Raliegh Scarlet at [place] do that. ? ?Kyro Joswick A  ?

## 2021-12-03 NOTE — ED Provider Notes (Signed)
? ?[facility] ?Provider Note ? ? ? Event Date/Time  ? First MD Initiated Contact with Patient 12/03/21 1445   ?  (approximate) ? ? ?History  ? ?Bradycardia ? ? ?HPI ? ?[name] is a 76 y.o. female   with past medical history of diabetes, hypertension and chronic back pain who presents with bradycardia.  Patient went to [facility] clinic today for routine appointment was found to be bradycardic so was sent to the emergency department.  Patient has no history of bradycardia.  Denies chest pain shortness of breath lightheadedness. ? ?  ? ?Past Medical History:  ?Diagnosis Date  ? Arthritis   ? Confusion   ? Diabetes mellitus without complication ([place])   ? type 2  ? Hepatitis   ? pt. denies  ? Hypertension   ? Lumbar stenosis   ? Memory loss   ? Nocturia   ? Numbness and tingling   ? legs and feet  ? Wears glasses   ? ? ?Patient Active Problem List  ? Diagnosis Date Noted  ? Bursitis of left elbow 09/18/2021  ? Candidal intertrigo 03/29/2021  ? History of stroke 07/04/2020  ? Chronic diastolic CHF (congestive heart failure) ([place]) 06/10/2020  ? Stage 3b chronic kidney disease ([place]) 06/10/2020  ? Type II diabetes mellitus with renal manifestations ([place]) 06/10/2020  ? Abnormal MRI   ? Shoulder tendonitis, left 07/25/2019  ? Chronic narcotic dependence ([place]) 07/04/2019  ? Osteoarthritis of right knee 11/16/2018  ? Urge incontinence 10/07/2018  ? Mycotic toenails 10/07/2018  ? Venous insufficiency 10/07/2018  ? Unilateral primary osteoarthritis, left hip 01/08/2018  ? Status post total replacement of left hip 01/08/2018  ? Pain syndrome, chronic 03/13/2017  ? Facet arthropathy, lumbar 03/13/2017  ? MDD (major depressive disorder), single episode 05/07/2016  ? Advance directive discussed with patient 01/15/2016  ? Spinal stenosis of lumbar region with neurogenic claudication 11/07/2015  ? Other intervertebral disc degeneration, lumbar region 06/01/2015  ? Chronic back pain 03/29/2015  ?  Preventative health care 01/10/2015  ? Osteoarthritis, hip, bilateral 11/28/2014  ? Peripheral neuropathy 11/28/2014  ? Type 2 diabetes, controlled, with neuropathy ([place]) 01/20/2014  ? Episodic mood disorder ([place]) 01/20/2014  ? MCI (mild cognitive impairment) 12/26/2013  ? IBS (irritable bowel syndrome) 12/27/2012  ? Essential hypertension, benign 08/06/2010  ? ? ? ?Physical Exam  ?Triage Vital Signs: ?ED Triage Vitals  ?Enc Vitals Group  ?   BP 12/03/21 1442 (!) 137/47  ?   Pulse Rate 12/03/21 1433 (!) 39  ?   Resp 12/03/21 1433 20  ?   Temp 12/03/21 1433 98.1 ?F (36.7 ?C)  ?   Temp Source 12/03/21 1433 Oral  ?   SpO2 12/03/21 1433 94 %  ?   Weight 12/03/21 1433 180 lb (81.6 kg)  ?   Height 12/03/21 1433 '5\' 1"'$  (1.549 m)  ?   Head Circumference --   ?   Peak Flow --   ?   Pain Score 12/03/21 1432 0  ?   Pain Loc --   ?   Pain Edu? --   ?   Excl. in [place]? --   ? ? ?Most recent vital signs: ?Vitals:  ? 12/03/21 1500 12/03/21 1530  ?BP: 139/67 (!) 162/68  ?Pulse: (!) 37 (!) 37  ?Resp: 17 (!) 21  ?Temp:    ?SpO2: 95% 95%  ? ? ? ?General: Awake, no distress.  ?CV:  Good peripheral perfusion.  1+ edema bilaterally ?  Resp:  Normal effort.  No increased work of breathing ?Abd:  No distention.  ?Neuro:             Awake, Alert, Oriented x 3  ?Other:   ? ? ?ED Results / Procedures / Treatments  ?Labs ?(all labs ordered are listed, but only abnormal results are displayed) ?Labs Reviewed  ?CBC WITH DIFFERENTIAL/PLATELET - Abnormal; Notable for the following components:  ?    Result Value  ? Neutro Abs 8.1 (*)   ? All other components within normal limits  ?BASIC METABOLIC PANEL - Abnormal; Notable for the following components:  ? Glucose, Bld 143 (*)   ? BUN 30 (*)   ? Creatinine, Ser 1.20 (*)   ? GFR, Estimated 48 (*)   ? All other components within normal limits  ?TROPONIN I (HIGH SENSITIVITY) - Abnormal; Notable for the following components:  ? Troponin I (High Sensitivity) 18 (*)   ? All other components within normal limits   ?MAGNESIUM  ?TSH  ? ? ? ?EKG ? ?EKG reviewed by myself shows complete heart block, right bundle branch block appearance, no ischemic changes ? ? ?RADIOLOGY ?I reviewed the CXR which does not show any acute cardiopulmonary process; agree with radiology report  ? ? ? ?PROCEDURES: ? ?Critical Care performed: Yes, see critical care procedure note(s) ? ?.1-3 Lead EKG Interpretation ?Performed by: Rada Hay, MD ?Authorized by: Rada Hay, MD  ? ?  Interpretation: abnormal   ?  ECG rate assessment: bradycardic   ?  Rhythm: A-V block   ?  Ectopy: none   ?  Conduction: abnormal   ?  Abnormal conduction: 3rd degree AV block   ?Marland KitchenCritical Care ?Performed by: Rada Hay, MD ?Authorized by: Rada Hay, MD  ? ?Critical care provider statement:  ?  Critical care time (minutes):  30 ?  Critical care was time spent personally by me on the following activities:  Development of treatment plan with patient or surrogate, discussions with consultants, evaluation of patient's response to treatment, examination of patient, ordering and review of laboratory studies, ordering and review of radiographic studies, ordering and performing treatments and interventions, pulse oximetry, re-evaluation of patient's condition and review of old charts ? ?The patient is on the cardiac monitor to evaluate for evidence of arrhythmia and/or significant heart rate changes. ? ? ?MEDICATIONS ORDERED IN ED: ?Medications - No data to display ? ? ?IMPRESSION / MDM / ASSESSMENT AND PLAN / ED COURSE  ?I reviewed the triage vital signs and the nursing notes. ?             ?               ?Is a 75 year old female with diabetes who presents from [place] clinic with bradycardia.  EKG appears to have high-grade AV block likely complete heart block.  Patient's blood pressure is stable she is asymptomatic at this time without chest pain shortness of breath lightheadedness or dizziness.  She is not on any AV nodal blocking since but does  take Plavix due to history of CVA.  Consulted Dr. Yancey Flemings cardiology who evaluated the patient.  Dr. Saralyn Pilar is aware planning to put in the permanent pacemaker in about 3 days since she had already taken Plavix today.  We will hold this.  She remains asymptomatic while in the ED.  Labs are overall reassuring, troponin just mildly above the upper limit of normal at 18.  The hospitalist for admission ?  ? ? ?  FINAL CLINICAL IMPRESSION(S) / ED DIAGNOSES  ? ?Final diagnoses:  ?AV block, 3rd degree (HCC)  ? ? ? ?Rx / DC Orders  ? ?ED Discharge Orders   ? ? None  ? ?  ? ? ? ?Note:  This document was prepared using Dragon voice recognition software and may include unintentional dictation errors. ?  ?Rada Hay, MD ?12/03/21 1544 ? ?

## 2021-12-04 ENCOUNTER — Encounter: Payer: Self-pay | Admitting: Internal Medicine

## 2021-12-04 DIAGNOSIS — R001 Bradycardia, unspecified: Secondary | ICD-10-CM

## 2021-12-04 LAB — GLUCOSE, CAPILLARY
Glucose-Capillary: 98 mg/dL (ref 70–99)
Glucose-Capillary: 100 mg/dL — ABNORMAL HIGH (ref 70–99)
Glucose-Capillary: 172 mg/dL — ABNORMAL HIGH (ref 70–99)
Glucose-Capillary: 161 mg/dL — ABNORMAL HIGH (ref 70–99)
Glucose-Capillary: 123 mg/dL — ABNORMAL HIGH (ref 70–99)
Glucose-Capillary: 123 mg/dL — ABNORMAL HIGH (ref 70–99)

## 2021-12-04 LAB — LIPID PANEL
Cholesterol: 110 mg/dL (ref 0–200)
HDL: 61 mg/dL (ref >40)
LDL Cholesterol: 39 mg/dL (ref 0–99)
Total CHOL/HDL Ratio: 1.8 RATIO
Triglycerides: 49 mg/dL (ref <150)
VLDL: 10 mg/dL (ref 0–40)

## 2021-12-04 NOTE — Progress Notes (Signed)
Pt A&OX4. HR sustaining mid 30s to 40s. Asymptomatic. Diet ordered, per cardiology plan for pacemaker Friday. PRN Norco for chronic pain. Will continue to monitor. ?

## 2021-12-04 NOTE — Progress Notes (Addendum)
? ? ? ?Progress Note  ? ? ?[name]  XBD:[id number] DOB: 02/16/1946  DOA: 12/03/2021 ?PCP: Venia Carbon, MD  ? ? ? ? ?Brief Narrative:  ? ? ?Medical records reviewed and are as summarized below: ? ?[name] is a 76 y.o. female with medical history significant of hypertension, hyperlipidemia, diet-controlled diabetes, memory loss, IBS, chronic diastolic CHF, STM-1D, chronic pain, who presented to the hospital with bradycardia. ? ?She was found to have high degree AV block. ? ? ? ?Assessment/Plan:  ? ?Principal Problem: ?  Complete heart block ([place]) ?Active Problems: ?  Bradycardia ?  Pain syndrome, chronic ?  Stroke [facility]) ?  Chronic diastolic CHF (congestive heart failure) ([place]) ?  Stage 3b chronic kidney disease ([place]) ?  Type II diabetes mellitus with renal manifestations ([place]) ?  HLD (hyperlipidemia) ?  Depression ?  Memory loss ?  Elevated troponin ? ? ? ?Body mass index is 34.28 kg/m?.  (Obesity) ? ?Type II second-degree AV block: Donepezil was held on admission.  Heart rate is in the upper 30s and low 40s on telemetry.  Plan for permanent pacemaker placement sometime this week.  Plavix has been held for surgery.  Follow-up with cardiologist. ? ?Chronic diastolic CHF: Compensated. ? ?History of stroke: Plavix has been held ? ?Mild elevated troponin most likely from demand ischemia. ? ?Other comorbidities include CKD stage IIIb, hypertension, type II DM, hyperlipidemia, depression, memory impairment ? ?Diet Order   ? ?       ?  Diet heart healthy/carb modified Room service appropriate? Yes; Fluid consistency: Thin  Diet effective now       ?  ? ?  ?  ? ?  ? ? ?Full ?Consultants: ?Cardiologist ? ?Procedures: ?None ? ? ? ?Medications:  ? ? ARIPiprazole  2 mg Oral QHS  ? vitamin C  500 mg Oral Daily  ? aspirin EC  81 mg Oral Daily  ? atorvastatin  80 mg Oral QHS  ? Chlorhexidine Gluconate Cloth  6 each Topical Q0600  ? cholecalciferol  1,000 Units Oral Daily  ? DULoxetine  60 mg Oral Daily  ?  gabapentin  600 mg Oral BID  ? And  ? gabapentin  900 mg Oral QHS  ? heparin injection (subcutaneous)  5,000 Units Subcutaneous Q8H  ? hydrALAZINE  25 mg Oral BID  ? lisinopril  20 mg Oral Daily  ? And  ? hydrochlorothiazide  25 mg Oral Daily  ? insulin aspart  0-5 Units Subcutaneous QHS  ? insulin aspart  0-9 Units Subcutaneous TID WC  ? meloxicam  15 mg Oral Daily  ? multivitamin with minerals  1 tablet Oral Daily  ? omega-3 acid ethyl esters  1 g Oral Daily  ? ?Continuous Infusions: ? ? ?Anti-infectives (From admission, onward)  ? ? None  ? ?  ? ? ? ? ? ? ? ? ? ?Family Communication/Anticipated D/C date and plan/Code Status  ? ?DVT prophylaxis: heparin injection 5,000 Units Start: 12/03/21 2200 ? ? ?  Code Status: Full Code ? ?Family Communication: None ?Disposition Plan: Plan to discharge home after pacemaker placement ? ? ?Status is: Inpatient ?Remains inpatient appropriate because: Awaiting pacemaker placement ? ? ? ? ? ? ?Subjective:  ? ?Interval events noted.  No chest pain, shortness of breath, palpitations, dizziness ? ?Objective:  ? ? ?Vitals:  ? 12/04/21 0800 12/04/21 0900 12/04/21 1000 12/04/21 1100  ?BP: (!) 176/77 (!) 158/66 (!) 160/65 (!) 130/53  ?Pulse: (!) 34 Marland Kitchen)  35 (!) 37 (!) 41  ?Resp: '10 17 17 '$ (!) 22  ?Temp: 97.6 ?F (36.4 ?C)     ?TempSrc: Oral     ?SpO2: 97% 98% 95% 91%  ?Weight:      ?Height:      ? ?No data found. ? ? ?Intake/Output Summary (Last 24 hours) at 12/04/2021 1209 ?Last data filed at 12/04/2021 1052 ?Gross per 24 hour  ?Intake --  ?Output 1500 ml  ?Net -1500 ml  ? ?Filed Weights  ? 12/03/21 1433 12/03/21 1700 12/04/21 0500  ?Weight: 81.6 kg 84 kg 82.3 kg  ? ? ?Exam: ? ?GEN: NAD ?SKIN: No rash ?EYES: EOMI ?ENT: MMM ?CV: RRR ?PULM: CTA B ?ABD: soft, obese, NT, +BS ?CNS: AAO x 3, non focal ?EXT: Trace bilateral leg edema, no erythema or tenderness ? ? ? ?  ? ? ?Data Reviewed:  ? ?I have personally reviewed following labs and imaging studies: ? ?Labs: ?Labs show the following:  ? ?Basic  Metabolic Panel: ?Recent Labs  ?Lab 12/03/21 ?1444 12/03/21 ?2249  ?NA 138 137  ?K 4.3 3.7  ?CL 103 106  ?CO2 25 26  ?GLUCOSE 143* 213*  ?BUN 30* 28*  ?CREATININE 1.20* 1.14*  ?CALCIUM 9.0 8.4*  ?MG 2.2 2.2  ? ?GFR ?Estimated Creatinine Clearance: 42.7 mL/min (A) (by C-G formula based on SCr of 1.14 mg/dL (H)). ?Liver Function Tests: ?No results for input(s): AST, ALT, ALKPHOS, BILITOT, PROT, ALBUMIN in the last 168 hours. ?No results for input(s): LIPASE, AMYLASE in the last 168 hours. ?No results for input(s): AMMONIA in the last 168 hours. ?Coagulation profile ?Recent Labs  ?Lab 12/03/21 ?1444  ?INR 1.0  ? ? ?CBC: ?Recent Labs  ?Lab 12/03/21 ?1444 12/03/21 ?2249  ?WBC 10.5 8.3  ?NEUTROABS 8.1*  --   ?HGB 14.4 12.5  ?HCT 44.5 38.1  ?MCV 98.0 96.0  ?PLT 262 233  ? ?Cardiac Enzymes: ?No results for input(s): CKTOTAL, CKMB, CKMBINDEX, TROPONINI in the last 168 hours. ?BNP (last 3 results) ?No results for input(s): PROBNP in the last 8760 hours. ?CBG: ?Recent Labs  ?Lab 12/03/21 ?1659 12/03/21 ?2013 12/03/21 ?2215 12/04/21 ?2992 12/04/21 ?1121  ?GLUCAP 98 266* 265* 100* 172*  ? ?D-Dimer: ?No results for input(s): DDIMER in the last 72 hours. ?Hgb A1c: ?Recent Labs  ?  12/03/21 ?1729  ?HGBA1C 6.3*  ? ?Lipid Profile: ?Recent Labs  ?  12/04/21 ?4268  ?CHOL 110  ?HDL 61  ?[place] 39  ?TRIG 49  ?CHOLHDL 1.8  ? ?Thyroid function studies: ?Recent Labs  ?  12/03/21 ?1444  ?TSH 1.903  ? ?Anemia work up: ?No results for input(s): VITAMINB12, FOLATE, FERRITIN, TIBC, IRON, RETICCTPCT in the last 72 hours. ?Sepsis Labs: ?Recent Labs  ?Lab 12/03/21 ?1444 12/03/21 ?2249  ?WBC 10.5 8.3  ? ? ?Microbiology ?Recent Results (from the past 240 hour(s))  ?MRSA Next Gen by PCR, Nasal     Status: None  ? Collection Time: 12/03/21  4:58 PM  ? Specimen: Nasal Mucosa; Nasal Swab  ?Result Value Ref Range Status  ? MRSA by PCR Next Gen NOT DETECTED NOT DETECTED Final  ?  Comment: (NOTE) ?The GeneXpert MRSA Assay (FDA approved for NASAL specimens  only), ?is one component of a comprehensive MRSA colonization surveillance ?program. It is not intended to diagnose MRSA infection nor to guide ?or monitor treatment for MRSA infections. ?Test performance is not FDA approved in patients less than 2 years ?old. ?Performed at [facility], [place], ?[place]  [id number] ?  ? ? ?Procedures and diagnostic studies: ? ?DG Chest Portable 1 View ? ?Result Date: 12/03/2021 ?CLINICAL DATA:  heart block EXAM: PORTABLE CHEST 1 VIEW.  Patient is slightly rotated. COMPARISON:  None. FINDINGS: Cardiac paddles overlie the chest. The cardiac silhouette is enlarged in caliber. The heart and mediastinal contours are within normal limits. Linear atelectasis versus scarring of the lingula. No focal consolidation. No pulmonary edema. No pleural effusion. No pneumothorax. No acute osseous abnormality. IMPRESSION: Enlarged cardiac silhouette with no active cardiopulmonary disease. Electronically Signed   By: Iven Finn M.D.   On: 12/03/2021 15:09   ? ? ? ? ? ? ? ? ? ? ? ? LOS: 1 day  ? ?[name]  ?[facility]  ? ?Pager on [email or web address]. If 7PM-7AM, please contact night-coverage at [email or web address] ? ? ? ? ?12/04/2021, 12:09 PM  ? ? ? ? ? ? ? ? ? ?

## 2021-12-04 NOTE — Progress Notes (Signed)
SUBJECTIVE: This is a 76 year old white female with a past medical history of arthritis hypertension diabetes who was seen by physiology and [place] for back pain and was found to be bradycardic and was sent to the emergency room for evaluation.  She denies dizziness, palpitation, fluttering, chest pain, or shortness of breath ? ? ? ? ?Vitals:  ? 12/04/21 0600 12/04/21 0700 12/04/21 0730 12/04/21 0800  ?BP: (!) 153/65 (!) 162/64  (!) 176/77  ?Pulse: (!) 34 (!) 31 (!) 34 (!) 34  ?Resp: '18 17 12 10  '$ ?Temp:    97.6 ?F (36.4 ?C)  ?TempSrc:      ?SpO2: 96% (!) 89% 96% 97%  ?Weight:      ?Height:      ? ? ?Intake/Output Summary (Last 24 hours) at 12/04/2021 0834 ?Last data filed at 12/04/2021 0800 ?Gross per 24 hour  ?Intake --  ?Output 1150 ml  ?Net -1150 ml  ? ? ?LABS: ?Basic Metabolic Panel: ?Recent Labs  ?  12/03/21 ?1444 12/03/21 ?2249  ?NA 138 137  ?K 4.3 3.7  ?CL 103 106  ?CO2 25 26  ?GLUCOSE 143* 213*  ?BUN 30* 28*  ?CREATININE 1.20* 1.14*  ?CALCIUM 9.0 8.4*  ?MG 2.2 2.2  ? ?Liver Function Tests: ?No results for input(s): AST, ALT, ALKPHOS, BILITOT, PROT, ALBUMIN in the last 72 hours. ?No results for input(s): LIPASE, AMYLASE in the last 72 hours. ?CBC: ?Recent Labs  ?  12/03/21 ?1444 12/03/21 ?2249  ?WBC 10.5 8.3  ?NEUTROABS 8.1*  --   ?HGB 14.4 12.5  ?HCT 44.5 38.1  ?MCV 98.0 96.0  ?PLT 262 233  ? ?Cardiac Enzymes: ?No results for input(s): CKTOTAL, CKMB, CKMBINDEX, TROPONINI in the last 72 hours. ?BNP: ?Invalid input(s): POCBNP ?D-Dimer: ?No results for input(s): DDIMER in the last 72 hours. ?Hemoglobin A1C: ?Recent Labs  ?  12/03/21 ?1729  ?HGBA1C 6.3*  ? ?Fasting Lipid Panel: ?Recent Labs  ?  12/04/21 ?0539  ?CHOL 110  ?HDL 61  ?[place] 39  ?TRIG 49  ?CHOLHDL 1.8  ? ?Thyroid Function Tests: ?Recent Labs  ?  12/03/21 ?1444  ?TSH 1.903  ? ?Anemia Panel: ?No results for input(s): VITAMINB12, FOLATE, FERRITIN, TIBC, IRON, RETICCTPCT in the last 72 hours. ? ? ?PHYSICAL EXAM ?General: Well developed, well nourished, in no  acute distress ?HEENT:  Normocephalic and atramatic ?Neck:  No JVD.  ?Lungs: Clear bilaterally to auscultation and percussion. ?Heart: HRRR . Normal S1 and S2 without gallops or murmurs.  ?Abdomen: Bowel sounds are positive, abdomen soft and non-tender  ?Msk:  Back normal, normal gait. Normal strength and tone for age. ?Extremities: No clubbing, cyanosis or edema.   ?Neuro: Alert and oriented X 3. ?Psych:  Good affect, responds appropriately ? ?TELEMETRY: Sinus rhythm 39 bpm with type II second-degree AV block ? ?ASSESSMENT AND PLAN: Asymptomatic type II second-degree AV block with very slow ventricular response rate 36-39 patient is not on any beta-blockers or Cardizem or digoxin.  Patient was on Norvasc and Plavix which has been stopped.  Patient will need permanent pacemaker while she is here. Dr. Raliegh Scarlet at [place] to do that. ?  ? ?Principal Problem: ?  Complete heart block ([place]) ?Active Problems: ?  Pain syndrome, chronic ?  Stroke [facility]) ?  Chronic diastolic CHF (congestive heart failure) ([place]) ?  Stage 3b chronic kidney disease ([place]) ?  Type II diabetes mellitus with renal manifestations ([place]) ?  HLD (hyperlipidemia) ?  Depression ?  Memory loss ?  Bradycardia ?  Elevated troponin ?  ? ?[employer], FNP-C ?12/04/2021 ?8:34 AM ? ? ? ?    ?

## 2021-12-04 NOTE — Plan of Care (Signed)
HR 30s-40s. 2 degree/3rd degree AV block. Asymptomatic. SBP 140s. Reports 5/10 chronic back pain; scheduled gabapentin given. Remains on RA. No acute events this shift. Plan for pacemaker Friday. ?Problem: Education: ?Goal: Knowledge of General Education information will improve ?Description: Including pain rating scale, medication(s)/side effects and non-pharmacologic comfort measures ?Outcome: Progressing ?  ?Problem: Health Behavior/Discharge Planning: ?Goal: Ability to manage health-related needs will improve ?Outcome: Progressing ?  ?Problem: Clinical Measurements: ?Goal: Ability to maintain clinical measurements within normal limits will improve ?Outcome: Progressing ?Goal: Will remain free from infection ?Outcome: Progressing ?Goal: Diagnostic test results will improve ?Outcome: Progressing ?Goal: Respiratory complications will improve ?Outcome: Progressing ?Goal: Cardiovascular complication will be avoided ?Outcome: Progressing ?  ?Problem: Activity: ?Goal: Risk for activity intolerance will decrease ?Outcome: Progressing ?  ?Problem: Nutrition: ?Goal: Adequate nutrition will be maintained ?Outcome: Progressing ?  ?Problem: Coping: ?Goal: Level of anxiety will decrease ?Outcome: Progressing ?  ?Problem: Elimination: ?Goal: Will not experience complications related to bowel motility ?Outcome: Progressing ?Goal: Will not experience complications related to urinary retention ?Outcome: Progressing ?  ?Problem: Pain Managment: ?Goal: General experience of comfort will improve ?Outcome: Progressing ?  ?Problem: Safety: ?Goal: Ability to remain free from injury will improve ?Outcome: Progressing ?  ?Problem: Skin Integrity: ?Goal: Risk for impaired skin integrity will decrease ?Outcome: Progressing ?  ?

## 2021-12-05 LAB — GLUCOSE, CAPILLARY
Glucose-Capillary: 98 mg/dL (ref 70–99)
Glucose-Capillary: 136 mg/dL — ABNORMAL HIGH (ref 70–99)
Glucose-Capillary: 113 mg/dL — ABNORMAL HIGH (ref 70–99)
Glucose-Capillary: 180 mg/dL — ABNORMAL HIGH (ref 70–99)

## 2021-12-05 MED ORDER — AMLODIPINE BESYLATE 5 MG PO TABS
5.0000 mg | ORAL_TABLET | Freq: Every day | ORAL | Status: DC
  Administered 2021-12-05 – 2021-12-07 (×3): 5 mg via ORAL
  Filled 2021-12-05 (×3): qty 1

## 2021-12-05 MED ORDER — CEFAZOLIN SODIUM-DEXTROSE 2-4 GM/100ML-% IV SOLN
2.0000 g | INTRAVENOUS | Status: DC
  Filled 2021-12-05: qty 100

## 2021-12-05 MED ORDER — SODIUM CHLORIDE 0.9 % IV SOLN: INTRAVENOUS | Status: DC

## 2021-12-05 MED ORDER — SODIUM CHLORIDE 0.45 % IV SOLN: INTRAVENOUS | Status: DC

## 2021-12-05 NOTE — Progress Notes (Signed)
? ? ? ?Progress Note  ? ? ?[name]  QIH:[id number] DOB: 1946/01/12  DOA: 12/03/2021 ?PCP: Venia Carbon, MD  ? ? ? ? ?Brief Narrative:  ? ? ?Medical records reviewed and are as summarized below: ? ?[name] is a 76 y.o. female with medical history significant of hypertension, hyperlipidemia, diet-controlled diabetes, memory loss, IBS, chronic diastolic CHF, OVF-6E, chronic pain, who presented to the hospital with bradycardia. ? ?She was found to have high degree AV block. ? ? ? ?Assessment/Plan:  ? ?Principal Problem: ?  Complete heart block ([place]) ?Active Problems: ?  Bradycardia ?  Pain syndrome, chronic ?  Stroke [facility]) ?  Chronic diastolic CHF (congestive heart failure) ([place]) ?  Stage 3b chronic kidney disease ([place]) ?  Type II diabetes mellitus with renal manifestations ([place]) ?  HLD (hyperlipidemia) ?  Depression ?  Memory loss ?  Elevated troponin ? ? ? ?Body mass index is 35.24 kg/m?.  (Obesity) ? ?Type II second-degree AV block: Aricept was held on admission.  Heart rate remains in the 30s.  Plan for permanent pacemaker placement tomorrow.   ? ?Chronic diastolic CHF: Compensated. ? ?History of stroke: Plavix has been held since admission. ? ?Mild elevated troponin most likely from demand ischemia. ? ?Other comorbidities include CKD stage IIIb, hypertension, type II DM, hyperlipidemia, depression, memory impairment ? ?Diet Order   ? ?       ?  Diet NPO time specified Except for: Sips with Meds  Diet effective midnight       ?  ?  Diet heart healthy/carb modified Room service appropriate? Yes; Fluid consistency: Thin  Diet effective now       ?  ? ?  ?  ? ?  ? ? ?Consultants: ?Cardiologist ? ?Procedures: ?None ? ? ? ?Medications:  ? ? amLODipine  5 mg Oral Daily  ? ARIPiprazole  2 mg Oral QHS  ? vitamin C  500 mg Oral Daily  ? aspirin EC  81 mg Oral Daily  ? atorvastatin  80 mg Oral QHS  ? Chlorhexidine Gluconate Cloth  6 each Topical Q0600  ? cholecalciferol  1,000 Units Oral Daily  ? DULoxetine   60 mg Oral Daily  ? gabapentin  600 mg Oral BID  ? And  ? gabapentin  900 mg Oral QHS  ? heparin injection (subcutaneous)  5,000 Units Subcutaneous Q8H  ? hydrALAZINE  25 mg Oral BID  ? lisinopril  20 mg Oral Daily  ? And  ? hydrochlorothiazide  25 mg Oral Daily  ? insulin aspart  0-5 Units Subcutaneous QHS  ? insulin aspart  0-9 Units Subcutaneous TID WC  ? meloxicam  15 mg Oral Daily  ? multivitamin with minerals  1 tablet Oral Daily  ? omega-3 acid ethyl esters  1 g Oral Daily  ? ?Continuous Infusions: ? ? ?Anti-infectives (From admission, onward)  ? ? None  ? ?  ? ? ? ? ? ? ? ? ? ?Family Communication/Anticipated D/C date and plan/Code Status  ? ?DVT prophylaxis: heparin injection 5,000 Units Start: 12/03/21 2200 ? ? ?  Code Status: Full Code ? ?Family Communication: None ?Disposition Plan: Plan to discharge home after pacemaker placement ? ? ?Status is: Inpatient ?Remains inpatient appropriate because: Awaiting pacemaker placement ? ? ? ? ? ? ?Subjective:  ? ?Interval events noted.  No chest pain, shortness of breath, dizziness or palpitations.  Telemetry still shows heart rate in the 30s. ? ?Objective:  ? ? ?  Vitals:  ? 12/05/21 0800 12/05/21 0900 12/05/21 1000 12/05/21 1100  ?BP: (!) 176/69 (!) 188/70 (!) 150/56 (!) 159/63  ?Pulse: (!) 34 (!) 36 (!) 37 (!) 36  ?Resp: '13 13 15 20  '$ ?Temp: 98.6 ?F (37 ?C)     ?TempSrc: Oral     ?SpO2: 95% 93% 98% 97%  ?Weight:      ?Height:      ? ?No data found. ? ? ?Intake/Output Summary (Last 24 hours) at 12/05/2021 1223 ?Last data filed at 12/05/2021 1000 ?Gross per 24 hour  ?Intake 480 ml  ?Output 1625 ml  ?Net -1145 ml  ? ?Filed Weights  ? 12/03/21 1700 12/04/21 0500 12/05/21 0500  ?Weight: 84 kg 82.3 kg 84.6 kg  ? ? ?Exam: ? ?GEN: NAD ?SKIN: No rash ?EYES: EOMI ?ENT: MMM ?CV: RRR ?PULM: CTA B ?ABD: soft, obese, NT, +BS ?CNS: AAO x 3, non focal ?EXT: Trace bilateral leg edema ? ? ? ? ? ?  ? ? ?Data Reviewed:  ? ?I have personally reviewed following labs and imaging  studies: ? ?Labs: ?Labs show the following:  ? ?Basic Metabolic Panel: ?Recent Labs  ?Lab 12/03/21 ?1444 12/03/21 ?2249  ?NA 138 137  ?K 4.3 3.7  ?CL 103 106  ?CO2 25 26  ?GLUCOSE 143* 213*  ?BUN 30* 28*  ?CREATININE 1.20* 1.14*  ?CALCIUM 9.0 8.4*  ?MG 2.2 2.2  ? ?GFR ?Estimated Creatinine Clearance: 43.4 mL/min (A) (by C-G formula based on SCr of 1.14 mg/dL (H)). ?Liver Function Tests: ?No results for input(s): AST, ALT, ALKPHOS, BILITOT, PROT, ALBUMIN in the last 168 hours. ?No results for input(s): LIPASE, AMYLASE in the last 168 hours. ?No results for input(s): AMMONIA in the last 168 hours. ?Coagulation profile ?Recent Labs  ?Lab 12/03/21 ?1444  ?INR 1.0  ? ? ?CBC: ?Recent Labs  ?Lab 12/03/21 ?1444 12/03/21 ?2249  ?WBC 10.5 8.3  ?NEUTROABS 8.1*  --   ?HGB 14.4 12.5  ?HCT 44.5 38.1  ?MCV 98.0 96.0  ?PLT 262 233  ? ?Cardiac Enzymes: ?No results for input(s): CKTOTAL, CKMB, CKMBINDEX, TROPONINI in the last 168 hours. ?BNP (last 3 results) ?No results for input(s): PROBNP in the last 8760 hours. ?CBG: ?Recent Labs  ?Lab 12/04/21 ?1523 12/04/21 ?1737 12/04/21 ?2216 12/05/21 ?0719 12/05/21 ?1141  ?GLUCAP 161* 123* 123* 98 136*  ? ?D-Dimer: ?No results for input(s): DDIMER in the last 72 hours. ?Hgb A1c: ?Recent Labs  ?  12/03/21 ?1729  ?HGBA1C 6.3*  ? ?Lipid Profile: ?Recent Labs  ?  12/04/21 ?4259  ?CHOL 110  ?HDL 61  ?[place] 39  ?TRIG 49  ?CHOLHDL 1.8  ? ?Thyroid function studies: ?Recent Labs  ?  12/03/21 ?1444  ?TSH 1.903  ? ?Anemia work up: ?No results for input(s): VITAMINB12, FOLATE, FERRITIN, TIBC, IRON, RETICCTPCT in the last 72 hours. ?Sepsis Labs: ?Recent Labs  ?Lab 12/03/21 ?1444 12/03/21 ?2249  ?WBC 10.5 8.3  ? ? ?Microbiology ?Recent Results (from the past 240 hour(s))  ?MRSA Next Gen by PCR, Nasal     Status: None  ? Collection Time: 12/03/21  4:58 PM  ? Specimen: Nasal Mucosa; Nasal Swab  ?Result Value Ref Range Status  ? MRSA by PCR Next Gen NOT DETECTED NOT DETECTED Final  ?  Comment: (NOTE) ?The  GeneXpert MRSA Assay (FDA approved for NASAL specimens only), ?is one component of a comprehensive MRSA colonization surveillance ?program. It is not intended to diagnose MRSA infection nor to guide ?or monitor treatment for MRSA infections. ?  Test performance is not FDA approved in patients less than 2 years ?old. ?Performed at [facility], [place], ?[place] [address] ?  ? ? ?Procedures and diagnostic studies: ? ?DG Chest Portable 1 View ? ?Result Date: 12/03/2021 ?CLINICAL DATA:  heart block EXAM: PORTABLE CHEST 1 VIEW.  Patient is slightly rotated. COMPARISON:  None. FINDINGS: Cardiac paddles overlie the chest. The cardiac silhouette is enlarged in caliber. The heart and mediastinal contours are within normal limits. Linear atelectasis versus scarring of the lingula. No focal consolidation. No pulmonary edema. No pleural effusion. No pneumothorax. No acute osseous abnormality. IMPRESSION: Enlarged cardiac silhouette with no active cardiopulmonary disease. Electronically Signed   By: Iven Finn M.D.   On: 12/03/2021 15:09   ? ? ? ? ? ? ? ? ? ? ? ? LOS: 2 days  ? ?Hamish Banks  ?[facility]  ? ?Pager on [email or web address]. If 7PM-7AM, please contact night-coverage at [email or web address] ? ? ? ? ?12/05/2021, 12:23 PM  ? ? ? ? ? ? ? ? ? ?

## 2021-12-05 NOTE — TOC Initial Note (Signed)
Transition of Care (TOC) - Initial/Assessment Note  ? ? ?Patient Details  ?Name: [name] ?MRN: [id number] ?Date of Birth: Sep 11, 1945 ? ?Transition of Care (TOC) CM/SW Contact:    ?Shelbie Hutching, RN ?Phone Number: ?12/05/2021, 3:40 PM ? ?Clinical Narrative:                 ?Patient admitted to the hospital with complete heart block scheduled for pacemaker at 0730 in the morning. ?RNCM met with patient at the bedside.  She is from home where she lives alone and is independent.  Patient does not drive she uses [employer] to get to appointments and run errands.  Patient reports that at discharge she will get an [place] home.   ?Patient does have people that can come and check on her once discharged from the hospital.   ? ?TOC will follow up with patient tomorrow after pacemaker placed.   ? ?Expected Discharge Plan: Home/Self Care ?Barriers to Discharge: Continued Medical Work up ? ? ?Patient Goals and CMS Choice ?Patient states their goals for this hospitalization and ongoing recovery are:: to get back home - getting a pacemaker tomorrow ?  ?  ? ?Expected Discharge Plan and Services ?Expected Discharge Plan: Home/Self Care ?  ?Discharge Planning Services: CM Consult ?  ?Living arrangements for the past 2 months: Mobile Home ?                ?DME Arranged: N/A ?DME Agency: NA ?  ?  ?  ?HH Arranged: NA ?[place] Agency: NA ?  ?  ?  ? ?Prior Living Arrangements/Services ?Living arrangements for the past 2 months: Mobile Home ?Lives with:: Self ?Patient language and need for interpreter reviewed:: Yes ?Do you feel safe going back to the place where you live?: Yes      ?Need for Family Participation in Patient Care: Yes (Comment) ?Care giver support system in place?: Yes (comment) ?  ?Criminal Activity/Legal Involvement Pertinent to Current Situation/Hospitalization: No - Comment as needed ? ?Activities of Daily Living ?  ?  ? ?Permission Sought/Granted ?  ?Permission granted to share information with : No ?   ?   ?   ?    ? ?Emotional Assessment ?Appearance:: Appears stated age ?Attitude/Demeanor/Rapport: Engaged ?Affect (typically observed): Accepting ?Orientation: : Oriented to Self, Oriented to Place, Oriented to  Time, Oriented to Situation ?Alcohol / Substance Use: Not Applicable ?Psych Involvement: No (comment) ? ?Admission diagnosis:  Bradycardia [R00.1] ?AV block, 3rd degree (HCC) [I44.2] ?Complete heart block ([place]) [I44.2] ?Patient Active Problem List  ? Diagnosis Date Noted  ? Complete heart block ([place]) 12/03/2021  ? HLD (hyperlipidemia) 12/03/2021  ? Depression 12/03/2021  ? Memory loss 12/03/2021  ? Bradycardia 12/03/2021  ? Elevated troponin 12/03/2021  ? Bursitis of left elbow 09/18/2021  ? [place] intertrigo 03/29/2021  ? History of stroke 07/04/2020  ? Stroke ([place]) 06/10/2020  ? Chronic diastolic CHF (congestive heart failure) ([place]) 06/10/2020  ? Stage 3b chronic kidney disease ([place]) 06/10/2020  ? Type II diabetes mellitus with renal manifestations ([place]) 06/10/2020  ? Abnormal MRI   ? Shoulder tendonitis, left 07/25/2019  ? Chronic narcotic dependence ([place]) 07/04/2019  ? Osteoarthritis of right knee 11/16/2018  ? Urge incontinence 10/07/2018  ? Mycotic toenails 10/07/2018  ? Venous insufficiency 10/07/2018  ? Unilateral primary osteoarthritis, left hip 01/08/2018  ? Status post total replacement of left hip 01/08/2018  ? Pain syndrome, chronic 03/13/2017  ? Facet arthropathy, lumbar 03/13/2017  ? MDD (major depressive  disorder), single episode 05/07/2016  ? Advance directive discussed with patient 01/15/2016  ? Spinal stenosis of lumbar region with neurogenic claudication 11/07/2015  ? Other intervertebral disc degeneration, lumbar region 06/01/2015  ? Chronic back pain 03/29/2015  ? Preventative health care 01/10/2015  ? Osteoarthritis, hip, bilateral 11/28/2014  ? Peripheral neuropathy 11/28/2014  ? Type 2 diabetes, controlled, with neuropathy ([place]) 01/20/2014  ? Episodic mood disorder ([place]) 01/20/2014  ? MCI  (mild cognitive impairment) 12/26/2013  ? IBS (irritable bowel syndrome) 12/27/2012  ? Essential hypertension, benign 08/06/2010  ? ?PCP:  Venia Carbon, MD ?Pharmacy:   ?[name] [place] - [id number]?Phone: [phone]: [phone]? ? ? ? ?Social Determinants of Health (SDOH) Interventions ?  ? ?Readmission Risk Interventions ? ?  12/05/2021  ?  3:32 PM  ?Readmission Risk Prevention Plan  ?Transportation Screening Complete  ?PCP or Specialist Appt within 5-7 Days Complete  ?Home Care Screening Complete  ?Medication Review (RN CM) Complete  ? ? ? ?

## 2021-12-05 NOTE — Plan of Care (Signed)
HR 30s. BP 140s. Complains 7/10 back pain; PRN norco given x1. Fluids started per EP lab orders. Report given to EP lab RN for pacemaker.  ?Problem: Education: ?Goal: Knowledge of General Education information will improve ?Description: Including pain rating scale, medication(s)/side effects and non-pharmacologic comfort measures ?Outcome: Progressing ?  ?Problem: Health Behavior/Discharge Planning: ?Goal: Ability to manage health-related needs will improve ?Outcome: Progressing ?  ?Problem: Clinical Measurements: ?Goal: Ability to maintain clinical measurements within normal limits will improve ?Outcome: Progressing ?Goal: Will remain free from infection ?Outcome: Progressing ?Goal: Diagnostic test results will improve ?Outcome: Progressing ?Goal: Respiratory complications will improve ?Outcome: Progressing ?Goal: Cardiovascular complication will be avoided ?Outcome: Progressing ?  ?Problem: Activity: ?Goal: Risk for activity intolerance will decrease ?Outcome: Progressing ?  ?Problem: Nutrition: ?Goal: Adequate nutrition will be maintained ?Outcome: Progressing ?  ?Problem: Coping: ?Goal: Level of anxiety will decrease ?Outcome: Progressing ?  ?Problem: Elimination: ?Goal: Will not experience complications related to bowel motility ?Outcome: Progressing ?Goal: Will not experience complications related to urinary retention ?Outcome: Progressing ?  ?Problem: Pain Managment: ?Goal: General experience of comfort will improve ?Outcome: Progressing ?  ?Problem: Safety: ?Goal: Ability to remain free from injury will improve ?Outcome: Progressing ?  ?Problem: Skin Integrity: ?Goal: Risk for impaired skin integrity will decrease ?Outcome: Progressing ?  ?

## 2021-12-05 NOTE — Progress Notes (Signed)
SUBJECTIVE:No chest pain palpitation ? ? ?Vitals:  ? 12/05/21 0500 12/05/21 0600 12/05/21 0718 12/05/21 0800  ?BP: (!) 148/66 (!) 152/72  (!) 176/69  ?Pulse: (!) 32  (!) 35 (!) 34  ?Resp: '19 11 17 13  '$ ?Temp:    98.6 ?F (37 ?C)  ?TempSrc:    Oral  ?SpO2: 95%  94% 95%  ?Weight: 84.6 kg     ?Height:      ? ? ?Intake/Output Summary (Last 24 hours) at 12/05/2021 0959 ?Last data filed at 12/05/2021 0800 ?Gross per 24 hour  ?Intake 480 ml  ?Output 1775 ml  ?Net -1295 ml  ? ? ?LABS: ?Basic Metabolic Panel: ?Recent Labs  ?  12/03/21 ?1444 12/03/21 ?2249  ?NA 138 137  ?K 4.3 3.7  ?CL 103 106  ?CO2 25 26  ?GLUCOSE 143* 213*  ?BUN 30* 28*  ?CREATININE 1.20* 1.14*  ?CALCIUM 9.0 8.4*  ?MG 2.2 2.2  ? ?Liver Function Tests: ?No results for input(s): AST, ALT, ALKPHOS, BILITOT, PROT, ALBUMIN in the last 72 hours. ?No results for input(s): LIPASE, AMYLASE in the last 72 hours. ?CBC: ?Recent Labs  ?  12/03/21 ?1444 12/03/21 ?2249  ?WBC 10.5 8.3  ?NEUTROABS 8.1*  --   ?HGB 14.4 12.5  ?HCT 44.5 38.1  ?MCV 98.0 96.0  ?PLT 262 233  ? ?Cardiac Enzymes: ?No results for input(s): CKTOTAL, CKMB, CKMBINDEX, TROPONINI in the last 72 hours. ?BNP: ?Invalid input(s): POCBNP ?D-Dimer: ?No results for input(s): DDIMER in the last 72 hours. ?Hemoglobin A1C: ?Recent Labs  ?  12/03/21 ?1729  ?HGBA1C 6.3*  ? ?Fasting Lipid Panel: ?Recent Labs  ?  12/04/21 ?4332  ?CHOL 110  ?HDL 61  ?[place] 39  ?TRIG 49  ?CHOLHDL 1.8  ? ?Thyroid Function Tests: ?Recent Labs  ?  12/03/21 ?1444  ?TSH 1.903  ? ?Anemia Panel: ?No results for input(s): VITAMINB12, FOLATE, FERRITIN, TIBC, IRON, RETICCTPCT in the last 72 hours. ? ? ?PHYSICAL EXAM ?General: Well developed, well nourished, in no acute distress ?HEENT:  Normocephalic and atramatic ?Neck:  No JVD.  ?Lungs: Clear bilaterally to auscultation and percussion. ?Heart: HRRR . Normal S1 and S2 without gallops or murmurs.  ?Abdomen: Bowel sounds are positive, abdomen soft and non-tender  ?Msk:  Back normal, normal gait.  Normal strength and tone for age. ?Extremities: No clubbing, cyanosis or edema.   ?Neuro: Alert and oriented X 3. ?Psych:  Good affect, responds appropriately ? ?TELEMETRY: Complete AV block complete AV block with intermittent type II second-degree AV block.   ? ?ASSESSMENT AND PLAN: Complete AV block with intermittent type II second-degree AV block.  Since patient has high-grade AV block Dr. Donette Larry is going to set up pacemaker tomorrow.  Patient had 2 wait because of Plavix being given up till Monday. ? ?Principal Problem: ?  Complete heart block ([place]) ?Active Problems: ?  Pain syndrome, chronic ?  Stroke [facility]) ?  Chronic diastolic CHF (congestive heart failure) ([place]) ?  Stage 3b chronic kidney disease ([place]) ?  Type II diabetes mellitus with renal manifestations ([place]) ?  HLD (hyperlipidemia) ?  Depression ?  Memory loss ?  Bradycardia ?  Elevated troponin ?  ? ?Dionisio David, MD, FACC ?12/05/2021 ?9:59 AM ? ? ? ?  ?

## 2021-12-06 ENCOUNTER — Encounter: Payer: Self-pay | Admitting: Cardiology

## 2021-12-06 ENCOUNTER — Encounter
Admission: EM | Disposition: A | Discharge status: Home / Self Care | Payer: Self-pay | Source: Ambulatory Visit | Attending: Internal Medicine

## 2021-12-06 HISTORY — PX: PACEMAKER LEADLESS INSERTION: EP1219

## 2021-12-06 LAB — CBC
HCT: 39.6 % (ref 36.0–46.0)
Hemoglobin: 12.7 g/dL (ref 12.0–15.0)
MCH: 31.1 pg (ref 26.0–34.0)
MCHC: 32.1 g/dL (ref 30.0–36.0)
MCV: 96.8 fL (ref 80.0–100.0)
Platelets: 226 10*3/uL (ref 150–400)
RBC: 4.09 MIL/uL (ref 3.87–5.11)
RDW: 13.1 % (ref 11.5–15.5)
WBC: 8.6 10*3/uL (ref 4.0–10.5)
nRBC: 0 % (ref 0.0–0.2)

## 2021-12-06 LAB — GLUCOSE, CAPILLARY
Glucose-Capillary: 108 mg/dL — ABNORMAL HIGH (ref 70–99)
Glucose-Capillary: 84 mg/dL (ref 70–99)
Glucose-Capillary: 201 mg/dL — ABNORMAL HIGH (ref 70–99)
Glucose-Capillary: 155 mg/dL — ABNORMAL HIGH (ref 70–99)
Glucose-Capillary: 215 mg/dL — ABNORMAL HIGH (ref 70–99)

## 2021-12-06 LAB — SURGICAL PCR SCREEN
MRSA, PCR: NEGATIVE
Staphylococcus aureus: NEGATIVE

## 2021-12-06 SURGERY — PACEMAKER LEADLESS INSERTION
Anesthesia: Moderate Sedation

## 2021-12-06 MED ORDER — HEPARIN (PORCINE) IN NACL 2000-0.9 UNIT/L-% IV SOLN
INTRAVENOUS | Status: DC | PRN
Start: 2021-12-06 — End: 2021-12-06
  Administered 2021-12-06: 1000 mL

## 2021-12-06 MED ORDER — IOHEXOL 300 MG/ML  SOLN
INTRAMUSCULAR | Status: DC | PRN
  Administered 2021-12-06: 15 mL

## 2021-12-06 MED ORDER — LIDOCAINE HCL (PF) 1 % IJ SOLN
INTRAMUSCULAR | Status: DC | PRN
  Administered 2021-12-06: 40 mL

## 2021-12-06 MED ORDER — MORPHINE SULFATE (PF) 2 MG/ML IV SOLN: 2.0000 mg | INTRAVENOUS | Status: DC | PRN

## 2021-12-06 MED ORDER — SODIUM CHLORIDE 0.9% FLUSH
3.0000 mL | Freq: Two times a day (BID) | INTRAVENOUS | Status: DC
  Administered 2021-12-06 – 2021-12-07 (×3): 3 mL via INTRAVENOUS

## 2021-12-06 MED ORDER — CEFAZOLIN SODIUM-DEXTROSE 2-4 GM/100ML-% IV SOLN
INTRAVENOUS | Status: AC
  Filled 2021-12-06: qty 100

## 2021-12-06 MED ORDER — ONDANSETRON HCL 4 MG/2ML IJ SOLN: 4.0000 mg | Freq: Four times a day (QID) | INTRAMUSCULAR | Status: DC | PRN

## 2021-12-06 MED ORDER — MIDAZOLAM HCL 2 MG/2ML IJ SOLN
INTRAMUSCULAR | Status: AC
  Filled 2021-12-06: qty 2

## 2021-12-06 MED ORDER — FENTANYL CITRATE (PF) 100 MCG/2ML IJ SOLN
INTRAMUSCULAR | Status: DC | PRN
  Administered 2021-12-06: 12.5 ug via INTRAVENOUS
  Administered 2021-12-06: 25 ug via INTRAVENOUS
  Administered 2021-12-06: 37.5 ug via INTRAVENOUS
  Administered 2021-12-06: 25 ug via INTRAVENOUS

## 2021-12-06 MED ORDER — FENTANYL CITRATE (PF) 100 MCG/2ML IJ SOLN
INTRAMUSCULAR | Status: AC
  Filled 2021-12-06: qty 2

## 2021-12-06 MED ORDER — ACETAMINOPHEN 325 MG PO TABS: 650.0000 mg | ORAL_TABLET | ORAL | Status: DC | PRN

## 2021-12-06 MED ORDER — LIDOCAINE HCL 1 % IJ SOLN
INTRAMUSCULAR | Status: AC
  Filled 2021-12-06: qty 20

## 2021-12-06 MED ORDER — SODIUM CHLORIDE 0.9 % IV SOLN: 250.0000 mL | INTRAVENOUS | Status: DC | PRN

## 2021-12-06 MED ORDER — SODIUM CHLORIDE 0.9% FLUSH: 3.0000 mL | INTRAVENOUS | Status: DC | PRN

## 2021-12-06 MED ORDER — MIDAZOLAM HCL 2 MG/2ML IJ SOLN
INTRAMUSCULAR | Status: DC | PRN
  Administered 2021-12-06: 1 mg via INTRAVENOUS
  Administered 2021-12-06 (×2): .5 mg via INTRAVENOUS

## 2021-12-06 MED ORDER — HEPARIN (PORCINE) IN NACL 1000-0.9 UT/500ML-% IV SOLN
INTRAVENOUS | Status: AC
  Filled 2021-12-06: qty 1000

## 2021-12-06 MED ORDER — HEPARIN SODIUM (PORCINE) 1000 UNIT/ML IJ SOLN
INTRAMUSCULAR | Status: DC | PRN
  Administered 2021-12-06: 4500 [IU] via INTRAVENOUS

## 2021-12-06 MED ORDER — HEPARIN SODIUM (PORCINE) 1000 UNIT/ML IJ SOLN
INTRAMUSCULAR | Status: AC
Start: 2021-12-06 — End: ?
  Filled 2021-12-06: qty 10

## 2021-12-06 SURGICAL SUPPLY — 29 items
CABLE ADAPT PACING TEMP 12FT (ADAPTER) ×1 IMPLANT
CATH INFINITI JR4 5F (CATHETERS) ×1 IMPLANT
DILATOR VESSEL 38 20CM 12FR (INTRODUCER) ×1 IMPLANT
DILATOR VESSEL 38 20CM 14FR (INTRODUCER) ×1 IMPLANT
DILATOR VESSEL 38 20CM 18FR (INTRODUCER) ×1 IMPLANT
DILATOR VESSEL 38 20CM 8FR (INTRODUCER) ×1 IMPLANT
DRAPE BRACHIAL (DRAPES) IMPLANT
KIT ENCORE 26 ADVANTAGE (KITS) IMPLANT
MICRA AV TRANSCATH PACING SYS (Pacemaker) ×2 IMPLANT
MICRA INTRODUCER SHEATH (SHEATH) ×2
NDL PERC 18GX7CM (NEEDLE) IMPLANT
NEEDLE PERC 18GX7CM (NEEDLE) ×2 IMPLANT
PACK CARDIAC CATH (CUSTOM PROCEDURE TRAY) ×2 IMPLANT
PAD ELECT DEFIB RADIOL ZOLL (MISCELLANEOUS) ×1 IMPLANT
PROTECTION STATION PRESSURIZED (MISCELLANEOUS) ×2
SET ATX SIMPLICITY (MISCELLANEOUS) IMPLANT
SHEATH AVANTI 6FR X 11CM (SHEATH) IMPLANT
SHEATH AVANTI 7FRX11 (SHEATH) ×1 IMPLANT
SHEATH INTRODUCER MICRA (SHEATH) IMPLANT
SLEEVE REPOSITIONING LENGTH 30 (MISCELLANEOUS) IMPLANT
STATION PROTECTION PRESSURIZED (MISCELLANEOUS) IMPLANT
SUT SILK 0 FSL (SUTURE) ×1 IMPLANT
SYSTEM PACING TRNSCTH AV MICRA (Pacemaker) IMPLANT
TUBING CIL FLEX 10 FLL-RA (TUBING) IMPLANT
VALVE COPILOT STAT (MISCELLANEOUS) IMPLANT
WIRE AMPLATZ SS-J .035X180CM (WIRE) ×2 IMPLANT
WIRE GUIDERIGHT .035X150 (WIRE) ×1 IMPLANT
WIRE HITORQ VERSACORE ST 145CM (WIRE) ×1 IMPLANT
WIRE PACING TEMP ST TIP 5 (CATHETERS) ×1 IMPLANT

## 2021-12-06 NOTE — Progress Notes (Addendum)
? ? ? ?Progress Note  ? ? ?[name]  XIP:[id number] DOB: 07-Mar-1946  DOA: 12/03/2021 ?PCP: Venia Carbon, MD  ? ? ? ? ?Brief Narrative:  ? ? ?Medical records reviewed and are as summarized below: ? ?[name] is a 76 y.o. female with medical history significant of hypertension, hyperlipidemia, diet-controlled diabetes, memory loss, IBS, chronic diastolic CHF, QBH-4L, chronic back pain, who presented to the hospital with bradycardia. ? ?She was found to have high degree AV block.  Plavix was held and permanent pacemaker was inserted on 12/06/2021. ? ? ? ?Assessment/Plan:  ? ?Principal Problem: ?  Complete heart block ([place]) ?Active Problems: ?  Bradycardia ?  Pain syndrome, chronic ?  Stroke [facility]) ?  Chronic diastolic CHF (congestive heart failure) ([place]) ?  Stage 3b chronic kidney disease ([place]) ?  Type II diabetes mellitus with renal manifestations ([place]) ?  HLD (hyperlipidemia) ?  Depression ?  Memory loss ?  Elevated troponin ? ? ? ?Body mass index is 35.7 kg/m?.  (Obesity) ? ?Type II second-degree AV block: Aricept was held on admission.  Permanent pacemaker placed on 12/06/2021.  Follow-up with cardiologist for further recommendations. ? ?Chronic diastolic CHF: Compensated.  She uses Lasix as needed at home. ? ?History of stroke: Plavix has been held since admission. ? ?Mild elevated troponin most likely from demand ischemia. ? ?Other comorbidities include CKD stage IIIb, hypertension, type II DM, hyperlipidemia, depression, memory impairment, chronic back pain ? ?Transfer from stepdown unit to progressive care unit ? ?Diet Order   ? ?       ?  Diet Heart Room service appropriate? Yes; Fluid consistency: Thin  Diet effective now       ?  ? ?  ?  ? ?  ? ? ?Consultants: ?Cardiologist ? ?Procedures: ?Permanent pacemaker placement on 12/06/2021 ? ? ? ?Medications:  ? ? amLODipine  5 mg Oral Daily  ? ARIPiprazole  2 mg Oral QHS  ? vitamin C  500 mg Oral Daily  ? aspirin EC  81 mg Oral Daily  ? atorvastatin   80 mg Oral QHS  ? Chlorhexidine Gluconate Cloth  6 each Topical Q0600  ? cholecalciferol  1,000 Units Oral Daily  ? DULoxetine  60 mg Oral Daily  ? gabapentin  600 mg Oral BID  ? And  ? gabapentin  900 mg Oral QHS  ? heparin injection (subcutaneous)  5,000 Units Subcutaneous Q8H  ? hydrALAZINE  25 mg Oral BID  ? lisinopril  20 mg Oral Daily  ? And  ? hydrochlorothiazide  25 mg Oral Daily  ? insulin aspart  0-5 Units Subcutaneous QHS  ? insulin aspart  0-9 Units Subcutaneous TID WC  ? meloxicam  15 mg Oral Daily  ? multivitamin with minerals  1 tablet Oral Daily  ? omega-3 acid ethyl esters  1 g Oral Daily  ? sodium chloride flush  3 mL Intravenous Q12H  ? ?Continuous Infusions: ? sodium chloride    ? ceFAZolin    ? ? ? ?Anti-infectives (From admission, onward)  ? ? Start     Dose/Rate Route Frequency Ordered Stop  ? 12/06/21 0759  ceFAZolin (ANCEF) 2-4 GM/100ML-% IVPB       ?Note to Pharmacy: [facility]: cabinet override  ?    12/06/21 0759 12/06/21 2014  ? 12/05/21 2000  ceFAZolin (ANCEF) IVPB 2g/100 mL premix  Status:  Discontinued       ? 2 g ?200 mL/hr over 30  Minutes Intravenous On call 12/05/21 1904 12/06/21 1108  ? ?  ? ? ? ? ? ? ? ? ? ?Family Communication/Anticipated D/C date and plan/Code Status  ? ?DVT prophylaxis: heparin injection 5,000 Units Start: 12/03/21 2200 ? ? ?  Code Status: Full Code ? ?Family Communication: None ?Disposition Plan: Plan to discharge home tomorrow ? ?Status is: Inpatient ?Remains inpatient appropriate because: S/p permanent pacemaker placement ? ? ? ? ? ?Subjective:  ? ?Interval events noted.  She complains of low back pain which is chronic.  No shortness of breath or chest pain. ? ?Objective:  ? ? ?Vitals:  ? 12/06/21 1015 12/06/21 1030 12/06/21 1045 12/06/21 1115  ?BP: (!) 181/78 (!) 150/84 (!) 161/78 (!) 154/89  ?Pulse: 79 69 60 74  ?Resp: 14 15 (!) 22 18  ?Temp:      ?TempSrc:      ?SpO2: 92% 91% 94% 93%  ?Weight:      ?Height:      ? ?No data found. ? ? ?Intake/Output  Summary (Last 24 hours) at 12/06/2021 1150 ?Last data filed at 12/06/2021 1100 ?Gross per 24 hour  ?Intake 0 ml  ?Output 1650 ml  ?Net -1650 ml  ? ?Filed Weights  ? 12/04/21 0500 12/05/21 0500 12/06/21 0448  ?Weight: 82.3 kg 84.6 kg 85.7 kg  ? ? ?Exam: ? ?GEN: NAD ?SKIN: Gross dressing on the right side of her neck and right groin clean, dry and intact ?EYES: No pallor or icterus ?ENT: MMM ?CV: RRR ?PULM: CTA B ?ABD: soft, obese, NT, +BS ?CNS: AAO x 3, non focal ?EXT: No edema or tenderness ? ? ? ? ?  ? ? ?Data Reviewed:  ? ?I have personally reviewed following labs and imaging studies: ? ?Labs: ?Labs show the following:  ? ?Basic Metabolic Panel: ?Recent Labs  ?Lab 12/03/21 ?1444 12/03/21 ?2249  ?NA 138 137  ?K 4.3 3.7  ?CL 103 106  ?CO2 25 26  ?GLUCOSE 143* 213*  ?BUN 30* 28*  ?CREATININE 1.20* 1.14*  ?CALCIUM 9.0 8.4*  ?MG 2.2 2.2  ? ?GFR ?Estimated Creatinine Clearance: 43.7 mL/min (A) (by C-G formula based on SCr of 1.14 mg/dL (H)). ?Liver Function Tests: ?No results for input(s): AST, ALT, ALKPHOS, BILITOT, PROT, ALBUMIN in the last 168 hours. ?No results for input(s): LIPASE, AMYLASE in the last 168 hours. ?No results for input(s): AMMONIA in the last 168 hours. ?Coagulation profile ?Recent Labs  ?Lab 12/03/21 ?1444  ?INR 1.0  ? ? ?CBC: ?Recent Labs  ?Lab 12/03/21 ?1444 12/03/21 ?2249 12/06/21 ?6060  ?WBC 10.5 8.3 8.6  ?NEUTROABS 8.1*  --   --   ?HGB 14.4 12.5 12.7  ?HCT 44.5 38.1 39.6  ?MCV 98.0 96.0 96.8  ?PLT 262 233 226  ? ?Cardiac Enzymes: ?No results for input(s): CKTOTAL, CKMB, CKMBINDEX, TROPONINI in the last 168 hours. ?BNP (last 3 results) ?No results for input(s): PROBNP in the last 8760 hours. ?CBG: ?Recent Labs  ?Lab 12/05/21 ?1141 12/05/21 ?1632 12/05/21 ?1941 12/06/21 ?0750 12/06/21 ?1122  ?GLUCAP 136* 113* 180* 108* 84  ? ?D-Dimer: ?No results for input(s): DDIMER in the last 72 hours. ?Hgb A1c: ?Recent Labs  ?  12/03/21 ?1729  ?HGBA1C 6.3*  ? ?Lipid Profile: ?Recent Labs  ?  12/04/21 ?0459   ?CHOL 110  ?HDL 61  ?[place] 39  ?TRIG 49  ?CHOLHDL 1.8  ? ?Thyroid function studies: ?Recent Labs  ?  12/03/21 ?1444  ?TSH 1.903  ? ?Anemia work up: ?No results for input(s):  VITAMINB12, FOLATE, FERRITIN, TIBC, IRON, RETICCTPCT in the last 72 hours. ?Sepsis Labs: ?Recent Labs  ?Lab 12/03/21 ?1444 12/03/21 ?2249 12/06/21 ?0981  ?WBC 10.5 8.3 8.6  ? ? ?Microbiology ?Recent Results (from the past 240 hour(s))  ?MRSA Next Gen by PCR, Nasal     Status: None  ? Collection Time: 12/03/21  4:58 PM  ? Specimen: Nasal Mucosa; Nasal Swab  ?Result Value Ref Range Status  ? MRSA by PCR Next Gen NOT DETECTED NOT DETECTED Final  ?  Comment: (NOTE) ?The GeneXpert MRSA Assay (FDA approved for NASAL specimens only), ?is one component of a comprehensive MRSA colonization surveillance ?program. It is not intended to diagnose MRSA infection nor to guide ?or monitor treatment for MRSA infections. ?Test performance is not FDA approved in patients less than 2 years ?old. ?Performed at [facility], [place], ?[place] [address] ?  ?Surgical PCR screen     Status: None  ? Collection Time: 12/06/21  1:53 AM  ? Specimen: Nasal Mucosa; Nasal Swab  ?Result Value Ref Range Status  ? MRSA, PCR NEGATIVE NEGATIVE Final  ? Staphylococcus aureus NEGATIVE NEGATIVE Final  ?  Comment: (NOTE) ?The Xpert SA Assay (FDA approved for NASAL specimens in patients 38 ?years of age and older), is one component of a comprehensive ?surveillance program. It is not intended to diagnose infection nor to ?guide or monitor treatment. ?Performed at [facility], [place], ?[place] [address] ?  ? ? ?Procedures and diagnostic studies: ? ?EP PPM/ICD IMPLANT ? ?Result Date: 12/06/2021 ?Successful Micra AV leadless pacemaker implantation   ? ? ? ? ? ? ? ? ? ? ? ? LOS: 3 days  ? ?Samaya Boardley  ?[facility]  ? ?Pager on [email or web address]. If 7PM-7AM, please contact night-coverage at [email or web address] ? ? ? ? ?12/06/2021, 11:50  AM  ? ? ? ? ? ? ? ? ? ?

## 2021-12-07 LAB — BASIC METABOLIC PANEL
Anion gap: 9 (ref 5–15)
BUN: 35 mg/dL — ABNORMAL HIGH (ref 8–23)
CO2: 24 mmol/L (ref 22–32)
Calcium: 8.4 mg/dL — ABNORMAL LOW (ref 8.9–10.3)
Chloride: 107 mmol/L (ref 98–111)
Creatinine, Ser: 1.15 mg/dL — ABNORMAL HIGH (ref 0.44–1.00)
GFR, Estimated: 50 mL/min — ABNORMAL LOW (ref >60)
Glucose, Bld: 128 mg/dL — ABNORMAL HIGH (ref 70–99)
Potassium: 4.2 mmol/L (ref 3.5–5.1)
Sodium: 140 mmol/L (ref 135–145)

## 2021-12-07 LAB — GLUCOSE, CAPILLARY: Glucose-Capillary: 112 mg/dL — ABNORMAL HIGH (ref 70–99)

## 2021-12-07 NOTE — Progress Notes (Signed)
[name] Arreaga to be D/C'd Home per MD order.  Discussed prescriptions and follow up appointments with the patient. No new Prescriptions given to patient, medication list explained in detail. Pt verbalized understanding. Vascular instructions gone over with patient. Patient also instructed to scheduled follow up appt with Dr. Josefa Half and primary md. ? ?Allergies as of 12/07/2021   ?No Known Allergies ?  ? ?  ?Medication List  ?  ? ?STOP taking these medications   ? ?amLODipine 5 MG tablet ?Commonly known as: NORVASC ?  ? ?  ? ?TAKE these medications   ? ?Accu-Chek Softclix Lancets lancets ?Use as instructed ?  ?ARIPiprazole 2 MG tablet ?Commonly known as: ABILIFY ?TAKE 1 TABLET BY MOUTH EVERYDAY AT BEDTIME ?  ?atorvastatin 80 MG tablet ?Commonly known as: LIPITOR ?Take 1 tablet (80 mg total) by mouth daily. ?  ?Chromium Picolinate 1000 MCG Tabs ?Take 1,000 mcg by mouth daily. ?  ?clopidogrel 75 MG tablet ?Commonly known as: PLAVIX ?Take 75 mg by mouth daily. ?  ?Co Q 10 100 MG Caps ?Take 100 mg by mouth daily. ?  ?donepezil 10 MG tablet ?Commonly known as: ARICEPT ?Take 10 mg by mouth every morning. ?  ?DULoxetine 60 MG capsule ?Commonly known as: CYMBALTA ?TAKE 1 CAPSULE BY MOUTH EVERY DAY ?  ?Fish Oil 1000 MG Caps ?Take 2,000 mg by mouth. ?  ?furosemide 20 MG tablet ?Commonly known as: LASIX ?Take 20 mg by mouth daily as needed. ?  ?gabapentin 300 MG capsule ?Commonly known as: NEURONTIN ?TAKE 2 CAPSULES BY MOUTH TWICE DAILY DURING THE DAY AND 3 AT BEDTIME ?  ?hydrALAZINE 25 MG tablet ?Commonly known as: APRESOLINE ?Take 25 mg by mouth 2 (two) times daily. ?  ?HYDROcodone-acetaminophen 7.5-325 MG tablet ?Commonly known as: NORCO ?Take 1 tablet by mouth every 6 (six) hours as needed for moderate pain. ?  ?lisinopril-hydrochlorothiazide 20-25 MG tablet ?Commonly known as: ZESTORETIC ?TAKE 2 TABLETS BY MOUTH EVERY DAY ?  ?meloxicam 15 MG tablet ?Commonly known as: MOBIC ?TAKE 1 TABLET BY MOUTH EVERY DAY ?  ?metFORMIN  500 MG 24 hr tablet ?Commonly known as: GLUCOPHAGE-XR ?Take 500 mg by mouth every morning. ?  ?multivitamin with minerals tablet ?Take 1 tablet by mouth daily. ?  ?OneTouch Verio test strip ?Generic drug: glucose blood ?Use to check blood sugar once a day Dx Code E11.21 ?  ?Soya Lecithin 1200 MG Caps ?Take 1,200 mg by mouth 3 (three) times daily. ?  ?vitamin C 500 MG tablet ?Commonly known as: ASCORBIC ACID ?Take 500 mg by mouth daily. ?  ?Vitamin D3 25 MCG (1000 UT) Caps ?Take 1,000 Units by mouth daily. ?  ? ?  ? ? ?Vitals:  ? 12/07/21 0404 12/07/21 4540  ?BP: (!) 115/57 127/60  ?Pulse: 71 73  ?Resp: 16 16  ?Temp: 97.9 ?F (36.6 ?C) 98.3 ?F (36.8 ?C)  ?SpO2: 94% 94%  ? ? ?Skin clean, dry and intact without evidence of skin break down, no evidence of skin tears noted. IV catheter discontinued intact. Site without signs and symptoms of complications. Dressing and pressure applied. Pt denies pain at this time. No complaints noted. ? ?An After Visit Summary was printed and given to the patient. ?Patient escorted via [place], and D/C home via private auto. ? ?[place]  ?

## 2021-12-07 NOTE — Progress Notes (Signed)
SUBJECTIVE: [name] is a 76 y.o. female with medical history significant of hypertension, hyperlipidemia, diet-controlled diabetes, memory loss, IBS, chronic diastolic CHF, TDV-7O, chronic back pain, who presented to the hospital with bradycardia. ?  ?She was found to have high degree AV block.  Plavix was held and permanent pacemaker was inserted on 12/06/2021. ? ? ?Vitals:  ? 12/06/21 2221 12/07/21 0404 12/07/21 0408 12/07/21 1607  ?BP: 113/63 (!) 115/57  127/60  ?Pulse: 68 71  73  ?Resp: '18 16  16  '$ ?Temp: 98.4 ?F (36.9 ?C) 97.9 ?F (36.6 ?C)  98.3 ?F (36.8 ?C)  ?TempSrc: Oral Oral    ?SpO2: 97% 94%  94%  ?Weight: 84.1 kg  83.6 kg   ?Height: '5\' 1"'$  (1.549 m)     ? ? ?Intake/Output Summary (Last 24 hours) at 12/07/2021 1114 ?Last data filed at 12/07/2021 0501 ?Gross per 24 hour  ?Intake 840 ml  ?Output 1350 ml  ?Net -510 ml  ? ? ?LABS: ?Basic Metabolic Panel: ?Recent Labs  ?  12/07/21 ?0409  ?NA 140  ?K 4.2  ?CL 107  ?CO2 24  ?GLUCOSE 128*  ?BUN 35*  ?CREATININE 1.15*  ?CALCIUM 8.4*  ? ?Liver Function Tests: ?No results for input(s): AST, ALT, ALKPHOS, BILITOT, PROT, ALBUMIN in the last 72 hours. ?No results for input(s): LIPASE, AMYLASE in the last 72 hours. ?CBC: ?Recent Labs  ?  12/06/21 ?3710  ?WBC 8.6  ?HGB 12.7  ?HCT 39.6  ?MCV 96.8  ?PLT 226  ? ?Cardiac Enzymes: ?No results for input(s): CKTOTAL, CKMB, CKMBINDEX, TROPONINI in the last 72 hours. ?BNP: ?Invalid input(s): POCBNP ?D-Dimer: ?No results for input(s): DDIMER in the last 72 hours. ?Hemoglobin A1C: ?No results for input(s): HGBA1C in the last 72 hours. ?Fasting Lipid Panel: ?No results for input(s): CHOL, HDL, LDLCALC, TRIG, CHOLHDL, LDLDIRECT in the last 72 hours. ?Thyroid Function Tests: ?No results for input(s): TSH, T4TOTAL, T3FREE, THYROIDAB in the last 72 hours. ? ?Invalid input(s): FREET3 ?Anemia Panel: ?No results for input(s): VITAMINB12, FOLATE, FERRITIN, TIBC, IRON, RETICCTPCT in the last 72 hours. ? ? ?PHYSICAL EXAM ?General: Well  developed, well nourished, in no acute distress ?HEENT:  Normocephalic and atramatic ?Neck:  No JVD.  ?Lungs: Clear bilaterally to auscultation and percussion. ?Heart: HRRR . Normal S1 and S2 without gallops or murmurs.  ?Abdomen: Bowel sounds are positive, abdomen soft and non-tender  ?Msk:  Back normal, normal gait. Normal strength and tone for age. ?Extremities: No clubbing, cyanosis or edema.   ?Neuro: Alert and oriented X 3. ?Psych:  Good affect, responds appropriately ? ?TELEMETRY: paced rhythm, HR 68 bpm ? ?ASSESSMENT AND PLAN: Patient doing well. Denies chest pain, shortness of breath, dizziness. Micra implanted 12/06/21 for AV block. Patient can be discharged home today with follow up in office on Thursday, 12/12/21 at 10:00 am.  ? ?Principal Problem: ?  Complete heart block ([place]) ?Active Problems: ?  Pain syndrome, chronic ?  Stroke [facility]) ?  Chronic diastolic CHF (congestive heart failure) ([place]) ?  Stage 3b chronic kidney disease ([place]) ?  Type II diabetes mellitus with renal manifestations ([place]) ?  HLD (hyperlipidemia) ?  Depression ?  Memory loss ?  Bradycardia ?  Elevated troponin ?  ? ?[employer], FNP-C ?12/07/2021 ?11:14 AM ? ? ? ?    ?

## 2021-12-07 NOTE — Discharge Summary (Signed)
?Physician Discharge Summary ?  ?Patient: [name] MRN: [id number] DOB: 03/01/1946  ?Admit date:     12/03/2021  ?Discharge date: 12/07/21  ?Discharge Physician: Jennye Boroughs  ? ?PCP: Venia Carbon, MD  ? ?Recommendations at discharge:  ? ?Follow-up with cardiologist on 12/12/2021 at 10 AM as scheduled. ?Follow-up with PCP in 1 to 2 weeks ? ?Discharge Diagnoses: ?Principal Problem: ?  Complete heart block ([place]) ?Active Problems: ?  Bradycardia ?  Pain syndrome, chronic ?  Stroke [facility]) ?  Chronic diastolic CHF (congestive heart failure) ([place]) ?  Stage 3b chronic kidney disease ([place]) ?  Type II diabetes mellitus with renal manifestations ([place]) ?  HLD (hyperlipidemia) ?  Depression ?  Memory loss ?  Elevated troponin ? ?Resolved Problems: ?  * No resolved hospital problems. * ? ?Hospital Course: ? ?[name] is a 76 y.o. female with medical history significant of hypertension, hyperlipidemia, diet-controlled diabetes, memory loss, IBS, chronic diastolic CHF, GXQ-1J, chronic back pain, who presented to the hospital with bradycardia. ?  ?She was found to have high degree AV block.  Plavix was held and permanent pacemaker was inserted on 12/06/2021.  Her condition has improved and she is deemed stable for discharge home today. ?  ?  ? ? ? ? ?  ? ? ?Consultants: Cardiologist ?Procedures performed: Insertion of permanent pacemaker ?Disposition: Home ?Diet recommendation:  ?Discharge Diet Orders (From admission, onward)  ? ?  Start     Ordered  ? 12/07/21 0000  Diet - low sodium heart healthy       ? 12/07/21 1123  ? 12/07/21 0000  Diet Carb Modified       ? 12/07/21 1123  ? ?  ?  ? ?  ? ?Cardiac and Carb modified diet ?DISCHARGE MEDICATION: ?Allergies as of 12/07/2021   ?No Known Allergies ?  ? ?  ?Medication List  ?  ? ?STOP taking these medications   ? ?amLODipine 5 MG tablet ?Commonly known as: NORVASC ?  ? ?  ? ?TAKE these medications   ? ?Accu-Chek Softclix Lancets lancets ?Use as instructed ?  ?ARIPiprazole  2 MG tablet ?Commonly known as: ABILIFY ?TAKE 1 TABLET BY MOUTH EVERYDAY AT BEDTIME ?  ?atorvastatin 80 MG tablet ?Commonly known as: LIPITOR ?Take 1 tablet (80 mg total) by mouth daily. ?  ?Chromium Picolinate 1000 MCG Tabs ?Take 1,000 mcg by mouth daily. ?  ?clopidogrel 75 MG tablet ?Commonly known as: PLAVIX ?Take 75 mg by mouth daily. ?  ?Co Q 10 100 MG Caps ?Take 100 mg by mouth daily. ?  ?donepezil 10 MG tablet ?Commonly known as: ARICEPT ?Take 10 mg by mouth every morning. ?  ?DULoxetine 60 MG capsule ?Commonly known as: CYMBALTA ?TAKE 1 CAPSULE BY MOUTH EVERY DAY ?  ?Fish Oil 1000 MG Caps ?Take 2,000 mg by mouth. ?  ?furosemide 20 MG tablet ?Commonly known as: LASIX ?Take 20 mg by mouth daily as needed. ?  ?gabapentin 300 MG capsule ?Commonly known as: NEURONTIN ?TAKE 2 CAPSULES BY MOUTH TWICE DAILY DURING THE DAY AND 3 AT BEDTIME ?  ?hydrALAZINE 25 MG tablet ?Commonly known as: APRESOLINE ?Take 25 mg by mouth 2 (two) times daily. ?  ?HYDROcodone-acetaminophen 7.5-325 MG tablet ?Commonly known as: NORCO ?Take 1 tablet by mouth every 6 (six) hours as needed for moderate pain. ?  ?lisinopril-hydrochlorothiazide 20-25 MG tablet ?Commonly known as: ZESTORETIC ?TAKE 2 TABLETS BY MOUTH EVERY DAY ?  ?meloxicam 15 MG tablet ?Commonly known  as: MOBIC ?TAKE 1 TABLET BY MOUTH EVERY DAY ?  ?metFORMIN 500 MG 24 hr tablet ?Commonly known as: GLUCOPHAGE-XR ?Take 500 mg by mouth every morning. ?  ?multivitamin with minerals tablet ?Take 1 tablet by mouth daily. ?  ?OneTouch Verio test strip ?Generic drug: glucose blood ?Use to check blood sugar once a day Dx Code E11.21 ?  ?Soya Lecithin 1200 MG Caps ?Take 1,200 mg by mouth 3 (three) times daily. ?  ?vitamin C 500 MG tablet ?Commonly known as: ASCORBIC ACID ?Take 500 mg by mouth daily. ?  ?Vitamin D3 25 MCG (1000 UT) Caps ?Take 1,000 Units by mouth daily. ?  ? ?  ? ? ?Discharge Exam: ?Filed Weights  ? 12/06/21 0448 12/06/21 2221 12/07/21 0408  ?Weight: 85.7 kg 84.1 kg 83.6  kg  ? ?GEN: NAD ?SKIN: Warm and dry.  No bleeding from right groin (site catheterization) ?EYES: No pallor or icterus ?ENT: MMM ?CV: RRR ?PULM: CTA B ?ABD: soft, obese, NT, +BS ?CNS: AAO x 3, non focal ?EXT: No edema or tenderness ? ? ?Condition at discharge: good ? ?The results of significant diagnostics from this hospitalization (including imaging, microbiology, ancillary and laboratory) are listed below for reference.  ? ?Imaging Studies: ?EP PPM/ICD IMPLANT ? ?Result Date: 12/06/2021 ?Successful Micra AV leadless pacemaker implantation  ? ?DG Chest Portable 1 View ? ?Result Date: 12/03/2021 ?CLINICAL DATA:  heart block EXAM: PORTABLE CHEST 1 VIEW.  Patient is slightly rotated. COMPARISON:  None. FINDINGS: Cardiac paddles overlie the chest. The cardiac silhouette is enlarged in caliber. The heart and mediastinal contours are within normal limits. Linear atelectasis versus scarring of the lingula. No focal consolidation. No pulmonary edema. No pleural effusion. No pneumothorax. No acute osseous abnormality. IMPRESSION: Enlarged cardiac silhouette with no active cardiopulmonary disease. Electronically Signed   By: Iven Finn M.D.   On: 12/03/2021 15:09   ? ?Microbiology: ?Results for orders placed or performed during the hospital encounter of 12/03/21  ?MRSA Next Gen by PCR, Nasal     Status: None  ? Collection Time: 12/03/21  4:58 PM  ? Specimen: Nasal Mucosa; Nasal Swab  ?Result Value Ref Range Status  ? MRSA by PCR Next Gen NOT DETECTED NOT DETECTED Final  ?  Comment: (NOTE) ?The GeneXpert MRSA Assay (FDA approved for NASAL specimens only), ?is one component of a comprehensive MRSA colonization surveillance ?program. It is not intended to diagnose MRSA infection nor to guide ?or monitor treatment for MRSA infections. ?Test performance is not FDA approved in patients less than 2 years ?old. ?Performed at [facility], [place], ?[place] [address] ?  ?Surgical PCR screen     Status:  None  ? Collection Time: 12/06/21  1:53 AM  ? Specimen: Nasal Mucosa; Nasal Swab  ?Result Value Ref Range Status  ? MRSA, PCR NEGATIVE NEGATIVE Final  ? Staphylococcus aureus NEGATIVE NEGATIVE Final  ?  Comment: (NOTE) ?The Xpert SA Assay (FDA approved for NASAL specimens in patients 5 ?years of age and older), is one component of a comprehensive ?surveillance program. It is not intended to diagnose infection nor to ?guide or monitor treatment. ?Performed at [facility], [place], ?[place] [address] ?  ? ?*Note: Due to a large number of results and/or encounters for the requested time period, some results have not been displayed. A complete set of results can be found in Results Review.  ? ? ?Labs: ?CBC: ?Recent Labs  ?Lab 12/03/21 ?1444 12/03/21 ?2249  12/06/21 ?6438  ?WBC 10.5 8.3 8.6  ?NEUTROABS 8.1*  --   --   ?HGB 14.4 12.5 12.7  ?HCT 44.5 38.1 39.6  ?MCV 98.0 96.0 96.8  ?PLT 262 233 226  ? ?Basic Metabolic Panel: ?Recent Labs  ?Lab 12/03/21 ?1444 12/03/21 ?2249 12/07/21 ?0409  ?NA 138 137 140  ?K 4.3 3.7 4.2  ?CL 103 106 107  ?CO2 '25 26 24  '$ ?GLUCOSE 143* 213* 128*  ?BUN 30* 28* 35*  ?CREATININE 1.20* 1.14* 1.15*  ?CALCIUM 9.0 8.4* 8.4*  ?MG 2.2 2.2  --   ? ?Liver Function Tests: ?No results for input(s): AST, ALT, ALKPHOS, BILITOT, PROT, ALBUMIN in the last 168 hours. ?CBG: ?Recent Labs  ?Lab 12/06/21 ?1122 12/06/21 ?1539 12/06/21 ?1801 12/06/21 ?2134 12/07/21 ?0833  ?GLUCAP 84 201* 155* 215* 112*  ? ? ?Discharge time spent: greater than 30 minutes. ? ?Signed: ?Jennye Boroughs, MD ?[facility] ?12/07/2021 ?

## 2021-12-07 NOTE — Discharge Instructions (Signed)
Patient may shower 12/09/2021.  May remove dressing right groin on 12/09/2021 and replace with bandage. ?

## 2021-12-07 NOTE — TOC Transition Note (Signed)
Transition of Care (TOC) - CM/SW Discharge Note ? ? ?Patient Details  ?Name: [name] ?MRN: [id number] ?Date of Birth: Jun 17, 1946 ? ?Transition of Care (TOC) CM/SW Contact:  ?Izola Price, RN ?Phone Number: ?12/07/2021, 11:37 AM ? ? ?Clinical Narrative:  Patient has discharge to home/self care. Transportation plan identified earlier as patient uses [name] routinely and plans to use on discharge to home. No wound care ordered post discharge and to follow up with PC in 1-2 weeks (Dr. Viviana Simpler). Uses RX:  ?[id number] [place], [place] [address] [facility]. BSimmie DaviesRN CM  ? ? ?Final next level of care: Home/Self Care ?Barriers to Discharge: Barriers Resolved ? ? ?Patient Goals and CMS Choice ?Patient states their goals for this hospitalization and ongoing recovery are:: to get back home - getting a pacemaker tomorrow ?  ?Choice offered to / list presented to : NA ? ?Discharge Placement ?  ?           ?  ?  ?  ?  ? ?Discharge Plan and Services ?  ?Discharge Planning Services: CM Consult ?           ?DME Arranged: N/A ?DME Agency: NA ?  ?  ?  ?HH Arranged: NA ?[facility]: NA ?  ?  ?  ? ?Social Determinants of Health (SDOH) Interventions ?  ? ? ?Readmission Risk Interventions ? ?  12/05/2021  ?  3:32 PM  ?Readmission Risk Prevention Plan  ?Transportation Screening Complete  ?PCP or Specialist Appt within 5-7 Days Complete  ?Home Care Screening Complete  ?Medication Review (RN CM) Complete  ? ? ? ? ? ?

## 2021-12-10 DIAGNOSIS — E1140 Type 2 diabetes mellitus with diabetic neuropathy, unspecified: Secondary | ICD-10-CM | POA: Diagnosis not present

## 2021-12-10 DIAGNOSIS — I452 Bifascicular block: Secondary | ICD-10-CM | POA: Diagnosis not present

## 2021-12-10 DIAGNOSIS — I10 Essential (primary) hypertension: Secondary | ICD-10-CM | POA: Diagnosis not present

## 2021-12-10 DIAGNOSIS — I872 Venous insufficiency (chronic) (peripheral): Secondary | ICD-10-CM | POA: Diagnosis not present

## 2021-12-10 DIAGNOSIS — Z950 Presence of cardiac pacemaker: Secondary | ICD-10-CM | POA: Diagnosis not present

## 2021-12-10 DIAGNOSIS — R9431 Abnormal electrocardiogram [ECG] [EKG]: Secondary | ICD-10-CM | POA: Diagnosis not present

## 2021-12-12 ENCOUNTER — Other Ambulatory Visit: Payer: Self-pay

## 2021-12-12 ENCOUNTER — Telehealth: Payer: Self-pay

## 2021-12-12 MED ORDER — HYDROCODONE-ACETAMINOPHEN 7.5-325 MG PO TABS: 1.0000 | ORAL_TABLET | Freq: Four times a day (QID) | ORAL | 0 refills | Status: DC | PRN

## 2021-12-12 NOTE — Telephone Encounter (Signed)
MEDICATION: Norco  ? ?PHARMACY:[facility]  ? ?Comments: Patient aware not due for refill until 4/13 but wanted to allow time to have refill processed.  ? ?**Let patient know to contact pharmacy at the end of the day to make sure medication is ready. ** ? ?** Please notify patient to allow 48-72 hours to process** ? ?**Encourage patient to contact the pharmacy for refills or they can request refills through [facility]** ?  ?

## 2021-12-12 NOTE — Telephone Encounter (Signed)
Spoke to pt about recent ER visit. She said she is doing pretty good. Has been to cardiologist.They advised her to stop the blood thinners. Asked what Dr Silvio Pate thought. I told her I would think the cardiologist knows what they are doing if they advised her to stop them. Told her we would see her on 01-20-22 as previously scheduled unless she needs to be seen sooner. ?

## 2021-12-16 DIAGNOSIS — M5136 Other intervertebral disc degeneration, lumbar region: Secondary | ICD-10-CM | POA: Diagnosis not present

## 2021-12-16 DIAGNOSIS — M5416 Radiculopathy, lumbar region: Secondary | ICD-10-CM | POA: Diagnosis not present

## 2021-12-17 DIAGNOSIS — M5416 Radiculopathy, lumbar region: Secondary | ICD-10-CM | POA: Diagnosis not present

## 2021-12-17 DIAGNOSIS — E1140 Type 2 diabetes mellitus with diabetic neuropathy, unspecified: Secondary | ICD-10-CM | POA: Diagnosis not present

## 2022-01-07 DIAGNOSIS — M5136 Other intervertebral disc degeneration, lumbar region: Secondary | ICD-10-CM | POA: Diagnosis not present

## 2022-01-07 DIAGNOSIS — M5416 Radiculopathy, lumbar region: Secondary | ICD-10-CM | POA: Diagnosis not present

## 2022-01-16 ENCOUNTER — Encounter: Payer: Self-pay | Admitting: Internal Medicine

## 2022-01-16 NOTE — Telephone Encounter (Signed)
Error  Encourage patient to contact the pharmacy for refills or they can request refills through [facility]  Did the patient contact the pharmacy:  N   LAST APPOINTMENT DATE: 2.15.23  NEXT APPOINTMENT DATE:   MEDICATION:  Is the patient out of medication?   If not, how much is left?  Is this a 90 day supply:   PHARMACY:  Let patient know to contact pharmacy at the end of the day to make sure medication is ready.  Please notify patient to allow 48-72 hours to process

## 2022-01-20 ENCOUNTER — Ambulatory Visit: Payer: Medicare Other | Admitting: Internal Medicine

## 2022-01-22 ENCOUNTER — Other Ambulatory Visit: Payer: Self-pay | Admitting: Internal Medicine

## 2022-01-22 DIAGNOSIS — I452 Bifascicular block: Secondary | ICD-10-CM | POA: Diagnosis not present

## 2022-01-22 MED ORDER — HYDROCODONE-ACETAMINOPHEN 7.5-325 MG PO TABS: 1.0000 | ORAL_TABLET | Freq: Four times a day (QID) | ORAL | 0 refills | Status: DC | PRN

## 2022-01-22 NOTE — Telephone Encounter (Signed)
She needs a follow up appt before the next refill ?

## 2022-01-22 NOTE — Telephone Encounter (Signed)
[facility], wants to know [facility], please advise  ?

## 2022-01-22 NOTE — Telephone Encounter (Signed)
Is this okay to refill  ? ?Lasted  OV 10/23/21 ? ?Lasted Filled  12/12/21  ? ?Next Ov none  ?

## 2022-01-24 ENCOUNTER — Other Ambulatory Visit: Payer: Self-pay | Admitting: Internal Medicine

## 2022-01-24 MED ORDER — HYDROCODONE-ACETAMINOPHEN 7.5-325 MG PO TABS: 1.0000 | ORAL_TABLET | Freq: Four times a day (QID) | ORAL | 0 refills | Status: DC | PRN

## 2022-01-24 NOTE — Telephone Encounter (Signed)
Patient request that script goes to a different pharmacy because her pharmacy is out.

## 2022-01-24 NOTE — Telephone Encounter (Signed)
Encourage patient to contact the pharmacy for refills or they can request refills through [facility]  Did the patient contact the pharmacy:  Yes, but they did not have it so the pt is finding a different pharmacy that has it.  LAST APPOINTMENT DATE:  01/20/2022  NEXT APPOINTMENT DATE:  02/19/2022  MEDICATION:  HYDROcodone-acetaminophen (NORCO) 7.5-325 MG tablet  Is the patient out of medication? Yes  If not, how much is left? N/A  Is this a 90 day supply: No  PHARMACY: [address] [place], [place] [address] Phone number: [phone]  Let patient know to contact pharmacy at the end of the day to make sure medication is ready.  Please notify patient to allow 48-72 hours to process

## 2022-01-29 DIAGNOSIS — R6889 Other general symptoms and signs: Secondary | ICD-10-CM | POA: Diagnosis not present

## 2022-01-29 DIAGNOSIS — M5416 Radiculopathy, lumbar region: Secondary | ICD-10-CM | POA: Diagnosis not present

## 2022-01-29 DIAGNOSIS — M48062 Spinal stenosis, lumbar region with neurogenic claudication: Secondary | ICD-10-CM | POA: Diagnosis not present

## 2022-02-14 DIAGNOSIS — M545 Low back pain, unspecified: Secondary | ICD-10-CM | POA: Diagnosis not present

## 2022-02-14 DIAGNOSIS — M6281 Muscle weakness (generalized): Secondary | ICD-10-CM | POA: Diagnosis not present

## 2022-02-14 DIAGNOSIS — R262 Difficulty in walking, not elsewhere classified: Secondary | ICD-10-CM | POA: Diagnosis not present

## 2022-02-17 DIAGNOSIS — M6281 Muscle weakness (generalized): Secondary | ICD-10-CM | POA: Diagnosis not present

## 2022-02-17 DIAGNOSIS — M545 Low back pain, unspecified: Secondary | ICD-10-CM | POA: Diagnosis not present

## 2022-02-17 DIAGNOSIS — R262 Difficulty in walking, not elsewhere classified: Secondary | ICD-10-CM | POA: Diagnosis not present

## 2022-02-19 ENCOUNTER — Ambulatory Visit (INDEPENDENT_AMBULATORY_CARE_PROVIDER_SITE_OTHER): Payer: Medicare Other | Admitting: Internal Medicine

## 2022-02-19 ENCOUNTER — Encounter: Payer: Self-pay | Admitting: Internal Medicine

## 2022-02-19 DIAGNOSIS — M48062 Spinal stenosis, lumbar region with neurogenic claudication: Secondary | ICD-10-CM

## 2022-02-19 DIAGNOSIS — F1120 Opioid dependence, uncomplicated: Secondary | ICD-10-CM | POA: Diagnosis not present

## 2022-02-19 DIAGNOSIS — L570 Actinic keratosis: Secondary | ICD-10-CM | POA: Diagnosis not present

## 2022-02-19 MED ORDER — HYDROCODONE-ACETAMINOPHEN 7.5-325 MG PO TABS: 1.0000 | ORAL_TABLET | Freq: Four times a day (QID) | ORAL | 0 refills | Status: DC | PRN

## 2022-02-19 NOTE — Assessment & Plan Note (Signed)
Continues with physiatry but epidurals not much help Will go back for follow up Continues on hydrocodone 7.5/325 four times a day

## 2022-02-19 NOTE — Assessment & Plan Note (Signed)
At the end of the visit--she asks me to check scabbed area on vertex of scalp present for 6 months Has ~7-10m crusted lesion Discussed options and she gave verbal consent for Rx  Liquid nitrogen 30 seconds x 2 Tolerated well Discussed home rx

## 2022-02-19 NOTE — Assessment & Plan Note (Signed)
PDMP reviewed No concerns 

## 2022-02-19 NOTE — Progress Notes (Signed)
Subjective:    Patient ID: [name], female    DOB: 20-Mar-1946, 76 y.o.   MRN: [id number]  HPI Here for follow up of chronic pain and narcotic dependence  She did see Dr [name] Had epidural injection but it didn't help too much Does get some relief from the hydrocodone--allowed 4/day but tries to limit (not bad if she is sitting but gets bad when she does any work around the house) She does her own shopping (with friend or [name])  Hasn't been checking sugars--has been fine  Still gets frustrated by chronic pain etc She has found efforts by Dr [name] to help with pain has helped her outlook (even though it hasn't helped much) Is doing physical therapy now  Current Outpatient Medications on File Prior to Visit  Medication Sig Dispense Refill   Accu-Chek Softclix Lancets lancets Use as instructed 100 each 3   ARIPiprazole (ABILIFY) 2 MG tablet TAKE 1 TABLET BY MOUTH EVERYDAY AT BEDTIME 90 tablet 3   atorvastatin (LIPITOR) 80 MG tablet Take 1 tablet (80 mg total) by mouth daily. 30 tablet 3   Cholecalciferol (VITAMIN D3) 25 MCG (1000 UT) CAPS Take 1,000 Units by mouth daily.      clopidogrel (PLAVIX) 75 MG tablet Take 75 mg by mouth daily.     donepezil (ARICEPT) 10 MG tablet Take 10 mg by mouth every morning.     DULoxetine (CYMBALTA) 60 MG capsule TAKE 1 CAPSULE BY MOUTH EVERY DAY 90 capsule 3   furosemide (LASIX) 20 MG tablet Take 20 mg by mouth daily as needed.     gabapentin (NEURONTIN) 300 MG capsule TAKE 2 CAPSULES BY MOUTH TWICE DAILY DURING THE DAY AND 3 AT BEDTIME (Patient taking differently: Take 300 mg by mouth. Takes three at bedtime) 630 capsule 4   glucose blood (ONETOUCH VERIO) test strip Use to check blood sugar once a day Dx Code E11.21 100 strip 12   hydrALAZINE (APRESOLINE) 25 MG tablet Take 25 mg by mouth 2 (two) times daily.     HYDROcodone-acetaminophen (NORCO) 7.5-325 MG tablet Take 1 tablet by mouth every 6 (six) hours as needed for moderate pain. 120  tablet 0   lisinopril-hydrochlorothiazide (ZESTORETIC) 20-25 MG tablet TAKE 2 TABLETS BY MOUTH EVERY DAY 180 tablet 3   meloxicam (MOBIC) 15 MG tablet TAKE 1 TABLET BY MOUTH EVERY DAY 90 tablet 1   Multiple Vitamins-Minerals (MULTIVITAMIN WITH MINERALS) tablet Take 1 tablet by mouth daily.      Soya Lecithin 1200 MG CAPS Take 1,200 mg by mouth 3 (three) times daily.      vitamin C (ASCORBIC ACID) 500 MG tablet Take 500 mg by mouth daily.     Chromium Picolinate 1000 MCG TABS Take 1,000 mcg by mouth daily.  (Patient not taking: Reported on 02/19/2022)     Coenzyme Q10 (CO Q 10) 100 MG CAPS Take 100 mg by mouth daily.  (Patient not taking: Reported on 02/19/2022)     metFORMIN (GLUCOPHAGE-XR) 500 MG 24 hr tablet Take 500 mg by mouth every morning. (Patient not taking: Reported on 02/19/2022)     Omega-3 Fatty Acids (FISH OIL) 1000 MG CAPS Take 2,000 mg by mouth. (Patient not taking: Reported on 02/19/2022)     No current facility-administered medications on file prior to visit.    No Known Allergies  Past Medical History:  Diagnosis Date   Arthritis    Confusion    Diabetes mellitus without complication ([place])    type  2   Hepatitis    pt. denies   Hypertension    Lumbar stenosis    Memory loss    Nocturia    Numbness and tingling    legs and feet   Wears glasses     Past Surgical History:  Procedure Laterality Date   BACK SURGERY     CESAREAN SECTION  1978   COLONOSCOPY     HEMORRHOID SURGERY  2015   JOINT REPLACEMENT     01-08-18 Dr. Kathrynn Speed   PACEMAKER LEADLESS INSERTION N/A 12/06/2021   Procedure: PACEMAKER LEADLESS INSERTION;  Surgeon: Isaias Cowman, MD;  Location: [facility];  Service: Cardiovascular;  Laterality: N/A;   TOTAL HIP ARTHROPLASTY Left 01/08/2018   Procedure: LEFT TOTAL HIP ARTHROPLASTY ANTERIOR APPROACH;  Surgeon: Mcarthur Rossetti, MD;  Location: [facility];  Service: Orthopedics;  Laterality: Left;   TRANSFORAMINAL LUMBAR INTERBODY FUSION  (TLIF) WITH PEDICLE SCREW FIXATION 2 LEVEL N/A 11/07/2015   Procedure: Lumbar three-four Lumbar four-five  transforaminal lumbar interbody fusion with interbody prosthesis posterior lateral arthrodesis and posterior segmental instrumentation;  Surgeon: Newman Pies, MD;  Location: [facility];  Service: Neurosurgery;  Laterality: N/A;    Family History  Problem Relation Age of Onset   Hypertension Other    Cancer Other        ovarian   Cancer Mother    Heart disease Father     Social History   Socioeconomic History   Marital status: Divorced    Spouse name: Not on file   Number of children: 4   Years of education: Not on file   Highest education level: Associate degree: occupational, Hotel manager, or vocational program  Occupational History   Not on file  Tobacco Use   Smoking status: Never    Passive exposure: Never   Smokeless tobacco: Never  Vaping Use   Vaping Use: Never used  Substance and Sexual Activity   Alcohol use: No    Comment: rarely   Drug use: No   Sexual activity: Not Currently  Other Topics Concern   Not on file  Social History Narrative   Has a living will.   Son is health care POA   Would accept resuscitation.     Would accept life support    No tube feeds if cognitively unaware   Social Determinants of Health   Financial Resource Strain: Low Risk  (11/20/2017)   Overall Financial Resource Strain (CARDIA)    Difficulty of Paying Living Expenses: Not very hard  Food Insecurity: No Food Insecurity (11/20/2017)   Hunger Vital Sign    Worried About Running Out of Food in the Last Year: Never true    Ran Out of Food in the Last Year: Never true  Transportation Needs: Unmet Transportation Needs (11/20/2017)   PRAPARE - Hydrologist (Medical): Yes    Lack of Transportation (Non-Medical): Yes  Physical Activity: Inactive (11/20/2017)   Exercise Vital Sign    Days of Exercise per Week: 0 days    Minutes of Exercise per  Session: 0 min  Stress: Stress Concern Present (11/20/2017)   [place]    Feeling of Stress : To some extent  Social Connections: Unknown (11/20/2017)   Social Connection and Isolation Panel [NHANES]    Frequency of Communication with Friends and Family: Not on file    Frequency of Social Gatherings with Friends and Family: Not on file  Attends Religious Services: Never    Active Member of Clubs or Organizations: No    Attends Archivist Meetings: Never    Marital Status: Divorced  Human resources officer Violence: Not At Risk (11/20/2017)   Humiliation, Afraid, Rape, and Kick questionnaire    Fear of Current or Ex-Partner: No    Emotionally Abused: No    Physically Abused: No    Sexually Abused: No   Review of Systems Sleeps okay--some issues with nocturia Appetite so so  Weight is stable     Objective:   Physical Exam Constitutional:      Appearance: Normal appearance.  Neurological:     Mental Status: She is alert.  Psychiatric:        Mood and Affect: Mood normal.        Behavior: Behavior normal.            Assessment & Plan:

## 2022-03-22 ENCOUNTER — Other Ambulatory Visit: Payer: Self-pay | Admitting: Internal Medicine

## 2022-03-25 DIAGNOSIS — E1140 Type 2 diabetes mellitus with diabetic neuropathy, unspecified: Secondary | ICD-10-CM | POA: Diagnosis not present

## 2022-03-25 DIAGNOSIS — I872 Venous insufficiency (chronic) (peripheral): Secondary | ICD-10-CM | POA: Diagnosis not present

## 2022-03-25 DIAGNOSIS — R9431 Abnormal electrocardiogram [ECG] [EKG]: Secondary | ICD-10-CM | POA: Diagnosis not present

## 2022-03-25 DIAGNOSIS — I10 Essential (primary) hypertension: Secondary | ICD-10-CM | POA: Diagnosis not present

## 2022-03-25 DIAGNOSIS — Z950 Presence of cardiac pacemaker: Secondary | ICD-10-CM | POA: Diagnosis not present

## 2022-03-25 DIAGNOSIS — R0789 Other chest pain: Secondary | ICD-10-CM | POA: Diagnosis not present

## 2022-04-02 ENCOUNTER — Other Ambulatory Visit: Payer: Self-pay | Admitting: Internal Medicine

## 2022-04-02 MED ORDER — HYDROCODONE-ACETAMINOPHEN 7.5-325 MG PO TABS: 1.0000 | ORAL_TABLET | Freq: Four times a day (QID) | ORAL | 0 refills | Status: DC | PRN

## 2022-04-02 NOTE — Telephone Encounter (Signed)
Name of Medication: Hydrocodone  Name of [facility] [place] or Written Date and Quantity: 02-19-22 #120 Last Office Visit and Type: 02-19-22 Next Office Visit and Type: 07-01-22 Last Controlled Substance Agreement Date: N/A Last UDS: N/A

## 2022-04-02 NOTE — Telephone Encounter (Signed)
Caller Name: Natascha Edmonds Call back phone #: [phone]  MEDICATION(S): HYDROcodone-acetaminophen ([place]) 7.5-325 MG tablet  Days of Med Remaining: unsure  Has the patient contacted their pharmacy (YES/NO)? No, controlled  What did pharmacy advise?  [facility] [place]  ~~~Please advise patient/caregiver to allow 2-3 business days to process RX refills.

## 2022-04-02 NOTE — Telephone Encounter (Signed)
Spoke to pt. She is having itching in vaginal area. I advised her I would send that in.   She also said that she is not staying asleep at night which causes her to fall asleep during the day. Says she talked about this with Dr Silvio Pate in the past and would like to have something called in. She is aware it could be Friday or later before I get back with her.

## 2022-04-02 NOTE — Telephone Encounter (Signed)
Spoke to pt. Tried to explain the reason she is not sleeping long at night is she is sleeping during the day. She has to break that cycle by forcing herself to stay awake all day and get up early. She was not real happy with the response but said she will try.

## 2022-04-07 ENCOUNTER — Encounter: Payer: Self-pay | Admitting: Internal Medicine

## 2022-04-07 DIAGNOSIS — I10 Essential (primary) hypertension: Secondary | ICD-10-CM | POA: Diagnosis not present

## 2022-04-07 DIAGNOSIS — N1831 Chronic kidney disease, stage 3a: Secondary | ICD-10-CM | POA: Diagnosis not present

## 2022-04-07 DIAGNOSIS — I129 Hypertensive chronic kidney disease with stage 1 through stage 4 chronic kidney disease, or unspecified chronic kidney disease: Secondary | ICD-10-CM | POA: Diagnosis not present

## 2022-04-07 DIAGNOSIS — R600 Localized edema: Secondary | ICD-10-CM | POA: Diagnosis not present

## 2022-04-19 ENCOUNTER — Encounter: Payer: Self-pay | Admitting: Internal Medicine

## 2022-04-20 ENCOUNTER — Encounter: Payer: Self-pay | Admitting: Internal Medicine

## 2022-04-21 NOTE — Telephone Encounter (Signed)
See other note

## 2022-04-24 NOTE — Addendum Note (Signed)
Addended by: Pilar Grammes on: 04/24/2022 09:32 AM   Modules accepted: Orders

## 2022-04-27 ENCOUNTER — Other Ambulatory Visit: Payer: Self-pay | Admitting: Internal Medicine

## 2022-05-02 ENCOUNTER — Other Ambulatory Visit: Payer: Self-pay | Admitting: Internal Medicine

## 2022-05-02 MED ORDER — HYDROCODONE-ACETAMINOPHEN 7.5-325 MG PO TABS: 1.0000 | ORAL_TABLET | Freq: Four times a day (QID) | ORAL | 0 refills | Status: DC | PRN

## 2022-05-02 NOTE — Telephone Encounter (Signed)
Name of Medication: Hydrocodone  Name of Pharmacy: [facility] [place] or Written Date and Quantity: 04-02-22 #120 Last Office Visit and Type: 02-19-22 Next Office Visit and Type: 07-01-22 Last Controlled Substance Agreement Date: N/A Last UDS: N/A

## 2022-05-02 NOTE — Telephone Encounter (Signed)
  Encourage patient to contact the pharmacy for refills or they can request refills through [facility]  Did the patient contact the pharmacy: No  LAST APPOINTMENT DATE: 02/19/22  NEXT APPOINTMENT DATE: 07/01/22  MEDICATION: HYDROcodone-acetaminophen (NORCO) 7.5-325 MG tablet  Is the patient out of medication? Yes  [id number] [place] [place] [facility] [address] Let patient know to contact pharmacy at the end of the day to make sure medication is ready.  Please notify patient to allow 48-72 hours to process

## 2022-05-09 ENCOUNTER — Telehealth: Payer: Self-pay | Admitting: Internal Medicine

## 2022-05-09 NOTE — Telephone Encounter (Signed)
Patient called in stating that she has ran out of rx  DULoxetine (CYMBALTA) 60 MG capsule,and her insurance will not pay for it until September 15. She needs a 2 week supply. She stated that if she really doesn't need it,she's not going to worry about it. She would like advice on what she should do.

## 2022-05-09 NOTE — Telephone Encounter (Signed)
I called and spoke to pt. To make sure she had not taken extra or lost some. She said she had not. [facility] and [facility] [place] [facility] Her last refill was 02-04-22 #90 so she is due for a refill. He is going to call her insurance for her. I called pt back and advised her to contact the pharmacy later today.

## 2022-05-16 ENCOUNTER — Other Ambulatory Visit: Payer: Self-pay | Admitting: Internal Medicine

## 2022-05-20 DIAGNOSIS — I442 Atrioventricular block, complete: Secondary | ICD-10-CM | POA: Diagnosis not present

## 2022-06-03 ENCOUNTER — Other Ambulatory Visit: Payer: Self-pay | Admitting: Internal Medicine

## 2022-06-03 MED ORDER — HYDROCODONE-ACETAMINOPHEN 7.5-325 MG PO TABS: 1.0000 | ORAL_TABLET | Freq: Four times a day (QID) | ORAL | 0 refills | Status: DC | PRN

## 2022-06-03 NOTE — Telephone Encounter (Signed)
Caller Name: daiana vitiello Call back phone #: [phone]  MEDICATION(S):  HYDROcodone-acetaminophen ([place]) 7.5-325 MG tablet  Days of Med Remaining: 0  Has the patient contacted their pharmacy (YES/NO)? YES What did pharmacy advise?  Pharmacy stated the 7.5 mg was out of stock, suggested ordering the '5mg'$  or '10mg'$ .   [facility] [place]  ~~~Please advise patient/caregiver to allow 2-3 business days to process RX refills.

## 2022-06-03 NOTE — Telephone Encounter (Signed)
Name of Medication: Hydrocodone  Name of Pharmacy: [facility] [place] or Written Date and Quantity: 05-02-22 #120 Last Office Visit and Type: 02-19-22 Next Office Visit and Type: 07-01-22 Last Controlled Substance Agreement Date: N/A Last UDS: N/A

## 2022-06-06 ENCOUNTER — Telehealth: Payer: Self-pay | Admitting: Internal Medicine

## 2022-06-06 MED ORDER — HYDROCODONE-ACETAMINOPHEN 5-325 MG PO TABS: 1.0000 | ORAL_TABLET | Freq: Four times a day (QID) | ORAL | 0 refills | Status: DC | PRN

## 2022-06-06 NOTE — Telephone Encounter (Signed)
Patient called and stated HYDROcodone-acetaminophen (NORCO) 7.5-325 MG tablet is on back order and PCP needs to prescribe 5 or 10 MG tablet because that's what they have in stock. Call back number [phone].

## 2022-06-06 NOTE — Telephone Encounter (Signed)
Spoke to pt. Stated she understood the new directions.

## 2022-06-06 NOTE — Telephone Encounter (Signed)
Please let her know I sent the '5mg'$  size She can take 1-1.5 tabs up to 4 times a day

## 2022-06-07 ENCOUNTER — Other Ambulatory Visit: Payer: Self-pay | Admitting: Internal Medicine

## 2022-06-12 ENCOUNTER — Other Ambulatory Visit: Payer: Self-pay | Admitting: Internal Medicine

## 2022-06-21 ENCOUNTER — Other Ambulatory Visit: Payer: Self-pay | Admitting: Internal Medicine

## 2022-06-27 ENCOUNTER — Other Ambulatory Visit: Payer: Self-pay | Admitting: Internal Medicine

## 2022-06-27 MED ORDER — HYDROCODONE-ACETAMINOPHEN 5-325 MG PO TABS
1.0000 | ORAL_TABLET | Freq: Four times a day (QID) | ORAL | 0 refills | Status: DC | PRN
Start: 2022-06-27 — End: 2022-07-25

## 2022-06-27 NOTE — Telephone Encounter (Signed)
Name of Medication: Hydrocodone 5-325 Name of Pharmacy: [facility] [place] or Written Date and Quantity: 06-06-22 #150 Last Office Visit and Type: 02-19-22 Next Office Visit and Type: 07-01-22 Last Controlled Substance Agreement Date: N/A Last UDS: N/A

## 2022-06-27 NOTE — Telephone Encounter (Signed)
  Encourage patient to contact the pharmacy for refills or they can request refills through [place]:  Please schedule appointment if longer than 1 year  NEXT APPOINTMENT DATE:  MEDICATION:HYDROcodone-acetaminophen (NORCO/VICODIN) 5-325 MG tablet  Is the patient out of medication?   [id number] [place] [place] [address]   Let patient know to contact pharmacy at the end of the day to make sure medication is ready.  Please notify patient to allow 48-72 hours to process  CLINICAL FILLS OUT ALL BELOW:   LAST REFILL:  QTY:  REFILL DATE:    OTHER COMMENTS:    Okay for refill?  Please advise

## 2022-07-01 ENCOUNTER — Ambulatory Visit (INDEPENDENT_AMBULATORY_CARE_PROVIDER_SITE_OTHER): Payer: Medicare Other | Admitting: Internal Medicine

## 2022-07-01 ENCOUNTER — Encounter: Payer: Medicare Other | Admitting: Internal Medicine

## 2022-07-01 ENCOUNTER — Encounter: Payer: Self-pay | Admitting: Internal Medicine

## 2022-07-01 VITALS — BP 112/74 | HR 98 | Temp 97.9°F | Ht 60.0 in | Wt 189.0 lb

## 2022-07-01 DIAGNOSIS — E1140 Type 2 diabetes mellitus with diabetic neuropathy, unspecified: Secondary | ICD-10-CM

## 2022-07-01 DIAGNOSIS — I442 Atrioventricular block, complete: Secondary | ICD-10-CM

## 2022-07-01 DIAGNOSIS — I5032 Chronic diastolic (congestive) heart failure: Secondary | ICD-10-CM | POA: Diagnosis not present

## 2022-07-01 DIAGNOSIS — N1832 Chronic kidney disease, stage 3b: Secondary | ICD-10-CM

## 2022-07-01 DIAGNOSIS — F3341 Major depressive disorder, recurrent, in partial remission: Secondary | ICD-10-CM

## 2022-07-01 DIAGNOSIS — Z0000 Encounter for general adult medical examination without abnormal findings: Secondary | ICD-10-CM

## 2022-07-01 DIAGNOSIS — G3184 Mild cognitive impairment, so stated: Secondary | ICD-10-CM | POA: Diagnosis not present

## 2022-07-01 DIAGNOSIS — M48062 Spinal stenosis, lumbar region with neurogenic claudication: Secondary | ICD-10-CM | POA: Diagnosis not present

## 2022-07-01 DIAGNOSIS — F1120 Opioid dependence, uncomplicated: Secondary | ICD-10-CM

## 2022-07-01 LAB — RENAL FUNCTION PANEL
Albumin: 4.2 g/dL (ref 3.5–5.2)
BUN: 33 mg/dL — ABNORMAL HIGH (ref 6–23)
CO2: 29 mEq/L (ref 19–32)
Calcium: 9.6 mg/dL (ref 8.4–10.5)
Chloride: 101 mEq/L (ref 96–112)
Creatinine, Ser: 1.07 mg/dL (ref 0.40–1.20)
GFR: 51.3 mL/min — ABNORMAL LOW (ref >60.00)
Glucose, Bld: 84 mg/dL (ref 70–99)
Phosphorus: 3.8 mg/dL (ref 2.3–4.6)
Potassium: 4.2 mEq/L (ref 3.5–5.1)
Sodium: 139 mEq/L (ref 135–145)

## 2022-07-01 LAB — HEPATIC FUNCTION PANEL
ALT: 11 U/L (ref 0–35)
AST: 13 U/L (ref 0–37)
Albumin: 4.2 g/dL (ref 3.5–5.2)
Alkaline Phosphatase: 82 U/L (ref 39–117)
Bilirubin, Direct: 0.1 mg/dL (ref 0.0–0.3)
Total Bilirubin: 0.4 mg/dL (ref 0.2–1.2)
Total Protein: 6.9 g/dL (ref 6.0–8.3)

## 2022-07-01 LAB — CBC
HCT: 44.6 % (ref 36.0–46.0)
Hemoglobin: 14.6 g/dL (ref 12.0–15.0)
MCHC: 32.7 g/dL (ref 30.0–36.0)
MCV: 94.3 fl (ref 78.0–100.0)
Platelets: 304 10*3/uL (ref 150.0–400.0)
RBC: 4.72 Mil/uL (ref 3.87–5.11)
RDW: 13.6 % (ref 11.5–15.5)
WBC: 10.7 10*3/uL — ABNORMAL HIGH (ref 4.0–10.5)

## 2022-07-01 LAB — MICROALBUMIN / CREATININE URINE RATIO
Creatinine,U: 95.6 mg/dL
Microalb Creat Ratio: 1.5 mg/g (ref 0.0–30.0)
Microalb, Ur: 1.5 mg/dL (ref 0.0–1.9)

## 2022-07-01 LAB — HEMOGLOBIN A1C: Hgb A1c MFr Bld: 6.6 % — ABNORMAL HIGH (ref 4.6–6.5)

## 2022-07-01 LAB — TSH: TSH: 1.64 u[IU]/mL (ref 0.35–5.50)

## 2022-07-01 LAB — LIPID PANEL
Cholesterol: 139 mg/dL (ref 0–200)
HDL: 67.6 mg/dL (ref >39.00)
LDL Cholesterol: 49 mg/dL (ref 0–99)
NonHDL: 71.71
Total CHOL/HDL Ratio: 2
Triglycerides: 114 mg/dL (ref 0.0–149.0)
VLDL: 22.8 mg/dL (ref 0.0–40.0)

## 2022-07-01 MED ORDER — LISINOPRIL-HYDROCHLOROTHIAZIDE 20-12.5 MG PO TABS: 1.0000 | ORAL_TABLET | Freq: Every day | ORAL | 3 refills | Status: DC

## 2022-07-01 NOTE — Progress Notes (Signed)
Subjective:    Patient ID: [name], female    DOB: [birth year], 76 y.o.   MRN: [id number]  HPI Here for Medicare wellness visit and follow up of chronic health conditions Reviewed advanced directives Reviewed other doctors---Dr Paraschos--cardiology, Dr Miguel Rota, Drs Chasnis/Morales---physiatry, [place] optometrist, Dr Shah--neurology, [facility], Drs Singh/Kolluru--nephrology Only surgery was pacemaker. No hospitalizations Not exercising--needs walker to get around. Discussed leg strengthening Vision okay Hearing not great--hearing aides didn't help No alcohol or tobacco No falls Doesn't drive. Can shop for food---or someone gets it for her Does do household tasks She is on donepezil---feels this has helped her memory  Feels she needs more hydrocodone Takes the 1.5 every time--and that does help (but can't get the 7.5) Discussed that the current supply is for 25 days  Checks sugars about monthly 90-110 No foot burning or numbness  No chest pain No palpitations Pacemaker placed  earlier this year No dizziness or syncope--some balance problems No major swelling  Depression persists to some degree Frustrated by functional limitations  Last GFR 43-50 Not taking meloxicam  Current Outpatient Medications on File Prior to Visit  Medication Sig Dispense Refill   Accu-Chek Softclix Lancets lancets Use as instructed 100 each 3   ARIPiprazole (ABILIFY) 2 MG tablet TAKE 1 TABLET BY MOUTH EVERYDAY AT BEDTIME 90 tablet 3   aspirin EC 81 MG tablet Take 1 tablet by mouth daily.     atorvastatin (LIPITOR) 80 MG tablet TAKE 1 TABLET BY MOUTH DAILY 100 tablet 3   Cholecalciferol (VITAMIN D3) 25 MCG (1000 UT) CAPS Take 1,000 Units by mouth daily.      clopidogrel (PLAVIX) 75 MG tablet Take 75 mg by mouth daily.     donepezil (ARICEPT) 10 MG tablet Take 10 mg by mouth every morning.     DULoxetine (CYMBALTA) 60 MG capsule TAKE 1 CAPSULE BY MOUTH DAILY 90 capsule 3    fluconazole (DIFLUCAN) 150 MG tablet TAKE 1 TABLET BY MOUTH ONE TIME PER WEEK 12 tablet 4   furosemide (LASIX) 20 MG tablet Take 20 mg by mouth daily as needed.     gabapentin (NEURONTIN) 300 MG capsule Take 3 capsules by mouth at bedtime.     HYDROcodone-acetaminophen (NORCO/VICODIN) 5-325 MG tablet Take 1-1.5 tablets by mouth every 6 (six) hours as needed for moderate pain. 150 tablet 0   lisinopril-hydrochlorothiazide (ZESTORETIC) 20-25 MG tablet TAKE 2 TABLETS BY MOUTH DAILY 180 tablet 0   Multiple Vitamins-Minerals (MULTIVITAMIN WITH MINERALS) tablet Take 1 tablet by mouth daily.      ONETOUCH VERIO test strip USE TO CHECK BLOOD SUGAR ONCE A DAY DX CODE E11.21 100 strip 12   Soya Lecithin 1200 MG CAPS Take 1,200 mg by mouth 3 (three) times daily.      vitamin C (ASCORBIC ACID) 500 MG tablet Take 500 mg by mouth daily.     No current facility-administered medications on file prior to visit.    No Known Allergies  Past Medical History:  Diagnosis Date   Arthritis    Confusion    Diabetes mellitus without complication ([place])    type 2   Hepatitis    pt. denies   Hypertension    Lumbar stenosis    Memory loss    Nocturia    Numbness and tingling    legs and feet   Wears glasses     Past Surgical History:  Procedure Laterality Date   [place]  COLONOSCOPY     HEMORRHOID SURGERY  2015   JOINT REPLACEMENT     01-08-18 Dr. Kathrynn Speed   PACEMAKER LEADLESS INSERTION N/A 12/06/2021   Procedure: PACEMAKER LEADLESS INSERTION;  Surgeon: Isaias Cowman, MD;  Location: [facility];  Service: Cardiovascular;  Laterality: N/A;   TOTAL HIP ARTHROPLASTY Left 01/08/2018   Procedure: LEFT TOTAL HIP ARTHROPLASTY ANTERIOR APPROACH;  Surgeon: Mcarthur Rossetti, MD;  [facility];  Service: Orthopedics;  Laterality: Left;   TRANSFORAMINAL LUMBAR INTERBODY FUSION (TLIF) WITH PEDICLE SCREW FIXATION 2 LEVEL N/A 11/07/2015   Procedure: Lumbar  three-four Lumbar four-five  transforaminal lumbar interbody fusion with interbody prosthesis posterior lateral arthrodesis and posterior segmental instrumentation;  Surgeon: Newman Pies, MD;  Location: [facility];  Service: Neurosurgery;  Laterality: N/A;    Family History  Problem Relation Age of Onset   Hypertension Other    Cancer Other        ovarian   Cancer Mother    Heart disease Father     Social History   Socioeconomic History   Marital status: Divorced    Spouse name: Not on file   Number of children: 4   Years of education: Not on file   Highest education level: Associate degree: occupational, Hotel manager, or vocational program  Occupational History   Not on file  Tobacco Use   Smoking status: Never    Passive exposure: Never   Smokeless tobacco: Never  Vaping Use   Vaping Use: Never used  Substance and Sexual Activity   Alcohol use: No    Comment: rarely   Drug use: No   Sexual activity: Not Currently  Other Topics Concern   Not on file  Social History Narrative   Has a living will.   Son is health care POA   Would accept resuscitation.     Would accept life support    No tube feeds if cognitively unaware   Social Determinants of Health   Financial Resource Strain: Low Risk  (11/20/2017)   Overall Financial Resource Strain (CARDIA)    Difficulty of Paying Living Expenses: Not very hard  Food Insecurity: No Food Insecurity (11/20/2017)   Hunger Vital Sign    Worried About Running Out of Food in the Last Year: Never true    Ran Out of Food in the Last Year: Never true  Transportation Needs: Unmet Transportation Needs (11/20/2017)   PRAPARE - Hydrologist (Medical): Yes    Lack of Transportation (Non-Medical): Yes  Physical Activity: Inactive (11/20/2017)   Exercise Vital Sign    Days of Exercise per Week: 0 days    Minutes of Exercise per Session: 0 min  Stress: Stress Concern Present (11/20/2017)   [place]    Feeling of Stress : To some extent  Social Connections: Unknown (11/20/2017)   Social Connection and Isolation Panel [NHANES]    Frequency of Communication with Friends and Family: Not on file    Frequency of Social Gatherings with Friends and Family: Not on file    Attends Religious Services: Never    Active Member of Clubs or Organizations: No    Attends Archivist Meetings: Never    Marital Status: Divorced  Human resources officer Violence: Not At Risk (11/20/2017)   Humiliation, Afraid, Rape, and Kick questionnaire    Fear of Current or Ex-Partner: No    Emotionally Abused: No    Physically  Abused: No    Sexually Abused: No   Review of Systems Appetite is good Weight up 10# in 6 months--discussed  Sleeps okay--uses gabapentin prn for sleep Wears seat belt sporadically--discussed  Teeth okay---partials upper and lower No heartburn or dysphagia Bowels move well or too well. Still with some urgency. No blood Voids okay---some some urge incontinence No worrisome skin lesions    Objective:   Physical Exam Constitutional:      Appearance: Normal appearance.  HENT:     Mouth/Throat:     Comments: No lesions Cardiovascular:     Rate and Rhythm: Normal rate and regular rhythm.     Heart sounds: No murmur heard.    No gallop.     Comments: Faint pedal pulses Abdominal:     Palpations: Abdomen is soft.     Tenderness: There is no abdominal tenderness.  Musculoskeletal:     Cervical back: Neck supple.     Right lower leg: No edema.     Left lower leg: No edema.  Lymphadenopathy:     Cervical: No cervical adenopathy.  Skin:    Findings: No lesion or rash.     Comments: No foot lesions  Neurological:     General: No focal deficit present.     Mental Status: She is alert.     Comments: Decreased sensation in feet  Psychiatric:        Mood and Affect: Mood normal.        Behavior: Behavior normal.             Assessment & Plan:

## 2022-07-01 NOTE — Assessment & Plan Note (Signed)
Doing better now with abilify '2mg'$  and the duloxetine

## 2022-07-01 NOTE — Assessment & Plan Note (Signed)
Ongoing pain issues Seeking more help Uses the hydrocodone 7.5 four times a day Duloxetine 60

## 2022-07-01 NOTE — Assessment & Plan Note (Addendum)
Compensated on furosemide 20 prn Lisinopril/HCTZ 20/25 two daily--will switch to 20/12.5

## 2022-07-01 NOTE — Progress Notes (Signed)
Hearing Screening - Comments:: Did not pass whisper test Vision Screening - Comments:: January 2023 (no diabetic exam)

## 2022-07-01 NOTE — Assessment & Plan Note (Signed)
I have personally reviewed the Medicare Annual Wellness questionnaire and have noted 1. The patient's medical and social history 2. Their use of alcohol, tobacco or illicit drugs 3. Their current medications and supplements 4. The patient's functional ability including ADL's, fall risks, home safety risks and hearing or visual             impairment. 5. Diet and physical activities 6. Evidence for depression or mood disorders  The patients weight, height, BMI and visual acuity have been recorded in the chart I have made referrals, counseling and provided education to the patient based review of the above and I have provided the pt with a written personalized care plan for preventive services.  I have provided you with a copy of your personalized plan for preventive services. Please take the time to review along with your updated medication list.  Prefers no flu, COVID or shingrix vaccines Doesn't want repeat colon despite past polyps Prefers no mammogram Discussed trying at least some leg strengthening

## 2022-07-01 NOTE — Assessment & Plan Note (Signed)
PDMP reviewed No concerns 

## 2022-07-01 NOTE — Assessment & Plan Note (Signed)
Is on the lisinopril

## 2022-07-01 NOTE — Assessment & Plan Note (Signed)
Vs early vascular dementia She feels donepezil has helped

## 2022-07-01 NOTE — Assessment & Plan Note (Signed)
Now with pacemaker

## 2022-07-01 NOTE — Assessment & Plan Note (Signed)
Seems to be well controlled without medications Gabapentin 900 at bedtime

## 2022-07-02 ENCOUNTER — Encounter: Payer: Self-pay | Admitting: Internal Medicine

## 2022-07-03 LAB — DRUG MONITORING, PANEL 8 WITH CONFIRMATION, URINE
6 Acetylmorphine: NEGATIVE ng/mL (ref <10)
Alcohol Metabolites: NEGATIVE ng/mL (ref <500)
Amphetamines: NEGATIVE ng/mL (ref <500)
Benzodiazepines: NEGATIVE ng/mL (ref <100)
Buprenorphine, Urine: NEGATIVE ng/mL (ref <5)
Cocaine Metabolite: NEGATIVE ng/mL (ref <150)
Codeine: NEGATIVE ng/mL (ref <50)
Creatinine: 94.1 mg/dL (ref >=20.0)
Hydrocodone: 3552 ng/mL — ABNORMAL HIGH (ref <50)
Hydromorphone: 494 ng/mL — ABNORMAL HIGH (ref <50)
MDMA: NEGATIVE ng/mL (ref <500)
Marijuana Metabolite: NEGATIVE ng/mL (ref <20)
Morphine: NEGATIVE ng/mL (ref <50)
Norhydrocodone: 2212 ng/mL — ABNORMAL HIGH (ref <50)
Opiates: POSITIVE ng/mL — AB (ref <100)
Oxidant: NEGATIVE ug/mL (ref <200)
Oxycodone: NEGATIVE ng/mL (ref <100)
pH: 5.8 (ref 4.5–9.0)

## 2022-07-03 LAB — DM TEMPLATE

## 2022-07-16 ENCOUNTER — Encounter: Payer: Self-pay | Admitting: Internal Medicine

## 2022-07-22 ENCOUNTER — Ambulatory Visit: Payer: Medicare Other | Admitting: Student in an Organized Health Care Education/Training Program

## 2022-07-25 ENCOUNTER — Encounter: Payer: Self-pay | Admitting: Internal Medicine

## 2022-07-25 MED ORDER — TRAMADOL HCL 50 MG PO TABS: 50.0000 mg | ORAL_TABLET | Freq: Three times a day (TID) | ORAL | 0 refills | Status: DC | PRN

## 2022-08-22 ENCOUNTER — Other Ambulatory Visit: Payer: Self-pay | Admitting: Internal Medicine

## 2022-08-23 ENCOUNTER — Encounter: Payer: Self-pay | Admitting: Internal Medicine

## 2022-08-29 ENCOUNTER — Encounter: Payer: Self-pay | Admitting: Primary Care

## 2022-08-29 ENCOUNTER — Ambulatory Visit (INDEPENDENT_AMBULATORY_CARE_PROVIDER_SITE_OTHER)
Admission: RE | Admit: 2022-08-29 | Discharge: 2022-08-29 | Disposition: A | Discharge status: Home / Self Care | Payer: Medicare Other | Source: Ambulatory Visit | Attending: Primary Care | Admitting: Primary Care

## 2022-08-29 ENCOUNTER — Ambulatory Visit (INDEPENDENT_AMBULATORY_CARE_PROVIDER_SITE_OTHER): Payer: Medicare Other | Admitting: Primary Care

## 2022-08-29 VITALS — BP 130/84 | HR 111 | Temp 98.4°F | Ht 60.0 in | Wt 190.0 lb

## 2022-08-29 DIAGNOSIS — R051 Acute cough: Secondary | ICD-10-CM | POA: Insufficient documentation

## 2022-08-29 DIAGNOSIS — R059 Cough, unspecified: Secondary | ICD-10-CM | POA: Diagnosis not present

## 2022-08-29 MED ORDER — BENZONATATE 200 MG PO CAPS: 200.0000 mg | ORAL_CAPSULE | Freq: Three times a day (TID) | ORAL | 0 refills | Status: DC | PRN

## 2022-08-29 MED ORDER — AMOXICILLIN-POT CLAVULANATE 875-125 MG PO TABS: 1.0000 | ORAL_TABLET | Freq: Two times a day (BID) | ORAL | 0 refills | Status: DC

## 2022-08-29 NOTE — Patient Instructions (Signed)
Complete xray(s) prior to leaving today. I will notify you of your results once received.  You may take Benzonatate capsules for cough. Take 1 capsule by mouth three times daily as needed for cough.  Start Augmentin antibiotics for the infection Take 1 tablet by mouth twice daily for 7 days.  It was a pleasure meeting you!

## 2022-08-29 NOTE — Progress Notes (Signed)
Subjective:    Patient ID: [name], female    DOB: 07/06/1946, 76 y.o.   MRN: [id number]  HPI  [name] is a very pleasant 76 y.o. female patient of Dr. Silvio Pate with a history of CHF, type 2 diabetes, hypertension, CKD, CVA who presents today to discuss cough.   Symptom onset two weeks ago with cough. She then developed, rhinorrhea, sore throat, chest congestion, fatigue.   She has never smoked. She has not tested for Covid-19 infection. Her cough has persisted and she's feeling worse.   She's been taking Robitussin and cough drops with no improvement. She denies shortness of breath.  Review of Systems  Constitutional:  Positive for fatigue. Negative for fever.  HENT:  Positive for congestion, postnasal drip and sore throat.   Respiratory:  Positive for cough.          Past Medical History:  Diagnosis Date   Arthritis    Confusion    Diabetes mellitus without complication (HCC)    type 2   Hepatitis    pt. denies   Hypertension    Lumbar stenosis    Memory loss    Nocturia    Numbness and tingling    legs and feet   Wears glasses     Social History   Socioeconomic History   Marital status: Divorced    Spouse name: Not on file   Number of children: 4   Years of education: Not on file   Highest education level: Associate degree: occupational, Hotel manager, or vocational program  Occupational History   Not on file  Tobacco Use   Smoking status: Never    Passive exposure: Never   Smokeless tobacco: Never  Vaping Use   Vaping Use: Never used  Substance and Sexual Activity   Alcohol use: No    Comment: rarely   Drug use: No   Sexual activity: Not Currently  Other Topics Concern   Not on file  Social History Narrative   Has a living will.   Son is health care POA   Would accept resuscitation.     Would accept life support    No tube feeds if cognitively unaware   Social Determinants of Health   Financial Resource Strain: Low Risk   (11/20/2017)   Overall Financial Resource Strain (CARDIA)    Difficulty of Paying Living Expenses: Not very hard  Food Insecurity: No Food Insecurity (11/20/2017)   Hunger Vital Sign    Worried About Running Out of Food in the Last Year: Never true    Ran Out of Food in the Last Year: Never true  Transportation Needs: Unmet Transportation Needs (11/20/2017)   PRAPARE - Hydrologist (Medical): Yes    Lack of Transportation (Non-Medical): Yes  Physical Activity: Inactive (11/20/2017)   Exercise Vital Sign    Days of Exercise per Week: 0 days    Minutes of Exercise per Session: 0 min  Stress: Stress Concern Present (11/20/2017)   [place]    Feeling of Stress : To some extent  Social Connections: Unknown (11/20/2017)   Social Connection and Isolation Panel [NHANES]    Frequency of Communication with Friends and Family: Not on file    Frequency of Social Gatherings with Friends and Family: Not on file    Attends Religious Services: Never    Active Member of Clubs or Organizations: No    Attends Club or  Organization Meetings: Never    Marital Status: Divorced  Human resources officer Violence: Not At Risk (11/20/2017)   Humiliation, Afraid, Rape, and Kick questionnaire    Fear of Current or Ex-Partner: No    Emotionally Abused: No    Physically Abused: No    Sexually Abused: No    Past Surgical History:  Procedure Laterality Date   [place]  2015   JOINT REPLACEMENT     01-08-18 Dr. Kathrynn Speed   PACEMAKER LEADLESS INSERTION N/A 12/06/2021   Procedure: PACEMAKER LEADLESS INSERTION;  Surgeon: Isaias Cowman, MD;  Location: [facility];  Service: Cardiovascular;  Laterality: N/A;   TOTAL HIP ARTHROPLASTY Left 01/08/2018   Procedure: LEFT TOTAL HIP ARTHROPLASTY ANTERIOR APPROACH;  Surgeon: Mcarthur Rossetti, MD;   Location: [facility];  Service: Orthopedics;  Laterality: Left;   TRANSFORAMINAL LUMBAR INTERBODY FUSION (TLIF) WITH PEDICLE SCREW FIXATION 2 LEVEL N/A 11/07/2015   Procedure: Lumbar three-four Lumbar four-five  transforaminal lumbar interbody fusion with interbody prosthesis posterior lateral arthrodesis and posterior segmental instrumentation;  Surgeon: Newman Pies, MD;  Location: [facility];  Service: Neurosurgery;  Laterality: N/A;    Family History  Problem Relation Age of Onset   Hypertension Other    Cancer Other        ovarian   Cancer Mother    Heart disease Father     No Known Allergies  Current Outpatient Medications on File Prior to Visit  Medication Sig Dispense Refill   Accu-Chek Softclix Lancets lancets Use as instructed 100 each 3   ARIPiprazole (ABILIFY) 2 MG tablet TAKE 1 TABLET BY MOUTH DAILY AT  BEDTIME 90 tablet 3   aspirin EC 81 MG tablet Take 1 tablet by mouth daily.     atorvastatin (LIPITOR) 80 MG tablet TAKE 1 TABLET BY MOUTH DAILY 100 tablet 3   Cholecalciferol (VITAMIN D3) 25 MCG (1000 UT) CAPS Take 1,000 Units by mouth daily.      donepezil (ARICEPT) 10 MG tablet Take 10 mg by mouth every morning.     DULoxetine (CYMBALTA) 60 MG capsule TAKE 1 CAPSULE BY MOUTH DAILY 90 capsule 3   fluconazole (DIFLUCAN) 150 MG tablet TAKE 1 TABLET BY MOUTH ONE TIME PER WEEK 12 tablet 4   furosemide (LASIX) 20 MG tablet Take 20 mg by mouth daily as needed.     gabapentin (NEURONTIN) 300 MG capsule Take 3 capsules by mouth at bedtime.     lisinopril-hydrochlorothiazide (ZESTORETIC) 20-12.5 MG tablet Take 1 tablet by mouth daily. 90 tablet 3   Multiple Vitamins-Minerals (MULTIVITAMIN WITH MINERALS) tablet Take 1 tablet by mouth daily.      ONETOUCH VERIO test strip USE TO CHECK BLOOD SUGAR ONCE A DAY DX CODE E11.21 100 strip 12   Soya Lecithin 1200 MG CAPS Take 1,200 mg by mouth 3 (three) times daily.      traMADol (ULTRAM) 50 MG tablet Take 1-2 tablets (50-100 mg total) by  mouth every 8 (eight) hours as needed. 180 tablet 0   vitamin C (ASCORBIC ACID) 500 MG tablet Take 500 mg by mouth daily.     clopidogrel (PLAVIX) 75 MG tablet Take 75 mg by mouth daily. (Patient not taking: Reported on 08/29/2022)     No current facility-administered medications on file prior to visit.    BP 130/84   Pulse (!) 111   Temp 98.4 F (36.9 C) (  Temporal)   Ht 5' (1.524 m)   Wt 190 lb (86.2 kg)   SpO2 95%   BMI 37.11 kg/m  Objective:   Physical Exam Constitutional:      Appearance: She is ill-appearing.  HENT:     Right Ear: Tympanic membrane and ear canal normal.     Left Ear: Tympanic membrane and ear canal normal.     Nose:     Right Sinus: No maxillary sinus tenderness or frontal sinus tenderness.     Left Sinus: No maxillary sinus tenderness or frontal sinus tenderness.     Mouth/Throat:     Pharynx: No posterior oropharyngeal erythema.  Eyes:     Conjunctiva/sclera: Conjunctivae normal.  Cardiovascular:     Rate and Rhythm: Normal rate and regular rhythm.  Pulmonary:     Effort: Pulmonary effort is normal.     Breath sounds: Examination of the right-upper field reveals rhonchi. Examination of the left-upper field reveals rhonchi. Examination of the right-lower field reveals rhonchi. Examination of the left-lower field reveals rhonchi. Rhonchi present. No wheezing.     Comments: Deep, congested cough noted several times during visit.  Appears exertionally short of breath while ambulating with walker.  Musculoskeletal:     Cervical back: Neck supple.  Lymphadenopathy:     Cervical: No cervical adenopathy.  Skin:    General: Skin is warm and dry.           Assessment & Plan:   Problem List Items Addressed This Visit       Other   Acute cough - Primary    Appears sickly, oxygen saturation is below baseline, and presentation is concerning.  Checking chest xray today for evaluation of pneumonia.   Start with Augmentin 875-125 mg BID x 7 day  course.  If chest xray is positive then add Azithromycin course.  Rx for Tessalon Perles 200 mg TID PRN sent to pharmacy.  Strict ED precautions provided.       Relevant Medications   amoxicillin-clavulanate (AUGMENTIN) 875-125 MG tablet   benzonatate (TESSALON) 200 MG capsule   Other Relevant Orders   DG Chest 2 View       Pleas Koch, NP

## 2022-08-29 NOTE — Assessment & Plan Note (Signed)
Appears sickly, oxygen saturation is below baseline, and presentation is concerning.  Checking chest xray today for evaluation of pneumonia.   Start with Augmentin 875-125 mg BID x 7 day course.  If chest xray is positive then add Azithromycin course.  Rx for Tessalon Perles 200 mg TID PRN sent to pharmacy.  Strict ED precautions provided.

## 2022-09-01 ENCOUNTER — Other Ambulatory Visit: Payer: Self-pay | Admitting: Internal Medicine

## 2022-09-02 ENCOUNTER — Telehealth: Payer: Self-pay | Admitting: Internal Medicine

## 2022-09-02 NOTE — Telephone Encounter (Signed)
Called and spoke to patient, she states she is currently still taking the Augmentin and Tessalon. She denies seeing any improvement, even in the slightest with taking these medications. She states she still has a lot of congestion and a productive cough. She is taking the Augmentin two times daily and still has three more days of this prescription left. Please advise.

## 2022-09-02 NOTE — Telephone Encounter (Signed)
Scheduled follow up with PCP 12/28 @ 11:15.

## 2022-09-02 NOTE — Telephone Encounter (Signed)
Recommend follow up with PCP.

## 2022-09-02 NOTE — Telephone Encounter (Signed)
Patient was seen on 12/22,and was prescribed an antibiotic and cough pills. She called in today to let Anda Kraft know that she is still coughing up a lot of mucus and would like to know if there is anything else that she can take?

## 2022-09-04 ENCOUNTER — Other Ambulatory Visit: Payer: Self-pay | Admitting: Internal Medicine

## 2022-09-04 ENCOUNTER — Encounter: Payer: Self-pay | Admitting: Internal Medicine

## 2022-09-04 ENCOUNTER — Ambulatory Visit (INDEPENDENT_AMBULATORY_CARE_PROVIDER_SITE_OTHER): Payer: Medicare Other | Admitting: Internal Medicine

## 2022-09-04 VITALS — BP 104/70 | HR 60 | Temp 97.9°F | Ht 60.0 in | Wt 194.0 lb

## 2022-09-04 DIAGNOSIS — J4520 Mild intermittent asthma, uncomplicated: Secondary | ICD-10-CM | POA: Diagnosis not present

## 2022-09-04 DIAGNOSIS — J45909 Unspecified asthma, uncomplicated: Secondary | ICD-10-CM | POA: Insufficient documentation

## 2022-09-04 MED ORDER — TRAMADOL HCL 50 MG PO TABS: 50.0000 mg | ORAL_TABLET | Freq: Three times a day (TID) | ORAL | 0 refills | Status: DC | PRN

## 2022-09-04 MED ORDER — AZITHROMYCIN 250 MG PO TABS: ORAL_TABLET | ORAL | 0 refills | Status: DC

## 2022-09-04 MED ORDER — PREDNISONE 20 MG PO TABS: 40.0000 mg | ORAL_TABLET | Freq: Every day | ORAL | 0 refills | Status: DC

## 2022-09-04 NOTE — Progress Notes (Signed)
   Subjective:    Patient ID: [name], female    DOB: [birth year], 76 y.o.   MRN: [id number]  HPI Here due to persistent respiratory symptoms  Started with symptoms 3 weeks ago Seen 1 week ago On augmentin--but still has "gook" in chest Yellow mucus No fever, chills but some sweats Does feel SOB  Some nasal congestion No headache or sinus pressure No ear pain  Using cough drops Using Rx cough medicine   Review of Systems Trouble sleeping at night due to cough Some nausea at times--not bad Had lost her taste but this is better =    Objective:   Physical Exam Constitutional:      Appearance: Normal appearance.  HENT:     Head:     Comments: No sinus tenderness    Right Ear: Tympanic membrane and ear canal normal.     Left Ear: Tympanic membrane and ear canal normal.     Mouth/Throat:     Pharynx: No oropharyngeal exudate or posterior oropharyngeal erythema.  Pulmonary:     Comments: Fair air movement Expiratory rhonchi and slight prolongation Neurological:     Mental Status: She is alert.            Assessment & Plan:

## 2022-09-04 NOTE — Telephone Encounter (Signed)
Last filled 07-25-22 #180 Last OV today Next OV 10-01-22 [facility]

## 2022-09-04 NOTE — Assessment & Plan Note (Signed)
Ongoing respiratory symptoms---all lower---though CXR was benign last week Will try z-pak and prednisone 40 x 3, 20 x 3

## 2022-09-06 ENCOUNTER — Encounter: Payer: Self-pay | Admitting: Internal Medicine

## 2022-09-08 ENCOUNTER — Other Ambulatory Visit: Payer: Self-pay | Admitting: Internal Medicine

## 2022-09-10 ENCOUNTER — Other Ambulatory Visit: Payer: Self-pay | Admitting: Internal Medicine

## 2022-09-15 DIAGNOSIS — I5032 Chronic diastolic (congestive) heart failure: Secondary | ICD-10-CM | POA: Diagnosis not present

## 2022-09-15 DIAGNOSIS — Z950 Presence of cardiac pacemaker: Secondary | ICD-10-CM | POA: Diagnosis not present

## 2022-09-15 DIAGNOSIS — Z23 Encounter for immunization: Secondary | ICD-10-CM | POA: Diagnosis not present

## 2022-09-15 DIAGNOSIS — I10 Essential (primary) hypertension: Secondary | ICD-10-CM | POA: Diagnosis not present

## 2022-09-15 DIAGNOSIS — I442 Atrioventricular block, complete: Secondary | ICD-10-CM | POA: Diagnosis not present

## 2022-09-15 DIAGNOSIS — E1140 Type 2 diabetes mellitus with diabetic neuropathy, unspecified: Secondary | ICD-10-CM | POA: Diagnosis not present

## 2022-09-15 DIAGNOSIS — I452 Bifascicular block: Secondary | ICD-10-CM | POA: Diagnosis not present

## 2022-09-18 ENCOUNTER — Encounter: Payer: Self-pay | Admitting: Internal Medicine

## 2022-09-23 ENCOUNTER — Encounter: Payer: Self-pay | Admitting: Internal Medicine

## 2022-09-25 ENCOUNTER — Ambulatory Visit (HOSPITAL_BASED_OUTPATIENT_CLINIC_OR_DEPARTMENT_OTHER): Payer: 59 | Admitting: Student in an Organized Health Care Education/Training Program

## 2022-09-25 ENCOUNTER — Ambulatory Visit
Admission: RE | Admit: 2022-09-25 | Discharge: 2022-09-25 | Disposition: A | Discharge status: Home / Self Care | Payer: 59 | Source: Ambulatory Visit | Attending: Student in an Organized Health Care Education/Training Program | Admitting: Student in an Organized Health Care Education/Training Program

## 2022-09-25 ENCOUNTER — Encounter: Payer: Self-pay | Admitting: Student in an Organized Health Care Education/Training Program

## 2022-09-25 VITALS — BP 138/57 | HR 102 | Temp 97.2°F | Resp 17 | Ht 60.0 in | Wt 190.0 lb

## 2022-09-25 DIAGNOSIS — G894 Chronic pain syndrome: Secondary | ICD-10-CM | POA: Insufficient documentation

## 2022-09-25 DIAGNOSIS — M5416 Radiculopathy, lumbar region: Secondary | ICD-10-CM | POA: Insufficient documentation

## 2022-09-25 DIAGNOSIS — M533 Sacrococcygeal disorders, not elsewhere classified: Secondary | ICD-10-CM | POA: Insufficient documentation

## 2022-09-25 DIAGNOSIS — G8929 Other chronic pain: Secondary | ICD-10-CM

## 2022-09-25 DIAGNOSIS — M5136 Other intervertebral disc degeneration, lumbar region: Secondary | ICD-10-CM | POA: Insufficient documentation

## 2022-09-25 DIAGNOSIS — M48061 Spinal stenosis, lumbar region without neurogenic claudication: Secondary | ICD-10-CM

## 2022-09-25 DIAGNOSIS — M961 Postlaminectomy syndrome, not elsewhere classified: Secondary | ICD-10-CM | POA: Insufficient documentation

## 2022-09-25 DIAGNOSIS — M48062 Spinal stenosis, lumbar region with neurogenic claudication: Secondary | ICD-10-CM

## 2022-09-25 DIAGNOSIS — Z981 Arthrodesis status: Secondary | ICD-10-CM | POA: Insufficient documentation

## 2022-09-25 DIAGNOSIS — M4804 Spinal stenosis, thoracic region: Secondary | ICD-10-CM | POA: Insufficient documentation

## 2022-09-25 NOTE — Progress Notes (Signed)
Safety precautions to be maintained throughout the outpatient stay will include: orient to surroundings, keep bed in low position, maintain call bell within reach at all times, provide assistance with transfer out of bed and ambulation.  

## 2022-09-25 NOTE — Patient Instructions (Addendum)
Get xrays of SI-J Follow up for [place]  What are the risk, side effects and possible complications? Generally speaking, most procedures are safe.  However, with any procedure there are risks, side effects, and the possibility of complications.  The risks and complications are dependent upon the sites that are lesioned, or the type of nerve block to be performed.  The closer the procedure is to the spine, the more serious the risks are.  Great care is taken when placing the radio frequency needles, block needles or lesioning probes, but sometimes complications can occur. Infection: Any time there is an injection through the skin, there is a risk of infection.  This is why sterile conditions are used for these blocks.  There are four possible types of infection. Localized skin infection. Central Nervous System Infection-This can be in the form of Meningitis, which can be deadly. Epidural Infections-This can be in the form of an epidural abscess, which can cause pressure inside of the spine, causing compression of the spinal cord with subsequent paralysis. This would require an emergency surgery to decompress, and there are no guarantees that the patient would recover from the paralysis. Discitis-This is an infection of the intervertebral discs.  It occurs in about 1% of discography procedures.  It is difficult to treat and it may lead to surgery.        2. Pain: the needles have to go through skin and soft tissues, will cause soreness.       3. Damage to internal structures:  The nerves to be lesioned may be near blood vessels or    other nerves which can be potentially damaged.       4. Bleeding: Bleeding is more common if the patient is taking blood thinners such as  aspirin, Coumadin, Ticiid, Plavix, etc., or if he/she have some genetic predisposition  such as hemophilia. Bleeding into the spinal canal can cause compression of the spinal  cord with subsequent  paralysis.  This would require an emergency surgery to  decompress and there are no guarantees that the patient would recover from the  paralysis.       5. Pneumothorax:  Puncturing of a lung is a possibility, every time a needle is introduced in  the area of the chest or upper back.  Pneumothorax refers to free air around the  collapsed lung(s), inside of the thoracic cavity (chest cavity).  Another two possible  complications related to a similar event would include: Hemothorax and Chylothorax.   These are variations of the Pneumothorax, where instead of air around the collapsed  lung(s), you may have blood or chyle, respectively.       6. Spinal headaches: They may occur with any procedures in the area of the spine.       7. Persistent CSF (Cerebro-Spinal Fluid) leakage: This is a rare problem, but may occur  with prolonged intrathecal or epidural catheters either due to the formation of a fistulous  track or a dural tear.       8. Nerve damage: By working so close to the spinal cord, there is always a possibility of  nerve damage, which could be as serious as a permanent spinal cord injury with  paralysis.       9. Death:  Although rare, severe deadly allergic reactions known as "Anaphylactic  reaction" can occur to any of the medications used.      10. Worsening of the symptoms:  We  can always make thing worse.  What are the chances of something like this happening? Chances of any of this occuring are extremely low.  By statistics, you have more of a chance of getting killed in a motor vehicle accident: while driving to the hospital than any of the above occurring .  Nevertheless, you should be aware that they are possibilities.  In general, it is similar to taking a shower.  Everybody knows that you can slip, hit your head and get killed.  Does that mean that you should not shower again?  Nevertheless always keep in mind that statistics do not mean anything if you happen to be on the wrong side of  them.  Even if a procedure has a 1 (one) in a 1,000,000 (million) chance of going wrong, it you happen to be that one..Also, keep in mind that by statistics, you have more of a chance of having something go wrong when taking medications.  Who should not have this procedure? If you are on a blood thinning medication (e.g. Coumadin, Plavix, see list of "Blood Thinners"), or if you have an active infection going on, you should not have the procedure.  If you are taking any blood thinners, please inform your physician.  How should I prepare for this procedure? Do not eat or drink anything at least six hours prior to the procedure. Bring a driver with you .  It cannot be a taxi. Come accompanied by an adult that can drive you back, and that is strong enough to help you if your legs get weak or numb from the local anesthetic. Take all of your medicines the morning of the procedure with just enough water to swallow them. If you have diabetes, make sure that you are scheduled to have your procedure done first thing in the morning, whenever possible. If you have diabetes, take only half of your insulin dose and notify our nurse that you have done so as soon as you arrive at the clinic. If you are diabetic, but only take blood sugar pills (oral hypoglycemic), then do not take them on the morning of your procedure.  You may take them after you have had the procedure. Do not take aspirin or any aspirin-containing medications, at least eleven (11) days prior to the procedure.  They may prolong bleeding. Wear loose fitting clothing that may be easy to take off and that you would not mind if it got stained with Betadine or blood. Do not wear any jewelry or perfume Remove any nail coloring.  It will interfere with some of our monitoring equipment.  NOTE: Remember that this is not meant to be interpreted as a complete list of all possible complications.  Unforeseen problems may occur.  BLOOD THINNERS The  following drugs contain aspirin or other products, which can cause increased bleeding during surgery and should not be taken for 2 weeks prior to and 1 week after surgery.  If you should need take something for relief of minor pain, you may take acetaminophen which is found in Tylenol,m Datril, Anacin-3 and Panadol. It is not blood thinner. The products listed below are.  Do not take any of the products listed below in addition to any listed on your instruction sheet.  A.P.C or A.P.C with Codeine Codeine Phosphate Capsules #3 Ibuprofen Ridaura  ABC compound Congesprin Imuran rimadil  Advil Cope Indocin Robaxisal  Alka-Seltzer Effervescent Pain Reliever and Antacid Coricidin or Coricidin-D  Indomethacin Rufen  Alka-Seltzer plus Cold Medicine Cosprin  Ketoprofen S-A-C Tablets  Anacin Analgesic Tablets or Capsules Coumadin Korlgesic Salflex  Anacin Extra Strength Analgesic tablets or capsules CP-2 Tablets Lanoril Salicylate  Anaprox Cuprimine Capsules Levenox Salocol  Anexsia-D Dalteparin Magan Salsalate  Anodynos Darvon compound Magnesium Salicylate Sine-off  Ansaid Dasin Capsules Magsal Sodium Salicylate  Anturane Depen Capsules Marnal Soma  APF Arthritis pain formula Dewitt's Pills Measurin Stanback  Argesic Dia-Gesic Meclofenamic Sulfinpyrazone  Arthritis Bayer Timed Release Aspirin Diclofenac Meclomen Sulindac  Arthritis pain formula Anacin Dicumarol Medipren Supac  Analgesic (Safety coated) Arthralgen Diffunasal Mefanamic Suprofen  Arthritis Strength Bufferin Dihydrocodeine Mepro Compound Suprol  Arthropan liquid Dopirydamole Methcarbomol with Aspirin Synalgos  ASA tablets/Enseals Disalcid Micrainin Tagament  Ascriptin Doan's Midol Talwin  Ascriptin A/D Dolene Mobidin Tanderil  Ascriptin Extra Strength Dolobid Moblgesic Ticlid  Ascriptin with Codeine Doloprin or Doloprin with Codeine Momentum Tolectin  Asperbuf Duoprin Mono-gesic Trendar  Aspergum Duradyne Motrin or Motrin IB Triminicin   Aspirin plain, buffered or enteric coated Durasal Myochrisine Trigesic  Aspirin Suppositories Easprin Nalfon Trillsate  Aspirin with Codeine Ecotrin Regular or Extra Strength Naprosyn Uracel  Atromid-S Efficin Naproxen Ursinus  Auranofin Capsules Elmiron Neocylate Vanquish  Axotal Emagrin Norgesic Verin  Azathioprine Empirin or Empirin with Codeine Normiflo Vitamin E  Azolid Emprazil Nuprin Voltaren  Bayer Aspirin plain, buffered or children's or timed BC Tablets or powders Encaprin Orgaran Warfarin Sodium  Buff-a-Comp Enoxaparin Orudis Zorpin  Buff-a-Comp with Codeine Equegesic Os-Cal-Gesic   Buffaprin Excedrin plain, buffered or Extra Strength Oxalid   Bufferin Arthritis Strength Feldene Oxphenbutazone   Bufferin plain or Extra Strength Feldene Capsules Oxycodone with Aspirin   Bufferin with Codeine Fenoprofen Fenoprofen Pabalate or Pabalate-SF   Buffets II Flogesic Panagesic   Buffinol plain or Extra Strength Florinal or Florinal with Codeine Panwarfarin   Buf-Tabs Flurbiprofen Penicillamine   Butalbital Compound Four-way cold tablets Penicillin   Butazolidin Fragmin Pepto-Bismol   Carbenicillin Geminisyn Percodan   Carna Arthritis Reliever Geopen Persantine   Carprofen Gold's salt Persistin   Chloramphenicol Goody's Phenylbutazone   Chloromycetin Haltrain Piroxlcam   Clmetidine heparin Plaquenil   Cllnoril Hyco-pap Ponstel   Clofibrate Hydroxy chloroquine Propoxyphen         Before stopping any of these medications, be sure to consult the physician who ordered them.  Some, such as Coumadin (Warfarin) are ordered to prevent or treat serious conditions such as "deep thrombosis", "pumonary embolisms", and other heart problems.  The amount of time that you may need off of the medication may also vary with the medication and the reason for which you were taking it.  If you are taking any of these medications, please make sure you notify your pain physician before you undergo any  procedures.         Epidural Steroid Injection Patient Information  Description: The epidural space surrounds the nerves as they exit the spinal cord.  In some patients, the nerves can be compressed and inflamed by a bulging disc or a tight spinal canal (spinal stenosis).  By injecting steroids into the epidural space, we can bring irritated nerves into direct contact with a potentially helpful medication.  These steroids act directly on the irritated nerves and can reduce swelling and inflammation which often leads to decreased pain.  Epidural steroids may be injected anywhere along the spine and from the neck to the low back depending upon the location of your pain.   After numbing the skin with local anesthetic (like Novocaine), a small needle is passed into the epidural space slowly.  You may experience a sensation of pressure while this is being done.  The entire block usually last less than 10 minutes.  Conditions which may be treated by epidural steroids:  Low back and leg pain Neck and arm pain Spinal stenosis Post-laminectomy syndrome Herpes zoster (shingles) pain Pain from compression fractures  Preparation for the injection:  Do not eat any solid food or dairy products within 8 hours of your appointment.  You may drink clear liquids up to 3 hours before appointment.  Clear liquids include water, black coffee, juice or soda.  No milk or cream please. You may take your regular medication, including pain medications, with a sip of water before your appointment  Diabetics should hold regular insulin (if taken separately) and take 1/2 normal NPH dos the morning of the procedure.  Carry some sugar containing items with you to your appointment. A driver must accompany you and be prepared to drive you home after your procedure.  Bring all your current medications with your. An IV may be inserted and sedation may be given at the discretion of the physician.   A blood pressure cuff,  EKG and other monitors will often be applied during the procedure.  Some patients may need to have extra oxygen administered for a short period. You will be asked to provide medical information, including your allergies, prior to the procedure.  We must know immediately if you are taking blood thinners (like Coumadin/Warfarin)  Or if you are allergic to IV iodine contrast (dye). We must know if you could possible be pregnant.  Possible side-effects: Bleeding from needle site Infection (rare, may require surgery) Nerve injury (rare) Numbness & tingling (temporary) Difficulty urinating (rare, temporary) Spinal headache ( a headache worse with upright posture) Light -headedness (temporary) Pain at injection site (several days) Decreased blood pressure (temporary) Weakness in arm/leg (temporary) Pressure sensation in back/neck (temporary)  Call if you experience: Fever/chills associated with headache or increased back/neck pain. Headache worsened by an upright position. New onset weakness or numbness of an extremity below the injection site Hives or difficulty breathing (go to the emergency room) Inflammation or drainage at the infection site Severe back/neck pain Any new symptoms which are concerning to you  Please note:  Although the local anesthetic injected can often make your back or neck feel good for several hours after the injection, the pain will likely return.  It takes 3-7 days for steroids to work in the epidural space.  You may not notice any pain relief for at least that one week.  If effective, we will often do a series of three injections spaced 3-6 weeks apart to maximally decrease your pain.  After the initial series, we generally will wait several months before considering a repeat injection of the same type.  If you have any questions, please call [phone] [facility]

## 2022-09-25 NOTE — Progress Notes (Signed)
Patient: [name]  Service Category: E/M  Provider: Gillis Santa, MD  DOB: 1945-10-10  DOS: 09/25/2022  Referring Provider: Venia Carbon, MD  MRN: [id number]  Setting: Ambulatory outpatient  PCP: Venia Carbon, MD  Type: New Patient  Specialty: Interventional Pain Management    Location: Office  Delivery: Face-to-face     Primary Reason(s) for Visit: Encounter for initial evaluation of one or more chronic problems (new to examiner) potentially causing chronic pain, and posing a threat to normal musculoskeletal function. (Level of risk: High) CC: Back Pain (lower)  HPI  Ms. Petrak is a 77 y.o. year old, female patient, who comes for the first time to our practice referred by Venia Carbon, MD for our initial evaluation of her chronic pain. She has Essential hypertension, benign; Failed back surgical syndrome; IBS (irritable bowel syndrome); MCI (mild cognitive impairment); Type 2 diabetes, controlled, with neuropathy ([place]); Osteoarthritis, hip, bilateral; Peripheral neuropathy; Preventative health care; Chronic back pain; Lumbar spinal stenosis due to adjacent segment disease after fusion procedure; Advance directive discussed with patient; MDD (major depressive disorder), recurrent, in partial remission ([place]); Chronic pain syndrome; Facet arthropathy, lumbar; Status post total replacement of left hip; Urge incontinence; Venous insufficiency; Chronic narcotic dependence ([place]); Chronic diastolic CHF (congestive heart failure) ([place]); Stage 3b chronic kidney disease ([place]); Type II diabetes mellitus with renal manifestations ([place]); Abnormal MRI; History of stroke; Candidal intertrigo; Complete heart block ([place]); HLD (hyperlipidemia); Depression; Memory loss; Bradycardia; Elevated troponin; Acute cough; Asthmatic bronchitis; Chronic radicular lumbar pain; History of lumbar fusion (L3-L5); Foraminal stenosis of thoracic region; and Chronic SI joint pain on their problem list. Today she comes in  for evaluation of her Back Pain (lower)  Pain Assessment: Location: Lower Back Radiating: radiates down back of legs to knees Onset: More than a month ago Duration: Chronic pain Quality: Aching Severity: 7 /10 (subjective, self-reported pain score)  Effect on ADL: limits daily activities; pt states she is unable to walk without walker Timing: Constant Modifying factors: sitting, Tramadol, Tylenol BP: (!) 138/57  HR: (!) 102  Onset and Duration: Present longer than 3 months Cause of pain: Surgery Severity: No change since onset, NAS-11 at its worse: 7/10, and NAS-11 at its best: 0/10 Timing: Morning, Afternoon, Night, and During activity or exercise Aggravating Factors: Bending, Climbing, Lifiting, Motion, and Prolonged standing Alleviating Factors: Medications and Sitting Associated Problems: Fatigue, Inability to concentrate, Inability to control bladder (urine), Inability to control bowel, and Sweating Quality of Pain: Aching, Annoying, Constant, and Dreadful Previous Examinations or Tests: CT scan and MRI scan Previous Treatments: Narcotic medications, Physical Therapy, Radiofrequency, Relaxation therapy, Strengthening exercises, Stretching exercises, and TENS  BETHANNY TOELLE presents today with lower back pain with radiation in back buttocks and posterior legs to knees. History of lumbar spinal fusion (L3-5). Unable to ambulate without a walker and develops weakness of bilateral legs with even walking a short distance. Currently managed on medications with PCP including Cymbalta 60 mg daily, Gabapentin 900 mg nightly, and Tramadol 50 mg 4-6 hrs prn.  She has tried physical therapy in the past with limited response.  MRI below shows spondylosis given adjacent segment disease above lumbar fusion spinal fusion as well as thoracic canal stenosis.  Meds   Current Outpatient Medications:    Accu-Chek Softclix Lancets lancets, Use as instructed, Disp: 100 each, Rfl: 3   amLODipine  (NORVASC) 5 MG tablet, Take 5 mg by mouth daily., Disp: , Rfl:    ARIPiprazole (ABILIFY) 2 MG  tablet, TAKE 1 TABLET BY MOUTH DAILY AT  BEDTIME, Disp: 90 tablet, Rfl: 3   aspirin EC 81 MG tablet, Take 1 tablet by mouth daily., Disp: , Rfl:    atorvastatin (LIPITOR) 80 MG tablet, TAKE 1 TABLET BY MOUTH DAILY, Disp: 100 tablet, Rfl: 3   donepezil (ARICEPT) 10 MG tablet, Take 10 mg by mouth every morning., Disp: , Rfl:    DULoxetine (CYMBALTA) 60 MG capsule, TAKE 1 CAPSULE BY MOUTH DAILY, Disp: 90 capsule, Rfl: 3   gabapentin (NEURONTIN) 300 MG capsule, Take 3 capsules (900 mg total) by mouth at bedtime., Disp: 300 capsule, Rfl: 3   lisinopril-hydrochlorothiazide (ZESTORETIC) 20-12.5 MG tablet, Take 1 tablet by mouth daily., Disp: 90 tablet, Rfl: 3   Multiple Vitamins-Minerals (MULTIVITAMIN WITH MINERALS) tablet, Take 1 tablet by mouth daily. , Disp: , Rfl:    ONETOUCH VERIO test strip, USE TO CHECK BLOOD SUGAR ONCE A DAY DX CODE E11.21, Disp: 100 strip, Rfl: 12   traMADol (ULTRAM) 50 MG tablet, Take 1-2 tablets (50-100 mg total) by mouth every 8 (eight) hours as needed., Disp: 180 tablet, Rfl: 0   azithromycin (ZITHROMAX Z-PAK) 250 MG tablet, Take 2 tablets (500 mg) on  Day 1,  followed by 1 tablet (250 mg) once daily on Days 2 through 5. (Patient not taking: Reported on 09/25/2022), Disp: 6 each, Rfl: 0   clopidogrel (PLAVIX) 75 MG tablet, Take 75 mg by mouth daily. (Patient not taking: Reported on 09/25/2022), Disp: , Rfl:   Imaging Review  DG Shoulder Left  Narrative *RADIOLOGY REPORT*  Clinical Data: Follow-up fracture.  LEFT SHOULDER - 2+ VIEW  Comparison: 11/15/2012  Findings: Fracture through the greater tuberosity of the left proximal humerus again noted.  Slight depression at the greater tuberosity superiorly.  Fracture line remains at least partially evident.  No new acute bony abnormality.  No subluxation or dislocation.  IMPRESSION: Left proximal humeral greater tuberosity  fracture again noted and remains partially evident.  Slight depression of the fracture at the superior aspect.   Original Report Authenticated By: Rolm Baptise, M.D.   MR Lumbar Spine Wo Contrast  Narrative CLINICAL DATA:  Chronic low back pain.  Weakness of the left foot.  EXAM: MRI LUMBAR SPINE WITHOUT CONTRAST  TECHNIQUE: Multiplanar, multisequence MR imaging of the lumbar spine was performed. No intravenous contrast was administered.  COMPARISON:  CT myelography 10/28/2016.  Lumbar MRI 05/21/2015.  FINDINGS: Segmentation:  5 lumbar type vertebral bodies.  Alignment: Thoracolumbar curvature convex to the left with the apex at T12. Lower lumbar curvature convex to the right.  Vertebrae:  Previous discectomy and fusion procedure from L3-L5.  Conus medullaris and cauda equina: Conus extends to the L1 level. Conus and cauda equina appear normal.  Paraspinal and other soft tissues: Renal cysts.  Otherwise negative.  Disc levels:  T10-11: Bulging of the disc. Facet osteoarthritis. No apparent compressive stenosis.  T11-12: Right posterolateral disc herniation with extension towards the proximal foramen. No compressive effect upon the cord. The T11 nerve appears to exit above this.  T12-L1: Loss of disc height. Endplate osteophytes and bulging of the disc. No compressive stenosis.  L1-2: Loss of disc height. Endplate osteophytes and bulging of the disc. No compressive stenosis.  L2-3: Bulging of the disc. Facet and ligamentous hypertrophy. Mild multifactorial stenosis. Lateral recess narrowing and foraminal narrowing appears more pronounced on the left.  L3 through L5: Previous posterior decompression, diskectomy and fusion procedure. Apparent sufficient patency of the canal and foramina.  L5-S1: Bulging of the disc. Facet degeneration. No central canal stenosis. Foraminal narrowing that would have some potential to affect the exiting L5  nerves.  IMPRESSION: Previous PLIF L3-L5. Apparent sufficient patency of the canal and foramina through that segment.  L5-S1 disc bulge and facet degeneration. Foraminal narrowing that would have some potential to affect the exiting L5 nerves.  L2-3 disc bulge and facet hypertrophy. Mild multifactorial stenosis slightly worse on the left. Some potential for nerve compression on the left.  Lower thoracic and upper lumbar degenerative changes without likely neural compression.   Electronically Signed By: Nelson Chimes M.D. On: 04/11/2018 21:07   Narrative CLINICAL DATA:  77 year old female with chronic back pain, prior surgery.  EXAM: MRI LUMBAR SPINE WITHOUT AND WITH CONTRAST  TECHNIQUE: Multiplanar and multiecho pulse sequences of the lumbar spine were obtained without and with intravenous contrast.  CONTRAST:  7.74m GADAVIST GADOBUTROL 1 MMOL/ML IV SOLN  COMPARISON:  Lumbar CT myelogram 10/28/2016.  Lumbar MRI 04/11/2018.  FINDINGS: Segmentation: Normal on the comparison CT myelogram, which is the same numbering system used on the 2019 MRI.  Alignment: Chronic levoconvex lower thoracic and dextroconvex lumbar scoliosis. Chronic grade 1 anterolisthesis at L4-L5 and L5-S1. New grade 1 anterolisthesis at T10-T11 since 2019, 3-4 mm. And mild anterolisthesis also at T8-T9 and T9-T10.  Vertebrae: Hardware susceptibility artifact related to chronic L3 through L5 fusion hardware. There does appear to be superimposed degenerative marrow edema at the S1 superior endplate. Faint degenerative endplate marrow edema also at T10-T11. Chronic degenerative endplate marrow signal changes elsewhere. Normal background bone marrow signal. Intact visible sacrum.  Conus medullaris and cauda equina: Conus extends to the T12-L1 level. No lower spinal cord or conus signal abnormality. No abnormal intradural enhancement. No dural thickening identified.  Paraspinal and other soft  tissues: Postoperative changes to the lumbar paraspinal soft tissues. Stable and negative visible abdominal viscera.  Disc levels:  Lower thoracic levels T8-T9 through T10-T11 demonstrate anterolisthesis and disc space loss. New heterogeneous increased T2 and STIR hyperintensity in the T10-T11 disc although no associated enhancement or paraspinal inflammation. Posterior disc bulging at each of those levels combined with posterior element hypertrophy appears to result in mild spinal stenosis at both T8-T9 and T10-T11 (series 9, image 10) which is new since 2019. Possible mild spinal cord mass effect. No cord signal abnormality. There is moderate to severe left T8, bilateral T9, and left T10 neural foraminal stenosis.  T11-T12: Disc space loss and circumferential disc bulge eccentric to the right appears stable since 2019 with superimposed facet hypertrophy, mild right T11 foraminal stenosis.  T12-L1: Stable disc space loss and circumferential disc osteophyte complex with mild right T12 foraminal stenosis.  L1-L2: Stable disc space loss and circumferential disc osteophyte complex with borderline to mild spinal stenosis.  L2-L3: Chronic disc space loss with circumferential disc osteophyte complex eccentric to the left. Mild to moderate facet and ligament flavum hypertrophy greater on the left. Chronic left lateral recess stenosis is moderate to severe (left L3 nerve level). But only mild spinal and left L2 foraminal stenosis. This level has not significantly changed.  L3-L4:  Stable prior decompression and fusion.  L4-L5:  Stable prior decompression and fusion.  L5-S1: Chronic anterolisthesis with progressed disc desiccation and vacuum disc since 2019. Circumferential disc/pseudo disc with bulky bilateral foraminal involvement. Mild facet hypertrophy. No spinal or lateral recess stenosis but moderate to severe left greater than right L5 foraminal stenosis may have  increased.  IMPRESSION: 1. Stable prior decompression  and fusion at L3-L4 and L4-L5.  2. Progressed adjacent segment disease at L5-S1 with disc space loss and vacuum disc since 2019. Moderate to severe bilateral foraminal stenosis at that level may have progressed, greater on the left.  3. Stable adjacent segment disease at L2-L3 with moderate to severe left lateral recess stenosis.  4. Progressed lower thoracic spine degeneration since 2019 with new grade 1 anterolisthesis at T10-T11, and advanced chronic disc space loss and mild anterolisthesis at T8-T9 and T9-T10. Subsequent multifactorial mild spinal stenosis at both T8-T9 and T10-T11 with possible mild spinal cord mass effect at both levels. No cord signal abnormality. Moderate to severe left T8, bilateral T9, and left T10 neural foraminal stenosis.   Electronically Signed By: Genevie Ann M.D. On: 10/14/2021 07:25   Narrative CLINICAL DATA:  Previous lumbar discectomy and fusion. Low back pain radiating to the hips. Patient does not described pain radiating to the feet.  EXAM: LUMBAR MYELOGRAM  FLUOROSCOPY TIME:  0 minutes 45 seconds. 224.26 micro gray meter squared  PROCEDURE: After thorough discussion of risks and benefits of the procedure including bleeding, infection, injury to nerves, blood vessels, adjacent structures as well as headache and CSF leak, written and oral informed consent was obtained. Consent was obtained by Dr. Nelson Chimes. Time out form was completed.  Patient was positioned prone on the fluoroscopy table. Local anesthesia was provided with 1% lidocaine without epinephrine after prepped and draped in the usual sterile fashion. Puncture was performed at L5-S1 using a 3 1/2 inch 22-gauge spinal needle via right para median approach. Using a single pass through the dura, the needle was placed within the thecal sac, with return of clear CSF. 15 mL of Isovue 200 was injected into the thecal sac,  with normal opacification of the nerve roots and cauda equina consistent with free flow within the subarachnoid space.  I personally performed the lumbar puncture and administered the intrathecal contrast. I also personally performed acquisition of the myelogram images.  TECHNIQUE: Contiguous axial images were obtained through the Lumbar spine after the intrathecal infusion of infusion. Coronal and sagittal reconstructions were obtained of the axial image sets.  COMPARISON:  Previous post operative radiographs.  Preoperative MRI.  FINDINGS: LUMBAR MYELOGRAM FINDINGS:  Curvature convex to the right with the apex at L2-3 as seen previously. Thoracolumbar curvature convex to the left as seen previously. Left lateral recess narrowing. Left lateral recess narrowing at L1-2 and L2-3 with anterior extradural defects. Wide patency of the central canal in the fusion segment from L3 through L5. Partial compression/endplate collapse at L5 as seen previously. L4 is 7 mm anterior relative to L5. Small anterior extradural defect L5-S1 without apparent neural compression. No motion occurs with flexion and extension.  CT LUMBAR MYELOGRAM FINDINGS:  T11-12: Disc degeneration with vacuum phenomenon. Shallow protrusion of the disc indents the ventral subarachnoid space but does not compress the cord or show foraminal compromise.  T12-L1: Disc degeneration more pronounced on the right with loss of height and vacuum phenomenon. Endplate osteophytes and bulging of the disc. Narrowing of the ventral subarachnoid space. No compressive stenosis.  L1-2: Disc degeneration more pronounced on the right with loss of height and vacuum phenomenon. Endplate osteophytes and bulging of the disc indent the thecal sac but do not appear to cause neural compression. Mild bony foraminal narrowing on the left.  L2-3: Disc degeneration more pronounced on the left with bulging of the disc. Facet and ligamentous  hypertrophy. Mild stenosis of the left lateral  recess and intervertebral foramen on the left without visible neural compression.  L3-4: Previous discectomy, posterior decompression and fusion procedure. No evidence of motion at this level. No stenosis.  L4-5: Previous diskectomy, decompression and fusion. No evidence of motion at this level. L4 is 7 mm anterior relative to L5. Since the intraoperative films but present on subsequent postoperative evaluations, there has been both superior and inferior endplate collapse at L5, though this appears healed.  L5-S1: Circumferential bulging of the disc. Facet and ligamentous hypertrophy. Central canal is widely patent. L5 nerve roots appear to exit without compression.  Sacroiliac joints show only minimal osteoarthritis.  IMPRESSION: Thoracolumbar curvature convex to the left and lumbar curvature convex to the right. Right-sided predominant degenerative changes at T12-L1 and L1-2 but without apparent neural compression. Left-sided predominant degenerative change at L2-3 with lateral recess and foraminal narrowing on the left that could possibly be symptomatic.  Diskectomy, decompression and fusion from L3-L5. Sufficient patency of the central canal. No evidence of nonunion or ongoing motion. Endplate collapse is at L5 are apparently old and healed.  Disc bulge and facet osteoarthritis at L5-S1 but without evidence of neural compression.   Electronically Signed By: Nelson Chimes M.D. On: 10/28/2016 14:58   Narrative CLINICAL DATA:  Intraoperative localization film patient for L3-4 and L4-5 fusion.  EXAM: LUMBAR SPINE - 1 VIEW  COMPARISON:  MRI lumbar spine 05/21/2015.  FINDINGS: A single intraoperative view of the lumbar spine in the lateral projection is provided. Image demonstrates a probe at the L5-S1 level.  IMPRESSION: As above.   Electronically Signed By: Inge Rise M.D. On: 11/07/2015  14:37   Narrative CLINICAL DATA:  Lumbar fusion L3-L4 and L4-L5 level  EXAM: LUMBAR SPINE - 2-3 VIEW; DG C-ARM 61-120 MIN  COMPARISON:  Intraoperative localization film same day  FINDINGS: Three views of the lumbar spine submitted. Posterior transpedicular metallic screws are noted at L3, L4 and L5 level. The alignment is preserved. Persistent mild anterolisthesis L4 on L5. Postsurgical disc spacer material noted at L3-L4 and L4-L5 level.  IMPRESSION: Posterior transpedicular metallic screws are noted at L3, L4 and L5 level. The alignment is preserved. Persistent mild anterolisthesis L4 on L5. Postsurgical disc spacer material noted at L3-L4 and L4-L5 level.  Please see the operative report.  Fluoroscopy time 43 seconds.   Electronically Signed By: Lahoma Crocker M.D. On: 11/07/2015 16:27   Narrative CLINICAL DATA:  Previous lumbar discectomy and fusion. Low back pain radiating to the hips. Patient does not described pain radiating to the feet.  EXAM: LUMBAR MYELOGRAM  FLUOROSCOPY TIME:  0 minutes 45 seconds. 224.26 micro gray meter squared  PROCEDURE: After thorough discussion of risks and benefits of the procedure including bleeding, infection, injury to nerves, blood vessels, adjacent structures as well as headache and CSF leak, written and oral informed consent was obtained. Consent was obtained by Dr. Nelson Chimes. Time out form was completed.  Patient was positioned prone on the fluoroscopy table. Local anesthesia was provided with 1% lidocaine without epinephrine after prepped and draped in the usual sterile fashion. Puncture was performed at L5-S1 using a 3 1/2 inch 22-gauge spinal needle via right para median approach. Using a single pass through the dura, the needle was placed within the thecal sac, with return of clear CSF. 15 mL of Isovue 200 was injected into the thecal sac, with normal opacification of the nerve roots and cauda equina  consistent with free flow within the subarachnoid space.  I personally  performed the lumbar puncture and administered the intrathecal contrast. I also personally performed acquisition of the myelogram images.  TECHNIQUE: Contiguous axial images were obtained through the Lumbar spine after the intrathecal infusion of infusion. Coronal and sagittal reconstructions were obtained of the axial image sets.  COMPARISON:  Previous post operative radiographs.  Preoperative MRI.  FINDINGS: LUMBAR MYELOGRAM FINDINGS:  Curvature convex to the right with the apex at L2-3 as seen previously. Thoracolumbar curvature convex to the left as seen previously. Left lateral recess narrowing. Left lateral recess narrowing at L1-2 and L2-3 with anterior extradural defects. Wide patency of the central canal in the fusion segment from L3 through L5. Partial compression/endplate collapse at L5 as seen previously. L4 is 7 mm anterior relative to L5. Small anterior extradural defect L5-S1 without apparent neural compression. No motion occurs with flexion and extension.  CT LUMBAR MYELOGRAM FINDINGS:  T11-12: Disc degeneration with vacuum phenomenon. Shallow protrusion of the disc indents the ventral subarachnoid space but does not compress the cord or show foraminal compromise.  T12-L1: Disc degeneration more pronounced on the right with loss of height and vacuum phenomenon. Endplate osteophytes and bulging of the disc. Narrowing of the ventral subarachnoid space. No compressive stenosis.  L1-2: Disc degeneration more pronounced on the right with loss of height and vacuum phenomenon. Endplate osteophytes and bulging of the disc indent the thecal sac but do not appear to cause neural compression. Mild bony foraminal narrowing on the left.  L2-3: Disc degeneration more pronounced on the left with bulging of the disc. Facet and ligamentous hypertrophy. Mild stenosis of the left lateral recess and  intervertebral foramen on the left without visible neural compression.  L3-4: Previous discectomy, posterior decompression and fusion procedure. No evidence of motion at this level. No stenosis.  L4-5: Previous diskectomy, decompression and fusion. No evidence of motion at this level. L4 is 7 mm anterior relative to L5. Since the intraoperative films but present on subsequent postoperative evaluations, there has been both superior and inferior endplate collapse at L5, though this appears healed.  L5-S1: Circumferential bulging of the disc. Facet and ligamentous hypertrophy. Central canal is widely patent. L5 nerve roots appear to exit without compression.  Sacroiliac joints show only minimal osteoarthritis.  IMPRESSION: Thoracolumbar curvature convex to the left and lumbar curvature convex to the right. Right-sided predominant degenerative changes at T12-L1 and L1-2 but without apparent neural compression. Left-sided predominant degenerative change at L2-3 with lateral recess and foraminal narrowing on the left that could possibly be symptomatic.  Diskectomy, decompression and fusion from L3-L5. Sufficient patency of the central canal. No evidence of nonunion or ongoing motion. Endplate collapse is at L5 are apparently old and healed.  Disc bulge and facet osteoarthritis at L5-S1 but without evidence of neural compression.   Electronically Signed By: Nelson Chimes M.D. On: 10/28/2016 14:58   Narrative CLINICAL DATA:  Right hip pain  EXAM: DG HIP (WITH OR WITHOUT PELVIS) 2-3V RIGHT  COMPARISON:  None.  FINDINGS: Three views of the right hip submitted. No acute fracture or subluxation. Pelvic phleboliths are noted. Mild degenerative changes pubic symphysis.  IMPRESSION: Negative.   Electronically Signed By: Lahoma Crocker M.D. On: 08/06/2016 11:25   Narrative CLINICAL DATA:  Left hip pain with reduced internal rotation  EXAM: LEFT HIP (WITH PELVIS) 2-3  VIEWS  COMPARISON:  None.  FINDINGS: The study is limited due to scatter related to the patient's body habitus. The bony pelvis is grossly intact. There is mild narrowing  of the hip joint space on the standing AP pelvis view. This is less conspicuous on the AP left hip view and the frog-leg lateral views. There are spurs from the articular margins of the femoral head. The femoral head and acetabulum appears smoothly rounded. The overlying soft tissues are unremarkable.  IMPRESSION: There is mild osteoarthritic change of the left hip joint. There is no acute bony abnormality.   Electronically Signed By: David  [place] On: 11/28/2014 12:15   Narrative CLINICAL DATA:  swelling and pain  EXAM: LEFT ELBOW - COMPLETE 3+ VIEW  COMPARISON:  None.  FINDINGS: No acute fracture or dislocation. Mild degenerative changes of the ulnohumeral joint. Mild enthesopathic changes of the radial head. No area of erosion or osseous destruction. No unexpected radiopaque foreign body. Marked soft tissue edema overlying the olecranon process.  IMPRESSION: No acute osseous abnormality. Marked soft tissue edema overlying the olecranon process likely reflects olecranon bursitis.   Electronically Signed By: Valentino Saxon M.D. On: 09/01/2021 13:36   Complexity Note: Imaging results reviewed.                         ROS  Cardiovascular: Pacemaker or defibrillator Pulmonary or Respiratory: Coughing up mucus (Bronchitis) Neurological: Curved spine Psychological-Psychiatric: No reported psychological or psychiatric signs or symptoms such as difficulty sleeping, anxiety, depression, delusions or hallucinations (schizophrenial), mood swings (bipolar disorders) or suicidal ideations or attempts Gastrointestinal: No reported gastrointestinal signs or symptoms such as vomiting or evacuating blood, reflux, heartburn, alternating episodes of diarrhea and constipation, inflamed or scarred liver, or  pancreas or irrregular and/or infrequent bowel movements Genitourinary: No reported renal or genitourinary signs or symptoms such as difficulty voiding or producing urine, peeing blood, non-functioning kidney, kidney stones, difficulty emptying the bladder, difficulty controlling the flow of urine, or chronic kidney disease Hematological: No reported hematological signs or symptoms such as prolonged bleeding, low or poor functioning platelets, bruising or bleeding easily, hereditary bleeding problems, low energy levels due to low hemoglobin or being anemic Endocrine: No reported endocrine signs or symptoms such as high or low blood sugar, rapid heart rate due to high thyroid levels, obesity or weight gain due to slow thyroid or thyroid disease Rheumatologic: No reported rheumatological signs and symptoms such as fatigue, joint pain, tenderness, swelling, redness, heat, stiffness, decreased range of motion, with or without associated rash Musculoskeletal: Negative for myasthenia gravis, muscular dystrophy, multiple sclerosis or malignant hyperthermia Work History: Retired  Allergies  Ms. Hon has No Known Allergies.  Laboratory Chemistry Profile   Renal Lab Results  Component Value Date   BUN 33 (H) 07/01/2022   CREATININE 1.07 07/01/2022   BCR 25 (H) 11/10/2019   GFR 51.30 (L) 07/01/2022   GFRAA 53 (L) 06/10/2020   GFRNONAA 50 (L) 12/07/2021   PROTEINUR NEGATIVE 06/10/2020     Electrolytes Lab Results  Component Value Date   NA 139 07/01/2022   K 4.2 07/01/2022   CL 101 07/01/2022   CALCIUM 9.6 07/01/2022   MG 2.2 12/03/2021   PHOS 3.8 07/01/2022     Hepatic Lab Results  Component Value Date   AST 13 07/01/2022   ALT 11 07/01/2022   ALBUMIN 4.2 07/01/2022   ALBUMIN 4.2 07/01/2022   ALKPHOS 82 07/01/2022     ID Lab Results  Component Value Date   SARSCOV2NAA NEGATIVE 06/10/2020   STAPHAUREUS NEGATIVE 12/06/2021   MRSAPCR NEGATIVE 12/06/2021   HCVAB NEGATIVE  06/12/2016     Bone  No results found for: "VD25OH", "VD125OH2TOT", "XV4008QP6", "PP5093OI7", "25OHVITD1", "25OHVITD2", "25OHVITD3", "TESTOFREE", "TESTOSTERONE"   Endocrine Lab Results  Component Value Date   GLUCOSE 84 07/01/2022   GLUCOSEU NEGATIVE 06/10/2020   HGBA1C 6.6 (H) 07/01/2022   TSH 1.64 07/01/2022   FREET4 0.80 01/03/2019   UCORFRPERDAY 42.5 07/28/2011     Neuropathy Lab Results  Component Value Date   VITAMINB12 1,091 (H) 02/20/2017   HGBA1C 6.6 (H) 07/01/2022     CNS No results found for: "COLORCSF", "APPEARCSF", "RBCCOUNTCSF", "WBCCSF", "POLYSCSF", "LYMPHSCSF", "EOSCSF", "PROTEINCSF", "GLUCCSF", "JCVIRUS", "CSFOLI", "IGGCSF", "LABACHR", "ACETBL"   Inflammation (CRP: Acute  ESR: Chronic) Lab Results  Component Value Date   CRP 0.7 11/10/2019   ESRSEDRATE 14 11/10/2019     Rheumatology No results found for: "RF", "ANA", "LABURIC", "URICUR", "LYMEIGGIGMAB", "LYMEABIGMQN", "HLAB27"   Coagulation Lab Results  Component Value Date   INR 1.0 12/03/2021   LABPROT 12.8 12/03/2021   APTT 28 06/11/2020   PLT 304.0 07/01/2022     Cardiovascular Lab Results  Component Value Date   BNP 426.7 (H) 12/03/2021   HGB 14.6 07/01/2022   HCT 44.6 07/01/2022     Screening Lab Results  Component Value Date   SARSCOV2NAA NEGATIVE 06/10/2020   STAPHAUREUS NEGATIVE 12/06/2021   MRSAPCR NEGATIVE 12/06/2021   HCVAB NEGATIVE 06/12/2016     Cancer No results found for: "CEA", "CA125", "LABCA2"   Allergens No results found for: "ALMOND", "APPLE", "ASPARAGUS", "AVOCADO", "BANANA", "BARLEY", "BASIL", "BAYLEAF", "GREENBEAN", "LIMABEAN", "WHITEBEAN", "BEEFIGE", "REDBEET", "BLUEBERRY", "BROCCOLI", "CABBAGE", "MELON", "CARROT", "CASEIN", "CASHEWNUT", "CAULIFLOWER", "CELERY"     Note: Lab results reviewed.  [place]  Drug: Ms. Schobert  reports no history of drug use. Alcohol:  reports no history of alcohol use. Tobacco:  reports that she has never smoked. She has never been  exposed to tobacco smoke. She has never used smokeless tobacco. Medical:  has a past medical history of Arthritis, Confusion, Diabetes mellitus without complication ([place]), Hepatitis, Hypertension, Lumbar stenosis, Memory loss, Nocturia, Numbness and tingling, and Wears glasses. Family: family history includes Cancer in her mother and another family member; Heart disease in her father; Hypertension in an other family member.  Past Surgical History:  Procedure Laterality Date   BACK SURGERY     CESAREAN SECTION  1978   COLONOSCOPY     HEMORRHOID SURGERY  2015   JOINT REPLACEMENT     01-08-18 Dr. Kathrynn Speed   PACEMAKER LEADLESS INSERTION N/A 12/06/2021   Procedure: PACEMAKER LEADLESS INSERTION;  Surgeon: Isaias Cowman, MD;  Location: [facility];  Service: Cardiovascular;  Laterality: N/A;   TOTAL HIP ARTHROPLASTY Left 01/08/2018   Procedure: LEFT TOTAL HIP ARTHROPLASTY ANTERIOR APPROACH;  Surgeon: Mcarthur Rossetti, MD;  Location: [facility];  Service: Orthopedics;  Laterality: Left;   TRANSFORAMINAL LUMBAR INTERBODY FUSION (TLIF) WITH PEDICLE SCREW FIXATION 2 LEVEL N/A 11/07/2015   Procedure: Lumbar three-four Lumbar four-five  transforaminal lumbar interbody fusion with interbody prosthesis posterior lateral arthrodesis and posterior segmental instrumentation;  Surgeon: Newman Pies, MD;  Location: [facility];  Service: Neurosurgery;  Laterality: N/A;   Active Ambulatory Problems    Diagnosis Date Noted   Essential hypertension, benign 08/06/2010   Failed back surgical syndrome 07/15/2011   IBS (irritable bowel syndrome) 12/27/2012   MCI (mild cognitive impairment) 12/26/2013   Type 2 diabetes, controlled, with neuropathy ([place]) 01/20/2014   Osteoarthritis, hip, bilateral 11/28/2014   Peripheral neuropathy 11/28/2014   Preventative health care 01/10/2015   Chronic back pain 03/29/2015  Lumbar spinal stenosis due to adjacent segment disease after fusion procedure  11/07/2015   Advance directive discussed with patient 01/15/2016   MDD (major depressive disorder), recurrent, in partial remission ([place]) 05/07/2016   Chronic pain syndrome 03/13/2017   Facet arthropathy, lumbar 03/13/2017   Status post total replacement of left hip 01/08/2018   Urge incontinence 10/07/2018   Venous insufficiency 10/07/2018   Chronic narcotic dependence ([place]) 07/04/2019   Chronic diastolic CHF (congestive heart failure) ([place]) 06/10/2020   Stage 3b chronic kidney disease ([place]) 06/10/2020   Type II diabetes mellitus with renal manifestations ([place]) 06/10/2020   Abnormal MRI    History of stroke 07/04/2020   Candidal intertrigo 03/29/2021   Complete heart block (HCC) 12/03/2021   HLD (hyperlipidemia) 12/03/2021   Depression 12/03/2021   Memory loss 12/03/2021   Bradycardia 12/03/2021   Elevated troponin 12/03/2021   Acute cough 08/29/2022   Asthmatic bronchitis 09/04/2022   Chronic radicular lumbar pain 09/25/2022   History of lumbar fusion (L3-L5) 09/25/2022   Foraminal stenosis of thoracic region 09/25/2022   Chronic SI joint pain 09/25/2022   Resolved Ambulatory Problems    Diagnosis Date Noted   Dermatophytosis of nail 08/06/2010   Hirsutism 08/06/2010   Sciatica neuralgia 05/19/2011   Adrenal adenoma 07/15/2011   Chest pain 04/01/2012   Nocturia 04/01/2012   Muscle pain 04/01/2012   Routine general medical examination at a health care facility 04/13/2012   Fracture of humerus, proximal, left, closed 11/16/2012   Lipoma 02/21/2013   Welcome to Medicare preventive visit 09/06/2013   Bleeding hemorrhoid 11/07/2013   Tingling 12/26/2013   Episodic mood disorder ([place]) 01/20/2014   Morbid obesity ([place]) 04/05/2014   Eye lump 09/17/2015   Preop cardiovascular exam 11/05/2015   Abnormal EKG 11/05/2015   Memory change 12/04/2015   Edema 12/04/2015   Right hand weakness 12/13/2015   Tick bite of groin 02/20/2016   Right hip pain 08/06/2016   Rectal bleeding  09/02/2016   Contusion of left lower leg 09/18/2017   Leg ulcer ([place]) 09/29/2017   Sore throat 09/29/2017   Other intervertebral disc degeneration, lumbar region 06/01/2015   Left hip pain 12/09/2017   Unilateral primary osteoarthritis, left hip 01/08/2018   Mycotic toenails 10/07/2018   Osteoarthritis of right knee 11/16/2018   Shoulder tendonitis, left 07/25/2019   Cellulitis of right index finger 08/22/2019   Osteomyelitis of finger of right hand ([place]) 11/10/2019   Chronic renal insufficiency, stage 1 11/11/2019   Facial laceration 02/28/2020   Contusion, back 02/28/2020   Dizziness 02/28/2020   Cellulitis of left foot 05/10/2020   Stroke ([place]) 06/10/2020   Bursitis of left elbow 09/18/2021   Actinic keratosis 02/19/2022   Past Medical History:  Diagnosis Date   Arthritis    Confusion    Diabetes mellitus without complication (HCC)    Hepatitis    Hypertension    Lumbar stenosis    Numbness and tingling    Wears glasses    Constitutional Exam  General appearance: Well nourished, well developed, and well hydrated. In no apparent acute distress Vitals:   09/25/22 1023  BP: (!) 138/57  Pulse: (!) 102  Resp: 17  Temp: (!) 97.2 F (36.2 C)  TempSrc: Temporal  SpO2: 96%  Weight: 190 lb (86.2 kg)  Height: 5' (1.524 m)   BMI Assessment: Estimated body mass index is 37.11 kg/m as calculated from the following:   Height as of this encounter: 5' (1.524 m).   Weight as of  this encounter: 190 lb (86.2 kg).  BMI interpretation table: BMI level Category Range association with higher incidence of chronic pain  <18 kg/m2 Underweight   18.5-24.9 kg/m2 Ideal body weight   25-29.9 kg/m2 Overweight Increased incidence by 20%  30-34.9 kg/m2 Obese (Class I) Increased incidence by 68%  35-39.9 kg/m2 Severe obesity (Class II) Increased incidence by 136%  >40 kg/m2 Extreme obesity (Class III) Increased incidence by 254%   Patient's current BMI Ideal Body weight  Body mass index  is 37.11 kg/m. Ideal body weight: 45.5 kg (100 lb 4.9 oz) Adjusted ideal body weight: 61.8 kg (136 lb 3 oz)   BMI Readings from Last 4 Encounters:  09/25/22 37.11 kg/m  09/04/22 37.89 kg/m  08/29/22 37.11 kg/m  07/01/22 36.91 kg/m   Wt Readings from Last 4 Encounters:  09/25/22 190 lb (86.2 kg)  09/04/22 194 lb (88 kg)  08/29/22 190 lb (86.2 kg)  07/01/22 189 lb (85.7 kg)    Psych/Mental status: Alert, oriented x 3 (person, place, & time)       Eyes: PERLA Respiratory: No evidence of acute respiratory distress  Thoracic Spine Area Exam  Skin & Axial Inspection: Significant thoracic kyphosis Alignment: Symmetrical Functional ROM: Pain restricted ROM Stability: No instability detected Muscle Tone/Strength: Functionally intact. No obvious neuro-muscular anomalies detected. Sensory (Neurological): Neurogenic pain pattern Muscle strength & Tone: No palpable anomalies  Lumbar Spine Area Exam  Skin & Axial Inspection: Well healed scar from previous spine surgery detected Alignment: Symmetrical Functional ROM: Decreased ROM       Stability: No instability detected Muscle Tone/Strength: Functionally intact. No obvious neuro-muscular anomalies detected. Sensory (Neurological): Dermatomal pain pattern Palpation: Complains of area being tender to palpation       Provocative Tests: Hyperextension/rotation test: Unable to perform due to fusion restriction.   Gait & Posture Assessment  Ambulation: Patient ambulates using a walker Gait: Antalgic gait (limping) Posture: Difficulty standing up straight, due to pain  Lower Extremity Exam    Side: Right lower extremity  Side: Left lower extremity  Stability: No instability observed          Stability: No instability observed          Skin & Extremity Inspection: Skin color, temperature, and hair growth are WNL. No peripheral edema or cyanosis. No masses, redness, swelling, asymmetry, or associated skin lesions. No contractures.  Skin  & Extremity Inspection: Skin color, temperature, and hair growth are WNL. No peripheral edema or cyanosis. No masses, redness, swelling, asymmetry, or associated skin lesions. No contractures.  Functional ROM: Pain restricted ROM for all joints of the lower extremity          Functional ROM: Pain restricted ROM for all joints of the lower extremity          Muscle Tone/Strength: Functionally intact. No obvious neuro-muscular anomalies detected.  Muscle Tone/Strength: Functionally intact. No obvious neuro-muscular anomalies detected.  Sensory (Neurological): Neurogenic pain pattern        Sensory (Neurological): Neurogenic pain pattern        DTR: Patellar: deferred today Achilles: deferred today Plantar: deferred today  DTR: Patellar: deferred today Achilles: deferred today Plantar: deferred today  Palpation: No palpable anomalies  Palpation: No palpable anomalies  4 out of 5 strength bilateral lower extremity: Plantar flexion, dorsiflexion, knee flexion, knee extension.   Assessment  Primary Diagnosis & Pertinent Problem List: The primary encounter diagnosis was Spinal stenosis, lumbar region, with neurogenic claudication. Diagnoses of Chronic radicular lumbar pain, History of  lumbar fusion (L3-L5), Chronic SI joint pain, Foraminal stenosis of thoracic region, Failed back surgical syndrome, Lumbar spinal stenosis due to adjacent segment disease after fusion procedure, and Chronic pain syndrome were also pertinent to this visit.  Visit Diagnosis (New problems to examiner): 1. Spinal stenosis, lumbar region, with neurogenic claudication   2. Chronic radicular lumbar pain   3. History of lumbar fusion (L3-L5)   4. Chronic SI joint pain   5. Foraminal stenosis of thoracic region   6. Failed back surgical syndrome   7. Lumbar spinal stenosis due to adjacent segment disease after fusion procedure   8. Chronic pain syndrome    Plan of Care (Initial workup plan)  Discussed treatment options  for her condition.  Patient likely has chronic lumbar radicular pain secondary to neuroforaminal stenosis, lumbar disc herniations, and central canal stenosis.  She also has significant lumbar degenerative disc disease.  Her SI joint could also be a pain generator for her as she does have pain overlying her buttock.  At L2-L3 there is chronic disc space loss with a disc osteophyte complex as well as moderate facet hypertrophy on the left.  She has left L3 lateral recess stenosis which is severe on the left and mild on the right at L2.  She also has multilevel thoracic disc herniations with mild thoracic spinal stenosis at T8-T9, T10 and T11 with possible mild spinal cord mass effect.  She has severe left T8, bilateral T9 and left T10 neuroforaminal stenosis that could also be contributing to her symptoms.  We discussed starting off with a lumbar epidural steroid injection above her fusion hardware.  Future considerations could include SI joint intervention versus spinal cord stimulator trial.  I will obtain an x-ray of her SI joint.  Treatment plan will be further discussed at next visit after her diagnostic lumbar epidural steroid injection and evaluation of her lumbar spine under live fluoroscopy.  I encouraged her to continue medication management with her primary care provider.  Procedure Orders         Lumbar Epidural Injection     Orders Placed This Encounter  Procedures   Lumbar Epidural Injection    Standing Status:   Future    Standing Expiration Date:   12/25/2022    Scheduling Instructions:     Procedure: Interlaminar Lumbar Epidural Steroid injection (LESI)            Laterality: L2-3     Sedation: without     Timeframe: ASAA    Order Specific Question:   Where will this procedure be performed?    Answer:   [facility]   DG Si Joints    Standing Status:   Future    Number of Occurrences:   1    Standing Expiration Date:   09/26/2023    Order Specific Question:   Reason for  Exam (SYMPTOM  OR DIAGNOSIS REQUIRED)    Answer:   b/l SI-J pain, hx of L3-5 fusion    Order Specific Question:   Preferred imaging location?    Answer:   [facility]     Provider-requested follow-up: Return in about 18 days (around 10/13/2022) for L2/3 ESI, in clinic NS.  Future Appointments  Date Time Provider [place]  10/01/2022 11:30 AM Venia Carbon, MD [facility]  10/15/2022 11:00 AM Gillis Santa, MD [facility] None    Duration of encounter: 21mnutes.  Total time on encounter, as per AMA guidelines included both the face-to-face and non-face-to-face  time personally spent by the physician and/or other qualified health care professional(s) on the day of the encounter (includes time in activities that require the physician or other qualified health care professional and does not include time in activities normally performed by clinical staff). Physician's time may include the following activities when performed: Preparing to see the patient (e.g., pre-charting review of records, searching for previously ordered imaging, lab work, and nerve conduction tests) Review of prior analgesic pharmacotherapies. Reviewing PMP Interpreting ordered tests (e.g., lab work, imaging, nerve conduction tests) Performing post-procedure evaluations, including interpretation of diagnostic procedures Obtaining and/or reviewing separately obtained history Performing a medically appropriate examination and/or evaluation Counseling and educating the patient/family/caregiver Ordering medications, tests, or procedures Referring and communicating with other health care professionals (when not separately reported) Documenting clinical information in the electronic or other health record Independently interpreting results (not separately reported) and communicating results to the patient/ family/caregiver Care coordination (not separately reported)  Note by: Gillis Santa, MD Date: 09/25/2022;  Time: 1:57 PM

## 2022-10-01 ENCOUNTER — Encounter: Payer: Self-pay | Admitting: Internal Medicine

## 2022-10-01 ENCOUNTER — Ambulatory Visit (INDEPENDENT_AMBULATORY_CARE_PROVIDER_SITE_OTHER): Payer: 59 | Admitting: Internal Medicine

## 2022-10-01 ENCOUNTER — Telehealth: Payer: Self-pay | Admitting: Internal Medicine

## 2022-10-01 VITALS — BP 118/78 | HR 96 | Temp 97.2°F | Ht 60.0 in | Wt 195.0 lb

## 2022-10-01 DIAGNOSIS — M549 Dorsalgia, unspecified: Secondary | ICD-10-CM | POA: Diagnosis not present

## 2022-10-01 DIAGNOSIS — E1140 Type 2 diabetes mellitus with diabetic neuropathy, unspecified: Secondary | ICD-10-CM | POA: Diagnosis not present

## 2022-10-01 DIAGNOSIS — G8929 Other chronic pain: Secondary | ICD-10-CM

## 2022-10-01 DIAGNOSIS — I5032 Chronic diastolic (congestive) heart failure: Secondary | ICD-10-CM

## 2022-10-01 DIAGNOSIS — F1120 Opioid dependence, uncomplicated: Secondary | ICD-10-CM

## 2022-10-01 DIAGNOSIS — I10 Essential (primary) hypertension: Secondary | ICD-10-CM

## 2022-10-01 LAB — POCT GLYCOSYLATED HEMOGLOBIN (HGB A1C): Hemoglobin A1C: 6.1 % — AB (ref 4.0–5.6)

## 2022-10-01 MED ORDER — TRAMADOL HCL 50 MG PO TABS: 50.0000 mg | ORAL_TABLET | Freq: Three times a day (TID) | ORAL | 0 refills | Status: DC | PRN

## 2022-10-01 NOTE — Assessment & Plan Note (Signed)
Doing better now Feels the tramadol 100 tid is helping more than hydrocodone Able to maintain function--albeit with pain Has the gabapentin for night time (diabetic neuropathy)

## 2022-10-01 NOTE — Telephone Encounter (Signed)
Patient called and stated will Dr. Silvio Pate write a letter for patient stated she has a hard time cooking, cleaning, bathing. Call back number [phone].

## 2022-10-01 NOTE — Assessment & Plan Note (Signed)
PDMP reviewed No concerns 

## 2022-10-01 NOTE — Assessment & Plan Note (Addendum)
Seems to be well controlled without meds Will check A1c  Lab Results  Component Value Date   HGBA1C 6.1 (A) 10/01/2022   Still excellent control  No changes needed

## 2022-10-01 NOTE — Progress Notes (Signed)
Subjective:    Patient ID: [name], female    DOB: [birth year], 77 y.o.   MRN: [id number]  HPI Here for follow up of chronic pain and narcotic dependence  Doing "really good" Taking tramadol regularly--finds it keeps her from getting out of control with the pain Uses 2 tabs three times a day Uses tylenol some of the time  Did see Dr Dot Been specialist Reviewed his note and plans Is planning some injections Told her this is worth a try  Occ sharp pain on left side Walks with walker Can go to grocery store (but son usually does it) Can do light housework--but it does cause her pain  Current Outpatient Medications on File Prior to Visit  Medication Sig Dispense Refill   Accu-Chek Softclix Lancets lancets Use as instructed 100 each 3   amLODipine (NORVASC) 5 MG tablet Take 5 mg by mouth daily.     ARIPiprazole (ABILIFY) 2 MG tablet TAKE 1 TABLET BY MOUTH DAILY AT  BEDTIME 90 tablet 3   aspirin EC 81 MG tablet Take 1 tablet by mouth daily.     atorvastatin (LIPITOR) 80 MG tablet TAKE 1 TABLET BY MOUTH DAILY 100 tablet 3   donepezil (ARICEPT) 10 MG tablet Take 10 mg by mouth every morning.     DULoxetine (CYMBALTA) 60 MG capsule TAKE 1 CAPSULE BY MOUTH DAILY 90 capsule 3   gabapentin (NEURONTIN) 300 MG capsule Take 3 capsules (900 mg total) by mouth at bedtime. 300 capsule 3   lisinopril-hydrochlorothiazide (ZESTORETIC) 20-12.5 MG tablet Take 1 tablet by mouth daily. 90 tablet 3   Multiple Vitamins-Minerals (MULTIVITAMIN WITH MINERALS) tablet Take 1 tablet by mouth daily.      ONETOUCH VERIO test strip USE TO CHECK BLOOD SUGAR ONCE A DAY DX CODE E11.21 100 strip 12   traMADol (ULTRAM) 50 MG tablet Take 1-2 tablets (50-100 mg total) by mouth every 8 (eight) hours as needed. 180 tablet 0   No current facility-administered medications on file prior to visit.    No Known Allergies  Past Medical History:  Diagnosis Date   Arthritis    Confusion    Diabetes mellitus  without complication (HCC)    type 2   Hepatitis    pt. denies   Hypertension    Lumbar stenosis    Memory loss    Nocturia    Numbness and tingling    legs and feet   Wears glasses     Past Surgical History:  Procedure Laterality Date   BACK SURGERY     CESAREAN SECTION  1978   COLONOSCOPY     HEMORRHOID SURGERY  2015   JOINT REPLACEMENT     01-08-18 Dr. Kathrynn Speed   PACEMAKER LEADLESS INSERTION N/A 12/06/2021   Procedure: PACEMAKER LEADLESS INSERTION;  Surgeon: Isaias Cowman, MD;  Location: [facility];  Service: Cardiovascular;  Laterality: N/A;   TOTAL HIP ARTHROPLASTY Left 01/08/2018   Procedure: LEFT TOTAL HIP ARTHROPLASTY ANTERIOR APPROACH;  Surgeon: Mcarthur Rossetti, MD;  Location: [facility];  Service: Orthopedics;  Laterality: Left;   TRANSFORAMINAL LUMBAR INTERBODY FUSION (TLIF) WITH PEDICLE SCREW FIXATION 2 LEVEL N/A 11/07/2015   Procedure: Lumbar three-four Lumbar four-five  transforaminal lumbar interbody fusion with interbody prosthesis posterior lateral arthrodesis and posterior segmental instrumentation;  Surgeon: Newman Pies, MD;  Location: [facility];  Service: Neurosurgery;  Laterality: N/A;    Family History  Problem Relation Age of Onset   Hypertension Other  Cancer Other        ovarian   Cancer Mother    Heart disease Father     Social History   Socioeconomic History   Marital status: Divorced    Spouse name: Not on file   Number of children: 4   Years of education: Not on file   Highest education level: Associate degree: occupational, Hotel manager, or vocational program  Occupational History   Not on file  Tobacco Use   Smoking status: Never    Passive exposure: Never   Smokeless tobacco: Never  Vaping Use   Vaping Use: Never used  Substance and Sexual Activity   Alcohol use: No    Comment: rarely   Drug use: No   Sexual activity: Not Currently  Other Topics Concern   Not on file  Social History Narrative    Has a living will.   Son is health care POA   Would accept resuscitation.     Would accept life support    No tube feeds if cognitively unaware   Social Determinants of Health   Financial Resource Strain: Low Risk  (11/20/2017)   Overall Financial Resource Strain (CARDIA)    Difficulty of Paying Living Expenses: Not very hard  Food Insecurity: No Food Insecurity (11/20/2017)   Hunger Vital Sign    Worried About Running Out of Food in the Last Year: Never true    Ran Out of Food in the Last Year: Never true  Transportation Needs: Unmet Transportation Needs (11/20/2017)   PRAPARE - Hydrologist (Medical): Yes    Lack of Transportation (Non-Medical): Yes  Physical Activity: Inactive (11/20/2017)   Exercise Vital Sign    Days of Exercise per Week: 0 days    Minutes of Exercise per Session: 0 min  Stress: Stress Concern Present (11/20/2017)   [place]    Feeling of Stress : To some extent  Social Connections: Unknown (11/20/2017)   Social Connection and Isolation Panel [NHANES]    Frequency of Communication with Friends and Family: Not on file    Frequency of Social Gatherings with Friends and Family: Not on file    Attends Religious Services: Never    Marine scientist or Organizations: No    Attends Archivist Meetings: Never    Marital Status: Divorced  Human resources officer Violence: Not At Risk (11/20/2017)   Humiliation, Afraid, Rape, and Kick questionnaire    Fear of Current or Ex-Partner: No    Emotionally Abused: No    Physically Abused: No    Sexually Abused: No   Review of Systems Sleeping fair Up to void--occasional nocturia Uses gabapentin at bedtime---will occ take extra if having trouble Not checking sugars regularly--last around 100     Objective:   Physical Exam Constitutional:      Appearance: Normal appearance.  Neurological:     Mental Status: She is  alert.  Psychiatric:        Mood and Affect: Mood normal.        Behavior: Behavior normal.            Assessment & Plan:

## 2022-10-02 NOTE — Telephone Encounter (Signed)
Pt states she needs help with ADLs like cooking, cleaning, bathing. Eating tv dinners vs making food. Struggles to clean and taking a bath. States she does it but it is very difficult.

## 2022-10-07 NOTE — Telephone Encounter (Signed)
I called pt back to see who we needed to address this to. She stated To Whom It May Concern. I asked her if she has reached out to her insurance company to find out if and what they cover. She said they directed her to contact social services. I advised her that means they do not cover things like that. So, I advised her I would forward this to Dr Silvio Pate to start a referral with [facility] for social work to see if they can help her. I advised her that the help she is needing may have to come out of pocket.

## 2022-10-08 ENCOUNTER — Encounter: Payer: Self-pay | Admitting: Internal Medicine

## 2022-10-08 NOTE — Telephone Encounter (Signed)
Spoke to pt to let her know to be looking out for their call.

## 2022-10-08 NOTE — Telephone Encounter (Signed)
Please let her know that I have referred her to our care management team---nurse, social worker, pharmacist. They can determine what needs she has and assist in finding out what resources are available. As you noted, insurances generally don't pay for personal care needs unless you have a long term care insurance.

## 2022-10-09 ENCOUNTER — Telehealth: Payer: Self-pay

## 2022-10-09 ENCOUNTER — Telehealth: Payer: Self-pay | Admitting: *Deleted

## 2022-10-09 NOTE — Progress Notes (Signed)
  Chronic Care Management   Note  10/09/2022 Name: [name] MRN: [id number] DOB: 1946-01-04  [name] is a 77 y.o. year old female who is a primary care patient of Venia Carbon, MD. I reached out to [name] by phone today in response to a referral sent by Ms. [place] PCP.  The first contact attempt was unsuccessful.   Follow up plan: Additional outreach attempts will be made.  Noreene Larsson, [place], [place] [address] Direct Dial: [phone] [email or web address]

## 2022-10-09 NOTE — Progress Notes (Signed)
  Care Coordination   Note   10/09/2022 Name: LAURNA SHETLEY MRN: [id number] DOB: 01-19-1946  FANNYE MYER is a 77 y.o. year old female who sees Venia Carbon, MD for primary care. I reached out to [name] by phone today to offer care coordination services.  Ms. [place] was given information about Care Coordination services today including:   The Care Coordination services include support from the care team which includes your Nurse Coordinator, Clinical Social Worker, or Pharmacist.  The Care Coordination team is here to help remove barriers to the health concerns and goals most important to you. Care Coordination services are voluntary, and the patient may decline or stop services at any time by request to their care team member.   Care Coordination Consent Status: Patient agreed to services and verbal consent obtained.   Follow up plan:  Telephone appointment with care coordination team member scheduled for:  10/17/2022  Encounter Outcome:  Pt. Scheduled  Julian Hy, [place] Direct Dial: [phone]

## 2022-10-09 NOTE — Progress Notes (Signed)
  Chronic Care Management   Note  10/09/2022 Name: [name] MRN: [id number] DOB: 05-17-1946  [name] is a 77 y.o. year old female who is a primary care patient of Venia Carbon, MD. I reached out to [name] by phone today in response to a referral sent by Ms. [place] PCP.  Ms. Berkland was given information about Chronic Care Management services today including:  CCM service includes personalized support from designated clinical staff supervised by the physician, including individualized plan of care and coordination with other care providers 24/7 contact phone numbers for assistance for urgent and routine care needs. Service will only be billed when office clinical staff spend 20 minutes or more in a month to coordinate care. Only one practitioner may furnish and bill the service in a calendar month. The patient may stop CCM services at amy time (effective at the end of the month) by phone call to the office staff. The patient will be responsible for cost sharing (co-pay) or up to 20% of the service fee (after annual deductible is met)  Ms. Geraldyne [facility]aclough [facility]  declinedto scheduling an appointment with the CCM RN Case Manager   Follow up plan: Patient did not agree to scheduling an appointment with the RN Case Manager. The ordering provider has been notified.   Noreene Larsson, [place], [place] [address] Direct Dial: [phone] [email or web address]

## 2022-10-09 NOTE — Progress Notes (Signed)
   Care Guide Note  10/09/2022 Name: [name] MRN: [id number] DOB: 12/18/1945  Referred by: Venia Carbon, MD Reason for referral : Chronic Care Management (Outreach to schedule referral /)   [name] is a 78 y.o. year old female who is a primary care patient of Venia Carbon, MD. [name] was referred to the pharmacist for assistance related to DM.    Successful contact was made with the patient to discuss pharmacy services. Patient declines engagement at this time. Contact information was provided to the patient should they wish to reach out for assistance at a later time.  [name], [place], [place] [address] Direct Dial: [phone] [name]

## 2022-10-10 ENCOUNTER — Encounter: Payer: Self-pay | Admitting: Internal Medicine

## 2022-10-10 DIAGNOSIS — R040 Epistaxis: Secondary | ICD-10-CM

## 2022-10-13 ENCOUNTER — Ambulatory Visit: Payer: Medicaid Other | Admitting: Student in an Organized Health Care Education/Training Program

## 2022-10-15 ENCOUNTER — Ambulatory Visit
Admission: RE | Admit: 2022-10-15 | Discharge: 2022-10-15 | Disposition: A | Discharge status: Home / Self Care | Payer: 59 | Source: Ambulatory Visit | Attending: Student in an Organized Health Care Education/Training Program | Admitting: Student in an Organized Health Care Education/Training Program

## 2022-10-15 ENCOUNTER — Ambulatory Visit
Payer: 59 | Attending: Student in an Organized Health Care Education/Training Program | Admitting: Student in an Organized Health Care Education/Training Program

## 2022-10-15 ENCOUNTER — Encounter: Payer: Self-pay | Admitting: Student in an Organized Health Care Education/Training Program

## 2022-10-15 VITALS — BP 148/57 | HR 103 | Temp 96.9°F | Resp 24 | Ht 61.0 in | Wt 190.0 lb

## 2022-10-15 DIAGNOSIS — G8929 Other chronic pain: Secondary | ICD-10-CM | POA: Diagnosis not present

## 2022-10-15 DIAGNOSIS — Z981 Arthrodesis status: Secondary | ICD-10-CM | POA: Insufficient documentation

## 2022-10-15 DIAGNOSIS — M5416 Radiculopathy, lumbar region: Secondary | ICD-10-CM | POA: Insufficient documentation

## 2022-10-15 DIAGNOSIS — M48062 Spinal stenosis, lumbar region with neurogenic claudication: Secondary | ICD-10-CM | POA: Insufficient documentation

## 2022-10-15 MED ORDER — SODIUM CHLORIDE 0.9% FLUSH
2.0000 mL | Freq: Once | INTRAVENOUS | Status: AC
  Administered 2022-10-15: 2 mL

## 2022-10-15 MED ORDER — DEXAMETHASONE SODIUM PHOSPHATE 10 MG/ML IJ SOLN
10.0000 mg | Freq: Once | INTRAMUSCULAR | Status: AC
  Administered 2022-10-15: 10 mg
  Filled 2022-10-15: qty 1

## 2022-10-15 MED ORDER — LIDOCAINE HCL 2 % IJ SOLN
20.0000 mL | Freq: Once | INTRAMUSCULAR | Status: AC
  Administered 2022-10-15: 100 mg
  Filled 2022-10-15: qty 20

## 2022-10-15 MED ORDER — SODIUM CHLORIDE (PF) 0.9 % IJ SOLN
INTRAMUSCULAR | Status: AC
  Filled 2022-10-15: qty 10

## 2022-10-15 MED ORDER — ROPIVACAINE HCL 2 MG/ML IJ SOLN
2.0000 mL | Freq: Once | INTRAMUSCULAR | Status: AC
  Administered 2022-10-15: 2 mL via EPIDURAL
  Filled 2022-10-15: qty 20

## 2022-10-15 MED ORDER — IOHEXOL 180 MG/ML  SOLN
10.0000 mL | Freq: Once | INTRAMUSCULAR | Status: AC
  Administered 2022-10-15: 10 mL via EPIDURAL
  Filled 2022-10-15: qty 20

## 2022-10-15 NOTE — Patient Instructions (Signed)

## 2022-10-15 NOTE — Progress Notes (Signed)
Safety precautions to be maintained throughout the outpatient stay will include: orient to surroundings, keep bed in low position, maintain call bell within reach at all times, provide assistance with transfer out of bed and ambulation.  

## 2022-10-15 NOTE — Progress Notes (Signed)
PROVIDER NOTE: Interpretation of information contained herein should be left to medically-trained personnel. Specific patient instructions are provided elsewhere under "Patient Instructions" section of medical record. This document was created in part using STT-dictation technology, any transcriptional errors that may result from this process are unintentional.  Patient: [name] Type: Established DOB: Apr 27, 1946 MRN: [id number] PCP: Venia Carbon, MD  Service: Procedure DOS: 10/15/2022 Setting: Ambulatory Location: Ambulatory outpatient facility Delivery: Face-to-face Provider: Gillis Santa, MD Specialty: Interventional Pain Management Specialty designation: 09 Location: Outpatient facility Ref. Prov.: Venia Carbon, MD       Health-care intervention ([place] [facility] )    Procedure: Lumbar epidural steroid injection (LESI) (interlaminar) #1    Laterality: Left   Level:  L1-2 Level.  Imaging: Fluoroscopic guidance         Anesthesia: Local anesthesia (1-2% Lidocaine) DOS: 10/15/2022  Performed by: Gillis Santa, MD  Purpose: Diagnostic/Therapeutic Indications: Lumbar radicular pain of intraspinal etiology of more than 4 weeks that has failed to respond to conservative therapy and is severe enough to impact quality of life or function. 1. Spinal stenosis, lumbar region, with neurogenic claudication   2. Chronic radicular lumbar pain   3. History of lumbar fusion (L3-L5)    NAS-11 Pain score:   Pre-procedure: 3 /10   Post-procedure: 5 /10      Position / Prep / Materials:  Position: Prone w/ head of the table raised (slight reverse trendelenburg) to facilitate breathing.  Prep solution: DuraPrep (Iodine Povacrylex [0.7% available iodine] and Isopropyl Alcohol, 74% w/w) Prep Area: Entire Posterior Lumbar Region from lower scapular tip down to mid buttocks area and from flank to flank. Materials:  Tray: Epidural tray Needle(s):  Type: Epidural  needle (Tuohy) Gauge (G):  22 Length: Regular (3.5-in) Qty: 1   Pre-op H&P Assessment:  Ms. Graver is a 77 y.o. (year old), female patient, seen today for interventional treatment. She  has a past surgical history that includes Cesarean section (1978); Hemorrhoid surgery (2015); Colonoscopy; Transforaminal lumbar interbody fusion (tlif) with pedicle screw fixation 2 level (N/A, 11/07/2015); Joint replacement; Total hip arthroplasty (Left, 01/08/2018); Back surgery; and PACEMAKER LEADLESS INSERTION (N/A, 12/06/2021). Ms. Whittle has a current medication list which includes the following prescription(s): accu-chek softclix lancets, amlodipine, aripiprazole, aspirin ec, atorvastatin, donepezil, duloxetine, gabapentin, lisinopril-hydrochlorothiazide, multivitamin with minerals, onetouch verio, and tramadol. Her primarily concern today is the Back Pain  Initial Vital Signs:  Pulse/HCG Rate: (!) 103ECG Heart Rate: 94 Temp: (!) 96.9 F (36.1 C) Resp: 16 BP: 134/62 SpO2: 98 %  BMI: Estimated body mass index is 35.9 kg/m as calculated from the following:   Height as of this encounter: '5\' 1"'$  (1.549 m).   Weight as of this encounter: 190 lb (86.2 kg).  Risk Assessment: Allergies: Reviewed. She has No Known Allergies.  Allergy Precautions: None required Coagulopathies: Reviewed. None identified.  Blood-thinner therapy: None at this time Active Infection(s): Reviewed. None identified. Ms. Lynne is afebrile  Site Confirmation: Ms. Game was asked to confirm the procedure and laterality before marking the site Procedure checklist: Completed Consent: Before the procedure and under the influence of no sedative(s), amnesic(s), or anxiolytics, the patient was informed of the treatment options, risks and possible complications. To fulfill our ethical and legal obligations, as recommended by the American Medical Association's Code of Ethics, I have informed the patient of my clinical impression;  the nature and purpose of the treatment or procedure; the risks, benefits, and possible complications of the intervention; the  alternatives, including doing nothing; the risk(s) and benefit(s) of the alternative treatment(s) or procedure(s); and the risk(s) and benefit(s) of doing nothing. The patient was provided information about the general risks and possible complications associated with the procedure. These may include, but are not limited to: failure to achieve desired goals, infection, bleeding, organ or nerve damage, allergic reactions, paralysis, and death. In addition, the patient was informed of those risks and complications associated to Spine-related procedures, such as failure to decrease pain; infection (i.e.: Meningitis, epidural or intraspinal abscess); bleeding (i.e.: epidural hematoma, subarachnoid hemorrhage, or any other type of intraspinal or peri-dural bleeding); organ or nerve damage (i.e.: Any type of peripheral nerve, nerve root, or spinal cord injury) with subsequent damage to sensory, motor, and/or autonomic systems, resulting in permanent pain, numbness, and/or weakness of one or several areas of the body; allergic reactions; (i.e.: anaphylactic reaction); and/or death. Furthermore, the patient was informed of those risks and complications associated with the medications. These include, but are not limited to: allergic reactions (i.e.: anaphylactic or anaphylactoid reaction(s)); adrenal axis suppression; blood sugar elevation that in diabetics may result in ketoacidosis or comma; water retention that in patients with history of congestive heart failure may result in shortness of breath, pulmonary edema, and decompensation with resultant heart failure; weight gain; swelling or edema; medication-induced neural toxicity; particulate matter embolism and blood vessel occlusion with resultant organ, and/or nervous system infarction; and/or aseptic necrosis of one or more joints. Finally,  the patient was informed that Medicine is not an exact science; therefore, there is also the possibility of unforeseen or unpredictable risks and/or possible complications that may result in a catastrophic outcome. The patient indicated having understood very clearly. We have given the patient no guarantees and we have made no promises. Enough time was given to the patient to ask questions, all of which were answered to the patient's satisfaction. Ms. Lau has indicated that she wanted to continue with the procedure. Attestation: I, the ordering provider, attest that I have discussed with the patient the benefits, risks, side-effects, alternatives, likelihood of achieving goals, and potential problems during recovery for the procedure that I have provided informed consent. Date  Time: 10/15/2022 11:09 AM   Pre-Procedure Preparation:  Monitoring: As per clinic protocol. Respiration, ETCO2, SpO2, BP, heart rate and rhythm monitor placed and checked for adequate function Safety Precautions: Patient was assessed for positional comfort and pressure points before starting the procedure. Time-out: I initiated and conducted the "Time-out" before starting the procedure, as per protocol. The patient was asked to participate by confirming the accuracy of the "Time Out" information. Verification of the correct person, site, and procedure were performed and confirmed by me, the nursing staff, and the patient. "Time-out" conducted as per Joint Commission's Universal Protocol (UP.01.01.01). Time: 1126  Description/Narrative of Procedure:          Target: Epidural space via interlaminar opening, initially targeting the lower laminar border of the superior vertebral body. Region: Lumbar Approach: Percutaneous paravertebral  Rationale (medical necessity): procedure needed and proper for the diagnosis and/or treatment of the patient's medical symptoms and needs. Procedural Technique Safety Precautions: Aspiration  looking for blood return was conducted prior to all injections. At no point did we inject any substances, as a needle was being advanced. No attempts were made at seeking any paresthesias. Safe injection practices and needle disposal techniques used. Medications properly checked for expiration dates. SDV (single dose vial) medications used. Description of the Procedure: Protocol guidelines were followed. The procedure  needle was introduced through the skin, ipsilateral to the reported pain, and advanced to the target area. Bone was contacted and the needle walked caudad, until the lamina was cleared. The epidural space was identified using "loss-of-resistance technique" with 2-3 ml of PF-NaCl (0.9% NSS), in a 5cc LOR glass syringe.  5cc solution made of 2 cc of preservative-free saline, 2 cc of 0.2% ropivacaine, 1 cc of Decadron 10 mg/cc.   Vitals:   10/15/22 1129 10/15/22 1133 10/15/22 1135 10/15/22 1139  BP: (!) 160/129 (!) 141/108 (!) 156/118 (!) 148/57  Pulse:      Resp: 18 (!) 23 (!) 24   Temp:      SpO2: 94% 96% 97%   Weight:      Height:        Start Time: 1126 hrs. End Time: 1134 hrs.  Imaging Guidance (Spinal):          Type of Imaging Technique: Fluoroscopy Guidance (Spinal) Indication(s): Assistance in needle guidance and placement for procedures requiring needle placement in or near specific anatomical locations not easily accessible without such assistance. Exposure Time: Please see nurses notes. Contrast: Before injecting any contrast, we confirmed that the patient did not have an allergy to iodine, shellfish, or radiological contrast. Once satisfactory needle placement was completed at the desired level, radiological contrast was injected. Contrast injected under live fluoroscopy. No contrast complications. See chart for type and volume of contrast used. Fluoroscopic Guidance: I was personally present during the use of fluoroscopy. "Tunnel Vision Technique" used to obtain the  best possible view of the target area. Parallax error corrected before commencing the procedure. "Direction-depth-direction" technique used to introduce the needle under continuous pulsed fluoroscopy. Once target was reached, antero-posterior, oblique, and lateral fluoroscopic projection used confirm needle placement in all planes. Images permanently stored in EMR. Interpretation: I personally interpreted the imaging intraoperatively. Adequate needle placement confirmed in multiple planes. Appropriate spread of contrast into desired area was observed. No evidence of afferent or efferent intravascular uptake. No intrathecal or subarachnoid spread observed. Permanent images saved into the patient's record.  Antibiotic Prophylaxis:   Anti-infectives (From admission, onward)    None      Indication(s): None identified  Post-operative Assessment:  Post-procedure Vital Signs:  Pulse/HCG Rate: (!) 10398 Temp: (!) 96.9 F (36.1 C) Resp: (!) 24 BP: (!) 148/57 (sitting up) SpO2: 97 %  EBL: None  Complications: No immediate post-treatment complications observed by team, or reported by patient.  Note: The patient tolerated the entire procedure well. A repeat set of vitals were taken after the procedure and the patient was kept under observation following institutional policy, for this type of procedure. Post-procedural neurological assessment was performed, showing return to baseline, prior to discharge. The patient was provided with post-procedure discharge instructions, including a section on how to identify potential problems. Should any problems arise concerning this procedure, the patient was given instructions to immediately contact us, at any time, without hesitation. In any case, we plan to contact the patient by telephone for a follow-up status report regarding this interventional procedure.  Comments:  No additional relevant information.  Plan of Care (POC)  Orders:  Orders Placed This  Encounter  Procedures   DG PAIN CLINIC C-ARM 1-60 MIN NO REPORT    Intraoperative interpretation by procedural physician at [place].    Standing Status:   Standing    Number of Occurrences:   1    Order Specific Question:   Reason for exam:  Answer:   Assistance in needle guidance and placement for procedures requiring needle placement in or near specific anatomical locations not easily accessible without such assistance.     Medications ordered for procedure: Meds ordered this encounter  Medications   iohexol (OMNIPAQUE) 180 MG/ML injection 10 mL    Must be Myelogram-compatible. If not available, you may substitute with a water-soluble, non-ionic, hypoallergenic, myelogram-compatible radiological contrast medium.   lidocaine (XYLOCAINE) 2 % (with pres) injection 400 mg   sodium chloride flush (NS) 0.9 % injection 2 mL   ropivacaine (PF) 2 mg/mL (0.2%) (NAROPIN) injection 2 mL   dexamethasone (DECADRON) injection 10 mg   Medications administered: We administered iohexol, lidocaine, sodium chloride flush, ropivacaine (PF) 2 mg/mL (0.2%), and dexamethasone.  See the medical record for exact dosing, route, and time of administration.  Follow-up plan:   Return in about 3 weeks (around 11/05/2022) for Post Procedure Evaluation, VV.       Left L1/2 ESI 10/15/22    Recent Visits Date Type Provider Dept  09/25/22 Office Visit Gillis Santa, MD [facility]  Showing recent visits within past 90 days and meeting all other requirements Today's Visits Date Type Provider Dept  10/15/22 Procedure visit Gillis Santa, MD [facility]  Showing today's visits and meeting all other requirements Future Appointments No visits were found meeting these conditions. Showing future appointments within next 90 days and meeting all other requirements  Disposition: Discharge home  Discharge (Date  Time): 10/15/2022; 1141 hrs.   Primary Care Physician: Venia Carbon, MD Location: [facility] Note by: Gillis Santa, MD Date: 10/15/2022; Time: 11:41 AM  Disclaimer:  Medicine is not an exact science. The only guarantee in medicine is that nothing is guaranteed. It is important to note that the decision to proceed with this intervention was based on the information collected from the patient. The Data and conclusions were drawn from the patient's questionnaire, the interview, and the physical examination. Because the information was provided in large part by the patient, it cannot be guaranteed that it has not been purposely or unconsciously manipulated. Every effort has been made to obtain as much relevant data as possible for this evaluation. It is important to note that the conclusions that lead to this procedure are derived in large part from the available data. Always take into account that the treatment will also be dependent on availability of resources and existing treatment guidelines, considered by other Pain Management Practitioners as being common knowledge and practice, at the time of the intervention. For Medico-Legal purposes, it is also important to point out that variation in procedural techniques and pharmacological choices are the acceptable norm. The indications, contraindications, technique, and results of the above procedure should only be interpreted and judged by a Board-Certified Interventional Pain Specialist with extensive familiarity and expertise in the same exact procedure and technique.

## 2022-10-16 ENCOUNTER — Telehealth: Payer: Self-pay

## 2022-10-16 NOTE — Telephone Encounter (Signed)
Post procedure follow up.  Patient states she is doing good she guesses.  She had a nose bleed yesterday after procedure and had to call EMS to get it stopped but states that there has been no more nose bleeding.

## 2022-10-17 ENCOUNTER — Ambulatory Visit: Payer: Self-pay | Admitting: *Deleted

## 2022-10-17 NOTE — Patient Instructions (Signed)
Visit Information  Thank you for taking time to visit with me today. Please don't hesitate to contact me if I can be of assistance to you.   Following are the goals we discussed today:   Goals Addressed             This Visit's Progress    "I need in home care services"       Care Coordination Interventions: Need for personal care services and possible Meals on wheels discussed due to patient's son's availability being limited in the upcoming months Request for independent assessment for personal care services form to be faxed to patient's provider to complete  Meal on Wheels referral to be made-VM left for a return call           Our next appointment is by telephone on 10/31/22 at 11am  Please call the care guide team at [phone] if you need to cancel or reschedule your appointment.   If you are experiencing a Mental Health or Parole or need someone to talk to, please call 911   The patient verbalized understanding of instructions, educational materials, and care plan provided today and DECLINED offer to receive copy of patient instructions, educational materials, and care plan.   Telephone follow up appointment with care management team member scheduled for: 10/31/22  Elliot Gurney, [place] Worker  [facility] Management [phone]

## 2022-10-17 NOTE — Patient Outreach (Signed)
  Care Coordination   Initial Visit Note   10/17/2022 Name: [name] MRN: [id number] DOB: Jun 26, 1946  [name] is a 77 y.o. year old female who sees Venia Carbon, MD for primary care. I spoke with  [name] by phone today.  What matters to the patients health and wellness today?  Personal care services. Meals on Wheels referral    Goals Addressed             This Visit's Progress    "I need in home care services"       Care Coordination Interventions: Need for personal care services and possible Meals on wheels discussed due to patient's son's availability being limited in the upcoming months Request for independent assessment for personal care services form to be faxed to patient's provider to complete  Meal on Wheels referral to be made-VM left for a return call           SDOH assessments and interventions completed:  Yes  SDOH Interventions Today    Flowsheet Row Most Recent Value  SDOH Interventions   Food Insecurity Interventions Intervention Not Indicated  Housing Interventions Intervention Not Indicated  Transportation Interventions Intervention Not Indicated        Care Coordination Interventions:  Yes, provided   Interventions Today    Flowsheet Row Most Recent Value  General Interventions   General Interventions Discussed/Reviewed Community Resources, General Interventions Discussed  [Personal care services, meals on wheels]        Follow up plan: Follow up call scheduled for 10/31/22    Encounter Outcome:  Pt. Visit Completed

## 2022-10-20 IMAGING — MR MR LUMBAR SPINE WO/W CM
5 of 8 series · 30 of 48 positions shown · IV contrast (gadavist)
Comparison: Lumbar CT myelogram 10/28/2016.  Lumbar MRI 04/11/2018.

CLINICAL DATA: 75-year-old female with chronic back pain, prior
surgery.

EXAM:
MRI LUMBAR SPINE WITHOUT AND WITH CONTRAST
TECHNIQUE: Multiplanar and multiecho pulse sequences of the lumbar spine were
obtained without and with intravenous contrast.
CONTRAST:  7.5mL GADAVIST GADOBUTROL 1 MMOL/ML IV SOLN

[Series 9: T2 · sagittal · 4.0mm · 0.81mm/px · 6 of 18 slices shown (1 of 2)]
[im 1/18]
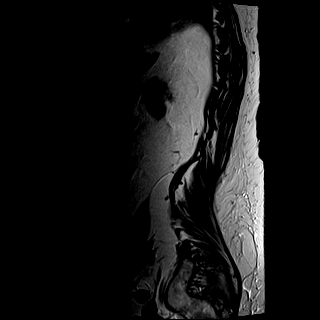
[im 4/18]
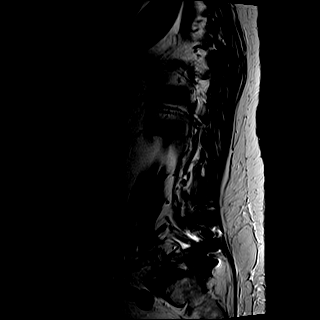
[im 7/18]
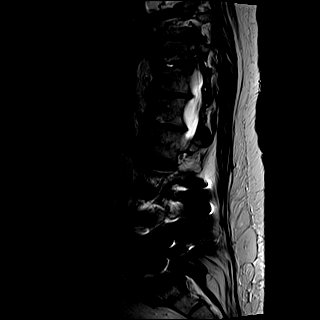
[im 11/18]
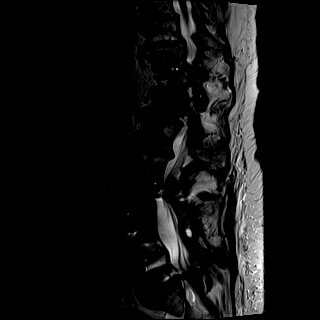
[im 14/18]
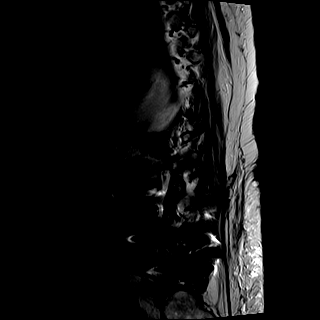
[im 18/18]
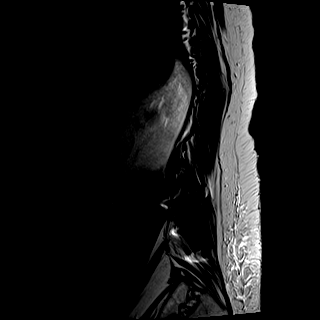

[Series 10: T1 · sagittal · 4.0mm · 0.81mm/px · 5 of 18 slices shown (1 of 2)]
[im 1/18]
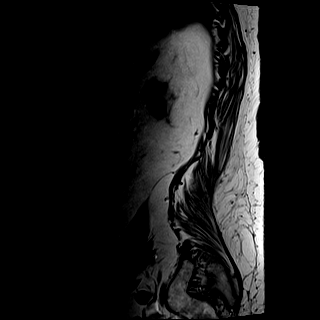
[im 5/18]
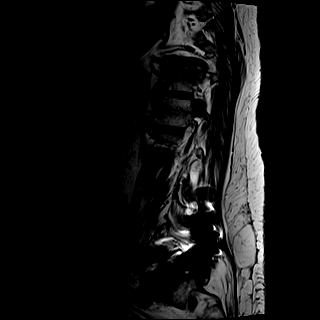
[im 9/18]
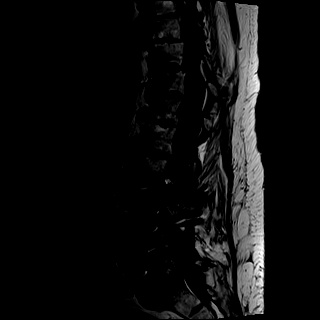
[im 13/18]
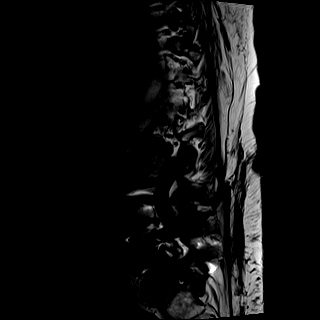
[im 18/18]
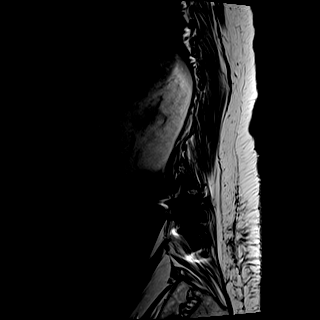

[Series 14: T2 · axial · 4.0mm · 0.78mm/px · z∈[-9,+184]mm · 9 of 33 slices shown (2 of 2)]
[im 1/33]
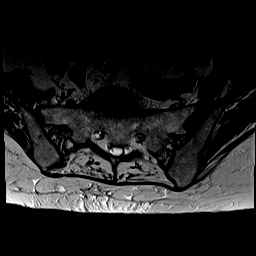
[im 5/33]
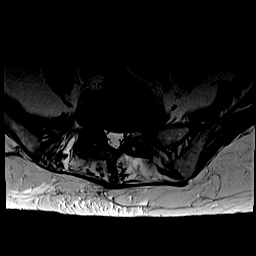
[im 9/33]
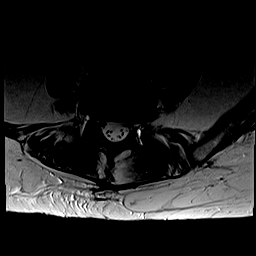
[im 13/33]
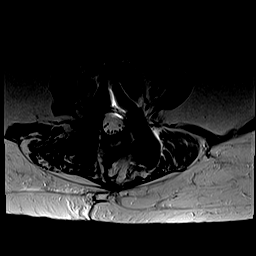
[im 17/33]
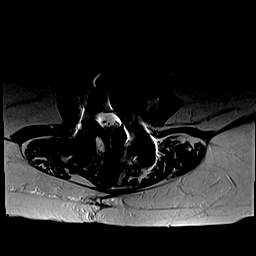
[im 21/33]
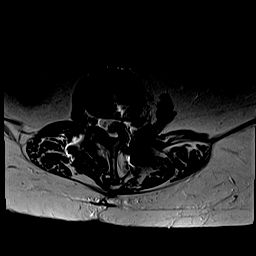
[im 25/33]
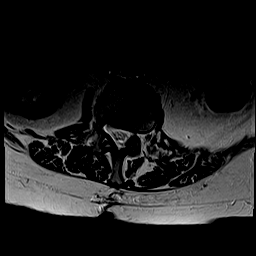
[im 29/33]
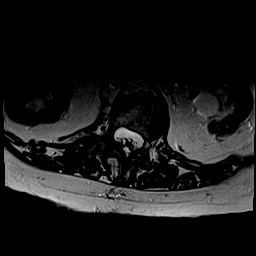
[im 33/33]
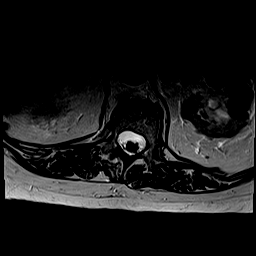

[Series 17: T1 · axial · 4.0mm · 0.39mm/px · z∈[-9,+184]mm · 9 of 33 slices shown (2 of 2)]
[im 1/33]
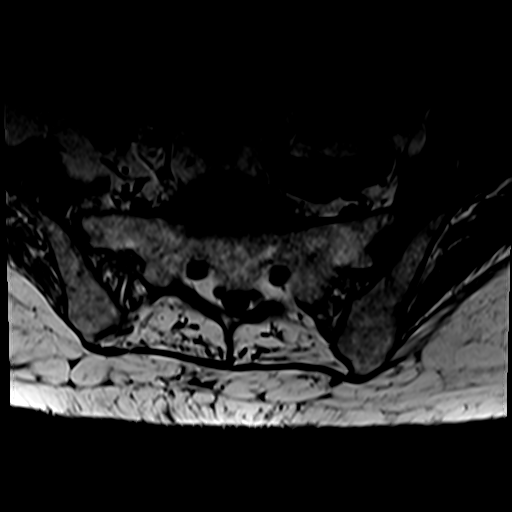
[im 5/33]
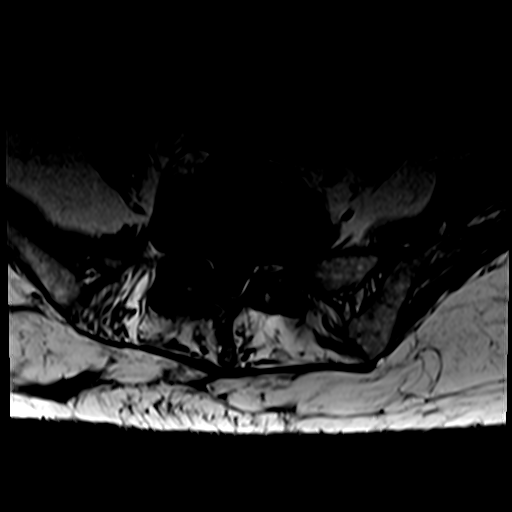
[im 9/33]
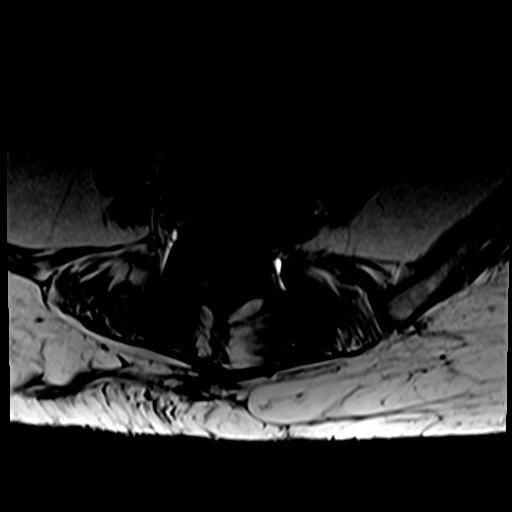
[im 13/33]
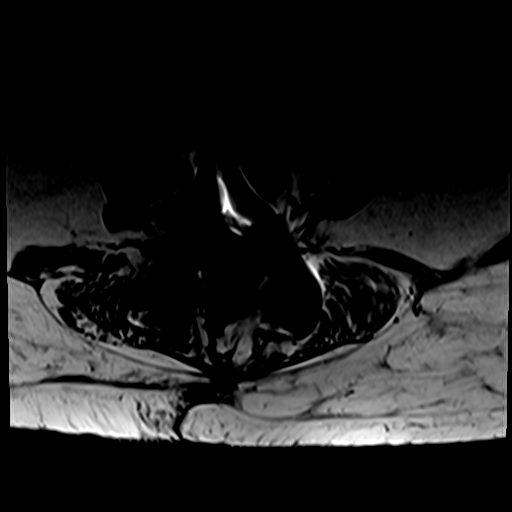
[im 17/33]
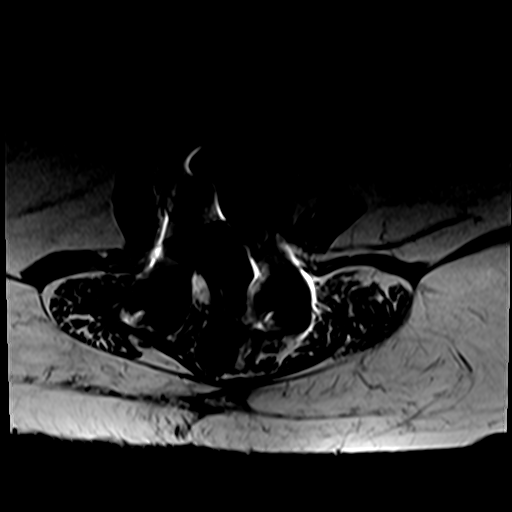
[im 21/33]
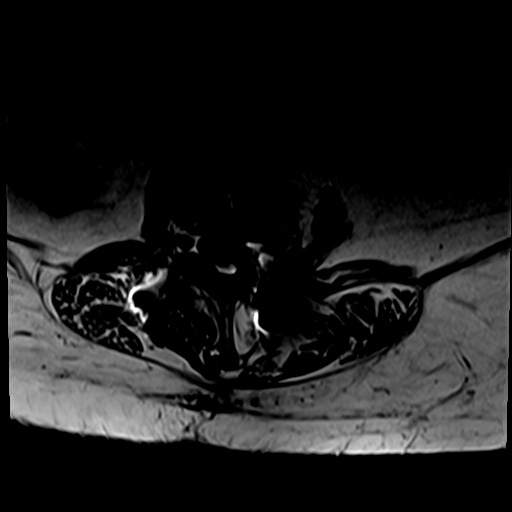
[im 25/33]
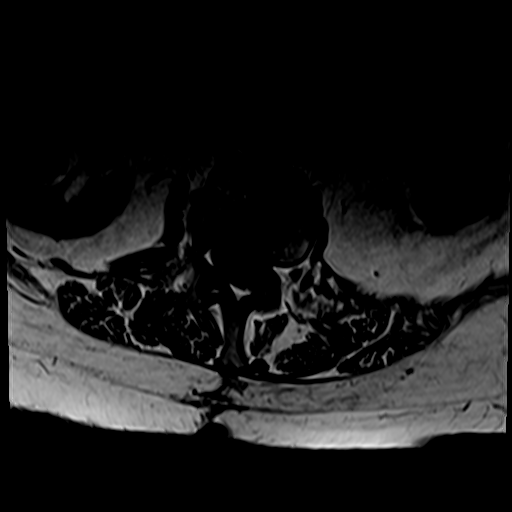
[im 29/33]
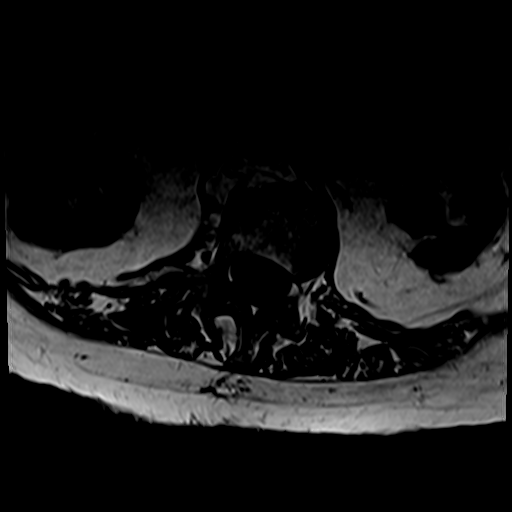
[im 33/33]
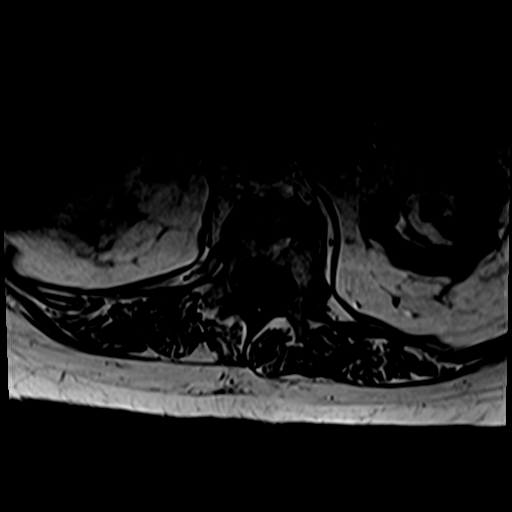

[Series 18: T1 fat-sat post-contrast · sagittal · 4.0mm · 0.81mm/px · 1 of 18 slices shown]
[im 1/18]
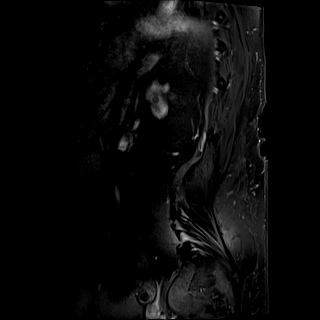

[30 of 48 positions shown; findings below may reference images not displayed]

FINDINGS: Segmentation: Normal on the comparison CT myelogram, which is the
same numbering system used on the 5172 MRI.

Alignment: Chronic levoconvex lower thoracic and dextroconvex lumbar
scoliosis. Chronic grade 1 anterolisthesis at L4-L5 and L5-S1. New
grade 1 anterolisthesis at T10-T11 since [DATE] mm. And mild
anterolisthesis also at T8-T9 and T9-T10.

Vertebrae: Hardware susceptibility artifact related to chronic L3
through L5 fusion hardware. There does appear to be superimposed
degenerative marrow edema at the S1 superior endplate. Faint
degenerative endplate marrow edema also at T10-T11. Chronic
degenerative endplate marrow signal changes elsewhere. Normal
background bone marrow signal. Intact visible sacrum.

Conus medullaris and cauda equina: Conus extends to the T12-L1
level. No lower spinal cord or conus signal abnormality. No abnormal
intradural enhancement. No dural thickening identified.

Paraspinal and other soft tissues: Postoperative changes to the
lumbar paraspinal soft tissues. Stable and negative visible
abdominal viscera.

Disc levels:

Lower thoracic levels T8-T9 through T10-T11 demonstrate
anterolisthesis and disc space loss. New heterogeneous increased T2
and STIR hyperintensity in the T10-T11 disc although no associated
enhancement or paraspinal inflammation. Posterior disc bulging at
each of those levels combined with posterior element hypertrophy
appears to result in mild spinal stenosis at both T8-T9 and T10-T11
(series 9, image 10) which is new since 5172. Possible mild spinal
cord mass effect. No cord signal abnormality. There is moderate to
severe left T8, bilateral T9, and left T10 neural foraminal
stenosis.

T11-T12: Disc space loss and circumferential disc bulge eccentric to
the right appears stable since 5172 with superimposed facet
hypertrophy, mild right T11 foraminal stenosis.

T12-L1: Stable disc space loss and circumferential disc osteophyte
complex with mild right T12 foraminal stenosis.

L1-L2: Stable disc space loss and circumferential disc osteophyte
complex with borderline to mild spinal stenosis.

L2-L3: Chronic disc space loss with circumferential disc osteophyte
complex eccentric to the left. Mild to moderate facet and ligament
flavum hypertrophy greater on the left. Chronic left lateral recess
stenosis is moderate to severe (left L3 nerve level). But only mild
spinal and left L2 foraminal stenosis. This level has not
significantly changed.

L3-L4:  Stable prior decompression and fusion.

L4-L5:  Stable prior decompression and fusion.

L5-S1: Chronic anterolisthesis with progressed disc desiccation and
vacuum disc since 5172. Circumferential disc/pseudo disc with bulky
bilateral foraminal involvement. Mild facet hypertrophy. No spinal
or lateral recess stenosis but moderate to severe left greater than
right L5 foraminal stenosis may have increased.
IMPRESSION: 1. Stable prior decompression and fusion at L3-L4 and L4-L5.

2. Progressed adjacent segment disease at L5-S1 with disc space loss
and vacuum disc since 5172. Moderate to severe bilateral foraminal
stenosis at that level may have progressed, greater on the left.

3. Stable adjacent segment disease at L2-L3 with moderate to severe
left lateral recess stenosis.

4. Progressed lower thoracic spine degeneration since 5172 with new
grade 1 anterolisthesis at T10-T11, and advanced chronic disc space
loss and mild anterolisthesis at T8-T9 and T9-T10.
Subsequent multifactorial mild spinal stenosis at both T8-T9 and
T10-T11 with possible mild spinal cord mass effect at both levels.
No cord signal abnormality. Moderate to severe left T8, bilateral
T9, and left T10 neural foraminal stenosis.

## 2022-10-21 ENCOUNTER — Other Ambulatory Visit: Payer: Self-pay

## 2022-10-21 ENCOUNTER — Emergency Department
Admission: EM | Admit: 2022-10-21 | Discharge: 2022-10-21 | Disposition: A | Discharge status: Home / Self Care | Payer: 59 | Attending: Emergency Medicine | Admitting: Emergency Medicine

## 2022-10-21 DIAGNOSIS — R58 Hemorrhage, not elsewhere classified: Secondary | ICD-10-CM | POA: Diagnosis not present

## 2022-10-21 DIAGNOSIS — R040 Epistaxis: Secondary | ICD-10-CM

## 2022-10-21 DIAGNOSIS — I10 Essential (primary) hypertension: Secondary | ICD-10-CM | POA: Diagnosis not present

## 2022-10-21 MED ORDER — ACETAMINOPHEN 500 MG PO TABS
1000.0000 mg | ORAL_TABLET | Freq: Once | ORAL | Status: AC
  Administered 2022-10-21: 1000 mg via ORAL
  Filled 2022-10-21: qty 2

## 2022-10-21 MED ORDER — OXYMETAZOLINE HCL 0.05 % NA SOLN
1.0000 | Freq: Once | NASAL | Status: AC
  Administered 2022-10-21: 1 via NASAL
  Filled 2022-10-21: qty 30

## 2022-10-21 NOTE — ED Notes (Signed)
So far no bleeding from the nare after the clamp was removed.

## 2022-10-21 NOTE — ED Provider Notes (Signed)
[facility] Provider Note    Event Date/Time   First MD Initiated Contact with Patient 10/21/22 [id number]     (approximate)   History   Epistaxis   HPI  [name] is a 77 y.o. female who presents by EMS for evaluation of a left-sided nosebleed.  She said that it has been going on during the night tonight and started while she was sleeping.  She has not been taking any blood pressure medicines for the last 6 months or so and is not concerned about that.  She does not take any blood thinners including taking no daily aspirin.  She said that she has not sustained any trauma to her nose and she does not think she picks her nose.        Triage Vital Signs: ED Triage Vitals  Enc Vitals Group     BP 10/21/22 0453 (!) 159/124     Pulse Rate 10/21/22 0453 (!) 56     Resp 10/21/22 0453 19     Temp 10/21/22 0453 97.8 F (36.6 C)     Temp Source 10/21/22 0453 Oral     SpO2 10/21/22 0453 95 %     Weight 10/21/22 0454 91 kg (200 lb 9.9 oz)     Height 10/21/22 0454 1.549 m (5' 1"$ )     Head Circumference --      Peak Flow --      Pain Score --      Pain Loc --      Pain Edu? --      Excl. in [place]? --     Most recent vital signs: Vitals:   10/21/22 0500 10/21/22 0618  BP: (!) 146/96 (!) 152/80  Pulse: 75 88  Resp: 18 18  Temp:    SpO2: 96% 94%    General: Awake, no distress.  CV:  Good peripheral perfusion.  Resp:  Normal effort. Speaking easily and comfortably, no accessory muscle usage nor intercostal retractions.   Abd:  No distention.  Other:  Patient has a small amount of blood coming from her left naris.  No sign of trauma.  Difficult to appreciate on exam with nasal speculum where is the source of bleeding.   ED Results / Procedures / Treatments   Labs (all labs ordered are listed, but only abnormal results are displayed) Labs Reviewed - No data to display   100 PROCEDURES:  Critical Care performed: No  Procedures   MEDICATIONS  ORDERED IN ED: Medications  oxymetazoline (AFRIN) 0.05 % nasal spray 1 spray (1 spray Each Nare Given 10/21/22 0555)  acetaminophen (TYLENOL) tablet 1,000 mg (1,000 mg Oral Given 10/21/22 [id number])     IMPRESSION / MDM / ASSESSMENT AND PLAN / ED COURSE  I reviewed the triage vital signs and the nursing notes.                              Differential diagnosis includes, but is not limited to, traumatic epistaxis, atraumatic epistaxis, bleeding diathesis.  Patient's presentation is most consistent with acute, uncomplicated illness.  Epistaxis is very minimal and I suspect that it stopped (she used Afrin and direct pressure at home), but after repeatedly touching her nose and checking it to see if it is bleeding, it causes her bleeding again.  We talked about how to avoid this happening and I placed Afrin in her left naris and put a nasal clip in  place.  The plan is to leave it for 15 to 20 minutes and then I will recheck and see if it is appropriate for discharge or if she needs additional intervention.  I offered to put in a Merocel to begin with but she declined the more invasive approaches I would prefer to try and be conservative.     Clinical Course as of 10/21/22 0824  Tue Oct 21, 2022  0653 Patient is no longer bleeding.  We again talked about placing a Merocel, but neither she nor I think that would be a good idea.  I am going to touch base with ENT to see if they feel that she could stop by the clinic to be seen later this morning.  She agrees with this plan. [CF]  [id number] I consulted Dr. Tami Ribas by phone to ask about follow-up options.  He advised that because they do not have walk-in options, 2 of the ENT surgeons will be in the operating room this morning, and one of them is in [place] while one of them is out of the country, it would be best to have the patient return home and call the office to schedule a follow-up appointment, preferably for later this afternoon if possible or possibly  tomorrow.  I updated the patient about this plan and she understands and agrees.  I gave her the nasal clip and the Afrin and she will try to treat herself at home and follow-up when possible.  I gave my usual and customary return precautions.  There is no indication for lab work at this time. [CF]    Clinical Course User Index [CF] Hinda Kehr, MD     FINAL CLINICAL IMPRESSION(S) / ED DIAGNOSES   Final diagnoses:  Left-sided epistaxis     Rx / DC Orders   ED Discharge Orders     None        Note:  This document was prepared using Dragon voice recognition software and may include unintentional dictation errors.   Hinda Kehr, MD 10/21/22 [phone]

## 2022-10-21 NOTE — ED Notes (Signed)
ED Provider at bedside. 

## 2022-10-21 NOTE — ED Triage Notes (Signed)
Pt presents to ER via ems from home with c/o left side nosebleed that started while pt was sleeping tonight.  Pt has hx of HTN and has not been taking any BP meds for last 6 months or so.  Pt denies taking any blood thinners.  Bleeding controlled prior to ems arrival.  Pt is A&O x4 and in NAD on arrival.

## 2022-10-21 NOTE — ED Notes (Signed)
Nare clamp removed.

## 2022-10-21 NOTE — Discharge Instructions (Signed)

## 2022-10-29 ENCOUNTER — Telehealth: Payer: Self-pay | Admitting: *Deleted

## 2022-10-29 NOTE — Patient Outreach (Signed)
  Care Coordination   Follow Up Visit Note   10/29/2022 Name: [name] MRN: [id number] DOB: Jan 20, 1946  [name] is a 77 y.o. year old female who sees Venia Carbon, MD for primary care. I  spoke with [facility]-Meals on Wheels program  What matters to the patients health and wellness today? Meals on Wheels and personal care services    Goals Addressed             This Visit's Progress    "I need in home care services"       Care Coordination Interventions:  Meals on wheels referral completed with [facility] Patient added to the wait list, once her name comes up she will be assessed in her home by a case worker form [facility]  Request for independent assessment for personal care services form  faxed to patient's provider to complete             SDOH assessments and interventions completed:  No     Care Coordination Interventions:  Yes, provided  Interventions Today    Flowsheet Row Most Recent Value  Chronic Disease   Chronic disease during today's visit Diabetes, Congestive Heart Failure (CHF)  General Interventions   General Interventions Discussed/Reviewed General Interventions Reviewed, [employer] completed for Meals on Wheels]       Follow up plan: Follow up call scheduled for 10/31/22    Encounter Outcome:  Pt. Visit Completed

## 2022-10-31 ENCOUNTER — Ambulatory Visit: Payer: Self-pay | Admitting: *Deleted

## 2022-10-31 NOTE — Patient Instructions (Signed)
Visit Information  Thank you for taking time to visit with me today. Please don't hesitate to contact me if I can be of assistance to you.   Following are the goals we discussed today:   Goals Addressed             This Visit's Progress    "I need in home care services"       Care Coordination Interventions:  Meals on wheels referral completed with [facility] confirmed that patient is now on waiting list Request for independent assessment for personal care services form  faxed to patient's provider to complete-message sent to provider with status update on the form            Our next appointment is by telephone on 11/07/22 at 11am  Please call the care guide team at [phone] if you need to cancel or reschedule your appointment.   If you are experiencing a Mental Health or [place] or need someone to talk to, please call 911   Patient verbalizes understanding of instructions and care plan provided today and agrees to view in [place]. Active MyChart status and patient understanding of how to access instructions and care plan via MyChart confirmed with patient.     Telephone follow up appointment with care management team member scheduled for: 11/07/22  Elliot Gurney, [place] Worker  [facility] Care Management [phone]

## 2022-10-31 NOTE — Patient Outreach (Signed)
  Care Coordination   Follow Up Visit Note   10/31/2022 Name: [name] MRN: [id number] DOB: 05/10/1946  [name] is a 77 y.o. year old female who sees Alexis Carbon, MD for primary care. I spoke with  [name] by phone today.  What matters to the patients health and wellness today?  Meals on Wheels, Personal Care Velez    Goals Addressed             This Visit's Progress    "I need in home care Velez"       Care Coordination Interventions:  Meals on wheels referral completed with [name] confirmed that patient is now on waiting list Request for independent assessment for personal care Velez form  faxed to patient's provider to complete-message sent to provider with status update on the form            SDOH assessments and interventions completed:  No     Care Coordination Interventions:  Yes, provided  Interventions Today    Flowsheet Row Most Recent Value  General Interventions   General Interventions Discussed/Reviewed General Interventions Reviewed, Level of Care  [Meals on Wheels referral confirmed]  Level of [name]  [will plan to follow up with provider regarding referral for [name]]       Follow up plan: Follow up call scheduled for 11/07/22    Encounter Outcome:  Pt. Visit Completed

## 2022-11-03 ENCOUNTER — Encounter: Payer: Self-pay | Admitting: Internal Medicine

## 2022-11-05 ENCOUNTER — Telehealth: Payer: Self-pay | Admitting: *Deleted

## 2022-11-05 ENCOUNTER — Encounter: Payer: Self-pay | Admitting: *Deleted

## 2022-11-05 NOTE — Patient Outreach (Signed)
  Care Coordination   Follow Up Visit Note   11/05/2022 Name: [name] MRN: [id number] DOB: Sep 05, 1946  [name] is a 77 y.o. year old female who sees Venia Carbon, MD for primary care. I  followed up with patient's provider regarding request forms for personal care services. Forms completed and returned, however cannot be found. Forms re-faxed for completion to Beatriz Stallion [phone]  What matters to the patients health and wellness today?  Personal care services    Goals Addressed             This Visit's Progress    "I need in home care services"       Care Coordination Interventions:  Meals on wheels referral previously completed with [facility] confirmed that patient is now on waiting list Request for independent assessment for personal care services form  re-faxed to patient's provider to complete-per providers office form was completed was not received            SDOH assessments and interventions completed:  No     Care Coordination Interventions:  Yes, provided  Interventions Today    Flowsheet Row Most Recent Value  Chronic Disease   Chronic disease during today's visit Diabetes, Chronic Obstructive Pulmonary Disease (COPD)  General Interventions   General Interventions Discussed/Reviewed Communication with  Communication with PCP/Specialists  Level of [place]  [request made for pcs to be re-submitted]       Follow up plan: Follow up call scheduled for 11/07/22    Encounter Outcome:  Pt. Visit Completed

## 2022-11-06 ENCOUNTER — Other Ambulatory Visit: Payer: Self-pay | Admitting: Internal Medicine

## 2022-11-07 ENCOUNTER — Ambulatory Visit: Payer: Self-pay | Admitting: *Deleted

## 2022-11-07 NOTE — Patient Instructions (Signed)
Visit Information  Thank you for taking time to visit with me today. Please don't hesitate to contact me if I can be of assistance to you.   Following are the goals we discussed today:   Goals Addressed             This Visit's Progress    "I need in home care services"       Care Coordination Interventions:  Meals on wheels referral previously completed with [place] confirmed that patient is now on waiting list Request for independent assessment for personal care services form  received and faxed to [facility] for processing            Our next appointment is by telephone on 11/13/32 at 2:30pm  Please call the care guide team at [phone] if you need to cancel or reschedule your appointment.   If you are experiencing a Mental Health or [place] or need someone to talk to, please call 911   Patient verbalizes understanding of instructions and care plan provided today and agrees to view in [place]. Active MyChart status and patient understanding of how to access instructions and care plan via MyChart confirmed with patient.     Telephone follow up appointment with care management team member scheduled for: 11/14/22  Elliot Gurney, [place] Worker  [facility] Care Management [phone]

## 2022-11-07 NOTE — Patient Outreach (Signed)
  Care Coordination   Follow Up Visit Note   11/07/2022 Name: [name] MRN: [id number] DOB: Nov 28, 1945  [name] is a 77 y.o. year old female who sees Venia Carbon, MD for primary care. I spoke with  [name] by phone today.  What matters to the patients health and wellness today?  Personal care services    Goals Addressed             This Visit's Progress    "I need in home care services"       Care Coordination Interventions:  Meals on wheels referral previously completed with [facility] confirmed that patient is now on waiting list Request for independent assessment for personal care services form  received and faxed to [facility] for processing            SDOH assessments and interventions completed:  No     Care Coordination Interventions:  Yes, provided  Interventions Today    Flowsheet Row Most Recent Value  Chronic Disease   Chronic disease during today's visit Diabetes, Chronic Obstructive Pulmonary Disease (COPD)  General Interventions   General Interventions Discussed/Reviewed General Interventions Reviewed, Communication with  Communication with PCP/Specialists  Level of [place]  [request form for personal care services received and faxed to [facility] for processing]       Follow up plan: Follow up call scheduled for 11/14/22    Encounter Outcome:  Pt. Visit Completed

## 2022-11-08 ENCOUNTER — Encounter: Payer: Self-pay | Admitting: Internal Medicine

## 2022-11-10 ENCOUNTER — Ambulatory Visit
Payer: 59 | Attending: Student in an Organized Health Care Education/Training Program | Admitting: Student in an Organized Health Care Education/Training Program

## 2022-11-10 ENCOUNTER — Encounter: Payer: Self-pay | Admitting: Student in an Organized Health Care Education/Training Program

## 2022-11-10 DIAGNOSIS — Z981 Arthrodesis status: Secondary | ICD-10-CM

## 2022-11-10 DIAGNOSIS — M48062 Spinal stenosis, lumbar region with neurogenic claudication: Secondary | ICD-10-CM | POA: Diagnosis not present

## 2022-11-10 DIAGNOSIS — M5416 Radiculopathy, lumbar region: Secondary | ICD-10-CM

## 2022-11-10 DIAGNOSIS — G8929 Other chronic pain: Secondary | ICD-10-CM | POA: Diagnosis not present

## 2022-11-10 DIAGNOSIS — M4804 Spinal stenosis, thoracic region: Secondary | ICD-10-CM

## 2022-11-10 NOTE — Progress Notes (Signed)
Patient: [name]  Service Category: E/M  Provider: Gillis Santa, MD  DOB: [birth year]  DOS: 11/10/2022  Location: Office  MRN: [id number]  Setting: Ambulatory outpatient  Referring Provider: Venia Carbon, MD  Type: Established Patient  Specialty: Interventional Pain Management  PCP: Venia Carbon, MD  Location: Remote location  Delivery: TeleHealth     Virtual Encounter - Pain Management PROVIDER NOTE: Information contained herein reflects review and annotations entered in association with encounter. Interpretation of such information and data should be left to medically-trained personnel. Information provided to patient can be located elsewhere in the medical record under "Patient Instructions". Document created using STT-dictation technology, any transcriptional errors that may result from process are unintentional.    Contact & Pharmacy Preferred: [phone] Home: [phone] (home) Mobile: [phone] (mobile) E-mail: [email or web address]  [name]Phone: [phone]: [address] [place] [id number]: [phone]: [phone]  Pre-screening  Ms. KSecundinooffered "in-person" vs "virtual" encounter. She indicated preferring virtual for this encounter.   Reason COVID-19*  Social distancing based on CDC and AMA recommendations.   I contacted LRIELLY NOFTZon 11/10/2022 via telephone.      I clearly identified myself as BGillis Santa MD. I verified that I was speaking with the correct person using two identifiers (Name: LKAHO KUSSMAN and date of birth: 808-Feb-1947.  Consent I sought verbal advanced consent from LMarc Morgansfor virtual visit interactions. I informed Ms. KBattistiniof possible security and privacy concerns, risks, and limitations associated with  providing "not-in-person" medical evaluation and management services. I also informed Ms. KSchaadof the availability of "in-person" appointments. Finally, I informed her that there would be a charge for the virtual visit and that she could be  personally, fully or partially, financially responsible for it. Ms. KDewitzexpressed understanding and agreed to proceed.   Historic Elements   Ms. LANQUANETTE BIONDOis a 77y.o. year old, female patient evaluated today after our last contact on 10/15/2022. Ms. KSearer has a past medical history of Arthritis, Confusion, Diabetes mellitus without complication (HCraig, Hepatitis, Hypertension, Lumbar stenosis, Memory loss, Nocturia, Numbness and tingling, and Wears glasses. She also  has a past surgical history that includes Cesarean section (1978); Hemorrhoid surgery (2015); Colonoscopy; Transforaminal lumbar interbody fusion (tlif) with pedicle screw fixation 2 level (N/A, 11/07/2015); Joint replacement; Total hip arthroplasty (Left, 01/08/2018); Back surgery; and PACEMAKER LEADLESS INSERTION (N/A, 12/06/2021). Ms. KProcellhas a current medication list which includes the following prescription(s): accu-chek softclix lancets, amlodipine, aripiprazole, aspirin ec, atorvastatin, donepezil, duloxetine, gabapentin, lisinopril-hydrochlorothiazide, multivitamin with minerals, onetouch verio, and tramadol. She  reports that she has never smoked. She has never been exposed to tobacco smoke. She has never used smokeless tobacco. She reports that she does not drink alcohol and does not use drugs. Ms. KSalgueirohas No Known Allergies.  Estimated body mass index is 37.91 kg/m as calculated from the following:   Height as of 10/21/22: '5\' 1"'$  (1.549 m).   Weight as of 10/21/22: 200 lb 9.9 oz (91 kg).  HPI  Today, she is being contacted for a post-procedure assessment.  Unfortunately her lumbar interlaminar injection was not very helpful.  She continues to have severe and persistent  low back pain with radiation into her right buttocks and right posterior lateral leg  stopping at her knee.  Postprocedural evaluation details are below.   HPI from initial clinic visit: LAMA BURCKHARD presents today with lower back pain with radiation in back buttocks and posterior legs to knees. History of lumbar spinal fusion (L3-5). Unable to ambulate without a walker and develops weakness of bilateral legs with even walking a short distance. Currently managed on medications with PCP including Cymbalta 60 mg daily, Gabapentin 900 mg nightly, and Tramadol 50 mg 4-6 hrs prn.  She has tried physical therapy in the past with limited response.  MRI below shows spondylosis given adjacent segment disease above lumbar fusion spinal fusion as well as thoracic canal stenosis.   Post-procedure evaluation   Procedure: Lumbar epidural steroid injection (LESI) (interlaminar) #1    Laterality: Left   Level:  L1-2 Level.  Imaging: Fluoroscopic guidance         Anesthesia: Local anesthesia (1-2% Lidocaine) DOS: 10/15/2022  Performed by: Gillis Santa, MD  Purpose: Diagnostic/Therapeutic Indications: Lumbar radicular pain of intraspinal etiology of more than 4 weeks that has failed to respond to conservative therapy and is severe enough to impact quality of life or function. 1. Spinal stenosis, lumbar region, with neurogenic claudication   2. Chronic radicular lumbar pain   3. History of lumbar fusion (L3-L5)    NAS-11 Pain score:   Pre-procedure: 3 /10   Post-procedure: 5 /10      Effectiveness:  Initial hour after procedure: 100 %  Subsequent 4-6 hours post-procedure: 100 %  Analgesia past initial 6 hours: 0 %  Ongoing improvement:  Analgesic:  0%    Laboratory Chemistry Profile   Renal Lab Results  Component Value Date   BUN 33 (H) 07/01/2022   CREATININE 1.07 07/01/2022   BCR 25 (H) 11/10/2019   GFR 51.30 (L) 07/01/2022   GFRAA 53 (L) 06/10/2020   GFRNONAA 50 (L) 12/07/2021     Hepatic Lab Results  Component Value Date   AST 13 07/01/2022   ALT 11 07/01/2022   ALBUMIN 4.2 07/01/2022   ALBUMIN 4.2 07/01/2022   ALKPHOS 82 07/01/2022   HCVAB NEGATIVE 06/12/2016    Electrolytes Lab Results  Component Value Date   NA 139 07/01/2022   K 4.2 07/01/2022   CL 101 07/01/2022   CALCIUM 9.6 07/01/2022   MG 2.2 12/03/2021   PHOS 3.8 07/01/2022    Bone No results found for: "VD25OH", "VD125OH2TOT", "[id number]", "[id number]", "25OHVITD1", "25OHVITD2", "25OHVITD3", "TESTOFREE", "TESTOSTERONE"  Inflammation (CRP: Acute Phase) (ESR: Chronic Phase) Lab Results  Component Value Date   CRP 0.7 11/10/2019   ESRSEDRATE 14 11/10/2019         Note: Above Lab results reviewed.   Assessment  The primary encounter diagnosis was Spinal stenosis, lumbar region, with neurogenic claudication. Diagnoses of Foraminal stenosis of thoracic region, Chronic radicular lumbar pain, and History of lumbar fusion (L3-L5) were also pertinent to this visit.  Plan of Care  Recommend repeating lumbar epidural steroid injection however utilizing a transforaminal approach to target the right L1 and L2 nerves.  Risk and benefits reviewed and patient would like to proceed. 1. Spinal stenosis, lumbar region, with neurogenic claudication  2. Foraminal stenosis of thoracic region - Lumbar Transforaminal Epidural; Future  3. Chronic radicular lumbar pain - Lumbar Transforaminal Epidural; Future  4. History of lumbar fusion (L3-L5) - Lumbar Transforaminal Epidural; Future    Orders:  Orders Placed This Encounter  Procedures   Lumbar Transforaminal Epidural    Standing Status:   Future  Standing Expiration Date:   02/10/2023    Scheduling Instructions:     Right L1, L2 TF ESI     PO Valium     Bonnell Placzek    Order Specific Question:   Where will this procedure be performed?    Answer:   [facility]   Follow-up plan:   Return in about 1 week (around 11/17/2022) for RIght L1 and L2  TF ESI, in clinic (PO Valium).      Left L1/2 ESI 10/15/22     Recent Visits Date Type Provider Dept  10/15/22 Procedure visit Gillis Santa, MD [facility]  09/25/22 Office Visit Gillis Santa, MD [facility]  Showing recent visits within past 90 days and meeting all other requirements Today's Visits Date Type Provider Dept  11/10/22 Office Visit Gillis Santa, MD [facility]  Showing today's visits and meeting all other requirements Future Appointments No visits were found meeting these conditions. Showing future appointments within next 90 days and meeting all other requirements  I discussed the assessment and treatment plan with the patient. The patient was provided an opportunity to ask questions and all were answered. The patient agreed with the plan and demonstrated an understanding of the instructions.  Patient advised to call back or seek an in-person evaluation if the symptoms or condition worsens.  Duration of encounter: 56mnutes.  Note by: BGillis Santa MD Date: 11/10/2022; Time: 3:33 PM

## 2022-11-14 ENCOUNTER — Ambulatory Visit: Payer: Self-pay | Admitting: *Deleted

## 2022-11-14 NOTE — Patient Outreach (Signed)
  Care Coordination   Follow Up Visit Note   11/14/2022 Name: [name] MRN: [id number] DOB: 08-25-1946  [name] is a 77 y.o. year old female who sees Venia Carbon, MD for primary care. I spoke with  [name] by phone today.  What matters to the patients health and wellness today?  In home care services    Goals Addressed             This Visit's Progress    "I need in home care services"       Care Coordination Interventions:  Request for independent assessment for personal care services form confirmed to be  received by [facility] and is ready for scheduling Contact information to [facility] provided to patient for return call as well [phone]           SDOH assessments and interventions completed:  No     Care Coordination Interventions:  Yes, provided  Interventions Today    Flowsheet Row Most Recent Value  Chronic Disease   Chronic disease during today's visit Diabetes, Chronic Obstructive Pulmonary Disease (COPD)  General Interventions   General Interventions Discussed/Reviewed General Interventions Reviewed, Community Resources, Level of Care  Level of [place]  [phone call to [facility]-confirmed that they had patient request form for an assessment for personal care services and is ready to schedule-collaboration phone call with patient on the phone -VM left with sched dept. requesting return call]       Follow up plan: Follow up call scheduled for 11/19/22    Encounter Outcome:  Pt. Visit Completed

## 2022-11-14 NOTE — Patient Instructions (Signed)
Visit Information  Thank you for taking time to visit with me today. Please don't hesitate to contact me if I can be of assistance to you.   Following are the goals we discussed today:   Goals Addressed             This Visit's Progress    "I need in home care services"       Care Coordination Interventions:  Request for independent assessment for personal care services form confirmed to be  received by [facility] and is ready for scheduling Contact information to [facility] provided to patient for return call as well [phone]           Our next appointment is by telephone on 11/19/22 at 11am  Please call the care guide team at [phone] if you need to cancel or reschedule your appointment.   If you are experiencing a Mental Health or [place] or need someone to talk to, please call [phone]   Patient verbalizes understanding of instructions and care plan provided today and agrees to view in [place]. Active MyChart status and patient understanding of how to access instructions and care plan via MyChart confirmed with patient.     Telephone follow up appointment with care management team member scheduled for: 11/19/22  Elliot Gurney, [place] Worker  [facility] Management [phone]

## 2022-11-15 ENCOUNTER — Other Ambulatory Visit: Payer: Self-pay | Admitting: Internal Medicine

## 2022-11-17 ENCOUNTER — Ambulatory Visit
Admission: RE | Admit: 2022-11-17 | Discharge: 2022-11-17 | Disposition: A | Discharge status: Home / Self Care | Payer: 59 | Source: Ambulatory Visit | Attending: Student in an Organized Health Care Education/Training Program | Admitting: Student in an Organized Health Care Education/Training Program

## 2022-11-17 ENCOUNTER — Ambulatory Visit
Payer: 59 | Attending: Student in an Organized Health Care Education/Training Program | Admitting: Student in an Organized Health Care Education/Training Program

## 2022-11-17 ENCOUNTER — Encounter: Payer: Self-pay | Admitting: Student in an Organized Health Care Education/Training Program

## 2022-11-17 DIAGNOSIS — M5416 Radiculopathy, lumbar region: Secondary | ICD-10-CM | POA: Diagnosis not present

## 2022-11-17 DIAGNOSIS — G8929 Other chronic pain: Secondary | ICD-10-CM

## 2022-11-17 DIAGNOSIS — Z981 Arthrodesis status: Secondary | ICD-10-CM | POA: Insufficient documentation

## 2022-11-17 DIAGNOSIS — M4804 Spinal stenosis, thoracic region: Secondary | ICD-10-CM

## 2022-11-17 MED ORDER — DIAZEPAM 5 MG PO TABS
ORAL_TABLET | ORAL | Status: AC
  Filled 2022-11-17: qty 1

## 2022-11-17 MED ORDER — DEXAMETHASONE SODIUM PHOSPHATE 10 MG/ML IJ SOLN
20.0000 mg | Freq: Once | INTRAMUSCULAR | Status: AC
  Administered 2022-11-17: 20 mg
  Filled 2022-11-17: qty 2

## 2022-11-17 MED ORDER — ROPIVACAINE HCL 2 MG/ML IJ SOLN
2.0000 mL | Freq: Once | INTRAMUSCULAR | Status: AC
  Administered 2022-11-17: 2 mL via EPIDURAL
  Filled 2022-11-17: qty 20

## 2022-11-17 MED ORDER — DIAZEPAM 5 MG PO TABS
5.0000 mg | ORAL_TABLET | ORAL | Status: AC
  Administered 2022-11-17: 5 mg via ORAL

## 2022-11-17 MED ORDER — LIDOCAINE HCL 2 % IJ SOLN
20.0000 mL | Freq: Once | INTRAMUSCULAR | Status: AC
  Administered 2022-11-17: 100 mg
  Filled 2022-11-17: qty 40

## 2022-11-17 MED ORDER — IOHEXOL 180 MG/ML  SOLN
10.0000 mL | Freq: Once | INTRAMUSCULAR | Status: AC
  Administered 2022-11-17: 10 mL via EPIDURAL
  Filled 2022-11-17: qty 20

## 2022-11-17 MED ORDER — DEXAMETHASONE SODIUM PHOSPHATE 10 MG/ML IJ SOLN
INTRAMUSCULAR | Status: AC
  Filled 2022-11-17: qty 1

## 2022-11-17 MED ORDER — SODIUM CHLORIDE 0.9% FLUSH
2.0000 mL | Freq: Once | INTRAVENOUS | Status: AC
  Administered 2022-11-17: 2 mL

## 2022-11-17 NOTE — Patient Instructions (Signed)
Pain Management Discharge Instructions  General Discharge Instructions :  If you need to reach your doctor call: Monday-Friday 8:00 am - 4:00 pm at [phone] or toll free [phone].  After clinic hours [phone] to have operator reach doctor.  Bring all of your medication bottles to all your appointments in the pain clinic.  To cancel or reschedule your appointment with Pain Management please remember to call 24 hours in advance to avoid a fee.  Refer to the educational materials which you have been given on: General Risks, I had my Procedure. Discharge Instructions, Post Sedation.  Post Procedure Instructions:  The drugs you were given will stay in your system until tomorrow, so for the next 24 hours you should not drive, make any legal decisions or drink any alcoholic beverages.  You may eat anything you prefer, but it is better to start with liquids then soups and crackers, and gradually work up to solid foods.  Please notify your doctor immediately if you have any unusual bleeding, trouble breathing or pain that is not related to your normal pain.  Depending on the type of procedure that was done, some parts of your body may feel week and/or numb.  This usually clears up by tonight or the next day.  Walk with the use of an assistive device or accompanied by an adult for the 24 hours.  You may use ice on the affected area for the first 24 hours.  Put ice in a Ziploc bag and cover with a towel and place against area 15 minutes on 15 minutes off.  You may switch to heat after 24 hours.Selective Nerve Root Block Patient Information  Description: Specific nerve roots exit the spinal canal and these nerves can be compressed and inflamed by a bulging disc and bone spurs.  By injecting steroids on the nerve root, we can potentially decrease the inflammation surrounding these nerves, which often leads to decreased pain.  Also, by injecting local anesthesia on the nerve root, this can  provide us helpful information to give to your referring doctor if it decreases your pain.  Selective nerve root blocks can be done along the spine from the neck to the low back depending on the location of your pain.   After numbing the skin with local anesthesia, a small needle is passed to the nerve root and the position of the needle is verified using x-ray pictures.  After the needle is in correct position, we then deposit the medication.  You may experience a pressure sensation while this is being done.  The entire block usually lasts less than 15 minutes.  Conditions that may be treated with selective nerve root blocks: Low back and leg pain Spinal stenosis Diagnostic block prior to potential surgery Neck and arm pain Post laminectomy syndrome  Preparation for the injection:  Do not eat any solid food or dairy products within 8 hours of your appointment. You may drink clear liquids up to 3 hours before an appointment.  Clear liquids include water, black coffee, juice or soda.  No milk or cream please. You may take your regular medications, including pain medications, with a sip of water before your appointment.  Diabetics should hold regular insulin (if taken separately) and take 1/2 normal NPH dose the morning of the procedure.  Carry some sugar containing items with you to your appointment. A driver must accompany you and be prepared to drive you home after your procedure. Bring all your current medications with you. An IV   may be inserted and sedation may be given at the discretion of the physician. A blood pressure cuff, EKG, and other monitors will often be applied during the procedure.  Some patients may need to have extra oxygen administered for a short period. You will be asked to provide medical information, including allergies, prior to the procedure.  We must know immediately if you are taking blood  Thinners (like Coumadin) or if you are allergic to IV iodine contrast  (dye).  Possible side-effects: All are usually temporary Bleeding from needle site Light headedness Numbness and tingling Decreased blood pressure Weakness in arms/legs Pressure sensation in back/neck Pain at injection site (several days)  Possible complications: All are extremely rare Infection Nerve injury Spinal headache (a headache wore with upright position)  Call if you experience: Fever/chills associated with headache or increased back/neck pain Headache worsened by an upright position New onset weakness or numbness of an extremity below the injection site Hives or difficulty breathing (go to the emergency room) Inflammation or drainage at the injection site(s) Severe back/neck pain greater than usual New symptoms which are concerning to you  Please note:  Although the local anesthetic injected can often make your back or neck feel good for several hours after the injection the pain will likely return.  It takes 3-5 days for steroids to work on the nerve root. You may not notice any pain relief for at least one week.  If effective, we will often do a series of 3 injections spaced 3-6 weeks apart to maximally decrease your pain.    If you have any questions, please call [phone] [facility] 

## 2022-11-17 NOTE — Telephone Encounter (Signed)
Last filled 10-01-22 #540 mail order Last OV 10-01-22 Next OV 12-31-22 [facility]

## 2022-11-17 NOTE — Progress Notes (Signed)
Safety precautions to be maintained throughout the outpatient stay will include: orient to surroundings, keep bed in low position, maintain call bell within reach at all times, provide assistance with transfer out of bed and ambulation.   Before administering 5 mg valium, I confirmed with [name] that patient would be able take an [employer] home alone without a personal escort.

## 2022-11-17 NOTE — Progress Notes (Signed)
PROVIDER NOTE: Interpretation of information contained herein should be left to medically-trained personnel. Specific patient instructions are provided elsewhere under "Patient Instructions" section of medical record. This document was created in part using STT-dictation technology, any transcriptional errors that may result from this process are unintentional.  Patient: [name] Type: Established DOB: 02-06-1946 MRN: [id number] PCP: Venia Carbon, MD  Service: Procedure DOS: 11/17/2022 Setting: Ambulatory Location: Ambulatory outpatient facility Delivery: Face-to-face Provider: Gillis Santa, MD Specialty: Interventional Pain Management Specialty designation: 09 Location: Outpatient facility Ref. Prov.: Gillis Santa, MD       Interventional Therapy   Procedure: Lumbar trans-foraminal epidural steroid injection (L-TFESI) #1  Laterality: Right (-RT)  Level: L1 and L2 nerve root(s) Imaging: Fluoroscopy-guided         Anesthesia: Local anesthesia (1-2% Lidocaine) Anxiolysis: Valium 5 mg PO Performed by: Gillis Santa, MD  Purpose: Diagnostic/Therapeutic Indications: Lumbar radicular pain severe enough to impact quality of life or function. 1. Chronic radicular lumbar pain   2. History of lumbar fusion (L3-L5)   3. Foraminal stenosis of thoracic region    NAS-11 Pain score:   Pre-procedure: 3 /10   Post-procedure: 3 /10     Position / Prep / Materials:  Position: Prone  Prep solution: DuraPrep (Iodine Povacrylex [0.7% available iodine] and Isopropyl Alcohol, 74% w/w) Prep Area: Entire Posterior Lumbosacral Area.  From the lower tip of the scapula down to the tailbone and from flank to flank. Materials:  Tray: Block Needle(s):  Type: Spinal  Gauge (G): 22  Length: 3.5-in  Qty: 2      Pre-op H&P Assessment:  [name] is a 77 y.o. (year old), female patient, seen today for interventional treatment. She  has a past surgical history that includes Cesarean section  (1978); Hemorrhoid surgery (2015); Colonoscopy; Transforaminal lumbar interbody fusion (tlif) with pedicle screw fixation 2 level (N/A, 11/07/2015); Joint replacement; Total hip arthroplasty (Left, 01/08/2018); Back surgery; and PACEMAKER LEADLESS INSERTION (N/A, 12/06/2021). [name] has a current medication list which includes the following prescription(s): accu-chek softclix lancets, aripiprazole, aspirin ec, atorvastatin, donepezil, duloxetine, gabapentin, lisinopril-hydrochlorothiazide, multivitamin with minerals, onetouch verio, tramadol, and amlodipine. Her primarily concern today is the Hip Pain (right)  Initial Vital Signs:  Pulse/HCG Rate: 79ECG Heart Rate: 77 Temp: (!) 97 F (36.1 C) Resp: 17 BP: 132/81 SpO2: 96 %  BMI: Estimated body mass index is 35.9 kg/m as calculated from the following:   Height as of this encounter: '5\' 1"'$  (1.549 m).   Weight as of this encounter: 190 lb (86.2 kg).  Risk Assessment: Allergies: Reviewed. She has No Known Allergies.  Allergy Precautions: None required Coagulopathies: Reviewed. None identified.  Blood-thinner therapy: None at this time Active Infection(s): Reviewed. None identified. [name] is afebrile  Site Confirmation: [name] was asked to confirm the procedure and laterality before marking the site Procedure checklist: Completed Consent: Before the procedure and under the influence of no sedative(s), amnesic(s), or anxiolytics, the patient was informed of the treatment options, risks and possible complications. To fulfill our ethical and legal obligations, as recommended by the American Medical Association's Code of Ethics, I have informed the patient of my clinical impression; the nature and purpose of the treatment or procedure; the risks, benefits, and possible complications of the intervention; the alternatives, including doing nothing; the risk(s) and benefit(s) of the alternative treatment(s) or procedure(s); and the  risk(s) and benefit(s) of doing nothing. The patient was provided information about the general risks and possible complications associated with  the procedure. These may include, but are not limited to: failure to achieve desired goals, infection, bleeding, organ or nerve damage, allergic reactions, paralysis, and death. In addition, the patient was informed of those risks and complications associated to Spine-related procedures, such as failure to decrease pain; infection (i.e.: Meningitis, epidural or intraspinal abscess); bleeding (i.e.: epidural hematoma, subarachnoid hemorrhage, or any other type of intraspinal or peri-dural bleeding); organ or nerve damage (i.e.: Any type of peripheral nerve, nerve root, or spinal cord injury) with subsequent damage to sensory, motor, and/or autonomic systems, resulting in permanent pain, numbness, and/or weakness of one or several areas of the body; allergic reactions; (i.e.: anaphylactic reaction); and/or death. Furthermore, the patient was informed of those risks and complications associated with the medications. These include, but are not limited to: allergic reactions (i.e.: anaphylactic or anaphylactoid reaction(s)); adrenal axis suppression; blood sugar elevation that in diabetics may result in ketoacidosis or comma; water retention that in patients with history of congestive heart failure may result in shortness of breath, pulmonary edema, and decompensation with resultant heart failure; weight gain; swelling or edema; medication-induced neural toxicity; particulate matter embolism and blood vessel occlusion with resultant organ, and/or nervous system infarction; and/or aseptic necrosis of one or more joints. Finally, the patient was informed that Medicine is not an exact science; therefore, there is also the possibility of unforeseen or unpredictable risks and/or possible complications that may result in a catastrophic outcome. The patient indicated having  understood very clearly. We have given the patient no guarantees and we have made no promises. Enough time was given to the patient to ask questions, all of which were answered to the patient's satisfaction. Ms. Guba has indicated that she wanted to continue with the procedure. Attestation: I, the ordering provider, attest that I have discussed with the patient the benefits, risks, side-effects, alternatives, likelihood of achieving goals, and potential problems during recovery for the procedure that I have provided informed consent. Date  Time: 11/17/2022  9:20 AM   Pre-Procedure Preparation:  Monitoring: As per clinic protocol. Respiration, ETCO2, SpO2, BP, heart rate and rhythm monitor placed and checked for adequate function Safety Precautions: Patient was assessed for positional comfort and pressure points before starting the procedure. Time-out: I initiated and conducted the "Time-out" before starting the procedure, as per protocol. The patient was asked to participate by confirming the accuracy of the "Time Out" information. Verification of the correct person, site, and procedure were performed and confirmed by me, the nursing staff, and the patient. "Time-out" conducted as per Joint Commission's Universal Protocol (UP.01.01.01). Time: 1013  Description/Narrative of Procedure:          Target: The 6 o'clock position under the pedicle, on the affected side. Region: Posterolateral Lumbosacral Approach: Posterior Percutaneous Paravertebral approach.  Rationale (medical necessity): procedure needed and proper for the diagnosis and/or treatment of the patient's medical symptoms and needs. Procedural Technique Safety Precautions: Aspiration looking for blood return was conducted prior to all injections. At no point did we inject any substances, as a needle was being advanced. No attempts were made at seeking any paresthesias. Safe injection practices and needle disposal techniques used.  Medications properly checked for expiration dates. SDV (single dose vial) medications used. Description of the Procedure: Protocol guidelines were followed. The patient was placed in position over the procedure table. The target area was identified and the area prepped in the usual manner. Skin & deeper tissues infiltrated with local anesthetic. Appropriate amount of time allowed to pass for  local anesthetics to take effect. The procedure needles were then advanced to the target area. Proper needle placement secured. Negative aspiration confirmed. Solution injected in intermittent fashion, asking for systemic symptoms every 0.5cc of injectate. The needles were then removed and the area cleansed, making sure to leave some of the prepping solution back to take advantage of its long term bactericidal properties.  4 cc solution made of 1.5 cc of preservative-free saline, 1.5 cc of 0.2% ropivacaine, 2 cc of Decadron 10 mg/cc. 2.5 cc injected at each level on the right    Vitals:   11/17/22 0935 11/17/22 1011 11/17/22 1016 11/17/22 1018  BP: 132/81 (!) 150/87 (!) 129/91 126/87  Pulse: 79     Resp: '17 18 16 18  '$ Temp: (!) 97 F (36.1 C)     TempSrc: Temporal     SpO2: 96% 95% 95% 100%  Weight: 190 lb (86.2 kg)     Height: '5\' 1"'$  (1.549 m)       Start Time: 1013 hrs. End Time: 1018 hrs.  Imaging Guidance (Spinal):          Type of Imaging Technique: Fluoroscopy Guidance (Spinal) Indication(s): Assistance in needle guidance and placement for procedures requiring needle placement in or near specific anatomical locations not easily accessible without such assistance. Exposure Time: Please see nurses notes. Contrast: Before injecting any contrast, we confirmed that the patient did not have an allergy to iodine, shellfish, or radiological contrast. Once satisfactory needle placement was completed at the desired level, radiological contrast was injected. Contrast injected under live fluoroscopy. No  contrast complications. See chart for type and volume of contrast used. Fluoroscopic Guidance: I was personally present during the use of fluoroscopy. "Tunnel Vision Technique" used to obtain the best possible view of the target area. Parallax error corrected before commencing the procedure. "Direction-depth-direction" technique used to introduce the needle under continuous pulsed fluoroscopy. Once target was reached, antero-posterior, oblique, and lateral fluoroscopic projection used confirm needle placement in all planes. Images permanently stored in EMR. Interpretation: I personally interpreted the imaging intraoperatively. Adequate needle placement confirmed in multiple planes. Appropriate spread of contrast into desired area was observed. No evidence of afferent or efferent intravascular uptake. No intrathecal or subarachnoid spread observed. Permanent images saved into the patient's record.  Post-operative Assessment:  Post-procedure Vital Signs:  Pulse/HCG Rate: 7978 Temp: (!) 97 F (36.1 C) Resp: 18 BP: 126/87 SpO2: 100 %  EBL: None  Complications: No immediate post-treatment complications observed by team, or reported by patient.  Note: The patient tolerated the entire procedure well. A repeat set of vitals were taken after the procedure and the patient was kept under observation following institutional policy, for this type of procedure. Post-procedural neurological assessment was performed, showing return to baseline, prior to discharge. The patient was provided with post-procedure discharge instructions, including a section on how to identify potential problems. Should any problems arise concerning this procedure, the patient was given instructions to immediately contact us, at any time, without hesitation. In any case, we plan to contact the patient by telephone for a follow-up status report regarding this interventional procedure.  Comments:  No additional relevant  information.  Plan of Care (POC)  Orders:  Orders Placed This Encounter  Procedures   DG PAIN CLINIC C-ARM 1-60 MIN NO REPORT    Intraoperative interpretation by procedural physician at [place].    Standing Status:   Standing    Number of Occurrences:   1    Order  Specific Question:   Reason for exam:    Answer:   Assistance in needle guidance and placement for procedures requiring needle placement in or near specific anatomical locations not easily accessible without such assistance.     Medications ordered for procedure: Meds ordered this encounter  Medications   iohexol (OMNIPAQUE) 180 MG/ML injection 10 mL    Must be Myelogram-compatible. If not available, you may substitute with a water-soluble, non-ionic, hypoallergenic, myelogram-compatible radiological contrast medium.   lidocaine (XYLOCAINE) 2 % (with pres) injection 400 mg   diazepam (VALIUM) tablet 5 mg    Make sure Flumazenil is available in the pyxis when using this medication. If oversedation occurs, administer 0.2 mg IV over 15 sec. If after 45 sec no response, administer 0.2 mg again over 1 min; may repeat at 1 min intervals; not to exceed 4 doses (1 mg)   sodium chloride flush (NS) 0.9 % injection 2 mL    This is for a two (2) level block. Use two (2) syringes and divide content in half.   ropivacaine (PF) 2 mg/mL (0.2%) (NAROPIN) injection 2 mL    This is for a two (2) level block. Use two (2) syringes and divide content in half.   dexamethasone (DECADRON) injection 20 mg    This is for a two (2) level block. Use two (2) syringes and divide content in half.   Medications administered: We administered iohexol, lidocaine, diazepam, sodium chloride flush, ropivacaine (PF) 2 mg/mL (0.2%), and dexamethasone.  See the medical record for exact dosing, route, and time of administration.  Follow-up plan:   Return in about 4 weeks (around 12/15/2022) for VV PPE.       Left L1/2 ESI 10/15/22, Right L1, L2 TF  ESI 11/17/22      Recent Visits Date Type Provider Dept  11/10/22 Office Visit Gillis Santa, MD [facility]  10/15/22 Procedure visit Gillis Santa, MD [facility]  09/25/22 Office Visit Gillis Santa, MD [facility]  Showing recent visits within past 90 days and meeting all other requirements Today's Visits Date Type Provider Dept  11/17/22 Procedure visit Gillis Santa, MD [facility]  Showing today's visits and meeting all other requirements Future Appointments Date Type Provider Dept  12/15/22 Appointment Gillis Santa, MD [facility]  Showing future appointments within next 90 days and meeting all other requirements  Disposition: Discharge home  Discharge (Date  Time): 11/17/2022; 1030 hrs.   Primary Care Physician: Venia Carbon, MD Location: [facility] Note by: Gillis Santa, MD (TTS technology used. I apologize for any typographical errors that were not detected and corrected.) Date: 11/17/2022; Time: 11:28 AM  Disclaimer:  Medicine is not an Chief Strategy Officer. The only guarantee in medicine is that nothing is guaranteed. It is important to note that the decision to proceed with this intervention was based on the information collected from the patient. The Data and conclusions were drawn from the patient's questionnaire, the interview, and the physical examination. Because the information was provided in large part by the patient, it cannot be guaranteed that it has not been purposely or unconsciously manipulated. Every effort has been made to obtain as much relevant data as possible for this evaluation. It is important to note that the conclusions that lead to this procedure are derived in large part from the available data. Always take into account that the treatment will also be dependent on availability of resources and existing treatment guidelines, considered  by other Pain Management Practitioners  as being common knowledge and practice, at the time of the intervention. For Medico-Legal purposes, it is also important to point out that variation in procedural techniques and pharmacological choices are the acceptable norm. The indications, contraindications, technique, and results of the above procedure should only be interpreted and judged by a Board-Certified Interventional Pain Specialist with extensive familiarity and expertise in the same exact procedure and technique.

## 2022-11-18 ENCOUNTER — Other Ambulatory Visit: Payer: Self-pay | Admitting: Internal Medicine

## 2022-11-18 ENCOUNTER — Telehealth: Payer: Self-pay

## 2022-11-18 MED ORDER — TRAMADOL HCL 50 MG PO TABS: 50.0000 mg | ORAL_TABLET | Freq: Three times a day (TID) | ORAL | 0 refills | Status: DC | PRN

## 2022-11-18 NOTE — Telephone Encounter (Signed)
Pt states she spoke to [facility] and they do not show rxs for her. Her [facility]. She will be out of medication tomorrow.

## 2022-11-18 NOTE — Addendum Note (Signed)
Addended by: Viviana Simpler I on: 11/18/2022 11:01 AM   Modules accepted: Orders

## 2022-11-18 NOTE — Telephone Encounter (Signed)
The 3 month Rx did not show up on PDMP. I did send 1 month supply locally

## 2022-11-18 NOTE — Telephone Encounter (Signed)
Post procedure follow up.  Patient states she is doing ok.  

## 2022-11-18 NOTE — Telephone Encounter (Signed)
Prescription Request  11/18/2022  LOV: 10/01/2022  What is the name of the medication or equipment? traMADol (ULTRAM) 50 MG tablet   Have you contacted your pharmacy to request a refill? No   Which pharmacy would you like this sent to?  [name] [address] [place] [address]Phone: [phone]: [phone]    Patient notified that their request is being sent to the clinical staff for review and that they should receive a response within 2 business days.   Please advise at Mobile [phone](mobile)

## 2022-11-18 NOTE — Telephone Encounter (Signed)
Refill Tramadol Last office visit 10/01/22 Last refill 10/01/22 #540

## 2022-11-18 NOTE — Addendum Note (Signed)
Addended by: Pilar Grammes on: 11/18/2022 09:57 AM   Modules accepted: Orders

## 2022-11-18 NOTE — Telephone Encounter (Signed)
Called and spoke to pt. A rx was sent to [facility] 10-01-22 #540. It is too soon. Spoke to pt. She stated she does not remember getting it from mail order.  She is calling [facility]

## 2022-11-19 ENCOUNTER — Ambulatory Visit: Payer: Self-pay | Admitting: *Deleted

## 2022-11-19 DIAGNOSIS — Z950 Presence of cardiac pacemaker: Secondary | ICD-10-CM | POA: Diagnosis not present

## 2022-11-19 DIAGNOSIS — I452 Bifascicular block: Secondary | ICD-10-CM | POA: Diagnosis not present

## 2022-11-19 NOTE — Patient Outreach (Signed)
  Care Coordination   Follow Up Visit Note   11/19/2022 Name: [name] MRN: [id number] DOB: 10/19/1945  [name] is a 77 y.o. year old female who sees Venia Carbon, MD for primary care. I spoke with  [name] by phone today.  What matters to the patients health and wellness today?  In home care services    Goals Addressed             This Visit's Progress    "I need in home care services"       Activities and task to complete in order to accomplish goals.   Call [facility] to follow up on appointment for the initial assessment for personal care services [phone]          SDOH assessments and interventions completed:  No     Care Coordination Interventions:  Yes, provided  Interventions Today    Flowsheet Row Most Recent Value  Chronic Disease   Chronic disease during today's visit Diabetes, Chronic Obstructive Pulmonary Disease (COPD)  General Interventions   General Interventions Discussed/Reviewed General Interventions Discussed, Community Resources, Level of Care  Level of [place]  [Confirmed that patient received a call from [place] to schedule the initial assessment for personal care services, however patient was not able to secure appointment due to illness]       Follow up plan: Follow up call scheduled for 11/26/22    Encounter Outcome:  Pt. Visit Completed

## 2022-11-19 NOTE — Patient Instructions (Signed)
Visit Information  Thank you for taking time to visit with me today. Please don't hesitate to contact me if I can be of assistance to you.   Following are the goals we discussed today:   Goals Addressed             This Visit's Progress    "I need in home care services"       Activities and task to complete in order to accomplish goals.   Call [facility] to follow up on appointment for the initial assessment for personal care services [phone]          Our next appointment is by telephone on 11/26/22 at 11am  Please call the care guide team at [phone] if you need to cancel or reschedule your appointment.   If you are experiencing a Mental Health or [place] or need someone to talk to, please call 911   Patient verbalizes understanding of instructions and care plan provided today and agrees to view in [place]. Active MyChart status and patient understanding of how to access instructions and care plan via MyChart confirmed with patient.     Telephone follow up appointment with care management team member scheduled for: 11/26/22  Elliot Gurney, [place] Worker  [facility] Care Management [phone]  \

## 2022-11-24 DIAGNOSIS — J340 Abscess, furuncle and carbuncle of nose: Secondary | ICD-10-CM | POA: Diagnosis not present

## 2022-11-24 DIAGNOSIS — R040 Epistaxis: Secondary | ICD-10-CM | POA: Diagnosis not present

## 2022-11-25 ENCOUNTER — Other Ambulatory Visit: Payer: Self-pay | Admitting: Internal Medicine

## 2022-11-25 ENCOUNTER — Encounter: Payer: Self-pay | Admitting: Internal Medicine

## 2022-11-26 ENCOUNTER — Ambulatory Visit: Payer: Self-pay | Admitting: *Deleted

## 2022-11-26 NOTE — Patient Instructions (Signed)
Visit Information  Thank you for taking time to visit with me today. Please don't hesitate to contact me if I can be of assistance to you.   Following are the goals we discussed today:   Goals Addressed             This Visit's Progress    "I need in home care services"       Activities and task to complete in order to accomplish goals.   [facility] contacted patient and scheduled initial assessment for personal care services on 12/08/22 Patient will request neighbor to assist with care needs if needed in the absence of her son Confirmed that upon approval for personal care services, patient will need to select an agency to [place] to be reviewed once given approval           Our next appointment is by telephone on 12/09/22 at 11am  Please call the care guide team at [phone] if you need to cancel or reschedule your appointment.   If you are experiencing a Mental Health or [place] or need someone to talk to, please call 911   Patient verbalizes understanding of instructions and care plan provided today and agrees to view in [place]. Active MyChart status and patient understanding of how to access instructions and care plan via MyChart confirmed with patient.     Telephone follow up appointment with care management team member scheduled for: 12/09/22  Elliot Gurney, [place] Worker  [facility] Care Management [phone]

## 2022-11-26 NOTE — Patient Outreach (Signed)
  Care Coordination   Follow Up Visit Note   11/26/2022 Name: [name] MRN: [id number] DOB: 07-Mar-1946  [name] is a 77 y.o. year old female who sees Venia Carbon, MD for primary care. I spoke with  [name] by phone today.  What matters to the patients health and wellness today?  In home care services    Goals Addressed             This Visit's Progress    "I need in home care services"       Activities and task to complete in order to accomplish goals.   [facility] contacted patient and scheduled initial assessment for personal care services on 12/08/22 Patient will request neighbor to assist with care needs if needed in the absence of her son Confirmed that upon approval for personal care services, patient will need to select an agency to [place] to be reviewed once given approval           SDOH assessments and interventions completed:  No     Care Coordination Interventions:  Yes, provided  Interventions Today    Flowsheet Row Most Recent Value  Chronic Disease   Chronic disease during today's visit Diabetes, Chronic Obstructive Pulmonary Disease (COPD)  General Interventions   General Interventions Discussed/Reviewed General Interventions Reviewed, Level of Care  Level of [place]  [patient confirms that she has an assessment with [facility] scheduled for 12/08/22 for personal care services]       Follow up plan: Follow up call scheduled for 12/09/22    Encounter Outcome:  Pt. Visit Completed

## 2022-12-04 ENCOUNTER — Other Ambulatory Visit: Payer: Self-pay | Admitting: Internal Medicine

## 2022-12-09 ENCOUNTER — Encounter: Payer: Self-pay | Admitting: Student in an Organized Health Care Education/Training Program

## 2022-12-09 ENCOUNTER — Ambulatory Visit: Payer: Self-pay | Admitting: *Deleted

## 2022-12-09 NOTE — Patient Instructions (Addendum)
Visit Information  Thank you for taking time to visit with me today. Please don't hesitate to contact me if I can be of assistance to you.   Following are the goals we discussed today:   Goals Addressed             This Visit's Progress    "I need in home care services"       Activities and task to complete in order to accomplish goals.   Patient assessment for personal care services completed on 12/08/22 Confirmed that patient  has a  neighbor that can assist with some of her care needs  in the absence of her son and while private duty aid is being coordinated  Patient has selected a private duty  agency to [employer] to start once approval is received by mail          Our next appointment is by telephone on 12/23/22 at 11am  Please call the care guide team at [phone] if you need to cancel or reschedule your appointment.   If you are experiencing a Mental Health or [place] or need someone to talk to, please call 911   Patient verbalizes understanding of instructions and care plan provided today and agrees to view in [place]. Active MyChart status and patient understanding of how to access instructions and care plan via MyChart confirmed with patient.     Telephone follow up appointment with care management team member scheduled for: 12/23/22  Elliot Gurney, [place] Worker  [facility] Care Management [phone]

## 2022-12-09 NOTE — Patient Outreach (Signed)
  Care Coordination   Follow Up Visit Note   12/09/2022 Name: BIBIAN MUSICH MRN: [id number] DOB: 11/20/1945  AKYLAH WOJAHN is a 77 y.o. year old female who sees Venia Carbon, MD for primary care. I spoke with  [name] by phone today.  What matters to the patients health and wellness today?  In home care services    Goals Addressed             This Visit's Progress    "I need in home care services"       Activities and task to complete in order to accomplish goals.   [facility] contacted patient assessment for personal care services completed on 12/08/22 Patient has a  neighbor that has been  assist with care needs  in the absence of her son and private duty aid  Patient has select an agency to utilize-services to start once approval is received by mail          SDOH assessments and interventions completed:  No     Care Coordination Interventions:  Yes, provided  Interventions Today    Flowsheet Row Most Recent Value  Chronic Disease   Chronic disease during today's visit Diabetes, Chronic Obstructive Pulmonary Disease (COPD)  General Interventions   General Interventions Discussed/Reviewed General Interventions Reviewed, Level of Care  Level of [place] completed by [facility] for private duty care on 12/08/22-letter statring final approval to be mailed to patient's home within 1 week]       Follow up plan: Follow up call scheduled for 12/22/21    Encounter Outcome:  Pt. Visit Completed

## 2022-12-10 ENCOUNTER — Ambulatory Visit
Payer: 59 | Attending: Student in an Organized Health Care Education/Training Program | Admitting: Student in an Organized Health Care Education/Training Program

## 2022-12-10 DIAGNOSIS — M1611 Unilateral primary osteoarthritis, right hip: Secondary | ICD-10-CM

## 2022-12-10 DIAGNOSIS — Z981 Arthrodesis status: Secondary | ICD-10-CM

## 2022-12-10 DIAGNOSIS — M533 Sacrococcygeal disorders, not elsewhere classified: Secondary | ICD-10-CM

## 2022-12-10 DIAGNOSIS — G8929 Other chronic pain: Secondary | ICD-10-CM | POA: Diagnosis not present

## 2022-12-10 DIAGNOSIS — M25551 Pain in right hip: Secondary | ICD-10-CM

## 2022-12-10 DIAGNOSIS — M5416 Radiculopathy, lumbar region: Secondary | ICD-10-CM

## 2022-12-10 DIAGNOSIS — M48062 Spinal stenosis, lumbar region with neurogenic claudication: Secondary | ICD-10-CM

## 2022-12-10 DIAGNOSIS — M4804 Spinal stenosis, thoracic region: Secondary | ICD-10-CM | POA: Diagnosis not present

## 2022-12-10 NOTE — Progress Notes (Signed)
Patient: [name]  Service Category: E/M  Provider: Gillis Santa, MD  DOB: [birth year]  DOS: 12/10/2022  Location: Office  MRN: [id number]  Setting: Ambulatory outpatient  Referring Provider: Venia Carbon, MD  Type: Established Patient  Specialty: Interventional Pain Management  PCP: Venia Carbon, MD  Location: Remote location  Delivery: TeleHealth     Virtual Encounter - Pain Management PROVIDER NOTE: Information contained herein reflects review and annotations entered in association with encounter. Interpretation of such information and data should be left to medically-trained personnel. Information provided to patient can be located elsewhere in the medical record under "Patient Instructions". Document created using STT-dictation technology, any transcriptional errors that may result from process are unintentional.    Contact & Pharmacy Preferred: [phone] Home: [phone] (home) Mobile: [phone] (mobile) E-mail: [email or web address]  [id number] [name] [place] [address] Phone: [phone] Fax: [phone]   Pre-screening  [name] offered "in-person" vs "virtual" encounter. She indicated preferring virtual for this encounter.   Reason COVID-19*  Social distancing based on CDC and AMA recommendations.   I contacted DOROTHIE SMYLY on 12/10/2022 via telephone.      I clearly identified myself as Gillis Santa, MD. I verified that I was speaking with the correct person using two identifiers (Name: [name], and date of birth: 1946/08/01).  Consent I sought verbal advanced consent from [name] for virtual visit interactions. I informed [name] of possible security and privacy concerns, risks, and limitations associated with providing "not-in-person" medical evaluation and management services. I also informed [name] of the availability of "in-person" appointments. Finally, I  informed her that there would be a charge for the virtual visit and that she could be  personally, fully or partially, financially responsible for it. [name] expressed understanding and agreed to proceed.   Historic Elements   [name] is a 77 y.o. year old, female patient evaluated today after our last contact on 11/17/2022. [name]  has a past medical history of Arthritis, Confusion, Diabetes mellitus without complication, Hepatitis, Hypertension, Lumbar stenosis, Memory loss, Nocturia, Numbness and tingling, and Wears glasses. She also  has a past surgical history that includes Cesarean section (1978); Hemorrhoid surgery (2015); Colonoscopy; Transforaminal lumbar interbody fusion (tlif) with pedicle screw fixation 2 level (N/A, 11/07/2015); Joint replacement; Total hip arthroplasty (Left, 01/08/2018); Back surgery; and PACEMAKER LEADLESS INSERTION (N/A, 12/06/2021). [name] has a current medication list which includes the following prescription(s): accu-chek softclix lancets, aripiprazole, atorvastatin, donepezil, duloxetine, gabapentin, lisinopril-hydrochlorothiazide, multivitamin with minerals, onetouch verio, tramadol, and amlodipine. She  reports that she has never smoked. She has never been exposed to tobacco smoke. She has never used smokeless tobacco. She reports that she does not drink alcohol and does not use drugs. [name] has No Known Allergies.  BMI: Estimated body mass index is 35.9 kg/m as calculated from the following:   Height as of 11/17/22: 5\' 1"  (1.549 m).   Weight as of 11/17/22: 190 lb (86.2 kg). Last encounter: 11/10/2022. Last procedure: 11/17/2022.  HPI  Today, she is being contacted for a post-procedure assessment.   Post-procedure evaluation   Procedure: Lumbar trans-foraminal epidural steroid injection (L-TFESI) #1  Laterality: Right (-RT)  Level: L1 and L2 nerve root(s) Imaging: Fluoroscopy-guided         Anesthesia: Local anesthesia (1-2%  Lidocaine) Anxiolysis: Valium 5 mg PO Performed by: Gillis Santa, MD  Purpose: Diagnostic/Therapeutic Indications: Lumbar radicular pain  severe enough to impact quality of life or function. 1. Chronic radicular lumbar pain   2. History of lumbar fusion (L3-L5)   3. Foraminal stenosis of thoracic region    NAS-11 Pain score:   Pre-procedure: 3 /10   Post-procedure: 3 /10      Effectiveness:  Initial hour after procedure: 70% Subsequent 4-6 hours post-procedure: 75% Analgesia past initial 6 hours: 75% Ongoing improvement:  Analgesic:  75% Function: Somewhat improved ROM: Somewhat improved   Laboratory Chemistry Profile   Renal Lab Results  Component Value Date   BUN 33 (H) 07/01/2022   CREATININE 1.07 07/01/2022   BCR 25 (H) 11/10/2019   GFR 51.30 (L) 07/01/2022   GFRAA 53 (L) 06/10/2020   GFRNONAA 50 (L) 12/07/2021    Hepatic Lab Results  Component Value Date   AST 13 07/01/2022   ALT 11 07/01/2022   ALBUMIN 4.2 07/01/2022   ALBUMIN 4.2 07/01/2022   ALKPHOS 82 07/01/2022   HCVAB NEGATIVE 06/12/2016    Electrolytes Lab Results  Component Value Date   NA 139 07/01/2022   K 4.2 07/01/2022   CL 101 07/01/2022   CALCIUM 9.6 07/01/2022   MG 2.2 12/03/2021   PHOS 3.8 07/01/2022    Bone No results found for: "VD25OH", "VD125OH2TOT", "[id number]", "[id number]", "25OHVITD1", "25OHVITD2", "25OHVITD3", "TESTOFREE", "TESTOSTERONE"  Inflammation (CRP: Acute Phase) (ESR: Chronic Phase) Lab Results  Component Value Date   CRP 0.7 11/10/2019   ESRSEDRATE 14 11/10/2019         Note: Above Lab results reviewed.   Right hip x-ray shows joint space narrowing and osteoarthritic changes  Assessment  The primary encounter diagnosis was Right hip pain. Diagnoses of Primary osteoarthritis of right hip, Chronic radicular lumbar pain, History of lumbar fusion (L3-L5), Foraminal stenosis of thoracic region, Spinal stenosis, lumbar region, with neurogenic claudication, and  Chronic SI joint pain were also pertinent to this visit.  Plan of Care  Patient states that her right leg pain is doing after her right L1-L2 transforaminal epidural steroid injection.  She is endorsing increased right hip pain that is worse with weightbearing.  She has a history of left hip replacement.  We discussed a right hip intra-articular steroid injection and patient is interested in proceeding with that.  Orders Placed This Encounter  Procedures   HIP INJECTION    Standing Status:   Future    Standing Expiration Date:   03/11/2023    Scheduling Instructions:     Right hip joint injection      Follow-up plan:   Return in about 4 weeks (around 01/07/2023).      Left L1/2 ESI 10/15/22, Right L1, L2 TF ESI 11/17/22       Recent Visits Date Type Provider Dept  11/17/22 Procedure visit Gillis Santa, MD [facility]  11/10/22 Office Visit Gillis Santa, MD [facility]  10/15/22 Procedure visit Gillis Santa, MD [facility]  09/25/22 Office Visit Gillis Santa, MD [facility]  Showing recent visits within past 90 days and meeting all other requirements Today's Visits Date Type Provider Dept  12/10/22 Office Visit Gillis Santa, MD [facility]  Showing today's visits and meeting all other requirements Future Appointments No visits were found meeting these conditions. Showing future appointments within next 90 days and meeting all other requirements  I discussed the assessment and treatment plan with the patient. The patient was provided an opportunity to ask questions and all were answered. The patient agreed with the  plan and demonstrated an understanding of the instructions.  Patient advised to call back or seek an in-person evaluation if the symptoms or condition worsens.  Duration of encounter: 30 minutes.  Note by: Gillis Santa, MD Date: 12/10/2022; Time: 12:58 PM

## 2022-12-13 ENCOUNTER — Encounter: Payer: Self-pay | Admitting: Internal Medicine

## 2022-12-14 ENCOUNTER — Encounter: Payer: Self-pay | Admitting: Internal Medicine

## 2022-12-15 ENCOUNTER — Telehealth: Payer: 59 | Admitting: Student in an Organized Health Care Education/Training Program

## 2022-12-15 ENCOUNTER — Encounter: Payer: Self-pay | Admitting: Internal Medicine

## 2022-12-15 ENCOUNTER — Ambulatory Visit (INDEPENDENT_AMBULATORY_CARE_PROVIDER_SITE_OTHER): Payer: 59 | Admitting: Internal Medicine

## 2022-12-15 VITALS — BP 122/86 | HR 52 | Temp 97.8°F | Ht 60.0 in | Wt 198.0 lb

## 2022-12-15 DIAGNOSIS — H532 Diplopia: Secondary | ICD-10-CM | POA: Insufficient documentation

## 2022-12-15 LAB — COMPREHENSIVE METABOLIC PANEL
ALT: 13 U/L (ref 0–35)
AST: 19 U/L (ref 0–37)
Albumin: 3.9 g/dL (ref 3.5–5.2)
Alkaline Phosphatase: 73 U/L (ref 39–117)
BUN: 27 mg/dL — ABNORMAL HIGH (ref 6–23)
CO2: 29 mEq/L (ref 19–32)
Calcium: 9.1 mg/dL (ref 8.4–10.5)
Chloride: 102 mEq/L (ref 96–112)
Creatinine, Ser: 1.17 mg/dL (ref 0.40–1.20)
GFR: 45.93 mL/min — ABNORMAL LOW (ref >60.00)
Glucose, Bld: 91 mg/dL (ref 70–99)
Potassium: 4 mEq/L (ref 3.5–5.1)
Sodium: 139 mEq/L (ref 135–145)
Total Bilirubin: 0.3 mg/dL (ref 0.2–1.2)
Total Protein: 6.4 g/dL (ref 6.0–8.3)

## 2022-12-15 LAB — CBC
HCT: 43 % (ref 36.0–46.0)
Hemoglobin: 14.2 g/dL (ref 12.0–15.0)
MCHC: 33 g/dL (ref 30.0–36.0)
MCV: 95.6 fl (ref 78.0–100.0)
Platelets: 283 10*3/uL (ref 150.0–400.0)
RBC: 4.5 Mil/uL (ref 3.87–5.11)
RDW: 13.6 % (ref 11.5–15.5)
WBC: 8.8 10*3/uL (ref 4.0–10.5)

## 2022-12-15 MED ORDER — TRAMADOL HCL 50 MG PO TABS: 50.0000 mg | ORAL_TABLET | Freq: Three times a day (TID) | ORAL | 0 refills | Status: DC | PRN

## 2022-12-15 NOTE — Telephone Encounter (Signed)
Called and spoke to pt. She is not having any other issues besides the blurred vision. States it is more at night and if she tries to focus it will clear up. Over the weekend, it was not clearing up. I made her an appt with Dr Alphonsus Sias this morning at 1145. I also advised ER/EMS precautions.

## 2022-12-15 NOTE — Progress Notes (Signed)
Subjective:    Patient ID: [name], female    DOB: 02/28/1946, 77 y.o.   MRN: [id number]  HPI Here due to double vision  Started noticing this about a month ago Notes double vision mostly at night--like watching TV Assumed it was just because she was tired Usually could concentrate and focus on it--but the other night she couldn't make it better  Has some tremor and increased sweating No drooping eyes No other weakness  Current Outpatient Medications on File Prior to Visit  Medication Sig Dispense Refill   Accu-Chek Softclix Lancets lancets Use as instructed 100 each 3   ARIPiprazole (ABILIFY) 2 MG tablet TAKE 1 TABLET BY MOUTH DAILY AT  BEDTIME 90 tablet 3   atorvastatin (LIPITOR) 80 MG tablet TAKE 1 TABLET BY MOUTH DAILY 100 tablet 3   donepezil (ARICEPT) 10 MG tablet Take 10 mg by mouth every morning.     DULoxetine (CYMBALTA) 30 MG capsule Take 30 mg by mouth daily.     gabapentin (NEURONTIN) 300 MG capsule Take 3 capsules (900 mg total) by mouth at bedtime. 300 capsule 3   lisinopril-hydrochlorothiazide (ZESTORETIC) 20-12.5 MG tablet Take 1 tablet by mouth daily. 90 tablet 3   Multiple Vitamins-Minerals (MULTIVITAMIN WITH MINERALS) tablet Take 1 tablet by mouth daily.      ONETOUCH VERIO test strip USE TO CHECK BLOOD SUGAR ONCE A DAY DX CODE E11.21 100 strip 12   traMADol (ULTRAM) 50 MG tablet Take 1-2 tablets (50-100 mg total) by mouth every 8 (eight) hours as needed. 180 tablet 0   amLODipine (NORVASC) 5 MG tablet Take 5 mg by mouth daily.     DULoxetine (CYMBALTA) 60 MG capsule TAKE 1 CAPSULE BY MOUTH DAILY 90 capsule 3   No current facility-administered medications on file prior to visit.    No Known Allergies  Past Medical History:  Diagnosis Date   Arthritis    Confusion    Diabetes mellitus without complication    type 2   Hepatitis    pt. denies   Hypertension    Lumbar stenosis    Memory loss    Nocturia    Numbness and tingling    legs and  feet   Wears glasses     Past Surgical History:  Procedure Laterality Date   BACK SURGERY     CESAREAN SECTION  1978   COLONOSCOPY     HEMORRHOID SURGERY  2015   JOINT REPLACEMENT     01-08-18 Dr. Maureen Ralphs   PACEMAKER LEADLESS INSERTION N/A 12/06/2021   Procedure: PACEMAKER LEADLESS INSERTION;  Surgeon: Marcina Millard, MD;  Location: [facility] INVASIVE CV LAB;  Service: Cardiovascular;  Laterality: N/A;   TOTAL HIP ARTHROPLASTY Left 01/08/2018   Procedure: LEFT TOTAL HIP ARTHROPLASTY ANTERIOR APPROACH;  Surgeon: Kathryne Hitch, MD;  Location: [facility];  Service: Orthopedics;  Laterality: Left;   TRANSFORAMINAL LUMBAR INTERBODY FUSION (TLIF) WITH PEDICLE SCREW FIXATION 2 LEVEL N/A 11/07/2015   Procedure: Lumbar three-four Lumbar four-five  transforaminal lumbar interbody fusion with interbody prosthesis posterior lateral arthrodesis and posterior segmental instrumentation;  Surgeon: Tressie Stalker, MD;  Location: [facility];  Service: Neurosurgery;  Laterality: N/A;    Family History  Problem Relation Age of Onset   Hypertension Other    Cancer Other        ovarian   Cancer Mother    Heart disease Father     Social History   Socioeconomic History   Marital status: Divorced  Spouse name: Not on file   Number of children: 4   Years of education: Not on file   Highest education level: Associate degree: occupational, Scientist, product/process development, or vocational program  Occupational History   Not on file  Tobacco Use   Smoking status: Never    Passive exposure: Never   Smokeless tobacco: Never  Vaping Use   Vaping Use: Never used  Substance and Sexual Activity   Alcohol use: No    Comment: rarely   Drug use: No   Sexual activity: Not Currently  Other Topics Concern   Not on file  Social History Narrative   Has a living will.   Son is health care POA   Would accept resuscitation.     Would accept life support    No tube feeds if cognitively unaware   Social Determinants  of Health   Financial Resource Strain: Low Risk  (12/15/2022)   Overall Financial Resource Strain (CARDIA)    Difficulty of Paying Living Expenses: Not hard at all  Food Insecurity: Food Insecurity Present (12/15/2022)   Hunger Vital Sign    Worried About Running Out of Food in the Last Year: Sometimes true    Ran Out of Food in the Last Year: Never true  Transportation Needs: No Transportation Needs (12/15/2022)   PRAPARE - Administrator, Civil Service (Medical): No    Lack of Transportation (Non-Medical): No  Physical Activity: Unknown (12/15/2022)   Exercise Vital Sign    Days of Exercise per Week: 0 days    Minutes of Exercise per Session: Not on file  Stress: No Stress Concern Present (12/15/2022)   [employer] of Occupational Health - Occupational Stress Questionnaire    Feeling of Stress : Only a little  Social Connections: Socially Isolated (12/15/2022)   Social Connection and Isolation Panel [NHANES]    Frequency of Communication with Friends and Family: Three times a week    Frequency of Social Gatherings with Friends and Family: Once a week    Attends Religious Services: Never    Database administrator or Organizations: No    Attends Engineer, structural: Not on file    Marital Status: Divorced  Intimate Partner Violence: Not At Risk (11/20/2017)   Humiliation, Afraid, Rape, and Kick questionnaire    Fear of Current or Ex-Partner: No    Emotionally Abused: No    Physically Abused: No    Sexually Abused: No   Review of Systems No headache Checking sugars sporadically (not as much lately)---but was 130 during event    Objective:   Physical Exam Constitutional:      Appearance: Normal appearance.  HENT:     Mouth/Throat:     Pharynx: No oropharyngeal exudate or posterior oropharyngeal erythema.  Eyes:     Extraocular Movements: Extraocular movements intact.     Pupils: Pupils are equal, round, and reactive to light.     Comments: ?very slight  alternating exotropia No nystagmus  Neurological:     Mental Status: She is alert.     Cranial Nerves: No cranial nerve deficit.            Assessment & Plan:

## 2022-12-15 NOTE — Assessment & Plan Note (Signed)
May have cranial nerve abnormality with slight alternating exotropia Will have her set up for full eye exam Check MRI (or CT if necessary due to pacemaker) Check acetylcholine receptor antibodies (for myasthenia) May need to proceed with neurology evaluation

## 2022-12-15 NOTE — Patient Instructions (Signed)
You are only allowed to take up to 2 tramadol three times a day----maximum 6 per day Please check the dose of duloxetine you are taking--- 30mg  or 60mg  daily

## 2022-12-15 NOTE — Addendum Note (Signed)
Addended by: Alvina Chou on: 12/15/2022 12:00 PM   Modules accepted: Orders

## 2022-12-16 ENCOUNTER — Encounter: Payer: Self-pay | Admitting: *Deleted

## 2022-12-17 ENCOUNTER — Encounter: Payer: Self-pay | Admitting: Internal Medicine

## 2022-12-17 DIAGNOSIS — H532 Diplopia: Secondary | ICD-10-CM | POA: Diagnosis not present

## 2022-12-17 DIAGNOSIS — H538 Other visual disturbances: Secondary | ICD-10-CM | POA: Diagnosis not present

## 2022-12-21 ENCOUNTER — Encounter: Payer: Self-pay | Admitting: Internal Medicine

## 2022-12-22 ENCOUNTER — Telehealth: Payer: Self-pay | Admitting: *Deleted

## 2022-12-22 NOTE — Patient Instructions (Signed)
Visit Information  Thank you for taking time to visit with me today. Please don't hesitate to contact me if I can be of assistance to you.   Following are the goals we discussed today:   Goals Addressed             This Visit's Progress    "I need in home care services"       Activities and task to complete in order to accomplish goals.   Patient to anticipate a call from [facility] to complete start of care [phone]           Our next appointment is by telephone on 12/25/22 at 11am  Please call the care guide team at [phone] if you need to cancel or reschedule your appointment.   If you are experiencing a Mental Health or Behavioral Health Crisis or need someone to talk to, please call 911   Patient verbalizes understanding of instructions and care plan provided today and agrees to view in MyChart. Active MyChart status and patient understanding of how to access instructions and care plan via MyChart confirmed with patient.     Telephone follow up appointment with care management team member scheduled for: 12/25/22  Verna Czech, LCSW Clinical Social Worker  [facility] Care Management [phone]

## 2022-12-22 NOTE — Telephone Encounter (Signed)
Noted. I will work on getting her worked in sooner.

## 2022-12-22 NOTE — Addendum Note (Signed)
Addended by: Tillman Abide I on: 12/22/2022 01:13 PM   Modules accepted: Orders

## 2022-12-22 NOTE — Telephone Encounter (Signed)
If needing to be done sooner than first available (5/31) or if the need for the MRI is of urgent nature, please change the order to a STAT.

## 2022-12-22 NOTE — Patient Outreach (Signed)
  Care Coordination   Follow Up Visit Note   12/22/2022 Name: [name] MRN: [id number] DOB: Dec 23, 1945  [name] is a 77 y.o. year old female who sees Karie Schwalbe, MD for primary care. I spoke with  [name] by phone today.  What matters to the patients health and wellness today?  Personal care services    Goals Addressed             This Visit's Progress    "I need in home care services"       Activities and task to complete in order to accomplish goals.   Patient to anticipate a call from [facility] to complete start of care [phone]           SDOH assessments and interventions completed:  No     Care Coordination Interventions:  Yes, provided  Interventions Today    Flowsheet Row Most Recent Value  Chronic Disease   Chronic disease during today's visit Diabetes, Chronic Obstructive Pulmonary Disease (COPD)  General Interventions   General Interventions Discussed/Reviewed General Interventions Reviewed, [employer], Communication with  Level of Care --  [patient received a letter of approval for personal care services 54 hours per month-phone call to [facility] to confirm that [facility] has accepted patient's referral  today and will reach out to her to shedule initial [phone]]       Follow up plan: Follow up call scheduled for 12/25/22    Encounter Outcome:  Pt. Visit Completed

## 2022-12-23 ENCOUNTER — Encounter: Payer: 59 | Admitting: *Deleted

## 2022-12-23 MED ORDER — LISINOPRIL-HYDROCHLOROTHIAZIDE 20-12.5 MG PO TABS: 2.0000 | ORAL_TABLET | Freq: Every day | ORAL | 3 refills | Status: DC

## 2022-12-23 NOTE — Telephone Encounter (Signed)
See referral notes for updates

## 2022-12-24 ENCOUNTER — Telehealth: Payer: Self-pay | Admitting: *Deleted

## 2022-12-24 NOTE — Patient Instructions (Signed)
Visit Information  Thank you for taking time to visit with me today. Please don't hesitate to contact me if I can be of assistance to you.   Following are the goals we discussed today:   Goals Addressed             This Visit's Progress    "I need in home care services"       Activities and task to complete in order to accomplish goals.   Patient did not receive call from [facility] [phone] VM full when trying to call Patient will review new list of providers and call [facility] back with home care touch Patient's neighbor to continue to assist with her care needs until personal care services are in place           Our next appointment is by telephone on 12/25/22 at 11am  Please call the care guide team at [phone] if you need to cancel or reschedule your appointment.   If you are experiencing a Mental Health or Behavioral Health Crisis or need someone to talk to, please call 911   Patient verbalizes understanding of instructions and care plan provided today and agrees to view in MyChart. Active MyChart status and patient understanding of how to access instructions and care plan via MyChart confirmed with patient.     Telephone follow up appointment with care management team member scheduled for: 12/25/22  Verna Czech, LCSW Clinical Social Worker  [facility] Care Management [phone]

## 2022-12-24 NOTE — Patient Outreach (Signed)
  Care Coordination   Follow Up Visit Note   12/24/2022 Name: [name] MRN: [id number] DOB: 09-23-1945  [name] is a 77 y.o. year old female who sees Karie Schwalbe, MD for primary care. I spoke with  [name] by phone today.  What matters to the patients health and wellness today?  In home care assistance. Patient would like to change home care agencies do to lack of follow up.    Goals Addressed             This Visit's Progress    "I need in home care services"       Activities and task to complete in order to accomplish goals.   Patient did not receive call from [facility] [phone] VM full when trying to call Patient will review new list of providers and call [facility] back with home care touch           SDOH assessments and interventions completed:  No     Care Coordination Interventions:  Yes, provided  Interventions Today    Flowsheet Row Most Recent Value  Chronic Disease   Chronic disease during today's visit Diabetes, Chronic Obstructive Pulmonary Disease (COPD)  General Interventions   General Interventions Discussed/Reviewed General Interventions Reviewed, Community Resources, Level of Care  Level of Care Personal Care Services  [Patient made multiple attempts to call [facility] agency[facility]-VM is full collaboration phone call to [facility] to change providers at patient's request-new list emailed to patient to choose a new provider]       Follow up plan: Follow up call scheduled for 12/24/22    Encounter Outcome:  Pt. Visit Completed

## 2022-12-25 ENCOUNTER — Ambulatory Visit: Payer: Self-pay | Admitting: *Deleted

## 2022-12-25 NOTE — Patient Instructions (Signed)
Visit Information  Thank you for taking time to visit with me today. Please don't hesitate to contact me if I can be of assistance to you.   Following are the goals we discussed today:   Goals Addressed             This Visit's Progress    "I need in home care services"       Activities and task to complete in order to accomplish goals.   Patient did not receive call from [facility] [phone] VM full when trying to call Patient chose [facility] home [facility] as new agency-patient  will call to follow up on referral [phone] Patient's neighbor to continue to assist with her care needs until personal care services are in place           Our next appointment is by telephone on 01/08/23 at 1pm  Please call the care guide team at [phone] if you need to cancel or reschedule your appointment.   If you are experiencing a Mental Health or Behavioral Health Crisis or need someone to talk to, please call 911   Patient verbalizes understanding of instructions and care plan provided today and agrees to view in MyChart. Active MyChart status and patient understanding of how to access instructions and care plan via MyChart confirmed with patient.     Telephone follow up appointment with care management team member scheduled for: 01/08/23   Verna Czech, LCSW Clinical Social Worker  [facility] Care Management [phone]

## 2022-12-25 NOTE — Patient Outreach (Signed)
  Care Coordination   Follow Up Visit Note   12/25/2022 Name: BEYZA BELLINO MRN: [id number] DOB: [birth year]  WAFAA DEEMER is a 77 y.o. year old female who sees Karie Schwalbe, MD for primary care. I spoke with  [name] by phone today.  What matters to the patients health and wellness today?  In home care assistance    Goals Addressed             This Visit's Progress    "I need in home care services"       Activities and task to complete in order to accomplish goals.   Patient did not receive call from [facility] [phone] VM full when trying to call [facility] chose [facility] as new agency-patient  will call to follow up on referral [phone] Patient's neighbor to continue to assist with her care needs until personal care services are in place           SDOH assessments and interventions completed:  No     Care Coordination Interventions:  Yes, provided  Interventions Today    Flowsheet Row Most Recent Value  Chronic Disease   Chronic disease during today's visit Diabetes, Chronic Kidney Disease/End Stage Renal Disease (ESRD)  General Interventions   General Interventions Discussed/Reviewed General Interventions Reviewed, [employer], Communication with  Level of Care Personal Care Services  [place] phone call to [employer] personal care agency chosen[facility]-contact info provided-patient to call to follow up on initial assessment [phone]]       Follow up plan: Follow up call scheduled for 01/07/13    Encounter Outcome:  Pt. Visit Completed

## 2022-12-31 ENCOUNTER — Telehealth: Payer: 59 | Admitting: Internal Medicine

## 2023-01-01 ENCOUNTER — Telehealth: Payer: Self-pay | Admitting: Internal Medicine

## 2023-01-01 ENCOUNTER — Encounter: Payer: Self-pay | Admitting: Internal Medicine

## 2023-01-01 ENCOUNTER — Ambulatory Visit (INDEPENDENT_AMBULATORY_CARE_PROVIDER_SITE_OTHER): Payer: 59 | Admitting: Internal Medicine

## 2023-01-01 VITALS — BP 124/74 | HR 85 | Temp 97.2°F | Ht 60.0 in | Wt 198.0 lb

## 2023-01-01 DIAGNOSIS — I10 Essential (primary) hypertension: Secondary | ICD-10-CM | POA: Diagnosis not present

## 2023-01-01 DIAGNOSIS — M25561 Pain in right knee: Secondary | ICD-10-CM | POA: Diagnosis not present

## 2023-01-01 DIAGNOSIS — F1120 Opioid dependence, uncomplicated: Secondary | ICD-10-CM | POA: Diagnosis not present

## 2023-01-01 DIAGNOSIS — R251 Tremor, unspecified: Secondary | ICD-10-CM | POA: Diagnosis not present

## 2023-01-01 DIAGNOSIS — G894 Chronic pain syndrome: Secondary | ICD-10-CM | POA: Diagnosis not present

## 2023-01-01 LAB — ACETYLCHOLINE RECEPTOR, MODULATING: Acetylchol Modul Ab: 4 % Inhibition

## 2023-01-01 NOTE — Progress Notes (Signed)
Subjective:    Patient ID: [name], female    DOB: 05-13-1946, 77 y.o.   MRN: [id number]  HPI Here for follow up of multiple medical issues  Still gets double vision ---notices it watching TV (mostly in evening) No ptosis May have some muscle weakness--but is getting injections from pain doctor  She is straight about the tramadol--knows that 6 is daily maximum Has had increasing tremors--both arms. Has affected her writing Not in legs---but does have some shaking in head (up and down) No change in chronic sense of stiffness  Continues on donepezil No sig memory changes of late No recent neurology follow up  Some right knee pain Seems to give out  Current Outpatient Medications on File Prior to Visit  Medication Sig Dispense Refill   Accu-Chek Softclix Lancets lancets Use as instructed 100 each 3   amLODipine (NORVASC) 5 MG tablet Take 5 mg by mouth daily.     ARIPiprazole (ABILIFY) 2 MG tablet TAKE 1 TABLET BY MOUTH DAILY AT  BEDTIME 90 tablet 3   atorvastatin (LIPITOR) 80 MG tablet TAKE 1 TABLET BY MOUTH DAILY 100 tablet 3   donepezil (ARICEPT) 10 MG tablet Take 10 mg by mouth every morning.     DULoxetine (CYMBALTA) 60 MG capsule TAKE 1 CAPSULE BY MOUTH DAILY 90 capsule 3   gabapentin (NEURONTIN) 300 MG capsule Take 3 capsules (900 mg total) by mouth at bedtime. 300 capsule 3   lisinopril-hydrochlorothiazide (ZESTORETIC) 20-12.5 MG tablet Take 2 tablets by mouth daily. 180 tablet 3   Multiple Vitamins-Minerals (MULTIVITAMIN WITH MINERALS) tablet Take 1 tablet by mouth daily.      ONETOUCH VERIO test strip USE TO CHECK BLOOD SUGAR ONCE A DAY DX CODE E11.21 100 strip 12   traMADol (ULTRAM) 50 MG tablet Take 1-2 tablets (50-100 mg total) by mouth every 8 (eight) hours as needed. 180 tablet 0   No current facility-administered medications on file prior to visit.    No Known Allergies  Past Medical History:  Diagnosis Date   Arthritis    Confusion    Diabetes  mellitus without complication    type 2   Hepatitis    pt. denies   Hypertension    Lumbar stenosis    Memory loss    Nocturia    Numbness and tingling    legs and feet   Wears glasses     Past Surgical History:  Procedure Laterality Date   BACK SURGERY     CESAREAN SECTION  1978   COLONOSCOPY     HEMORRHOID SURGERY  2015   JOINT REPLACEMENT     01-08-18 Dr. Maureen Ralphs   PACEMAKER LEADLESS INSERTION N/A 12/06/2021   Procedure: PACEMAKER LEADLESS INSERTION;  Surgeon: Marcina Millard, MD;  Location: [facility] INVASIVE CV LAB;  Service: Cardiovascular;  Laterality: N/A;   TOTAL HIP ARTHROPLASTY Left 01/08/2018   Procedure: LEFT TOTAL HIP ARTHROPLASTY ANTERIOR APPROACH;  Surgeon: Kathryne Hitch, MD;  Location: [facility] ORS;  Service: Orthopedics;  Laterality: Left;   TRANSFORAMINAL LUMBAR INTERBODY FUSION (TLIF) WITH PEDICLE SCREW FIXATION 2 LEVEL N/A 11/07/2015   Procedure: Lumbar three-four Lumbar four-five  transforaminal lumbar interbody fusion with interbody prosthesis posterior lateral arthrodesis and posterior segmental instrumentation;  Surgeon: Tressie Stalker, MD;  Location: [facility] NEURO ORS;  Service: Neurosurgery;  Laterality: N/A;    Family History  Problem Relation Age of Onset   Hypertension Other    Cancer Other        ovarian  Cancer Mother    Heart disease Father     Social History   Socioeconomic History   Marital status: Divorced    Spouse name: Not on file   Number of children: 4   Years of education: Not on file   Highest education level: Associate degree: occupational, Scientist, product/process development, or vocational program  Occupational History   Not on file  Tobacco Use   Smoking status: Never    Passive exposure: Never   Smokeless tobacco: Never  Vaping Use   Vaping Use: Never used  Substance and Sexual Activity   Alcohol use: No    Comment: rarely   Drug use: No   Sexual activity: Not Currently  Other Topics Concern   Not on file  Social History Narrative    Has a living will.   Son is health care POA   Would accept resuscitation.     Would accept life support    No tube feeds if cognitively unaware   Social Determinants of Health   Financial Resource Strain: Low Risk  (12/15/2022)   Overall Financial Resource Strain (CARDIA)    Difficulty of Paying Living Expenses: Not hard at all  Food Insecurity: Food Insecurity Present (12/15/2022)   Hunger Vital Sign    Worried About Running Out of Food in the Last Year: Sometimes true    Ran Out of Food in the Last Year: Never true  Transportation Needs: No Transportation Needs (12/15/2022)   PRAPARE - Administrator, Civil Service (Medical): No    Lack of Transportation (Non-Medical): No  Physical Activity: Unknown (12/15/2022)   Exercise Vital Sign    Days of Exercise per Week: 0 days    Minutes of Exercise per Session: Not on file  Stress: No Stress Concern Present (12/15/2022)   [employer] of Occupational Health - Occupational Stress Questionnaire    Feeling of Stress : Only a little  Social Connections: Socially Isolated (12/15/2022)   Social Connection and Isolation Panel [NHANES]    Frequency of Communication with Friends and Family: Three times a week    Frequency of Social Gatherings with Friends and Family: Once a week    Attends Religious Services: Never    Database administrator or Organizations: No    Attends Engineer, structural: Not on file    Marital Status: Divorced  Intimate Partner Violence: Not At Risk (11/20/2017)   Humiliation, Afraid, Rape, and Kick questionnaire    Fear of Current or Ex-Partner: No    Emotionally Abused: No    Physically Abused: No    Sexually Abused: No   Review of Systems Worries that she can't touch her hands together behind her back BP has been better Notes sweating--even with eating (discussed that this could be causing the sweating)    Objective:   Physical Exam Constitutional:      Appearance: Normal appearance.   Neurological:     Mental Status: She is alert.     Comments: Walks with walker---not shuffling No increased tone Slight head tremor--up and down No clear bradykinesia            Assessment & Plan:

## 2023-01-01 NOTE — Assessment & Plan Note (Signed)
Seeing the pain specialist Continues on the tramadol--up to 6 per day

## 2023-01-01 NOTE — Assessment & Plan Note (Signed)
No effusion--just has laxity lateral and medially without meniscus findings Discussed getting a brace with metal sides for stability

## 2023-01-01 NOTE — Patient Instructions (Signed)
Please set back up with Dr Sherryll Burger

## 2023-01-01 NOTE — Assessment & Plan Note (Signed)
BP Readings from Last 3 Encounters:  01/01/23 124/74  12/15/22 122/86  11/17/22 126/87   BP at home has been better (but still high) On amlodipine   and lisinopril HCTZ 40/25

## 2023-01-01 NOTE — Assessment & Plan Note (Signed)
PDMP reviewed No concerns 

## 2023-01-01 NOTE — Telephone Encounter (Signed)
[facility] called to let Carollee Herter known that the turn around time for results for  Acetylcholine Receptor test  per lab is unknown due to reagent issues.She said that she will call them back on Monday April 29,2024 to do an update.

## 2023-01-01 NOTE — Assessment & Plan Note (Signed)
Doesn't seem Parkinsonian Recommended she go back to Dr Sherryll Burger to review this

## 2023-01-02 ENCOUNTER — Encounter: Payer: Self-pay | Admitting: Internal Medicine

## 2023-01-02 NOTE — Telephone Encounter (Signed)
Result received today.

## 2023-01-05 ENCOUNTER — Ambulatory Visit (HOSPITAL_COMMUNITY)
Admission: RE | Admit: 2023-01-05 | Discharge: 2023-01-05 | Disposition: A | Discharge status: Home / Self Care | Payer: 59 | Source: Ambulatory Visit | Attending: Internal Medicine | Admitting: Internal Medicine

## 2023-01-05 DIAGNOSIS — H532 Diplopia: Secondary | ICD-10-CM | POA: Diagnosis not present

## 2023-01-05 DIAGNOSIS — R251 Tremor, unspecified: Secondary | ICD-10-CM | POA: Diagnosis not present

## 2023-01-05 DIAGNOSIS — J3489 Other specified disorders of nose and nasal sinuses: Secondary | ICD-10-CM | POA: Diagnosis not present

## 2023-01-07 ENCOUNTER — Ambulatory Visit
Admission: RE | Admit: 2023-01-07 | Discharge: 2023-01-07 | Disposition: A | Discharge status: Home / Self Care | Payer: 59 | Source: Ambulatory Visit | Attending: Student in an Organized Health Care Education/Training Program | Admitting: Student in an Organized Health Care Education/Training Program

## 2023-01-07 ENCOUNTER — Ambulatory Visit
Payer: 59 | Attending: Student in an Organized Health Care Education/Training Program | Admitting: Student in an Organized Health Care Education/Training Program

## 2023-01-07 ENCOUNTER — Encounter: Payer: Self-pay | Admitting: Student in an Organized Health Care Education/Training Program

## 2023-01-07 VITALS — BP 121/77 | HR 54 | Temp 97.0°F | Resp 15 | Ht 60.0 in | Wt 195.0 lb

## 2023-01-07 DIAGNOSIS — M25551 Pain in right hip: Secondary | ICD-10-CM

## 2023-01-07 DIAGNOSIS — M1611 Unilateral primary osteoarthritis, right hip: Secondary | ICD-10-CM

## 2023-01-07 MED ORDER — DIAZEPAM 5 MG PO TABS
ORAL_TABLET | ORAL | Status: AC
  Filled 2023-01-07: qty 1

## 2023-01-07 MED ORDER — LIDOCAINE HCL 2 % IJ SOLN
20.0000 mL | Freq: Once | INTRAMUSCULAR | Status: AC
  Administered 2023-01-07: 100 mg

## 2023-01-07 MED ORDER — ROPIVACAINE HCL 2 MG/ML IJ SOLN
4.0000 mL | Freq: Once | INTRAMUSCULAR | Status: AC
  Administered 2023-01-07: 4 mL via INTRA_ARTICULAR

## 2023-01-07 MED ORDER — METHYLPREDNISOLONE ACETATE 40 MG/ML IJ SUSP
40.0000 mg | Freq: Once | INTRAMUSCULAR | Status: AC
  Administered 2023-01-07: 40 mg via INTRA_ARTICULAR

## 2023-01-07 MED ORDER — METHYLPREDNISOLONE ACETATE 40 MG/ML IJ SUSP
INTRAMUSCULAR | Status: AC
  Filled 2023-01-07: qty 1

## 2023-01-07 MED ORDER — IOHEXOL 180 MG/ML  SOLN
10.0000 mL | Freq: Once | INTRAMUSCULAR | Status: AC
  Administered 2023-01-07: 10 mL via INTRA_ARTICULAR

## 2023-01-07 MED ORDER — IOHEXOL 180 MG/ML  SOLN
INTRAMUSCULAR | Status: AC
  Filled 2023-01-07: qty 20

## 2023-01-07 MED ORDER — ROPIVACAINE HCL 2 MG/ML IJ SOLN
INTRAMUSCULAR | Status: AC
  Filled 2023-01-07: qty 20

## 2023-01-07 MED ORDER — LIDOCAINE HCL (PF) 2 % IJ SOLN
INTRAMUSCULAR | Status: AC
  Filled 2023-01-07: qty 10

## 2023-01-07 MED ORDER — DIAZEPAM 5 MG PO TABS
5.0000 mg | ORAL_TABLET | ORAL | Status: AC
  Administered 2023-01-07: 5 mg via ORAL

## 2023-01-07 NOTE — Progress Notes (Signed)
Safety precautions to be maintained throughout the outpatient stay will include: orient to surroundings, keep bed in low position, maintain call bell within reach at all times, provide assistance with transfer out of bed and ambulation.  

## 2023-01-07 NOTE — Progress Notes (Signed)
PROVIDER NOTE: Interpretation of information contained herein should be left to medically-trained personnel. Specific patient instructions are provided elsewhere under "Patient Instructions" section of medical record. This document was created in part using STT-dictation technology, any transcriptional errors that may result from this process are unintentional.  Patient: [name] Type: Established DOB: 20-Nov-1945 MRN: [id number] PCP: Karie Schwalbe, MD  Service: Procedure DOS: 01/07/2023 Setting: Ambulatory Location: Ambulatory outpatient facility Delivery: Face-to-face Provider: Edward Jolly, MD Specialty: Interventional Pain Management Specialty designation: 09 Location: Outpatient facility Ref. Prov.: Karie Schwalbe, MD       Interventional Therapy   Procedure: Intra-articular hip injection  #1  Laterality: Right (-RT)  Approach: Percutaneous posterolateral approach. Level: Lower pelvic and hip joint level.  Imaging: Fluoroscopy-guided         Anesthesia: Local anesthesia (1-2% Lidocaine) Anxiolysis: 5 mg Valium DOS: 01/07/2023  Performed by: Edward Jolly, MD  Purpose: Diagnostic/Therapeutic Indications: Hip pain severe enough to impact quality of life or function. Rationale (medical necessity): procedure needed and proper for the diagnosis and/or treatment of [name]'s medical symptoms and Velez. 1. Right hip pain   2. Primary osteoarthritis of right hip    NAS-11 Pain score:   Pre-procedure: 4 /10   Post-procedure: 4 /10      Target: Intra-articular hip joint Region: Hip joint, upper (proximal) femoral region Type of procedure: Percutaneous joint injection   Position / Prep / Materials:  Position: Supine  Prep solution: DuraPrep (Iodine Povacrylex [0.7% available iodine] and Isopropyl Alcohol, 74% w/w) Prep Area:  Entire Posterolateral hip area. Materials:  Tray: Block tray Needle(s):  Type: Spinal  Gauge (G): 22  Length: 5-in  Qty: 1  Pre-op  H&P Assessment:  [name] is a 77 y.o. (year old), female patient, seen today for interventional treatment. She  has a past surgical history that includes Cesarean section (1978); Hemorrhoid surgery (2015); Colonoscopy; Transforaminal lumbar interbody fusion (tlif) with pedicle screw fixation 2 level (N/A, 11/07/2015); Joint replacement; Total hip arthroplasty (Left, 01/08/2018); Back surgery; and PACEMAKER LEADLESS INSERTION (N/A, 12/06/2021). [name] has a current medication list which includes the following prescription(s): accu-chek softclix lancets, amlodipine, aripiprazole, atorvastatin, donepezil, gabapentin, lisinopril-hydrochlorothiazide, multivitamin with minerals, onetouch verio, tramadol, and duloxetine. Her primarily concern today is the Hip Pain (right)  Initial Vital Signs:  Pulse/HCG Rate: (!) 54ECG Heart Rate: 88 Temp: (!) 97 F (36.1 C) Resp: 16 BP: (!) 138/91 SpO2: 99 %  BMI: Estimated body mass index is 38.08 kg/m as calculated from the following:   Height as of this encounter: 5' (1.524 m).   Weight as of this encounter: 195 lb (88.5 kg).  Risk Assessment: Allergies: Reviewed. She has No Known Allergies.  Allergy Precautions: None required Coagulopathies: Reviewed. None identified.  Blood-thinner therapy: None at this time Active Infection(s): Reviewed. None identified. [name] is afebrile  Site Confirmation: [name] was asked to confirm the procedure and laterality before marking the site Procedure checklist: Completed Consent: Before the procedure and under the influence of no sedative(s), amnesic(s), or anxiolytics, the patient was informed of the treatment options, risks and possible complications. To fulfill our ethical and legal obligations, as recommended by the American Medical Association's Code of Ethics, I have informed the patient of my clinical impression; the nature and purpose of the treatment or procedure; the risks, benefits, and  possible complications of the intervention; the alternatives, including doing nothing; the risk(s) and benefit(s) of the alternative treatment(s) or procedure(s); and the risk(s) and benefit(s) of  doing nothing. The patient was provided information about the general risks and possible complications associated with the procedure. These may include, but are not limited to: failure to achieve desired goals, infection, bleeding, organ or nerve damage, allergic reactions, paralysis, and death. In addition, the patient was informed of those risks and complications associated to the procedure, such as failure to decrease pain; infection; bleeding; organ or nerve damage with subsequent damage to sensory, motor, and/or autonomic systems, resulting in permanent pain, numbness, and/or weakness of one or several areas of the body; allergic reactions; (i.e.: anaphylactic reaction); and/or death. Furthermore, the patient was informed of those risks and complications associated with the medications. These include, but are not limited to: allergic reactions (i.e.: anaphylactic or anaphylactoid reaction(s)); adrenal axis suppression; blood sugar elevation that in diabetics may result in ketoacidosis or comma; water retention that in patients with history of congestive heart failure may result in shortness of breath, pulmonary edema, and decompensation with resultant heart failure; weight gain; swelling or edema; medication-induced neural toxicity; particulate matter embolism and blood vessel occlusion with resultant organ, and/or nervous system infarction; and/or aseptic necrosis of one or more joints. Finally, the patient was informed that Medicine is not an exact science; therefore, there is also the possibility of unforeseen or unpredictable risks and/or possible complications that may result in a catastrophic outcome. The patient indicated having understood very clearly. We have given the patient no guarantees and we have  made no promises. Enough time was given to the patient to ask questions, all of which were answered to the patient's satisfaction. [name] has indicated that she wanted to continue with the procedure. Attestation: I, the ordering provider, attest that I have discussed with the patient the benefits, risks, side-effects, alternatives, likelihood of achieving goals, and potential problems during recovery for the procedure that I have provided informed consent. Date  Time: 01/07/2023 11:09 AM  Pre-Procedure Preparation:  Monitoring: As per clinic protocol. Respiration, ETCO2, SpO2, BP, heart rate and rhythm monitor placed and checked for adequate function Safety Precautions: Patient was assessed for positional comfort and pressure points before starting the procedure. Time-out: I initiated and conducted the "Time-out" before starting the procedure, as per protocol. The patient was asked to participate by confirming the accuracy of the "Time Out" information. Verification of the correct person, site, and procedure were performed and confirmed by me, the nursing staff, and the patient. "Time-out" conducted as per Joint Commission's Universal Protocol (UP.01.01.01). Time: 1208 Start Time: 1208 hrs.  Description/Narrative of Procedure:          Rationale (medical necessity): procedure needed and proper for the diagnosis and/or treatment of the patient's medical symptoms and Velez. Procedural Technique Safety Precautions: Aspiration looking for blood return was conducted prior to all injections. At no point did we inject any substances, as a needle was being advanced. No attempts were made at seeking any paresthesias. Safe injection practices and needle disposal techniques used. Medications properly checked for expiration dates. SDV (single dose vial) medications used. Description of the Procedure: Protocol guidelines were followed. The patient was assisted into a comfortable position. The target area was  identified and the area prepped in the usual manner. Skin & deeper tissues infiltrated with local anesthetic. Appropriate amount of time allowed to pass for local anesthetics to take effect. The procedure needles were then advanced to the target area. Proper needle placement secured. Negative aspiration confirmed. Solution injected in intermittent fashion, asking for systemic symptoms every 0.5cc of injectate. The needles  were then removed and the area cleansed, making sure to leave some of the prepping solution back to take advantage of its long term bactericidal properties.  Technical description of procedure:  Skin & deeper tissues infiltrated with local anesthetic. Appropriate amount of time allowed to pass for local anesthetics to take effect. The procedure needles were then advanced to the target area. Proper needle placement secured. Negative aspiration confirmed. Solution injected in intermittent fashion, asking for systemic symptoms every 0.5cc of injectate. The needles were then removed and the area cleansed, making sure to leave some of the prepping solution back to take advantage of its long term bactericidal properties.  5 cc solution made of 4 cc of 0.2% ropivacaine 1 cc of methylprednisolone, 40 mg/cc.  Injected into the right hip under fluoroscopy after contrast confirmation.       Vitals:   01/07/23 1127 01/07/23 1207 01/07/23 1210  BP: (!) 138/91 (!) 141/76 121/77  Pulse: (!) 54    Resp:  16 15  Temp: (!) 97 F (36.1 C)    TempSrc: Temporal    SpO2: 99% 95% 95%  Weight: 195 lb (88.5 kg)    Height: 5' (1.524 m)       Start Time: 1208 hrs. End Time: 1210 hrs.  Imaging Guidance (Non-Spinal):          Type of Imaging Technique: Fluoroscopy Guidance (Non-Spinal) Indication(s): Assistance in needle guidance and placement for procedures requiring needle placement in or near specific anatomical locations not easily accessible without such assistance. Exposure Time: Please see  nurses notes. Contrast: Before injecting any contrast, we confirmed that the patient did not have an allergy to iodine, shellfish, or radiological contrast. Once satisfactory needle placement was completed at the desired level, radiological contrast was injected. Contrast injected under live fluoroscopy. No contrast complications. See chart for type and volume of contrast used. Fluoroscopic Guidance: I was personally present during the use of fluoroscopy. "Tunnel Vision Technique" used to obtain the best possible view of the target area. Parallax error corrected before commencing the procedure. "Direction-depth-direction" technique used to introduce the needle under continuous pulsed fluoroscopy. Once target was reached, antero-posterior, oblique, and lateral fluoroscopic projection used confirm needle placement in all planes. Images permanently stored in EMR. Interpretation: I personally interpreted the imaging intraoperatively. Adequate needle placement confirmed in multiple planes. Appropriate spread of contrast into desired area was observed. No evidence of afferent or efferent intravascular uptake. Permanent images saved into the patient's record.  Post-operative Assessment:  Post-procedure Vital Signs:  Pulse/HCG Rate: (!) 5479 Temp: (!) 97 F (36.1 C) Resp: 15 BP: 121/77 SpO2: 95 %  EBL: None  Complications: No immediate post-treatment complications observed by team, or reported by patient.  Note: The patient tolerated the entire procedure well. A repeat set of vitals were taken after the procedure and the patient was kept under observation following institutional policy, for this type of procedure. Post-procedural neurological assessment was performed, showing return to baseline, prior to discharge. The patient was provided with post-procedure discharge instructions, including a section on how to identify potential problems. Should any problems arise concerning this procedure, the patient  was given instructions to immediately contact us, at any time, without hesitation. In any case, we plan to contact the patient by telephone for a follow-up status report regarding this interventional procedure.  Comments:  No additional relevant information.  Plan of Care (POC)  Orders:  Orders Placed This Encounter  Procedures   DG PAIN CLINIC C-ARM 1-60 MIN NO  REPORT    Intraoperative interpretation by procedural physician at [facility].    Standing Status:   Standing    Number of Occurrences:   1    Order Specific Question:   Reason for exam:    Answer:   Assistance in needle guidance and placement for procedures requiring needle placement in or near specific anatomical locations not easily accessible without such assistance.    Medications ordered for procedure: Meds ordered this encounter  Medications   iohexol (OMNIPAQUE) 180 MG/ML injection 10 mL    Must be Myelogram-compatible. If not available, you may substitute with a water-soluble, non-ionic, hypoallergenic, myelogram-compatible radiological contrast medium.   lidocaine (XYLOCAINE) 2 % (with pres) injection 400 mg   methylPREDNISolone acetate (DEPO-MEDROL) injection 40 mg   ropivacaine (PF) 2 mg/mL (0.2%) (NAROPIN) injection 4 mL   diazepam (VALIUM) tablet 5 mg    Make sure Flumazenil is available in the pyxis when using this medication. If oversedation occurs, administer 0.2 mg IV over 15 sec. If after 45 sec no response, administer 0.2 mg again over 1 min; may repeat at 1 min intervals; not to exceed 4 doses (1 mg)   Medications administered: We administered iohexol, lidocaine, methylPREDNISolone acetate, ropivacaine (PF) 2 mg/mL (0.2%), and diazepam.  See the medical record for exact dosing, route, and time of administration.  Follow-up plan:   Return in about 5 weeks (around 02/11/2023) for Post Procedure Evaluation, virtual.       Left L1/2 ESI 10/15/22, Right L1, L2 TF ESI 11/17/22, right hip IA  injection     Recent Visits Date Type Provider Dept  12/10/22 Office Visit Edward Jolly, MD [facility]  11/17/22 Procedure visit Edward Jolly, MD [facility]  11/10/22 Office Visit Edward Jolly, MD [facility]  10/15/22 Procedure visit Edward Jolly, MD [facility]  Showing recent visits within past 90 days and meeting all other requirements Today's Visits Date Type Provider Dept  01/07/23 Procedure visit Edward Jolly, MD [facility]  Showing today's visits and meeting all other requirements Future Appointments Date Type Provider Dept  02/11/23 Appointment Edward Jolly, MD [facility]  Showing future appointments within next 90 days and meeting all other requirements  Disposition: Discharge home  Discharge (Date  Time): 01/07/2023; 1220 hrs.   Primary Care Physician: Karie Schwalbe, MD Location: [facility] Note by: Edward Jolly, MD (TTS technology used. I apologize for any typographical errors that were not detected and corrected.) Date: 01/07/2023; Time: 12:23 PM  Disclaimer:  Medicine is not an Visual merchandiser. The only guarantee in medicine is that nothing is guaranteed. It is important to note that the decision to proceed with this intervention was based on the information collected from the patient. The Data and conclusions were drawn from the patient's questionnaire, the interview, and the physical examination. Because the information was provided in large part by the patient, it cannot be guaranteed that it has not been purposely or unconsciously manipulated. Every effort has been made to obtain as much relevant data as possible for this evaluation. It is important to note that the conclusions that lead to this procedure are derived in large part from the available data. Always take into account that the treatment will also be dependent on availability of resources and existing treatment  guidelines, considered by other Pain Management Practitioners as being common knowledge and practice, at the time of the intervention. For Medico-Legal purposes, it is also  important to point out that variation in procedural techniques and pharmacological choices are the acceptable norm. The indications, contraindications, technique, and results of the above procedure should only be interpreted and judged by a Board-Certified Interventional Pain Specialist with extensive familiarity and expertise in the same exact procedure and technique.

## 2023-01-07 NOTE — Patient Instructions (Signed)

## 2023-01-08 ENCOUNTER — Ambulatory Visit: Payer: Self-pay | Admitting: *Deleted

## 2023-01-08 ENCOUNTER — Telehealth: Payer: Self-pay

## 2023-01-08 NOTE — Patient Instructions (Signed)
Visit Information  Thank you for taking time to visit with me today. Please don't hesitate to contact me if I can be of assistance to you.   Following are the goals we discussed today:   Goals Addressed             This Visit's Progress    "I need in home care services"       Activities and task to complete in order to accomplish goals.   Patient has been accepted by [facility] [phone] assessment completed-they are now working on staffing Patient's neighbor to continue to assist with her care needs until personal care services are in place           Our next appointment is by telephone on 01/22/23 at 1pm  Please call the care guide team at [phone] if you need to cancel or reschedule your appointment.   If you are experiencing a Mental Health or Behavioral Health Crisis or need someone to talk to, please call the Suicide and Crisis Lifeline: [phone]   Patient verbalizes understanding of instructions and care plan provided today and agrees to view in MyChart. Active MyChart status and patient understanding of how to access instructions and care plan via MyChart confirmed with patient.     Telephone follow up appointment with care management team member scheduled for: 01/22/23  Verna Czech, LCSW Clinical Social Worker  [facility] [phone]

## 2023-01-08 NOTE — Telephone Encounter (Signed)
Post procedure follow up. Patient states she is doing fine.  

## 2023-01-08 NOTE — Patient Outreach (Signed)
  Care Coordination   Follow Up Visit Note   01/08/2023 Name: [name] MRN: [id number] DOB: [birth year]  [name] is a 77 y.o. year old female who sees Karie Schwalbe, MD for primary care. I spoke with  [name] by phone today.  What matters to the patients health and wellness today?  Patient needing in home care.    Goals Addressed             This Visit's Progress    "I need in home care services"       Activities and task to complete in order to accomplish goals.   Patient has been accepted by [facility] [phone] assessment completed-they are now working on staffing Patient's neighbor to continue to assist with her care needs until personal care services are in place           SDOH assessments and interventions completed:  No     Care Coordination Interventions:  Yes, provided  Interventions Today    Flowsheet Row Most Recent Value  Chronic Disease   Chronic disease during today's visit Diabetes, Chronic Kidney Disease/End Stage Renal Disease (ESRD)  General Interventions   General Interventions Discussed/Reviewed General Interventions Reviewed, Community Resources, Level of Care  Level of Care --  [confirmed that patient has been accepted by [facility] [phone] confirmed that patient has been assessed by phone-now waiting on staffing]       Follow up plan: Follow up call scheduled for 01/22/23    Encounter Outcome:  Pt. Visit Completed

## 2023-01-14 ENCOUNTER — Other Ambulatory Visit: Payer: Self-pay | Admitting: Internal Medicine

## 2023-01-14 ENCOUNTER — Telehealth: Payer: Self-pay | Admitting: Internal Medicine

## 2023-01-14 MED ORDER — TRAMADOL HCL 50 MG PO TABS: 50.0000 mg | ORAL_TABLET | Freq: Three times a day (TID) | ORAL | 0 refills | Status: DC | PRN

## 2023-01-14 NOTE — Telephone Encounter (Signed)
Last filled 12-15-22 #180 Last OV 01-01-23 Next OV 04-02-23 [facility]

## 2023-01-14 NOTE — Telephone Encounter (Signed)
Dr Alphonsus Sias did refill earlier today at request of the pharmacy.

## 2023-01-14 NOTE — Telephone Encounter (Signed)
Prescription Request  01/14/2023  LOV: 01/01/2023  What is the name of the medication or equipment? traMADol (ULTRAM) 50 MG tablet   Have you contacted your pharmacy to request a refill? No   Which pharmacy would you like this sent to?  [id number], [place] - [address] [place] [address] Phone: [phone] Fax: [phone]    Patient notified that their request is being sent to the clinical staff for review and that they should receive a response within 2 business days.   Please advise at Mobile [phone] (mobile)

## 2023-01-16 DIAGNOSIS — H2513 Age-related nuclear cataract, bilateral: Secondary | ICD-10-CM | POA: Diagnosis not present

## 2023-01-16 DIAGNOSIS — H524 Presbyopia: Secondary | ICD-10-CM | POA: Diagnosis not present

## 2023-01-16 DIAGNOSIS — H52213 Irregular astigmatism, bilateral: Secondary | ICD-10-CM | POA: Diagnosis not present

## 2023-01-16 DIAGNOSIS — E119 Type 2 diabetes mellitus without complications: Secondary | ICD-10-CM | POA: Diagnosis not present

## 2023-01-16 LAB — HM DIABETES EYE EXAM

## 2023-01-21 ENCOUNTER — Encounter: Payer: Self-pay | Admitting: Internal Medicine

## 2023-01-22 ENCOUNTER — Ambulatory Visit: Payer: Self-pay | Admitting: *Deleted

## 2023-01-22 NOTE — Patient Instructions (Signed)
Visit Information  Thank you for taking time to visit with me today. Please don't hesitate to contact me if I can be of assistance to you.   Following are the goals we discussed today:   Goals Addressed             This Visit's Progress    "I need in home care services"       Activities and task to complete in order to accomplish goals.   Patient has been accepted by [facility] [phone] assessment completed-they are continuing to work on staffing-patient willing to be flexible with hours needed Patient's neighbor to continue to assist with her care needs until personal care services are in place           Our next appointment is by telephone on 02/04/33 at 10am  Please call the care guide team at [phone] if you need to cancel or reschedule your appointment.   If you are experiencing a Mental Health or Behavioral Health Crisis or need someone to talk to, please call [phone]   Patient verbalizes understanding of instructions and care plan provided today and agrees to view in MyChart. Active MyChart status and patient understanding of how to access instructions and care plan via MyChart confirmed with patient.     Telephone follow up appointment with care management team member scheduled for: 02/05/23   Verna Czech, LCSW Clinical Social Worker  [facility]

## 2023-01-22 NOTE — Patient Outreach (Signed)
  Care Coordination   Follow Up Visit Note   01/22/2023 Name: [name] MRN: [id number] DOB: [birth year]  [name] is a 77 y.o. year old female who sees Karie Schwalbe, MD for primary care. I spoke with  [name] by phone today.  What matters to the patients health and wellness today?  In home care services    Goals Addressed             This Visit's Progress    "I need in home care services"       Activities and task to complete in order to accomplish goals.   Patient has been accepted by [facility] [phone] assessment completed-they are continuing to work on staffing-patient willing to be flexible with hours needed Patient's neighbor to continue to assist with her care needs until personal care services are in place           SDOH assessments and interventions completed:  No     Care Coordination Interventions:  Yes, provided  Interventions Today    Flowsheet Row Most Recent Value  Chronic Disease   Chronic disease during today's visit Diabetes, Chronic Kidney Disease/End Stage Renal Disease (ESRD)  General Interventions   General Interventions Discussed/Reviewed General Interventions Reviewed, [facility], Level of Care, Communication with  Communication with --  [[facility]-Staffing still pending-patient willing to be flexible with hours needed-agency will call this CSW with available staffing]  Level of Care Personal Care Services       Follow up plan: Follow up call scheduled for 02/05/23    Encounter Outcome:  Pt. Visit Completed

## 2023-01-27 ENCOUNTER — Telehealth: Payer: Self-pay | Admitting: *Deleted

## 2023-01-27 ENCOUNTER — Other Ambulatory Visit: Payer: Self-pay | Admitting: Internal Medicine

## 2023-01-27 NOTE — Patient Outreach (Signed)
  Care Coordination   Follow Up Visit Note   01/27/2023 Name: [name] MRN: [id number] DOB: 01/31/1946  [name] is a 77 y.o. year old female who sees Karie Schwalbe, MD for primary care. I spoke with  [name] by phone today.  What matters to the patients health and wellness today?  Personal Care Services. Per patient the chose agency has not called her regarding staffing availability. Contact information provided to patient and encouraged her to call regarding status of her referral.    Goals Addressed             This Visit's Progress    "I need in home care services"       Activities and task to complete in order to accomplish goals.   Patient has been accepted by [facility] [phone] assessment completed-they are continuing to work on staffing Patient agreeable to contacting [facility] for status of personal care aid           SDOH assessments and interventions completed:  No     Care Coordination Interventions:  Yes, provided  Interventions Today    Flowsheet Row Most Recent Value  Chronic Disease   Chronic disease during today's visit Diabetes, Chronic Kidney Disease/End Stage Renal Disease (ESRD)  General Interventions   General Interventions Discussed/Reviewed General Interventions Reviewed, Community Resources, Level of Care  Level of Care Personal Care Services  [patient states that she has not heard for personal care agency  to date-CSW encouraged patient to call the chosen  agency to follow [facility] Care [phone]       Follow up plan: Follow up call scheduled for 02/04/22    Encounter Outcome:  Pt. Visit Completed

## 2023-01-27 NOTE — Patient Instructions (Signed)
Visit Information  Thank you for taking time to visit with me today. Please don't hesitate to contact me if I can be of assistance to you.   Following are the goals we discussed today:   Goals Addressed             This Visit's Progress    "I need in home care services"       Activities and task to complete in order to accomplish goals.   Patient has been accepted by [facility] [phone] assessment completed-they are continuing to work on staffing Patient agreeable to contacting [facility] for status of personal care aid           Our next appointment is by telephone on 02/05/23 at 10am  Please call the care guide team at [phone] if you need to cancel or reschedule your appointment.   If you are experiencing a Mental Health or Behavioral Health Crisis or need someone to talk to, please call 911   Patient verbalizes understanding of instructions and care plan provided today and agrees to view in MyChart. Active MyChart status and patient understanding of how to access instructions and care plan via MyChart confirmed with patient.     Telephone follow up appointment with care management team member scheduled for: 02/05/23   Verna Czech, LCSW Clinical Social Worker  [facility] Care Management [phone]

## 2023-01-31 ENCOUNTER — Encounter: Payer: Self-pay | Admitting: Internal Medicine

## 2023-02-05 ENCOUNTER — Ambulatory Visit: Payer: Self-pay | Admitting: *Deleted

## 2023-02-05 ENCOUNTER — Encounter: Payer: 59 | Admitting: *Deleted

## 2023-02-05 NOTE — Patient Instructions (Signed)
Visit Information  Thank you for taking time to visit with me today. Please don't hesitate to contact me if I can be of assistance to you.   Following are the goals we discussed today:   Goals Addressed             This Visit's Progress    "I need in home care services"       Activities and task to complete in order to accomplish goals.   Patient has been accepted by [facility] [phone] assessment completed-aid assigned and will start on Monday, 02/09/23           Our next appointment is by telephone on 02/11/23 at 2pm  Please call the care guide team at [phone] if you need to cancel or reschedule your appointment.   If you are experiencing a Mental Health or Behavioral Health Crisis or need someone to talk to, please call 911   Patient verbalizes understanding of instructions and care plan provided today and agrees to view in MyChart. Active MyChart status and patient understanding of how to access instructions and care plan via MyChart confirmed with patient.     Telephone follow up appointment with care management team member scheduled for: 02/11/23  Verna Czech, LCSW Clinical Social Worker  [facility] Care Management [phone]

## 2023-02-05 NOTE — Patient Outreach (Signed)
  Care Coordination   Follow Up Visit Note   02/05/2023 Name: TALULLAH TORRY MRN: [id number] DOB: [birth year]  EARNESTINE CIANO is a 77 y.o. year old female who sees Karie Schwalbe, MD for primary care. I spoke with  [name] by phone today.  What matters to the patients health and wellness today?  In home care assistance    Goals Addressed             This Visit's Progress    "I need in home care services"       Activities and task to complete in order to accomplish goals.   Patient has been accepted by [facility] [phone] assessment completed-aid assigned and will start on Monday, 02/09/23           SDOH assessments and interventions completed:  No     Care Coordination Interventions:  Yes, provided  Interventions Today    Flowsheet Row Most Recent Value  Chronic Disease   Chronic disease during today's visit Diabetes, Chronic Kidney Disease/End Stage Renal Disease (ESRD)  General Interventions   General Interventions Discussed/Reviewed General Interventions Reviewed, [employer], Level of Care, Communication with  Communication with --  [facility] Care contacted, they have identified an aid that will start on Monday, 02/09/23, they will contact patient to confirm]  Level of Care Personal Care Services  [confirmed that patient has not been assigned a in home aid to date]       Follow up plan: Follow up call scheduled for 02/11/23    Encounter Outcome:  Pt. Visit Completed

## 2023-02-06 ENCOUNTER — Ambulatory Visit (HOSPITAL_COMMUNITY): Payer: 59

## 2023-02-10 ENCOUNTER — Ambulatory Visit: Payer: Self-pay | Admitting: *Deleted

## 2023-02-10 NOTE — Patient Instructions (Signed)
Visit Information  Thank you for taking time to visit with me today. Please don't hesitate to contact me if I can be of assistance to you.   Following are the goals we discussed today:   Goals Addressed             This Visit's Progress    "I need in home care services"       Activities and task to complete in order to accomplish goals.   Patient has been accepted by [facility] [phone] assessment completed, however primary needs are housekeeping, not personal care. Alternatives will need to be discussed is additional assistance is needed           Our next appointment is by telephone on 02/24/23 at 2pm  Please call the care guide team at [phone] if you need to cancel or reschedule your appointment.   If you are experiencing a Mental Health or Behavioral Health Crisis or need someone to talk to, please call 911   Patient verbalizes understanding of instructions and care plan provided today and agrees to view in MyChart. Active MyChart status and patient understanding of how to access instructions and care plan via MyChart confirmed with patient.     Telephone follow up appointment with care management team member scheduled for: 02/24/23  Verna Czech, LCSW Clinical Social Worker  [facility] Care Management [phone]

## 2023-02-10 NOTE — Patient Outreach (Signed)
  Care Coordination   Follow Up Visit Note   02/10/2023 Name: [name] MRN: [id number] DOB: Apr 12, 1946  [name] is a 77 y.o. year old female who sees Karie Schwalbe, MD for primary care. I spoke with  [name] by phone today.  What matters to the patients health and wellness today?  In home care alternatives. Patient's current personal care aid is no longer available due to patient's needs being primarily housekeeping. This Child psychotherapist to follow up with patient to discuss alternatives for assistance, including out of pocket options.    Goals Addressed             This Visit's Progress    "I need in home care services"       Activities and task to complete in order to accomplish goals.   Patient has been accepted by [facility] [phone] assessment completed, however primary needs are housekeeping, not personal care. Alternatives will need to be discussed is additional assistance is needed           SDOH assessments and interventions completed:  No     Care Coordination Interventions:  Yes, provided  Interventions Today    Flowsheet Row Most Recent Value  Chronic Disease   Chronic disease during today's visit Diabetes, Chronic Kidney Disease/End Stage Renal Disease (ESRD)  General Interventions   General Interventions Discussed/Reviewed General Interventions Reviewed, [facility], Communication with  Communication with --  [facility] agency confirmed that they were no longer able to work with patient due to the primary need being housekeeping and not personal care. Patient notified and will be contacted next week to discuss alternatives]  Level of Care Personal Care Services  [patient's  current PCS worker is not longer able to return as patient's needs are mainly housekeeping, no hands on care needed]       Follow up plan: Follow up call scheduled for 02/24/23    Encounter Outcome:  Pt. Visit Completed

## 2023-02-11 ENCOUNTER — Telehealth: Payer: Self-pay | Admitting: Student in an Organized Health Care Education/Training Program

## 2023-02-11 ENCOUNTER — Ambulatory Visit
Payer: 59 | Attending: Student in an Organized Health Care Education/Training Program | Admitting: Student in an Organized Health Care Education/Training Program

## 2023-02-11 ENCOUNTER — Encounter: Payer: 59 | Admitting: *Deleted

## 2023-02-11 DIAGNOSIS — M25551 Pain in right hip: Secondary | ICD-10-CM | POA: Diagnosis not present

## 2023-02-11 DIAGNOSIS — G894 Chronic pain syndrome: Secondary | ICD-10-CM

## 2023-02-11 DIAGNOSIS — M1611 Unilateral primary osteoarthritis, right hip: Secondary | ICD-10-CM | POA: Diagnosis not present

## 2023-02-11 NOTE — Telephone Encounter (Signed)
PT called stated that she doesn't take blood thinner anymore. PT stated that she was taking blood thinner however she was told by Dr. Cherylann Ratel she shouldn't take blood thinner. Please give patient a call if you have any more questions. TY

## 2023-02-11 NOTE — Telephone Encounter (Signed)
Dr Cherylann Ratel, you have a phone appointmnet with this patient today.  I am assuming there was some confusion on her part about stopping her blood thinner.  She says you told her to stop so she is no longer taking the blood thinner apparently. Looks like she had an appoiintment for a procedure on 01-07-2023.  Can you discuss this blood thinner  situation with her when you talk to her today? Thanks

## 2023-02-11 NOTE — Progress Notes (Signed)
Patient: [name]  Service Category: E/M  Provider: Edward Jolly, MD  DOB: Oct 12, 1945  DOS: 02/11/2023  Location: Office  MRN: [id number]  Setting: Ambulatory outpatient  Referring Provider: Karie Schwalbe, MD  Type: Established Patient  Specialty: Interventional Pain Management  PCP: Karie Schwalbe, MD  Location: Remote location  Delivery: TeleHealth     Virtual Encounter - Pain Management PROVIDER NOTE: Information contained herein reflects review and annotations entered in association with encounter. Interpretation of such information and data should be left to medically-trained personnel. Information provided to patient can be located elsewhere in the medical record under "Patient Instructions". Document created using STT-dictation technology, any transcriptional errors that may result from process are unintentional.    Contact & Pharmacy Preferred: [phone] Home: [phone] (home) Mobile: [phone] (mobile) E-mail: [email or web address]  [facility] [phone] Judithann Sheen, [place] - [address] [place] [address] Phone: [phone] Fax: [phone]  [facility] - [place], [place] - [address] [place] [address] Phone: [phone] Fax: [phone]   Pre-screening  Ms. Harju offered "in-person" vs "virtual" encounter. She indicated preferring virtual for this encounter.   Reason COVID-19*  Social distancing based on CDC and AMA recommendations.   I contacted CHLOYE SPICUZZA on 02/11/2023 via telephone.      I clearly identified myself as Edward Jolly, MD. I verified that I was speaking with the correct person using two identifiers (Name: [name], and date of birth: February 28, 1946).  Consent I sought verbal advanced consent from [name] for virtual visit interactions. I informed Ms. Michal of possible security and privacy concerns, risks, and limitations associated with  providing "not-in-person" medical evaluation and management services. I also informed Ms. Osterhoudt of the availability of "in-person" appointments. Finally, I informed her that there would be a charge for the virtual visit and that she could be  personally, fully or partially, financially responsible for it. Ms. Barquin expressed understanding and agreed to proceed.   Historic Elements   [name] is a 77 y.o. year old, female patient evaluated today after our last contact on 02/11/2023. [name]  has a past medical history of Arthritis, Confusion, Diabetes mellitus without complication (HCC), Hepatitis, Hypertension, Lumbar stenosis, Memory loss, Nocturia, Numbness and tingling, and Wears glasses. She also  has a past surgical history that includes Cesarean section (1978); Hemorrhoid surgery (2015); Colonoscopy; Transforaminal lumbar interbody fusion (tlif) with pedicle screw fixation 2 level (N/A, 11/07/2015); Joint replacement; Total hip arthroplasty (Left, 01/08/2018); Back surgery; and PACEMAKER LEADLESS INSERTION (N/A, 12/06/2021). [name] has a current medication list which includes the following prescription(s): accu-chek softclix lancets, amlodipine, aripiprazole, atorvastatin, donepezil, duloxetine, gabapentin, lisinopril-hydrochlorothiazide, multivitamin with minerals, onetouch verio, and tramadol. She  reports that she has never smoked. She has never been exposed to tobacco smoke. She has never used smokeless tobacco. She reports that she does not drink alcohol and does not use drugs. Ms. Teska has No Known Allergies.  BMI: Estimated body mass index is 38.08 kg/m as calculated from the following:   Height as of 01/07/23: 5' (1.524 m).   Weight as of 01/07/23: 195 lb (88.5 kg). Last encounter: 12/10/2022. Last procedure: 01/07/2023.  HPI  Today, she is being contacted for a post-procedure assessment.    Post-procedure evaluation   Procedure: Intra-articular hip injection  #1   Laterality: Right (-RT)  Approach: Percutaneous posterolateral approach. Level: Lower pelvic and hip joint level.  Imaging: Fluoroscopy-guided         Anesthesia: Local anesthesia (1-2% Lidocaine) Anxiolysis: 5 mg Valium DOS: 01/07/2023  Performed by: Edward Jolly, MD  Purpose: Diagnostic/Therapeutic Indications: Hip pain severe enough to impact quality of life or function. Rationale (medical necessity): procedure needed and proper for the diagnosis and/or treatment of [name]'s medical symptoms and needs. 1. Right hip pain   2. Primary osteoarthritis of right hip    NAS-11 Pain score:   Pre-procedure: 4 /10   Post-procedure: 4 /10       Effectiveness:  Initial hour after procedure: 0 %  Subsequent 4-6 hours post-procedure: 0 %  Analgesia past initial 6 hours: 0 %  Ongoing improvement:  Analgesic:  0%    Laboratory Chemistry Profile   Renal Lab Results  Component Value Date   BUN 27 (H) 12/15/2022   CREATININE 1.17 12/15/2022   BCR 25 (H) 11/10/2019   GFR 45.93 (L) 12/15/2022   GFRAA 53 (L) 06/10/2020   GFRNONAA 50 (L) 12/07/2021    Hepatic Lab Results  Component Value Date   AST 19 12/15/2022   ALT 13 12/15/2022   ALBUMIN 3.9 12/15/2022   ALKPHOS 73 12/15/2022   HCVAB NEGATIVE 06/12/2016    Electrolytes Lab Results  Component Value Date   NA 139 12/15/2022   K 4.0 12/15/2022   CL 102 12/15/2022   CALCIUM 9.1 12/15/2022   MG 2.2 12/03/2021   PHOS 3.8 07/01/2022    Bone No results found for: "VD25OH", "VD125OH2TOT", "ZO1096EA5", "WU9811BJ4", "25OHVITD1", "25OHVITD2", "25OHVITD3", "TESTOFREE", "TESTOSTERONE"  Inflammation (CRP: Acute Phase) (ESR: Chronic Phase) Lab Results  Component Value Date   CRP 0.7 11/10/2019   ESRSEDRATE 14 11/10/2019         Note: Above Lab results reviewed.   Assessment  The primary encounter diagnosis was Right hip pain. Diagnoses of Primary osteoarthritis of right hip and Chronic pain syndrome were also pertinent  to this visit.  Plan of Care  Unfortunately no benefit after hip injection Repeat TF ESI PRN  Follow-up plan:   Return for patient will call to schedule F2F appt prn.      Left L1/2 ESI 10/15/22, Right L1, L2 TF ESI 11/17/22, right hip IA injection     Recent Visits Date Type Provider Dept  01/07/23 Procedure visit Edward Jolly, MD [facility]  12/10/22 Office Visit Edward Jolly, MD [facility]  11/17/22 Procedure visit Edward Jolly, MD [facility]  Showing recent visits within past 90 days and meeting all other requirements Today's Visits Date Type Provider Dept  02/11/23 Office Visit Edward Jolly, MD [facility]  Showing today's visits and meeting all other requirements Future Appointments No visits were found meeting these conditions. Showing future appointments within next 90 days and meeting all other requirements  I discussed the assessment and treatment plan with the patient. The patient was provided an opportunity to ask questions and all were answered. The patient agreed with the plan and demonstrated an understanding of the instructions.  Patient advised to call back or seek an in-person evaluation if the symptoms or condition worsens.  Duration of encounter: .  Note by: Edward Jolly, MD Date: 02/11/2023; Time: 9:52 AM

## 2023-02-12 ENCOUNTER — Encounter: Payer: Self-pay | Admitting: Internal Medicine

## 2023-02-12 MED ORDER — TRAMADOL HCL 50 MG PO TABS: 50.0000 mg | ORAL_TABLET | Freq: Three times a day (TID) | ORAL | 0 refills | Status: DC | PRN

## 2023-02-12 NOTE — Telephone Encounter (Signed)
Last filled 01-14-23 #180 Last OV 01-01-23 Next OV 04-02-23 [facility]

## 2023-02-13 ENCOUNTER — Encounter: Payer: Self-pay | Admitting: Internal Medicine

## 2023-02-16 ENCOUNTER — Telehealth: Payer: Self-pay | Admitting: *Deleted

## 2023-02-17 DIAGNOSIS — R251 Tremor, unspecified: Secondary | ICD-10-CM | POA: Diagnosis not present

## 2023-02-17 DIAGNOSIS — Z8673 Personal history of transient ischemic attack (TIA), and cerebral infarction without residual deficits: Secondary | ICD-10-CM | POA: Diagnosis not present

## 2023-02-17 DIAGNOSIS — G5793 Unspecified mononeuropathy of bilateral lower limbs: Secondary | ICD-10-CM | POA: Diagnosis not present

## 2023-02-17 DIAGNOSIS — M5136 Other intervertebral disc degeneration, lumbar region: Secondary | ICD-10-CM | POA: Diagnosis not present

## 2023-02-17 NOTE — Patient Outreach (Signed)
  Care Coordination   Follow Up Visit Note   02/17/2023 Late Entry Name: [name] MRN: [id number] DOB: 1945-09-17  [name] is a 77 y.o. year old female who sees Karie Schwalbe, MD for primary care. I spoke with  [name] by phone on 02/16/23  What matters to the patients health and wellness today?  Housekeeping    Goals Addressed             This Visit's Progress    "I need in home care services"       Activities and task to complete in order to accomplish goals.   Patient has been accepted by [facility] [phone] assessment completed, however primary needs are housekeeping, not personal care. Alternatives discussed including private pay housekeeping  as well as requesting assistance from family/friends.            SDOH assessments and interventions completed:  No     Care Coordination Interventions:  Yes, provided  Interventions Today    Flowsheet Row Most Recent Value  Chronic Disease   Chronic disease during today's visit Diabetes, Chronic Obstructive Pulmonary Disease (COPD)  General Interventions   General Interventions Discussed/Reviewed General Interventions Reviewed, Community Resources, Level of Care  Level of Care Personal Care Services  [patient discussed need for more of a housekeeper vs personal care and is requesting resources. CSW discussed out of pocket options as well as requesting assistance from friends and family. Pt confirmed having a friend that may be able to assist her]       Follow up plan: No further intervention required.   Encounter Outcome:  Pt. Visit Completed

## 2023-02-17 NOTE — Patient Instructions (Signed)
Visit Information  Thank you for taking time to visit with me today. Please don't hesitate to contact me if I can be of assistance to you.   Following are the goals we discussed today:   Goals Addressed             This Visit's Progress    "I need in home care services"       Activities and task to complete in order to accomplish goals.   Patient has been accepted by [facility] [phone] assessment completed, however primary needs are housekeeping, not personal care. Alternatives discussed including private pay housekeeping  as well as requesting assistance from family/friends.            If you are experiencing a Mental Health or Behavioral Health Crisis or need someone to talk to, please call 911   Patient verbalizes understanding of instructions and care plan provided today and agrees to view in MyChart. Active MyChart status and patient understanding of how to access instructions and care plan via MyChart confirmed with patient.     No further follow up required: Patient to contact this Child psychotherapist with any additional community resource needs   [employer], LCSW Clinical Social Worker  [facility] Care Management [phone]

## 2023-02-22 ENCOUNTER — Encounter: Payer: Self-pay | Admitting: Internal Medicine

## 2023-02-24 ENCOUNTER — Ambulatory Visit: Payer: Self-pay | Admitting: *Deleted

## 2023-02-24 NOTE — Patient Outreach (Signed)
  Care Coordination   Follow Up Visit Note   02/24/2023 Name: [name] MRN: [id number] DOB: October 02, 1945  [name] is a 77 y.o. year old female who sees Karie Schwalbe, MD for primary care. I spoke with  [name] by phone today.  What matters to the patients health and wellness today?  Patient states that she now has an aid from [facility] that comes 5x per week, for 2 hours. Patient states assistance is working out well, patient verbalized having no additional needs at this time.    Goals Addressed             This Visit's Progress    COMPLETED: "I need in home care services"        Interventions Today    Flowsheet Row Most Recent Value  Chronic Disease   Chronic disease during today's visit Chronic Obstructive Pulmonary Disease (COPD)  General Interventions   General Interventions Discussed/Reviewed General Interventions Reviewed, Community Resources  Level of Care Personal Care Services  [Patient confirms that she now has aid that comes 4x per week , 2 hours per day, arrangement working out well now]               SDOH assessments and interventions completed:  No     Care Coordination Interventions:  Yes, provided   Follow up plan: No further intervention required.   Encounter Outcome:  Pt. Visit Completed

## 2023-02-24 NOTE — Patient Instructions (Signed)
Visit Information  Thank you for taking time to visit with me today. Please don't hesitate to contact me if I can be of assistance to you.   Following are the goals we discussed today:  Patient to continue to receive personal care services through [facility]  Patient to contact this social worker with any additional community resource needs    If you are experiencing a Mental Health or Behavioral Health Crisis or need someone to talk to, please call 911   Patient verbalizes understanding of instructions and care plan provided today and agrees to view in MyChart. Active MyChart status and patient understanding of how to access instructions and care plan via MyChart confirmed with patient.     No further follow up required: patient to contact this Child psychotherapist with any additional community resource needs   [employer], LCSW Clinical Social Worker  [facility] Care Management [phone]

## 2023-03-09 ENCOUNTER — Encounter: Payer: Self-pay | Admitting: *Deleted

## 2023-03-09 ENCOUNTER — Other Ambulatory Visit: Payer: Self-pay | Admitting: Internal Medicine

## 2023-03-09 NOTE — Telephone Encounter (Signed)
This encounter was created in error - please disregard.

## 2023-03-10 ENCOUNTER — Telehealth: Payer: Self-pay | Admitting: *Deleted

## 2023-03-10 NOTE — Patient Instructions (Signed)
Visit Information  Thank you for taking time to visit with me today. Please don't hesitate to contact me if I can be of assistance to you.   Following are the goals we discussed today:  Please call [facility] to request another [facility] provider [phone]    Please call the care guide team at [phone] if you need to cancel or reschedule your appointment.   If you are experiencing a Mental Health or Behavioral Health Crisis or need someone to talk to, please call 911   Patient verbalizes understanding of instructions and care plan provided today and agrees to view in MyChart. Active MyChart status and patient understanding of how to access instructions and care plan via MyChart confirmed with patient.     No further follow up required: patient to contact this Child psychotherapist with any additional community resource needs  [employer], LCSW Clinical Social Worker  [facility] Care Management [phone]

## 2023-03-10 NOTE — Patient Outreach (Addendum)
  Care Coordination   Follow Up Visit Note   03/10/2023 Name: [name] MRN: [id number] DOB: 1946/01/15  [name] is a 77 y.o. year old female who sees Karie Schwalbe, MD for primary care. I engaged with [name] in the providers office today.  What matters to the patients health and wellness today?  Personal Care Services    Goals Addressed             This Visit's Progress    COMPLETED: "I need in home care services"        Interventions Today    Flowsheet Row Most Recent Value  Chronic Disease   Chronic disease during today's visit Chronic Obstructive Pulmonary Disease (COPD)  General Interventions   General Interventions Discussed/Reviewed Level of Care  Level of Care Personal Care Services  [patient confirms that her current PCS agency is no longer able to provide care, patient provided with the contact number for [facility] to call to choose another provider [phone] patient agrees to call]               SDOH assessments and interventions completed:  No     Care Coordination Interventions:  Yes, provided   Follow up plan: No further intervention required.   Encounter Outcome:  Pt. Visit Completed

## 2023-03-14 ENCOUNTER — Encounter: Payer: Self-pay | Admitting: Internal Medicine

## 2023-03-16 ENCOUNTER — Other Ambulatory Visit: Payer: Self-pay | Admitting: Internal Medicine

## 2023-03-16 DIAGNOSIS — I872 Venous insufficiency (chronic) (peripheral): Secondary | ICD-10-CM | POA: Diagnosis not present

## 2023-03-16 MED ORDER — TRAMADOL HCL 50 MG PO TABS: 50.0000 mg | ORAL_TABLET | Freq: Three times a day (TID) | ORAL | 0 refills | Status: DC | PRN

## 2023-03-16 NOTE — Telephone Encounter (Signed)
Last filled 02-12-23 #180 Last OV 01-01-23 Next OV 04-02-23 [facility]

## 2023-04-02 ENCOUNTER — Encounter: Payer: Self-pay | Admitting: Internal Medicine

## 2023-04-02 ENCOUNTER — Telehealth: Payer: Self-pay | Admitting: Internal Medicine

## 2023-04-02 ENCOUNTER — Ambulatory Visit (INDEPENDENT_AMBULATORY_CARE_PROVIDER_SITE_OTHER): Payer: 59 | Admitting: Internal Medicine

## 2023-04-02 VITALS — BP 122/78 | HR 50 | Temp 97.4°F | Ht 60.0 in | Wt 195.0 lb

## 2023-04-02 DIAGNOSIS — G8929 Other chronic pain: Secondary | ICD-10-CM | POA: Diagnosis not present

## 2023-04-02 DIAGNOSIS — G3184 Mild cognitive impairment, so stated: Secondary | ICD-10-CM

## 2023-04-02 DIAGNOSIS — F1120 Opioid dependence, uncomplicated: Secondary | ICD-10-CM

## 2023-04-02 DIAGNOSIS — I10 Essential (primary) hypertension: Secondary | ICD-10-CM

## 2023-04-02 DIAGNOSIS — E1140 Type 2 diabetes mellitus with diabetic neuropathy, unspecified: Secondary | ICD-10-CM | POA: Diagnosis not present

## 2023-04-02 DIAGNOSIS — M5441 Lumbago with sciatica, right side: Secondary | ICD-10-CM | POA: Diagnosis not present

## 2023-04-02 MED ORDER — LISINOPRIL-HYDROCHLOROTHIAZIDE 20-12.5 MG PO TABS: 2.0000 | ORAL_TABLET | Freq: Every day | ORAL | 3 refills | Status: DC

## 2023-04-02 NOTE — Telephone Encounter (Signed)
Rx sent electronically.  

## 2023-04-02 NOTE — Assessment & Plan Note (Signed)
PDMP reviewed No concerns 

## 2023-04-02 NOTE — Telephone Encounter (Signed)
Prescription Request  04/02/2023  LOV: 04/02/2023  What is the name of the medication or equipment? lisinopril-hydrochlorothiazide (ZESTORETIC) 20-12.5 MG tablet    Have you contacted your pharmacy to request a refill? Yes   Which pharmacy would you like this sent to?    [facility] - [place], [place] - [address] [place]  [address] Phone: [phone] Fax: [phone]    Patient notified that their request is being sent to the clinical staff for review and that they should receive a response within 2 business days.   Please advise at Mobile [phone] (mobile)   Pt states she spoke to [facility] & they're requesting a new rx from [facility] for the meds.

## 2023-04-02 NOTE — Assessment & Plan Note (Signed)
BP Readings from Last 3 Encounters:  04/02/23 122/78  01/07/23 121/77  01/01/23 124/74   She gets up to 150 systolic at home--she will bring in her cuff next time On amlodipine 5mg , lisinopril/hydrochlorothiazide 40/25

## 2023-04-02 NOTE — Assessment & Plan Note (Signed)
Ongoing pain and disability Is starting with personal care aide Tramadol 50mg  --up to 6 per day Discussed trying the tylenol with it--could be synergistic Duloxetine also--radicular  pain

## 2023-04-02 NOTE — Progress Notes (Signed)
Subjective:    Patient ID: [name], female    DOB: 1945-10-20, 77 y.o.   MRN: [id number]  HPI Here for follow up of chronic pain and narcotic dependence  Still has constant bad pain Low back down right side to ankle Tramadol helps and uses tylenol regularly--discussed trying to take them together On duloxetine  Ongoing mood issues---depression due to pain/limitations Can't even stand up straight Has to have rollator to walk  Does have personal care aide coming---assist with showering, food preparation Uses bathroom and can dress herself (generally stays in her night gown)  Recent visit with Dr Glenna Fellows changes  Micah Flesher to pain clinic--got injected in right hip--no help  Checking sugars at home once a week Usually under 120  Current Outpatient Medications on File Prior to Visit  Medication Sig Dispense Refill   Accu-Chek Softclix Lancets lancets Use as instructed 100 each 3   amLODipine (NORVASC) 5 MG tablet Take 5 mg by mouth daily.     ARIPiprazole (ABILIFY) 2 MG tablet TAKE 1 TABLET BY MOUTH DAILY AT  BEDTIME 90 tablet 3   atorvastatin (LIPITOR) 80 MG tablet TAKE 1 TABLET BY MOUTH ONCE  DAILY 100 tablet 3   donepezil (ARICEPT) 10 MG tablet Take 10 mg by mouth every morning.     DULoxetine (CYMBALTA) 60 MG capsule TAKE 1 CAPSULE BY MOUTH DAILY 90 capsule 3   gabapentin (NEURONTIN) 300 MG capsule Take 3 capsules (900 mg total) by mouth at bedtime. 300 capsule 3   lisinopril-hydrochlorothiazide (ZESTORETIC) 20-12.5 MG tablet Take 2 tablets by mouth daily. 180 tablet 3   Multiple Vitamins-Minerals (MULTIVITAMIN WITH MINERALS) tablet Take 1 tablet by mouth daily.      ONETOUCH VERIO test strip USE TO CHECK BLOOD SUGAR ONCE A DAY DX CODE E11.21 100 strip 12   traMADol (ULTRAM) 50 MG tablet Take 1-2 tablets (50-100 mg total) by mouth every 8 (eight) hours as needed. 180 tablet 0   No current facility-administered medications on file prior to visit.    No Known  Allergies  Past Medical History:  Diagnosis Date   Arthritis    Confusion    Diabetes mellitus without complication (HCC)    type 2   Hepatitis    pt. denies   Hypertension    Lumbar stenosis    Memory loss    Nocturia    Numbness and tingling    legs and feet   Wears glasses     Past Surgical History:  Procedure Laterality Date   BACK SURGERY     CESAREAN SECTION  1978   COLONOSCOPY     HEMORRHOID SURGERY  2015   JOINT REPLACEMENT     01-08-18 Dr. Maureen Ralphs   PACEMAKER LEADLESS INSERTION N/A 12/06/2021   Procedure: PACEMAKER LEADLESS INSERTION;  Surgeon: Marcina Millard, MD;  Location: [facility];  Service: Cardiovascular;  Laterality: N/A;   TOTAL HIP ARTHROPLASTY Left 01/08/2018   Procedure: LEFT TOTAL HIP ARTHROPLASTY ANTERIOR APPROACH;  Surgeon: Kathryne Hitch, MD;  Location: [facility];  Service: Orthopedics;  Laterality: Left;   TRANSFORAMINAL LUMBAR INTERBODY FUSION (TLIF) WITH PEDICLE SCREW FIXATION 2 LEVEL N/A 11/07/2015   Procedure: Lumbar three-four Lumbar four-five  transforaminal lumbar interbody fusion with interbody prosthesis posterior lateral arthrodesis and posterior segmental instrumentation;  Surgeon: Tressie Stalker, MD;  Location: [facility];  Service: Neurosurgery;  Laterality: N/A;    Family History  Problem Relation Age of Onset   Hypertension Other  Cancer Other        ovarian   Cancer Mother    Heart disease Father     Social History   Socioeconomic History   Marital status: Divorced    Spouse name: Not on file   Number of children: 4   Years of education: Not on file   Highest education level: Associate degree: occupational, Scientist, product/process development, or vocational program  Occupational History   Not on file  Tobacco Use   Smoking status: Never    Passive exposure: Never   Smokeless tobacco: Never  Vaping Use   Vaping status: Never Used  Substance and Sexual Activity   Alcohol use: No    Comment: rarely   Drug use: No    Sexual activity: Not Currently  Other Topics Concern   Not on file  Social History Narrative   Has a living will.   Son is health care POA   Would accept resuscitation.     Would accept life support    No tube feeds if cognitively unaware   Social Determinants of Health   Financial Resource Strain: Low Risk  (12/15/2022)   Overall Financial Resource Strain (CARDIA)    Difficulty of Paying Living Expenses: Not hard at all  Food Insecurity: Food Insecurity Present (12/15/2022)   Hunger Vital Sign    Worried About Running Out of Food in the Last Year: Sometimes true    Ran Out of Food in the Last Year: Never true  Transportation Needs: No Transportation Needs (12/15/2022)   PRAPARE - Administrator, Civil Service (Medical): No    Lack of Transportation (Non-Medical): No  Physical Activity: Unknown (12/15/2022)   Exercise Vital Sign    Days of Exercise per Week: 0 days    Minutes of Exercise per Session: Not on file  Stress: No Stress Concern Present (12/15/2022)   [employer] of Occupational Health - Occupational Stress Questionnaire    Feeling of Stress : Only a little  Social Connections: Socially Isolated (12/15/2022)   Social Connection and Isolation Panel [NHANES]    Frequency of Communication with Friends and Family: Three times a week    Frequency of Social Gatherings with Friends and Family: Once a week    Attends Religious Services: Never    Database administrator or Organizations: No    Attends Engineer, structural: Not on file    Marital Status: Divorced  Intimate Partner Violence: Not At Risk (11/20/2017)   Humiliation, Afraid, Rape, and Kick questionnaire    Fear of Current or Ex-Partner: No    Emotionally Abused: No    Physically Abused: No    Sexually Abused: No   Review of Systems Sleeping okay Appetite variable--but generally fair No weight loss     Objective:   Physical Exam Constitutional:      Appearance: Normal appearance.   Pulmonary:     Effort: Pulmonary effort is normal. No respiratory distress.  Neurological:     Mental Status: She is alert.  Psychiatric:        Mood and Affect: Mood normal.        Behavior: Behavior normal.            Assessment & Plan:

## 2023-04-02 NOTE — Assessment & Plan Note (Signed)
Cognition is stable --on the donepezil 10

## 2023-04-02 NOTE — Addendum Note (Signed)
Addended by: Eual Fines on: 04/02/2023 02:33 PM   Modules accepted: Orders

## 2023-04-02 NOTE — Assessment & Plan Note (Signed)
Lab Results  Component Value Date   HGBA1C 6.1 (A) 10/01/2022   Sugars all under 120 without meds Will recheck all labs at next visit

## 2023-04-04 ENCOUNTER — Encounter: Payer: Self-pay | Admitting: Internal Medicine

## 2023-04-06 NOTE — Telephone Encounter (Signed)
Reviewed in chart did not see where patient has been on Plavix recently. Please advise

## 2023-04-07 ENCOUNTER — Encounter: Payer: Self-pay | Admitting: Internal Medicine

## 2023-04-11 ENCOUNTER — Encounter: Payer: Self-pay | Admitting: Internal Medicine

## 2023-04-13 MED ORDER — TRAMADOL HCL 50 MG PO TABS: 50.0000 mg | ORAL_TABLET | Freq: Three times a day (TID) | ORAL | 0 refills | Status: DC | PRN

## 2023-04-13 NOTE — Telephone Encounter (Signed)
Last filled 03-16-23 #180 Last OV 04-02-23 Next OV 07-03-23 [facility]

## 2023-05-05 ENCOUNTER — Other Ambulatory Visit: Payer: Self-pay | Admitting: Internal Medicine

## 2023-05-07 ENCOUNTER — Encounter: Payer: Self-pay | Admitting: Internal Medicine

## 2023-05-07 ENCOUNTER — Other Ambulatory Visit: Payer: Self-pay

## 2023-05-08 ENCOUNTER — Ambulatory Visit (INDEPENDENT_AMBULATORY_CARE_PROVIDER_SITE_OTHER): Payer: 59 | Admitting: Internal Medicine

## 2023-05-08 ENCOUNTER — Encounter: Payer: Self-pay | Admitting: *Deleted

## 2023-05-08 ENCOUNTER — Encounter: Payer: Self-pay | Admitting: Internal Medicine

## 2023-05-08 VITALS — BP 132/82 | HR 98 | Temp 97.8°F | Ht 60.0 in | Wt 193.0 lb

## 2023-05-08 DIAGNOSIS — M48061 Spinal stenosis, lumbar region without neurogenic claudication: Secondary | ICD-10-CM

## 2023-05-08 DIAGNOSIS — M5136 Other intervertebral disc degeneration, lumbar region: Secondary | ICD-10-CM

## 2023-05-08 NOTE — Assessment & Plan Note (Signed)
She feels things are worse Will set back up with Dr Lovell Sheehan for reevaluation

## 2023-05-08 NOTE — Progress Notes (Signed)
Subjective:    Patient ID: [name], female    DOB: 01/25/1946, 77 y.o.   MRN: [id number]  HPI Here due to concerns that she is getting worse  Chronic back problems Did seem better for some time after surgery about 10 years ago Had to stop driving 3 years ago Now using rollator for a year--other walker for some time  Reviewed the MRI from 2/23---progression of disc disease and stenosis in several levels Dr Lovell Sheehan did surgery Myelogram done in 2017--no sig neural compression  Steroid injections from Dr Lateef--didn't help  Feels like she continues to decline  Current Outpatient Medications on File Prior to Visit  Medication Sig Dispense Refill   Accu-Chek Softclix Lancets lancets Use as instructed 100 each 3   amLODipine (NORVASC) 5 MG tablet Take 5 mg by mouth daily.     ARIPiprazole (ABILIFY) 2 MG tablet TAKE 1 TABLET BY MOUTH DAILY AT  BEDTIME 90 tablet 3   aspirin EC 81 MG tablet Take 81 mg by mouth 3 (three) times a week.     atorvastatin (LIPITOR) 80 MG tablet TAKE 1 TABLET BY MOUTH ONCE  DAILY 100 tablet 3   donepezil (ARICEPT) 10 MG tablet Take 10 mg by mouth every morning.     DULoxetine (CYMBALTA) 60 MG capsule TAKE 1 CAPSULE BY MOUTH DAILY 90 capsule 3   gabapentin (NEURONTIN) 300 MG capsule Take 3 capsules (900 mg total) by mouth at bedtime. 300 capsule 3   lisinopril-hydrochlorothiazide (ZESTORETIC) 20-12.5 MG tablet Take 2 tablets by mouth daily. 180 tablet 3   Multiple Vitamins-Minerals (MULTIVITAMIN WITH MINERALS) tablet Take 1 tablet by mouth daily.      ONETOUCH VERIO test strip USE TO CHECK BLOOD SUGAR ONCE A DAY DX CODE E11.21 100 strip 12   traMADol (ULTRAM) 50 MG tablet Take 1-2 tablets (50-100 mg total) by mouth every 8 (eight) hours as needed. 180 tablet 0   No current facility-administered medications on file prior to visit.    No Known Allergies  Past Medical History:  Diagnosis Date   Arthritis    Confusion    Diabetes mellitus without  complication (HCC)    type 2   Hepatitis    pt. denies   Hypertension    Lumbar stenosis    Memory loss    Nocturia    Numbness and tingling    legs and feet   Wears glasses     Past Surgical History:  Procedure Laterality Date   BACK SURGERY     CESAREAN SECTION  1978   COLONOSCOPY     HEMORRHOID SURGERY  2015   JOINT REPLACEMENT     01-08-18 Dr. Maureen Ralphs   PACEMAKER LEADLESS INSERTION N/A 12/06/2021   Procedure: PACEMAKER LEADLESS INSERTION;  Surgeon: Marcina Millard, MD;  Location: [facility];  Service: Cardiovascular;  Laterality: N/A;   TOTAL HIP ARTHROPLASTY Left 01/08/2018   Procedure: LEFT TOTAL HIP ARTHROPLASTY ANTERIOR APPROACH;  Surgeon: Kathryne Hitch, MD;  Location: [facility];  Service: Orthopedics;  Laterality: Left;   TRANSFORAMINAL LUMBAR INTERBODY FUSION (TLIF) WITH PEDICLE SCREW FIXATION 2 LEVEL N/A 11/07/2015   Procedure: Lumbar three-four Lumbar four-five  transforaminal lumbar interbody fusion with interbody prosthesis posterior lateral arthrodesis and posterior segmental instrumentation;  Surgeon: Tressie Stalker, MD;  Location: [facility];  Service: Neurosurgery;  Laterality: N/A;    Family History  Problem Relation Age of Onset   Hypertension Other    Cancer Other  ovarian   Cancer Mother    Heart disease Father     Social History   Socioeconomic History   Marital status: Divorced    Spouse name: Not on file   Number of children: 4   Years of education: Not on file   Highest education level: Associate degree: occupational, Scientist, product/process development, or vocational program  Occupational History   Not on file  Tobacco Use   Smoking status: Never    Passive exposure: Never   Smokeless tobacco: Never  Vaping Use   Vaping status: Never Used  Substance and Sexual Activity   Alcohol use: No    Comment: rarely   Drug use: No   Sexual activity: Not Currently  Other Topics Concern   Not on file  Social History Narrative   Has a  living will.   Son is health care POA   Would accept resuscitation.     Would accept life support    No tube feeds if cognitively unaware   Social Determinants of Health   Financial Resource Strain: Low Risk  (12/15/2022)   Overall Financial Resource Strain (CARDIA)    Difficulty of Paying Living Expenses: Not hard at all  Food Insecurity: Food Insecurity Present (12/15/2022)   Hunger Vital Sign    Worried About Running Out of Food in the Last Year: Sometimes true    Ran Out of Food in the Last Year: Never true  Transportation Needs: No Transportation Needs (12/15/2022)   PRAPARE - Administrator, Civil Service (Medical): No    Lack of Transportation (Non-Medical): No  Physical Activity: Unknown (12/15/2022)   Exercise Vital Sign    Days of Exercise per Week: 0 days    Minutes of Exercise per Session: Not on file  Stress: No Stress Concern Present (12/15/2022)   [employer] of Occupational Health - Occupational Stress Questionnaire    Feeling of Stress : Only a little  Social Connections: Socially Isolated (12/15/2022)   Social Connection and Isolation Panel [NHANES]    Frequency of Communication with Friends and Family: Three times a week    Frequency of Social Gatherings with Friends and Family: Once a week    Attends Religious Services: Never    Database administrator or Organizations: No    Attends Engineer, structural: Not on file    Marital Status: Divorced  Intimate Partner Violence: Not At Risk (11/20/2017)   Humiliation, Afraid, Rape, and Kick questionnaire    Fear of Current or Ex-Partner: No    Emotionally Abused: No    Physically Abused: No    Sexually Abused: No   Review of Systems     Objective:   Physical Exam Neurological:     Mental Status: She is alert.     Comments: Walks slowly hunched over rollator No focal weakness            Assessment & Plan:

## 2023-05-12 ENCOUNTER — Encounter: Payer: Self-pay | Admitting: Internal Medicine

## 2023-05-12 MED ORDER — TRAMADOL HCL 50 MG PO TABS: 50.0000 mg | ORAL_TABLET | Freq: Three times a day (TID) | ORAL | 0 refills | Status: DC | PRN

## 2023-05-12 NOTE — Telephone Encounter (Signed)
Patient called in and stated that for future refills can this be sent over to [facility] [place], [place] - [address] Thank you!

## 2023-05-15 ENCOUNTER — Encounter: Payer: Self-pay | Admitting: Internal Medicine

## 2023-05-16 ENCOUNTER — Encounter: Payer: Self-pay | Admitting: Internal Medicine

## 2023-05-18 ENCOUNTER — Other Ambulatory Visit (HOSPITAL_COMMUNITY): Payer: Self-pay

## 2023-05-18 MED ORDER — DONEPEZIL HCL 10 MG PO TABS: 10.0000 mg | ORAL_TABLET | Freq: Every morning | ORAL | 3 refills | Status: DC

## 2023-05-18 NOTE — Telephone Encounter (Signed)
This has been done in another message

## 2023-05-19 ENCOUNTER — Other Ambulatory Visit: Payer: Self-pay | Admitting: Internal Medicine

## 2023-05-21 ENCOUNTER — Encounter: Payer: Self-pay | Admitting: Internal Medicine

## 2023-05-21 NOTE — Telephone Encounter (Signed)
Pt called requesting a call back from [place]? Call back # [phone]

## 2023-05-23 ENCOUNTER — Encounter: Payer: Self-pay | Admitting: Internal Medicine

## 2023-05-25 ENCOUNTER — Encounter: Payer: Self-pay | Admitting: Internal Medicine

## 2023-05-25 MED ORDER — ACCU-CHEK SOFTCLIX LANCETS MISC: 3 refills | Status: DC

## 2023-05-31 ENCOUNTER — Encounter: Payer: Self-pay | Admitting: Internal Medicine

## 2023-05-31 NOTE — Addendum Note (Signed)
Addended by: Eual Fines on: 05/31/2023 08:44 PM   Modules accepted: Orders

## 2023-06-01 MED ORDER — TRAMADOL HCL 50 MG PO TABS: 50.0000 mg | ORAL_TABLET | Freq: Three times a day (TID) | ORAL | 0 refills | Status: DC | PRN

## 2023-06-01 NOTE — Addendum Note (Signed)
Addended by: Tillman Abide I on: 06/01/2023 01:48 PM   Modules accepted: Orders

## 2023-06-01 NOTE — Telephone Encounter (Signed)
Its going to mail order so she will have to ask for a refill every 2-3 weeks to make sure they get it to her on time.

## 2023-06-01 NOTE — Telephone Encounter (Signed)
This was last filled 9/3 It is too soon to refill it

## 2023-06-04 DIAGNOSIS — Z981 Arthrodesis status: Secondary | ICD-10-CM | POA: Diagnosis not present

## 2023-06-04 DIAGNOSIS — M4185 Other forms of scoliosis, thoracolumbar region: Secondary | ICD-10-CM | POA: Diagnosis not present

## 2023-06-04 DIAGNOSIS — M5416 Radiculopathy, lumbar region: Secondary | ICD-10-CM | POA: Diagnosis not present

## 2023-06-06 ENCOUNTER — Encounter: Payer: Self-pay | Admitting: Internal Medicine

## 2023-06-08 MED ORDER — GABAPENTIN 300 MG PO CAPS: 900.0000 mg | ORAL_CAPSULE | Freq: Every day | ORAL | 3 refills | Status: DC

## 2023-06-09 ENCOUNTER — Encounter: Payer: Self-pay | Admitting: Internal Medicine

## 2023-06-22 ENCOUNTER — Other Ambulatory Visit: Payer: Self-pay | Admitting: Internal Medicine

## 2023-06-24 ENCOUNTER — Encounter: Payer: Self-pay | Admitting: Internal Medicine

## 2023-06-24 MED ORDER — TRAMADOL HCL 50 MG PO TABS: 50.0000 mg | ORAL_TABLET | Freq: Three times a day (TID) | ORAL | 0 refills | Status: DC | PRN

## 2023-06-24 NOTE — Addendum Note (Signed)
Addended by: Tillman Abide I on: 06/24/2023 04:14 PM   Modules accepted: Orders

## 2023-06-24 NOTE — Telephone Encounter (Signed)
Last refill: 06/01/23 #180 w/ 0 refills  Last OV 05/08/23 Next OV 07/03/23

## 2023-06-29 ENCOUNTER — Encounter: Payer: Self-pay | Admitting: Internal Medicine

## 2023-07-03 ENCOUNTER — Ambulatory Visit (INDEPENDENT_AMBULATORY_CARE_PROVIDER_SITE_OTHER): Payer: 59 | Admitting: Internal Medicine

## 2023-07-03 ENCOUNTER — Encounter: Payer: Self-pay | Admitting: Internal Medicine

## 2023-07-03 VITALS — BP 118/78 | HR 52 | Temp 98.0°F | Ht 60.0 in | Wt 196.0 lb

## 2023-07-03 DIAGNOSIS — Z23 Encounter for immunization: Secondary | ICD-10-CM | POA: Diagnosis not present

## 2023-07-03 DIAGNOSIS — N1832 Chronic kidney disease, stage 3b: Secondary | ICD-10-CM

## 2023-07-03 DIAGNOSIS — F1120 Opioid dependence, uncomplicated: Secondary | ICD-10-CM

## 2023-07-03 DIAGNOSIS — E1121 Type 2 diabetes mellitus with diabetic nephropathy: Secondary | ICD-10-CM | POA: Diagnosis not present

## 2023-07-03 DIAGNOSIS — I5032 Chronic diastolic (congestive) heart failure: Secondary | ICD-10-CM

## 2023-07-03 DIAGNOSIS — M25551 Pain in right hip: Secondary | ICD-10-CM

## 2023-07-03 DIAGNOSIS — I442 Atrioventricular block, complete: Secondary | ICD-10-CM

## 2023-07-03 DIAGNOSIS — G894 Chronic pain syndrome: Secondary | ICD-10-CM

## 2023-07-03 DIAGNOSIS — G3184 Mild cognitive impairment, so stated: Secondary | ICD-10-CM

## 2023-07-03 DIAGNOSIS — Z0000 Encounter for general adult medical examination without abnormal findings: Secondary | ICD-10-CM

## 2023-07-03 LAB — RENAL FUNCTION PANEL
Albumin: 4.1 g/dL (ref 3.5–5.2)
BUN: 31 mg/dL — ABNORMAL HIGH (ref 6–23)
CO2: 28 meq/L (ref 19–32)
Calcium: 9.6 mg/dL (ref 8.4–10.5)
Chloride: 103 meq/L (ref 96–112)
Creatinine, Ser: 1.09 mg/dL (ref 0.40–1.20)
GFR: 49.82 mL/min — ABNORMAL LOW (ref >60.00)
Glucose, Bld: 74 mg/dL (ref 70–99)
Phosphorus: 4.3 mg/dL (ref 2.3–4.6)
Potassium: 4.4 meq/L (ref 3.5–5.1)
Sodium: 142 meq/L (ref 135–145)

## 2023-07-03 LAB — HEPATIC FUNCTION PANEL
ALT: 15 U/L (ref 0–35)
AST: 17 U/L (ref 0–37)
Albumin: 4.1 g/dL (ref 3.5–5.2)
Alkaline Phosphatase: 73 U/L (ref 39–117)
Bilirubin, Direct: 0.1 mg/dL (ref 0.0–0.3)
Total Bilirubin: 0.4 mg/dL (ref 0.2–1.2)
Total Protein: 6.6 g/dL (ref 6.0–8.3)

## 2023-07-03 LAB — HEMOGLOBIN A1C: Hgb A1c MFr Bld: 6.3 % (ref 4.6–6.5)

## 2023-07-03 LAB — LIPID PANEL
Cholesterol: 152 mg/dL (ref 0–200)
HDL: 75.4 mg/dL (ref >39.00)
LDL Cholesterol: 52 mg/dL (ref 0–99)
NonHDL: 76.94
Total CHOL/HDL Ratio: 2
Triglycerides: 123 mg/dL (ref 0.0–149.0)
VLDL: 24.6 mg/dL (ref 0.0–40.0)

## 2023-07-03 LAB — CBC
HCT: 45.9 % (ref 36.0–46.0)
Hemoglobin: 14.7 g/dL (ref 12.0–15.0)
MCHC: 32.1 g/dL (ref 30.0–36.0)
MCV: 98.4 fL (ref 78.0–100.0)
Platelets: 297 10*3/uL (ref 150.0–400.0)
RBC: 4.67 Mil/uL (ref 3.87–5.11)
RDW: 14 % (ref 11.5–15.5)
WBC: 9.9 10*3/uL (ref 4.0–10.5)

## 2023-07-03 LAB — HM DIABETES FOOT EXAM

## 2023-07-03 LAB — VITAMIN D 25 HYDROXY (VIT D DEFICIENCY, FRACTURES): VITD: 32.32 ng/mL (ref 30.00–100.00)

## 2023-07-03 LAB — MICROALBUMIN / CREATININE URINE RATIO
Creatinine,U: 153.6 mg/dL
Microalb Creat Ratio: 1 mg/g (ref 0.0–30.0)
Microalb, Ur: 1.6 mg/dL (ref 0.0–1.9)

## 2023-07-03 LAB — TSH: TSH: 1.68 u[IU]/mL (ref 0.35–5.50)

## 2023-07-03 NOTE — Progress Notes (Signed)
Hearing Screening - Comments:: Passed whisper test Vision Screening - Comments:: May 2024  

## 2023-07-03 NOTE — Progress Notes (Signed)
Subjective:    Patient ID: [name], female    DOB: 02-02-1946, 77 y.o.   MRN: [id number]  HPI Here for Medicare wellness visit and follow up of chronic health conditions Reviewed advanced directives Reviewed other doctors---Dr Shah--neurology, Dr Baldwin [facility] management (done there), Dr Collier [place], Dr Beckey Rutter, Dr Paraschos--cardiology, Dr Sinda Du, [facility], Drs Singh/Kolluru-nephrology No hospitalizations or surgery in the past year Vision is okay Hearing is fair No alcohol or tobacco No falls Trouble with instrumental ADLs---shops on line, goes to [employer]. Great difficulty doing any housework--has had some help Mild memory issues  Has trouble sleeping at times Mostly due to the pain Hasn't tried melatonin Takes tramadol 100 at 5:30AM, then 10:30 and then 5:30PM  Still has some tremors At rest mostly---some with intention as well  Gets rectal urgency at times--not liquid Generally continent Thinks she has a fairly good diet  Has been on donepezil from Dr Sherryll Burger Not clear it has made any difference  Reviewed labs--last GFR stable at 45.9  Some depressed mood--comes and goes Limited in what she can do--trouble getting around Wishes she was dead when her pain is bad--no thoughts of suicide Enjoys some things  Checks sugars twice a week---usually under 100 No foot numbness or burning  No chest pain or SOB No palpitations No dizziness or syncope Ankles stay puffy--no worsening Doesn't weigh often  Current Outpatient Medications on File Prior to Visit  Medication Sig Dispense Refill   Accu-Chek Softclix Lancets lancets Use as instructed 100 each 3   amLODipine (NORVASC) 5 MG tablet Take 5 mg by mouth daily.     ARIPiprazole (ABILIFY) 2 MG tablet TAKE 1 TABLET BY MOUTH DAILY AT  BEDTIME 90 tablet 3   aspirin EC 81 MG tablet Take 81 mg by mouth 3 (three) times a week.     atorvastatin (LIPITOR) 80 MG tablet TAKE 1 TABLET BY MOUTH ONCE   DAILY 100 tablet 3   donepezil (ARICEPT) 10 MG tablet Take 1 tablet (10 mg total) by mouth every morning. 90 tablet 3   DULoxetine (CYMBALTA) 60 MG capsule TAKE 1 CAPSULE BY MOUTH DAILY 90 capsule 3   gabapentin (NEURONTIN) 300 MG capsule Take 3 capsules (900 mg total) by mouth at bedtime. 300 capsule 3   lisinopril-hydrochlorothiazide (ZESTORETIC) 20-12.5 MG tablet Take 2 tablets by mouth daily. 180 tablet 3   Multiple Vitamins-Minerals (MULTIVITAMIN WITH MINERALS) tablet Take 1 tablet by mouth daily.      ONETOUCH VERIO test strip USE TO CHECK BLOOD SUGAR ONCE A DAY DX CODE E11.21 100 strip 12   traMADol (ULTRAM) 50 MG tablet Take 1-2 tablets (50-100 mg total) by mouth every 8 (eight) hours as needed. 180 tablet 0   No current facility-administered medications on file prior to visit.    No Known Allergies  Past Medical History:  Diagnosis Date   Arthritis    Confusion    Diabetes mellitus without complication (HCC)    type 2   Hepatitis    pt. denies   Hypertension    Lumbar stenosis    Memory loss    Nocturia    Numbness and tingling    legs and feet   Wears glasses     Past Surgical History:  Procedure Laterality Date   BACK SURGERY     CESAREAN SECTION  1978   COLONOSCOPY     HEMORRHOID SURGERY  2015   JOINT REPLACEMENT     01-08-18 Dr. Maureen Ralphs  PACEMAKER LEADLESS INSERTION N/A 12/06/2021   Procedure: PACEMAKER LEADLESS INSERTION;  Surgeon: Marcina Millard, MD;  Location: [facility];  Service: Cardiovascular;  Laterality: N/A;   TOTAL HIP ARTHROPLASTY Left 01/08/2018   Procedure: LEFT TOTAL HIP ARTHROPLASTY ANTERIOR APPROACH;  Surgeon: Kathryne Hitch, MD;  Location: [facility];  Service: Orthopedics;  Laterality: Left;   TRANSFORAMINAL LUMBAR INTERBODY FUSION (TLIF) WITH PEDICLE SCREW FIXATION 2 LEVEL N/A 11/07/2015   Procedure: Lumbar three-four Lumbar four-five  transforaminal lumbar interbody fusion with interbody prosthesis posterior lateral  arthrodesis and posterior segmental instrumentation;  Surgeon: Tressie Stalker, MD;  Location: [facility];  Service: Neurosurgery;  Laterality: N/A;    Family History  Problem Relation Age of Onset   Hypertension Other    Cancer Other        ovarian   Cancer Mother    Heart disease Father     Social History   Socioeconomic History   Marital status: Divorced    Spouse name: Not on file   Number of children: 4   Years of education: Not on file   Highest education level: Associate degree: occupational, Scientist, product/process development, or vocational program  Occupational History   Not on file  Tobacco Use   Smoking status: Never    Passive exposure: Never   Smokeless tobacco: Never  Vaping Use   Vaping status: Never Used  Substance and Sexual Activity   Alcohol use: No    Comment: rarely   Drug use: No   Sexual activity: Not Currently  Other Topics Concern   Not on file  Social History Narrative   Has a living will.   Son is health care POA   Would accept resuscitation.     Would accept life support    No tube feeds if cognitively unaware   Social Determinants of Health   Financial Resource Strain: Low Risk  (12/15/2022)   Overall Financial Resource Strain (CARDIA)    Difficulty of Paying Living Expenses: Not hard at all  Food Insecurity: Food Insecurity Present (12/15/2022)   Hunger Vital Sign    Worried About Running Out of Food in the Last Year: Sometimes true    Ran Out of Food in the Last Year: Never true  Transportation Needs: No Transportation Needs (12/15/2022)   PRAPARE - Administrator, Civil Service (Medical): No    Lack of Transportation (Non-Medical): No  Physical Activity: Unknown (12/15/2022)   Exercise Vital Sign    Days of Exercise per Week: 0 days    Minutes of Exercise per Session: Not on file  Stress: No Stress Concern Present (12/15/2022)   [employer] of Occupational Health - Occupational Stress Questionnaire    Feeling of Stress : Only a little   Social Connections: Socially Isolated (12/15/2022)   Social Connection and Isolation Panel [NHANES]    Frequency of Communication with Friends and Family: Three times a week    Frequency of Social Gatherings with Friends and Family: Once a week    Attends Religious Services: Never    Database administrator or Organizations: No    Attends Engineer, structural: Not on file    Marital Status: Divorced  Intimate Partner Violence: Not At Risk (11/20/2017)   Humiliation, Afraid, Rape, and Kick questionnaire    Fear of Current or Ex-Partner: No    Emotionally Abused: No    Physically Abused: No    Sexually Abused: No   Review of Systems Appetite is okay  Weight stable Sleep problems Wears seat belt generally Teeth okay---keeps up with dentist No heartburn or dysphagia Voids okay No suspicious skin lesions    Objective:   Physical Exam Constitutional:      Appearance: Normal appearance.  HENT:     Mouth/Throat:     Pharynx: No oropharyngeal exudate or posterior oropharyngeal erythema.  Eyes:     Conjunctiva/sclera: Conjunctivae normal.     Pupils: Pupils are equal, round, and reactive to light.  Cardiovascular:     Rate and Rhythm: Normal rate and regular rhythm.     Heart sounds: No murmur heard.    No gallop.     Comments: Faint pedal pulses Pulmonary:     Effort: Pulmonary effort is normal.     Breath sounds: Normal breath sounds. No wheezing or rales.  Abdominal:     Palpations: Abdomen is soft.     Tenderness: There is no abdominal tenderness.  Musculoskeletal:     Cervical back: Neck supple.     Comments: Mild pedal edema---dependent rubor  Lymphadenopathy:     Cervical: No cervical adenopathy.  Skin:    Findings: No rash.  Neurological:     General: No focal deficit present.     Mental Status: She is alert and oriented to person, place, and time.     Comments: Normal sensation in feet  Psychiatric:        Mood and Affect: Mood normal.        Behavior:  Behavior normal.            Assessment & Plan:

## 2023-07-03 NOTE — Assessment & Plan Note (Signed)
Seems to be okay with no Rx Will check labs

## 2023-07-03 NOTE — Assessment & Plan Note (Signed)
No concerns on the PDMP

## 2023-07-03 NOTE — Assessment & Plan Note (Signed)
Try lidocaine patch.

## 2023-07-03 NOTE — Assessment & Plan Note (Signed)
Mostly from spinal stenosis Retains most function with the tramadol 50mg  --- 6 per day Told her to try to spread them out more

## 2023-07-03 NOTE — Assessment & Plan Note (Signed)
I have personally reviewed the Medicare Annual Wellness questionnaire and have noted 1. The patient's medical and social history 2. Their use of alcohol, tobacco or illicit drugs 3. Their current medications and supplements 4. The patient's functional ability including ADL's, fall risks, home safety risks and hearing or visual             impairment. 5. Diet and physical activities 6. Evidence for depression or mood disorders  The patients weight, height, BMI and visual acuity have been recorded in the chart I have made referrals, counseling and provided education to the patient based review of the above and I have provided the pt with a written personalized care plan for preventive services.  I have provided you with a copy of your personalized plan for preventive services. Please take the time to review along with your updated medication list.  Still prefers no more colonoscopies or mammograms Nor really able to exercise due to back/chronic pain Flu vaccine today COVID and RSV vaccines at the pharmacy

## 2023-07-03 NOTE — Assessment & Plan Note (Signed)
No fluid overload On the lisinopril/hydrochlorothiazide and amlodipine for BP

## 2023-07-03 NOTE — Patient Instructions (Addendum)
Please try melatonin for sleep when needed---you can start with 3mg  but can increase to 6mg  or even 9mg  as needed.  You can try off the donepezil to see if it helps your bowels.  Try a lidocaine patch on your hip to see if it helps the pain.  Please get the updated COVID vaccine and the one time RSV vaccine

## 2023-07-03 NOTE — Assessment & Plan Note (Signed)
Slightly better the last time Will recheck Is on lisinopril/hydrochlorothiazide 40/25

## 2023-07-03 NOTE — Assessment & Plan Note (Signed)
Had fecal urgency---may be from donepezil which hasn't clearly helped Told her to try off it

## 2023-07-03 NOTE — Assessment & Plan Note (Signed)
No problems with pacemaker Checks it every 6 months

## 2023-07-04 LAB — PARATHYROID HORMONE, INTACT (NO CA): PTH: 80 pg/mL — ABNORMAL HIGH (ref 16–77)

## 2023-07-06 ENCOUNTER — Encounter: Payer: Self-pay | Admitting: Internal Medicine

## 2023-07-07 ENCOUNTER — Encounter: Payer: Self-pay | Admitting: Internal Medicine

## 2023-07-16 ENCOUNTER — Other Ambulatory Visit: Payer: Self-pay | Admitting: Internal Medicine

## 2023-07-25 ENCOUNTER — Encounter: Payer: Self-pay | Admitting: Internal Medicine

## 2023-07-27 MED ORDER — TRAMADOL HCL 50 MG PO TABS: 50.0000 mg | ORAL_TABLET | Freq: Three times a day (TID) | ORAL | 0 refills | Status: DC | PRN

## 2023-08-09 ENCOUNTER — Encounter: Payer: Self-pay | Admitting: Internal Medicine

## 2023-09-11 ENCOUNTER — Encounter: Payer: Self-pay | Admitting: Internal Medicine

## 2023-09-11 MED ORDER — TRAMADOL HCL 50 MG PO TABS: 50.0000 mg | ORAL_TABLET | Freq: Three times a day (TID) | ORAL | 0 refills | Status: DC | PRN

## 2023-09-11 NOTE — Telephone Encounter (Signed)
 It looks like we did not get a request in December for the tramadol.

## 2023-09-22 DIAGNOSIS — Z8673 Personal history of transient ischemic attack (TIA), and cerebral infarction without residual deficits: Secondary | ICD-10-CM | POA: Diagnosis not present

## 2023-09-22 DIAGNOSIS — R251 Tremor, unspecified: Secondary | ICD-10-CM | POA: Diagnosis not present

## 2023-09-22 DIAGNOSIS — R3915 Urgency of urination: Secondary | ICD-10-CM | POA: Diagnosis not present

## 2023-09-23 MED ORDER — TRAMADOL HCL 50 MG PO TABS: 50.0000 mg | ORAL_TABLET | Freq: Three times a day (TID) | ORAL | 0 refills | Status: DC | PRN

## 2023-09-23 MED ORDER — GABAPENTIN 300 MG PO CAPS: 600.0000 mg | ORAL_CAPSULE | Freq: Two times a day (BID) | ORAL | 1 refills | Status: AC

## 2023-09-23 NOTE — Addendum Note (Signed)
 Addended by: Curt Dover I on: 09/23/2023 08:41 AM   Modules accepted: Orders

## 2023-09-24 DIAGNOSIS — Z8673 Personal history of transient ischemic attack (TIA), and cerebral infarction without residual deficits: Secondary | ICD-10-CM | POA: Diagnosis not present

## 2023-09-24 DIAGNOSIS — E1122 Type 2 diabetes mellitus with diabetic chronic kidney disease: Secondary | ICD-10-CM | POA: Diagnosis not present

## 2023-09-24 DIAGNOSIS — M1611 Unilateral primary osteoarthritis, right hip: Secondary | ICD-10-CM | POA: Diagnosis not present

## 2023-09-24 DIAGNOSIS — M48061 Spinal stenosis, lumbar region without neurogenic claudication: Secondary | ICD-10-CM | POA: Diagnosis not present

## 2023-09-24 DIAGNOSIS — E1142 Type 2 diabetes mellitus with diabetic polyneuropathy: Secondary | ICD-10-CM | POA: Diagnosis not present

## 2023-09-24 DIAGNOSIS — G8929 Other chronic pain: Secondary | ICD-10-CM | POA: Diagnosis not present

## 2023-09-24 DIAGNOSIS — K589 Irritable bowel syndrome without diarrhea: Secondary | ICD-10-CM | POA: Diagnosis not present

## 2023-09-24 DIAGNOSIS — N189 Chronic kidney disease, unspecified: Secondary | ICD-10-CM | POA: Diagnosis not present

## 2023-09-24 DIAGNOSIS — Z981 Arthrodesis status: Secondary | ICD-10-CM | POA: Diagnosis not present

## 2023-09-24 DIAGNOSIS — I129 Hypertensive chronic kidney disease with stage 1 through stage 4 chronic kidney disease, or unspecified chronic kidney disease: Secondary | ICD-10-CM | POA: Diagnosis not present

## 2023-09-24 DIAGNOSIS — Z96642 Presence of left artificial hip joint: Secondary | ICD-10-CM | POA: Diagnosis not present

## 2023-09-24 DIAGNOSIS — M51369 Other intervertebral disc degeneration, lumbar region without mention of lumbar back pain or lower extremity pain: Secondary | ICD-10-CM | POA: Diagnosis not present

## 2023-09-24 DIAGNOSIS — R251 Tremor, unspecified: Secondary | ICD-10-CM | POA: Diagnosis not present

## 2023-09-30 DIAGNOSIS — R251 Tremor, unspecified: Secondary | ICD-10-CM | POA: Diagnosis not present

## 2023-09-30 DIAGNOSIS — G8929 Other chronic pain: Secondary | ICD-10-CM | POA: Diagnosis not present

## 2023-09-30 DIAGNOSIS — Z8673 Personal history of transient ischemic attack (TIA), and cerebral infarction without residual deficits: Secondary | ICD-10-CM | POA: Diagnosis not present

## 2023-09-30 DIAGNOSIS — M51369 Other intervertebral disc degeneration, lumbar region without mention of lumbar back pain or lower extremity pain: Secondary | ICD-10-CM | POA: Diagnosis not present

## 2023-09-30 DIAGNOSIS — N189 Chronic kidney disease, unspecified: Secondary | ICD-10-CM | POA: Diagnosis not present

## 2023-09-30 DIAGNOSIS — K589 Irritable bowel syndrome without diarrhea: Secondary | ICD-10-CM | POA: Diagnosis not present

## 2023-09-30 DIAGNOSIS — M1611 Unilateral primary osteoarthritis, right hip: Secondary | ICD-10-CM | POA: Diagnosis not present

## 2023-09-30 DIAGNOSIS — E1122 Type 2 diabetes mellitus with diabetic chronic kidney disease: Secondary | ICD-10-CM | POA: Diagnosis not present

## 2023-09-30 DIAGNOSIS — Z96642 Presence of left artificial hip joint: Secondary | ICD-10-CM | POA: Diagnosis not present

## 2023-09-30 DIAGNOSIS — Z981 Arthrodesis status: Secondary | ICD-10-CM | POA: Diagnosis not present

## 2023-09-30 DIAGNOSIS — M48061 Spinal stenosis, lumbar region without neurogenic claudication: Secondary | ICD-10-CM | POA: Diagnosis not present

## 2023-09-30 DIAGNOSIS — I129 Hypertensive chronic kidney disease with stage 1 through stage 4 chronic kidney disease, or unspecified chronic kidney disease: Secondary | ICD-10-CM | POA: Diagnosis not present

## 2023-09-30 DIAGNOSIS — E1142 Type 2 diabetes mellitus with diabetic polyneuropathy: Secondary | ICD-10-CM | POA: Diagnosis not present

## 2023-10-01 DIAGNOSIS — M51369 Other intervertebral disc degeneration, lumbar region without mention of lumbar back pain or lower extremity pain: Secondary | ICD-10-CM | POA: Diagnosis not present

## 2023-10-01 DIAGNOSIS — M1611 Unilateral primary osteoarthritis, right hip: Secondary | ICD-10-CM | POA: Diagnosis not present

## 2023-10-01 DIAGNOSIS — E1142 Type 2 diabetes mellitus with diabetic polyneuropathy: Secondary | ICD-10-CM | POA: Diagnosis not present

## 2023-10-01 DIAGNOSIS — R251 Tremor, unspecified: Secondary | ICD-10-CM | POA: Diagnosis not present

## 2023-10-01 DIAGNOSIS — Z96642 Presence of left artificial hip joint: Secondary | ICD-10-CM | POA: Diagnosis not present

## 2023-10-01 DIAGNOSIS — I129 Hypertensive chronic kidney disease with stage 1 through stage 4 chronic kidney disease, or unspecified chronic kidney disease: Secondary | ICD-10-CM | POA: Diagnosis not present

## 2023-10-01 DIAGNOSIS — Z8673 Personal history of transient ischemic attack (TIA), and cerebral infarction without residual deficits: Secondary | ICD-10-CM | POA: Diagnosis not present

## 2023-10-01 DIAGNOSIS — E1122 Type 2 diabetes mellitus with diabetic chronic kidney disease: Secondary | ICD-10-CM | POA: Diagnosis not present

## 2023-10-01 DIAGNOSIS — N189 Chronic kidney disease, unspecified: Secondary | ICD-10-CM | POA: Diagnosis not present

## 2023-10-01 DIAGNOSIS — M48061 Spinal stenosis, lumbar region without neurogenic claudication: Secondary | ICD-10-CM | POA: Diagnosis not present

## 2023-10-01 DIAGNOSIS — K589 Irritable bowel syndrome without diarrhea: Secondary | ICD-10-CM | POA: Diagnosis not present

## 2023-10-01 DIAGNOSIS — G8929 Other chronic pain: Secondary | ICD-10-CM | POA: Diagnosis not present

## 2023-10-01 DIAGNOSIS — Z981 Arthrodesis status: Secondary | ICD-10-CM | POA: Diagnosis not present

## 2023-10-02 DIAGNOSIS — N189 Chronic kidney disease, unspecified: Secondary | ICD-10-CM | POA: Diagnosis not present

## 2023-10-02 DIAGNOSIS — E1142 Type 2 diabetes mellitus with diabetic polyneuropathy: Secondary | ICD-10-CM | POA: Diagnosis not present

## 2023-10-02 DIAGNOSIS — E1122 Type 2 diabetes mellitus with diabetic chronic kidney disease: Secondary | ICD-10-CM | POA: Diagnosis not present

## 2023-10-02 DIAGNOSIS — R251 Tremor, unspecified: Secondary | ICD-10-CM | POA: Diagnosis not present

## 2023-10-02 DIAGNOSIS — I129 Hypertensive chronic kidney disease with stage 1 through stage 4 chronic kidney disease, or unspecified chronic kidney disease: Secondary | ICD-10-CM | POA: Diagnosis not present

## 2023-10-06 DIAGNOSIS — R251 Tremor, unspecified: Secondary | ICD-10-CM | POA: Diagnosis not present

## 2023-10-06 DIAGNOSIS — E1122 Type 2 diabetes mellitus with diabetic chronic kidney disease: Secondary | ICD-10-CM | POA: Diagnosis not present

## 2023-10-06 DIAGNOSIS — I129 Hypertensive chronic kidney disease with stage 1 through stage 4 chronic kidney disease, or unspecified chronic kidney disease: Secondary | ICD-10-CM | POA: Diagnosis not present

## 2023-10-06 DIAGNOSIS — E1142 Type 2 diabetes mellitus with diabetic polyneuropathy: Secondary | ICD-10-CM | POA: Diagnosis not present

## 2023-10-06 DIAGNOSIS — M51369 Other intervertebral disc degeneration, lumbar region without mention of lumbar back pain or lower extremity pain: Secondary | ICD-10-CM | POA: Diagnosis not present

## 2023-10-06 DIAGNOSIS — Z8673 Personal history of transient ischemic attack (TIA), and cerebral infarction without residual deficits: Secondary | ICD-10-CM | POA: Diagnosis not present

## 2023-10-06 DIAGNOSIS — G8929 Other chronic pain: Secondary | ICD-10-CM | POA: Diagnosis not present

## 2023-10-06 DIAGNOSIS — K589 Irritable bowel syndrome without diarrhea: Secondary | ICD-10-CM | POA: Diagnosis not present

## 2023-10-06 DIAGNOSIS — Z981 Arthrodesis status: Secondary | ICD-10-CM | POA: Diagnosis not present

## 2023-10-06 DIAGNOSIS — Z96642 Presence of left artificial hip joint: Secondary | ICD-10-CM | POA: Diagnosis not present

## 2023-10-06 DIAGNOSIS — M1611 Unilateral primary osteoarthritis, right hip: Secondary | ICD-10-CM | POA: Diagnosis not present

## 2023-10-06 DIAGNOSIS — M48061 Spinal stenosis, lumbar region without neurogenic claudication: Secondary | ICD-10-CM | POA: Diagnosis not present

## 2023-10-06 DIAGNOSIS — N189 Chronic kidney disease, unspecified: Secondary | ICD-10-CM | POA: Diagnosis not present

## 2023-10-07 DIAGNOSIS — N189 Chronic kidney disease, unspecified: Secondary | ICD-10-CM | POA: Diagnosis not present

## 2023-10-07 DIAGNOSIS — E1122 Type 2 diabetes mellitus with diabetic chronic kidney disease: Secondary | ICD-10-CM | POA: Diagnosis not present

## 2023-10-07 DIAGNOSIS — E1142 Type 2 diabetes mellitus with diabetic polyneuropathy: Secondary | ICD-10-CM | POA: Diagnosis not present

## 2023-10-07 DIAGNOSIS — M1611 Unilateral primary osteoarthritis, right hip: Secondary | ICD-10-CM | POA: Diagnosis not present

## 2023-10-07 DIAGNOSIS — Z8673 Personal history of transient ischemic attack (TIA), and cerebral infarction without residual deficits: Secondary | ICD-10-CM | POA: Diagnosis not present

## 2023-10-07 DIAGNOSIS — K589 Irritable bowel syndrome without diarrhea: Secondary | ICD-10-CM | POA: Diagnosis not present

## 2023-10-07 DIAGNOSIS — G8929 Other chronic pain: Secondary | ICD-10-CM | POA: Diagnosis not present

## 2023-10-07 DIAGNOSIS — M48061 Spinal stenosis, lumbar region without neurogenic claudication: Secondary | ICD-10-CM | POA: Diagnosis not present

## 2023-10-07 DIAGNOSIS — Z981 Arthrodesis status: Secondary | ICD-10-CM | POA: Diagnosis not present

## 2023-10-07 DIAGNOSIS — R251 Tremor, unspecified: Secondary | ICD-10-CM | POA: Diagnosis not present

## 2023-10-07 DIAGNOSIS — M51369 Other intervertebral disc degeneration, lumbar region without mention of lumbar back pain or lower extremity pain: Secondary | ICD-10-CM | POA: Diagnosis not present

## 2023-10-07 DIAGNOSIS — I129 Hypertensive chronic kidney disease with stage 1 through stage 4 chronic kidney disease, or unspecified chronic kidney disease: Secondary | ICD-10-CM | POA: Diagnosis not present

## 2023-10-07 DIAGNOSIS — Z96642 Presence of left artificial hip joint: Secondary | ICD-10-CM | POA: Diagnosis not present

## 2023-10-11 ENCOUNTER — Encounter: Payer: Self-pay | Admitting: Internal Medicine

## 2023-10-12 DIAGNOSIS — I129 Hypertensive chronic kidney disease with stage 1 through stage 4 chronic kidney disease, or unspecified chronic kidney disease: Secondary | ICD-10-CM | POA: Diagnosis not present

## 2023-10-12 DIAGNOSIS — N189 Chronic kidney disease, unspecified: Secondary | ICD-10-CM | POA: Diagnosis not present

## 2023-10-12 DIAGNOSIS — M51369 Other intervertebral disc degeneration, lumbar region without mention of lumbar back pain or lower extremity pain: Secondary | ICD-10-CM | POA: Diagnosis not present

## 2023-10-12 DIAGNOSIS — M48061 Spinal stenosis, lumbar region without neurogenic claudication: Secondary | ICD-10-CM | POA: Diagnosis not present

## 2023-10-12 DIAGNOSIS — R251 Tremor, unspecified: Secondary | ICD-10-CM | POA: Diagnosis not present

## 2023-10-12 DIAGNOSIS — M1611 Unilateral primary osteoarthritis, right hip: Secondary | ICD-10-CM | POA: Diagnosis not present

## 2023-10-12 DIAGNOSIS — Z8673 Personal history of transient ischemic attack (TIA), and cerebral infarction without residual deficits: Secondary | ICD-10-CM | POA: Diagnosis not present

## 2023-10-12 DIAGNOSIS — E1122 Type 2 diabetes mellitus with diabetic chronic kidney disease: Secondary | ICD-10-CM | POA: Diagnosis not present

## 2023-10-12 DIAGNOSIS — Z96642 Presence of left artificial hip joint: Secondary | ICD-10-CM | POA: Diagnosis not present

## 2023-10-12 DIAGNOSIS — K589 Irritable bowel syndrome without diarrhea: Secondary | ICD-10-CM | POA: Diagnosis not present

## 2023-10-12 DIAGNOSIS — E1142 Type 2 diabetes mellitus with diabetic polyneuropathy: Secondary | ICD-10-CM | POA: Diagnosis not present

## 2023-10-12 DIAGNOSIS — Z981 Arthrodesis status: Secondary | ICD-10-CM | POA: Diagnosis not present

## 2023-10-12 DIAGNOSIS — G8929 Other chronic pain: Secondary | ICD-10-CM | POA: Diagnosis not present

## 2023-10-14 DIAGNOSIS — I10 Essential (primary) hypertension: Secondary | ICD-10-CM | POA: Diagnosis not present

## 2023-10-14 DIAGNOSIS — I499 Cardiac arrhythmia, unspecified: Secondary | ICD-10-CM | POA: Diagnosis not present

## 2023-10-14 DIAGNOSIS — N1831 Chronic kidney disease, stage 3a: Secondary | ICD-10-CM | POA: Diagnosis not present

## 2023-10-14 DIAGNOSIS — I452 Bifascicular block: Secondary | ICD-10-CM | POA: Diagnosis not present

## 2023-10-14 DIAGNOSIS — Z8673 Personal history of transient ischemic attack (TIA), and cerebral infarction without residual deficits: Secondary | ICD-10-CM | POA: Diagnosis not present

## 2023-10-14 DIAGNOSIS — R9431 Abnormal electrocardiogram [ECG] [EKG]: Secondary | ICD-10-CM | POA: Diagnosis not present

## 2023-10-14 DIAGNOSIS — Z950 Presence of cardiac pacemaker: Secondary | ICD-10-CM | POA: Diagnosis not present

## 2023-10-14 DIAGNOSIS — I872 Venous insufficiency (chronic) (peripheral): Secondary | ICD-10-CM | POA: Diagnosis not present

## 2023-10-14 DIAGNOSIS — I442 Atrioventricular block, complete: Secondary | ICD-10-CM | POA: Diagnosis not present

## 2023-10-19 DIAGNOSIS — M51369 Other intervertebral disc degeneration, lumbar region without mention of lumbar back pain or lower extremity pain: Secondary | ICD-10-CM | POA: Diagnosis not present

## 2023-10-19 DIAGNOSIS — M48061 Spinal stenosis, lumbar region without neurogenic claudication: Secondary | ICD-10-CM | POA: Diagnosis not present

## 2023-10-19 DIAGNOSIS — M1611 Unilateral primary osteoarthritis, right hip: Secondary | ICD-10-CM | POA: Diagnosis not present

## 2023-10-19 DIAGNOSIS — N189 Chronic kidney disease, unspecified: Secondary | ICD-10-CM | POA: Diagnosis not present

## 2023-10-19 DIAGNOSIS — Z96642 Presence of left artificial hip joint: Secondary | ICD-10-CM | POA: Diagnosis not present

## 2023-10-19 DIAGNOSIS — E1122 Type 2 diabetes mellitus with diabetic chronic kidney disease: Secondary | ICD-10-CM | POA: Diagnosis not present

## 2023-10-19 DIAGNOSIS — K589 Irritable bowel syndrome without diarrhea: Secondary | ICD-10-CM | POA: Diagnosis not present

## 2023-10-19 DIAGNOSIS — G8929 Other chronic pain: Secondary | ICD-10-CM | POA: Diagnosis not present

## 2023-10-19 DIAGNOSIS — Z8673 Personal history of transient ischemic attack (TIA), and cerebral infarction without residual deficits: Secondary | ICD-10-CM | POA: Diagnosis not present

## 2023-10-19 DIAGNOSIS — R251 Tremor, unspecified: Secondary | ICD-10-CM | POA: Diagnosis not present

## 2023-10-19 DIAGNOSIS — E1142 Type 2 diabetes mellitus with diabetic polyneuropathy: Secondary | ICD-10-CM | POA: Diagnosis not present

## 2023-10-19 DIAGNOSIS — Z981 Arthrodesis status: Secondary | ICD-10-CM | POA: Diagnosis not present

## 2023-10-19 DIAGNOSIS — I129 Hypertensive chronic kidney disease with stage 1 through stage 4 chronic kidney disease, or unspecified chronic kidney disease: Secondary | ICD-10-CM | POA: Diagnosis not present

## 2023-10-26 DIAGNOSIS — E1142 Type 2 diabetes mellitus with diabetic polyneuropathy: Secondary | ICD-10-CM | POA: Diagnosis not present

## 2023-10-26 DIAGNOSIS — K589 Irritable bowel syndrome without diarrhea: Secondary | ICD-10-CM | POA: Diagnosis not present

## 2023-10-26 DIAGNOSIS — G8929 Other chronic pain: Secondary | ICD-10-CM | POA: Diagnosis not present

## 2023-10-26 DIAGNOSIS — R251 Tremor, unspecified: Secondary | ICD-10-CM | POA: Diagnosis not present

## 2023-10-26 DIAGNOSIS — M48061 Spinal stenosis, lumbar region without neurogenic claudication: Secondary | ICD-10-CM | POA: Diagnosis not present

## 2023-10-26 DIAGNOSIS — E1122 Type 2 diabetes mellitus with diabetic chronic kidney disease: Secondary | ICD-10-CM | POA: Diagnosis not present

## 2023-10-26 DIAGNOSIS — Z981 Arthrodesis status: Secondary | ICD-10-CM | POA: Diagnosis not present

## 2023-10-26 DIAGNOSIS — M1611 Unilateral primary osteoarthritis, right hip: Secondary | ICD-10-CM | POA: Diagnosis not present

## 2023-10-26 DIAGNOSIS — Z8673 Personal history of transient ischemic attack (TIA), and cerebral infarction without residual deficits: Secondary | ICD-10-CM | POA: Diagnosis not present

## 2023-10-26 DIAGNOSIS — I129 Hypertensive chronic kidney disease with stage 1 through stage 4 chronic kidney disease, or unspecified chronic kidney disease: Secondary | ICD-10-CM | POA: Diagnosis not present

## 2023-10-26 DIAGNOSIS — N189 Chronic kidney disease, unspecified: Secondary | ICD-10-CM | POA: Diagnosis not present

## 2023-10-26 DIAGNOSIS — Z96642 Presence of left artificial hip joint: Secondary | ICD-10-CM | POA: Diagnosis not present

## 2023-10-26 DIAGNOSIS — M51369 Other intervertebral disc degeneration, lumbar region without mention of lumbar back pain or lower extremity pain: Secondary | ICD-10-CM | POA: Diagnosis not present

## 2023-10-30 DIAGNOSIS — K589 Irritable bowel syndrome without diarrhea: Secondary | ICD-10-CM | POA: Diagnosis not present

## 2023-10-30 DIAGNOSIS — N189 Chronic kidney disease, unspecified: Secondary | ICD-10-CM | POA: Diagnosis not present

## 2023-10-30 DIAGNOSIS — Z981 Arthrodesis status: Secondary | ICD-10-CM | POA: Diagnosis not present

## 2023-10-30 DIAGNOSIS — M48061 Spinal stenosis, lumbar region without neurogenic claudication: Secondary | ICD-10-CM | POA: Diagnosis not present

## 2023-10-30 DIAGNOSIS — I129 Hypertensive chronic kidney disease with stage 1 through stage 4 chronic kidney disease, or unspecified chronic kidney disease: Secondary | ICD-10-CM | POA: Diagnosis not present

## 2023-10-30 DIAGNOSIS — M1611 Unilateral primary osteoarthritis, right hip: Secondary | ICD-10-CM | POA: Diagnosis not present

## 2023-10-30 DIAGNOSIS — G8929 Other chronic pain: Secondary | ICD-10-CM | POA: Diagnosis not present

## 2023-10-30 DIAGNOSIS — Z96642 Presence of left artificial hip joint: Secondary | ICD-10-CM | POA: Diagnosis not present

## 2023-10-30 DIAGNOSIS — M51369 Other intervertebral disc degeneration, lumbar region without mention of lumbar back pain or lower extremity pain: Secondary | ICD-10-CM | POA: Diagnosis not present

## 2023-10-30 DIAGNOSIS — Z8673 Personal history of transient ischemic attack (TIA), and cerebral infarction without residual deficits: Secondary | ICD-10-CM | POA: Diagnosis not present

## 2023-10-30 DIAGNOSIS — E1142 Type 2 diabetes mellitus with diabetic polyneuropathy: Secondary | ICD-10-CM | POA: Diagnosis not present

## 2023-10-30 DIAGNOSIS — E1122 Type 2 diabetes mellitus with diabetic chronic kidney disease: Secondary | ICD-10-CM | POA: Diagnosis not present

## 2023-10-30 DIAGNOSIS — R251 Tremor, unspecified: Secondary | ICD-10-CM | POA: Diagnosis not present

## 2023-10-31 ENCOUNTER — Encounter: Payer: Self-pay | Admitting: Internal Medicine

## 2023-11-02 DIAGNOSIS — I493 Ventricular premature depolarization: Secondary | ICD-10-CM | POA: Diagnosis not present

## 2023-11-02 DIAGNOSIS — I499 Cardiac arrhythmia, unspecified: Secondary | ICD-10-CM | POA: Diagnosis not present

## 2023-11-03 DIAGNOSIS — N189 Chronic kidney disease, unspecified: Secondary | ICD-10-CM | POA: Diagnosis not present

## 2023-11-03 DIAGNOSIS — E1122 Type 2 diabetes mellitus with diabetic chronic kidney disease: Secondary | ICD-10-CM | POA: Diagnosis not present

## 2023-11-03 DIAGNOSIS — I129 Hypertensive chronic kidney disease with stage 1 through stage 4 chronic kidney disease, or unspecified chronic kidney disease: Secondary | ICD-10-CM | POA: Diagnosis not present

## 2023-11-03 DIAGNOSIS — R251 Tremor, unspecified: Secondary | ICD-10-CM | POA: Diagnosis not present

## 2023-11-03 DIAGNOSIS — Z8673 Personal history of transient ischemic attack (TIA), and cerebral infarction without residual deficits: Secondary | ICD-10-CM | POA: Diagnosis not present

## 2023-11-03 DIAGNOSIS — G8929 Other chronic pain: Secondary | ICD-10-CM | POA: Diagnosis not present

## 2023-11-03 DIAGNOSIS — K589 Irritable bowel syndrome without diarrhea: Secondary | ICD-10-CM | POA: Diagnosis not present

## 2023-11-03 DIAGNOSIS — E1142 Type 2 diabetes mellitus with diabetic polyneuropathy: Secondary | ICD-10-CM | POA: Diagnosis not present

## 2023-11-03 DIAGNOSIS — M1611 Unilateral primary osteoarthritis, right hip: Secondary | ICD-10-CM | POA: Diagnosis not present

## 2023-11-03 DIAGNOSIS — Z96642 Presence of left artificial hip joint: Secondary | ICD-10-CM | POA: Diagnosis not present

## 2023-11-03 DIAGNOSIS — M51369 Other intervertebral disc degeneration, lumbar region without mention of lumbar back pain or lower extremity pain: Secondary | ICD-10-CM | POA: Diagnosis not present

## 2023-11-03 DIAGNOSIS — M48061 Spinal stenosis, lumbar region without neurogenic claudication: Secondary | ICD-10-CM | POA: Diagnosis not present

## 2023-11-03 DIAGNOSIS — Z981 Arthrodesis status: Secondary | ICD-10-CM | POA: Diagnosis not present

## 2023-11-03 MED ORDER — TRAMADOL HCL 50 MG PO TABS: 50.0000 mg | ORAL_TABLET | Freq: Three times a day (TID) | ORAL | 0 refills | Status: DC | PRN

## 2023-11-05 DIAGNOSIS — Z950 Presence of cardiac pacemaker: Secondary | ICD-10-CM | POA: Diagnosis not present

## 2023-11-05 DIAGNOSIS — I499 Cardiac arrhythmia, unspecified: Secondary | ICD-10-CM | POA: Diagnosis not present

## 2023-11-05 DIAGNOSIS — I452 Bifascicular block: Secondary | ICD-10-CM | POA: Diagnosis not present

## 2023-11-05 DIAGNOSIS — I480 Paroxysmal atrial fibrillation: Secondary | ICD-10-CM | POA: Diagnosis not present

## 2023-11-05 DIAGNOSIS — I10 Essential (primary) hypertension: Secondary | ICD-10-CM | POA: Diagnosis not present

## 2023-11-06 DIAGNOSIS — K589 Irritable bowel syndrome without diarrhea: Secondary | ICD-10-CM | POA: Diagnosis not present

## 2023-11-06 DIAGNOSIS — E1142 Type 2 diabetes mellitus with diabetic polyneuropathy: Secondary | ICD-10-CM | POA: Diagnosis not present

## 2023-11-06 DIAGNOSIS — M1611 Unilateral primary osteoarthritis, right hip: Secondary | ICD-10-CM | POA: Diagnosis not present

## 2023-11-06 DIAGNOSIS — N189 Chronic kidney disease, unspecified: Secondary | ICD-10-CM | POA: Diagnosis not present

## 2023-11-06 DIAGNOSIS — R251 Tremor, unspecified: Secondary | ICD-10-CM | POA: Diagnosis not present

## 2023-11-06 DIAGNOSIS — Z8673 Personal history of transient ischemic attack (TIA), and cerebral infarction without residual deficits: Secondary | ICD-10-CM | POA: Diagnosis not present

## 2023-11-06 DIAGNOSIS — M48061 Spinal stenosis, lumbar region without neurogenic claudication: Secondary | ICD-10-CM | POA: Diagnosis not present

## 2023-11-06 DIAGNOSIS — Z96642 Presence of left artificial hip joint: Secondary | ICD-10-CM | POA: Diagnosis not present

## 2023-11-06 DIAGNOSIS — G8929 Other chronic pain: Secondary | ICD-10-CM | POA: Diagnosis not present

## 2023-11-06 DIAGNOSIS — I129 Hypertensive chronic kidney disease with stage 1 through stage 4 chronic kidney disease, or unspecified chronic kidney disease: Secondary | ICD-10-CM | POA: Diagnosis not present

## 2023-11-06 DIAGNOSIS — M51369 Other intervertebral disc degeneration, lumbar region without mention of lumbar back pain or lower extremity pain: Secondary | ICD-10-CM | POA: Diagnosis not present

## 2023-11-06 DIAGNOSIS — Z981 Arthrodesis status: Secondary | ICD-10-CM | POA: Diagnosis not present

## 2023-11-06 DIAGNOSIS — E1122 Type 2 diabetes mellitus with diabetic chronic kidney disease: Secondary | ICD-10-CM | POA: Diagnosis not present

## 2023-11-10 ENCOUNTER — Encounter: Payer: Self-pay | Admitting: Internal Medicine

## 2023-11-10 DIAGNOSIS — E1142 Type 2 diabetes mellitus with diabetic polyneuropathy: Secondary | ICD-10-CM | POA: Diagnosis not present

## 2023-11-10 DIAGNOSIS — I129 Hypertensive chronic kidney disease with stage 1 through stage 4 chronic kidney disease, or unspecified chronic kidney disease: Secondary | ICD-10-CM | POA: Diagnosis not present

## 2023-11-10 DIAGNOSIS — K589 Irritable bowel syndrome without diarrhea: Secondary | ICD-10-CM | POA: Diagnosis not present

## 2023-11-10 DIAGNOSIS — M48061 Spinal stenosis, lumbar region without neurogenic claudication: Secondary | ICD-10-CM | POA: Diagnosis not present

## 2023-11-10 DIAGNOSIS — Z96642 Presence of left artificial hip joint: Secondary | ICD-10-CM | POA: Diagnosis not present

## 2023-11-10 DIAGNOSIS — R251 Tremor, unspecified: Secondary | ICD-10-CM | POA: Diagnosis not present

## 2023-11-10 DIAGNOSIS — N189 Chronic kidney disease, unspecified: Secondary | ICD-10-CM | POA: Diagnosis not present

## 2023-11-10 DIAGNOSIS — Z8673 Personal history of transient ischemic attack (TIA), and cerebral infarction without residual deficits: Secondary | ICD-10-CM | POA: Diagnosis not present

## 2023-11-10 DIAGNOSIS — G8929 Other chronic pain: Secondary | ICD-10-CM | POA: Diagnosis not present

## 2023-11-10 DIAGNOSIS — M51369 Other intervertebral disc degeneration, lumbar region without mention of lumbar back pain or lower extremity pain: Secondary | ICD-10-CM | POA: Diagnosis not present

## 2023-11-10 DIAGNOSIS — E1122 Type 2 diabetes mellitus with diabetic chronic kidney disease: Secondary | ICD-10-CM | POA: Diagnosis not present

## 2023-11-10 DIAGNOSIS — M1611 Unilateral primary osteoarthritis, right hip: Secondary | ICD-10-CM | POA: Diagnosis not present

## 2023-11-10 DIAGNOSIS — Z981 Arthrodesis status: Secondary | ICD-10-CM | POA: Diagnosis not present

## 2023-11-11 NOTE — Telephone Encounter (Signed)
 Called and spoke to pt. Advised her that usually we give her a prescription to take to a medical supply store. If her insurance says there is a form, then it is their specific form. They would need to fax that to [place]. I gave her our fax number for them to fax [place] the form, if there is one.

## 2023-11-18 DIAGNOSIS — G8929 Other chronic pain: Secondary | ICD-10-CM | POA: Diagnosis not present

## 2023-11-18 DIAGNOSIS — K589 Irritable bowel syndrome without diarrhea: Secondary | ICD-10-CM | POA: Diagnosis not present

## 2023-11-18 DIAGNOSIS — Z8673 Personal history of transient ischemic attack (TIA), and cerebral infarction without residual deficits: Secondary | ICD-10-CM | POA: Diagnosis not present

## 2023-11-18 DIAGNOSIS — M1611 Unilateral primary osteoarthritis, right hip: Secondary | ICD-10-CM | POA: Diagnosis not present

## 2023-11-18 DIAGNOSIS — I129 Hypertensive chronic kidney disease with stage 1 through stage 4 chronic kidney disease, or unspecified chronic kidney disease: Secondary | ICD-10-CM | POA: Diagnosis not present

## 2023-11-18 DIAGNOSIS — M48061 Spinal stenosis, lumbar region without neurogenic claudication: Secondary | ICD-10-CM | POA: Diagnosis not present

## 2023-11-18 DIAGNOSIS — Z981 Arthrodesis status: Secondary | ICD-10-CM | POA: Diagnosis not present

## 2023-11-18 DIAGNOSIS — E1122 Type 2 diabetes mellitus with diabetic chronic kidney disease: Secondary | ICD-10-CM | POA: Diagnosis not present

## 2023-11-18 DIAGNOSIS — E1142 Type 2 diabetes mellitus with diabetic polyneuropathy: Secondary | ICD-10-CM | POA: Diagnosis not present

## 2023-11-18 DIAGNOSIS — R251 Tremor, unspecified: Secondary | ICD-10-CM | POA: Diagnosis not present

## 2023-11-18 DIAGNOSIS — Z96642 Presence of left artificial hip joint: Secondary | ICD-10-CM | POA: Diagnosis not present

## 2023-11-18 DIAGNOSIS — N189 Chronic kidney disease, unspecified: Secondary | ICD-10-CM | POA: Diagnosis not present

## 2023-11-18 DIAGNOSIS — M51369 Other intervertebral disc degeneration, lumbar region without mention of lumbar back pain or lower extremity pain: Secondary | ICD-10-CM | POA: Diagnosis not present

## 2023-11-20 DIAGNOSIS — Z981 Arthrodesis status: Secondary | ICD-10-CM | POA: Diagnosis not present

## 2023-11-20 DIAGNOSIS — G8929 Other chronic pain: Secondary | ICD-10-CM | POA: Diagnosis not present

## 2023-11-20 DIAGNOSIS — Z96642 Presence of left artificial hip joint: Secondary | ICD-10-CM | POA: Diagnosis not present

## 2023-11-20 DIAGNOSIS — E1122 Type 2 diabetes mellitus with diabetic chronic kidney disease: Secondary | ICD-10-CM | POA: Diagnosis not present

## 2023-11-20 DIAGNOSIS — Z8673 Personal history of transient ischemic attack (TIA), and cerebral infarction without residual deficits: Secondary | ICD-10-CM | POA: Diagnosis not present

## 2023-11-20 DIAGNOSIS — M51369 Other intervertebral disc degeneration, lumbar region without mention of lumbar back pain or lower extremity pain: Secondary | ICD-10-CM | POA: Diagnosis not present

## 2023-11-20 DIAGNOSIS — M48061 Spinal stenosis, lumbar region without neurogenic claudication: Secondary | ICD-10-CM | POA: Diagnosis not present

## 2023-11-20 DIAGNOSIS — N189 Chronic kidney disease, unspecified: Secondary | ICD-10-CM | POA: Diagnosis not present

## 2023-11-20 DIAGNOSIS — R251 Tremor, unspecified: Secondary | ICD-10-CM | POA: Diagnosis not present

## 2023-11-20 DIAGNOSIS — K589 Irritable bowel syndrome without diarrhea: Secondary | ICD-10-CM | POA: Diagnosis not present

## 2023-11-20 DIAGNOSIS — I129 Hypertensive chronic kidney disease with stage 1 through stage 4 chronic kidney disease, or unspecified chronic kidney disease: Secondary | ICD-10-CM | POA: Diagnosis not present

## 2023-11-20 DIAGNOSIS — M1611 Unilateral primary osteoarthritis, right hip: Secondary | ICD-10-CM | POA: Diagnosis not present

## 2023-11-20 DIAGNOSIS — E1142 Type 2 diabetes mellitus with diabetic polyneuropathy: Secondary | ICD-10-CM | POA: Diagnosis not present

## 2023-12-01 DIAGNOSIS — Z96642 Presence of left artificial hip joint: Secondary | ICD-10-CM | POA: Diagnosis not present

## 2023-12-01 DIAGNOSIS — G8929 Other chronic pain: Secondary | ICD-10-CM | POA: Diagnosis not present

## 2023-12-01 DIAGNOSIS — E1142 Type 2 diabetes mellitus with diabetic polyneuropathy: Secondary | ICD-10-CM | POA: Diagnosis not present

## 2023-12-01 DIAGNOSIS — M48061 Spinal stenosis, lumbar region without neurogenic claudication: Secondary | ICD-10-CM | POA: Diagnosis not present

## 2023-12-01 DIAGNOSIS — M1611 Unilateral primary osteoarthritis, right hip: Secondary | ICD-10-CM | POA: Diagnosis not present

## 2023-12-01 DIAGNOSIS — E1122 Type 2 diabetes mellitus with diabetic chronic kidney disease: Secondary | ICD-10-CM | POA: Diagnosis not present

## 2023-12-01 DIAGNOSIS — Z8673 Personal history of transient ischemic attack (TIA), and cerebral infarction without residual deficits: Secondary | ICD-10-CM | POA: Diagnosis not present

## 2023-12-01 DIAGNOSIS — R251 Tremor, unspecified: Secondary | ICD-10-CM | POA: Diagnosis not present

## 2023-12-01 DIAGNOSIS — I129 Hypertensive chronic kidney disease with stage 1 through stage 4 chronic kidney disease, or unspecified chronic kidney disease: Secondary | ICD-10-CM | POA: Diagnosis not present

## 2023-12-01 DIAGNOSIS — Z981 Arthrodesis status: Secondary | ICD-10-CM | POA: Diagnosis not present

## 2023-12-01 DIAGNOSIS — N189 Chronic kidney disease, unspecified: Secondary | ICD-10-CM | POA: Diagnosis not present

## 2023-12-01 DIAGNOSIS — K589 Irritable bowel syndrome without diarrhea: Secondary | ICD-10-CM | POA: Diagnosis not present

## 2023-12-03 DIAGNOSIS — G8929 Other chronic pain: Secondary | ICD-10-CM | POA: Diagnosis not present

## 2023-12-03 DIAGNOSIS — Z981 Arthrodesis status: Secondary | ICD-10-CM | POA: Diagnosis not present

## 2023-12-03 DIAGNOSIS — E1142 Type 2 diabetes mellitus with diabetic polyneuropathy: Secondary | ICD-10-CM | POA: Diagnosis not present

## 2023-12-03 DIAGNOSIS — M1611 Unilateral primary osteoarthritis, right hip: Secondary | ICD-10-CM | POA: Diagnosis not present

## 2023-12-03 DIAGNOSIS — R251 Tremor, unspecified: Secondary | ICD-10-CM | POA: Diagnosis not present

## 2023-12-03 DIAGNOSIS — E1122 Type 2 diabetes mellitus with diabetic chronic kidney disease: Secondary | ICD-10-CM | POA: Diagnosis not present

## 2023-12-03 DIAGNOSIS — N189 Chronic kidney disease, unspecified: Secondary | ICD-10-CM | POA: Diagnosis not present

## 2023-12-03 DIAGNOSIS — M48061 Spinal stenosis, lumbar region without neurogenic claudication: Secondary | ICD-10-CM | POA: Diagnosis not present

## 2023-12-03 DIAGNOSIS — K589 Irritable bowel syndrome without diarrhea: Secondary | ICD-10-CM | POA: Diagnosis not present

## 2023-12-03 DIAGNOSIS — Z96642 Presence of left artificial hip joint: Secondary | ICD-10-CM | POA: Diagnosis not present

## 2023-12-03 DIAGNOSIS — Z8673 Personal history of transient ischemic attack (TIA), and cerebral infarction without residual deficits: Secondary | ICD-10-CM | POA: Diagnosis not present

## 2023-12-03 DIAGNOSIS — I129 Hypertensive chronic kidney disease with stage 1 through stage 4 chronic kidney disease, or unspecified chronic kidney disease: Secondary | ICD-10-CM | POA: Diagnosis not present

## 2023-12-07 DIAGNOSIS — Z981 Arthrodesis status: Secondary | ICD-10-CM | POA: Diagnosis not present

## 2023-12-07 DIAGNOSIS — M1611 Unilateral primary osteoarthritis, right hip: Secondary | ICD-10-CM | POA: Diagnosis not present

## 2023-12-07 DIAGNOSIS — I129 Hypertensive chronic kidney disease with stage 1 through stage 4 chronic kidney disease, or unspecified chronic kidney disease: Secondary | ICD-10-CM | POA: Diagnosis not present

## 2023-12-07 DIAGNOSIS — G8929 Other chronic pain: Secondary | ICD-10-CM | POA: Diagnosis not present

## 2023-12-07 DIAGNOSIS — E1122 Type 2 diabetes mellitus with diabetic chronic kidney disease: Secondary | ICD-10-CM | POA: Diagnosis not present

## 2023-12-07 DIAGNOSIS — R251 Tremor, unspecified: Secondary | ICD-10-CM | POA: Diagnosis not present

## 2023-12-07 DIAGNOSIS — Z8673 Personal history of transient ischemic attack (TIA), and cerebral infarction without residual deficits: Secondary | ICD-10-CM | POA: Diagnosis not present

## 2023-12-07 DIAGNOSIS — Z96642 Presence of left artificial hip joint: Secondary | ICD-10-CM | POA: Diagnosis not present

## 2023-12-07 DIAGNOSIS — N189 Chronic kidney disease, unspecified: Secondary | ICD-10-CM | POA: Diagnosis not present

## 2023-12-07 DIAGNOSIS — E1142 Type 2 diabetes mellitus with diabetic polyneuropathy: Secondary | ICD-10-CM | POA: Diagnosis not present

## 2023-12-07 DIAGNOSIS — K589 Irritable bowel syndrome without diarrhea: Secondary | ICD-10-CM | POA: Diagnosis not present

## 2023-12-07 DIAGNOSIS — M48061 Spinal stenosis, lumbar region without neurogenic claudication: Secondary | ICD-10-CM | POA: Diagnosis not present

## 2023-12-08 DIAGNOSIS — I10 Essential (primary) hypertension: Secondary | ICD-10-CM | POA: Diagnosis not present

## 2023-12-08 DIAGNOSIS — I872 Venous insufficiency (chronic) (peripheral): Secondary | ICD-10-CM | POA: Diagnosis not present

## 2023-12-08 DIAGNOSIS — H5053 Vertical heterophoria: Secondary | ICD-10-CM | POA: Diagnosis not present

## 2023-12-08 DIAGNOSIS — I452 Bifascicular block: Secondary | ICD-10-CM | POA: Diagnosis not present

## 2023-12-08 DIAGNOSIS — H2513 Age-related nuclear cataract, bilateral: Secondary | ICD-10-CM | POA: Diagnosis not present

## 2023-12-08 DIAGNOSIS — I5032 Chronic diastolic (congestive) heart failure: Secondary | ICD-10-CM | POA: Diagnosis not present

## 2023-12-08 DIAGNOSIS — Z950 Presence of cardiac pacemaker: Secondary | ICD-10-CM | POA: Diagnosis not present

## 2023-12-08 DIAGNOSIS — H524 Presbyopia: Secondary | ICD-10-CM | POA: Diagnosis not present

## 2023-12-08 DIAGNOSIS — I480 Paroxysmal atrial fibrillation: Secondary | ICD-10-CM | POA: Diagnosis not present

## 2023-12-09 ENCOUNTER — Encounter: Payer: Self-pay | Admitting: Internal Medicine

## 2023-12-09 MED ORDER — TRAMADOL HCL 50 MG PO TABS
50.0000 mg | ORAL_TABLET | Freq: Three times a day (TID) | ORAL | 0 refills | Status: DC | PRN
Start: 2023-12-09 — End: 2024-01-11

## 2023-12-09 NOTE — Telephone Encounter (Signed)
 Last written 11-03-23 #180 Last OV 07-03-23 Next OV 01-01-24 [facility]

## 2023-12-10 DIAGNOSIS — Z8673 Personal history of transient ischemic attack (TIA), and cerebral infarction without residual deficits: Secondary | ICD-10-CM | POA: Diagnosis not present

## 2023-12-10 DIAGNOSIS — M1611 Unilateral primary osteoarthritis, right hip: Secondary | ICD-10-CM | POA: Diagnosis not present

## 2023-12-10 DIAGNOSIS — M48061 Spinal stenosis, lumbar region without neurogenic claudication: Secondary | ICD-10-CM | POA: Diagnosis not present

## 2023-12-10 DIAGNOSIS — E1142 Type 2 diabetes mellitus with diabetic polyneuropathy: Secondary | ICD-10-CM | POA: Diagnosis not present

## 2023-12-10 DIAGNOSIS — Z981 Arthrodesis status: Secondary | ICD-10-CM | POA: Diagnosis not present

## 2023-12-10 DIAGNOSIS — R251 Tremor, unspecified: Secondary | ICD-10-CM | POA: Diagnosis not present

## 2023-12-10 DIAGNOSIS — N189 Chronic kidney disease, unspecified: Secondary | ICD-10-CM | POA: Diagnosis not present

## 2023-12-10 DIAGNOSIS — I129 Hypertensive chronic kidney disease with stage 1 through stage 4 chronic kidney disease, or unspecified chronic kidney disease: Secondary | ICD-10-CM | POA: Diagnosis not present

## 2023-12-10 DIAGNOSIS — K589 Irritable bowel syndrome without diarrhea: Secondary | ICD-10-CM | POA: Diagnosis not present

## 2023-12-10 DIAGNOSIS — G8929 Other chronic pain: Secondary | ICD-10-CM | POA: Diagnosis not present

## 2023-12-10 DIAGNOSIS — E1122 Type 2 diabetes mellitus with diabetic chronic kidney disease: Secondary | ICD-10-CM | POA: Diagnosis not present

## 2023-12-10 DIAGNOSIS — Z96642 Presence of left artificial hip joint: Secondary | ICD-10-CM | POA: Diagnosis not present

## 2023-12-15 ENCOUNTER — Other Ambulatory Visit: Payer: Self-pay | Admitting: Internal Medicine

## 2023-12-15 DIAGNOSIS — I129 Hypertensive chronic kidney disease with stage 1 through stage 4 chronic kidney disease, or unspecified chronic kidney disease: Secondary | ICD-10-CM | POA: Diagnosis not present

## 2023-12-15 DIAGNOSIS — M48061 Spinal stenosis, lumbar region without neurogenic claudication: Secondary | ICD-10-CM | POA: Diagnosis not present

## 2023-12-15 DIAGNOSIS — E1142 Type 2 diabetes mellitus with diabetic polyneuropathy: Secondary | ICD-10-CM | POA: Diagnosis not present

## 2023-12-15 DIAGNOSIS — M1611 Unilateral primary osteoarthritis, right hip: Secondary | ICD-10-CM | POA: Diagnosis not present

## 2023-12-15 DIAGNOSIS — R251 Tremor, unspecified: Secondary | ICD-10-CM | POA: Diagnosis not present

## 2023-12-15 DIAGNOSIS — G8929 Other chronic pain: Secondary | ICD-10-CM | POA: Diagnosis not present

## 2023-12-15 DIAGNOSIS — Z981 Arthrodesis status: Secondary | ICD-10-CM | POA: Diagnosis not present

## 2023-12-15 DIAGNOSIS — K589 Irritable bowel syndrome without diarrhea: Secondary | ICD-10-CM | POA: Diagnosis not present

## 2023-12-15 DIAGNOSIS — Z8673 Personal history of transient ischemic attack (TIA), and cerebral infarction without residual deficits: Secondary | ICD-10-CM | POA: Diagnosis not present

## 2023-12-15 DIAGNOSIS — E1122 Type 2 diabetes mellitus with diabetic chronic kidney disease: Secondary | ICD-10-CM | POA: Diagnosis not present

## 2023-12-15 DIAGNOSIS — Z96642 Presence of left artificial hip joint: Secondary | ICD-10-CM | POA: Diagnosis not present

## 2023-12-15 DIAGNOSIS — N189 Chronic kidney disease, unspecified: Secondary | ICD-10-CM | POA: Diagnosis not present

## 2023-12-22 DIAGNOSIS — N189 Chronic kidney disease, unspecified: Secondary | ICD-10-CM | POA: Diagnosis not present

## 2023-12-22 DIAGNOSIS — M48061 Spinal stenosis, lumbar region without neurogenic claudication: Secondary | ICD-10-CM | POA: Diagnosis not present

## 2023-12-22 DIAGNOSIS — Z8673 Personal history of transient ischemic attack (TIA), and cerebral infarction without residual deficits: Secondary | ICD-10-CM | POA: Diagnosis not present

## 2023-12-22 DIAGNOSIS — Z96642 Presence of left artificial hip joint: Secondary | ICD-10-CM | POA: Diagnosis not present

## 2023-12-22 DIAGNOSIS — I129 Hypertensive chronic kidney disease with stage 1 through stage 4 chronic kidney disease, or unspecified chronic kidney disease: Secondary | ICD-10-CM | POA: Diagnosis not present

## 2023-12-22 DIAGNOSIS — G8929 Other chronic pain: Secondary | ICD-10-CM | POA: Diagnosis not present

## 2023-12-22 DIAGNOSIS — R251 Tremor, unspecified: Secondary | ICD-10-CM | POA: Diagnosis not present

## 2023-12-22 DIAGNOSIS — K589 Irritable bowel syndrome without diarrhea: Secondary | ICD-10-CM | POA: Diagnosis not present

## 2023-12-22 DIAGNOSIS — M1611 Unilateral primary osteoarthritis, right hip: Secondary | ICD-10-CM | POA: Diagnosis not present

## 2023-12-22 DIAGNOSIS — Z981 Arthrodesis status: Secondary | ICD-10-CM | POA: Diagnosis not present

## 2023-12-22 DIAGNOSIS — E1142 Type 2 diabetes mellitus with diabetic polyneuropathy: Secondary | ICD-10-CM | POA: Diagnosis not present

## 2023-12-22 DIAGNOSIS — E1122 Type 2 diabetes mellitus with diabetic chronic kidney disease: Secondary | ICD-10-CM | POA: Diagnosis not present

## 2023-12-29 DIAGNOSIS — N189 Chronic kidney disease, unspecified: Secondary | ICD-10-CM | POA: Diagnosis not present

## 2023-12-29 DIAGNOSIS — Z981 Arthrodesis status: Secondary | ICD-10-CM | POA: Diagnosis not present

## 2023-12-29 DIAGNOSIS — K589 Irritable bowel syndrome without diarrhea: Secondary | ICD-10-CM | POA: Diagnosis not present

## 2023-12-29 DIAGNOSIS — M48061 Spinal stenosis, lumbar region without neurogenic claudication: Secondary | ICD-10-CM | POA: Diagnosis not present

## 2023-12-29 DIAGNOSIS — Z96642 Presence of left artificial hip joint: Secondary | ICD-10-CM | POA: Diagnosis not present

## 2023-12-29 DIAGNOSIS — R251 Tremor, unspecified: Secondary | ICD-10-CM | POA: Diagnosis not present

## 2023-12-29 DIAGNOSIS — E1142 Type 2 diabetes mellitus with diabetic polyneuropathy: Secondary | ICD-10-CM | POA: Diagnosis not present

## 2023-12-29 DIAGNOSIS — Z8673 Personal history of transient ischemic attack (TIA), and cerebral infarction without residual deficits: Secondary | ICD-10-CM | POA: Diagnosis not present

## 2023-12-29 DIAGNOSIS — E1122 Type 2 diabetes mellitus with diabetic chronic kidney disease: Secondary | ICD-10-CM | POA: Diagnosis not present

## 2023-12-29 DIAGNOSIS — M1611 Unilateral primary osteoarthritis, right hip: Secondary | ICD-10-CM | POA: Diagnosis not present

## 2023-12-29 DIAGNOSIS — G8929 Other chronic pain: Secondary | ICD-10-CM | POA: Diagnosis not present

## 2023-12-29 DIAGNOSIS — I129 Hypertensive chronic kidney disease with stage 1 through stage 4 chronic kidney disease, or unspecified chronic kidney disease: Secondary | ICD-10-CM | POA: Diagnosis not present

## 2024-01-01 ENCOUNTER — Ambulatory Visit: Payer: 59 | Admitting: Internal Medicine

## 2024-01-01 ENCOUNTER — Encounter: Payer: Self-pay | Admitting: Internal Medicine

## 2024-01-01 VITALS — BP 130/84 | HR 85 | Temp 97.8°F | Ht 60.0 in | Wt 212.0 lb

## 2024-01-01 DIAGNOSIS — E1121 Type 2 diabetes mellitus with diabetic nephropathy: Secondary | ICD-10-CM

## 2024-01-01 DIAGNOSIS — I480 Paroxysmal atrial fibrillation: Secondary | ICD-10-CM

## 2024-01-01 DIAGNOSIS — M5489 Other dorsalgia: Secondary | ICD-10-CM | POA: Diagnosis not present

## 2024-01-01 DIAGNOSIS — I471 Supraventricular tachycardia, unspecified: Secondary | ICD-10-CM | POA: Diagnosis not present

## 2024-01-01 DIAGNOSIS — G8929 Other chronic pain: Secondary | ICD-10-CM | POA: Diagnosis not present

## 2024-01-01 DIAGNOSIS — N1831 Chronic kidney disease, stage 3a: Secondary | ICD-10-CM | POA: Diagnosis not present

## 2024-01-01 DIAGNOSIS — F1120 Opioid dependence, uncomplicated: Secondary | ICD-10-CM

## 2024-01-01 LAB — POCT GLYCOSYLATED HEMOGLOBIN (HGB A1C): Hemoglobin A1C: 6.6 % — AB (ref 4.0–5.6)

## 2024-01-01 NOTE — Assessment & Plan Note (Signed)
 Lab Results  Component Value Date   HGBA1C 6.6 (A) 01/01/2024   Still with good control on without meds

## 2024-01-01 NOTE — Patient Instructions (Signed)
 Please stop the aripiprazole . Let me know if you have any problems with depression or sleep issues. Hopefully this will reduce your appetite and make it easier to eat healthy

## 2024-01-01 NOTE — Assessment & Plan Note (Signed)
 Very low burden On metoprolol 25mg  daily now ASA only per cardiology

## 2024-01-01 NOTE — Assessment & Plan Note (Signed)
 Has gained a lot of weight in past 6 months Sleeping well and mood pretty good Discussed prudent diet Will try off the abilify  to see if that decreases appetite

## 2024-01-01 NOTE — Assessment & Plan Note (Signed)
 PDMP reviewed No concerns

## 2024-01-01 NOTE — Progress Notes (Addendum)
 Subjective:    Patient ID: [name], female    DOB: 1945/12/05, 78 y.o.   MRN: [id number]  HPI Here for follow up of diabetes and other chronic health conditions  Has been seeing the cardiologist Low burden atrial fib and SVT Now on metoprolol No palpitations No chest pain or SOB  Has gained 16# since 6 months ago BMI now 80 Son fixes her meals---so eating better Checks sugars twice a week--usually ~130 No foot/hand numbness or burning  GFR running in high 40's to 50's for some years  Did have 3 day holter monitor Did show occasional runs of SVT--but longest only 21 beats Less than 1% atrial fib--longest 20 seconds  Chronic pain is about the same Stays functional with 6 tramadol  daily Son helps with bathing/dressing Independent with bathroom Son handles the instrumental ADLs  Not able to walk well Has leg and arm weakness Not stable or strong enough to use a cane--so only safe using a walker  Current Outpatient Medications on File Prior to Visit  Medication Sig Dispense Refill   Accu-Chek Softclix Lancets lancets Use as instructed 100 each 3   amLODipine  (NORVASC ) 5 MG tablet Take 5 mg by mouth daily.     ARIPiprazole  (ABILIFY ) 2 MG tablet TAKE 1 TABLET BY MOUTH DAILY AT  BEDTIME 100 tablet 3   aspirin  EC 81 MG tablet Take 81 mg by mouth 3 (three) times a week.     atorvastatin  (LIPITOR) 80 MG tablet TAKE 1 TABLET BY MOUTH ONCE  DAILY 100 tablet 3   cholecalciferol  (VITAMIN D3) 25 MCG (1000 UNIT) tablet Take 1,000 Units by mouth daily.     donepezil  (ARICEPT ) 10 MG tablet Take 1 tablet (10 mg total) by mouth every morning. 90 tablet 3   DULoxetine  (CYMBALTA ) 60 MG capsule TAKE 1 CAPSULE BY MOUTH DAILY 90 capsule 3   gabapentin  (NEURONTIN ) 300 MG capsule Take 2 capsules (600 mg total) by mouth 2 (two) times daily. And 900 mg at bedtime 630 capsule 1   lisinopril -hydrochlorothiazide  (ZESTORETIC ) 20-12.5 MG tablet Take 2 tablets by mouth daily. 180 tablet 3    metoprolol succinate (TOPROL-XL) 25 MG 24 hr tablet Take 25 mg by mouth daily.     Metoprolol Succinate 25 MG CS24 Take by mouth.     Multiple Vitamins-Minerals (MULTIVITAMIN WITH MINERALS) tablet Take 1 tablet by mouth daily.      ONETOUCH VERIO test strip USE TO CHECK BLOOD SUGAR ONCE A DAY DX CODE E11.21 100 strip 12   traMADol  (ULTRAM ) 50 MG tablet Take 1-2 tablets (50-100 mg total) by mouth every 8 (eight) hours as needed. 180 tablet 0   No current facility-administered medications on file prior to visit.    No Known Allergies  Past Medical History:  Diagnosis Date   Arthritis    Confusion    Diabetes mellitus without complication (HCC)    type 2   Hepatitis    pt. denies   Hypertension    Lumbar stenosis    Memory loss    Nocturia    Numbness and tingling    legs and feet   Wears glasses     Past Surgical History:  Procedure Laterality Date   BACK SURGERY     CESAREAN SECTION  1978   COLONOSCOPY     HEMORRHOID SURGERY  2015   JOINT REPLACEMENT     01-08-18 Dr. Jannelle Memory   PACEMAKER LEADLESS INSERTION N/A 12/06/2021   Procedure: PACEMAKER LEADLESS INSERTION;  Surgeon: Percival Brace, MD;  Location: [facility];  Service: Cardiovascular;  Laterality: N/A;   TOTAL HIP ARTHROPLASTY Left 01/08/2018   Procedure: LEFT TOTAL HIP ARTHROPLASTY ANTERIOR APPROACH;  Surgeon: Arnie Lao, MD;  Location: [facility];  Service: Orthopedics;  Laterality: Left;   TRANSFORAMINAL LUMBAR INTERBODY FUSION (TLIF) WITH PEDICLE SCREW FIXATION 2 LEVEL N/A 11/07/2015   Procedure: Lumbar three-four Lumbar four-five  transforaminal lumbar interbody fusion with interbody prosthesis posterior lateral arthrodesis and posterior segmental instrumentation;  Surgeon: Garry Kansas, MD;  Location: [facility];  Service: Neurosurgery;  Laterality: N/A;    Family History  Problem Relation Age of Onset   Hypertension Other    Cancer Other        ovarian   Cancer Mother     Heart disease Father     Social History   Socioeconomic History   Marital status: Divorced    Spouse name: Not on file   Number of children: 4   Years of education: Not on file   Highest education level: Associate degree: occupational, Scientist, product/process development, or vocational program  Occupational History   Not on file  Tobacco Use   Smoking status: Never    Passive exposure: Never   Smokeless tobacco: Never  Vaping Use   Vaping status: Never Used  Substance and Sexual Activity   Alcohol use: No    Comment: rarely   Drug use: No   Sexual activity: Not Currently  Other Topics Concern   Not on file  Social History Narrative   Has a living will.   Son is health care POA   Would accept resuscitation.     Would accept life support    No tube feeds if cognitively unaware   Social Drivers of Health   Financial Resource Strain: Low Risk  (12/15/2022)   Overall Financial Resource Strain (CARDIA)    Difficulty of Paying Living Expenses: Not hard at all  Food Insecurity: Food Insecurity Present (12/15/2022)   Hunger Vital Sign    Worried About Running Out of Food in the Last Year: Sometimes true    Ran Out of Food in the Last Year: Never true  Transportation Needs: Unknown (12/15/2022)   PRAPARE - Administrator, Civil Service (Medical): No    Lack of Transportation (Non-Medical): Not on file  Physical Activity: Unknown (12/15/2022)   Exercise Vital Sign    Days of Exercise per Week: 0 days    Minutes of Exercise per Session: Not on file  Stress: No Stress Concern Present (12/15/2022)   [employer] of Occupational Health - Occupational Stress Questionnaire    Feeling of Stress : Only a little  Social Connections: Socially Isolated (12/15/2022)   Social Connection and Isolation Panel [NHANES]    Frequency of Communication with Friends and Family: Three times a week    Frequency of Social Gatherings with Friends and Family: Once a week    Attends Religious Services: Never    Automotive engineer or Organizations: No    Attends Engineer, structural: Not on file    Marital Status: Divorced  Intimate Partner Violence: Not At Risk (11/20/2017)   Humiliation, Afraid, Rape, and Kick questionnaire    Fear of Current or Ex-Partner: No    Emotionally Abused: No    Physically Abused: No    Sexually Abused: No   Review of Systems Sleeps okay Still some urgent bowel movements--rare incontinence Urinary frequency--wears protection  Objective:   Physical Exam Constitutional:      Appearance: Normal appearance.  Cardiovascular:     Rate and Rhythm: Normal rate and regular rhythm.     Heart sounds: No murmur heard.    No gallop.  Pulmonary:     Effort: Pulmonary effort is normal.     Breath sounds: Normal breath sounds. No wheezing or rales.  Abdominal:     Palpations: Abdomen is soft.     Tenderness: There is no abdominal tenderness.  Musculoskeletal:     Cervical back: Neck supple.     Comments: Trace to 1+ ankle edema only Calves are full but without edema  Lymphadenopathy:     Cervical: No cervical adenopathy.  Neurological:     Mental Status: She is alert.            Assessment & Plan:

## 2024-01-01 NOTE — Assessment & Plan Note (Signed)
 Low burden and no symptoms On metoprolol 25mg  daily now

## 2024-01-01 NOTE — Assessment & Plan Note (Signed)
 Stable for some time Is on lisinopril 

## 2024-01-01 NOTE — Assessment & Plan Note (Signed)
 Does okay with tramadol  --6 daily

## 2024-01-06 DIAGNOSIS — K589 Irritable bowel syndrome without diarrhea: Secondary | ICD-10-CM | POA: Diagnosis not present

## 2024-01-06 DIAGNOSIS — Z8673 Personal history of transient ischemic attack (TIA), and cerebral infarction without residual deficits: Secondary | ICD-10-CM | POA: Diagnosis not present

## 2024-01-06 DIAGNOSIS — G8929 Other chronic pain: Secondary | ICD-10-CM | POA: Diagnosis not present

## 2024-01-06 DIAGNOSIS — M48061 Spinal stenosis, lumbar region without neurogenic claudication: Secondary | ICD-10-CM | POA: Diagnosis not present

## 2024-01-06 DIAGNOSIS — R251 Tremor, unspecified: Secondary | ICD-10-CM | POA: Diagnosis not present

## 2024-01-06 DIAGNOSIS — N189 Chronic kidney disease, unspecified: Secondary | ICD-10-CM | POA: Diagnosis not present

## 2024-01-06 DIAGNOSIS — M1611 Unilateral primary osteoarthritis, right hip: Secondary | ICD-10-CM | POA: Diagnosis not present

## 2024-01-06 DIAGNOSIS — Z981 Arthrodesis status: Secondary | ICD-10-CM | POA: Diagnosis not present

## 2024-01-06 DIAGNOSIS — Z96642 Presence of left artificial hip joint: Secondary | ICD-10-CM | POA: Diagnosis not present

## 2024-01-06 DIAGNOSIS — E1142 Type 2 diabetes mellitus with diabetic polyneuropathy: Secondary | ICD-10-CM | POA: Diagnosis not present

## 2024-01-06 DIAGNOSIS — E1122 Type 2 diabetes mellitus with diabetic chronic kidney disease: Secondary | ICD-10-CM | POA: Diagnosis not present

## 2024-01-06 DIAGNOSIS — I129 Hypertensive chronic kidney disease with stage 1 through stage 4 chronic kidney disease, or unspecified chronic kidney disease: Secondary | ICD-10-CM | POA: Diagnosis not present

## 2024-01-09 ENCOUNTER — Encounter: Payer: Self-pay | Admitting: Internal Medicine

## 2024-01-11 MED ORDER — TRAMADOL HCL 50 MG PO TABS
50.0000 mg | ORAL_TABLET | Freq: Three times a day (TID) | ORAL | 0 refills | Status: DC | PRN
Start: 2024-01-11 — End: 2024-02-10

## 2024-01-11 NOTE — Telephone Encounter (Signed)
 Patients son would like to pick up prescription for walker this morning.

## 2024-01-12 DIAGNOSIS — M1611 Unilateral primary osteoarthritis, right hip: Secondary | ICD-10-CM | POA: Diagnosis not present

## 2024-01-12 DIAGNOSIS — Z8673 Personal history of transient ischemic attack (TIA), and cerebral infarction without residual deficits: Secondary | ICD-10-CM | POA: Diagnosis not present

## 2024-01-12 DIAGNOSIS — G8929 Other chronic pain: Secondary | ICD-10-CM | POA: Diagnosis not present

## 2024-01-12 DIAGNOSIS — R251 Tremor, unspecified: Secondary | ICD-10-CM | POA: Diagnosis not present

## 2024-01-12 DIAGNOSIS — Z981 Arthrodesis status: Secondary | ICD-10-CM | POA: Diagnosis not present

## 2024-01-12 DIAGNOSIS — Z96642 Presence of left artificial hip joint: Secondary | ICD-10-CM | POA: Diagnosis not present

## 2024-01-12 DIAGNOSIS — N189 Chronic kidney disease, unspecified: Secondary | ICD-10-CM | POA: Diagnosis not present

## 2024-01-12 DIAGNOSIS — E1142 Type 2 diabetes mellitus with diabetic polyneuropathy: Secondary | ICD-10-CM | POA: Diagnosis not present

## 2024-01-12 DIAGNOSIS — E1122 Type 2 diabetes mellitus with diabetic chronic kidney disease: Secondary | ICD-10-CM | POA: Diagnosis not present

## 2024-01-12 DIAGNOSIS — I129 Hypertensive chronic kidney disease with stage 1 through stage 4 chronic kidney disease, or unspecified chronic kidney disease: Secondary | ICD-10-CM | POA: Diagnosis not present

## 2024-01-12 DIAGNOSIS — M48061 Spinal stenosis, lumbar region without neurogenic claudication: Secondary | ICD-10-CM | POA: Diagnosis not present

## 2024-01-12 DIAGNOSIS — K589 Irritable bowel syndrome without diarrhea: Secondary | ICD-10-CM | POA: Diagnosis not present

## 2024-01-18 DIAGNOSIS — N189 Chronic kidney disease, unspecified: Secondary | ICD-10-CM | POA: Diagnosis not present

## 2024-01-18 DIAGNOSIS — Z8673 Personal history of transient ischemic attack (TIA), and cerebral infarction without residual deficits: Secondary | ICD-10-CM | POA: Diagnosis not present

## 2024-01-18 DIAGNOSIS — G8929 Other chronic pain: Secondary | ICD-10-CM | POA: Diagnosis not present

## 2024-01-18 DIAGNOSIS — E1122 Type 2 diabetes mellitus with diabetic chronic kidney disease: Secondary | ICD-10-CM | POA: Diagnosis not present

## 2024-01-18 DIAGNOSIS — M48061 Spinal stenosis, lumbar region without neurogenic claudication: Secondary | ICD-10-CM | POA: Diagnosis not present

## 2024-01-18 DIAGNOSIS — I129 Hypertensive chronic kidney disease with stage 1 through stage 4 chronic kidney disease, or unspecified chronic kidney disease: Secondary | ICD-10-CM | POA: Diagnosis not present

## 2024-01-18 DIAGNOSIS — R251 Tremor, unspecified: Secondary | ICD-10-CM | POA: Diagnosis not present

## 2024-01-18 DIAGNOSIS — E1142 Type 2 diabetes mellitus with diabetic polyneuropathy: Secondary | ICD-10-CM | POA: Diagnosis not present

## 2024-01-18 DIAGNOSIS — Z96642 Presence of left artificial hip joint: Secondary | ICD-10-CM | POA: Diagnosis not present

## 2024-01-18 DIAGNOSIS — Z981 Arthrodesis status: Secondary | ICD-10-CM | POA: Diagnosis not present

## 2024-01-18 DIAGNOSIS — M1611 Unilateral primary osteoarthritis, right hip: Secondary | ICD-10-CM | POA: Diagnosis not present

## 2024-01-18 DIAGNOSIS — K589 Irritable bowel syndrome without diarrhea: Secondary | ICD-10-CM | POA: Diagnosis not present

## 2024-01-19 ENCOUNTER — Other Ambulatory Visit: Payer: Self-pay | Admitting: Internal Medicine

## 2024-01-26 ENCOUNTER — Telehealth: Payer: Self-pay | Admitting: Internal Medicine

## 2024-01-26 NOTE — Telephone Encounter (Signed)
 Copied from CRM [id number]. Topic: Clinical - Order For Equipment >> Jan 26, 2024  9:56 AM Jenna Moan wrote: Reason for CRM: [name] from [employer] called to update notes  for insurance purpose on equipment order that a cane can not resolve issue that is why walker is needed

## 2024-01-26 NOTE — Telephone Encounter (Signed)
 Faxed OV Note to them at the number from yesterday.

## 2024-01-27 DIAGNOSIS — M1612 Unilateral primary osteoarthritis, left hip: Secondary | ICD-10-CM | POA: Diagnosis not present

## 2024-02-02 ENCOUNTER — Telehealth: Payer: Self-pay

## 2024-02-02 MED ORDER — METOPROLOL SUCCINATE ER 25 MG PO TB24: 25.0000 mg | ORAL_TABLET | Freq: Every day | ORAL | 1 refills | Status: AC

## 2024-02-02 NOTE — Telephone Encounter (Signed)
 Rx sent electronically.

## 2024-02-10 ENCOUNTER — Encounter: Payer: Self-pay | Admitting: Internal Medicine

## 2024-02-10 MED ORDER — TRAMADOL HCL 50 MG PO TABS: 50.0000 mg | ORAL_TABLET | Freq: Three times a day (TID) | ORAL | 0 refills | Status: DC | PRN

## 2024-02-10 NOTE — Telephone Encounter (Signed)
 Last written 01-11-24 #180 Last OV 01-01-24 No Future OV [facility]

## 2024-02-19 ENCOUNTER — Other Ambulatory Visit: Payer: Self-pay | Admitting: Internal Medicine

## 2024-02-23 MED ORDER — ONETOUCH VERIO VI STRP
ORAL_STRIP | 12 refills | Status: DC
Start: 2024-02-23 — End: 2024-03-21

## 2024-02-23 NOTE — Telephone Encounter (Signed)
 Advised pt it was sent, again.

## 2024-02-23 NOTE — Telephone Encounter (Signed)
 They were sent a 2nd time yesterday and went through. But, I sent it, again, now.

## 2024-02-23 NOTE — Telephone Encounter (Unsigned)
 Copied from CRM [id number]. Topic: Clinical - Medication Question >> Feb 23, 2024 12:20 PM Adonis Hoot wrote: Reason for CRM: Patient would like to know if prescription for test scripts will be resent to pharmacy since there was a transmission error?

## 2024-03-09 ENCOUNTER — Encounter: Payer: Self-pay | Admitting: Internal Medicine

## 2024-03-11 ENCOUNTER — Other Ambulatory Visit: Payer: Self-pay | Admitting: Internal Medicine

## 2024-03-14 NOTE — Telephone Encounter (Signed)
 Prescription Request  03/14/2024  LOV: 01/01/2024  What is the name of the medication or equipment? Tramadol  50 mg  Have you contacted your pharmacy to request a refill? Yes   Which pharmacy would you like this sent to?  [facility]  Patient notified that their request is being sent to the clinical staff for review and that they should receive a response within 2 business days.   Please advise at [facility] [phone]   Tramadol  50 mg last refilled #180 on 02/10/24.

## 2024-03-14 NOTE — Telephone Encounter (Signed)
 SABRA

## 2024-03-14 NOTE — Telephone Encounter (Signed)
 See below sent refill request to Dr Jimmy.

## 2024-03-16 ENCOUNTER — Other Ambulatory Visit: Payer: Self-pay | Admitting: Internal Medicine

## 2024-03-18 ENCOUNTER — Telehealth: Payer: Self-pay

## 2024-03-18 NOTE — Telephone Encounter (Signed)
 Letter in the mail from insurance that her OneTouch diabetes supplies will no longer be covered starting August 3rd. The ones they will cover are:    Contour Plus Blue Blood Glucose Meter  Contour NEXT GEN Blood Glucose Meter Contour NEXT ONE Meter Contour NEXT EZ Meter    Contour PLUS BLOOD test strips  Contour Next test strips

## 2024-03-21 MED ORDER — ACCU-CHEK SMARTVIEW VI STRP: ORAL_STRIP | 3 refills | Status: AC

## 2024-03-21 MED ORDER — ACCU-CHEK GUIDE ME W/DEVICE KIT: 1.0000 | PACK | Freq: Once | 0 refills | Status: AC

## 2024-03-21 NOTE — Addendum Note (Signed)
 Addended by: KALLIE CLOTILDA SQUIBB on: 03/21/2024 09:55 AM   Modules accepted: Orders

## 2024-03-21 NOTE — Telephone Encounter (Signed)
New meter and strips sent to pharmacy.

## 2024-03-29 ENCOUNTER — Other Ambulatory Visit: Payer: Self-pay | Admitting: Internal Medicine

## 2024-04-09 ENCOUNTER — Encounter: Payer: Self-pay | Admitting: Internal Medicine

## 2024-04-11 ENCOUNTER — Telehealth: Payer: Self-pay | Admitting: Internal Medicine

## 2024-04-11 MED ORDER — TRAMADOL HCL 50 MG PO TABS: 50.0000 mg | ORAL_TABLET | Freq: Three times a day (TID) | ORAL | 0 refills | Status: DC | PRN

## 2024-04-11 NOTE — Telephone Encounter (Signed)
 Name of Medication:  Tramadol   [facility]  Last Fill or Written Date and Quantity:  03/14/24 #180 0 rf  Last Office Visit and Type: 01/01/24 ov  Next Office Visit and Type: toc dugal 07/04/24 Last Controlled Substance Agreement Date: n/a  Last UDS: n/a

## 2024-04-11 NOTE — Telephone Encounter (Signed)
 Copied from CRM [id number]. Topic: General - Other >> Apr 11, 2024  1:24 PM Jasmin G wrote: Reason for CRM: Pt needs an official letter stating that her son is her official caretaker, please upload through MyChart.

## 2024-04-14 NOTE — Telephone Encounter (Signed)
 Copied from CRM [id number]. Topic: General - Other >> Apr 14, 2024  3:58 PM Aisha D wrote: Reason for CRM: Pt is calling to get letter corrected that states her son is taking care of her. Pt stated that her son's name is incorrect on the letter and it should say Cordella Shaver. Pt would like to have this updated and submitted on MyChart.

## 2024-04-15 NOTE — Telephone Encounter (Signed)
 Spoke to pt. Advised her that she will need to get on MyChart and write out exactly what the letter needs to say so he can write it correctly. I did say there would be a charge, but I am not sure she heard that.

## 2024-04-26 ENCOUNTER — Ambulatory Visit: Payer: Self-pay

## 2024-04-26 NOTE — Telephone Encounter (Addendum)
  First attempt; vmb is full Second attempt; vmb is full Third attempt; vmb is full     Copied from CRM #[id number]. Topic: Clinical - Medication Question >> Apr 26, 2024 12:45 PM Drema MATSU wrote: Reason for CRM: Patient stated that her blood sugar has been running high and thinks she may need to go back on metformin . Blood sugar readings have been 156 and two days before that it was 136.

## 2024-04-27 ENCOUNTER — Other Ambulatory Visit: Payer: Self-pay

## 2024-04-27 MED ORDER — METFORMIN HCL ER 500 MG PO TB24: 500.0000 mg | ORAL_TABLET | Freq: Every day | ORAL | 3 refills | Status: AC

## 2024-04-27 MED ORDER — METFORMIN HCL ER 500 MG PO TB24: 500.0000 mg | ORAL_TABLET | Freq: Every day | ORAL | 3 refills | Status: DC

## 2024-04-27 NOTE — Addendum Note (Signed)
 Addended by: KALLIE CLOTILDA SQUIBB on: 04/27/2024 04:14 PM   Modules accepted: Orders

## 2024-04-27 NOTE — Telephone Encounter (Signed)
 Spoke to pt. Rx sent to [facility] at her request.

## 2024-04-29 ENCOUNTER — Telehealth: Payer: Self-pay | Admitting: Internal Medicine

## 2024-04-29 NOTE — Telephone Encounter (Signed)
 Son dropped off ppwk for provider placed in providers box

## 2024-04-30 ENCOUNTER — Encounter: Payer: Self-pay | Admitting: Internal Medicine

## 2024-05-01 ENCOUNTER — Other Ambulatory Visit: Payer: Self-pay | Admitting: Internal Medicine

## 2024-05-03 DIAGNOSIS — I442 Atrioventricular block, complete: Secondary | ICD-10-CM | POA: Diagnosis not present

## 2024-05-04 ENCOUNTER — Telehealth: Payer: Self-pay | Admitting: Internal Medicine

## 2024-05-04 NOTE — Telephone Encounter (Signed)
 She does want it at mail order. Will forward to Dr Jimmy

## 2024-05-04 NOTE — Telephone Encounter (Signed)
 Copied from CRM [id number]. Topic: General - Other >> May 04, 2024  8:07 AM Martinique E wrote: Reason for CRM: Patient calling in to see if the paperwork that her son dropped off last week is complete. Callback number for patient is [phone] with an update.

## 2024-05-04 NOTE — Telephone Encounter (Signed)
 Spoke to pt. I apologized. There was no note sent to me about it. It was placed on my desk the other day, but it had been printed from MyChart so I thought it was shred. I did a new letter and will have Dr Jimmy sign it and have it up front by 2pm today.

## 2024-05-10 ENCOUNTER — Emergency Department

## 2024-05-10 ENCOUNTER — Observation Stay: Admitting: Certified Registered"

## 2024-05-10 ENCOUNTER — Other Ambulatory Visit: Payer: Self-pay

## 2024-05-10 ENCOUNTER — Observation Stay

## 2024-05-10 ENCOUNTER — Observation Stay
Admission: EM | Admit: 2024-05-10 | Discharge: 2024-05-11 | Disposition: A | Discharge status: Other Acute Inpatient Hospital | Attending: Surgery | Admitting: Surgery

## 2024-05-10 ENCOUNTER — Encounter
Admission: EM | Disposition: A | Discharge status: Other Acute Inpatient Hospital | Payer: Self-pay | Source: Home / Self Care

## 2024-05-10 ENCOUNTER — Inpatient Hospital Stay (HOSPITAL_COMMUNITY): Admit: 2024-05-10 | Admitting: Internal Medicine

## 2024-05-10 ENCOUNTER — Encounter (HOSPITAL_COMMUNITY): Payer: Self-pay

## 2024-05-10 DIAGNOSIS — D72829 Elevated white blood cell count, unspecified: Secondary | ICD-10-CM | POA: Diagnosis not present

## 2024-05-10 DIAGNOSIS — Z01810 Encounter for preprocedural cardiovascular examination: Secondary | ICD-10-CM | POA: Diagnosis not present

## 2024-05-10 DIAGNOSIS — M25519 Pain in unspecified shoulder: Secondary | ICD-10-CM | POA: Diagnosis not present

## 2024-05-10 DIAGNOSIS — F3341 Major depressive disorder, recurrent, in partial remission: Secondary | ICD-10-CM | POA: Diagnosis not present

## 2024-05-10 DIAGNOSIS — M47811 Spondylosis without myelopathy or radiculopathy, occipito-atlanto-axial region: Secondary | ICD-10-CM | POA: Diagnosis not present

## 2024-05-10 DIAGNOSIS — S42294A Other nondisplaced fracture of upper end of right humerus, initial encounter for closed fracture: Secondary | ICD-10-CM | POA: Insufficient documentation

## 2024-05-10 DIAGNOSIS — I130 Hypertensive heart and chronic kidney disease with heart failure and stage 1 through stage 4 chronic kidney disease, or unspecified chronic kidney disease: Secondary | ICD-10-CM | POA: Insufficient documentation

## 2024-05-10 DIAGNOSIS — R0902 Hypoxemia: Secondary | ICD-10-CM | POA: Insufficient documentation

## 2024-05-10 DIAGNOSIS — I480 Paroxysmal atrial fibrillation: Secondary | ICD-10-CM | POA: Diagnosis not present

## 2024-05-10 DIAGNOSIS — S42201A Unspecified fracture of upper end of right humerus, initial encounter for closed fracture: Secondary | ICD-10-CM

## 2024-05-10 DIAGNOSIS — I5032 Chronic diastolic (congestive) heart failure: Secondary | ICD-10-CM | POA: Diagnosis not present

## 2024-05-10 DIAGNOSIS — I10 Essential (primary) hypertension: Secondary | ICD-10-CM | POA: Diagnosis not present

## 2024-05-10 DIAGNOSIS — J45909 Unspecified asthma, uncomplicated: Secondary | ICD-10-CM | POA: Diagnosis not present

## 2024-05-10 DIAGNOSIS — S42291A Other displaced fracture of upper end of right humerus, initial encounter for closed fracture: Secondary | ICD-10-CM | POA: Diagnosis not present

## 2024-05-10 DIAGNOSIS — E236 Other disorders of pituitary gland: Secondary | ICD-10-CM | POA: Diagnosis not present

## 2024-05-10 DIAGNOSIS — I110 Hypertensive heart disease with heart failure: Secondary | ICD-10-CM | POA: Insufficient documentation

## 2024-05-10 DIAGNOSIS — J9811 Atelectasis: Secondary | ICD-10-CM | POA: Diagnosis not present

## 2024-05-10 DIAGNOSIS — R42 Dizziness and giddiness: Secondary | ICD-10-CM | POA: Diagnosis not present

## 2024-05-10 DIAGNOSIS — I6782 Cerebral ischemia: Secondary | ICD-10-CM | POA: Diagnosis not present

## 2024-05-10 DIAGNOSIS — R0989 Other specified symptoms and signs involving the circulatory and respiratory systems: Secondary | ICD-10-CM | POA: Diagnosis not present

## 2024-05-10 DIAGNOSIS — I5030 Unspecified diastolic (congestive) heart failure: Secondary | ICD-10-CM | POA: Insufficient documentation

## 2024-05-10 DIAGNOSIS — G894 Chronic pain syndrome: Secondary | ICD-10-CM | POA: Insufficient documentation

## 2024-05-10 DIAGNOSIS — N189 Chronic kidney disease, unspecified: Secondary | ICD-10-CM | POA: Insufficient documentation

## 2024-05-10 DIAGNOSIS — M129 Arthropathy, unspecified: Secondary | ICD-10-CM | POA: Diagnosis not present

## 2024-05-10 DIAGNOSIS — M5021 Other cervical disc displacement,  high cervical region: Secondary | ICD-10-CM | POA: Diagnosis not present

## 2024-05-10 DIAGNOSIS — Z743 Need for continuous supervision: Secondary | ICD-10-CM | POA: Diagnosis not present

## 2024-05-10 DIAGNOSIS — S53114A Anterior dislocation of right ulnohumeral joint, initial encounter: Secondary | ICD-10-CM | POA: Diagnosis not present

## 2024-05-10 DIAGNOSIS — Z96642 Presence of left artificial hip joint: Secondary | ICD-10-CM | POA: Diagnosis not present

## 2024-05-10 DIAGNOSIS — E785 Hyperlipidemia, unspecified: Secondary | ICD-10-CM | POA: Diagnosis not present

## 2024-05-10 DIAGNOSIS — I442 Atrioventricular block, complete: Secondary | ICD-10-CM | POA: Diagnosis not present

## 2024-05-10 DIAGNOSIS — S43014A Anterior dislocation of right humerus, initial encounter: Principal | ICD-10-CM | POA: Insufficient documentation

## 2024-05-10 DIAGNOSIS — Z7984 Long term (current) use of oral hypoglycemic drugs: Secondary | ICD-10-CM | POA: Insufficient documentation

## 2024-05-10 DIAGNOSIS — S43004A Unspecified dislocation of right shoulder joint, initial encounter: Secondary | ICD-10-CM | POA: Diagnosis not present

## 2024-05-10 DIAGNOSIS — M25511 Pain in right shoulder: Secondary | ICD-10-CM

## 2024-05-10 DIAGNOSIS — W19XXXA Unspecified fall, initial encounter: Secondary | ICD-10-CM | POA: Diagnosis not present

## 2024-05-10 DIAGNOSIS — E119 Type 2 diabetes mellitus without complications: Secondary | ICD-10-CM | POA: Insufficient documentation

## 2024-05-10 DIAGNOSIS — Z79899 Other long term (current) drug therapy: Secondary | ICD-10-CM | POA: Insufficient documentation

## 2024-05-10 DIAGNOSIS — E1140 Type 2 diabetes mellitus with diabetic neuropathy, unspecified: Secondary | ICD-10-CM | POA: Diagnosis present

## 2024-05-10 DIAGNOSIS — E1122 Type 2 diabetes mellitus with diabetic chronic kidney disease: Secondary | ICD-10-CM | POA: Diagnosis not present

## 2024-05-10 DIAGNOSIS — S43006A Unspecified dislocation of unspecified shoulder joint, initial encounter: Secondary | ICD-10-CM | POA: Diagnosis not present

## 2024-05-10 DIAGNOSIS — Z950 Presence of cardiac pacemaker: Secondary | ICD-10-CM | POA: Diagnosis not present

## 2024-05-10 DIAGNOSIS — R404 Transient alteration of awareness: Secondary | ICD-10-CM | POA: Diagnosis not present

## 2024-05-10 HISTORY — PX: SHOULDER CLOSED REDUCTION: SHX1051

## 2024-05-10 LAB — CBC WITH DIFFERENTIAL/PLATELET
Abs Immature Granulocytes: 0.07 K/uL (ref 0.00–0.07)
Basophils Absolute: 0.1 K/uL (ref 0.0–0.1)
Basophils Relative: 0 %
Eosinophils Absolute: 0.1 K/uL (ref 0.0–0.5)
Eosinophils Relative: 1 %
HCT: 49.3 % — ABNORMAL HIGH (ref 36.0–46.0)
Hemoglobin: 15.7 g/dL — ABNORMAL HIGH (ref 12.0–15.0)
Immature Granulocytes: 1 %
Lymphocytes Relative: 13 %
Lymphs Abs: 1.8 K/uL (ref 0.7–4.0)
MCH: 32.2 pg (ref 26.0–34.0)
MCHC: 31.8 g/dL (ref 30.0–36.0)
MCV: 101 fL — ABNORMAL HIGH (ref 80.0–100.0)
Monocytes Absolute: 0.7 K/uL (ref 0.1–1.0)
Monocytes Relative: 5 %
Neutro Abs: 11.5 K/uL — ABNORMAL HIGH (ref 1.7–7.7)
Neutrophils Relative %: 80 %
Platelets: 267 K/uL (ref 150–400)
RBC: 4.88 MIL/uL (ref 3.87–5.11)
RDW: 13.2 % (ref 11.5–15.5)
WBC: 14.2 K/uL — ABNORMAL HIGH (ref 4.0–10.5)
nRBC: 0 % (ref 0.0–0.2)

## 2024-05-10 LAB — CBC
HCT: 45.7 % (ref 36.0–46.0)
Hemoglobin: 14.8 g/dL (ref 12.0–15.0)
MCH: 32.3 pg (ref 26.0–34.0)
MCHC: 32.4 g/dL (ref 30.0–36.0)
MCV: 99.8 fL (ref 80.0–100.0)
Platelets: 271 K/uL (ref 150–400)
RBC: 4.58 MIL/uL (ref 3.87–5.11)
RDW: 13.2 % (ref 11.5–15.5)
WBC: 19.6 K/uL — ABNORMAL HIGH (ref 4.0–10.5)
nRBC: 0 % (ref 0.0–0.2)

## 2024-05-10 LAB — BASIC METABOLIC PANEL WITH GFR
Anion gap: 12 (ref 5–15)
BUN: 31 mg/dL — ABNORMAL HIGH (ref 8–23)
CO2: 26 mmol/L (ref 22–32)
Calcium: 8.9 mg/dL (ref 8.9–10.3)
Chloride: 101 mmol/L (ref 98–111)
Creatinine, Ser: 1.04 mg/dL — ABNORMAL HIGH (ref 0.44–1.00)
GFR, Estimated: 56 mL/min — ABNORMAL LOW (ref >60)
Glucose, Bld: 155 mg/dL — ABNORMAL HIGH (ref 70–99)
Potassium: 4.3 mmol/L (ref 3.5–5.1)
Sodium: 139 mmol/L (ref 135–145)

## 2024-05-10 LAB — CREATININE, SERUM
Creatinine, Ser: 0.86 mg/dL (ref 0.44–1.00)
GFR, Estimated: >60 mL/min (ref >60)

## 2024-05-10 LAB — GLUCOSE, CAPILLARY
Glucose-Capillary: 174 mg/dL — ABNORMAL HIGH (ref 70–99)
Glucose-Capillary: 158 mg/dL — ABNORMAL HIGH (ref 70–99)
Glucose-Capillary: 142 mg/dL — ABNORMAL HIGH (ref 70–99)

## 2024-05-10 SURGERY — CLOSED REDUCTION, SHOULDER
Anesthesia: General | Site: Shoulder | Laterality: Right

## 2024-05-10 MED ORDER — HYDROMORPHONE HCL 1 MG/ML IJ SOLN
INTRAMUSCULAR | Status: AC
Start: 2024-05-10 — End: 2024-05-10
  Filled 2024-05-10: qty 1

## 2024-05-10 MED ORDER — ONDANSETRON HCL 4 MG/2ML IJ SOLN: 4.0000 mg | Freq: Four times a day (QID) | INTRAMUSCULAR | Status: DC | PRN

## 2024-05-10 MED ORDER — LISINOPRIL 20 MG PO TABS
20.0000 mg | ORAL_TABLET | Freq: Every day | ORAL | Status: DC
  Administered 2024-05-10: 20 mg via ORAL
  Filled 2024-05-10: qty 1

## 2024-05-10 MED ORDER — FLEET ENEMA RE ENEM: 1.0000 | ENEMA | Freq: Once | RECTAL | Status: DC | PRN

## 2024-05-10 MED ORDER — METOCLOPRAMIDE HCL 5 MG/ML IJ SOLN: 5.0000 mg | Freq: Three times a day (TID) | INTRAMUSCULAR | Status: DC | PRN

## 2024-05-10 MED ORDER — ATORVASTATIN CALCIUM 80 MG PO TABS
80.0000 mg | ORAL_TABLET | Freq: Every day | ORAL | Status: DC
  Administered 2024-05-10: 80 mg via ORAL
  Filled 2024-05-10: qty 1

## 2024-05-10 MED ORDER — FENTANYL CITRATE PF 50 MCG/ML IJ SOSY: 50.0000 ug | PREFILLED_SYRINGE | Freq: Once | INTRAMUSCULAR | Status: DC

## 2024-05-10 MED ORDER — DROPERIDOL 2.5 MG/ML IJ SOLN: 0.6250 mg | Freq: Once | INTRAMUSCULAR | Status: DC | PRN

## 2024-05-10 MED ORDER — ENOXAPARIN SODIUM 40 MG/0.4ML IJ SOSY: 40.0000 mg | PREFILLED_SYRINGE | INTRAMUSCULAR | Status: DC

## 2024-05-10 MED ORDER — GABAPENTIN 300 MG PO CAPS
600.0000 mg | ORAL_CAPSULE | Freq: Two times a day (BID) | ORAL | Status: DC
  Administered 2024-05-10: 600 mg via ORAL
  Filled 2024-05-10: qty 2

## 2024-05-10 MED ORDER — PHENYLEPHRINE 80 MCG/ML (10ML) SYRINGE FOR IV PUSH (FOR BLOOD PRESSURE SUPPORT)
PREFILLED_SYRINGE | INTRAVENOUS | Status: DC | PRN
  Administered 2024-05-10 (×4): 80 ug via INTRAVENOUS

## 2024-05-10 MED ORDER — PROPOFOL 10 MG/ML IV BOLUS
0.5000 mg/kg | Freq: Once | INTRAVENOUS | Status: AC
  Administered 2024-05-10: 48.8 mg via INTRAVENOUS
  Filled 2024-05-10: qty 20

## 2024-05-10 MED ORDER — BISACODYL 10 MG RE SUPP: 10.0000 mg | Freq: Every day | RECTAL | Status: DC | PRN

## 2024-05-10 MED ORDER — ONDANSETRON HCL 4 MG PO TABS: 4.0000 mg | ORAL_TABLET | Freq: Four times a day (QID) | ORAL | Status: DC | PRN

## 2024-05-10 MED ORDER — ACETAMINOPHEN 10 MG/ML IV SOLN
1000.0000 mg | Freq: Once | INTRAVENOUS | Status: DC | PRN
  Administered 2024-05-10: 1000 mg via INTRAVENOUS

## 2024-05-10 MED ORDER — NYSTATIN 100000 UNIT/GM EX CREA
TOPICAL_CREAM | Freq: Two times a day (BID) | CUTANEOUS | Status: DC
  Filled 2024-05-10: qty 30

## 2024-05-10 MED ORDER — METOPROLOL SUCCINATE ER 25 MG PO TB24
25.0000 mg | ORAL_TABLET | Freq: Every day | ORAL | Status: DC
  Administered 2024-05-10 – 2024-05-11 (×2): 25 mg via ORAL
  Filled 2024-05-10 (×2): qty 1

## 2024-05-10 MED ORDER — DONEPEZIL HCL 5 MG PO TABS
10.0000 mg | ORAL_TABLET | Freq: Every morning | ORAL | Status: DC
Start: 2024-05-10 — End: 2024-05-11
  Administered 2024-05-10: 10 mg via ORAL
  Filled 2024-05-10: qty 2

## 2024-05-10 MED ORDER — ASPIRIN 81 MG PO TBEC: 81.0000 mg | DELAYED_RELEASE_TABLET | ORAL | Status: DC

## 2024-05-10 MED ORDER — HYDROMORPHONE HCL 1 MG/ML IJ SOLN
0.2500 mg | INTRAMUSCULAR | Status: DC | PRN
  Administered 2024-05-10 (×3): 0.5 mg via INTRAVENOUS

## 2024-05-10 MED ORDER — ENOXAPARIN SODIUM 60 MG/0.6ML IJ SOSY
50.0000 mg | PREFILLED_SYRINGE | INTRAMUSCULAR | Status: DC
  Administered 2024-05-10: 50 mg via SUBCUTANEOUS
  Filled 2024-05-10: qty 0.6

## 2024-05-10 MED ORDER — DEXMEDETOMIDINE HCL IN NACL 80 MCG/20ML IV SOLN
INTRAVENOUS | Status: AC
Start: 2024-05-10 — End: 2024-05-10
  Filled 2024-05-10: qty 20

## 2024-05-10 MED ORDER — ACETAMINOPHEN 650 MG RE SUPP
650.0000 mg | Freq: Four times a day (QID) | RECTAL | Status: DC | PRN
Start: 2024-05-10 — End: 2024-05-10

## 2024-05-10 MED ORDER — LACTATED RINGERS IV SOLN: INTRAVENOUS | Status: DC | PRN

## 2024-05-10 MED ORDER — METFORMIN HCL ER 500 MG PO TB24
500.0000 mg | ORAL_TABLET | Freq: Every day | ORAL | Status: DC
  Filled 2024-05-10: qty 1

## 2024-05-10 MED ORDER — LISINOPRIL-HYDROCHLOROTHIAZIDE 20-12.5 MG PO TABS: 2.0000 | ORAL_TABLET | Freq: Every day | ORAL | Status: DC

## 2024-05-10 MED ORDER — FUROSEMIDE 10 MG/ML IJ SOLN
40.0000 mg | Freq: Once | INTRAMUSCULAR | Status: AC
  Administered 2024-05-10: 40 mg via INTRAVENOUS
  Filled 2024-05-10: qty 4

## 2024-05-10 MED ORDER — ACETAMINOPHEN 325 MG PO TABS: 650.0000 mg | ORAL_TABLET | Freq: Four times a day (QID) | ORAL | Status: DC | PRN

## 2024-05-10 MED ORDER — DULOXETINE HCL 30 MG PO CPEP
60.0000 mg | ORAL_CAPSULE | Freq: Every day | ORAL | Status: DC
  Administered 2024-05-10: 60 mg via ORAL
  Filled 2024-05-10: qty 2

## 2024-05-10 MED ORDER — METOCLOPRAMIDE HCL 5 MG PO TABS: 5.0000 mg | ORAL_TABLET | Freq: Three times a day (TID) | ORAL | Status: DC | PRN

## 2024-05-10 MED ORDER — MORPHINE SULFATE (PF) 4 MG/ML IV SOLN
4.0000 mg | Freq: Once | INTRAVENOUS | Status: AC
  Administered 2024-05-10: 4 mg via INTRAVENOUS
  Filled 2024-05-10: qty 1

## 2024-05-10 MED ORDER — DOCUSATE SODIUM 100 MG PO CAPS
100.0000 mg | ORAL_CAPSULE | Freq: Two times a day (BID) | ORAL | Status: DC
  Administered 2024-05-10: 100 mg via ORAL
  Filled 2024-05-10: qty 1

## 2024-05-10 MED ORDER — TRIAMCINOLONE ACETONIDE 40 MG/ML IJ SUSP
INTRAMUSCULAR | Status: AC
  Filled 2024-05-10: qty 1

## 2024-05-10 MED ORDER — ONDANSETRON HCL 4 MG/2ML IJ SOLN
4.0000 mg | Freq: Once | INTRAMUSCULAR | Status: AC
  Administered 2024-05-10: 4 mg via INTRAVENOUS
  Filled 2024-05-10: qty 2

## 2024-05-10 MED ORDER — HYDROCHLOROTHIAZIDE 12.5 MG PO TABS
12.5000 mg | ORAL_TABLET | Freq: Every day | ORAL | Status: DC
  Administered 2024-05-10: 12.5 mg via ORAL
  Filled 2024-05-10: qty 1

## 2024-05-10 MED ORDER — ACETAMINOPHEN 10 MG/ML IV SOLN
INTRAVENOUS | Status: AC
  Filled 2024-05-10: qty 100

## 2024-05-10 MED ORDER — INSULIN ASPART 100 UNIT/ML IJ SOLN: 0.0000 [IU] | Freq: Every day | INTRAMUSCULAR | Status: DC

## 2024-05-10 MED ORDER — POLYETHYLENE GLYCOL 3350 17 G PO PACK
17.0000 g | PACK | Freq: Every day | ORAL | Status: DC | PRN
Start: 2024-05-10 — End: 2024-05-10

## 2024-05-10 MED ORDER — SODIUM CHLORIDE 0.9 % IV SOLN: INTRAVENOUS | Status: DC

## 2024-05-10 MED ORDER — FENTANYL CITRATE (PF) 100 MCG/2ML IJ SOLN
INTRAMUSCULAR | Status: AC
  Filled 2024-05-10: qty 2

## 2024-05-10 MED ORDER — HYDROMORPHONE HCL 1 MG/ML IJ SOLN
INTRAMUSCULAR | Status: AC
  Filled 2024-05-10: qty 1

## 2024-05-10 MED ORDER — ADULT MULTIVITAMIN W/MINERALS CH
1.0000 | ORAL_TABLET | Freq: Every day | ORAL | Status: DC
  Administered 2024-05-10: 1 via ORAL
  Filled 2024-05-10: qty 1

## 2024-05-10 MED ORDER — ACETAMINOPHEN 325 MG PO TABS
325.0000 mg | ORAL_TABLET | Freq: Four times a day (QID) | ORAL | Status: DC | PRN
  Administered 2024-05-11: 325 mg via ORAL
  Filled 2024-05-10: qty 2

## 2024-05-10 MED ORDER — FENTANYL CITRATE (PF) 100 MCG/2ML IJ SOLN
INTRAMUSCULAR | Status: DC | PRN
  Administered 2024-05-10: 50 ug via INTRAVENOUS

## 2024-05-10 MED ORDER — AMLODIPINE BESYLATE 5 MG PO TABS: 5.0000 mg | ORAL_TABLET | Freq: Every day | ORAL | Status: DC

## 2024-05-10 MED ORDER — HYDROMORPHONE HCL 1 MG/ML IJ SOLN
0.5000 mg | Freq: Once | INTRAMUSCULAR | Status: AC
  Administered 2024-05-10: 0.5 mg via INTRAVENOUS
  Filled 2024-05-10: qty 0.5

## 2024-05-10 MED ORDER — PROPOFOL 10 MG/ML IV BOLUS
INTRAVENOUS | Status: DC | PRN
  Administered 2024-05-10 (×5): 20 mg via INTRAVENOUS
  Administered 2024-05-10: 50 mg via INTRAVENOUS

## 2024-05-10 MED ORDER — BUPIVACAINE-EPINEPHRINE (PF) 0.5% -1:200000 IJ SOLN
INTRAMUSCULAR | Status: AC
  Filled 2024-05-10: qty 10

## 2024-05-10 MED ORDER — PROPOFOL 10 MG/ML IV BOLUS
INTRAVENOUS | Status: AC | PRN
  Administered 2024-05-10 (×2): 20 mg via INTRAVENOUS

## 2024-05-10 MED ORDER — DEXMEDETOMIDINE HCL IN NACL 80 MCG/20ML IV SOLN
INTRAVENOUS | Status: DC | PRN
  Administered 2024-05-10: 4 ug via INTRAVENOUS
  Administered 2024-05-10: 8 ug via INTRAVENOUS

## 2024-05-10 MED ORDER — INSULIN ASPART 100 UNIT/ML IJ SOLN
0.0000 [IU] | Freq: Three times a day (TID) | INTRAMUSCULAR | Status: DC
  Administered 2024-05-10: 2 [IU] via SUBCUTANEOUS
  Filled 2024-05-10: qty 1

## 2024-05-10 MED ORDER — DIAZEPAM 5 MG/ML IJ SOLN
2.5000 mg | Freq: Once | INTRAMUSCULAR | Status: AC
  Administered 2024-05-10: 2.5 mg via INTRAVENOUS
  Filled 2024-05-10: qty 2

## 2024-05-10 MED ORDER — BUPIVACAINE LIPOSOME 1.3 % IJ SUSP
INTRAMUSCULAR | Status: AC
  Filled 2024-05-10: qty 10

## 2024-05-10 MED ORDER — DIPHENHYDRAMINE HCL 12.5 MG/5ML PO ELIX: 12.5000 mg | ORAL_SOLUTION | ORAL | Status: DC | PRN

## 2024-05-10 MED ORDER — MAGNESIUM HYDROXIDE 400 MG/5ML PO SUSP: 30.0000 mL | Freq: Every day | ORAL | Status: DC | PRN

## 2024-05-10 MED ORDER — OXYCODONE HCL 5 MG PO TABS
5.0000 mg | ORAL_TABLET | ORAL | Status: DC | PRN
  Administered 2024-05-10 – 2024-05-11 (×2): 5 mg via ORAL
  Filled 2024-05-10 (×2): qty 1

## 2024-05-10 MED ORDER — HYDROMORPHONE HCL 1 MG/ML IJ SOLN
0.5000 mg | INTRAMUSCULAR | Status: DC | PRN
  Administered 2024-05-10 – 2024-05-11 (×4): 1 mg via INTRAVENOUS
  Filled 2024-05-10 (×4): qty 1

## 2024-05-10 MED ORDER — SUCCINYLCHOLINE CHLORIDE 200 MG/10ML IV SOSY
PREFILLED_SYRINGE | INTRAVENOUS | Status: DC | PRN
  Administered 2024-05-10: 80 mg via INTRAVENOUS

## 2024-05-10 MED ORDER — ONDANSETRON HCL 4 MG/2ML IJ SOLN
4.0000 mg | Freq: Four times a day (QID) | INTRAMUSCULAR | Status: DC | PRN
Start: 2024-05-10 — End: 2024-05-10

## 2024-05-10 MED ORDER — PROPOFOL 10 MG/ML IV BOLUS
INTRAVENOUS | Status: AC
  Filled 2024-05-10: qty 20

## 2024-05-10 SURGICAL SUPPLY — 12 items
BNDG ADH 2 X3.75 FABRIC TAN LF (GAUZE/BANDAGES/DRESSINGS) ×1 IMPLANT
Elastic Shoulder Immobilizer IMPLANT
GAUZE SPONGE 4X4 12PLY STRL (GAUZE/BANDAGES/DRESSINGS) ×1 IMPLANT
IMMOBILIZER SHDR XL 36-42 (SOFTGOODS) IMPLANT
KIT TURNOVER KIT A (KITS) ×1 IMPLANT
NDL HYPO 21X1.5 SAFETY (NEEDLE) ×1 IMPLANT
NEEDLE HYPO 21X1.5 SAFETY (NEEDLE) IMPLANT
PAD ALCOHOL SWAB (MISCELLANEOUS) ×2 IMPLANT
SLING ARM LRG DEEP (SOFTGOODS) ×1 IMPLANT
SLING ARM M TX990204 (SOFTGOODS) ×1 IMPLANT
SOLUTION PREP PVP 2OZ (MISCELLANEOUS) ×1 IMPLANT
SYR 10ML LL (SYRINGE) ×1 IMPLANT

## 2024-05-10 NOTE — ED Notes (Signed)
 Pt refuses socks at this time.

## 2024-05-10 NOTE — Consult Note (Addendum)
 ORTHOPAEDIC CONSULTATION  REQUESTING PHYSICIAN: Roann Gouty, MD  Chief Complaint:   Right shoulder pain.  History of Present Illness: [name] is a 78 y.o. female with multiple medical problems including coronary artery disease, diabetes, hypertension, dementia, chronic kidney disease, and who has a permanent pacemaker in place was in her usual state of health today when she apparently lost her balance and fell onto her right side.  She was brought to the emergency room where x-rays demonstrated a fracture dislocation of her right shoulder.  An attempt was made to reduce the shoulder by the ER provider under conscious sedation but this was unsuccessful.  The patient is now being admitted for more definitive management of this injury.  The patient normally ambulates with a walker but was not using the walker this morning.  She apparently noted some dizziness resulting in her fall.  She did not strike her head or lose consciousness.  The patient also denies any chest pain, shortness of breath, or other symptoms which may have precipitated her fall.  Past Medical History:  Diagnosis Date   Arthritis    Confusion    Diabetes mellitus without complication (HCC)    type 2   Hepatitis    pt. denies   Hypertension    Lumbar stenosis    Memory loss    Nocturia    Numbness and tingling    legs and feet   Wears glasses    Past Surgical History:  Procedure Laterality Date   BACK SURGERY     CESAREAN SECTION  1978   COLONOSCOPY     HEMORRHOID SURGERY  2015   JOINT REPLACEMENT     01-08-18 Dr. KYM Poli   PACEMAKER LEADLESS INSERTION N/A 12/06/2021   Procedure: PACEMAKER LEADLESS INSERTION;  Surgeon: Ammon Blunt, MD;  Location: [facility];  Service: Cardiovascular;  Laterality: N/A;   TOTAL HIP ARTHROPLASTY Left 01/08/2018   Procedure: LEFT TOTAL HIP ARTHROPLASTY ANTERIOR APPROACH;  Surgeon: Poli Lonni GRADE, MD;  Location: [facility];  Service: Orthopedics;  Laterality: Left;   TRANSFORAMINAL LUMBAR INTERBODY FUSION (TLIF) WITH PEDICLE SCREW FIXATION 2 LEVEL N/A 11/07/2015   Procedure: Lumbar three-four Lumbar four-five  transforaminal lumbar interbody fusion with interbody prosthesis posterior lateral arthrodesis and posterior segmental instrumentation;  Surgeon: Reyes Budge, MD;  Location: [facility];  Service: Neurosurgery;  Laterality: N/A;   Social History   Socioeconomic History   Marital status: Divorced    Spouse name: Not on file   Number of children: 4   Years of education: Not on file   Highest education level: Associate degree: occupational, Scientist, product/process development, or vocational program  Occupational History   Not on file  Tobacco Use   Smoking status: Never    Passive exposure: Never   Smokeless tobacco: Never  Vaping Use   Vaping status: Never Used  Substance and Sexual Activity   Alcohol use: No    Comment: rarely   Drug use: No   Sexual activity: Not Currently  Other Topics Concern   Not on file  Social History Narrative   Has a living will.  Son is health care POA   Would accept resuscitation.     Would accept life support    No tube feeds if cognitively unaware   Social Drivers of Health   Financial Resource Strain: Low Risk  (12/15/2022)   Overall Financial Resource Strain (CARDIA)    Difficulty of Paying Living Expenses: Not hard at all  Food Insecurity: Food Insecurity Present (12/15/2022)   Hunger Vital Sign    Worried About Running Out of Food in the Last Year: Sometimes true    Ran Out of Food in the Last Year: Never true  Transportation Needs: Unknown (12/15/2022)   PRAPARE - Administrator, Civil Service (Medical): No    Lack of Transportation (Non-Medical): Not on file  Physical Activity: Unknown (12/15/2022)   Exercise Vital Sign    Days of Exercise per Week: 0 days    Minutes of Exercise per Session: Not on file  Stress: No Stress  Concern Present (12/15/2022)   [employer] of Occupational Health - Occupational Stress Questionnaire    Feeling of Stress : Only a little  Social Connections: Socially Isolated (12/15/2022)   Social Connection and Isolation Panel    Frequency of Communication with Friends and Family: Three times a week    Frequency of Social Gatherings with Friends and Family: Once a week    Attends Religious Services: Never    Database administrator or Organizations: No    Attends Engineer, structural: Not on file    Marital Status: Divorced   Family History  Problem Relation Age of Onset   Hypertension Other    Cancer Other        ovarian   Cancer Mother    Heart disease Father    No Known Allergies Prior to Admission medications   Medication Sig Start Date End Date Taking? Authorizing Provider  ARIPiprazole  (ABILIFY ) 2 MG tablet Take 2 mg by mouth daily. 04/27/24  Yes [provider]  aspirin  EC 81 MG tablet Take 81 mg by mouth 3 (three) times a week.   Yes [provider]  atorvastatin  (LIPITOR ) 80 MG tablet TAKE 1 TABLET BY MOUTH ONCE  DAILY 01/20/24  Yes Jimmy Charlie FERNS, MD  cholecalciferol  (VITAMIN D3) 25 MCG (1000 UNIT) tablet Take 1,000 Units by mouth daily.   Yes [provider]  donepezil  (ARICEPT ) 10 MG tablet TAKE 1 TABLET BY MOUTH EVERY  MORNING 03/17/24  Yes Letvak, Richard I, MD  DULoxetine  (CYMBALTA ) 60 MG capsule TAKE 1 CAPSULE BY MOUTH DAILY 05/04/24  Yes Letvak, Richard I, MD  gabapentin  (NEURONTIN ) 300 MG capsule Take 2 capsules (600 mg total) by mouth 2 (two) times daily. And 900 mg at bedtime 09/23/23  Yes Letvak, Richard I, MD  lisinopril -hydrochlorothiazide  (ZESTORETIC ) 20-12.5 MG tablet TAKE 2 TABLETS BY MOUTH DAILY 03/30/24  Yes Letvak, Richard I, MD  metFORMIN  (GLUCOPHAGE -XR) 500 MG 24 hr tablet Take 1 tablet (500 mg total) by mouth daily with breakfast. 04/27/24  Yes Jimmy Charlie FERNS, MD  metoprolol  succinate (TOPROL -XL) 25 MG 24 hr  tablet Take 1 tablet (25 mg total) by mouth daily. 02/02/24  Yes Letvak, Richard I, MD  Multiple Vitamins-Minerals (MULTIVITAMIN WITH MINERALS) tablet Take 1 tablet by mouth daily.    Yes [provider]  traMADol  (ULTRAM ) 50 MG tablet TAKE 1 TO 2 TABLETS BY MOUTH  EVERY 8 HOURS AS NEEDED 05/04/24  Yes Jimmy Charlie FERNS, MD  ACCU-CHEK SMARTVIEW test strip Use to check blood  sugar once daily 03/21/24   Jimmy Charlie FERNS, MD  amLODipine  (NORVASC ) 5 MG tablet Take 5 mg by mouth daily. Patient not taking: Reported on 05/10/2024    [provider]   DG Chest 1 View Result Date: 05/10/2024 CLINICAL DATA:  Shoulder dislocation, hypoxia EXAM: CHEST  1 VIEW COMPARISON:  08/29/2022 FINDINGS: The right humeral head is dislocated anteriorly and inferiorly. Large greater tuberosity fragment observed lateral to the lower glenoid. Low lung volumes are present, causing crowding of the pulmonary vasculature. Leadless pacer noted. Mild atelectasis at the lung bases. IMPRESSION: 1. Anterior inferior dislocation of the right humeral head. 2. Large right greater tuberosity fragment lateral to the lower glenoid. 3. Low lung volumes with mild atelectasis at the lung bases. Electronically Signed   By: Ryan Salvage M.D.   On: 05/10/2024 13:39   DG Shoulder Right Portable Result Date: 05/10/2024 EXAM: 1 VIEW XRAY OF THE RIGHT SHOULDER 05/10/2024 12:51:00 PM COMPARISON: Earlier today at 12:22 pm CLINICAL HISTORY: Shoulder reduction. Shoulder reduction #2; New hypoxia. FINDINGS: BONES AND JOINTS: Displaced greater tuberosity fracture fragment with Hill-Sachs lesion. Anterior dislocation of humeral head with respect to glenoid. SOFT TISSUES: No abnormal calcifications. Visualized lung is unremarkable. IMPRESSION: 1. Anterior dislocation of the right humeral head with respect to the glenoid. 2. Displaced greater tuberosity fracture fragment with Hill-Sachs lesion. 3. No significant change since earlier today.  Electronically signed by: Rockey Kilts MD 05/10/2024 01:36 PM EDT RP Workstation: [id number]   DG Shoulder Right Portable Result Date: 05/10/2024 CLINICAL DATA:  Post reduction first attempt EXAM: RIGHT SHOULDER - 1 VIEW COMPARISON:  None Available. FINDINGS: Anterior dislocation of the shoulder. Avulsion of the greater tuberosity. IMPRESSION: Persistent anterior shoulder dislocation with humeral head avulsion. Electronically Signed   By: Jackquline Boxer M.D.   On: 05/10/2024 13:31   DG Humerus Right Result Date: 05/10/2024 CLINICAL DATA:  Shoulder pain, fall EXAM: RIGHT HUMERUS - 2+ VIEW COMPARISON:  None Available. FINDINGS: Anterior subcoracoid humeral dislocation at the glenohumeral joint with adjacent osseous fragment likely representing displaced humeral head fracture fragment. IMPRESSION: Anterior humeral dislocation at the glenohumeral joint and likely humeral head fracture. Electronically Signed   By: Michaeline Blanch M.D.   On: 05/10/2024 10:50   CT Head Wo Contrast Result Date: 05/10/2024 CLINICAL DATA:  Provided history: Fall from standing.  Dizziness. EXAM: CT HEAD WITHOUT CONTRAST CT CERVICAL SPINE WITHOUT CONTRAST TECHNIQUE: Multidetector CT imaging of the head and cervical spine was performed following the standard protocol without intravenous contrast. Multiplanar CT image reconstructions of the cervical spine were also generated. RADIATION DOSE REDUCTION: This exam was performed according to the departmental dose-optimization program which includes automated exposure control, adjustment of the mA and/or kV according to patient size and/or use of iterative reconstruction technique. COMPARISON:  Brain MRI 01/05/2023. FINDINGS: CT HEAD FINDINGS Brain: Generalized cerebral atrophy. Moderate patchy and ill-defined hypoattenuation within the cerebral white matter and within/bout the basal ganglia, nonspecific but compatible with chronic small vessel ischemic disease. Known 11 mm pituitary cyst, as was  demonstrated on the prior brain MRI of 01/05/2023. No appreciable mass effect upon, or invasion of, regional structures. There is no acute intracranial hemorrhage. No demarcated cortical infarct. No extra-axial fluid collection. No evidence of an intracranial mass. No midline shift. Vascular: No hyperdense vessel.  Atherosclerotic calcifications. Skull: No calvarial fracture or aggressive osseous lesion. Sinuses/Orbits: No mass or acute finding within the imaged orbits. Trace mucosal thickening within the right maxillary sinus. Severe right anterior ethmoid and  right frontal sinusitis. CT CERVICAL SPINE FINDINGS Alignment: Non severe bursal the expected cervical lordosis. 3 mm C2-C3 grade 1 anterolisthesis. 4 mm C3-C4 grade 1 retrolisthesis. 3 mm C4-C5 grade 1 anterolisthesis. 3 mm C7-T1 grade 1 anterolisthesis. Skull base and vertebrae: The basion-dental and atlanto-dental intervals are maintained.No evidence of acute fracture to the cervical spine. Soft tissues and spinal canal: No prevertebral fluid or swelling. No visible canal hematoma. Disc levels: Cervical spondylosis with multilevel disc space narrowing, disc bulges, posterior disc osteophyte complexes, endplate spurring, uncovertebral hypertrophy and facet arthropathy. Disc space narrowing is greatest at C3-C4, C4-C5, C5-C6, C6-C7 and T1-T2 (advanced at these levels). Facet ankylosis bilaterally at C3-C4. Degenerative changes also present at the C1-C2 articulation (including degenerative cystic changes within the dens). Multilevel ventral osteophytes. Upper chest: No consolidation within the imaged lung apices. No visible pneumothorax. IMPRESSION: CT head: 1.  No evidence of an acute intracranial abnormality. 2. Parenchymal atrophy and chronic small vessel ischemic disease. 3. Known 11 mm pituitary cyst. No appreciable mass effect upon, or invasion of, regional structures. No further imaging evaluation is necessary. This follows ACR consensus guidelines:  Management of Incidental Pituitary Findings on CT, MRI and F18-FDG PET: A White Paper of the ACR Incidental Findings Committee. J Am Coll Radiol 2018; 15: 966-72. 4. Paranasal sinus disease as described. CT cervical spine: 1. No evidence of an acute cervical spine fracture. 2. Nonspecific reversal of the expected cervical lordosis. 3. Grade 1 anterolisthesis at C2-C3, C3-C4, C4-C5 and C7-T1. Electronically Signed   By: Rockey Childs D.O.   On: 05/10/2024 10:44   CT Cervical Spine Wo Contrast Result Date: 05/10/2024 CLINICAL DATA:  Provided history: Fall from standing.  Dizziness. EXAM: CT HEAD WITHOUT CONTRAST CT CERVICAL SPINE WITHOUT CONTRAST TECHNIQUE: Multidetector CT imaging of the head and cervical spine was performed following the standard protocol without intravenous contrast. Multiplanar CT image reconstructions of the cervical spine were also generated. RADIATION DOSE REDUCTION: This exam was performed according to the departmental dose-optimization program which includes automated exposure control, adjustment of the mA and/or kV according to patient size and/or use of iterative reconstruction technique. COMPARISON:  Brain MRI 01/05/2023. FINDINGS: CT HEAD FINDINGS Brain: Generalized cerebral atrophy. Moderate patchy and ill-defined hypoattenuation within the cerebral white matter and within/bout the basal ganglia, nonspecific but compatible with chronic small vessel ischemic disease. Known 11 mm pituitary cyst, as was demonstrated on the prior brain MRI of 01/05/2023. No appreciable mass effect upon, or invasion of, regional structures. There is no acute intracranial hemorrhage. No demarcated cortical infarct. No extra-axial fluid collection. No evidence of an intracranial mass. No midline shift. Vascular: No hyperdense vessel.  Atherosclerotic calcifications. Skull: No calvarial fracture or aggressive osseous lesion. Sinuses/Orbits: No mass or acute finding within the imaged orbits. Trace mucosal  thickening within the right maxillary sinus. Severe right anterior ethmoid and right frontal sinusitis. CT CERVICAL SPINE FINDINGS Alignment: Non severe bursal the expected cervical lordosis. 3 mm C2-C3 grade 1 anterolisthesis. 4 mm C3-C4 grade 1 retrolisthesis. 3 mm C4-C5 grade 1 anterolisthesis. 3 mm C7-T1 grade 1 anterolisthesis. Skull base and vertebrae: The basion-dental and atlanto-dental intervals are maintained.No evidence of acute fracture to the cervical spine. Soft tissues and spinal canal: No prevertebral fluid or swelling. No visible canal hematoma. Disc levels: Cervical spondylosis with multilevel disc space narrowing, disc bulges, posterior disc osteophyte complexes, endplate spurring, uncovertebral hypertrophy and facet arthropathy. Disc space narrowing is greatest at C3-C4, C4-C5, C5-C6, C6-C7 and T1-T2 (advanced at these levels). Facet  ankylosis bilaterally at C3-C4. Degenerative changes also present at the C1-C2 articulation (including degenerative cystic changes within the dens). Multilevel ventral osteophytes. Upper chest: No consolidation within the imaged lung apices. No visible pneumothorax. IMPRESSION: CT head: 1.  No evidence of an acute intracranial abnormality. 2. Parenchymal atrophy and chronic small vessel ischemic disease. 3. Known 11 mm pituitary cyst. No appreciable mass effect upon, or invasion of, regional structures. No further imaging evaluation is necessary. This follows ACR consensus guidelines: Management of Incidental Pituitary Findings on CT, MRI and F18-FDG PET: A White Paper of the ACR Incidental Findings Committee. J Am Coll Radiol 2018; 15: 966-72. 4. Paranasal sinus disease as described. CT cervical spine: 1. No evidence of an acute cervical spine fracture. 2. Nonspecific reversal of the expected cervical lordosis. 3. Grade 1 anterolisthesis at C2-C3, C3-C4, C4-C5 and C7-T1. Electronically Signed   By: Rockey Childs D.O.   On: 05/10/2024 10:44    Positive ROS: All  other systems have been reviewed and were otherwise negative with the exception of those mentioned in the HPI and as above.  Physical Exam: General:  Alert, no acute distress Psychiatric:  Patient is competent for consent with normal mood and affect   Cardiovascular:  No pedal edema Respiratory:  No wheezing, non-labored breathing GI:  Abdomen is soft and non-tender Skin:  No lesions in the area of chief complaint Neurologic:  Sensation intact distally Lymphatic:  No axillary or cervical lymphadenopathy  Orthopedic Exam:  Orthopedic examination is limited to the right shoulder and upper extremity.  There is an obvious deformity of the right shoulder with some indentation laterally and prominence anteriorly.  The arm also is held in about 45 degrees of abduction.  Any attempted active or passive motion of the shoulder is quite painful.  She has mild-moderate tenderness to palpation over the anterolateral aspect of the shoulder.  She is neurovascularly intact to the right upper extremity and hand, demonstrating the ability to flex and extend her wrist and all digits.  She has intact sensation to light touch to all distributions of her right upper extremity, including the axillary nerve and musculocutaneous nerve distributions.  X-rays:  Recent plain radiographs of the right shoulder, as well as a CT scan of the right shoulder are available for review and have been reviewed by myself.  These films demonstrate an anterior-inferior dislocation of the right shoulder with a displaced greater tuberosity fracture.  No significant degenerative changes of the glenohumeral joint are noted.  No lytic lesions or other acute bony processes are identified.  Assessment: Closed fracture dislocation of right shoulder.  Plan: The treatment options, including both surgical and nonsurgical choices, have been discussed in detail with the patient.  The patient would like to proceed with surgical intervention to  include a closed, possible open, reduction of the dislocated right glenohumeral joint.  The risks (including bleeding, infection, nerve and/or blood vessel injury, persistent or recurrent pain, persistent or recurrent instability, stiffness and/or weakness of the shoulder, humerus fracture, need for further surgery, blood clots, strokes, heart attacks or arrhythmias, pneumonia, etc.) and benefits of the surgical procedure were discussed. The patient states his/her understanding and agrees to proceed.  A formal written consent will be obtained by the nursing staff.  Thank you for asking me to participate in the care of this most pleasant yet unfortunate woman.  I will be happy to follow her with you.   Alexis Reyes Maltos, MD  Beeper #:  [phone]  05/10/2024  3:24 PM

## 2024-05-10 NOTE — ED Notes (Signed)
 Unable to get reading from CO2 monitor. Pt skin pink, rr even and unlabored.

## 2024-05-10 NOTE — ED Triage Notes (Signed)
 Pt to ED via [facility] from home. Pt states episode of dizziness in kitchen and fell on right shoulder, visible deformity. Denies hitting head, denies LOC, denies thinners. Pt also states pain in buttocks. Given 150 mcg fentanyl  by EMS.

## 2024-05-10 NOTE — ED Provider Notes (Signed)
 [facility] Provider Note    Event Date/Time   First MD Initiated Contact with Patient 05/10/24 [phone]     (approximate)   History   Fall   HPI  [name] is a 78 y.o. female CHB, status post Micra AV 12/06/2021, hypertension, hyperlipidemia, CHF, type 2 diabetes, CKD who presents to the emergency department after fall.  Patient normally ambulates with a walker but has not been using such.  Patient was ambulating and had an episode of dizziness and fell on her right shoulder.  Reports her dizziness has resolved but had some shortness of breath and right shoulder pain since then.  Denies any chest pain or shortness of breath.  No loss of consciousness.  She is not on any blood thinners. She reports sensation in her shoulder and upper extremity.  She received 150 mcg of fentanyl  by EMS which she tells me did not touch her pain.                 Physical Exam   Triage Vital Signs: ED Triage Vitals  Encounter Vitals Group     BP 05/10/24 0929 (!) 153/84     Girls Systolic BP Percentile --      Girls Diastolic BP Percentile --      Boys Systolic BP Percentile --      Boys Diastolic BP Percentile --      Pulse Rate 05/10/24 0929 64     Resp 05/10/24 0929 20     Temp 05/10/24 0929 98.1 F (36.7 C)     Temp Source 05/10/24 0929 Oral     SpO2 05/10/24 0929 93 %     Weight --      Height --      Head Circumference --      Peak Flow --      Pain Score 05/10/24 0930 10     Pain Loc --      Pain Education --      Exclude from Growth Chart --     Most recent vital signs: Vitals:   05/10/24 1235 05/10/24 1243  BP: (!) 115/94 123/72  Pulse: 80 77  Resp: 20 16  Temp:    SpO2: 98% 98%    Nursing Triage Note reviewed. Vital signs reviewed and patients oxygen saturation is borderline hypoxic  General: Patient is well nourished, well developed, awake and alert, appears uncomfortable,  Head: Normocephalic and atraumatic Eyes: Normal inspection,  extraocular muscles intact, no conjunctival pallor Ear, nose, throat: Normal external exam Neck: Normal range of motion Respiratory: Patient is in mild respiratory distress, lungs with rales Cardiovascular: Patient is not tachycardic, RR GI: Abd SNT with no guarding or rebound  Back: Normal inspection of the back with good strength and range of motion throughout all ext Extremities: pulses intact with good cap refills, no LE pitting edema or calf tenderness Right upper extremity with 2+ radial pulse good sensation but obvious deformity of the shoulder Neuro: The patient is alert and oriented to person, place, and time, appropriately conversive, with 5/5 bilat UE/LE strength, no gross motor or sensory defects noted. Coordination appears to be adequate. Skin: Warm, dry, and intact Psych: normal mood and affect, no SI or HI  ED Results / Procedures / Treatments   Labs (all labs ordered are listed, but only abnormal results are displayed) Labs Reviewed  CBC WITH DIFFERENTIAL/PLATELET - Abnormal; Notable for the following components:      Result Value  WBC 14.2 (*)    Hemoglobin 15.7 (*)    HCT 49.3 (*)    MCV 101.0 (*)    Neutro Abs 11.5 (*)    All other components within normal limits  BASIC METABOLIC PANEL WITH GFR - Abnormal; Notable for the following components:   Glucose, Bld 155 (*)    BUN 31 (*)    Creatinine, Ser 1.04 (*)    GFR, Estimated 56 (*)    All other components within normal limits     EKG EKG and rhythm strip are interpreted by myself:   EKG: Wide rhythm] at heart rate of 108, normal QRS duration, QTc 523, nonspecific ST segments and T waves no ectopy EKG not consistent with Acute STEMI Rhythm strip: Paced Rhythm in lead II Patient has a known left bundle branch block and this EKG does not appear significantly different from EKG on 12/07/2021 regarding left bundle branch block   RADIOLOGY Xray right shoulder: Patient has obvious deformity of right shoulder  that appears to be a dislocation and fracture    PROCEDURES:  Critical Care performed: No  .Sedation  Date/Time: 05/10/2024 12:50 PM  Performed by: Nicholaus Rolland BRAVO, MD Authorized by: Nicholaus Rolland BRAVO, MD   Consent:    Consent obtained:  Written   Consent given by:  Patient   Risks discussed:  Allergic reaction, dysrhythmia, prolonged hypoxia resulting in organ damage, prolonged sedation necessitating reversal and nausea Universal protocol:    Immediately prior to procedure, a time out was called: yes     Patient identity confirmed:  Arm band and verbally with patient Indications:    Procedure performed:  Fracture reduction   Procedure necessitating sedation performed by:  Physician performing sedation Pre-sedation assessment:    Time since last food or drink:  Patient states 4 hours ago   NPO status caution: urgency dictates proceeding with non-ideal NPO status     ASA classification: class 3 - patient with severe systemic disease     Mouth opening:  2 finger widths   Mallampati score:  II - soft palate, uvula, fauces visible   Neck mobility: reduced     Pre-sedation assessments completed and reviewed: airway patency   A pre-sedation assessment was completed prior to the start of the procedure Procedure details (see MAR for exact dosages):    Sedation:  Propofol    Intended level of sedation: deep   Intra-procedure events: hypoxia     Total Provider sedation time (minutes):  25 Post-procedure details:   A post-sedation assessment was completed following the completion of the procedure.   Procedure completion:  Tolerated well, no immediate complications .Reduction of fracture  Date/Time: 05/10/2024 12:51 PM  Performed by: Nicholaus Rolland BRAVO, MD Authorized by: Nicholaus Rolland BRAVO, MD  Consent: Verbal consent obtained. Written consent obtained Risks and benefits: risks, benefits and alternatives were discussed Consent given by: patient Patient understanding: patient states  understanding of the procedure being performed Imaging studies: imaging studies available Patient identity confirmed: verbally with patient and hospital-assigned identification number  Sedation: Patient sedated: yes  Patient tolerance: patient tolerated the procedure well with no immediate complications Comments: Unsuccessful reduction of the right humerus      MEDICATIONS ORDERED IN ED: Medications  HYDROmorphone  (DILAUDID ) injection 0.5 mg (0.5 mg Intravenous Given 05/10/24 0939)  ondansetron  (ZOFRAN ) injection 4 mg (4 mg Intravenous Given 05/10/24 0938)  morphine  (PF) 4 MG/ML injection 4 mg (4 mg Intravenous Given 05/10/24 1042)  propofol  (DIPRIVAN ) 10 mg/mL bolus/IV push 48.8  mg (48.8 mg Intravenous Given 05/10/24 1215)  diazepam  (VALIUM ) injection 2.5 mg (2.5 mg Intravenous Given 05/10/24 1311)  propofol  (DIPRIVAN ) 10 mg/mL bolus/IV push (20 mg Intravenous Given 05/10/24 1226)     IMPRESSION / MDM / ASSESSMENT AND PLAN / ED COURSE                                Differential diagnosis includes, but is not limited to, fracture, dislocation, pneumonia, CHF exacerbation, orthostasis,  ED course: Patient arrives acutely screaming in pain however she appears neurovascularly intact especially in the right upper extremity.  Given her age I did order a CT scan of her head and C-spine which was unremarkable.  EKG demonstrated a bundle branch block however this seems chronic and patient denies any chest pain.  Patient without any profound electrolyte derangements, she is not anemic and has a slight leukocytosis which may be reactive.  X-ray demonstrated a fracture dislocation of the right shoulder.  After discussion with orthopedics attempt to sedate and reduce was made.  This was a prolonged sedation with multiple attempts without success.  Patient will likely require an operative procedure and Dr. Edie requests patient remain n.p.o. and obtain a CT shoulder.  Case discussed with hospitalist for  admission.  Of note pain control has been extremely difficult in our emergency department and she has required multiple modalities   Clinical Course as of 05/10/24 1335  Tue May 10, 2024  1114 Will talk to orthopedics, consulted [HD]  1118 I am talking to Dr. Edie who is on-call currently operating he will call me back [HD]  1158 I reviewed the risks alternatives and indications for deep sedation and reduction of right shoulder with the patient who voiced verbal agreement.  She was unable to sign the form given her dominant hand [HD]  1230 Unfortunately despite prolonged sedation and multiple attempts, unable to reduce shoulder and in fact may have made fracture slightly more increased. Will page Poggi back [HD]  1309 Case discussed with hospitalist for admission [HD]    Clinical Course User Index [HD] Nicholaus Rolland BRAVO, MD     FINAL CLINICAL IMPRESSION(S) / ED DIAGNOSES   Final diagnoses:  Fall, initial encounter  Acute pain of right shoulder  Other closed nondisplaced fracture of proximal end of right humerus, initial encounter  Dislocation of right shoulder joint, initial encounter  Hypoxia     Rx / DC Orders   ED Discharge Orders     None        Note:  This document was prepared using Dragon voice recognition software and may include unintentional dictation errors.   Nicholaus Rolland BRAVO, MD 05/10/24 1336

## 2024-05-10 NOTE — H&P (Signed)
 History and Physical    [name] FMW:[id number] DOB: [birth year] DOA: 05/10/2024  DOS: the patient was seen and examined on 05/10/2024  PCP: Jimmy Charlie FERNS, MD   Patient coming from: Home  I have personally briefly reviewed patient's old medical records in [facility]  Chief Complaint: Fall at home  HPI: [name] is a pleasant 78 y.o. female with medical history significant for complete heart block s/p Micra AV 12/06/2021, HTN, HLD, HFpEF, type II DM, dementia, CKD who presented to the ED after a fall at home.  Patient has multiple medication that can cause unstable including gabapentin .  Patient uses walker normally but she was not using this morning.  Patient was ambulating and felt dizzy and fell on the floor on her right shoulder.  Her symptoms of dizziness resolved but she had some shortness of breath and right shoulder pain.  She was brought in by [facility] from home for evaluation.  She was given 150 mcg fentanyl  by EMS. Patient was seen and examined at bedside in the emergency room.  She is alert awake and oriented.  She complains of right shoulder pain and some shortness of breath otherwise no nausea no vomiting no chest pain no palpitations.  It appears that she carries a diagnosis of congestive heart failure but she is not on diuretics except hydrochlorothiazide .  ED Course: Upon arrival to the ED, patient is found to be anterior dislocation of right shoulder.  ED they tried to reduce the shoulder with propofol , Dilaudid , Zofran , morphine , diazepam .  It was not successful.  Orthopedic was called.  Patient is being evaluated by orthopedics.  Hospitalist service was consulted for evaluation for admission.  Review of Systems:  ROS  All other systems negative except as noted in the HPI.  Past Medical History:  Diagnosis Date   Arthritis    Confusion    Diabetes mellitus without complication (HCC)    type 2   Hepatitis    pt. denies   Hypertension    Lumbar  stenosis    Memory loss    Nocturia    Numbness and tingling    legs and feet   Wears glasses     Past Surgical History:  Procedure Laterality Date   BACK SURGERY     CESAREAN SECTION  1978   COLONOSCOPY     HEMORRHOID SURGERY  2015   JOINT REPLACEMENT     01-08-18 Dr. KYM Poli   PACEMAKER LEADLESS INSERTION N/A 12/06/2021   Procedure: PACEMAKER LEADLESS INSERTION;  Surgeon: Ammon Blunt, MD;  Location: [facility] INVASIVE CV LAB;  Service: Cardiovascular;  Laterality: N/A;   TOTAL HIP ARTHROPLASTY Left 01/08/2018   Procedure: LEFT TOTAL HIP ARTHROPLASTY ANTERIOR APPROACH;  Surgeon: Poli Lonni GRADE, MD;  Location: [facility] ORS;  Service: Orthopedics;  Laterality: Left;   TRANSFORAMINAL LUMBAR INTERBODY FUSION (TLIF) WITH PEDICLE SCREW FIXATION 2 LEVEL N/A 11/07/2015   Procedure: Lumbar three-four Lumbar four-five  transforaminal lumbar interbody fusion with interbody prosthesis posterior lateral arthrodesis and posterior segmental instrumentation;  Surgeon: Reyes Budge, MD;  Location: [facility] NEURO ORS;  Service: Neurosurgery;  Laterality: N/A;     reports that she has never smoked. She has never been exposed to tobacco smoke. She has never used smokeless tobacco. She reports that she does not drink alcohol and does not use drugs.  No Known Allergies  Family History  Problem Relation Age of Onset   Hypertension Other    Cancer Other  ovarian   Cancer Mother    Heart disease Father     Prior to Admission medications   Medication Sig Start Date End Date Taking? Authorizing Provider  ACCU-CHEK SMARTVIEW test strip Use to check blood sugar once daily 03/21/24   Letvak, Richard I, MD  amLODipine  (NORVASC ) 5 MG tablet Take 5 mg by mouth daily.    [provider]  aspirin  EC 81 MG tablet Take 81 mg by mouth 3 (three) times a week.    [provider]  atorvastatin  (LIPITOR ) 80 MG tablet TAKE 1 TABLET BY MOUTH ONCE  DAILY 01/20/24   Jimmy Charlie FERNS, MD   cholecalciferol  (VITAMIN D3) 25 MCG (1000 UNIT) tablet Take 1,000 Units by mouth daily.    [provider]  donepezil  (ARICEPT ) 10 MG tablet TAKE 1 TABLET BY MOUTH EVERY  MORNING 03/17/24   Jimmy Charlie I, MD  DULoxetine  (CYMBALTA ) 60 MG capsule TAKE 1 CAPSULE BY MOUTH DAILY 05/04/24   Jimmy Charlie FERNS, MD  gabapentin  (NEURONTIN ) 300 MG capsule Take 2 capsules (600 mg total) by mouth 2 (two) times daily. And 900 mg at bedtime 09/23/23   Letvak, Richard I, MD  lisinopril -hydrochlorothiazide  (ZESTORETIC ) 20-12.5 MG tablet TAKE 2 TABLETS BY MOUTH DAILY 03/30/24   Jimmy Charlie FERNS, MD  metFORMIN  (GLUCOPHAGE -XR) 500 MG 24 hr tablet Take 1 tablet (500 mg total) by mouth daily with breakfast. 04/27/24   Jimmy Charlie FERNS, MD  metoprolol  succinate (TOPROL -XL) 25 MG 24 hr tablet Take 1 tablet (25 mg total) by mouth daily. 02/02/24   Jimmy Charlie FERNS, MD  Multiple Vitamins-Minerals (MULTIVITAMIN WITH MINERALS) tablet Take 1 tablet by mouth daily.     [provider]  traMADol  (ULTRAM ) 50 MG tablet TAKE 1 TO 2 TABLETS BY MOUTH  EVERY 8 HOURS AS NEEDED 05/04/24   Jimmy Charlie FERNS, MD    Physical Exam: Vitals:   05/10/24 1223 05/10/24 1224 05/10/24 1235 05/10/24 1243  BP:   (!) 115/94 123/72  Pulse: 86 69 80 77  Resp:   20 16  Temp:      TempSrc:      SpO2: 93% 94% 98% 98%  Weight:      Height:        Physical Exam   Constitutional: Alert, awake, calm, comfortable HEENT: Neck supple Respiratory: Clear to auscultation B/L, no wheezing, no rales.  Cardiovascular: Regular rate and rhythm, no murmurs / rubs / gallops. No extremity edema. 2+ pedal pulses. No carotid bruits.  Abdomen: Soft, no tenderness, Bowel sounds positive.  Musculoskeletal: Right shoulder pain and mild swelling and multiple areas of old bruises Skin: no rashes, lesions, ulcers. Neurologic: CN 2-12 grossly intact. Sensation intact, No focal deficit identified Psychiatric: Alert and oriented x 3. Normal mood.     Labs on Admission: I have personally reviewed following labs and imaging studies  CBC: Recent Labs  Lab 05/10/24 0933  WBC 14.2*  NEUTROABS 11.5*  HGB 15.7*  HCT 49.3*  MCV 101.0*  PLT 267   Basic Metabolic Panel: Recent Labs  Lab 05/10/24 1032  NA 139  K 4.3  CL 101  CO2 26  GLUCOSE 155*  BUN 31*  CREATININE 1.04*  CALCIUM  8.9   GFR: Estimated Creatinine Clearance: 48.2 mL/min (A) (by C-G formula based on SCr of 1.04 mg/dL (H)). Liver Function Tests: No results for input(s): AST, ALT, ALKPHOS, BILITOT, PROT, ALBUMIN  in the last 168 hours. No results for input(s): LIPASE, AMYLASE in the last 168 hours. No  results for input(s): AMMONIA in the last 168 hours. Coagulation Profile: No results for input(s): INR, PROTIME in the last 168 hours. Cardiac Enzymes: No results for input(s): CKTOTAL, CKMB, CKMBINDEX, TROPONINI, TROPONINIHS in the last 168 hours. BNP (last 3 results) No results for input(s): BNP in the last 8760 hours. HbA1C: No results for input(s): HGBA1C in the last 72 hours. CBG: No results for input(s): GLUCAP in the last 168 hours. Lipid Profile: No results for input(s): CHOL, HDL, LDLCALC, TRIG, CHOLHDL, LDLDIRECT in the last 72 hours. Thyroid  Function Tests: No results for input(s): TSH, T4TOTAL, FREET4, T3FREE, THYROIDAB in the last 72 hours. Anemia Panel: No results for input(s): VITAMINB12, FOLATE, FERRITIN, TIBC, IRON, RETICCTPCT in the last 72 hours. Urine analysis:    Component Value Date/Time   COLORURINE STRAW (A) 06/10/2020 1407   APPEARANCEUR CLEAR (A) 06/10/2020 1407   LABSPEC 1.005 06/10/2020 1407   PHURINE 7.0 06/10/2020 1407   GLUCOSEU NEGATIVE 06/10/2020 1407   HGBUR NEGATIVE 06/10/2020 1407   BILIRUBINUR NEGATIVE 06/10/2020 1407   KETONESUR NEGATIVE 06/10/2020 1407   PROTEINUR NEGATIVE 06/10/2020 1407   NITRITE NEGATIVE 06/10/2020 1407   LEUKOCYTESUR  NEGATIVE 06/10/2020 1407    Radiological Exams on Admission: I have personally reviewed images DG Chest 1 View Result Date: 05/10/2024 CLINICAL DATA:  Shoulder dislocation, hypoxia EXAM: CHEST  1 VIEW COMPARISON:  08/29/2022 FINDINGS: The right humeral head is dislocated anteriorly and inferiorly. Large greater tuberosity fragment observed lateral to the lower glenoid. Low lung volumes are present, causing crowding of the pulmonary vasculature. Leadless pacer noted. Mild atelectasis at the lung bases. IMPRESSION: 1. Anterior inferior dislocation of the right humeral head. 2. Large right greater tuberosity fragment lateral to the lower glenoid. 3. Low lung volumes with mild atelectasis at the lung bases. Electronically Signed   By: Ryan Salvage M.D.   On: 05/10/2024 13:39   DG Shoulder Right Portable Result Date: 05/10/2024 EXAM: 1 VIEW XRAY OF THE RIGHT SHOULDER 05/10/2024 12:51:00 PM COMPARISON: Earlier today at 12:22 pm CLINICAL HISTORY: Shoulder reduction. Shoulder reduction #2; New hypoxia. FINDINGS: BONES AND JOINTS: Displaced greater tuberosity fracture fragment with Hill-Sachs lesion. Anterior dislocation of humeral head with respect to glenoid. SOFT TISSUES: No abnormal calcifications. Visualized lung is unremarkable. IMPRESSION: 1. Anterior dislocation of the right humeral head with respect to the glenoid. 2. Displaced greater tuberosity fracture fragment with Hill-Sachs lesion. 3. No significant change since earlier today. Electronically signed by: Rockey Kilts MD 05/10/2024 01:36 PM EDT RP Workstation: [id number]   DG Shoulder Right Portable Result Date: 05/10/2024 CLINICAL DATA:  Post reduction first attempt EXAM: RIGHT SHOULDER - 1 VIEW COMPARISON:  None Available. FINDINGS: Anterior dislocation of the shoulder. Avulsion of the greater tuberosity. IMPRESSION: Persistent anterior shoulder dislocation with humeral head avulsion. Electronically Signed   By: Jackquline Boxer M.D.   On:  05/10/2024 13:31   DG Humerus Right Result Date: 05/10/2024 CLINICAL DATA:  Shoulder pain, fall EXAM: RIGHT HUMERUS - 2+ VIEW COMPARISON:  None Available. FINDINGS: Anterior subcoracoid humeral dislocation at the glenohumeral joint with adjacent osseous fragment likely representing displaced humeral head fracture fragment. IMPRESSION: Anterior humeral dislocation at the glenohumeral joint and likely humeral head fracture. Electronically Signed   By: Michaeline Blanch M.D.   On: 05/10/2024 10:50   CT Head Wo Contrast Result Date: 05/10/2024 CLINICAL DATA:  Provided history: Fall from standing.  Dizziness. EXAM: CT HEAD WITHOUT CONTRAST CT CERVICAL SPINE WITHOUT CONTRAST TECHNIQUE: Multidetector CT imaging of the head and cervical spine  was performed following the standard protocol without intravenous contrast. Multiplanar CT image reconstructions of the cervical spine were also generated. RADIATION DOSE REDUCTION: This exam was performed according to the departmental dose-optimization program which includes automated exposure control, adjustment of the mA and/or kV according to patient size and/or use of iterative reconstruction technique. COMPARISON:  Brain MRI 01/05/2023. FINDINGS: CT HEAD FINDINGS Brain: Generalized cerebral atrophy. Moderate patchy and ill-defined hypoattenuation within the cerebral white matter and within/bout the basal ganglia, nonspecific but compatible with chronic small vessel ischemic disease. Known 11 mm pituitary cyst, as was demonstrated on the prior brain MRI of 01/05/2023. No appreciable mass effect upon, or invasion of, regional structures. There is no acute intracranial hemorrhage. No demarcated cortical infarct. No extra-axial fluid collection. No evidence of an intracranial mass. No midline shift. Vascular: No hyperdense vessel.  Atherosclerotic calcifications. Skull: No calvarial fracture or aggressive osseous lesion. Sinuses/Orbits: No mass or acute finding within the imaged  orbits. Trace mucosal thickening within the right maxillary sinus. Severe right anterior ethmoid and right frontal sinusitis. CT CERVICAL SPINE FINDINGS Alignment: Non severe bursal the expected cervical lordosis. 3 mm C2-C3 grade 1 anterolisthesis. 4 mm C3-C4 grade 1 retrolisthesis. 3 mm C4-C5 grade 1 anterolisthesis. 3 mm C7-T1 grade 1 anterolisthesis. Skull base and vertebrae: The basion-dental and atlanto-dental intervals are maintained.No evidence of acute fracture to the cervical spine. Soft tissues and spinal canal: No prevertebral fluid or swelling. No visible canal hematoma. Disc levels: Cervical spondylosis with multilevel disc space narrowing, disc bulges, posterior disc osteophyte complexes, endplate spurring, uncovertebral hypertrophy and facet arthropathy. Disc space narrowing is greatest at C3-C4, C4-C5, C5-C6, C6-C7 and T1-T2 (advanced at these levels). Facet ankylosis bilaterally at C3-C4. Degenerative changes also present at the C1-C2 articulation (including degenerative cystic changes within the dens). Multilevel ventral osteophytes. Upper chest: No consolidation within the imaged lung apices. No visible pneumothorax. IMPRESSION: CT head: 1.  No evidence of an acute intracranial abnormality. 2. Parenchymal atrophy and chronic small vessel ischemic disease. 3. Known 11 mm pituitary cyst. No appreciable mass effect upon, or invasion of, regional structures. No further imaging evaluation is necessary. This follows ACR consensus guidelines: Management of Incidental Pituitary Findings on CT, MRI and F18-FDG PET: A White Paper of the ACR Incidental Findings Committee. J Am Coll Radiol 2018; 15: 966-72. 4. Paranasal sinus disease as described. CT cervical spine: 1. No evidence of an acute cervical spine fracture. 2. Nonspecific reversal of the expected cervical lordosis. 3. Grade 1 anterolisthesis at C2-C3, C3-C4, C4-C5 and C7-T1. Electronically Signed   By: Rockey Childs D.O.   On: 05/10/2024 10:44    CT Cervical Spine Wo Contrast Result Date: 05/10/2024 CLINICAL DATA:  Provided history: Fall from standing.  Dizziness. EXAM: CT HEAD WITHOUT CONTRAST CT CERVICAL SPINE WITHOUT CONTRAST TECHNIQUE: Multidetector CT imaging of the head and cervical spine was performed following the standard protocol without intravenous contrast. Multiplanar CT image reconstructions of the cervical spine were also generated. RADIATION DOSE REDUCTION: This exam was performed according to the departmental dose-optimization program which includes automated exposure control, adjustment of the mA and/or kV according to patient size and/or use of iterative reconstruction technique. COMPARISON:  Brain MRI 01/05/2023. FINDINGS: CT HEAD FINDINGS Brain: Generalized cerebral atrophy. Moderate patchy and ill-defined hypoattenuation within the cerebral white matter and within/bout the basal ganglia, nonspecific but compatible with chronic small vessel ischemic disease. Known 11 mm pituitary cyst, as was demonstrated on the prior brain MRI of 01/05/2023. No appreciable mass effect upon,  or invasion of, regional structures. There is no acute intracranial hemorrhage. No demarcated cortical infarct. No extra-axial fluid collection. No evidence of an intracranial mass. No midline shift. Vascular: No hyperdense vessel.  Atherosclerotic calcifications. Skull: No calvarial fracture or aggressive osseous lesion. Sinuses/Orbits: No mass or acute finding within the imaged orbits. Trace mucosal thickening within the right maxillary sinus. Severe right anterior ethmoid and right frontal sinusitis. CT CERVICAL SPINE FINDINGS Alignment: Non severe bursal the expected cervical lordosis. 3 mm C2-C3 grade 1 anterolisthesis. 4 mm C3-C4 grade 1 retrolisthesis. 3 mm C4-C5 grade 1 anterolisthesis. 3 mm C7-T1 grade 1 anterolisthesis. Skull base and vertebrae: The basion-dental and atlanto-dental intervals are maintained.No evidence of acute fracture to the cervical  spine. Soft tissues and spinal canal: No prevertebral fluid or swelling. No visible canal hematoma. Disc levels: Cervical spondylosis with multilevel disc space narrowing, disc bulges, posterior disc osteophyte complexes, endplate spurring, uncovertebral hypertrophy and facet arthropathy. Disc space narrowing is greatest at C3-C4, C4-C5, C5-C6, C6-C7 and T1-T2 (advanced at these levels). Facet ankylosis bilaterally at C3-C4. Degenerative changes also present at the C1-C2 articulation (including degenerative cystic changes within the dens). Multilevel ventral osteophytes. Upper chest: No consolidation within the imaged lung apices. No visible pneumothorax. IMPRESSION: CT head: 1.  No evidence of an acute intracranial abnormality. 2. Parenchymal atrophy and chronic small vessel ischemic disease. 3. Known 11 mm pituitary cyst. No appreciable mass effect upon, or invasion of, regional structures. No further imaging evaluation is necessary. This follows ACR consensus guidelines: Management of Incidental Pituitary Findings on CT, MRI and F18-FDG PET: A White Paper of the ACR Incidental Findings Committee. J Am Coll Radiol 2018; 15: 966-72. 4. Paranasal sinus disease as described. CT cervical spine: 1. No evidence of an acute cervical spine fracture. 2. Nonspecific reversal of the expected cervical lordosis. 3. Grade 1 anterolisthesis at C2-C3, C3-C4, C4-C5 and C7-T1. Electronically Signed   By: Rockey Childs D.O.   On: 05/10/2024 10:44    EKG: My personal interpretation of EKG shows: Sinus tachycardia with paced rhythm    Assessment/Plan Principal Problem:   Anterior dislocation of right shoulder, initial encounter Active Problems:   Complete heart block (HCC)   Essential hypertension, benign   Type 2 diabetes, controlled, with neuropathy (HCC)   Morbid obesity (HCC)   MDD (major depressive disorder), recurrent, in partial remission (HCC)   Chronic pain syndrome   HLD (hyperlipidemia)   Paroxysmal atrial  fibrillation (HCC)    Assessment and Plan:  78 year old female known/PMH of complete heart block s/p pacemaker, atrial fibrillation, HTN, HLD, DM, morbid obesity, HFpEF, depression, chronic pain syndrome who was brought in after she fell at home landing on right shoulder with fracture dislocation of right shoulder.  1.  Fall/fracture dislocation of right shoulder, failed attempt to reduce in the ED - Patient will be placed in observation - She will be monitored in telemetry - Pain medications with Dilaudid  - DVT prophylaxis - Orthopedic evaluation for possible surgical procedure  2.  Hypoxemia in the setting of congestive heart failure, multiple doses of pain medications - Continue oxygen to maintain saturation more than 90% - I believe it is related to pain medication she was given for sedation - Patient was alert awake and oriented and there was no shortness of breath when I saw her. - She has a congestive heart failure but does not take any medication at home except hydrochlorothiazide   3.  HFpEF - Patient was on hydrochlorothiazide  at home - She is  looking okay - I will give her a dose of Lasix  - Continue to monitor blood pressure and oxygen.  4.  Diabetes - Continue insulin  sliding scale - Hold off oral hypoglycemic medication including metformin   5.  History of atrial fibrillation s/p pacemaker - Patient is already on metoprolol  - Not on anticoagulation  6.  HTN - Will continue monitor blood pressure - Continue metoprolol , amlodipine  and lisinopril /hydrochlorothiazide   7.  Dementia/depression - Stable - Continue Cymbalta , Abilify , Aricept   8.  HLD - Continue statin  9.  Leukocytosis at 14.2 - This may be related to stress due to dislocation of the right shoulder. - Will get a set of blood cultures. - She does not have fever or any signs of sepsis sepsis.  Will not give any antibiotic at this point.   [name] is a 78 year old female with multiple medical  problems including but not limited to congestive heart failure on beta-blocker, aspirin , atrial fibrillation, complete heart block on pacemaker, HTN, HLD, DM, depression who came in for a fall at home.  She has a right shoulder fracture dislocation.  She is being planned for surgery for her right shoulder.  It appears that she will be high risk for cardiac complication for noncardiac procedure.  Discussed with the patient and she agreed with the plan to go ahead for surgery.  States she does not appear to be volume overloaded.  She does not have any acute coronary syndromes going on.  Due to her cardiac history, it will be prudent to involve cardiology for optimization if needed.  I will call cardiology.   DVT prophylaxis: Lovenox  Code Status: Full Code Family Communication: None available Disposition Plan: Home Consults called: Orthopedic by ED provider/cardiology Admission status: Observation, Telemetry bed   Nena Rebel, MD [facility] 05/10/2024, 2:41 PM

## 2024-05-10 NOTE — Progress Notes (Signed)
 PHARMACIST - PHYSICIAN COMMUNICATION  CONCERNING:  Enoxaparin  (Lovenox ) for DVT Prophylaxis    RECOMMENDATION: Patient was prescribed enoxaprin 40mg  q24 hours for VTE prophylaxis.   Filed Weights   05/10/24 0932  Weight: 97.5 kg (215 lb)    Body mass index is 41.99 kg/m.  Estimated Creatinine Clearance: 48.2 mL/min (A) (by C-G formula based on SCr of 1.04 mg/dL (H)).   Based on [facility] policy patient is candidate for enoxaparin  0.5mg /kg TBW SQ every 24 hours based on BMI being >30.   DESCRIPTION: Pharmacy has adjusted enoxaparin  dose per [facility] policy.  Patient is now receiving enoxaparin  0.5 mg/kg every 24 hours    Adriana JONETTA Bolster, PharmD Clinical Pharmacist  05/10/2024 2:36 PM

## 2024-05-10 NOTE — Progress Notes (Signed)
 RT present and at bedside for conscious sedation for pt's shoulder dislocation.

## 2024-05-10 NOTE — Consult Note (Addendum)
 [facility] CLINIC CARDIOLOGY CONSULT NOTE       Patient ID: [name] MRN: [id number] DOB/AGE: Apr 20, 1946 78 y.o.  Admit date: 05/10/2024 Referring Physician Dr. Roann Primary Physician Jimmy Charlie FERNS, MD Primary Cardiologist Dr. Marvia Therisa Pierre, PA-C Reason for Consultation preop cardiac evaluation  HPI: [name] is a 78 y.o. female  with a past medical history of complete heart block s/p Micra AV leadless PPM (12/06/2021), paroxysmal atrial fibrillation (not on [facility]), chronic HFpEF, hypertension, hyperlipidemia, CKD D, type 2 diabetes, dementia who presented to the ED on 05/10/2024 for falling that resulted in right shoulder dislocation. Cardiology was consulted for further evaluation for preop cardiac evaluation.  Work up in the ED notable for sodium 139, potassium 4.3, creatinine 1.04, hemoglobin 15.7, platelets 267, WBC 14.2.  CT cervical without acute cervical spinal fracture.  Shoulder imaging revealed anterior-inferior dislocation of right humeral head.  CXR with no acute cardiopulmonary disease.  EKG with sinus tachycardia with LBBB/ventricular paced, rate 100s  (similar to prior EKGs).  At the time of my evaluation this afternoon, patient was resting with moderate distress due to right shoulder pain.  We discussed patient symptoms in further detail.  Patient states she was walking with her walker in her kitchen and felt dizzy and during that time decided to keep walking and ended up falling to the ground onto her right shoulder.  Patient denies any chest pain, SOB, palpitations or LOC.  Patient states currently she feels okay besides her severe right shoulder pain.  Patient states she just had pacemaker clinic yesterday and reports normal function.    Review of systems complete and found to be negative unless listed above    Past Medical History:  Diagnosis Date   Arthritis    Confusion    Diabetes mellitus without complication (HCC)    type 2   Hepatitis     pt. denies   Hypertension    Lumbar stenosis    Memory loss    Nocturia    Numbness and tingling    legs and feet   Wears glasses     Past Surgical History:  Procedure Laterality Date   BACK SURGERY     CESAREAN SECTION  1978   COLONOSCOPY     HEMORRHOID SURGERY  2015   JOINT REPLACEMENT     01-08-18 Dr. KYM Poli   PACEMAKER LEADLESS INSERTION N/A 12/06/2021   Procedure: PACEMAKER LEADLESS INSERTION;  Surgeon: Ammon Blunt, MD;  Location: [facility] INVASIVE CV LAB;  Service: Cardiovascular;  Laterality: N/A;   TOTAL HIP ARTHROPLASTY Left 01/08/2018   Procedure: LEFT TOTAL HIP ARTHROPLASTY ANTERIOR APPROACH;  Surgeon: Poli Lonni GRADE, MD;  Location: [facility] ORS;  Service: Orthopedics;  Laterality: Left;   TRANSFORAMINAL LUMBAR INTERBODY FUSION (TLIF) WITH PEDICLE SCREW FIXATION 2 LEVEL N/A 11/07/2015   Procedure: Lumbar three-four Lumbar four-five  transforaminal lumbar interbody fusion with interbody prosthesis posterior lateral arthrodesis and posterior segmental instrumentation;  Surgeon: Reyes Budge, MD;  Location: [facility] NEURO ORS;  Service: Neurosurgery;  Laterality: N/A;    (Not in a hospital admission)  Social History   Socioeconomic History   Marital status: Divorced    Spouse name: Not on file   Number of children: 4   Years of education: Not on file   Highest education level: Associate degree: occupational, Scientist, product/process development, or vocational program  Occupational History   Not on file  Tobacco Use   Smoking status: Never    Passive exposure: Never   Smokeless  tobacco: Never  Vaping Use   Vaping status: Never Used  Substance and Sexual Activity   Alcohol use: No    Comment: rarely   Drug use: No   Sexual activity: Not Currently  Other Topics Concern   Not on file  Social History Narrative   Has a living will.   Son is health care POA   Would accept resuscitation.     Would accept life support    No tube feeds if cognitively unaware   Social Drivers of  Health   Financial Resource Strain: Low Risk  (12/15/2022)   Overall Financial Resource Strain (CARDIA)    Difficulty of Paying Living Expenses: Not hard at all  Food Insecurity: Food Insecurity Present (12/15/2022)   Hunger Vital Sign    Worried About Running Out of Food in the Last Year: Sometimes true    Ran Out of Food in the Last Year: Never true  Transportation Needs: Unknown (12/15/2022)   PRAPARE - Administrator, Civil Service (Medical): No    Lack of Transportation (Non-Medical): Not on file  Physical Activity: Unknown (12/15/2022)   Exercise Vital Sign    Days of Exercise per Week: 0 days    Minutes of Exercise per Session: Not on file  Stress: No Stress Concern Present (12/15/2022)   [employer] of Occupational Health - Occupational Stress Questionnaire    Feeling of Stress : Only a little  Social Connections: Socially Isolated (12/15/2022)   Social Connection and Isolation Panel    Frequency of Communication with Friends and Family: Three times a week    Frequency of Social Gatherings with Friends and Family: Once a week    Attends Religious Services: Never    Database administrator or Organizations: No    Attends Engineer, structural: Not on file    Marital Status: Divorced  Intimate Partner Violence: Not At Risk (11/20/2017)   Humiliation, Afraid, Rape, and Kick questionnaire    Fear of Current or Ex-Partner: No    Emotionally Abused: No    Physically Abused: No    Sexually Abused: No    Family History  Problem Relation Age of Onset   Hypertension Other    Cancer Other        ovarian   Cancer Mother    Heart disease Father      Vitals:   05/10/24 1223 05/10/24 1224 05/10/24 1235 05/10/24 1243  BP:   (!) 115/94 123/72  Pulse: 86 69 80 77  Resp:   20 16  Temp:      TempSrc:      SpO2: 93% 94% 98% 98%  Weight:      Height:        PHYSICAL EXAM General: Ill-appearing elderly female, well nourished, in mild acute distress. HEENT:  Normocephalic and atraumatic. Neck: No JVD.   Lungs: Normal respiratory effort on 2L.  Diminished breath sounds bilaterally. Heart: HRRR. Normal S1 and S2 without gallops or murmurs.  Abdomen: Non-distended appearing.  Msk: Normal strength and tone for age. Extremities: Warm and well perfused. No clubbing, cyanosis. + pedal edema.  Neuro: Alert and oriented X 3. Psych: Answers questions appropriately.   Labs: Basic Metabolic Panel: Recent Labs    05/10/24 1032  NA 139  K 4.3  CL 101  CO2 26  GLUCOSE 155*  BUN 31*  CREATININE 1.04*  CALCIUM  8.9   Liver Function Tests: No results for input(s): AST, ALT, ALKPHOS, BILITOT, PROT, ALBUMIN   in the last 72 hours. No results for input(s): LIPASE, AMYLASE in the last 72 hours. CBC: Recent Labs    05/10/24 0933  WBC 14.2*  NEUTROABS 11.5*  HGB 15.7*  HCT 49.3*  MCV 101.0*  PLT 267   Cardiac Enzymes: No results for input(s): CKTOTAL, CKMB, CKMBINDEX, TROPONINIHS in the last 72 hours. BNP: No results for input(s): BNP in the last 72 hours. D-Dimer: No results for input(s): DDIMER in the last 72 hours. Hemoglobin A1C: No results for input(s): HGBA1C in the last 72 hours. Fasting Lipid Panel: No results for input(s): CHOL, HDL, LDLCALC, TRIG, CHOLHDL, LDLDIRECT in the last 72 hours. Thyroid  Function Tests: No results for input(s): TSH, T4TOTAL, T3FREE, THYROIDAB in the last 72 hours.  Invalid input(s): FREET3 Anemia Panel: No results for input(s): VITAMINB12, FOLATE, FERRITIN, TIBC, IRON, RETICCTPCT in the last 72 hours.   Radiology: DG Chest 1 View Result Date: 05/10/2024 CLINICAL DATA:  Shoulder dislocation, hypoxia EXAM: CHEST  1 VIEW COMPARISON:  08/29/2022 FINDINGS: The right humeral head is dislocated anteriorly and inferiorly. Large greater tuberosity fragment observed lateral to the lower glenoid. Low lung volumes are present, causing crowding of the  pulmonary vasculature. Leadless pacer noted. Mild atelectasis at the lung bases. IMPRESSION: 1. Anterior inferior dislocation of the right humeral head. 2. Large right greater tuberosity fragment lateral to the lower glenoid. 3. Low lung volumes with mild atelectasis at the lung bases. Electronically Signed   By: Ryan Salvage M.D.   On: 05/10/2024 13:39   DG Shoulder Right Portable Result Date: 05/10/2024 EXAM: 1 VIEW XRAY OF THE RIGHT SHOULDER 05/10/2024 12:51:00 PM COMPARISON: Earlier today at 12:22 pm CLINICAL HISTORY: Shoulder reduction. Shoulder reduction #2; New hypoxia. FINDINGS: BONES AND JOINTS: Displaced greater tuberosity fracture fragment with Hill-Sachs lesion. Anterior dislocation of humeral head with respect to glenoid. SOFT TISSUES: No abnormal calcifications. Visualized lung is unremarkable. IMPRESSION: 1. Anterior dislocation of the right humeral head with respect to the glenoid. 2. Displaced greater tuberosity fracture fragment with Hill-Sachs lesion. 3. No significant change since earlier today. Electronically signed by: Rockey Kilts MD 05/10/2024 01:36 PM EDT RP Workstation: [id number]   DG Shoulder Right Portable Result Date: 05/10/2024 CLINICAL DATA:  Post reduction first attempt EXAM: RIGHT SHOULDER - 1 VIEW COMPARISON:  None Available. FINDINGS: Anterior dislocation of the shoulder. Avulsion of the greater tuberosity. IMPRESSION: Persistent anterior shoulder dislocation with humeral head avulsion. Electronically Signed   By: Jackquline Boxer M.D.   On: 05/10/2024 13:31   DG Humerus Right Result Date: 05/10/2024 CLINICAL DATA:  Shoulder pain, fall EXAM: RIGHT HUMERUS - 2+ VIEW COMPARISON:  None Available. FINDINGS: Anterior subcoracoid humeral dislocation at the glenohumeral joint with adjacent osseous fragment likely representing displaced humeral head fracture fragment. IMPRESSION: Anterior humeral dislocation at the glenohumeral joint and likely humeral head fracture.  Electronically Signed   By: Michaeline Blanch M.D.   On: 05/10/2024 10:50   CT Head Wo Contrast Result Date: 05/10/2024 CLINICAL DATA:  Provided history: Fall from standing.  Dizziness. EXAM: CT HEAD WITHOUT CONTRAST CT CERVICAL SPINE WITHOUT CONTRAST TECHNIQUE: Multidetector CT imaging of the head and cervical spine was performed following the standard protocol without intravenous contrast. Multiplanar CT image reconstructions of the cervical spine were also generated. RADIATION DOSE REDUCTION: This exam was performed according to the departmental dose-optimization program which includes automated exposure control, adjustment of the mA and/or kV according to patient size and/or use of iterative reconstruction technique. COMPARISON:  Brain MRI 01/05/2023. FINDINGS: CT HEAD  FINDINGS Brain: Generalized cerebral atrophy. Moderate patchy and ill-defined hypoattenuation within the cerebral white matter and within/bout the basal ganglia, nonspecific but compatible with chronic small vessel ischemic disease. Known 11 mm pituitary cyst, as was demonstrated on the prior brain MRI of 01/05/2023. No appreciable mass effect upon, or invasion of, regional structures. There is no acute intracranial hemorrhage. No demarcated cortical infarct. No extra-axial fluid collection. No evidence of an intracranial mass. No midline shift. Vascular: No hyperdense vessel.  Atherosclerotic calcifications. Skull: No calvarial fracture or aggressive osseous lesion. Sinuses/Orbits: No mass or acute finding within the imaged orbits. Trace mucosal thickening within the right maxillary sinus. Severe right anterior ethmoid and right frontal sinusitis. CT CERVICAL SPINE FINDINGS Alignment: Non severe bursal the expected cervical lordosis. 3 mm C2-C3 grade 1 anterolisthesis. 4 mm C3-C4 grade 1 retrolisthesis. 3 mm C4-C5 grade 1 anterolisthesis. 3 mm C7-T1 grade 1 anterolisthesis. Skull base and vertebrae: The basion-dental and atlanto-dental intervals are  maintained.No evidence of acute fracture to the cervical spine. Soft tissues and spinal canal: No prevertebral fluid or swelling. No visible canal hematoma. Disc levels: Cervical spondylosis with multilevel disc space narrowing, disc bulges, posterior disc osteophyte complexes, endplate spurring, uncovertebral hypertrophy and facet arthropathy. Disc space narrowing is greatest at C3-C4, C4-C5, C5-C6, C6-C7 and T1-T2 (advanced at these levels). Facet ankylosis bilaterally at C3-C4. Degenerative changes also present at the C1-C2 articulation (including degenerative cystic changes within the dens). Multilevel ventral osteophytes. Upper chest: No consolidation within the imaged lung apices. No visible pneumothorax. IMPRESSION: CT head: 1.  No evidence of an acute intracranial abnormality. 2. Parenchymal atrophy and chronic small vessel ischemic disease. 3. Known 11 mm pituitary cyst. No appreciable mass effect upon, or invasion of, regional structures. No further imaging evaluation is necessary. This follows ACR consensus guidelines: Management of Incidental Pituitary Findings on CT, MRI and F18-FDG PET: A White Paper of the ACR Incidental Findings Committee. J Am Coll Radiol 2018; 15: 966-72. 4. Paranasal sinus disease as described. CT cervical spine: 1. No evidence of an acute cervical spine fracture. 2. Nonspecific reversal of the expected cervical lordosis. 3. Grade 1 anterolisthesis at C2-C3, C3-C4, C4-C5 and C7-T1. Electronically Signed   By: Rockey Childs D.O.   On: 05/10/2024 10:44   CT Cervical Spine Wo Contrast Result Date: 05/10/2024 CLINICAL DATA:  Provided history: Fall from standing.  Dizziness. EXAM: CT HEAD WITHOUT CONTRAST CT CERVICAL SPINE WITHOUT CONTRAST TECHNIQUE: Multidetector CT imaging of the head and cervical spine was performed following the standard protocol without intravenous contrast. Multiplanar CT image reconstructions of the cervical spine were also generated. RADIATION DOSE REDUCTION:  This exam was performed according to the departmental dose-optimization program which includes automated exposure control, adjustment of the mA and/or kV according to patient size and/or use of iterative reconstruction technique. COMPARISON:  Brain MRI 01/05/2023. FINDINGS: CT HEAD FINDINGS Brain: Generalized cerebral atrophy. Moderate patchy and ill-defined hypoattenuation within the cerebral white matter and within/bout the basal ganglia, nonspecific but compatible with chronic small vessel ischemic disease. Known 11 mm pituitary cyst, as was demonstrated on the prior brain MRI of 01/05/2023. No appreciable mass effect upon, or invasion of, regional structures. There is no acute intracranial hemorrhage. No demarcated cortical infarct. No extra-axial fluid collection. No evidence of an intracranial mass. No midline shift. Vascular: No hyperdense vessel.  Atherosclerotic calcifications. Skull: No calvarial fracture or aggressive osseous lesion. Sinuses/Orbits: No mass or acute finding within the imaged orbits. Trace mucosal thickening within the right maxillary sinus. Severe  right anterior ethmoid and right frontal sinusitis. CT CERVICAL SPINE FINDINGS Alignment: Non severe bursal the expected cervical lordosis. 3 mm C2-C3 grade 1 anterolisthesis. 4 mm C3-C4 grade 1 retrolisthesis. 3 mm C4-C5 grade 1 anterolisthesis. 3 mm C7-T1 grade 1 anterolisthesis. Skull base and vertebrae: The basion-dental and atlanto-dental intervals are maintained.No evidence of acute fracture to the cervical spine. Soft tissues and spinal canal: No prevertebral fluid or swelling. No visible canal hematoma. Disc levels: Cervical spondylosis with multilevel disc space narrowing, disc bulges, posterior disc osteophyte complexes, endplate spurring, uncovertebral hypertrophy and facet arthropathy. Disc space narrowing is greatest at C3-C4, C4-C5, C5-C6, C6-C7 and T1-T2 (advanced at these levels). Facet ankylosis bilaterally at C3-C4.  Degenerative changes also present at the C1-C2 articulation (including degenerative cystic changes within the dens). Multilevel ventral osteophytes. Upper chest: No consolidation within the imaged lung apices. No visible pneumothorax. IMPRESSION: CT head: 1.  No evidence of an acute intracranial abnormality. 2. Parenchymal atrophy and chronic small vessel ischemic disease. 3. Known 11 mm pituitary cyst. No appreciable mass effect upon, or invasion of, regional structures. No further imaging evaluation is necessary. This follows ACR consensus guidelines: Management of Incidental Pituitary Findings on CT, MRI and F18-FDG PET: A White Paper of the ACR Incidental Findings Committee. J Am Coll Radiol 2018; 15: 966-72. 4. Paranasal sinus disease as described. CT cervical spine: 1. No evidence of an acute cervical spine fracture. 2. Nonspecific reversal of the expected cervical lordosis. 3. Grade 1 anterolisthesis at C2-C3, C3-C4, C4-C5 and C7-T1. Electronically Signed   By: Rockey Childs D.O.   On: 05/10/2024 10:44    ECHO 06/11/2020  1. Left ventricular ejection fraction, by estimation, is 60 to 65%. Left ventricular ejection fraction by PLAX is 64 %. The left ventricle has normal function. The left ventricle has no regional wall motion abnormalities. There is moderate left ventricular hypertrophy. Left ventricular diastolic parameters are consistent with Grade I diastolic dysfunction (impaired relaxation).   2. Right ventricular systolic function was not well visualized. The right ventricular size is normal.   3. Left atrial size was mildly dilated.   4. The mitral valve is grossly normal. Trivial mitral valve regurgitation.   5. The aortic valve was not well visualized. There is mild calcification of the aortic valve. Aortic valve regurgitation is not visualized.    TELEMETRY reviewed by me 05/10/2024: Rhythm, rate 70s  EKG reviewed by me: sinus tachycardia with LBBB/ventricular paced, rate 100s  (similar to  prior EKGs)  Data reviewed by me 05/10/2024: last 24h vitals tele labs imaging I/O ED provider note, admission H&P.  Principal Problem:   Anterior dislocation of right shoulder, initial encounter Active Problems:   Essential hypertension, benign   Type 2 diabetes, controlled, with neuropathy (HCC)   Morbid obesity (HCC)   MDD (major depressive disorder), recurrent, in partial remission (HCC)   Chronic pain syndrome   Complete heart block (HCC)   HLD (hyperlipidemia)   Paroxysmal atrial fibrillation (HCC)    ASSESSMENT AND PLAN:  [name] is a 78 y.o. female  with a past medical history of complete heart block s/p Micra AV leadless PPM (12/06/2021), paroxysmal atrial fibrillation (not on [facility]), chronic HFpEF, hypertension, hyperlipidemia, CKD D, type 2 diabetes, dementia who presented to the ED on 05/10/2024 for falling that resulted in right shoulder dislocation. Cardiology was consulted for further evaluation for preop cardiac evaluation.  # Right anterior shoulder dislocation s/p fall # Preoperative cardiac evaluation -Orthopedics consulted, plan for surgical intervention  this afternoon.  Shoulder was not able to be reduced in ED. -Patient is at elevated but acceptable risk for proceeding with surgery given cardiac history understanding risk/benefits.  Can proceed with shoulder intervention for dislocation with Dr. Edie without further cardiac diagnostics needed.    # CHB s/p micra AV leadless pacemaker (12/06/21) # Paroxysmal atrial fibrillation EKG with sinus tachycardia with LBBB/ventricular paced, rate 100s  (similar to prior EKGs)and per sinus rhythm, rate 70s with paroxysmal A-fib. -Monitor and replenish electrolytes for a goal K >4, Mag >2  -Will continue to closely monitor telemetry. -Continue metoprolol  as stated below. -Patient not on anticoagulation for AF, will have discussions with patient s/p shoulder intervention. Per prior outpatient cardiology note, patient has  low AF burden, initiated on ASA at that time (12/2023).  -Will plan to interrogate micra tomorrow to assess AF burden and eval any arrhthymias occurred prior/after fall.  # Chronic HFpEF Patient appears near euvolemic, laying almost flat.  -Patient does not take any HF medications, will optimize as BP/renal function allows after patient is stabilized with primary problem of right shoulder dislocation. -Recommend initiating SGLT2 inhibitor at outpatient cardiology follow-up.  # Hypertension # Hyperlipidemia -Continue aspirin  81 mg, atorvastatin  80 mg daily. -Continue home amlodipine  5 mg daily. -Continue metoprolol  succinate 25 mg daily. -Continue home lisinopril -HCTZ.   This patient's plan of care was discussed and created with Dr. Florencio and he is in agreement.  Signed: Dorene Comfort, PA-C  05/10/2024, 2:58 PM [facility]

## 2024-05-10 NOTE — Anesthesia Procedure Notes (Signed)
 Procedure Name: MAC Date/Time: 05/10/2024 5:53 PM  Performed by: Lacretia Camelia NOVAK, CRNAPre-anesthesia Checklist: Patient identified, Emergency Drugs available, Suction available and Patient being monitored Patient Re-evaluated:Patient Re-evaluated prior to induction Oxygen Delivery Method: Simple face mask

## 2024-05-10 NOTE — ED Notes (Signed)
 Pt placed on 2L O2 nasal cannula due to O2 in mid 80s. O2 currently 96% on 2L.

## 2024-05-10 NOTE — Op Note (Signed)
 05/10/2024  6:19 PM  Patient:   [name]  Pre-Op Diagnosis:   Closed fracture dislocation right shoulder  Post-Op Diagnosis:   Same with proximal humerus fracture  Procedure:   Attempted closed reduction of right shoulder fracture dislocation.  Surgeon:   DOROTHA Reyes Maltos, MD  Assistant:   Gustavo Level, PA-C  Anesthesia:   IV sedation  Findings:   As above.  Complications:   Iatrogenic proximal humerus fracture.  Fluids:   100 cc crystalloid  EBL:   0 cc  UOP:   None  TT:   None  Drains:   None  Closure:   None  Brief Clinical Note:   The patient is a 78 year old female who sustained the above-noted injury this morning when she lost her balance and fell in her home.  She presented to the emergency room where x-rays demonstrated a fracture dislocation of the shoulder.  An attempt was made to reduce the fracture in the emergency room but this was unsuccessful.  Therefore, the patient, after being cleared medically and by Cardiology, was brought to the operating room for closed reduction, possible open reduction of the right shoulder fracture dislocation.  Procedure:   The patient was brought into the operating room and laid in the supine position.  After adequate IV sedation was achieved, several attempts were made to reduce the fracture with traction/countertraction, including using the C arm to help guide reduction maneuvers.  Despite numerous attempts, this was unsuccessful.  Therefore, anesthesia gave succinylcholine  to provide temporary paralysis and repeat efforts were made to reduce the glenohumeral joint.  During one of these maneuvers, a crack was felt and heard.  A proximal humeral shaft fracture was identified C arm imaging.  After considering the options, it was felt best to place the patient in a shoulder immobilizer, wake her up, and reconsider our options as the equipment necessary for fixing her proximal humerus fracture was not available.  The patient was  awakened and returned to the recovery room in satisfactory condition after tolerating the procedure well.  She was neurovascularly intact to the right upper extremity and hand in the recovery room.

## 2024-05-10 NOTE — Anesthesia Preprocedure Evaluation (Addendum)
 Anesthesia Evaluation  Patient identified by MRN, date of birth, ID band Patient awake    Reviewed: Allergy & Precautions, H&P , NPO status , Patient's Chart, lab work & pertinent test results  Airway Mallampati: III  TM Distance: >3 FB Neck ROM: full    Dental  (+) Missing, Poor Dentition   Pulmonary asthma    Pulmonary exam normal        Cardiovascular Exercise Tolerance: Poor hypertension, +CHF (Chronic diastolic CHF)  Normal cardiovascular exam+ dysrhythmias (paroxysmal atrial fibrillation (not on [facility])) + pacemaker   complete heart block s/p Micra AV leadless PPM (12/06/2021)  EKG with sinus tachycardia with LBBB/ventricular paced, rate 100s  (similar to prior EKGs)and per sinus rhythm, rate 70s with paroxysmal A-fib.  ECHO 06/11/2020  1. Left ventricular ejection fraction, by estimation, is 60 to 65%. Left ventricular ejection fraction by PLAX is 64 %. The left ventricle has normal function. The left ventricle has no regional wall motion abnormalities. There is moderate left ventricular hypertrophy. Left ventricular diastolic parameters are consistent with Grade I diastolic dysfunction (impaired relaxation).   2. Right ventricular systolic function was not well visualized. The right ventricular size is normal.   3. Left atrial size was mildly dilated.   4. The mitral valve is grossly normal. Trivial mitral valve regurgitation.   5. The aortic valve was not well visualized. There is mild calcification of the aortic valve. Aortic valve regurgitation is not visualized.     Neuro/Psych  PSYCHIATRIC DISORDERS  Depression    mild cognitive impairmentFailed back surgical syndrome   walking with her walker  Neuromuscular disease (PN) CVA    GI/Hepatic negative GI ROS, Neg liver ROS,,,  Endo/Other  diabetes, Type 2  Class 3 obesity  Renal/GU Renal InsufficiencyRenal disease  negative genitourinary   Musculoskeletal    Abdominal  (+) + obese  Peds  Hematology negative hematology ROS (+)   Anesthesia Other Findings FFS-  Right anterior shoulder dislocation s/p fall  Past Medical History: No date: Arthritis No date: Confusion No date: Diabetes mellitus without complication (HCC)     Comment:  type 2 No date: Hepatitis     Comment:  pt. denies No date: Hypertension No date: Lumbar stenosis No date: Memory loss No date: Nocturia No date: Numbness and tingling     Comment:  legs and feet No date: Wears glasses  Past Surgical History: No date: BACK SURGERY 1978: CESAREAN SECTION No date: COLONOSCOPY 2015: HEMORRHOID SURGERY No date: JOINT REPLACEMENT     Comment:  01-08-18 Dr. KYM Poli 12/06/2021: PACEMAKER LEADLESS INSERTION; N/A     Comment:  Procedure: PACEMAKER LEADLESS INSERTION;  Surgeon:               Ammon Blunt, MD;  Location: [facility] INVASIVE CV               LAB;  Service: Cardiovascular;  Laterality: N/A; 01/08/2018: TOTAL HIP ARTHROPLASTY; Left     Comment:  Procedure: LEFT TOTAL HIP ARTHROPLASTY ANTERIOR               APPROACH;  Surgeon: Poli Lonni GRADE, MD;                Location: [facility] ORS;  Service: Orthopedics;  Laterality:               Left; 11/07/2015: TRANSFORAMINAL LUMBAR INTERBODY FUSION (TLIF) WITH  PEDICLE SCREW FIXATION 2 LEVEL; N/A     Comment:  Procedure: Lumbar three-four Lumbar four-five  transforaminal lumbar interbody fusion with interbody               prosthesis posterior lateral arthrodesis and posterior               segmental instrumentation;  Surgeon: Reyes Budge, MD;              Location: [facility] NEURO ORS;  Service: Neurosurgery;                Laterality: N/A;  BMI    Body Mass Index: 41.99 kg/m      Reproductive/Obstetrics negative OB ROS                              Anesthesia Physical Anesthesia Plan  ASA: 3  Anesthesia Plan: General   Post-op Pain Management: Ofirmev  IV  (intra-op)* and Dilaudid  IV   Induction: Intravenous  PONV Risk Score and Plan: Propofol  infusion and TIVA  Airway Management Planned: Natural Airway and Mask  Additional Equipment:   Intra-op Plan:   Post-operative Plan:   Informed Consent: I have reviewed the patients History and Physical, chart, labs and discussed the procedure including the risks, benefits and alternatives for the proposed anesthesia with the patient or authorized representative who has indicated his/her understanding and acceptance.       Plan Discussed with: CRNA and Surgeon  Anesthesia Plan Comments:          Anesthesia Quick Evaluation

## 2024-05-10 NOTE — Transfer of Care (Signed)
 Immediate Anesthesia Transfer of Care Note  Patient: [name]  Procedure(s) Performed: CLOSED REDUCTION, SHOULDER (Right: Shoulder)  Patient Location: PACU  Anesthesia Type:General  Level of Consciousness: drowsy and patient cooperative  Airway & Oxygen Therapy: Patient Spontanous Breathing and Patient connected to face mask oxygen  Post-op Assessment: Report given to RN and Post -op Vital signs reviewed and stable  Post vital signs: Reviewed and stable  Last Vitals:  Vitals Value Taken Time  BP 116/72 (82)   Temp    Pulse 72 05/10/24 18:24  Resp 19 05/10/24 18:24  SpO2 97 % 05/10/24 18:24  Vitals shown include unfiled device data.  Last Pain:  Vitals:   05/10/24 1629  TempSrc: Oral  PainSc: 10-Worst pain ever         Complications: No notable events documented.

## 2024-05-10 NOTE — ED Notes (Signed)
 Lab called to collect blood cultures.

## 2024-05-10 NOTE — ED Notes (Signed)
 CO2 monitor and fluids set up at bedside

## 2024-05-10 NOTE — Plan of Care (Signed)
   Problem: Clinical Measurements: Goal: Ability to maintain clinical measurements within normal limits will improve Outcome: Progressing

## 2024-05-11 ENCOUNTER — Inpatient Hospital Stay (HOSPITAL_COMMUNITY)

## 2024-05-11 ENCOUNTER — Encounter (HOSPITAL_COMMUNITY): Payer: Self-pay | Admitting: Student

## 2024-05-11 ENCOUNTER — Encounter (HOSPITAL_COMMUNITY)
Admission: AD | Disposition: A | Discharge status: Skilled Nursing Facility | Payer: Self-pay | Source: Other Acute Inpatient Hospital | Attending: Internal Medicine

## 2024-05-11 ENCOUNTER — Inpatient Hospital Stay (HOSPITAL_COMMUNITY)
Admission: AD | Admit: 2024-05-11 | Discharge: 2024-05-16 | DRG: 493 | Disposition: A | Discharge status: Skilled Nursing Facility | Source: Other Acute Inpatient Hospital | Attending: Family Medicine | Admitting: Family Medicine

## 2024-05-11 ENCOUNTER — Observation Stay (HOSPITAL_COMMUNITY)

## 2024-05-11 ENCOUNTER — Other Ambulatory Visit: Payer: Self-pay

## 2024-05-11 DIAGNOSIS — W1830XA Fall on same level, unspecified, initial encounter: Secondary | ICD-10-CM | POA: Diagnosis present

## 2024-05-11 DIAGNOSIS — G894 Chronic pain syndrome: Secondary | ICD-10-CM

## 2024-05-11 DIAGNOSIS — Z743 Need for continuous supervision: Secondary | ICD-10-CM | POA: Diagnosis not present

## 2024-05-11 DIAGNOSIS — S42294A Other nondisplaced fracture of upper end of right humerus, initial encounter for closed fracture: Secondary | ICD-10-CM

## 2024-05-11 DIAGNOSIS — F0390 Unspecified dementia without behavioral disturbance: Secondary | ICD-10-CM | POA: Diagnosis present

## 2024-05-11 DIAGNOSIS — Y92009 Unspecified place in unspecified non-institutional (private) residence as the place of occurrence of the external cause: Secondary | ICD-10-CM | POA: Diagnosis not present

## 2024-05-11 DIAGNOSIS — S42201A Unspecified fracture of upper end of right humerus, initial encounter for closed fracture: Secondary | ICD-10-CM

## 2024-05-11 DIAGNOSIS — Z950 Presence of cardiac pacemaker: Secondary | ICD-10-CM

## 2024-05-11 DIAGNOSIS — I130 Hypertensive heart and chronic kidney disease with heart failure and stage 1 through stage 4 chronic kidney disease, or unspecified chronic kidney disease: Secondary | ICD-10-CM | POA: Diagnosis not present

## 2024-05-11 DIAGNOSIS — E119 Type 2 diabetes mellitus without complications: Secondary | ICD-10-CM | POA: Diagnosis not present

## 2024-05-11 DIAGNOSIS — Z8249 Family history of ischemic heart disease and other diseases of the circulatory system: Secondary | ICD-10-CM

## 2024-05-11 DIAGNOSIS — E785 Hyperlipidemia, unspecified: Secondary | ICD-10-CM | POA: Diagnosis not present

## 2024-05-11 DIAGNOSIS — E1122 Type 2 diabetes mellitus with diabetic chronic kidney disease: Secondary | ICD-10-CM | POA: Diagnosis present

## 2024-05-11 DIAGNOSIS — R609 Edema, unspecified: Secondary | ICD-10-CM | POA: Diagnosis not present

## 2024-05-11 DIAGNOSIS — E66813 Obesity, class 3: Secondary | ICD-10-CM | POA: Diagnosis present

## 2024-05-11 DIAGNOSIS — I110 Hypertensive heart disease with heart failure: Secondary | ICD-10-CM

## 2024-05-11 DIAGNOSIS — Z79899 Other long term (current) drug therapy: Secondary | ICD-10-CM | POA: Diagnosis not present

## 2024-05-11 DIAGNOSIS — Z6841 Body Mass Index (BMI) 40.0 and over, adult: Secondary | ICD-10-CM | POA: Diagnosis not present

## 2024-05-11 DIAGNOSIS — N179 Acute kidney failure, unspecified: Secondary | ICD-10-CM | POA: Diagnosis not present

## 2024-05-11 DIAGNOSIS — W19XXXA Unspecified fall, initial encounter: Secondary | ICD-10-CM

## 2024-05-11 DIAGNOSIS — S4291XA Fracture of right shoulder girdle, part unspecified, initial encounter for closed fracture: Secondary | ICD-10-CM | POA: Diagnosis present

## 2024-05-11 DIAGNOSIS — S43014A Anterior dislocation of right humerus, initial encounter: Secondary | ICD-10-CM | POA: Diagnosis not present

## 2024-05-11 DIAGNOSIS — E7849 Other hyperlipidemia: Secondary | ICD-10-CM | POA: Diagnosis not present

## 2024-05-11 DIAGNOSIS — E1140 Type 2 diabetes mellitus with diabetic neuropathy, unspecified: Secondary | ICD-10-CM | POA: Diagnosis not present

## 2024-05-11 DIAGNOSIS — I10 Essential (primary) hypertension: Secondary | ICD-10-CM | POA: Diagnosis present

## 2024-05-11 DIAGNOSIS — D62 Acute posthemorrhagic anemia: Secondary | ICD-10-CM | POA: Diagnosis not present

## 2024-05-11 DIAGNOSIS — I5030 Unspecified diastolic (congestive) heart failure: Secondary | ICD-10-CM | POA: Diagnosis present

## 2024-05-11 DIAGNOSIS — R0902 Hypoxemia: Secondary | ICD-10-CM | POA: Diagnosis not present

## 2024-05-11 DIAGNOSIS — D72829 Elevated white blood cell count, unspecified: Secondary | ICD-10-CM | POA: Diagnosis present

## 2024-05-11 DIAGNOSIS — Z7982 Long term (current) use of aspirin: Secondary | ICD-10-CM

## 2024-05-11 DIAGNOSIS — R2689 Other abnormalities of gait and mobility: Secondary | ICD-10-CM | POA: Diagnosis not present

## 2024-05-11 DIAGNOSIS — M6281 Muscle weakness (generalized): Secondary | ICD-10-CM | POA: Diagnosis not present

## 2024-05-11 DIAGNOSIS — I442 Atrioventricular block, complete: Secondary | ICD-10-CM | POA: Diagnosis not present

## 2024-05-11 DIAGNOSIS — Z809 Family history of malignant neoplasm, unspecified: Secondary | ICD-10-CM

## 2024-05-11 DIAGNOSIS — N189 Chronic kidney disease, unspecified: Secondary | ICD-10-CM | POA: Diagnosis present

## 2024-05-11 DIAGNOSIS — I480 Paroxysmal atrial fibrillation: Secondary | ICD-10-CM | POA: Diagnosis present

## 2024-05-11 DIAGNOSIS — S43004A Unspecified dislocation of right shoulder joint, initial encounter: Secondary | ICD-10-CM

## 2024-05-11 DIAGNOSIS — I5032 Chronic diastolic (congestive) heart failure: Secondary | ICD-10-CM | POA: Diagnosis not present

## 2024-05-11 DIAGNOSIS — Z7984 Long term (current) use of oral hypoglycemic drugs: Secondary | ICD-10-CM | POA: Diagnosis not present

## 2024-05-11 DIAGNOSIS — J9811 Atelectasis: Secondary | ICD-10-CM | POA: Diagnosis not present

## 2024-05-11 DIAGNOSIS — Z8041 Family history of malignant neoplasm of ovary: Secondary | ICD-10-CM | POA: Diagnosis not present

## 2024-05-11 DIAGNOSIS — Z96642 Presence of left artificial hip joint: Secondary | ICD-10-CM | POA: Diagnosis present

## 2024-05-11 DIAGNOSIS — R531 Weakness: Secondary | ICD-10-CM | POA: Diagnosis not present

## 2024-05-11 DIAGNOSIS — R0689 Other abnormalities of breathing: Secondary | ICD-10-CM | POA: Diagnosis not present

## 2024-05-11 DIAGNOSIS — S42201D Unspecified fracture of upper end of right humerus, subsequent encounter for fracture with routine healing: Secondary | ICD-10-CM | POA: Diagnosis not present

## 2024-05-11 DIAGNOSIS — Z981 Arthrodesis status: Secondary | ICD-10-CM | POA: Diagnosis not present

## 2024-05-11 DIAGNOSIS — M25511 Pain in right shoulder: Secondary | ICD-10-CM | POA: Diagnosis not present

## 2024-05-11 DIAGNOSIS — N1831 Chronic kidney disease, stage 3a: Secondary | ICD-10-CM | POA: Diagnosis not present

## 2024-05-11 DIAGNOSIS — Z9889 Other specified postprocedural states: Secondary | ICD-10-CM | POA: Diagnosis not present

## 2024-05-11 HISTORY — PX: ORIF HUMERUS FRACTURE: SHX2126

## 2024-05-11 LAB — COMPREHENSIVE METABOLIC PANEL WITH GFR
ALT: 24 U/L (ref 0–44)
AST: 50 U/L — ABNORMAL HIGH (ref 15–41)
Albumin: 3.3 g/dL — ABNORMAL LOW (ref 3.5–5.0)
Alkaline Phosphatase: 57 U/L (ref 38–126)
Anion gap: 13 (ref 5–15)
BUN: 34 mg/dL — ABNORMAL HIGH (ref 8–23)
CO2: 27 mmol/L (ref 22–32)
Calcium: 8.3 mg/dL — ABNORMAL LOW (ref 8.9–10.3)
Chloride: 100 mmol/L (ref 98–111)
Creatinine, Ser: 1.55 mg/dL — ABNORMAL HIGH (ref 0.44–1.00)
GFR, Estimated: 35 mL/min — ABNORMAL LOW (ref >60)
Glucose, Bld: 167 mg/dL — ABNORMAL HIGH (ref 70–99)
Potassium: 4.6 mmol/L (ref 3.5–5.1)
Sodium: 140 mmol/L (ref 135–145)
Total Bilirubin: 0.9 mg/dL (ref 0.0–1.2)
Total Protein: 5.9 g/dL — ABNORMAL LOW (ref 6.5–8.1)

## 2024-05-11 LAB — CBC
HCT: 43.1 % (ref 36.0–46.0)
Hemoglobin: 13.6 g/dL (ref 12.0–15.0)
MCH: 32.1 pg (ref 26.0–34.0)
MCHC: 31.6 g/dL (ref 30.0–36.0)
MCV: 101.7 fL — ABNORMAL HIGH (ref 80.0–100.0)
Platelets: 264 K/uL (ref 150–400)
RBC: 4.24 MIL/uL (ref 3.87–5.11)
RDW: 13.3 % (ref 11.5–15.5)
WBC: 12.4 K/uL — ABNORMAL HIGH (ref 4.0–10.5)
nRBC: 0 % (ref 0.0–0.2)

## 2024-05-11 LAB — SURGICAL PCR SCREEN
MRSA, PCR: NEGATIVE
Staphylococcus aureus: NEGATIVE

## 2024-05-11 LAB — GLUCOSE, CAPILLARY
Glucose-Capillary: 126 mg/dL — ABNORMAL HIGH (ref 70–99)
Glucose-Capillary: 133 mg/dL — ABNORMAL HIGH (ref 70–99)
Glucose-Capillary: 139 mg/dL — ABNORMAL HIGH (ref 70–99)
Glucose-Capillary: 134 mg/dL — ABNORMAL HIGH (ref 70–99)
Glucose-Capillary: 118 mg/dL — ABNORMAL HIGH (ref 70–99)
Glucose-Capillary: 146 mg/dL — ABNORMAL HIGH (ref 70–99)

## 2024-05-11 LAB — PROTIME-INR
INR: 1.1 (ref 0.8–1.2)
Prothrombin Time: 14.8 s (ref 11.4–15.2)

## 2024-05-11 SURGERY — OPEN REDUCTION INTERNAL FIXATION (ORIF) PROXIMAL HUMERUS FRACTURE
Anesthesia: General | Site: Shoulder | Laterality: Right

## 2024-05-11 MED ORDER — METHOCARBAMOL 1000 MG/10ML IJ SOLN: 500.0000 mg | Freq: Four times a day (QID) | INTRAMUSCULAR | Status: DC | PRN

## 2024-05-11 MED ORDER — ACETAMINOPHEN 650 MG RE SUPP
650.0000 mg | Freq: Four times a day (QID) | RECTAL | Status: DC | PRN
Start: 2024-05-11 — End: 2024-05-11
  Filled 2024-05-11: qty 1

## 2024-05-11 MED ORDER — VANCOMYCIN HCL 1000 MG IV SOLR
INTRAVENOUS | Status: DC | PRN
Start: 2024-05-11 — End: 2024-05-11
  Administered 2024-05-11: 1000 mg via TOPICAL

## 2024-05-11 MED ORDER — ACETAMINOPHEN 325 MG PO TABS
650.0000 mg | ORAL_TABLET | Freq: Four times a day (QID) | ORAL | Status: DC
  Administered 2024-05-11 – 2024-05-16 (×19): 650 mg via ORAL
  Filled 2024-05-11 (×19): qty 2

## 2024-05-11 MED ORDER — HYDROMORPHONE HCL 1 MG/ML IJ SOLN
0.2500 mg | INTRAMUSCULAR | Status: DC | PRN
  Administered 2024-05-11: 0.5 mg via INTRAVENOUS
  Administered 2024-05-11 (×2): 0.25 mg via INTRAVENOUS

## 2024-05-11 MED ORDER — ALBUMIN HUMAN 5 % IV SOLN: INTRAVENOUS | Status: DC | PRN

## 2024-05-11 MED ORDER — DULOXETINE HCL 60 MG PO CPEP
60.0000 mg | ORAL_CAPSULE | Freq: Every day | ORAL | Status: DC
  Administered 2024-05-12 – 2024-05-16 (×5): 60 mg via ORAL
  Filled 2024-05-11 (×5): qty 1

## 2024-05-11 MED ORDER — MORPHINE SULFATE (PF) 2 MG/ML IV SOLN: 2.0000 mg | INTRAVENOUS | Status: DC | PRN

## 2024-05-11 MED ORDER — FENTANYL CITRATE (PF) 250 MCG/5ML IJ SOLN
INTRAMUSCULAR | Status: DC | PRN
  Administered 2024-05-11 (×3): 50 ug via INTRAVENOUS

## 2024-05-11 MED ORDER — ONDANSETRON HCL 4 MG/2ML IJ SOLN: 4.0000 mg | Freq: Four times a day (QID) | INTRAMUSCULAR | Status: DC | PRN

## 2024-05-11 MED ORDER — PHENYLEPHRINE 80 MCG/ML (10ML) SYRINGE FOR IV PUSH (FOR BLOOD PRESSURE SUPPORT)
PREFILLED_SYRINGE | INTRAVENOUS | Status: DC | PRN
  Administered 2024-05-11 (×2): 80 ug via INTRAVENOUS

## 2024-05-11 MED ORDER — METHOCARBAMOL 500 MG PO TABS
500.0000 mg | ORAL_TABLET | Freq: Four times a day (QID) | ORAL | Status: DC | PRN
  Administered 2024-05-12 – 2024-05-16 (×6): 500 mg via ORAL
  Filled 2024-05-11 (×6): qty 1

## 2024-05-11 MED ORDER — SODIUM CHLORIDE 0.9 % IV SOLN: INTRAVENOUS | Status: AC

## 2024-05-11 MED ORDER — METOPROLOL SUCCINATE ER 25 MG PO TB24
25.0000 mg | ORAL_TABLET | Freq: Every day | ORAL | Status: DC
  Administered 2024-05-12 – 2024-05-16 (×5): 25 mg via ORAL
  Filled 2024-05-11 (×6): qty 1

## 2024-05-11 MED ORDER — ENOXAPARIN SODIUM 40 MG/0.4ML IJ SOSY
40.0000 mg | PREFILLED_SYRINGE | INTRAMUSCULAR | Status: DC
  Administered 2024-05-11 – 2024-05-13 (×3): 40 mg via SUBCUTANEOUS
  Filled 2024-05-11 (×5): qty 0.4

## 2024-05-11 MED ORDER — PHENYLEPHRINE HCL-NACL 20-0.9 MG/250ML-% IV SOLN
INTRAVENOUS | Status: DC | PRN
  Administered 2024-05-11: 50 ug/min via INTRAVENOUS

## 2024-05-11 MED ORDER — FENTANYL CITRATE (PF) 100 MCG/2ML IJ SOLN
INTRAMUSCULAR | Status: AC
Start: 2024-05-11 — End: 2024-05-12
  Filled 2024-05-11: qty 2

## 2024-05-11 MED ORDER — ARIPIPRAZOLE 2 MG PO TABS
2.0000 mg | ORAL_TABLET | Freq: Every day | ORAL | Status: DC
  Administered 2024-05-12 – 2024-05-16 (×5): 2 mg via ORAL
  Filled 2024-05-11 (×6): qty 1

## 2024-05-11 MED ORDER — ROCURONIUM BROMIDE 10 MG/ML (PF) SYRINGE
PREFILLED_SYRINGE | INTRAVENOUS | Status: DC | PRN
  Administered 2024-05-11: 10 mg via INTRAVENOUS
  Administered 2024-05-11: 20 mg via INTRAVENOUS
  Administered 2024-05-11: 50 mg via INTRAVENOUS
  Administered 2024-05-11: 10 mg via INTRAVENOUS

## 2024-05-11 MED ORDER — POLYETHYLENE GLYCOL 3350 17 G PO PACK
17.0000 g | PACK | Freq: Every day | ORAL | Status: DC | PRN
  Administered 2024-05-14: 17 g via ORAL
  Filled 2024-05-11: qty 1

## 2024-05-11 MED ORDER — CHLORHEXIDINE GLUCONATE 0.12 % MT SOLN
15.0000 mL | Freq: Once | OROMUCOSAL | Status: AC
  Administered 2024-05-11: 15 mL via OROMUCOSAL

## 2024-05-11 MED ORDER — PROPOFOL 10 MG/ML IV BOLUS
INTRAVENOUS | Status: AC
  Filled 2024-05-11: qty 20

## 2024-05-11 MED ORDER — DEXAMETHASONE SODIUM PHOSPHATE 10 MG/ML IJ SOLN
INTRAMUSCULAR | Status: DC | PRN
  Administered 2024-05-11: 10 mg via INTRAVENOUS

## 2024-05-11 MED ORDER — OXYCODONE HCL 5 MG PO TABS
2.5000 mg | ORAL_TABLET | ORAL | Status: DC | PRN
  Administered 2024-05-11 – 2024-05-12 (×3): 2.5 mg via ORAL
  Filled 2024-05-11 (×3): qty 1

## 2024-05-11 MED ORDER — METOCLOPRAMIDE HCL 5 MG/ML IJ SOLN: 5.0000 mg | Freq: Three times a day (TID) | INTRAMUSCULAR | Status: DC | PRN

## 2024-05-11 MED ORDER — ATORVASTATIN CALCIUM 80 MG PO TABS
80.0000 mg | ORAL_TABLET | Freq: Every day | ORAL | Status: DC
  Administered 2024-05-11 – 2024-05-15 (×5): 80 mg via ORAL
  Filled 2024-05-11 (×5): qty 1

## 2024-05-11 MED ORDER — LACTATED RINGERS IV SOLN: INTRAVENOUS | Status: DC

## 2024-05-11 MED ORDER — 0.9 % SODIUM CHLORIDE (POUR BTL) OPTIME
TOPICAL | Status: DC | PRN
Start: 2024-05-11 — End: 2024-05-11
  Administered 2024-05-11: 1000 mL

## 2024-05-11 MED ORDER — VASOPRESSIN 20 UNIT/ML IV SOLN
INTRAVENOUS | Status: AC
  Filled 2024-05-11: qty 1

## 2024-05-11 MED ORDER — ONDANSETRON HCL 4 MG PO TABS: 4.0000 mg | ORAL_TABLET | Freq: Four times a day (QID) | ORAL | Status: DC | PRN

## 2024-05-11 MED ORDER — FENTANYL CITRATE (PF) 250 MCG/5ML IJ SOLN
INTRAMUSCULAR | Status: AC
  Filled 2024-05-11: qty 5

## 2024-05-11 MED ORDER — SUGAMMADEX SODIUM 200 MG/2ML IV SOLN
INTRAVENOUS | Status: DC | PRN
  Administered 2024-05-11: 200 mg via INTRAVENOUS

## 2024-05-11 MED ORDER — DROPERIDOL 2.5 MG/ML IJ SOLN: 0.6250 mg | Freq: Once | INTRAMUSCULAR | Status: DC | PRN

## 2024-05-11 MED ORDER — LIDOCAINE 2% (20 MG/ML) 5 ML SYRINGE
INTRAMUSCULAR | Status: DC | PRN
  Administered 2024-05-11: 100 mg via INTRAVENOUS

## 2024-05-11 MED ORDER — CEFAZOLIN SODIUM-DEXTROSE 1-4 GM/50ML-% IV SOLN
INTRAVENOUS | Status: DC | PRN
  Administered 2024-05-11: 2 g via INTRAVENOUS

## 2024-05-11 MED ORDER — PHENYLEPHRINE 80 MCG/ML (10ML) SYRINGE FOR IV PUSH (FOR BLOOD PRESSURE SUPPORT)
PREFILLED_SYRINGE | INTRAVENOUS | Status: AC
  Filled 2024-05-11: qty 10

## 2024-05-11 MED ORDER — CEFAZOLIN SODIUM-DEXTROSE 2-4 GM/100ML-% IV SOLN
2.0000 g | Freq: Three times a day (TID) | INTRAVENOUS | Status: AC
  Administered 2024-05-11 – 2024-05-12 (×3): 2 g via INTRAVENOUS
  Filled 2024-05-11 (×3): qty 100

## 2024-05-11 MED ORDER — SODIUM CHLORIDE 0.9% FLUSH
3.0000 mL | Freq: Two times a day (BID) | INTRAVENOUS | Status: DC
  Administered 2024-05-11 – 2024-05-16 (×9): 3 mL via INTRAVENOUS

## 2024-05-11 MED ORDER — VANCOMYCIN HCL 1000 MG IV SOLR
INTRAVENOUS | Status: AC
  Filled 2024-05-11: qty 20

## 2024-05-11 MED ORDER — MORPHINE SULFATE (PF) 2 MG/ML IV SOLN: 0.5000 mg | INTRAVENOUS | Status: DC | PRN

## 2024-05-11 MED ORDER — ONDANSETRON HCL 4 MG/2ML IJ SOLN
INTRAMUSCULAR | Status: AC
  Filled 2024-05-11: qty 2

## 2024-05-11 MED ORDER — DONEPEZIL HCL 10 MG PO TABS
10.0000 mg | ORAL_TABLET | Freq: Every morning | ORAL | Status: DC
  Administered 2024-05-12 – 2024-05-16 (×5): 10 mg via ORAL
  Filled 2024-05-11 (×5): qty 1

## 2024-05-11 MED ORDER — PROPOFOL 10 MG/ML IV BOLUS
INTRAVENOUS | Status: DC | PRN
  Administered 2024-05-11: 100 mg via INTRAVENOUS

## 2024-05-11 MED ORDER — DIPHENHYDRAMINE HCL 12.5 MG/5ML PO ELIX: 12.5000 mg | ORAL_SOLUTION | ORAL | Status: DC | PRN

## 2024-05-11 MED ORDER — DOCUSATE SODIUM 100 MG PO CAPS
100.0000 mg | ORAL_CAPSULE | Freq: Two times a day (BID) | ORAL | Status: DC
  Administered 2024-05-11 – 2024-05-16 (×9): 100 mg via ORAL
  Filled 2024-05-11 (×10): qty 1

## 2024-05-11 MED ORDER — PHENYLEPHRINE HCL-NACL 20-0.9 MG/250ML-% IV SOLN: INTRAVENOUS | Status: DC | PRN

## 2024-05-11 MED ORDER — HYDROCODONE-ACETAMINOPHEN 5-325 MG PO TABS: 1.0000 | ORAL_TABLET | Freq: Four times a day (QID) | ORAL | Status: DC | PRN

## 2024-05-11 MED ORDER — STERILE WATER FOR IRRIGATION IR SOLN
Status: DC | PRN
  Administered 2024-05-11: 1000 mL

## 2024-05-11 MED ORDER — METOCLOPRAMIDE HCL 5 MG PO TABS: 5.0000 mg | ORAL_TABLET | Freq: Three times a day (TID) | ORAL | Status: DC | PRN

## 2024-05-11 MED ORDER — ORAL CARE MOUTH RINSE: 15.0000 mL | Freq: Once | OROMUCOSAL | Status: AC

## 2024-05-11 MED ORDER — FENTANYL CITRATE (PF) 100 MCG/2ML IJ SOLN
50.0000 ug | Freq: Once | INTRAMUSCULAR | Status: AC
  Administered 2024-05-11: 50 ug via INTRAVENOUS

## 2024-05-11 MED ORDER — ONDANSETRON HCL 4 MG/2ML IJ SOLN
INTRAMUSCULAR | Status: DC | PRN
  Administered 2024-05-11: 4 mg via INTRAVENOUS

## 2024-05-11 MED ORDER — ALBUTEROL SULFATE (2.5 MG/3ML) 0.083% IN NEBU: 2.5000 mg | INHALATION_SOLUTION | Freq: Four times a day (QID) | RESPIRATORY_TRACT | Status: DC | PRN

## 2024-05-11 MED ORDER — GABAPENTIN 300 MG PO CAPS
600.0000 mg | ORAL_CAPSULE | Freq: Two times a day (BID) | ORAL | Status: DC
  Administered 2024-05-11 – 2024-05-16 (×9): 600 mg via ORAL
  Filled 2024-05-11 (×10): qty 2

## 2024-05-11 MED ORDER — EPHEDRINE SULFATE-NACL 50-0.9 MG/10ML-% IV SOSY
PREFILLED_SYRINGE | INTRAVENOUS | Status: DC | PRN
  Administered 2024-05-11: 10 mg via INTRAVENOUS

## 2024-05-11 MED ORDER — HYDROMORPHONE HCL 1 MG/ML IJ SOLN
INTRAMUSCULAR | Status: AC
  Filled 2024-05-11: qty 1

## 2024-05-11 MED ORDER — ACETAMINOPHEN 325 MG PO TABS
650.0000 mg | ORAL_TABLET | Freq: Four times a day (QID) | ORAL | Status: DC | PRN
Start: 2024-05-11 — End: 2024-05-11

## 2024-05-11 MED ORDER — DEXAMETHASONE SODIUM PHOSPHATE 10 MG/ML IJ SOLN
INTRAMUSCULAR | Status: AC
  Filled 2024-05-11: qty 1

## 2024-05-11 SURGICAL SUPPLY — 49 items
BAG COUNTER SPONGE SURGICOUNT (BAG) ×1 IMPLANT
BIT DRILL CALIBRATED 2.7 (BIT) IMPLANT
BNDG ELASTIC 6INX 5YD STR LF (GAUZE/BANDAGES/DRESSINGS) ×1 IMPLANT
BRUSH SCRUB EZ PLAIN DRY (MISCELLANEOUS) ×2 IMPLANT
CHLORAPREP W/TINT 26 (MISCELLANEOUS) ×1 IMPLANT
COVER SURGICAL LIGHT HANDLE (MISCELLANEOUS) ×2 IMPLANT
DRAPE C-ARM 42X72 X-RAY (DRAPES) ×1 IMPLANT
DRAPE HALF SHEET 40X57 (DRAPES) ×1 IMPLANT
DRAPE SURG 17X23 STRL (DRAPES) ×2 IMPLANT
DRAPE U-SHAPE 47X51 STRL (DRAPES) ×2 IMPLANT
DRSG MEPILEX POST OP 4X8 (GAUZE/BANDAGES/DRESSINGS) ×1 IMPLANT
ELECTRODE REM PT RTRN 9FT ADLT (ELECTROSURGICAL) ×1 IMPLANT
GLOVE BIO SURGEON STRL SZ 6.5 (GLOVE) ×3 IMPLANT
GLOVE BIO SURGEON STRL SZ7.5 (GLOVE) ×3 IMPLANT
GLOVE BIOGEL PI IND STRL 6.5 (GLOVE) ×1 IMPLANT
GLOVE BIOGEL PI IND STRL 7.5 (GLOVE) ×1 IMPLANT
GOWN STRL REUS W/ TWL LRG LVL3 (GOWN DISPOSABLE) ×2 IMPLANT
KIT BASIN OR (CUSTOM PROCEDURE TRAY) ×1 IMPLANT
KIT TURNOVER KIT B (KITS) ×1 IMPLANT
KWIRE 2X5 SS THRDED S3 (WIRE) IMPLANT
MANIFOLD NEPTUNE II (INSTRUMENTS) ×1 IMPLANT
NS IRRIG 1000ML POUR BTL (IV SOLUTION) ×1 IMPLANT
PACK SHOULDER (CUSTOM PROCEDURE TRAY) ×1 IMPLANT
PAD ARMBOARD POSITIONER FOAM (MISCELLANEOUS) ×2 IMPLANT
PLATE PROX HUM LO R 4H 83 (Plate) IMPLANT
SCREW LOCK CORT STAR 3.5X34 (Screw) IMPLANT
SCREW LOCK CORT STAR 3.5X36 (Screw) IMPLANT
SCREW LOCK CORT STAR 3.5X38 (Screw) IMPLANT
SCREW LOCK CORT STAR 3.5X40 (Screw) IMPLANT
SCREW LOCK CORT STAR 3.5X48 (Screw) IMPLANT
SCREW LOW PROF TIS 3.5X28MM (Screw) IMPLANT
SCREW LOW PROFILE 3.5X30MM TIS (Screw) IMPLANT
SCREW LP NL T15 3.5X26 (Screw) IMPLANT
SCREW T15 MD 3.5X52MM NS (Screw) IMPLANT
SLING ARM FOAM STRAP XLG (SOFTGOODS) IMPLANT
SPONGE T-LAP 18X18 ~~LOC~~+RFID (SPONGE) ×1 IMPLANT
STAPLER SKIN PROX 35W (STAPLE) ×1 IMPLANT
SUCTION TUBE FRAZIER 10FR DISP (SUCTIONS) ×1 IMPLANT
SUT ETHILON 3 0 FSL (SUTURE) IMPLANT
SUT ETHILON 3 0 PS 1 (SUTURE) IMPLANT
SUT MNCRL AB 3-0 PS2 18 (SUTURE) ×1 IMPLANT
SUT MON AB 2-0 CT1 36 (SUTURE) IMPLANT
SUT VIC AB 0 CT1 27XBRD ANBCTR (SUTURE) ×2 IMPLANT
SUT VIC AB 2-0 CT1 TAPERPNT 27 (SUTURE) ×2 IMPLANT
SUTURE FIBERWR #2 38 T-5 BLUE (SUTURE) ×2 IMPLANT
SUTURE FIBERWR 2-0 18 17.9 3/8 (SUTURE) IMPLANT
TOWEL GREEN STERILE (TOWEL DISPOSABLE) ×2 IMPLANT
TRAY FOLEY MTR SLVR 16FR STAT (SET/KITS/TRAYS/PACK) IMPLANT
WATER STERILE IRR 1000ML POUR (IV SOLUTION) ×1 IMPLANT

## 2024-05-11 NOTE — Care Plan (Signed)
 Report given to Ginger RN, patient picked up by [facility] to short stay with dr. Mable

## 2024-05-11 NOTE — Progress Notes (Signed)
 Called Logan with [employer] regarding pacer interrogation requested by anesthesia prior to surgery. Logan stated he would review patient's chart and call back afterwards.

## 2024-05-11 NOTE — Progress Notes (Signed)
 Orthopedic Tech Progress Note Patient Details:  [name] 05-14-1946 [id number]  Patient ID: [name] [name], female   DOB: 11-06-1945, 78 y.o.   MRN: [id number] [name] [name] 05/11/2024, 8:53 PM No OHF. Age restricted.

## 2024-05-11 NOTE — Anesthesia Preprocedure Evaluation (Addendum)
 Anesthesia Evaluation  Patient identified by MRN, date of birth, ID band Patient awake    Reviewed: Allergy & Precautions, H&P , NPO status , Patient's Chart, lab work & pertinent test results  Airway Mallampati: III  TM Distance: >3 FB Neck ROM: Limited    Dental  (+) Missing, Poor Dentition   Pulmonary asthma     + decreased breath sounds      Cardiovascular Exercise Tolerance: Poor hypertension, +CHF (Chronic diastolic CHF)  Normal cardiovascular exam+ dysrhythmias (paroxysmal atrial fibrillation (not on [facility])) + pacemaker  Rhythm:Regular  Pacer interrogation (05/03/2024) Longevity >8 yrs, AM-VS 2.3%, VS only 21.%%, AM-VP 59%, VP only 17.1% (23.9% sensed, 76.1% paced)   complete heart block s/p Micra AV leadless PPM (12/06/2021)  EKG with sinus tachycardia with LBBB/ventricular paced, rate 100s  (similar to prior EKGs) and per sinus rhythm, rate 70s with paroxysmal A-fib.  ECHO 06/11/2020  1. Left ventricular ejection fraction, by estimation, is 60 to 65%. Left ventricular ejection fraction by PLAX is 64 %. The left ventricle has normal function. The left ventricle has no regional wall motion abnormalities. There is moderate left ventricular hypertrophy. Left ventricular diastolic parameters are consistent with Grade I diastolic dysfunction (impaired relaxation).   2. Right ventricular systolic function was not well visualized. The right ventricular size is normal.   3. Left atrial size was mildly dilated.   4. The mitral valve is grossly normal. Trivial mitral valve regurgitation.   5. The aortic valve was not well visualized. There is mild calcification of the aortic valve. Aortic valve regurgitation is not visualized.     Neuro/Psych  PSYCHIATRIC DISORDERS  Depression    mild cognitive impairmentFailed back surgical syndrome   walking with her walker  Neuromuscular disease (PN) CVA    GI/Hepatic negative GI ROS,,,(+)  Hepatitis -  Endo/Other  diabetes, Type 2  Class 3 obesity  Renal/GU Renal InsufficiencyRenal disease     Musculoskeletal  (+) Arthritis ,    Abdominal  (+) + obese  Peds  Hematology negative hematology ROS (+)   Anesthesia Other Findings   Reproductive/Obstetrics                              Anesthesia Physical Anesthesia Plan  ASA: 3  Anesthesia Plan: General   Post-op Pain Management: Ofirmev  IV (intra-op)*   Induction: Intravenous  PONV Risk Score and Plan: 3 and Ondansetron , Dexamethasone  and Treatment may vary due to age or medical condition  Airway Management Planned: Oral ETT and Video Laryngoscope Planned  Additional Equipment:   Intra-op Plan:   Post-operative Plan: Extubation in OR  Informed Consent: I have reviewed the patients History and Physical, chart, labs and discussed the procedure including the risks, benefits and alternatives for the proposed anesthesia with the patient or authorized representative who has indicated his/her understanding and acceptance.     Dental advisory given  Plan Discussed with: CRNA  Anesthesia Plan Comments: (Dr. Kendal requests no block)         Anesthesia Quick Evaluation

## 2024-05-11 NOTE — Plan of Care (Signed)
 Patient arrives as a transfer from [facility].  Noted to have initial soft blood pressures noted to be as low as 97/54.  Patient thought to be euvolemic on physical exam with normal mentation.  Recommending adjusting blood pressure cuff and rechecking blood pressures.  Patient's cardiac risk appears similar to prior day.

## 2024-05-11 NOTE — Progress Notes (Signed)
 [name] with [employer] returned called stating he was not in the hospital at this time but would fax over the recent interrogation from a couple of days ago. Will make anesthesia aware.

## 2024-05-11 NOTE — Consult Note (Signed)
 Orthopaedic Trauma Service (OTS) Consult   Patient ID: [name] MRN: [id number] DOB/AGE: [birth year] 78 y.o.  Reason for Consult:Right proximal humerus fracture/dislocation Referring Physician: Dr. Edie, MD [facility]  HPI: [name] is an 78 y.o. female who is being seen in consultation at the request of Dr. Edie for evaluation of left proximal humerus fracture dislocation.  The patient had a ground-level fall with a left greater tuberosity fracture dislocation of his humerus.  She underwent attempted closed reduction in the operating room with Dr. Edie.  Attempted reduction was obtained however there was a fracture occurred with the humeral head still dislocated.  This consulted to assist with management due to the complexity of her injury.  She was transferred over here this morning to the preop area.  There is concerned about her blood pressure being a little bit low.  Hospitalist was admitted the patient at [facility] yesterday but unfortunately no one has seen her yet today.  She has also seen by cardiology who gave her clearance for the operating room yesterday.  Patient is right-hand dominant.  She is currently having pain but denies any numbness or tingling to the upper extremity.  She lives at home with her son.  She is ambulatory with a walker.  Past Medical History:  Diagnosis Date   Arthritis    Confusion    Diabetes mellitus without complication (HCC)    type 2   Hepatitis    pt. denies   Hypertension    Lumbar stenosis    Memory loss    Nocturia    Numbness and tingling    legs and feet   Wears glasses     Past Surgical History:  Procedure Laterality Date   BACK SURGERY     CESAREAN SECTION  1978   COLONOSCOPY     HEMORRHOID SURGERY  2015   JOINT REPLACEMENT     01-08-18 Dr. KYM Poli   PACEMAKER LEADLESS INSERTION N/A 12/06/2021   Procedure: PACEMAKER LEADLESS INSERTION;  Surgeon: Ammon Blunt, MD;  Location: [facility] INVASIVE CV LAB;  Service:  Cardiovascular;  Laterality: N/A;   TOTAL HIP ARTHROPLASTY Left 01/08/2018   Procedure: LEFT TOTAL HIP ARTHROPLASTY ANTERIOR APPROACH;  Surgeon: Poli Lonni GRADE, MD;  Location: [facility];  Service: Orthopedics;  Laterality: Left;   TRANSFORAMINAL LUMBAR INTERBODY FUSION (TLIF) WITH PEDICLE SCREW FIXATION 2 LEVEL N/A 11/07/2015   Procedure: Lumbar three-four Lumbar four-five  transforaminal lumbar interbody fusion with interbody prosthesis posterior lateral arthrodesis and posterior segmental instrumentation;  Surgeon: Reyes Budge, MD;  Location: [facility];  Service: Neurosurgery;  Laterality: N/A;    Family History  Problem Relation Age of Onset   Hypertension Other    Cancer Other        ovarian   Cancer Mother    Heart disease Father     Social History:  reports that she has never smoked. She has never been exposed to tobacco smoke. She has never used smokeless tobacco. She reports that she does not drink alcohol and does not use drugs.  Allergies: No Known Allergies  Medications: I have reviewed the patient's current medications.  ROS: Constitutional: No fever or chills Vision: No changes in vision ENT: No difficulty swallowing CV: No chest pain Pulm: No SOB or wheezing GI: No nausea or vomiting GU: No urgency or inability to hold urine Skin: No poor wound healing Neurologic: No numbness or tingling Psychiatric: No depression or anxiety Heme: No bruising Allergic: No reaction to medications  or food   Exam: Blood pressure (!) 97/54, pulse 83, temperature 98.1 F (36.7 C), temperature source Oral, resp. rate 18, SpO2 94%. General: No acute distress Orientation: Awake alert x 3 Mood and Affect: Pleasant Gait: Unable to assess due to her injury Coordination and balance: Within normal limits  Right upper extremity: Patient is in the sling and swath.  Moderate swelling to the left proximal arm.  No significant ecchymosis there but she does have significant  bruising to the elbow and swelling to the elbow and hand.  She has intact motor and sensory function to median, radial and ulnar nerve distribution.  She has intact sensation to the axillary nerve distribution.  She did not tolerate much attempted to abduct her shoulder with the deltoid to test the axillary nerve.  She got as a good palpable radial pulse with brisk cap refill and warm hand.  Left upper extremity: Skin without lesions. No tenderness to palpation. Full painless ROM, full strength in each muscle groups without evidence of instability.   Medical Decision Making: Data: Imaging: X-rays and CT scan have been reviewed which shows a right proximal humerus fracture dislocation.  Previously it was the greater tuberosity but unfortunately she has a metaphyseal fracture from the attempted closed reduction.  Labs:  Results for orders placed or performed during the hospital encounter of 05/11/24 (from the past 24 hours)  Glucose, capillary     Status: Abnormal   Collection Time: 05/11/24  9:14 AM  Result Value Ref Range   Glucose-Capillary 133 (H) 70 - 99 mg/dL  Glucose, capillary     Status: Abnormal   Collection Time: 05/11/24 11:06 AM  Result Value Ref Range   Glucose-Capillary 139 (H) 70 - 99 mg/dL   *Note: Due to a large number of results and/or encounters for the requested time period, some results have not been displayed. A complete set of results can be found in Results Review.     Imaging or Labs ordered: None  Medical history and chart was reviewed and case discussed with medical provider.  Assessment/Plan: 78 year old female with a right proximal humerus fracture dislocation status post closed reduction attempt that is failed.  Patient unfortunately continues to be dislocated.  We will plan to proceed today for open reduction internal fixation.  Risks and benefits were discussed with the patient.  Risk include but not limited to bleeding, infection, malunion, nonunion,  hardware failure, hardware irritation, nerve and blood vessel injury, posttraumatic arthritis, avascular necrosis, need for revision surgery, T, and the possibility anesthetic complications.  She agrees to proceed with surgery and consent was obtained.  I have talked with Dr. Darlyn (anesthesia) regarding her care.  He is concerned about her cardiac history with due to the hypotension as well as the limited workup for her heart since 2021 that is in the EPIC system.  I will defer to him regarding cardiology clearance here at [facility] as well as what further workup needs to be done.  I have moved her to the last case today.  I do feel her fracture and dislocation is a surgical urgency and I would like to proceed today safe and appropriate.   Franky MYRTIS Light, MD [facility] [phone] (office) [email or web address]

## 2024-05-11 NOTE — Discharge Summary (Signed)
 Physician Discharge Summary   Patient: [name] MRN: [id number] DOB: [birth year]  Admit date:     05/10/2024  Discharge date: 05/11/24  Discharge Physician: Amaryllis Dare   PCP: Jimmy Charlie FERNS, MD   Recommendations at discharge:  Patient is being discharged to [facility] for further management  Discharge Diagnoses: Principal Problem:   Anterior dislocation of right shoulder Active Problems:   Complete heart block (HCC)   Essential hypertension, benign   Type 2 diabetes, controlled, with neuropathy (HCC)   Morbid obesity (HCC)   MDD (major depressive disorder), recurrent, in partial remission (HCC)   Chronic pain syndrome   HLD (hyperlipidemia)   Paroxysmal atrial fibrillation (HCC)   Fall   Acute pain of right shoulder   Closed fracture of right proximal humerus   Hospital Course: [name] is a pleasant 78 y.o. female with medical history significant for complete heart block s/p Micra AV 12/06/2021, HTN, HLD, HFpEF, type II DM, dementia, CKD who presented to the ED after a fall at home.  Patient has multiple medication that can cause unstable including gabapentin .  Patient uses walker normally but she was not using this morning.  Patient was ambulating and felt dizzy and fell on the floor on her right shoulder.   Upon arrival to the ED, patient is found to be anterior dislocation of right shoulder.  ED they tried to reduce the shoulder with propofol , Dilaudid , Zofran , morphine , diazepam .  It was not successful.  Orthopedic was consulted and she was taken to the OR but unfortunately that attempt was also unsuccessful. During one of these maneuvers, a crack was felt and heard.  A proximal humeral shaft fracture was identified C arm imaging.  After considering the options, it was felt best to place the patient in a shoulder immobilizer.  Patient need open reduction and internal fixation and due to unavailability of proper equipment orthopedic surgery requested transfer to  [facility] for further management.  Patient clinically appears euvolemic with history of HFpEF.  She also has a pacemaker in place and on metoprolol , not on any anticoagulation with history of A-fib.  Patient is being transferred to [facility] for further surgical intervention and she will continue with her home medications.  We will defer rest of the decision to [facility] team for further management.    Consultants: Orthopedic surgery Procedures performed: Failed attempts for closed reduction of right shoulder Disposition: [place] Diet recommendation:  Discharge Diet Orders (From admission, onward)     Start     Ordered   05/11/24 0000  Diet - low sodium heart healthy        05/11/24 0810           Cardiac and Carb modified diet DISCHARGE MEDICATION: Allergies as of 05/11/2024   No Known Allergies      Medication List     PAUSE taking these medications    aspirin  EC 81 MG tablet Wait to take this until your doctor or other care provider tells you to start again. Take 81 mg by mouth 3 (three) times a week.       STOP taking these medications    amLODipine  5 MG tablet Commonly known as: NORVASC        TAKE these medications    Accu-Chek SmartView test strip Generic drug: glucose blood Use to check blood sugar once daily   ARIPiprazole  2 MG tablet Commonly known as: ABILIFY  Take 2 mg by mouth daily.   atorvastatin   80 MG tablet Commonly known as: LIPITOR  TAKE 1 TABLET BY MOUTH ONCE  DAILY   cholecalciferol  25 MCG (1000 UNIT) tablet Commonly known as: VITAMIN D3 Take 1,000 Units by mouth daily.   donepezil  10 MG tablet Commonly known as: ARICEPT  TAKE 1 TABLET BY MOUTH EVERY  MORNING   DULoxetine  60 MG capsule Commonly known as: CYMBALTA  TAKE 1 CAPSULE BY MOUTH DAILY   gabapentin  300 MG capsule Commonly known as: NEURONTIN  Take 2 capsules (600 mg total) by mouth 2 (two) times daily. And 900 mg at bedtime   lisinopril -hydrochlorothiazide   20-12.5 MG tablet Commonly known as: ZESTORETIC  TAKE 2 TABLETS BY MOUTH DAILY   metFORMIN  500 MG 24 hr tablet Commonly known as: GLUCOPHAGE -XR Take 1 tablet (500 mg total) by mouth daily with breakfast.   metoprolol  succinate 25 MG 24 hr tablet Commonly known as: TOPROL -XL Take 1 tablet (25 mg total) by mouth daily.   multivitamin with minerals tablet Take 1 tablet by mouth daily.   traMADol  50 MG tablet Commonly known as: ULTRAM  TAKE 1 TO 2 TABLETS BY MOUTH  EVERY 8 HOURS AS NEEDED        Follow-up Information     Paraschos, Alexander, MD. Go in 1 week(s).   Specialty: Cardiology Contact information: [address] [facility] [place] [address] [phone]                Discharge Exam: [name]   05/10/24 0932  Weight: 97.5 kg   General.  Morbidly obese elderly lady, in no acute distress. Pulmonary.  Lungs clear bilaterally, normal respiratory effort. CV.  Regular rate and rhythm, no JVD, rub or murmur. Abdomen.  Soft, nontender, nondistended, BS positive. CNS.  Alert and oriented .  No focal neurologic deficit. Extremities.  No edema, , pulses intact and symmetrical.  Right shoulder in sling  Condition at discharge: stable  The results of significant diagnostics from this hospitalization (including imaging, microbiology, ancillary and laboratory) are listed below for reference.   Imaging Studies: DG Shoulder Right Result Date: 05/10/2024 CLINICAL DATA:  Elective surgery. EXAM: RIGHT SHOULDER - 2+ VIEW COMPARISON:  Radiograph earlier today FINDINGS: Single fluoroscopic spot view of the shoulder submitted from the operating room. The humeral head is dislocated with respect to the glenoid. Displaced proximal humeral fracture. Fluoroscopy time 36 seconds. Dose 5.4 mGy. IMPRESSION: Fluoroscopic spot view of the shoulder from the operating room. Electronically Signed   By: Andrea Gasman M.D.   On: 05/10/2024 19:25   DG  C-Arm 1-60 Min-No Report Result Date: 05/10/2024 Fluoroscopy was utilized by the requesting physician.  No radiographic interpretation.   CT Shoulder Right Wo Contrast Result Date: 05/10/2024 CLINICAL DATA:  Anterior glenohumeral dislocation. EXAM: CT OF THE UPPER RIGHT EXTREMITY WITHOUT CONTRAST TECHNIQUE: Multidetector CT imaging of the upper right extremity was performed according to the standard protocol. RADIATION DOSE REDUCTION: This exam was performed according to the departmental dose-optimization program which includes automated exposure control, adjustment of the mA and/or kV according to patient size and/or use of iterative reconstruction technique. COMPARISON:  Same day radiographs of the right shoulder dated 05/10/2024. FINDINGS: Bones/Joint/Cartilage Anterior, inferior, and medial dislocation of the right humeral head with respect to the glenoid. Associated large Hill-Sachs fracture at the posterolateral humeral head with comminuted greater tuberosity fracture demonstrating approximately 12 mm of lateral and 15-20 mm of posterior displacement. The posterior aspect of the anteriorly dislocated humeral head is perched on the anterior scapular neck, just below and medial  to the level of the anterior inferior glenoid. The glenoid otherwise appears intact. Glenohumeral joint effusion with moderate-to-large volume lipohemarthrosis. The acromioclavicular joint appears anatomically aligned with mild degenerative arthropathy. Ligaments Ligaments are suboptimally evaluated by CT. Muscles and Tendons Soft tissue and intramuscular edema of the right rotator cuff musculature. Soft tissue Soft tissue edema surrounding the right shoulder and chest wall. IMPRESSION: 1. Anterior, inferior, and medial dislocation of the right humeral head with respect to the glenoid. Associated large Hill-Sachs fracture of the posterolateral humeral head with displaced comminuted right greater tuberosity fragment. The posterior aspect  of the anteriorly and inferiorly dislocated humeral head is perched on the anterior scapular neck, just below and medial to the level of the anterior inferior glenoid. The glenoid otherwise appears intact. 2. Moderate to large volume glenohumeral and subacromial/subdeltoid lipohemarthrosis with associated soft tissue and intramuscular edema of the right rotator cuff musculature. Electronically Signed   By: Harrietta Sherry M.D.   On: 05/10/2024 16:00   DG Chest 1 View Result Date: 05/10/2024 CLINICAL DATA:  Shoulder dislocation, hypoxia EXAM: CHEST  1 VIEW COMPARISON:  08/29/2022 FINDINGS: The right humeral head is dislocated anteriorly and inferiorly. Large greater tuberosity fragment observed lateral to the lower glenoid. Low lung volumes are present, causing crowding of the pulmonary vasculature. Leadless pacer noted. Mild atelectasis at the lung bases. IMPRESSION: 1. Anterior inferior dislocation of the right humeral head. 2. Large right greater tuberosity fragment lateral to the lower glenoid. 3. Low lung volumes with mild atelectasis at the lung bases. Electronically Signed   By: Ryan Salvage M.D.   On: 05/10/2024 13:39   DG Shoulder Right Portable Result Date: 05/10/2024 EXAM: 1 VIEW XRAY OF THE RIGHT SHOULDER 05/10/2024 12:51:00 PM COMPARISON: Earlier today at 12:22 pm CLINICAL HISTORY: Shoulder reduction. Shoulder reduction #2; New hypoxia. FINDINGS: BONES AND JOINTS: Displaced greater tuberosity fracture fragment with Hill-Sachs lesion. Anterior dislocation of humeral head with respect to glenoid. SOFT TISSUES: No abnormal calcifications. Visualized lung is unremarkable. IMPRESSION: 1. Anterior dislocation of the right humeral head with respect to the glenoid. 2. Displaced greater tuberosity fracture fragment with Hill-Sachs lesion. 3. No significant change since earlier today. Electronically signed by: Rockey Kilts MD 05/10/2024 01:36 PM EDT RP Workstation: [id number]   DG Shoulder Right  Portable Result Date: 05/10/2024 CLINICAL DATA:  Post reduction first attempt EXAM: RIGHT SHOULDER - 1 VIEW COMPARISON:  None Available. FINDINGS: Anterior dislocation of the shoulder. Avulsion of the greater tuberosity. IMPRESSION: Persistent anterior shoulder dislocation with humeral head avulsion. Electronically Signed   By: Jackquline Boxer M.D.   On: 05/10/2024 13:31   DG Humerus Right Result Date: 05/10/2024 CLINICAL DATA:  Shoulder pain, fall EXAM: RIGHT HUMERUS - 2+ VIEW COMPARISON:  None Available. FINDINGS: Anterior subcoracoid humeral dislocation at the glenohumeral joint with adjacent osseous fragment likely representing displaced humeral head fracture fragment. IMPRESSION: Anterior humeral dislocation at the glenohumeral joint and likely humeral head fracture. Electronically Signed   By: Michaeline Blanch M.D.   On: 05/10/2024 10:50   CT Head Wo Contrast Result Date: 05/10/2024 CLINICAL DATA:  Provided history: Fall from standing.  Dizziness. EXAM: CT HEAD WITHOUT CONTRAST CT CERVICAL SPINE WITHOUT CONTRAST TECHNIQUE: Multidetector CT imaging of the head and cervical spine was performed following the standard protocol without intravenous contrast. Multiplanar CT image reconstructions of the cervical spine were also generated. RADIATION DOSE REDUCTION: This exam was performed according to the departmental dose-optimization program which includes automated exposure control, adjustment of the mA and/or kV  according to patient size and/or use of iterative reconstruction technique. COMPARISON:  Brain MRI 01/05/2023. FINDINGS: CT HEAD FINDINGS Brain: Generalized cerebral atrophy. Moderate patchy and ill-defined hypoattenuation within the cerebral white matter and within/bout the basal ganglia, nonspecific but compatible with chronic small vessel ischemic disease. Known 11 mm pituitary cyst, as was demonstrated on the prior brain MRI of 01/05/2023. No appreciable mass effect upon, or invasion of, regional  structures. There is no acute intracranial hemorrhage. No demarcated cortical infarct. No extra-axial fluid collection. No evidence of an intracranial mass. No midline shift. Vascular: No hyperdense vessel.  Atherosclerotic calcifications. Skull: No calvarial fracture or aggressive osseous lesion. Sinuses/Orbits: No mass or acute finding within the imaged orbits. Trace mucosal thickening within the right maxillary sinus. Severe right anterior ethmoid and right frontal sinusitis. CT CERVICAL SPINE FINDINGS Alignment: Non severe bursal the expected cervical lordosis. 3 mm C2-C3 grade 1 anterolisthesis. 4 mm C3-C4 grade 1 retrolisthesis. 3 mm C4-C5 grade 1 anterolisthesis. 3 mm C7-T1 grade 1 anterolisthesis. Skull base and vertebrae: The basion-dental and atlanto-dental intervals are maintained.No evidence of acute fracture to the cervical spine. Soft tissues and spinal canal: No prevertebral fluid or swelling. No visible canal hematoma. Disc levels: Cervical spondylosis with multilevel disc space narrowing, disc bulges, posterior disc osteophyte complexes, endplate spurring, uncovertebral hypertrophy and facet arthropathy. Disc space narrowing is greatest at C3-C4, C4-C5, C5-C6, C6-C7 and T1-T2 (advanced at these levels). Facet ankylosis bilaterally at C3-C4. Degenerative changes also present at the C1-C2 articulation (including degenerative cystic changes within the dens). Multilevel ventral osteophytes. Upper chest: No consolidation within the imaged lung apices. No visible pneumothorax. IMPRESSION: CT head: 1.  No evidence of an acute intracranial abnormality. 2. Parenchymal atrophy and chronic small vessel ischemic disease. 3. Known 11 mm pituitary cyst. No appreciable mass effect upon, or invasion of, regional structures. No further imaging evaluation is necessary. This follows ACR consensus guidelines: Management of Incidental Pituitary Findings on CT, MRI and F18-FDG PET: A White Paper of the ACR Incidental  Findings Committee. J Am Coll Radiol 2018; 15: 966-72. 4. Paranasal sinus disease as described. CT cervical spine: 1. No evidence of an acute cervical spine fracture. 2. Nonspecific reversal of the expected cervical lordosis. 3. Grade 1 anterolisthesis at C2-C3, C3-C4, C4-C5 and C7-T1. Electronically Signed   By: Rockey Childs D.O.   On: 05/10/2024 10:44   CT Cervical Spine Wo Contrast Result Date: 05/10/2024 CLINICAL DATA:  Provided history: Fall from standing.  Dizziness. EXAM: CT HEAD WITHOUT CONTRAST CT CERVICAL SPINE WITHOUT CONTRAST TECHNIQUE: Multidetector CT imaging of the head and cervical spine was performed following the standard protocol without intravenous contrast. Multiplanar CT image reconstructions of the cervical spine were also generated. RADIATION DOSE REDUCTION: This exam was performed according to the departmental dose-optimization program which includes automated exposure control, adjustment of the mA and/or kV according to patient size and/or use of iterative reconstruction technique. COMPARISON:  Brain MRI 01/05/2023. FINDINGS: CT HEAD FINDINGS Brain: Generalized cerebral atrophy. Moderate patchy and ill-defined hypoattenuation within the cerebral white matter and within/bout the basal ganglia, nonspecific but compatible with chronic small vessel ischemic disease. Known 11 mm pituitary cyst, as was demonstrated on the prior brain MRI of 01/05/2023. No appreciable mass effect upon, or invasion of, regional structures. There is no acute intracranial hemorrhage. No demarcated cortical infarct. No extra-axial fluid collection. No evidence of an intracranial mass. No midline shift. Vascular: No hyperdense vessel.  Atherosclerotic calcifications. Skull: No calvarial fracture or aggressive osseous lesion. Sinuses/Orbits:  No mass or acute finding within the imaged orbits. Trace mucosal thickening within the right maxillary sinus. Severe right anterior ethmoid and right frontal sinusitis. CT  CERVICAL SPINE FINDINGS Alignment: Non severe bursal the expected cervical lordosis. 3 mm C2-C3 grade 1 anterolisthesis. 4 mm C3-C4 grade 1 retrolisthesis. 3 mm C4-C5 grade 1 anterolisthesis. 3 mm C7-T1 grade 1 anterolisthesis. Skull base and vertebrae: The basion-dental and atlanto-dental intervals are maintained.No evidence of acute fracture to the cervical spine. Soft tissues and spinal canal: No prevertebral fluid or swelling. No visible canal hematoma. Disc levels: Cervical spondylosis with multilevel disc space narrowing, disc bulges, posterior disc osteophyte complexes, endplate spurring, uncovertebral hypertrophy and facet arthropathy. Disc space narrowing is greatest at C3-C4, C4-C5, C5-C6, C6-C7 and T1-T2 (advanced at these levels). Facet ankylosis bilaterally at C3-C4. Degenerative changes also present at the C1-C2 articulation (including degenerative cystic changes within the dens). Multilevel ventral osteophytes. Upper chest: No consolidation within the imaged lung apices. No visible pneumothorax. IMPRESSION: CT head: 1.  No evidence of an acute intracranial abnormality. 2. Parenchymal atrophy and chronic small vessel ischemic disease. 3. Known 11 mm pituitary cyst. No appreciable mass effect upon, or invasion of, regional structures. No further imaging evaluation is necessary. This follows ACR consensus guidelines: Management of Incidental Pituitary Findings on CT, MRI and F18-FDG PET: A White Paper of the ACR Incidental Findings Committee. J Am Coll Radiol 2018; 15: 966-72. 4. Paranasal sinus disease as described. CT cervical spine: 1. No evidence of an acute cervical spine fracture. 2. Nonspecific reversal of the expected cervical lordosis. 3. Grade 1 anterolisthesis at C2-C3, C3-C4, C4-C5 and C7-T1. Electronically Signed   By: Rockey Childs D.O.   On: 05/10/2024 10:44    Microbiology: Results for orders placed or performed during the hospital encounter of 05/10/24  Culture, blood (Routine X 2) w  Reflex to ID Panel     Status: None (Preliminary result)   Collection Time: 05/10/24  3:20 PM   Specimen: BLOOD LEFT ARM  Result Value Ref Range Status   Specimen Description BLOOD LEFT ARM  Final   Special Requests   Final    BOTTLES DRAWN AEROBIC AND ANAEROBIC Blood Culture adequate volume   Culture  Setup Time   Final    GRAM POSITIVE COCCI AEROBIC BOTTLE ONLY Organism ID to follow Performed at [facility], [address] [place], [place] [address]    Culture PENDING  Incomplete   Report Status PENDING  Incomplete   *Note: Due to a large number of results and/or encounters for the requested time period, some results have not been displayed. A complete set of results can be found in Results Review.    Labs: CBC: Recent Labs  Lab 05/10/24 0933 05/10/24 1520 05/11/24 0304  WBC 14.2* 19.6* 12.4*  NEUTROABS 11.5*  --   --   HGB 15.7* 14.8 13.6  HCT 49.3* 45.7 43.1  MCV 101.0* 99.8 101.7*  PLT 267 271 264   Basic Metabolic Panel: Recent Labs  Lab 05/10/24 1032 05/10/24 1520 05/11/24 0304  NA 139  --  140  K 4.3  --  4.6  CL 101  --  100  CO2 26  --  27  GLUCOSE 155*  --  167*  BUN 31*  --  34*  CREATININE 1.04* 0.86 1.55*  CALCIUM  8.9  --  8.3*   Liver Function Tests: Recent Labs  Lab 05/11/24 0304  AST 50*  ALT 24  ALKPHOS 57  BILITOT 0.9  PROT 5.9*  ALBUMIN  3.3*   CBG: Recent Labs  Lab 05/10/24 1638 05/10/24 1822 05/10/24 2056 05/11/24 0721  GLUCAP 174* 158* 142* 126*    Discharge time spent: greater than 30 minutes.  This record has been created using Conservation officer, historic buildings. Errors have been sought and corrected,but may not always be located. Such creation errors do not reflect on the standard of care.   Signed: Amaryllis Dare, MD [facility] 05/11/2024

## 2024-05-11 NOTE — Progress Notes (Signed)
 PHARMACY - PHYSICIAN COMMUNICATION CRITICAL VALUE ALERT - BLOOD CULTURE IDENTIFICATION (BCID)  [name] is an 78 y.o. female who presented to [facility] on (Not on file) with a chief complaint of fall from home with an attempted closed reduction of right shoulder fracture dislocation 9/2 with orthopedics.   Assessment:  Staph epidermidis (1/4 bottles, aerobic) which is likely a contaminant (include suspected source if known)  Name of physician (or Provider) Contacted: Amaryllis Dare, MD  Current antibiotics: None  Changes to prescribed antibiotics recommended:  Recommend no antibiotics at this time given that this is likely a contaminant. Provider agreed with assessment and plan.   No results found. However, due to the size of the patient record, not all encounters were searched. Please check Results Review for a complete set of results.  Feliciano Close, PharmD PGY2 Infectious Diseases Pharmacy Resident  05/11/2024 9:29 AM

## 2024-05-11 NOTE — Transfer of Care (Signed)
 Immediate Anesthesia Transfer of Care Note  Patient: [name]  Procedure(s) Performed: OPEN REDUCTION INTERNAL FIXATION (ORIF) RIGHT PROXIMAL HUMERUS FRACTURE DISLOCATION (Right: Shoulder)  Patient Location: PACU  Anesthesia Type:General  Level of Consciousness: drowsy  Airway & Oxygen Therapy: Patient Spontanous Breathing and Patient connected to face mask oxygen  Post-op Assessment: Report given to RN and Post -op Vital signs reviewed and stable  Post vital signs: Reviewed and stable  Last Vitals:  Vitals Value Taken Time  BP 140/59 05/11/24 18:27  Temp    Pulse 79 05/11/24 18:29  Resp 25 05/11/24 18:30  SpO2 98 % 05/11/24 18:29  Vitals shown include unfiled device data.  Last Pain:  Vitals:   05/11/24 1330  TempSrc:   PainSc: 4       Patients Stated Pain Goal: 2 (05/11/24 1330)  Complications: No notable events documented.

## 2024-05-11 NOTE — H&P (Addendum)
 History and Physical    Patient: [name] FMW:[id number] DOB: 08-25-1946 DOA: 05/11/2024 DOS: the patient was seen and examined on 05/11/2024 PCP: Jimmy Charlie FERNS, MD  Patient coming from: Transferred from [facility]  Chief Complaint: No chief complaint on file.  HPI: [name] is a 78 y.o. female with medical history significant of complete heart block s/p Micra AV 12/06/2021, HTN, HLD, HFpEF, type II DM, dementia, CKD who presented to the ED after a fall at home 2 nights ago. She experienced dizziness leading to a fall where she landed on her knees and rolled to her right, resulting in a shoulder injury described as a 'crushed shoulder.' No loss of consciousness occurred during the incident.  She has a history of pacemaker implantation due to previously low blood pressure. She took her blood pressure medication the night before the fall, which occurred on a Tuesday. She normally uses a walker for mobility and was using it at the time of the fall.  She is currently on oxygen, although she is not usually on oxygen at home. She is uncertain about her shortness of breath, noting that she takes a breath after walking and sitting in her chair.  Her son notes that her blood pressure has been stable since the pacemaker was placed. She was given 150 mcg fentanyl  by EMS.  Upon arrival to the ED at [facility], patient is found to be anterior dislocation of right shoulder x-ray.  While in the ED  they tried to reduce the shoulder with propofol , Dilaudid , Zofran , morphine , and diazepam .  It was not successful.  Repeat x-ray revealed concerns for anterior dislocation of the right humeral head with displaced greater tuberosity fracture fragment with hill- sachs lesion. Orthopedic consulted and evaluated the patient and cardiology  for preoperative evaluation.  Attempts at reduction of the dislocation of the right shoulder were also unsuccessful with Dr. Edie orthopedics for which transfer to [facility] for  surgical intervention was requested. Upon arrival patient blood pressures were noted to be as low as 97/54.  Labs significant for WBC 12.4, BUN 34,  and creatinine 1.55.  It seems that she had been given a dose of   Review of Systems: As mentioned in the history of present illness. All other systems reviewed and are negative. Past Medical History:  Diagnosis Date   Arthritis    Confusion    Diabetes mellitus without complication (HCC)    type 2   Hepatitis    pt. denies   Hypertension    Lumbar stenosis    Memory loss    Nocturia    Numbness and tingling    legs and feet   Wears glasses    Past Surgical History:  Procedure Laterality Date   BACK SURGERY     CESAREAN SECTION  1978   COLONOSCOPY     HEMORRHOID SURGERY  2015   JOINT REPLACEMENT     01-08-18 Dr. KYM Poli   PACEMAKER LEADLESS INSERTION N/A 12/06/2021   Procedure: PACEMAKER LEADLESS INSERTION;  Surgeon: Ammon Blunt, MD;  Location: [facility] INVASIVE CV LAB;  Service: Cardiovascular;  Laterality: N/A;   TOTAL HIP ARTHROPLASTY Left 01/08/2018   Procedure: LEFT TOTAL HIP ARTHROPLASTY ANTERIOR APPROACH;  Surgeon: Poli Lonni GRADE, MD;  Location: [facility] ORS;  Service: Orthopedics;  Laterality: Left;   TRANSFORAMINAL LUMBAR INTERBODY FUSION (TLIF) WITH PEDICLE SCREW FIXATION 2 LEVEL N/A 11/07/2015   Procedure: Lumbar three-four Lumbar four-five  transforaminal lumbar interbody fusion with interbody prosthesis posterior lateral  arthrodesis and posterior segmental instrumentation;  Surgeon: Reyes Budge, MD;  Location: [facility] NEURO ORS;  Service: Neurosurgery;  Laterality: N/A;   Social History:  reports that she has never smoked. She has never been exposed to tobacco smoke. She has never used smokeless tobacco. She reports that she does not drink alcohol and does not use drugs.  No Known Allergies  Family History  Problem Relation Age of Onset   Hypertension Other    Cancer Other        ovarian   Cancer Mother     Heart disease Father     Prior to Admission medications   Medication Sig Start Date End Date Taking? Authorizing Provider  ACCU-CHEK SMARTVIEW test strip Use to check blood sugar once daily 03/21/24   Letvak, Richard I, MD  ARIPiprazole  (ABILIFY ) 2 MG tablet Take 2 mg by mouth daily. 04/27/24   [provider]  aspirin  EC 81 MG tablet Take 81 mg by mouth 3 (three) times a week.    [provider]  atorvastatin  (LIPITOR ) 80 MG tablet TAKE 1 TABLET BY MOUTH ONCE  DAILY 01/20/24   Jimmy Charlie FERNS, MD  cholecalciferol  (VITAMIN D3) 25 MCG (1000 UNIT) tablet Take 1,000 Units by mouth daily.    [provider]  donepezil  (ARICEPT ) 10 MG tablet TAKE 1 TABLET BY MOUTH EVERY  MORNING 03/17/24   Jimmy Charlie I, MD  DULoxetine  (CYMBALTA ) 60 MG capsule TAKE 1 CAPSULE BY MOUTH DAILY 05/04/24   Jimmy Charlie FERNS, MD  gabapentin  (NEURONTIN ) 300 MG capsule Take 2 capsules (600 mg total) by mouth 2 (two) times daily. And 900 mg at bedtime 09/23/23   Letvak, Richard I, MD  lisinopril -hydrochlorothiazide  (ZESTORETIC ) 20-12.5 MG tablet TAKE 2 TABLETS BY MOUTH DAILY 03/30/24   Letvak, Richard I, MD  metFORMIN  (GLUCOPHAGE -XR) 500 MG 24 hr tablet Take 1 tablet (500 mg total) by mouth daily with breakfast. 04/27/24   Jimmy Charlie FERNS, MD  metoprolol  succinate (TOPROL -XL) 25 MG 24 hr tablet Take 1 tablet (25 mg total) by mouth daily. 02/02/24   Jimmy Charlie FERNS, MD  Multiple Vitamins-Minerals (MULTIVITAMIN WITH MINERALS) tablet Take 1 tablet by mouth daily.     [provider]  traMADol  (ULTRAM ) 50 MG tablet TAKE 1 TO 2 TABLETS BY MOUTH  EVERY 8 HOURS AS NEEDED 05/04/24   Jimmy Charlie FERNS, MD    Physical Exam: Vitals:   05/11/24 0904  BP: (!) 97/54  Pulse: 83  Resp: 18  Temp: 98.1 F (36.7 C)  TempSrc: Oral  SpO2: 94%   Constitutional: Elderly female currently in no acute distress Eyes: PERRL, lids and conjunctivae normal ENMT: Mucous membranes are moist. Normal dentition.   Neck: normal, supple  Respiratory: clear to auscultation bilaterally, no wheezing, no crackles. Normal respiratory effort. No accessory muscle use.  Cardiovascular: Regular rate and rhythm, no murmurs / rubs / gallops. No extremity edema. 2+ pedal pulses. No carotid bruits.  Abdomen: no tenderness, no masses palpated.   Bowel sounds positive.  Musculoskeletal: no clubbing / cyanosis.  Deformity of the right shoulder with bruising appreciated and arm currently in sling Skin: Oozing around the right shoulder. Neurologic: CN 2-12 grossly intact.  Strength 5/5 in all 4.  Psychiatric: Normal judgment and insight. Alert and oriented x 3. Normal mood.   Data Reviewed:  EKG reveals sinus tachycardia 108 bpm with nonspecific intraventricular conduction delay.  Reviewed labs, imaging, and pertinent records as documented.  Assessment and Plan:  Dislocation and fracture of  the right shoulder secondary to fall Patient presented after having a fall.  She reported feeling dizzy prior to and fell.  Initial x-ray revealed a anterior dislocation of the right shoulder.  Attempted reduction of the dislocation without success.  Subsequent x-rays notedanterior dislocation of the right humeral head with displaced greater tuberosity fracture fragment with hill- sachs lesion.  Patient was transferred to [facility] for surgical intervention. - Admit to progressive bed - N.p.o. for need for procedure. - Hydrocodone /morphine  IV as needed for moderate to severe pain respectively - Appreciate orthopedic consultative services, we will follow-up for any further recommendations  Essential hypertension Acute.  Blood pressures noted to be as low as 97/54.  Patient was placed on gentle IV fluid hydration. - Goal MAP greater than 65 - Continue IV fluids  - Plan to resume metoprolol  in a.m. - Determine when medically appropriate to resume patient's other blood pressure medications  Leukocytosis  Acute.  WBC trending down  from 14.2 ->12.4.  Thought secondary to stress due to dislocation and fracture.  Blood cultures were obtained.  1 of 4 bottles positive for Staph epidermidis but was thought likely to be a contaminant. - Recheck CBC tomorrow morning  Acute kidney injury Creatinine noted to be 1.55 with BUN 34.  Baseline creatinine had been 1.04 yesterday.  Patient had been given Lasix  which is thought to be acute drop.  Patient was placed on normal saline IV fluids. - Check urinalysis - Held lisinopril -hydrochlorothiazide  due to AKI  - Normal Saline IV fluids at 100 mL/h - Recheck kidney function in a.m.  Heart failure with preserved EF Patient was noted to be fairly euvolemic.  She was given a dose of Lasix  in the ED at [facility] yesterday prior to arrival.  Last echocardiogram noted EF to be 60 to 65% with grade 1 diastolic dysfunction when last checked back in 06/2020. - Strict I&O's and daily weights  Controlled diabetes mellitus type 2, without long-term use of insulin  Last available hemoglobin A1c noted to be 6.6 when checked on 01/01/2024. - Hypoglycemic protocols - Hold oral hypoglycemic agents - CBGs before every meal with sensitive SSI  Paroxysmal atrial fibrillation Complete heart block s/p pacemaker Patient not on anticoagulation for A-fib and per previous records was noted to have low A-fib burden for which patient was placed on aspirin  during office visit from 12/2023. - Goal potassium at least 4 and magnesium  at least 2.  Replace electrolytes as needed to goal-  - Continue metoprolol  in a.m.  Dementia - Continue Aricept , Cymbalta , Abilify  - Delirium precautions  Hyperlipidemia - Continue atorvastatin   DVT prophylaxis: Lovenox  Advance Care Planning:   Code Status: Full Code   Consults: Orthopedics Family Communication: Son updated over the phone Severity of Illness: The appropriate patient status for this patient is OBSERVATION. Observation status is judged to be reasonable and necessary  in order to provide the required intensity of service to ensure the patient's safety. The patient's presenting symptoms, physical exam findings, and initial radiographic and laboratory data in the context of their medical condition is felt to place them at decreased risk for further clinical deterioration. Furthermore, it is anticipated that the patient will be medically stable for discharge from the hospital within 2 midnights of admission.   Author: Maximino DELENA Sharps, MD 05/11/2024 10:47 AM  For on call review [email or web address].

## 2024-05-11 NOTE — H&P (View-Only) (Signed)
 Orthopaedic Trauma Service (OTS) Consult   Patient ID: [name] MRN: [id number] DOB/AGE: [birth year] 78 y.o.  Reason for Consult:Right proximal humerus fracture/dislocation Referring Physician: Dr. Edie, MD [facility]  HPI: [name] is an 78 y.o. female who is being seen in consultation at the request of Dr. Edie for evaluation of left proximal humerus fracture dislocation.  The patient had a ground-level fall with a left greater tuberosity fracture dislocation of his humerus.  She underwent attempted closed reduction in the operating room with Dr. Edie.  Attempted reduction was obtained however there was a fracture occurred with the humeral head still dislocated.  This consulted to assist with management due to the complexity of her injury.  She was transferred over here this morning to the preop area.  There is concerned about her blood pressure being a little bit low.  Hospitalist was admitted the patient at [facility] yesterday but unfortunately no one has seen her yet today.  She has also seen by cardiology who gave her clearance for the operating room yesterday.  Patient is right-hand dominant.  She is currently having pain but denies any numbness or tingling to the upper extremity.  She lives at home with her son.  She is ambulatory with a walker.  Past Medical History:  Diagnosis Date   Arthritis    Confusion    Diabetes mellitus without complication (HCC)    type 2   Hepatitis    pt. denies   Hypertension    Lumbar stenosis    Memory loss    Nocturia    Numbness and tingling    legs and feet   Wears glasses     Past Surgical History:  Procedure Laterality Date   BACK SURGERY     CESAREAN SECTION  1978   COLONOSCOPY     HEMORRHOID SURGERY  2015   JOINT REPLACEMENT     01-08-18 Dr. KYM Poli   PACEMAKER LEADLESS INSERTION N/A 12/06/2021   Procedure: PACEMAKER LEADLESS INSERTION;  Surgeon: Ammon Blunt, MD;  Location: [facility] INVASIVE CV LAB;  Service:  Cardiovascular;  Laterality: N/A;   TOTAL HIP ARTHROPLASTY Left 01/08/2018   Procedure: LEFT TOTAL HIP ARTHROPLASTY ANTERIOR APPROACH;  Surgeon: Poli Lonni GRADE, MD;  Location: [facility] ORS;  Service: Orthopedics;  Laterality: Left;   TRANSFORAMINAL LUMBAR INTERBODY FUSION (TLIF) WITH PEDICLE SCREW FIXATION 2 LEVEL N/A 11/07/2015   Procedure: Lumbar three-four Lumbar four-five  transforaminal lumbar interbody fusion with interbody prosthesis posterior lateral arthrodesis and posterior segmental instrumentation;  Surgeon: Reyes Budge, MD;  Location: [facility] NEURO ORS;  Service: Neurosurgery;  Laterality: N/A;    Family History  Problem Relation Age of Onset   Hypertension Other    Cancer Other        ovarian   Cancer Mother    Heart disease Father     Social History:  reports that she has never smoked. She has never been exposed to tobacco smoke. She has never used smokeless tobacco. She reports that she does not drink alcohol and does not use drugs.  Allergies: No Known Allergies  Medications: I have reviewed the patient's current medications.  ROS: Constitutional: No fever or chills Vision: No changes in vision ENT: No difficulty swallowing CV: No chest pain Pulm: No SOB or wheezing GI: No nausea or vomiting GU: No urgency or inability to hold urine Skin: No poor wound healing Neurologic: No numbness or tingling Psychiatric: No depression or anxiety Heme: No bruising Allergic: No reaction to medications  or food   Exam: Blood pressure (!) 97/54, pulse 83, temperature 98.1 F (36.7 C), temperature source Oral, resp. rate 18, SpO2 94%. General: No acute distress Orientation: Awake alert x 3 Mood and Affect: Pleasant Gait: Unable to assess due to her injury Coordination and balance: Within normal limits  Right upper extremity: Patient is in the sling and swath.  Moderate swelling to the left proximal arm.  No significant ecchymosis there but she does have significant  bruising to the elbow and swelling to the elbow and hand.  She has intact motor and sensory function to median, radial and ulnar nerve distribution.  She has intact sensation to the axillary nerve distribution.  She did not tolerate much attempted to abduct her shoulder with the deltoid to test the axillary nerve.  She got as a good palpable radial pulse with brisk cap refill and warm hand.  Left upper extremity: Skin without lesions. No tenderness to palpation. Full painless ROM, full strength in each muscle groups without evidence of instability.   Medical Decision Making: Data: Imaging: X-rays and CT scan have been reviewed which shows a right proximal humerus fracture dislocation.  Previously it was the greater tuberosity but unfortunately she has a metaphyseal fracture from the attempted closed reduction.  Labs:  Results for orders placed or performed during the hospital encounter of 05/11/24 (from the past 24 hours)  Glucose, capillary     Status: Abnormal   Collection Time: 05/11/24  9:14 AM  Result Value Ref Range   Glucose-Capillary 133 (H) 70 - 99 mg/dL  Glucose, capillary     Status: Abnormal   Collection Time: 05/11/24 11:06 AM  Result Value Ref Range   Glucose-Capillary 139 (H) 70 - 99 mg/dL   *Note: Due to a large number of results and/or encounters for the requested time period, some results have not been displayed. A complete set of results can be found in Results Review.     Imaging or Labs ordered: None  Medical history and chart was reviewed and case discussed with medical provider.  Assessment/Plan: 78 year old female with a right proximal humerus fracture dislocation status post closed reduction attempt that is failed.  Patient unfortunately continues to be dislocated.  We will plan to proceed today for open reduction internal fixation.  Risks and benefits were discussed with the patient.  Risk include but not limited to bleeding, infection, malunion, nonunion,  hardware failure, hardware irritation, nerve and blood vessel injury, posttraumatic arthritis, avascular necrosis, need for revision surgery, T, and the possibility anesthetic complications.  She agrees to proceed with surgery and consent was obtained.  I have talked with Dr. Darlyn (anesthesia) regarding her care.  He is concerned about her cardiac history with due to the hypotension as well as the limited workup for her heart since 2021 that is in the EPIC system.  I will defer to him regarding cardiology clearance here at [facility] as well as what further workup needs to be done.  I have moved her to the last case today.  I do feel her fracture and dislocation is a surgical urgency and I would like to proceed today safe and appropriate.   Franky MYRTIS Light, MD [facility] [phone] (office) [email or web address]

## 2024-05-11 NOTE — Op Note (Signed)
 Orthopaedic Surgery Operative Note (CSN: [id number] ) Date of Surgery: 05/11/2024  Admit Date: 05/11/2024   Diagnoses: Pre-Op Diagnoses: Right proximal humerus fracture/dislocation  Post-Op Diagnosis: Same  Procedures: CPT 23615-Open reduction internal fixation of right proximal humerus fracture CPT 23670-Open reduction of right shoulder dislocation  Surgeons : Primary: Kendal Franky SQUIBB, MD  Assistant: Lauraine Moores, PA-C  Location: OR 3   Anesthesia: General   Antibiotics: Ancef  2g preop with 1 gm vancomycin  powder placed topically   Tourniquet time: None    Estimated Blood Loss: 300 mL  Complications:None   Specimens:* No specimens in log *   Implants: Implant Name Type Inv. Item Serial No. Manufacturer Lot No. LRB No. Used Action  PLATE PROX HUM LO R 4H 83 - [id number] Plate PLATE PROX HUM LO R 4H 83  [employer](ORTH,TRAU,BIO,SG)  Right 1 Implanted  SCREW LOCK CORT STAR 3.5X38 - [id number] Screw SCREW LOCK CORT STAR 3.5X38  [employer](ORTH,TRAU,BIO,SG)  Right 1 Implanted  SCREW LOCK CORT STAR 3.5X40 - [id number] Screw SCREW LOCK CORT STAR 3.5X40  [employer](ORTH,TRAU,BIO,SG)  Right 1 Implanted  SCREW LOCK CORT STAR 3.5X40 - [id number] Screw SCREW LOCK CORT STAR 3.5X40  [employer](ORTH,TRAU,BIO,SG)  Right 1 Implanted  SCREW LOCK CORT STAR 3.5X48 - [id number] Screw SCREW LOCK CORT STAR 3.5X48  [employer](ORTH,TRAU,BIO,SG)  Right 1 Implanted  SCREW LOCK CORT STAR 3.5X36 - [id number] Screw SCREW LOCK CORT STAR 3.5X36  [employer](ORTH,TRAU,BIO,SG)  Right 1 Implanted  SCREW LOCK CORT STAR 3.5X34 - [id number] Screw SCREW LOCK CORT STAR 3.5X34  [employer](ORTH,TRAU,BIO,SG)  Right 1 Implanted  SCREW T15 MD 3.5X52MM NS - [id number] Screw SCREW T15 MD 3.5X52MM NS  [employer](ORTH,TRAU,BIO,SG)  Right 1 Implanted  SCREW LP NL T15 3.5X26 - [id number] Screw SCREW LP NL T15 3.5X26  [employer](ORTH,TRAU,BIO,SG)  Right 1 Implanted  SCREW LOW PROF TIS 3.5X28MM - [id number]  Screw SCREW LOW PROF TIS 3.5X28MM  [employer](ORTH,TRAU,BIO,SG)  Right 1 Implanted  SCREW LOW PROFILE 3.5X30MM TIS - [id number] Screw SCREW LOW PROFILE 3.5X30MM TIS  [employer](ORTH,TRAU,BIO,SG)  Right 1 Implanted     Indications for Surgery: 78 year old female who sustained a right shoulder fracture dislocation.  A closed reduction attempt was made but unfortunately she sustained a fracture of her metaphysis.  She remained dislocated and I was asked to help manage her.  Discussed risks and benefits to open duction internal fixation with the patient and her son.  Risks include but not limited to bleeding, infection, malunion, nonunion, hardware failure, hardware rotation, nerve and blood vessel injury, avascular necrosis, posttraumatic arthritis, even the possible anesthetic complications.  They agreed to proceed with surgery and consent was obtained.  Operative Findings: 1.  Open reduction of right shoulder dislocation without complication. 2.  Open reduction internal fixation of right proximal humerus fracture with greater tuberosity fracture using [employer] ALPS proximal humerus locking plate. 3.  Tuberosities were repaired with #2 FiberWire tied down through the plate.  Procedure: The patient was identified in the preoperative holding area. Consent was confirmed with the patient and their family and all questions were answered. The operative extremity was marked after confirmation with the patient. she was then brought back to the operating room by our anesthesia colleagues.  She was placed under general anesthetic and carefully transferred over to radiolucent flattop table.  The right upper extremity and shoulder were prepped and draped in usual sterile fashion.  A timeout was performed to verify the patient, the procedure, and the extremity.  Preoperative  antibiotics were dosed.  A standard deltopectoral approach was made and carried down through skin and subcutaneous tissue.  I was able  to identify the biceps tendon and used this as a landmark to palpate into the glenohumeral joint.  I was able to feel the proximal humerus in the humeral head and was able to grasp it with a Kocher.  I then was able to carefully manipulate the head back into reduction.  After I did this I was able to confirm with fluoroscopy that the head was reduced.  I then proceeded to place #2 FiberWire sutures in the supraspinatus and infraspinatus grasping the attachment to the greater tuberosity that was fractured.  I tagged these for later repair to the plate.  I then chose a [employer] Alps proximal humeral locking plate and positioned this appropriately on the lateral aspect of the humeral shaft.  I used the FiberWire sutures to keep the head out of varus and proceeded to drill and placed a nonlocking screw into the humeral shaft.  I then proceeded to place locking screws into the humeral head.  I confirmed with fluoroscopy that all the screws were within the humeral head and had not penetrated the articular surface.  I then returned to the humeral shaft and proceeded to place nonlocking screws in the shaft.  I then used my FiberWire and passed these through the plate and tied these down to repair the tuberosity.  Final fluoroscopic imaging was obtained.  The incision was copiously irrigated.  A gram of vancomycin  powder was placed to the incision.  A layered closure of 0 Vicryl, 2-0 Monocryl and 3-0 Monocryl Dermabond was used to close the skin.  Sterile dressings were applied.  The patient was then awoke from anesthesia and taken to the PACU in stable condition.  Post Op Plan/Instructions: Patient will be nonweightbearing to the right upper extremity.  She will be in a sling for comfort.  She can start some gentle pendulums postoperative day 1.  I would limit active abduction and forward elevation for the first 1 to 2 weeks.  We will have her mobilize with physical and Occupational Therapy.  I would recommend  aspirin  for DVT prophylaxis.  I was present and performed the entire surgery.  Lauraine Moores, PA-C did assist me throughout the case. An assistant was necessary given the difficulty in approach, maintenance of reduction and ability to instrument the fracture.   Franky Light, MD [facility]

## 2024-05-11 NOTE — Anesthesia Procedure Notes (Signed)
 Procedure Name: Intubation Date/Time: 05/11/2024 4:47 PM  Performed by: Mannie Krystal LABOR, CRNAPre-anesthesia Checklist: Patient identified, Emergency Drugs available, Suction available and Patient being monitored Patient Re-evaluated:Patient Re-evaluated prior to induction Oxygen Delivery Method: Circle system utilized Preoxygenation: Pre-oxygenation with 100% oxygen Induction Type: IV induction Ventilation: Mask ventilation without difficulty Laryngoscope Size: Glidescope and 3 Grade View: Grade I Tube type: Oral Tube size: 7.0 mm Number of attempts: 1 Airway Equipment and Method: Stylet Placement Confirmation: ETT inserted through vocal cords under direct vision, positive ETCO2 and breath sounds checked- equal and bilateral Secured at: 22 cm Tube secured with: Tape Dental Injury: Teeth and Oropharynx as per pre-operative assessment

## 2024-05-11 NOTE — Progress Notes (Signed)
 9182 EMTALA and medical necessity form completed. [facility] transporting pt to [place].

## 2024-05-11 NOTE — Interval H&P Note (Signed)
 History and Physical Interval Note:  05/11/2024 3:40 PM  [name]  has presented today for surgery, with the diagnosis of Right proximal humerus fracture.  The various methods of treatment have been discussed with the patient and family. After consideration of risks, benefits and other options for treatment, the patient has consented to  Procedure(s): OPEN REDUCTION INTERNAL FIXATION (ORIF) PROXIMAL HUMERUS FRACTURE (Right) as a surgical intervention.  The patient's history has been reviewed, patient examined, no change in status, stable for surgery.  I have reviewed the patient's chart and labs.  Questions were answered to the patient's satisfaction.     Rissa Turley P Chimamanda Siegfried

## 2024-05-12 ENCOUNTER — Encounter (HOSPITAL_COMMUNITY): Payer: Self-pay | Admitting: Student

## 2024-05-12 DIAGNOSIS — S43014A Anterior dislocation of right humerus, initial encounter: Secondary | ICD-10-CM | POA: Diagnosis not present

## 2024-05-12 LAB — BASIC METABOLIC PANEL WITH GFR
Anion gap: 17 — ABNORMAL HIGH (ref 5–15)
BUN: 29 mg/dL — ABNORMAL HIGH (ref 8–23)
CO2: 20 mmol/L — ABNORMAL LOW (ref 22–32)
Calcium: 8.3 mg/dL — ABNORMAL LOW (ref 8.9–10.3)
Chloride: 104 mmol/L (ref 98–111)
Creatinine, Ser: 1.31 mg/dL — ABNORMAL HIGH (ref 0.44–1.00)
GFR, Estimated: 42 mL/min — ABNORMAL LOW (ref >60)
Glucose, Bld: 170 mg/dL — ABNORMAL HIGH (ref 70–99)
Potassium: 4.4 mmol/L (ref 3.5–5.1)
Sodium: 141 mmol/L (ref 135–145)

## 2024-05-12 LAB — CBC
HCT: 39.5 % (ref 36.0–46.0)
Hemoglobin: 12.6 g/dL (ref 12.0–15.0)
MCH: 32.4 pg (ref 26.0–34.0)
MCHC: 31.9 g/dL (ref 30.0–36.0)
MCV: 101.5 fL — ABNORMAL HIGH (ref 80.0–100.0)
Platelets: 201 K/uL (ref 150–400)
RBC: 3.89 MIL/uL (ref 3.87–5.11)
RDW: 13.3 % (ref 11.5–15.5)
WBC: 11.8 K/uL — ABNORMAL HIGH (ref 4.0–10.5)
nRBC: 0 % (ref 0.0–0.2)

## 2024-05-12 LAB — VITAMIN D 25 HYDROXY (VIT D DEFICIENCY, FRACTURES): Vit D, 25-Hydroxy: 46.32 ng/mL (ref 30–100)

## 2024-05-12 MED ORDER — OXYCODONE HCL 5 MG PO TABS: 5.0000 mg | ORAL_TABLET | ORAL | Status: DC | PRN

## 2024-05-12 MED ORDER — MORPHINE SULFATE (PF) 2 MG/ML IV SOLN
0.5000 mg | INTRAVENOUS | Status: DC | PRN
  Administered 2024-05-12 – 2024-05-15 (×6): 1 mg via INTRAVENOUS
  Filled 2024-05-12 (×6): qty 1

## 2024-05-12 MED ORDER — MORPHINE SULFATE (PF) 2 MG/ML IV SOLN: 1.0000 mg | INTRAVENOUS | Status: DC | PRN

## 2024-05-12 MED ORDER — OXYCODONE HCL 5 MG PO TABS
2.5000 mg | ORAL_TABLET | ORAL | Status: DC | PRN
  Administered 2024-05-12 – 2024-05-16 (×15): 5 mg via ORAL
  Administered 2024-05-16: 2.5 mg via ORAL
  Administered 2024-05-16: 5 mg via ORAL
  Filled 2024-05-12 (×17): qty 1

## 2024-05-12 NOTE — Progress Notes (Signed)
  Progress Note   Patient: [name] FMW:[id number] DOB: 1946/07/09 DOA: 05/11/2024     1 DOS: the patient was seen and examined on 05/12/2024 at 0954      Brief hospital course: 78 y.o. F with dementia, lives at home, CKD, DM, dCHF, CHB s/p PPM who presented with fall and right shoulder dislocation.      Assessment and Plan:  Shoulder dislocation and fracture - Post-op care per Ortho   Hypertension Chronic diastolic CHF Atrial fibrillation, paroxysmal History of CHB s/p PPM Hyperlipidemia BP has normalized - Continue Lipitor  - Hold lisinopril -hydrochlorothiazide  and metoprolol  - Hold aspirin   AKI Improving - IV fluids - Hold lisonpril and hydrochlorothiazide   Dementia - Continue Abilify  and donepezil  and duloxetine        Subjective: She is responsive but short term memory seems inmpaired.  She reports hsoulder pain, neck discomfort.  No dyspnea, no fever, no respiraotry symptoms.     Physical Exam: BP 136/78 (BP Location: Left Arm)   Pulse 79   Temp (!) 97.5 F (36.4 C) (Oral)   Resp 18   SpO2 95%   General: Pt is alert, awake, in pain but not in acute distress Cardiovascular: RRR, nl S1-S2, no murmurs appreciated.   No LE edema.   Tele shows paced rhythm. Respiratory: Normal respiratory rate and rhythm.  CTAB without rales or wheezes.Diminshed overall Abdominal: Abdomen soft and non-tender.  No distension or HSM.   MSK: Oozing on dressing of right shoulder, no surronding redness or purulence Neuro/Psych: Face symmetric speech fluent, left arm strength seems normal, right arm limited and overall cooperation with movement poor.  Judgment and insight appear at bas[place].   Data Reviewed: BMP shows improved Cr, normal electrolyes CBC unremarkable     Family Communication:      Disposition: Status is: Inpatient         Author: Lonni SHAUNNA Dalton, MD 05/12/2024 5:54 PM  For on call review [email or web address].

## 2024-05-12 NOTE — Evaluation (Signed)
 Physical Therapy Evaluation  Patient Details Name: [name] MRN: [id number] DOB: [birth year] Today's Date: 05/12/2024  History of Present Illness  Pt is a 78 y/o female who presents 05/10/2024 s/p episode of dizziness in kitchen and fall onto R shoulder. She sustained a closed fracture dislocation of the right shoulder. Pt then sustained a proximal humerus fx with attempts at closed reduction in the OR and is now s/p ORIF on 05/11/2024. PMH significant for DM, hepatitis, HTN, memory loss, LE N/T, back surgery, pacemaker, L THA, L3-5 TLIF 2017.   Clinical Impression  Pt admitted with above diagnosis. Pt currently with functional limitations due to the deficits listed below (see PT Problem List). At the time of PT eval pt was able to perform transfer to/from EOB with up to +2 max assist and heavy use of bed pads to assist with positioning. Once sitting EOB, noted pt's linen was wet with sweat and urine. Time taken at end of session to place dry pads under pt, replace wet gown, replace wet pillowcases, and reposition pt in bed. Anticipate pt will require continued skilled PT services <3 hours/day at d/c. Acutely, pt will benefit from skilled PT to increase their independence and safety with mobility to allow discharge.           If plan is discharge home, recommend the following: Two people to help with walking and/or transfers;Two people to help with bathing/dressing/bathroom;Assistance with cooking/housework;Direct supervision/assist for medications management;Direct supervision/assist for financial management;Assistance with feeding;Assist for transportation;Help with stairs or ramp for entrance;Supervision due to cognitive status   Can travel by private vehicle   No    Equipment Recommendations Other (comment) (TBD by next venue of care)  Recommendations for Other Services       Functional Status Assessment Patient has had a recent decline in their functional status and demonstrates the  ability to make significant improvements in function in a reasonable and predictable amount of time.     Precautions / Restrictions Precautions Precautions: Fall;Shoulder Type of Shoulder Precautions: Gentle pendulums starting POD 1, and limited active shoulder abduction and forward elevation for 1-2 weeks. Shoulder Interventions: Shoulder sling/immobilizer;For comfort Precaution Booklet Issued: No Recall of Precautions/Restrictions: Impaired Required Braces or Orthoses: Sling Restrictions Weight Bearing Restrictions Per Provider Order: Yes RUE Weight Bearing Per Provider Order: Non weight bearing      Mobility  Bed Mobility Overal bed mobility: Needs Assistance Bed Mobility: Rolling, Supine to Sit, Sit to Supine Rolling: Max assist, Used rails   Supine to sit: Max assist, +2 for physical assistance, HOB elevated, Used rails Sit to supine: Max assist, +2 for physical assistance, Used rails   General bed mobility comments: +2 assist with use of rails and bed pad required for transition to EOB and to get feet on the floor.    Transfers                   General transfer comment: Unable to progress to OOB mobility at this time.    Ambulation/Gait                  Stairs            Wheelchair Mobility     Tilt Bed    Modified Rankin (Stroke Patients Only)       Balance Overall balance assessment: Needs assistance Sitting-balance support: Feet supported, No upper extremity supported Sitting balance-Leahy Scale: Zero Sitting balance - Comments: Up to max assist required. Short bouts (<30) of  min assist                                     Pertinent Vitals/Pain Pain Assessment Pain Assessment: Faces Faces Pain Scale: Hurts even more Pain Location: R shoulder Pain Descriptors / Indicators: Grimacing, Operative site guarding, Moaning Pain Intervention(s): Limited activity within patient's tolerance, Monitored during session,  Repositioned    Home Living Family/patient expects to be discharged to:: Private residence Living Arrangements: Children   Type of Home: Mobile home Home Access: Stairs to enter Entrance Stairs-Rails: Doctor, general practice of Steps: 6   Home Layout: One level Home Equipment: Educational psychologist (4 wheels);Hand held shower head;Grab bars - tub/shower      Prior Function Prior Level of Function : Needs assist             Mobility Comments: Pt states she has a cart with 4 wheels that she uses to walk with inside. If she goes out she uses the rollator. ADLs Comments: Son helps her set up for her shower, and washes her hair.  Pt reports she otherwise bathes and dresses herself. Son does most of the cooking and cleaning.     Extremity/Trunk Assessment   Upper Extremity Assessment Upper Extremity Assessment: RUE deficits/detail RUE Deficits / Details: Pt reports she has been moving her R hand and fingers, however did not demonstrate where I could see during session. Hand was covered by sling.    Lower Extremity Assessment Lower Extremity Assessment: Generalized weakness (Appears to have more active movement/muscular endurance on the RLE vs the L, however was able to perform hip flexion, hip abuction, hip adduction, and hip extension (bridging) without assist.)    Cervical / Trunk Assessment Cervical / Trunk Assessment: Other exceptions Cervical / Trunk Exceptions: Forward head posture with rounded shoulders  Communication   Communication Communication: No apparent difficulties    Cognition Arousal: Lethargic Behavior During Therapy: Anxious   PT - Cognitive impairments: History of cognitive impairments                         Following commands: Intact       Cueing Cueing Techniques: Verbal cues, Gestural cues     General Comments      Exercises General Exercises - Lower Extremity Heel Slides: 10 reps, Both Hip ABduction/ADduction: 10  reps, Both   Assessment/Plan    PT Assessment Patient needs continued PT services  PT Problem List Decreased strength;Decreased range of motion;Decreased activity tolerance;Decreased balance;Decreased mobility;Decreased cognition;Decreased knowledge of use of DME;Decreased safety awareness;Decreased knowledge of precautions;Pain       PT Treatment Interventions DME instruction;Gait training;Therapeutic activities;Functional mobility training;Therapeutic exercise;Balance training;Patient/family education    PT Goals (Current goals can be found in the Care Plan section)  Acute Rehab PT Goals Patient Stated Goal: Improve pain, be able to go home PT Goal Formulation: With patient Time For Goal Achievement: 05/19/24 Potential to Achieve Goals: Good    Frequency Min 2X/week     Co-evaluation PT/OT/SLP Co-Evaluation/Treatment: Yes Reason for Co-Treatment: Complexity of the patient's impairments (multi-system involvement);Necessary to address cognition/behavior during functional activity;For patient/therapist safety;To address functional/ADL transfers PT goals addressed during session: Mobility/safety with mobility;Balance         AM-PAC PT 6 Clicks Mobility  Outcome Measure Help needed turning from your back to your side while in a flat bed without using bedrails?: Total Help needed moving  from lying on your back to sitting on the side of a flat bed without using bedrails?: Total Help needed moving to and from a bed to a chair (including a wheelchair)?: Total Help needed standing up from a chair using your arms (e.g., wheelchair or bedside chair)?: Total Help needed to walk in hospital room?: Total Help needed climbing 3-5 steps with a railing? : Total 6 Click Score: 6    End of Session Equipment Utilized During Treatment: Oxygen (On post-op O2 throughout session - pt in no apparent distress, SpO2 not taken.) Activity Tolerance: Patient tolerated treatment well Patient left: in  bed;with call bell/phone within reach;with bed alarm set Nurse Communication: Mobility status PT Visit Diagnosis: History of falling (Z91.81);Pain;Difficulty in walking, not elsewhere classified (R26.2) Pain - Right/Left: Right Pain - part of body: Shoulder    Time: 8592-8552 PT Time Calculation (min) (ACUTE ONLY): 40 min   Charges:   PT Evaluation $PT Eval Moderate Complexity: 1 Mod PT Treatments $Gait Training: 8-22 mins PT General Charges $$ ACUTE PT VISIT: 1 Visit         Leita Sable, PT, DPT Acute Rehabilitation Services Secure Chat Preferred Office: [phone]   Leita JONETTA Sable 05/12/2024, 3:21 PM

## 2024-05-12 NOTE — Anesthesia Postprocedure Evaluation (Signed)
 Anesthesia Post Note  Patient: [name]  Procedure(s) Performed: attempted CLOSED REDUCTION, SHOULDER (Right: Shoulder)  Patient location during evaluation: PACU Anesthesia Type: General Level of consciousness: awake and alert Pain management: pain level controlled Vital Signs Assessment: post-procedure vital signs reviewed and stable Respiratory status: spontaneous breathing, nonlabored ventilation, respiratory function stable and patient connected to nasal cannula oxygen Cardiovascular status: blood pressure returned to baseline and stable Postop Assessment: no apparent nausea or vomiting Anesthetic complications: no   No notable events documented.   Last Vitals:  Vitals:   05/11/24 0715 05/11/24 0812  BP: 101/76 121/67  Pulse: 92 67  Resp: 19   Temp: 36.7 C   SpO2: 91%     Last Pain:  Vitals:   05/11/24 0754  TempSrc:   PainSc: 6                  Camellia Merilee Louder

## 2024-05-12 NOTE — Progress Notes (Signed)
 Orthopaedic Trauma Progress Note  SUBJECTIVE: Patient reports constant, severe pain about operative site this morning.  Received oxycodone  about 30 minutes ago, states it has not kicked in yet.  Denies any numbness or tingling throughout the right upper extremity.  Has not been out of bed since surgery yesterday.  No chest pain. No SOB. No nausea/vomiting. No other complaints.  No family at bedside currently, but I was able to speak briefly with her son Selena) on the phone during exam.  OBJECTIVE:  Vitals:   05/12/24 0350 05/12/24 0818  BP: 131/68 (!) 144/77  Pulse: 74 79  Resp: 18 17  Temp: 97.6 F (36.4 C) 98.7 F (37.1 C)  SpO2: 95% 97%    Opiates Today (MME): Today's  total administered Morphine  Milligram Equivalents: 7.5 Opiates Yesterday (MME): Yesterday's total administered Morphine  Milligram Equivalents: 131.25  General: Laying in bed, uncomfortable Respiratory: No increased work of breathing.  Operative Extremity (right upper extremity): Sling in place, Mepilex dressing with bloody drainage.  Dressing removed, small amount of bloody/serosanguineous drainage from proximal portion of incision.  Remainder of incision appears stable.  Bruising noted throughout the upper extremity.  Nontender over the elbow, forearm, wrist, hand.  Tolerates gentle elbow and wrist range of motion.  Motor and sensory function intact to the radial, ulnar, medial nerve distribution.  Sensation intact over the axillary nerve distribution.  Fingers warm well-perfused. + Radial pulse  IMAGING: Stable post op imaging right shoulder  LABS:  Results for orders placed or performed during the hospital encounter of 05/11/24 (from the past 24 hours)  Glucose, capillary     Status: Abnormal   Collection Time: 05/11/24 12:59 PM  Result Value Ref Range   Glucose-Capillary 134 (H) 70 - 99 mg/dL  Glucose, capillary     Status: Abnormal   Collection Time: 05/11/24  2:54 PM  Result Value Ref Range    Glucose-Capillary 118 (H) 70 - 99 mg/dL   Comment 1 Notify RN    Comment 2 Document in Chart   Glucose, capillary     Status: Abnormal   Collection Time: 05/11/24  6:56 PM  Result Value Ref Range   Glucose-Capillary 146 (H) 70 - 99 mg/dL  CBC     Status: Abnormal   Collection Time: 05/12/24  7:34 AM  Result Value Ref Range   WBC 11.8 (H) 4.0 - 10.5 K/uL   RBC 3.89 3.87 - 5.11 MIL/uL   Hemoglobin 12.6 12.0 - 15.0 g/dL   HCT 60.4 63.9 - 53.9 %   MCV 101.5 (H) 80.0 - 100.0 fL   MCH 32.4 26.0 - 34.0 pg   MCHC 31.9 30.0 - 36.0 g/dL   RDW 86.6 88.4 - 84.4 %   Platelets 201 150 - 400 K/uL   nRBC 0.0 0.0 - 0.2 %  VITAMIN D  25 Hydroxy (Vit-D Deficiency, Fractures)     Status: None   Collection Time: 05/12/24 10:59 AM  Result Value Ref Range   Vit D, 25-Hydroxy 46.32 30 - 100 ng/mL  Basic metabolic panel with GFR     Status: Abnormal   Collection Time: 05/12/24 10:59 AM  Result Value Ref Range   Sodium 141 135 - 145 mmol/L   Potassium 4.4 3.5 - 5.1 mmol/L   Chloride 104 98 - 111 mmol/L   CO2 20 (L) 22 - 32 mmol/L   Glucose, Bld 170 (H) 70 - 99 mg/dL   BUN 29 (H) 8 - 23 mg/dL   Creatinine, Ser 8.68 (H) 0.44 -  1.00 mg/dL   Calcium  8.3 (L) 8.9 - 10.3 mg/dL   GFR, Estimated 42 (L) >60 mL/min   Anion gap 17 (H) 5 - 15   *Note: Due to a large number of results and/or encounters for the requested time period, some results have not been displayed. A complete set of results can be found in Results Review.    ASSESSMENT: [name] is a 78 y.o. female, 1 Day Post-Op s/p fall Procedures: ORIF RIGHT PROXIMAL HUMERUS FRACTURE DISLOCATION  CV/Blood loss: Hemoglobin 12.6 this morning, down from 15.7 on admission.  Hemodynamically stable  PLAN: Weightbearing: NWB RUE ROM: Ok for gentle pendulums.  Will with limited active abduction/forward elevation for first 1 to 2 weeks.   Incisional and dressing care: Reinforce dressings as needed  Showering: Hold off on showering for now.  Okay for  RUE incision to begin getting wet 05/15/2024 if there is no drainage. Orthopedic device(s): Sling RUE for comfort Pain management:  1. Tylenol  650 mg q 6 hours scheduled 2. Robaxin  500 mg q 6 hours PRN 3. Oxycodone  2.5-5 mg q 4 hours PRN 4. Morphine  0.5-1 mg q 2 hours PRN 5. Gabapentin  600 mg twice daily VTE prophylaxis: Lovenox , SCDs ID:  Ancef  2gm post op Foley/Lines:  No foley, KVO IVFs Impediments to Fracture Healing: Vitamin D  level 46, no additional supplementation needed  Dispo: PT/OT evaluation today, dispo pending.  Patient may require SNF at discharge.  Continue to work on pain control today.  Patient ok for discharge from ortho standpoint once cleared by medicine team and therapies  D/C recommendations: - Oxycodone , Robaxin , Tylenol , gabapentin  for pain control - Aspirin  81 mg daily x 30 days for DVT prophylaxis - No additional need for Vit D supplementation  Follow - up plan: 2 weeks after d/c for wound check and repeat x-rays   Contact information:  Franky Light MD, Lauraine Moores PA-C. After hours and holidays please check [email or web address] for group call information for [facility]   Lauraine PATRIC Moores, PA-C [phone] (office) [email or web address]

## 2024-05-12 NOTE — Evaluation (Signed)
 Occupational Therapy Evaluation Patient Details Name: [name] MRN: [id number] DOB: 1945/09/18 Today's Date: 05/12/2024   History of Present Illness   Pt is a 78 y/o female who presents 05/10/2024 s/p episode of dizziness in kitchen and fall onto R shoulder. She sustained a closed fracture dislocation of the right shoulder. Pt then sustained a proximal humerus fx with attempts at closed reduction in the OR and is now s/p ORIF on 05/11/2024. PMH significant for DM, hepatitis, HTN, memory loss, LE N/T, back surgery, pacemaker, L THA, L3-5 TLIF 2017.     Clinical Impressions At baseline performs ADLs Ind to [place] assist, functional mobility with Mod I with a cart or Rollator, and receives assistance for IADLs. Pt now presents with pain affecting functional level, impaired functional use of dominant R UE, decreased activity tolerance, decreased balance, impaired cognition (suspect at or near baseline), and decreased safety and independence with functional tasks. Pt currently demonstrating ability to complete ADLs largely with Set up to Total assist +2 from bed level and bed mobility with Max assist +1 to +2. Pt participated well in session but was limited by pain. Pt will benefit from acute OT services to address deficits and increase safety and independence with functional tasks. Post acute discharge, pt will benefit from intensive inpatient skilled rehab services < 3 hours per day.      If plan is discharge home, recommend the following:   Two people to help with walking and/or transfers;Two people to help with bathing/dressing/bathroom;Assistance with cooking/housework;Direct supervision/assist for medications management;Direct supervision/assist for financial management;Assist for transportation;Help with stairs or ramp for entrance;Supervision due to cognitive status     Functional Status Assessment   Patient has had a recent decline in their functional status and demonstrates the ability  to make significant improvements in function in a reasonable and predictable amount of time.     Equipment Recommendations   Other (comment) (defer to next level of care)     Recommendations for Other Services         Precautions/Restrictions   Precautions Precautions: Fall;Shoulder Type of Shoulder Precautions: Gentle pendulums starting POD 1, and limited active shoulder abduction and forward elevation for 1-2 weeks. Shoulder Interventions: Shoulder sling/immobilizer;For comfort Precaution Booklet Issued: No Recall of Precautions/Restrictions: Impaired Required Braces or Orthoses: Sling Restrictions Weight Bearing Restrictions Per Provider Order: Yes RUE Weight Bearing Per Provider Order: Non weight bearing     Mobility Bed Mobility Overal bed mobility: Needs Assistance Bed Mobility: Rolling, Supine to Sit, Sit to Supine Rolling: Max assist, Used rails   Supine to sit: Max assist, +2 for physical assistance, HOB elevated, Used rails Sit to supine: Max assist, +2 for physical assistance, Used rails   General bed mobility comments: +2 assist with use of rails and bed pad required for transition to EOB and to get feet on the floor.    Transfers                   General transfer comment: Unable to progress to OOB mobility at this time.      Balance Overall balance assessment: Needs assistance Sitting-balance support: Feet supported, No upper extremity supported Sitting balance-Leahy Scale: Zero Sitting balance - Comments: Up to max assist required. Short bouts (<30) of min assist                                   ADL either performed or  assessed with clinical judgement   ADL Overall ADL's : Needs assistance/impaired Eating/Feeding: Set up;Minimal assistance;Bed level   Grooming: Set up;Minimal assistance;Bed level   Upper Body Bathing: Maximal assistance;Bed level (+2 assist)   Lower Body Bathing: Total assistance;+2 for physical  assistance;+2 for safety/equipment;Bed level   Upper Body Dressing : Maximal assistance;Bed level   Lower Body Dressing: Total assistance;+2 for physical assistance;+2 for safety/equipment;Bed level     Toilet Transfer Details (indicate cue type and reason): unable at this time Toileting- Architect and Hygiene: Total assistance;+2 for physical assistance;+2 for safety/equipment;Bed level               Vision Baseline Vision/History: 1 Wears glasses Ability to See in Adequate Light: 0 Adequate (with glasses) Patient Visual Report: No change from baseline       Perception         Praxis         Pertinent Vitals/Pain Pain Assessment Pain Assessment: Faces Faces Pain Scale: Hurts even more Pain Location: R shoulder Pain Descriptors / Indicators: Grimacing, Operative site guarding, Moaning Pain Intervention(s): Limited activity within patient's tolerance, Monitored during session, Premedicated before session, Repositioned     Extremity/Trunk Assessment Upper Extremity Assessment Upper Extremity Assessment: RUE deficits/detail;LUE deficits/detail;Right hand dominant RUE Deficits / Details: Pt reports she has been moving her R hand and fingers, however did not demonstrate during session. Hand was covered by sling and pt declined all attempts of AROM/PROM of all joints of R UE due to pain this session. LUE Deficits / Details: generalized weakness; ROM, sensation, and coordination WFL   Lower Extremity Assessment Lower Extremity Assessment: Defer to PT evaluation   Cervical / Trunk Assessment Cervical / Trunk Assessment: Other exceptions Cervical / Trunk Exceptions: Forward head posture with rounded shoulders   Communication Communication Communication: No apparent difficulties   Cognition Arousal: Lethargic Behavior During Therapy: Anxious Cognition: History of cognitive impairments, No family/caregiver present to determine baseline, Cognition impaired      Awareness: Intellectual awareness intact, Online awareness impaired Memory impairment (select all impairments): Short-term memory, Working memory Attention impairment (select first level of impairment): Selective attention Executive functioning impairment (select all impairments): Organization, Sequencing, Reasoning, Problem solving OT - Cognition Comments: AAOx4 with diagnosis of dementia at baseline.                 Following commands: Impaired Following commands impaired: Follows multi-step commands inconsistently, Follows multi-step commands with increased time     Cueing  General Comments   Cueing Techniques: Verbal cues;Gestural cues      Exercises     Shoulder Instructions      Home Living Family/patient expects to be discharged to:: Private residence Living Arrangements: Children (son) Available Help at Discharge: Family Type of Home: Mobile home Home Access: Stairs to enter Entrance Stairs-Number of Steps: 6 Entrance Stairs-Rails: Right;Left Home Layout: One level     Bathroom Shower/Tub: Chief Strategy Officer: Handicapped height     Home Equipment: Educational psychologist (4 wheels);Hand held shower head;Grab bars - tub/shower          Prior Functioning/Environment Prior Level of Function : Needs assist             Mobility Comments: Pt states she has a cart with 4 wheels that she uses to walk with inside. If she goes out she uses the rollator. ADLs Comments: Son helps her set up for her shower, and washes her hair.  Pt reports she otherwise bathes and  dresses herself. Son does most of the cooking and cleaning.    OT Problem List: Decreased strength;Decreased range of motion;Decreased activity tolerance;Impaired balance (sitting and/or standing);Decreased coordination;Decreased knowledge of use of DME or AE;Decreased knowledge of precautions;Impaired UE functional use;Pain   OT Treatment/Interventions: Self-care/ADL  training;Therapeutic exercise;DME and/or AE instruction;Energy conservation;Therapeutic activities;Cognitive remediation/compensation;Patient/family education;Balance training      OT Goals(Current goals can be found in the care plan section)   Acute Rehab OT Goals Patient Stated Goal: to not be in pain and to return home OT Goal Formulation: With patient Time For Goal Achievement: 05/26/24 Potential to Achieve Goals: Fair ADL Goals Pt Will Perform Grooming: with set-up;with supervision;sitting (sitting at EOB with Fair balance for 3 or more minutes; adhering to R shoulder precautions) Pt Will Perform Upper Body Bathing: with min assist;with mod assist;sitting (adhering to R shoulder precautions) Pt Will Perform Upper Body Dressing: with min assist;with mod assist;sitting (adhering to R shoulder precautions) Pt Will Perform Lower Body Dressing: with mod assist;sit to/from stand;sitting/lateral leans (adhering to R shoulder precautions) Pt Will Transfer to Toilet: with min assist;ambulating;bedside commode (with least restrictive AD; adhering to R shoulder precautions) Pt Will Perform Toileting - Clothing Manipulation and hygiene: with mod assist;sitting/lateral leans;sit to/from stand (adhering to R shoulder precautions) Pt Will Perform Tub/Shower Transfer: Tub transfer;with min assist;tub bench;ambulating (with least restrictive AD; adhering to R shoulder precautions) Additional ADL Goal #1: Patient will demonstrate ability to participate in R shoulder therapeutic exercise program adhering to physician's orders with Min assist.   OT Frequency:  Min 2X/week    Co-evaluation PT/OT/SLP Co-Evaluation/Treatment: Yes Reason for Co-Treatment: Complexity of the patient's impairments (multi-system involvement);Necessary to address cognition/behavior during functional activity;For patient/therapist safety;To address functional/ADL transfers   OT goals addressed during session: ADL's and  self-care;Strengthening/ROM      AM-PAC OT 6 Clicks Daily Activity     Outcome Measure Help from another person eating meals?: A Little Help from another person taking care of personal grooming?: A Lot Help from another person toileting, which includes using toliet, bedpan, or urinal?: Total Help from another person bathing (including washing, rinsing, drying)?: A Lot Help from another person to put on and taking off regular upper body clothing?: A Lot Help from another person to put on and taking off regular lower body clothing?: Total 6 Click Score: 11   End of Session Equipment Utilized During Treatment: Other (comment);Oxygen (R UE sling) Nurse Communication: Mobility status;Other (comment) (Pt premedicated before session)  Activity Tolerance: Patient limited by pain Patient left: in bed;with call bell/phone within reach;with bed alarm set  OT Visit Diagnosis: Unsteadiness on feet (R26.81);Other abnormalities of gait and mobility (R26.89);History of falling (Z91.81);Muscle weakness (generalized) (M62.81);Other symptoms and signs involving cognitive function;Pain                Time: 8592-8552 OT Time Calculation (min): 40 min Charges:  OT General Charges $OT Visit: 1 Visit OT Evaluation $OT Eval Moderate Complexity: 1 Mod  Margarie Rockey HERO., OTR/L, MA Acute Rehab [phone]   Margarie FORBES Horns 05/12/2024, 7:47 PM

## 2024-05-12 NOTE — Progress Notes (Signed)
 Orthopedic Tech Progress Note Patient Details:  JALEXA PIFER [birth year] [id number]  Patient ID: Roselind CHRISTELLA Catching, female   DOB: February 14, 1946, 78 y.o.   MRN: [id number] RN called for readjustment of shoulder sling per patient requested. I get into the room and the patient is slouched over to the side. I tried lifting the shoulder into flexion at 90 degrees but it was intolerable. I explained to her why she felt like the sling isnt on correctly. Due to her pain levels, she could not on her own keep the shoulder at 90 degrees. To improvise, I placed two pillows under the arm to offer support and elevation. I again explained to the patient the sling is on correct, it just feels awkward due to recently having surgery and not having the strength to maintain elevation on her own. Patient understood. Giovanny Dugal L Dhamar Gregory 05/12/2024, 4:27 AM

## 2024-05-12 NOTE — Anesthesia Postprocedure Evaluation (Signed)
 Anesthesia Post Note  Patient: [name]  Procedure(s) Performed: OPEN REDUCTION INTERNAL FIXATION (ORIF) RIGHT PROXIMAL HUMERUS FRACTURE DISLOCATION (Right: Shoulder)     Patient location during evaluation: PACU Anesthesia Type: General Level of consciousness: sedated and patient cooperative Pain management: pain level controlled Vital Signs Assessment: post-procedure vital signs reviewed and stable Respiratory status: spontaneous breathing Cardiovascular status: stable Anesthetic complications: no   No notable events documented.  Last Vitals:  Vitals:   05/12/24 0350 05/12/24 0818  BP: 131/68 (!) 144/77  Pulse: 74 79  Resp: 18 17  Temp: 36.4 C 37.1 C  SpO2: 95% 97%    Last Pain:  Vitals:   05/12/24 0614  TempSrc:   PainSc: 5                  Norleen Pope

## 2024-05-13 DIAGNOSIS — S43014A Anterior dislocation of right humerus, initial encounter: Secondary | ICD-10-CM | POA: Diagnosis not present

## 2024-05-13 LAB — BLOOD CULTURE ID PANEL (REFLEXED) - BCID2

## 2024-05-13 LAB — CBC
HCT: 33.9 % — ABNORMAL LOW (ref 36.0–46.0)
Hemoglobin: 11.1 g/dL — ABNORMAL LOW (ref 12.0–15.0)
MCH: 32.6 pg (ref 26.0–34.0)
MCHC: 32.7 g/dL (ref 30.0–36.0)
MCV: 99.4 fL (ref 80.0–100.0)
Platelets: 206 K/uL (ref 150–400)
RBC: 3.41 MIL/uL — ABNORMAL LOW (ref 3.87–5.11)
RDW: 13.4 % (ref 11.5–15.5)
WBC: 11.5 K/uL — ABNORMAL HIGH (ref 4.0–10.5)
nRBC: 0 % (ref 0.0–0.2)

## 2024-05-13 LAB — CULTURE, BLOOD (ROUTINE X 2): Special Requests: ADEQUATE

## 2024-05-13 MED ORDER — ASPIRIN 81 MG PO TBEC
81.0000 mg | DELAYED_RELEASE_TABLET | ORAL | Status: DC
Start: 2024-05-13 — End: 2024-05-16
  Administered 2024-05-13 – 2024-05-16 (×2): 81 mg via ORAL
  Filled 2024-05-13 (×2): qty 1

## 2024-05-13 MED ORDER — METHOCARBAMOL 500 MG PO TABS: 500.0000 mg | ORAL_TABLET | Freq: Four times a day (QID) | ORAL | 0 refills | Status: AC | PRN

## 2024-05-13 MED ORDER — OXYCODONE HCL 5 MG PO TABS: 2.5000 mg | ORAL_TABLET | ORAL | 0 refills | Status: DC | PRN

## 2024-05-13 NOTE — NC FL2 (Signed)
 [place]  MEDICAID FL2 LEVEL OF CARE FORM     IDENTIFICATION  Patient Name: [name] Birthdate: [birth year] Sex: female Admission Date (Current Location): 05/11/2024  [facility] and [place] Number:  Producer, television/film/video and Address:  [facility] [place], [address] [place], [place] [address]      Provider Number: [id number]  Attending Physician Name and Address:  Jonel Lonni SQUIBB, *  Relative Name and Phone Number:  [name] [phone]  [phone]    Current Level of Care: Hospital Recommended Level of Care: Skilled Nursing Facility Prior Approval Number:    Date Approved/Denied:   PASRR Number: [id number]  Discharge Plan: SNF    Current Diagnoses: Patient Active Problem List   Diagnosis Date Noted   Fall at home, initial encounter 05/11/2024   Acute pain of right shoulder 05/11/2024   Closed fracture of right proximal humerus 05/11/2024   Fracture of right shoulder 05/11/2024   Leukocytosis 05/11/2024   AKI (acute kidney injury) (HCC) 05/11/2024   (HFpEF) heart failure with preserved ejection fraction (HCC) 05/11/2024   Controlled type 2 diabetes mellitus without complication, without long-term current use of insulin  (HCC) 05/11/2024   Hyperlipidemia 05/11/2024   Anterior dislocation of right shoulder 05/10/2024   Paroxysmal atrial fibrillation (HCC) 01/01/2024   SVT (supraventricular tachycardia) (HCC) 01/01/2024   Tremor 01/01/2023   History of lumbar fusion (L3-L5) 09/25/2022   Asthmatic bronchitis 09/04/2022   Complete heart block (HCC) 12/03/2021   HLD (hyperlipidemia) 12/03/2021   Memory loss 12/03/2021   Candidal intertrigo 03/29/2021   History of stroke 07/04/2020   Chronic diastolic CHF (congestive heart failure) (HCC) 06/10/2020   Stage 3a chronic kidney disease (HCC) 06/10/2020   Type II diabetes mellitus with renal manifestations (HCC) 06/10/2020   Abnormal MRI    Chronic narcotic dependence (HCC)  07/04/2019   Urge incontinence 10/07/2018   Venous insufficiency 10/07/2018   Status post total replacement of left hip 01/08/2018   Chronic pain syndrome 03/13/2017   Facet arthropathy, lumbar 03/13/2017   Right hip pain 08/06/2016   MDD (major depressive disorder), recurrent, in partial remission (HCC) 05/07/2016   Advance directive discussed with patient 01/15/2016   Lumbar spinal stenosis due to adjacent segment disease after fusion procedure 11/07/2015   Chronic back pain 03/29/2015   Preventative health care 01/10/2015   Primary osteoarthritis of right hip 11/28/2014   Peripheral neuropathy 11/28/2014   Morbid obesity (HCC) 04/05/2014   Type 2 diabetes, controlled, with neuropathy (HCC) 01/20/2014   MCI (mild cognitive impairment) 12/26/2013   IBS (irritable bowel syndrome) 12/27/2012   Failed back surgical syndrome 07/15/2011   Essential hypertension 08/06/2010    Orientation RESPIRATION BLADDER Height & Weight     Self, Time, Situation, Place  O2 Continent Weight: 217 lb 6 oz (98.6 kg) Height:     BEHAVIORAL SYMPTOMS/MOOD NEUROLOGICAL BOWEL NUTRITION STATUS      Continent Diet (see discharge summary)  AMBULATORY STATUS COMMUNICATION OF NEEDS Skin   Total Care Verbally Surgical wounds                       Personal Care Assistance Level of Assistance  Bathing, Feeding, Dressing, Total care Bathing Assistance: Maximum assistance Feeding assistance: Limited assistance Dressing Assistance: Maximum assistance Total Care Assistance: Maximum assistance   Functional Limitations Info  Sight, Hearing, Speech Sight Info: Adequate Hearing Info: Adequate Speech Info: Adequate    SPECIAL CARE FACTORS FREQUENCY  PT (By licensed PT), OT (  By licensed OT)     PT Frequency: 5x week OT Frequency: 5x week            Contractures Contractures Info: Not present    Additional Factors Info  Code Status, Allergies Code Status Info: full Allergies Info: NKA            Current Medications (05/13/2024):  This is the current hospital active medication list Current Facility-Administered Medications  Medication Dose Route Frequency Provider Last Rate Last Admin   acetaminophen  (TYLENOL ) tablet 650 mg  650 mg Oral Q6H Danton Domino A, PA-C   650 mg at 05/13/24 0600   albuterol  (PROVENTIL ) (2.5 MG/3ML) 0.083% nebulizer solution 2.5 mg  2.5 mg Nebulization Q6H PRN Danton Domino LABOR, PA-C       ARIPiprazole  (ABILIFY ) tablet 2 mg  2 mg Oral Daily Danton Domino LABOR, PA-C   2 mg at 05/13/24 9097   atorvastatin  (LIPITOR ) tablet 80 mg  80 mg Oral QHS Danton Domino LABOR, PA-C   80 mg at 05/12/24 2106   diphenhydrAMINE  (BENADRYL ) 12.5 MG/5ML elixir 12.5-25 mg  12.5-25 mg Oral Q4H PRN Danton Domino LABOR, PA-C       docusate sodium  (COLACE) capsule 100 mg  100 mg Oral BID Danton Domino LABOR, PA-C   100 mg at 05/13/24 9097   donepezil  (ARICEPT ) tablet 10 mg  10 mg Oral q morning Danton Domino LABOR, PA-C   10 mg at 05/13/24 9097   DULoxetine  (CYMBALTA ) DR capsule 60 mg  60 mg Oral Daily Danton Domino LABOR, PA-C   60 mg at 05/13/24 9096   enoxaparin  (LOVENOX ) injection 40 mg  40 mg Subcutaneous Q24H Danton Domino LABOR, PA-C   40 mg at 05/12/24 2106   gabapentin  (NEURONTIN ) capsule 600 mg  600 mg Oral BID Danton Domino LABOR, PA-C   600 mg at 05/13/24 9097   methocarbamol  (ROBAXIN ) tablet 500 mg  500 mg Oral Q6H PRN Danton Domino LABOR, PA-C   500 mg at 05/12/24 1151   Or   methocarbamol  (ROBAXIN ) injection 500 mg  500 mg Intravenous Q6H PRN Danton Domino LABOR, PA-C       metoCLOPramide  (REGLAN ) tablet 5-10 mg  5-10 mg Oral Q8H PRN Danton Domino LABOR, PA-C       Or   metoCLOPramide  (REGLAN ) injection 5-10 mg  5-10 mg Intravenous Q8H PRN Danton Domino LABOR, PA-C       metoprolol  succinate (TOPROL -XL) 24 hr tablet 25 mg  25 mg Oral Daily Danton Domino LABOR, PA-C   25 mg at 05/13/24 9097   morphine  (PF) 2 MG/ML injection 0.5-1 mg  0.5-1 mg Intravenous Q2H PRN Danton Domino LABOR, PA-C   1 mg at 05/13/24 9390    ondansetron  (ZOFRAN ) tablet 4 mg  4 mg Oral Q6H PRN Danton Domino LABOR, PA-C       Or   ondansetron  (ZOFRAN ) injection 4 mg  4 mg Intravenous Q6H PRN Danton Domino LABOR, PA-C       oxyCODONE  (Oxy IR/ROXICODONE ) immediate release tablet 2.5-5 mg  2.5-5 mg Oral Q4H PRN Danton Domino LABOR, PA-C   5 mg at 05/13/24 9097   polyethylene glycol (MIRALAX  / GLYCOLAX ) packet 17 g  17 g Oral Daily PRN Danton Domino LABOR, PA-C       sodium chloride  flush (NS) 0.9 % injection 3 mL  3 mL Intravenous Q12H Danton Domino LABOR, PA-C   3 mL at 05/13/24 9094     Discharge Medications: Please see discharge summary for a  list of discharge medications.  Relevant Imaging Results:  Relevant Lab Results:   Additional Information SSN: [id number]  Bridget, Cordella Simmonds, LCSW

## 2024-05-13 NOTE — Care Management Important Message (Signed)
 Important Message  Patient Details  Name: [name] MRN: [id number] Date of Birth: 11-12-1945   Important Message Given:  Yes - Medicare IM     Jon Cruel 05/13/2024, 3:58 PM

## 2024-05-13 NOTE — Progress Notes (Signed)
  Progress Note   Patient: [name] FMW:[id number] DOB: 11-18-1945 DOA: 05/11/2024     2 DOS: the patient was seen and examined on 05/13/2024 at 12:32AM      Brief hospital course: No notes on file     Assessment and Plan: Proximal right humerus fracture Humerus dislocation S/p ORIF right humerus 9/3 by Dr. Kendal Overall seems to be having poor functional status after surgery, too sedated to increase pain medicines, too much pain to move around - Postop care per orthopedics - NWB RUE - Okay for gentle pendulums - Change dressing as needed - No showering - RUE incision may get wet starting 9/7 - Sling RUE for comfort - Aspirin  81 daily for DVT prophylaxis   Hypertension Blood pressure slightly high - Resume metoprolol  - Hold lisinopril  and HCTZ  AKI Resolved - Hold lisinopril  and HCTZ  Paroxysmal atrial fibrillation History of complete heart block with pacemaker - Resume home aspirin , metoprolol   Chronic diastolic congestive heart failure No evidence of fluid overload at this time - Hold lisinopril , HCTZ  Hyperlipidemia - Continue Lipitor   Dementia - Continue Abilify , donepezil , and duloxetine       Subjective: Patient has a lot of pain in her right shoulder.  She is not mobilizing much.  Nursing reports she is not able to sit in the edge of the bed.  No fever, no respiratory symptoms, no edema.     Physical Exam: BP (!) 147/77 (BP Location: Left Arm)   Pulse 82   Temp 97.6 F (36.4 C)   Resp 18   Wt 98.6 kg   SpO2 94%   BMI 42.45 kg/m   Obese adult female, lying in bed, appears weak and tired, opens her eyes and answers questions, then goes back to sleep RRR, no murmurs, no peripheral edema Respiratory rate normal, lung sounds diminished due to body habitus but no rales or wheezes appreciated Abdomen soft, no tenderness palpation, no guarding    Data Reviewed: Basic metabolic panel shows creatinine down to 1.3 CBC shows hemoglobin  slightly down postoperatively, expected postop blood loss     Family Communication:     Disposition: Status is: Inpatient         Author: Lonni SHAUNNA Dalton, MD 05/13/2024 5:54 PM  For on call review [email or web address].

## 2024-05-13 NOTE — Hospital Course (Signed)
 78 y.o. F with dementia, lives at home, CKD, DM, dCHF, CHB s/p PPM who presented with fall and right shoulder dislocation.

## 2024-05-13 NOTE — Progress Notes (Signed)
 Orthopaedic Trauma Progress Note  SUBJECTIVE: Resting comfortably in bed this morning.  Noted pain increased to about 8/10 earlier, was given pain medication which seem to ease it off slightly.  Fairly comfortable currently.  Chest pain. No SOB. No nausea/vomiting. No other complaints.  No family at bedside currently.  Was seen by PT/OT yesterday, able to perform transfer to/from EOB with assistance  OBJECTIVE:  Vitals:   05/13/24 0430 05/13/24 0804  BP: (!) 155/77 (!) 147/72  Pulse: 79 84  Resp: 18 16  Temp: 98.2 F (36.8 C) 98.1 F (36.7 C)  SpO2:  99%    Opiates Today (MME): Today's  total administered Morphine  Milligram Equivalents: 3 Opiates Yesterday (MME): Yesterday's total administered Morphine  Milligram Equivalents: 21  General: Resting comfortably, no acute distress respiratory: No increased work of breathing.  Operative Extremity (right upper extremity): Sling in place, Mepilex dressing clean, dry, intact.  Swelling through the extremity as expected.  Bruising noted throughout the upper extremity.  Nontender over the elbow, forearm, wrist, hand.  Tolerates gentle elbow and wrist range of motion.  Motor and sensory function intact to the radial, ulnar, medial nerve distribution.  Sensation intact over the axillary nerve distribution.  Fingers warm well-perfused. + Radial pulse  IMAGING: Stable post op imaging right shoulder  LABS:  Results for orders placed or performed during the hospital encounter of 05/11/24 (from the past 24 hours)  VITAMIN D  25 Hydroxy (Vit-D Deficiency, Fractures)     Status: None   Collection Time: 05/12/24 10:59 AM  Result Value Ref Range   Vit D, 25-Hydroxy 46.32 30 - 100 ng/mL  Basic metabolic panel with GFR     Status: Abnormal   Collection Time: 05/12/24 10:59 AM  Result Value Ref Range   Sodium 141 135 - 145 mmol/L   Potassium 4.4 3.5 - 5.1 mmol/L   Chloride 104 98 - 111 mmol/L   CO2 20 (L) 22 - 32 mmol/L   Glucose, Bld 170 (H) 70 - 99  mg/dL   BUN 29 (H) 8 - 23 mg/dL   Creatinine, Ser 8.68 (H) 0.44 - 1.00 mg/dL   Calcium  8.3 (L) 8.9 - 10.3 mg/dL   GFR, Estimated 42 (L) >60 mL/min   Anion gap 17 (H) 5 - 15  CBC     Status: Abnormal   Collection Time: 05/13/24  3:58 AM  Result Value Ref Range   WBC 11.5 (H) 4.0 - 10.5 K/uL   RBC 3.41 (L) 3.87 - 5.11 MIL/uL   Hemoglobin 11.1 (L) 12.0 - 15.0 g/dL   HCT 66.0 (L) 63.9 - 53.9 %   MCV 99.4 80.0 - 100.0 fL   MCH 32.6 26.0 - 34.0 pg   MCHC 32.7 30.0 - 36.0 g/dL   RDW 86.5 88.4 - 84.4 %   Platelets 206 150 - 400 K/uL   nRBC 0.0 0.0 - 0.2 %   *Note: Due to a large number of results and/or encounters for the requested time period, some results have not been displayed. A complete set of results can be found in Results Review.    ASSESSMENT: [name] is a 78 y.o. female, 2 Days Post-Op s/p fall Procedures: ORIF RIGHT PROXIMAL HUMERUS FRACTURE DISLOCATION  CV/Blood loss: ABLA, hemoglobin 11.1 this morning.  Hemodynamically stable  PLAN: Weightbearing: NWB RUE ROM: Ok for gentle pendulums.  Will with limited active abduction/forward elevation for first 1 to 2 weeks.   Incisional and dressing care: Change dressing as needed Showering: Hold off  on showering for now.  Okay for RUE incision to begin getting wet 05/15/2024 if there is no drainage. Orthopedic device(s): Sling RUE for comfort Pain management:  1. Tylenol  650 mg q 6 hours scheduled 2. Robaxin  500 mg q 6 hours PRN 3. Oxycodone  2.5-5 mg q 4 hours PRN 4. Morphine  0.5-1 mg q 2 hours PRN 5. Gabapentin  600 mg twice daily VTE prophylaxis: Lovenox , SCDs ID:  Ancef  2gm post op completed Foley/Lines:  No foley, KVO IVFs Impediments to Fracture Healing: Vitamin D  level 46, no additional supplementation needed  Dispo: PT/OT evaluation ongoing, recommending SNF.  TOC following for placement.  Patient ok for discharge from ortho standpoint once cleared by medicine team and therapies.  I have signed and placed discharge  Rx for pain medication and muscle relaxer in patient's chart  D/C recommendations: - Oxycodone , Robaxin , Tylenol , home dose gabapentin  for pain control - Aspirin  81 mg daily x 30 days for DVT prophylaxis - No additional need for Vit D supplementation  Follow - up plan: 2 weeks after d/c for wound check and repeat x-rays   Contact information:  Franky Light MD, Lauraine Moores PA-C. After hours and holidays please check [email or web address] for group call information for [facility]   Lauraine PATRIC Moores, PA-C [phone] (office) [email or web address]

## 2024-05-13 NOTE — TOC Initial Note (Addendum)
 Transition of Care [facility]) - Initial/Assessment Note    Patient Details  Name: [name] MRN: [id number] Date of Birth: [birth year]  Transition of Care [facility]) CM/SW Contact:    Bridget Cordella Simmonds, LCSW Phone Number: 05/13/2024, 11:09 AM  Clinical Narrative:     CSW spoke with pt regarding PT recommendation for SNF. Pt is agreeable to SNF, medicare choice document provided, permission given to send out referral in hub.  Pt from home with son [name], permission given to speak with him.  No current home services.  Referral sent out in hub for SNF.    1335: Bed offers provided to pt, she is asking for help from her son [name] to choose.  Pt cell phone died while attempting to call him.  CSW also called, LM with son.              Expected Discharge Plan: Skilled Nursing Facility Barriers to Discharge: Continued Medical Work up, SNF Pending bed offer   Patient Goals and CMS Choice   CMS Medicare.gov Compare Post Acute Care list provided to:: Patient Choice offered to / list presented to : Patient      Expected Discharge Plan and Services In-house Referral: Clinical Social Work   Post Acute Care Choice: Skilled Nursing Facility Living arrangements for the past 2 months: Single Family Home                                      Prior Living Arrangements/Services Living arrangements for the past 2 months: Single Family Home Lives with:: Adult Children (son [name]) Patient language and need for interpreter reviewed:: Yes Do you feel safe going back to the place where you live?: Yes      Need for Family Participation in Patient Care: Yes (Comment) Care giver support system in place?: Yes (comment) Current home services: Other (comment) (none) Criminal Activity/Legal Involvement Pertinent to Current Situation/Hospitalization: No - Comment as needed  Activities of Daily Living   ADL Screening (condition at time of admission) Independently performs ADLs?: Yes  (appropriate for developmental age) Is the patient deaf or have difficulty hearing?: No Does the patient have difficulty seeing, even when wearing glasses/contacts?: No Does the patient have difficulty concentrating, remembering, or making decisions?: No  Permission Sought/Granted Permission sought to share information with : Family Supports Permission granted to share information with : Yes, Verbal Permission Granted  Share Information with NAME: son [name]  Permission granted to share info w AGENCY: SNF        Emotional Assessment Appearance:: Appears stated age Attitude/Demeanor/Rapport: Engaged Affect (typically observed): Appropriate, Pleasant Orientation: : Oriented to Self, Oriented to Place, Oriented to  Time, Oriented to Situation      Admission diagnosis:  Anterior dislocation of right shoulder [S43.014A] Patient Active Problem List   Diagnosis Date Noted   Fall at home, initial encounter 05/11/2024   Acute pain of right shoulder 05/11/2024   Closed fracture of right proximal humerus 05/11/2024   Fracture of right shoulder 05/11/2024   Leukocytosis 05/11/2024   AKI (acute kidney injury) (HCC) 05/11/2024   (HFpEF) heart failure with preserved ejection fraction (HCC) 05/11/2024   Controlled type 2 diabetes mellitus without complication, without long-term current use of insulin  (HCC) 05/11/2024   Hyperlipidemia 05/11/2024   Anterior dislocation of right shoulder 05/10/2024   Paroxysmal atrial fibrillation (HCC) 01/01/2024   SVT (supraventricular tachycardia) (HCC) 01/01/2024   Tremor 01/01/2023  History of lumbar fusion (L3-L5) 09/25/2022   Asthmatic bronchitis 09/04/2022   Complete heart block (HCC) 12/03/2021   HLD (hyperlipidemia) 12/03/2021   Memory loss 12/03/2021   Candidal intertrigo 03/29/2021   History of stroke 07/04/2020   Chronic diastolic CHF (congestive heart failure) (HCC) 06/10/2020   Stage 3a chronic kidney disease (HCC) 06/10/2020   Type II  diabetes mellitus with renal manifestations (HCC) 06/10/2020   Abnormal MRI    Chronic narcotic dependence (HCC) 07/04/2019   Urge incontinence 10/07/2018   Venous insufficiency 10/07/2018   Status post total replacement of left hip 01/08/2018   Chronic pain syndrome 03/13/2017   Facet arthropathy, lumbar 03/13/2017   Right hip pain 08/06/2016   MDD (major depressive disorder), recurrent, in partial remission (HCC) 05/07/2016   Advance directive discussed with patient 01/15/2016   Lumbar spinal stenosis due to adjacent segment disease after fusion procedure 11/07/2015   Chronic back pain 03/29/2015   Preventative health care 01/10/2015   Primary osteoarthritis of right hip 11/28/2014   Peripheral neuropathy 11/28/2014   Morbid obesity (HCC) 04/05/2014   Type 2 diabetes, controlled, with neuropathy (HCC) 01/20/2014   MCI (mild cognitive impairment) 12/26/2013   IBS (irritable bowel syndrome) 12/27/2012   Failed back surgical syndrome 07/15/2011   Essential hypertension 08/06/2010   PCP:  Jimmy Charlie FERNS, MD Pharmacy:   [facility] [phone] - [address] [place] - [address] [place] [address] Phone: [phone] Fax: [phone]  [facility] Delivery - [place], [place] - [address] [place] [address] Phone: [phone] Fax: [phone]     Social Drivers of Health (SDOH) Social History: SDOH Screenings   Food Insecurity: No Food Insecurity (05/11/2024)  Housing: Low Risk  (05/11/2024)  Transportation Needs: No Transportation Needs (05/11/2024)  Utilities: Not At Risk (05/11/2024)  Depression (PHQ2-9): Low Risk  (01/01/2024)  Financial Resource Strain: Low Risk  (12/15/2022)  Physical Activity: Unknown (12/15/2022)  Social Connections: Socially Isolated (05/11/2024)  Stress: No Stress Concern Present (12/15/2022)  Tobacco Use: Low Risk  (05/11/2024)   SDOH Interventions:     Readmission Risk Interventions     12/05/2021    3:32 PM  Readmission Risk Prevention Plan  Transportation Screening Complete  PCP or Specialist Appt within 5-7 Days Complete  Home Care Screening Complete  Medication Review (RN CM) Complete

## 2024-05-14 DIAGNOSIS — S43014A Anterior dislocation of right humerus, initial encounter: Secondary | ICD-10-CM | POA: Diagnosis not present

## 2024-05-14 LAB — BASIC METABOLIC PANEL WITH GFR
Anion gap: 11 (ref 5–15)
BUN: 20 mg/dL (ref 8–23)
CO2: 25 mmol/L (ref 22–32)
Calcium: 8.2 mg/dL — ABNORMAL LOW (ref 8.9–10.3)
Chloride: 102 mmol/L (ref 98–111)
Creatinine, Ser: 0.8 mg/dL (ref 0.44–1.00)
GFR, Estimated: >60 mL/min (ref >60)
Glucose, Bld: 155 mg/dL — ABNORMAL HIGH (ref 70–99)
Potassium: 4.1 mmol/L (ref 3.5–5.1)
Sodium: 138 mmol/L (ref 135–145)

## 2024-05-14 LAB — CBC
HCT: 35.2 % — ABNORMAL LOW (ref 36.0–46.0)
Hemoglobin: 11.2 g/dL — ABNORMAL LOW (ref 12.0–15.0)
MCH: 32.2 pg (ref 26.0–34.0)
MCHC: 31.8 g/dL (ref 30.0–36.0)
MCV: 101.1 fL — ABNORMAL HIGH (ref 80.0–100.0)
Platelets: 265 K/uL (ref 150–400)
RBC: 3.48 MIL/uL — ABNORMAL LOW (ref 3.87–5.11)
RDW: 13.2 % (ref 11.5–15.5)
WBC: 11.9 K/uL — ABNORMAL HIGH (ref 4.0–10.5)
nRBC: 0.3 % — ABNORMAL HIGH (ref 0.0–0.2)

## 2024-05-14 MED ORDER — HYDROCHLOROTHIAZIDE 12.5 MG PO TABS
12.5000 mg | ORAL_TABLET | Freq: Every day | ORAL | Status: DC
  Administered 2024-05-14 – 2024-05-16 (×3): 12.5 mg via ORAL
  Filled 2024-05-14 (×3): qty 1

## 2024-05-14 MED ORDER — LISINOPRIL 20 MG PO TABS
20.0000 mg | ORAL_TABLET | Freq: Every day | ORAL | Status: DC
  Administered 2024-05-14 – 2024-05-16 (×3): 20 mg via ORAL
  Filled 2024-05-14 (×3): qty 1

## 2024-05-14 MED ORDER — LISINOPRIL-HYDROCHLOROTHIAZIDE 20-12.5 MG PO TABS: 2.0000 | ORAL_TABLET | Freq: Every day | ORAL | Status: DC

## 2024-05-14 NOTE — Plan of Care (Signed)
  Problem: Pain Managment: Goal: General experience of comfort will improve and/or be controlled Outcome: Progressing   Problem: Safety: Goal: Ability to remain free from injury will improve Outcome: Progressing   Problem: Skin Integrity: Goal: Risk for impaired skin integrity will decrease Outcome: Progressing

## 2024-05-14 NOTE — TOC Progression Note (Addendum)
 Transition of Care [facility]) - Progression Note    Patient Details  Name: [name] MRN: [id number] Date of Birth: Jan 08, 1946  Transition of Care [facility]) CM/SW Contact  Isaiah Public, LCSWA Phone Number: 05/14/2024, 2:56 PM  Clinical Narrative:     Patient accepted SN[facility] bed with [facility]. [facility] confirmed they can accept patient tomorrow if we receive auth approval and patient medically ready. CSW will start insurance authorization for patient.  Update- CSW started insurance authorization for this patient. Auth ID# [id number]. Darrien with [facility] confirmed they can accept patient tomorrow if medically stable. CSW informed MD.  Expected Discharge Plan: Skilled Nursing Facility Barriers to Discharge: Continued Medical Work up, SNF Pending bed offer               Expected Discharge Plan and Services In-house Referral: Clinical Social Work   Post Acute Care Choice: Skilled Nursing Facility Living arrangements for the past 2 months: Single Family Home                                       Social Drivers of Health (SDOH) Interventions SDOH Screenings   Food Insecurity: No Food Insecurity (05/11/2024)  Housing: Low Risk  (05/11/2024)  Transportation Needs: No Transportation Needs (05/11/2024)  Utilities: Not At Risk (05/11/2024)  Depression (PHQ2-9): Low Risk  (01/01/2024)  Financial Resource Strain: Low Risk  (12/15/2022)  Physical Activity: Unknown (12/15/2022)  Social Connections: Socially Isolated (05/11/2024)  Stress: No Stress Concern Present (12/15/2022)  Tobacco Use: Low Risk  (05/11/2024)    Readmission Risk Interventions    12/05/2021    3:32 PM  Readmission Risk Prevention Plan  Transportation Screening Complete  PCP or Specialist Appt within 5-7 Days Complete  Home Care Screening Complete  Medication Review (RN CM) Complete

## 2024-05-14 NOTE — Progress Notes (Signed)
  Progress Note   Patient: [name] FMW:[id number] DOB: Dec 15, 1945 DOA: 05/11/2024     3 DOS: the patient was seen and examined on 05/14/2024 at 10:02AM      Brief hospital course: 78 y.o. F with dementia, lives at home, CKD, DM, dCHF, CHB s/p PPM who presented with fall and right shoulder dislocation.      Assessment and Plan: Proximal right humerus fracture Humerus dislocation S/p ORIF right humerus 9/3 by Dr. Kendal Overall seems to be having poor functional status after surgery, too sedated to increase pain medicines, too much pain to move around - Postop care per orthopedics - NWB RUE - Okay for gentle pendulums - Change dressing as needed - No showering - RUE incision may get wet starting 9/7 - Sling RUE for comfort - Aspirin  81 daily for DVT prophylaxis   Hypertension Blood pressure elevated - Continue metoprolol  - Resume lisinopril  and HCTZ  AKI Resolved - Resume lisinopril  and HCTZ  Paroxysmal atrial fibrillation History of complete heart block with pacemaker - Continue home aspirin , metoprolol   Chronic diastolic congestive heart failure No evidence of fluid overload at this time - Resume lisinopril , HCTZ  Hyperlipidemia - Continue Lipitor   Dementia - Continue Abilify , donepezil , and duloxetine       Subjective: No clinical change, no nursing concerns, no fever, chest pain or respiraotry distress     Physical Exam: BP (!) 179/93 (BP Location: Left Arm)   Pulse 82   Temp 97.9 F (36.6 C) (Oral)   Resp 18   Wt 99.6 kg   SpO2 97%   BMI 42.88 kg/m   Obese adult female, lying in bed, appears weak and tired, interactive  RRR, no murmurs, no peripheral edema Respiratory rate normal, lung sounds diminished due to body habitus but no rales or wheezes appreciated Abdomen soft, no tenderness palpation, no guarding    Data Reviewed: Basic metabolic panel shows creatinine down to 0.8 CBC shows hemoglobin slightly down postoperatively,  expected postop blood loss     Family Communication:     Disposition: Status is: Inpatient         Author: Lonni SHAUNNA Dalton, MD 05/14/2024 4:13 PM  For on call review [email or web address].

## 2024-05-15 ENCOUNTER — Inpatient Hospital Stay (HOSPITAL_COMMUNITY)

## 2024-05-15 DIAGNOSIS — S43014A Anterior dislocation of right humerus, initial encounter: Secondary | ICD-10-CM | POA: Diagnosis not present

## 2024-05-15 LAB — CULTURE, BLOOD (ROUTINE X 2): Culture: NO GROWTH

## 2024-05-15 MED ORDER — OXYCODONE HCL 5 MG PO TABS: 2.5000 mg | ORAL_TABLET | ORAL | 0 refills | Status: DC | PRN

## 2024-05-15 MED ORDER — SENNA 8.6 MG PO TABS
1.0000 | ORAL_TABLET | Freq: Every day | ORAL | Status: DC
  Administered 2024-05-15 – 2024-05-16 (×2): 8.6 mg via ORAL
  Filled 2024-05-15 (×2): qty 1

## 2024-05-15 MED ORDER — BISACODYL 10 MG RE SUPP
10.0000 mg | Freq: Every day | RECTAL | Status: DC | PRN
  Administered 2024-05-15: 10 mg via RECTAL
  Filled 2024-05-15: qty 1

## 2024-05-15 MED ORDER — LACTATED RINGERS IV BOLUS
1000.0000 mL | Freq: Once | INTRAVENOUS | Status: AC
  Administered 2024-05-15: 1000 mL via INTRAVENOUS

## 2024-05-15 MED ORDER — ASPIRIN 81 MG PO TBEC
81.0000 mg | DELAYED_RELEASE_TABLET | Freq: Every day | ORAL | Status: AC
Start: 2024-05-15 — End: 2024-06-14

## 2024-05-15 MED ORDER — BISACODYL 10 MG RE SUPP: 10.0000 mg | Freq: Every day | RECTAL | Status: AC | PRN

## 2024-05-15 MED ORDER — ACETAMINOPHEN 325 MG PO TABS: 650.0000 mg | ORAL_TABLET | Freq: Four times a day (QID) | ORAL | Status: AC | PRN

## 2024-05-15 MED ORDER — SENNA 8.6 MG PO TABS: 1.0000 | ORAL_TABLET | Freq: Every day | ORAL | Status: AC

## 2024-05-15 MED ORDER — POLYETHYLENE GLYCOL 3350 17 G PO PACK
17.0000 g | PACK | Freq: Every day | ORAL | Status: DC
  Administered 2024-05-15: 17 g via ORAL
  Filled 2024-05-15 (×2): qty 1

## 2024-05-15 MED ORDER — DOCUSATE SODIUM 100 MG PO CAPS: 100.0000 mg | ORAL_CAPSULE | Freq: Two times a day (BID) | ORAL | Status: AC

## 2024-05-15 MED ORDER — POLYETHYLENE GLYCOL 3350 17 G PO PACK: 17.0000 g | PACK | Freq: Every day | ORAL | Status: AC

## 2024-05-15 NOTE — Discharge Summary (Signed)
 Physician Discharge Summary   Patient: [name] MRN: [id number] DOB: Feb 24, 1946  Admit date:     05/11/2024  Discharge date: 05/15/24  Discharge Physician: Lonni SHAUNNA Dalton   PCP: Jimmy Charlie FERNS, MD     Recommendations at discharge:  Follow up with Dr. Kendal for shoulder ORIF in 1 week Follow up with PCP Dr. Jimmy 1 week after hospital discharge     Discharge Diagnoses: Principal Problem:   Proximal fracture and anterior dislocation of right shoulder Active Problems:   Fall at home, initial encounter   Essential hypertension   AKI (acute kidney injury) (HCC)   (HFpEF) heart failure with preserved ejection fraction (HCC)   Controlled type 2 diabetes mellitus without complication, without long-term current use of insulin  (HCC)   Complete heart block (HCC)   Paroxysmal atrial fibrillation (HCC)   Hyperlipidemia      Hospital Course: 78 y.o. F with dementia, lives at home, CKD, DM, dCHF, CHB s/p PPM who presented with fall and right shoulder dislocation.      Proximal right humerus fracture Humerus dislocation S/p ORIF right humerus 9/3 by Dr. Kendal Admitted and taken to the OR on 9/3 by Dr. Kendal.  Postoperative course uncomplicated except for relatively difficult to achieve analgesia.    However she seems to be progressing.  AKI resolved.  Mentation good. - Postop care per orthopedics - NWB RUE - Okay for gentle pendulums - Change dressing as needed - No showering - RUE incision may get wet starting 9/7 - Sling RUE for comfort - Aspirin  81 daily for DVT prophylaxis for 1 month, then resume 3 times weekly     Hypertension Controlled on home medications.   AKI Resolved, lisinopril  and HCTZ resumed at discharge.   Paroxysmal atrial fibrillation History of complete heart block with pacemaker No arrhythmias noted here.   Chronic diastolic congestive heart failure No evidence of fluid overload at this time   Hyperlipidemia Stable on  Lipitor    Dementia Stable on Abilify , donepezil , and duloxetine               The [place]  Controlled Substances Registry was reviewed for this patient prior to discharge.  Consultants: Orthopedics Dr. Kendal Procedures performed: ORIF right humerus  Disposition: Skilled nursing facility [facility] Diet recommendation:  Cardiac diet  DISCHARGE MEDICATION: Allergies as of 05/15/2024   No Known Allergies      Medication List     PAUSE taking these medications    aspirin  EC 81 MG tablet Wait to take this until your doctor or other care provider tells you to start again. Take 1 tablet (81 mg total) by mouth daily. What changed: when to take this       STOP taking these medications    traMADol  50 MG tablet Commonly known as: ULTRAM        TAKE these medications    Accu-Chek SmartView test strip Generic drug: glucose blood Use to check blood sugar once daily   acetaminophen  325 MG tablet Commonly known as: TYLENOL  Take 2 tablets (650 mg total) by mouth every 6 (six) hours as needed.   ARIPiprazole  2 MG tablet Commonly known as: ABILIFY  Take 2 mg by mouth daily.   atorvastatin  80 MG tablet Commonly known as: LIPITOR  TAKE 1 TABLET BY MOUTH ONCE  DAILY   bisacodyl  10 MG suppository Commonly known as: DULCOLAX Place 1 suppository (10 mg total) rectally daily as needed for moderate constipation.   cholecalciferol  25 MCG (1000 UNIT) tablet Commonly  known as: VITAMIN D3 Take 1,000 Units by mouth daily.   docusate sodium  100 MG capsule Commonly known as: COLACE Take 1 capsule (100 mg total) by mouth 2 (two) times daily.   donepezil  10 MG tablet Commonly known as: ARICEPT  TAKE 1 TABLET BY MOUTH EVERY  MORNING   DULoxetine  60 MG capsule Commonly known as: CYMBALTA  TAKE 1 CAPSULE BY MOUTH DAILY   gabapentin  300 MG capsule Commonly known as: NEURONTIN  Take 2 capsules (600 mg total) by mouth 2 (two) times daily. And 900 mg at bedtime    lisinopril -hydrochlorothiazide  20-12.5 MG tablet Commonly known as: ZESTORETIC  TAKE 2 TABLETS BY MOUTH DAILY   metFORMIN  500 MG 24 hr tablet Commonly known as: GLUCOPHAGE -XR Take 1 tablet (500 mg total) by mouth daily with breakfast.   methocarbamol  500 MG tablet Commonly known as: ROBAXIN  Take 1 tablet (500 mg total) by mouth every 6 (six) hours as needed for muscle spasms.   metoprolol  succinate 25 MG 24 hr tablet Commonly known as: TOPROL -XL Take 1 tablet (25 mg total) by mouth daily.   multivitamin with minerals tablet Take 1 tablet by mouth daily.   oxyCODONE  5 MG immediate release tablet Commonly known as: Oxy IR/ROXICODONE  Take 0.5-1 tablets (2.5-5 mg total) by mouth every 4 (four) hours as needed (2.5 mg for pain score 4-6, 5 mg for pain score 7-10).   polyethylene glycol 17 g packet Commonly known as: MIRALAX  / GLYCOLAX  Take 17 g by mouth daily. Start taking on: May 16, 2024   senna 8.6 MG Tabs tablet Commonly known as: SENOKOT Take 1 tablet (8.6 mg total) by mouth daily. Start taking on: May 16, 2024               Discharge Care Instructions  (From admission, onward)           Start     Ordered   05/15/24 0000  Discharge wound care:       Comments: Leave dressing in place until orthopedics follow up   05/15/24 1021            Contact information for follow-up providers     Haddix, Franky SQUIBB, MD. Schedule an appointment as soon as possible for a visit in 2 week(s).   Specialty: Orthopedic Surgery Why: for wound check and repeat x-rays Contact information: [address] [place] [address] [phone]              Contact information for after-discharge care     Destination     [facility] .   Service: Skilled Nursing Contact information: [address] [place]  [phone]                     Discharge Instructions     Discharge wound  care:   Complete by: As directed    Leave dressing in place until orthopedics follow up   Increase activity slowly   Complete by: As directed        Discharge Exam: Filed Weights   05/13/24 0500 05/14/24 0500 05/15/24 0459  Weight: 98.6 kg 99.6 kg 100.9 kg   BP 137/67   Pulse 76   Temp 98.1 F (36.7 C)   Resp 20   Wt 100.9 kg   SpO2 97%   BMI 43.44 kg/m   General: Pt is awake, not in acute distress, slightly tired and sleepy Cardiovascular: RRR, nl S1-S2, no murmurs appreciated.   No LE  edema.  Chronic hyperemia and venous stasis change to both lower legs Respiratory: Normal respiratory rate and rhythm.  CTAB without rales or wheezes. Abdominal: Abdomen soft and non-tender.  No distension or HSM.   Neuro/Psych: Strength symmetric in upper and lower extremities.  Oriented x3, memory seems slightly impaired but overall judgment and insight appear at baseline.   Condition at discharge: fair  The results of significant diagnostics from this hospitalization (including imaging, microbiology, ancillary and laboratory) are listed below for reference.   Imaging Studies: DG Shoulder Right Result Date: 05/11/2024 CLINICAL DATA:  Fracture, postop. EXAM: RIGHT SHOULDER - 2+ VIEW COMPARISON:  Preoperative imaging. FINDINGS: Reduction of previous glenohumeral dislocation. Plate and screw fixation of displaced proximal humeral fracture. Residual displacement of a greater tuberosity fragment. Recent postsurgical change includes air and edema in the soft tissues. IMPRESSION: Plate and screw fixation of displaced proximal humeral fracture. Electronically Signed   By: Andrea Gasman M.D.   On: 05/11/2024 19:28   DG Humerus Right Result Date: 05/11/2024 CLINICAL DATA:  Elective surgery. EXAM: RIGHT HUMERUS - 2+ VIEW COMPARISON:  Preoperative imaging. FINDINGS: By fluoroscopic spot view of the right proximal humerus submitted from the operating room. Reduction of glenohumeral dislocation. Plate and  screw fixation of proximal humeral fracture. Fluoroscopy time 1 minutes 9 seconds. Dose 11.05 mGy. IMPRESSION: Intraoperative fluoroscopy during proximal humeral fracture ORIF. Electronically Signed   By: Andrea Gasman M.D.   On: 05/11/2024 19:27   DG C-Arm 1-60 Min-No Report Result Date: 05/11/2024 Fluoroscopy was utilized by the requesting physician.  No radiographic interpretation.   DG C-Arm 1-60 Min-No Report Result Date: 05/11/2024 Fluoroscopy was utilized by the requesting physician.  No radiographic interpretation.   DG Shoulder Right Result Date: 05/10/2024 CLINICAL DATA:  Elective surgery. EXAM: RIGHT SHOULDER - 2+ VIEW COMPARISON:  Radiograph earlier today FINDINGS: Single fluoroscopic spot view of the shoulder submitted from the operating room. The humeral head is dislocated with respect to the glenoid. Displaced proximal humeral fracture. Fluoroscopy time 36 seconds. Dose 5.4 mGy. IMPRESSION: Fluoroscopic spot view of the shoulder from the operating room. Electronically Signed   By: Andrea Gasman M.D.   On: 05/10/2024 19:25   DG C-Arm 1-60 Min-No Report Result Date: 05/10/2024 Fluoroscopy was utilized by the requesting physician.  No radiographic interpretation.   CT Shoulder Right Wo Contrast Result Date: 05/10/2024 CLINICAL DATA:  Anterior glenohumeral dislocation. EXAM: CT OF THE UPPER RIGHT EXTREMITY WITHOUT CONTRAST TECHNIQUE: Multidetector CT imaging of the upper right extremity was performed according to the standard protocol. RADIATION DOSE REDUCTION: This exam was performed according to the departmental dose-optimization program which includes automated exposure control, adjustment of the mA and/or kV according to patient size and/or use of iterative reconstruction technique. COMPARISON:  Same day radiographs of the right shoulder dated 05/10/2024. FINDINGS: Bones/Joint/Cartilage Anterior, inferior, and medial dislocation of the right humeral head with respect to the glenoid.  Associated large Hill-Sachs fracture at the posterolateral humeral head with comminuted greater tuberosity fracture demonstrating approximately 12 mm of lateral and 15-20 mm of posterior displacement. The posterior aspect of the anteriorly dislocated humeral head is perched on the anterior scapular neck, just below and medial to the level of the anterior inferior glenoid. The glenoid otherwise appears intact. Glenohumeral joint effusion with moderate-to-large volume lipohemarthrosis. The acromioclavicular joint appears anatomically aligned with mild degenerative arthropathy. Ligaments Ligaments are suboptimally evaluated by CT. Muscles and Tendons Soft tissue and intramuscular edema of the right rotator cuff musculature. Soft tissue Soft tissue  edema surrounding the right shoulder and chest wall. IMPRESSION: 1. Anterior, inferior, and medial dislocation of the right humeral head with respect to the glenoid. Associated large Hill-Sachs fracture of the posterolateral humeral head with displaced comminuted right greater tuberosity fragment. The posterior aspect of the anteriorly and inferiorly dislocated humeral head is perched on the anterior scapular neck, just below and medial to the level of the anterior inferior glenoid. The glenoid otherwise appears intact. 2. Moderate to large volume glenohumeral and subacromial/subdeltoid lipohemarthrosis with associated soft tissue and intramuscular edema of the right rotator cuff musculature. Electronically Signed   By: Harrietta Sherry M.D.   On: 05/10/2024 16:00   DG Chest 1 View Result Date: 05/10/2024 CLINICAL DATA:  Shoulder dislocation, hypoxia EXAM: CHEST  1 VIEW COMPARISON:  08/29/2022 FINDINGS: The right humeral head is dislocated anteriorly and inferiorly. Large greater tuberosity fragment observed lateral to the lower glenoid. Low lung volumes are present, causing crowding of the pulmonary vasculature. Leadless pacer noted. Mild atelectasis at the lung bases.  IMPRESSION: 1. Anterior inferior dislocation of the right humeral head. 2. Large right greater tuberosity fragment lateral to the lower glenoid. 3. Low lung volumes with mild atelectasis at the lung bases. Electronically Signed   By: Ryan Salvage M.D.   On: 05/10/2024 13:39   DG Shoulder Right Portable Result Date: 05/10/2024 EXAM: 1 VIEW XRAY OF THE RIGHT SHOULDER 05/10/2024 12:51:00 PM COMPARISON: Earlier today at 12:22 pm CLINICAL HISTORY: Shoulder reduction. Shoulder reduction #2; New hypoxia. FINDINGS: BONES AND JOINTS: Displaced greater tuberosity fracture fragment with Hill-Sachs lesion. Anterior dislocation of humeral head with respect to glenoid. SOFT TISSUES: No abnormal calcifications. Visualized lung is unremarkable. IMPRESSION: 1. Anterior dislocation of the right humeral head with respect to the glenoid. 2. Displaced greater tuberosity fracture fragment with Hill-Sachs lesion. 3. No significant change since earlier today. Electronically signed by: Rockey Kilts MD 05/10/2024 01:36 PM EDT RP Workstation: [id number]   DG Shoulder Right Portable Result Date: 05/10/2024 CLINICAL DATA:  Post reduction first attempt EXAM: RIGHT SHOULDER - 1 VIEW COMPARISON:  None Available. FINDINGS: Anterior dislocation of the shoulder. Avulsion of the greater tuberosity. IMPRESSION: Persistent anterior shoulder dislocation with humeral head avulsion. Electronically Signed   By: Jackquline Boxer M.D.   On: 05/10/2024 13:31   DG Humerus Right Result Date: 05/10/2024 CLINICAL DATA:  Shoulder pain, fall EXAM: RIGHT HUMERUS - 2+ VIEW COMPARISON:  None Available. FINDINGS: Anterior subcoracoid humeral dislocation at the glenohumeral joint with adjacent osseous fragment likely representing displaced humeral head fracture fragment. IMPRESSION: Anterior humeral dislocation at the glenohumeral joint and likely humeral head fracture. Electronically Signed   By: Michaeline Blanch M.D.   On: 05/10/2024 10:50   CT Head Wo  Contrast Result Date: 05/10/2024 CLINICAL DATA:  Provided history: Fall from standing.  Dizziness. EXAM: CT HEAD WITHOUT CONTRAST CT CERVICAL SPINE WITHOUT CONTRAST TECHNIQUE: Multidetector CT imaging of the head and cervical spine was performed following the standard protocol without intravenous contrast. Multiplanar CT image reconstructions of the cervical spine were also generated. RADIATION DOSE REDUCTION: This exam was performed according to the departmental dose-optimization program which includes automated exposure control, adjustment of the mA and/or kV according to patient size and/or use of iterative reconstruction technique. COMPARISON:  Brain MRI 01/05/2023. FINDINGS: CT HEAD FINDINGS Brain: Generalized cerebral atrophy. Moderate patchy and ill-defined hypoattenuation within the cerebral white matter and within/bout the basal ganglia, nonspecific but compatible with chronic small vessel ischemic disease. Known 11 mm pituitary cyst, as was demonstrated  on the prior brain MRI of 01/05/2023. No appreciable mass effect upon, or invasion of, regional structures. There is no acute intracranial hemorrhage. No demarcated cortical infarct. No extra-axial fluid collection. No evidence of an intracranial mass. No midline shift. Vascular: No hyperdense vessel.  Atherosclerotic calcifications. Skull: No calvarial fracture or aggressive osseous lesion. Sinuses/Orbits: No mass or acute finding within the imaged orbits. Trace mucosal thickening within the right maxillary sinus. Severe right anterior ethmoid and right frontal sinusitis. CT CERVICAL SPINE FINDINGS Alignment: Non severe bursal the expected cervical lordosis. 3 mm C2-C3 grade 1 anterolisthesis. 4 mm C3-C4 grade 1 retrolisthesis. 3 mm C4-C5 grade 1 anterolisthesis. 3 mm C7-T1 grade 1 anterolisthesis. Skull base and vertebrae: The basion-dental and atlanto-dental intervals are maintained.No evidence of acute fracture to the cervical spine. Soft tissues and  spinal canal: No prevertebral fluid or swelling. No visible canal hematoma. Disc levels: Cervical spondylosis with multilevel disc space narrowing, disc bulges, posterior disc osteophyte complexes, endplate spurring, uncovertebral hypertrophy and facet arthropathy. Disc space narrowing is greatest at C3-C4, C4-C5, C5-C6, C6-C7 and T1-T2 (advanced at these levels). Facet ankylosis bilaterally at C3-C4. Degenerative changes also present at the C1-C2 articulation (including degenerative cystic changes within the dens). Multilevel ventral osteophytes. Upper chest: No consolidation within the imaged lung apices. No visible pneumothorax. IMPRESSION: CT head: 1.  No evidence of an acute intracranial abnormality. 2. Parenchymal atrophy and chronic small vessel ischemic disease. 3. Known 11 mm pituitary cyst. No appreciable mass effect upon, or invasion of, regional structures. No further imaging evaluation is necessary. This follows ACR consensus guidelines: Management of Incidental Pituitary Findings on CT, MRI and F18-FDG PET: A White Paper of the ACR Incidental Findings Committee. J Am Coll Radiol 2018; 15: 966-72. 4. Paranasal sinus disease as described. CT cervical spine: 1. No evidence of an acute cervical spine fracture. 2. Nonspecific reversal of the expected cervical lordosis. 3. Grade 1 anterolisthesis at C2-C3, C3-C4, C4-C5 and C7-T1. Electronically Signed   By: Rockey Childs D.O.   On: 05/10/2024 10:44   CT Cervical Spine Wo Contrast Result Date: 05/10/2024 CLINICAL DATA:  Provided history: Fall from standing.  Dizziness. EXAM: CT HEAD WITHOUT CONTRAST CT CERVICAL SPINE WITHOUT CONTRAST TECHNIQUE: Multidetector CT imaging of the head and cervical spine was performed following the standard protocol without intravenous contrast. Multiplanar CT image reconstructions of the cervical spine were also generated. RADIATION DOSE REDUCTION: This exam was performed according to the departmental dose-optimization program  which includes automated exposure control, adjustment of the mA and/or kV according to patient size and/or use of iterative reconstruction technique. COMPARISON:  Brain MRI 01/05/2023. FINDINGS: CT HEAD FINDINGS Brain: Generalized cerebral atrophy. Moderate patchy and ill-defined hypoattenuation within the cerebral white matter and within/bout the basal ganglia, nonspecific but compatible with chronic small vessel ischemic disease. Known 11 mm pituitary cyst, as was demonstrated on the prior brain MRI of 01/05/2023. No appreciable mass effect upon, or invasion of, regional structures. There is no acute intracranial hemorrhage. No demarcated cortical infarct. No extra-axial fluid collection. No evidence of an intracranial mass. No midline shift. Vascular: No hyperdense vessel.  Atherosclerotic calcifications. Skull: No calvarial fracture or aggressive osseous lesion. Sinuses/Orbits: No mass or acute finding within the imaged orbits. Trace mucosal thickening within the right maxillary sinus. Severe right anterior ethmoid and right frontal sinusitis. CT CERVICAL SPINE FINDINGS Alignment: Non severe bursal the expected cervical lordosis. 3 mm C2-C3 grade 1 anterolisthesis. 4 mm C3-C4 grade 1 retrolisthesis. 3 mm C4-C5 grade 1 anterolisthesis.  3 mm C7-T1 grade 1 anterolisthesis. Skull base and vertebrae: The basion-dental and atlanto-dental intervals are maintained.No evidence of acute fracture to the cervical spine. Soft tissues and spinal canal: No prevertebral fluid or swelling. No visible canal hematoma. Disc levels: Cervical spondylosis with multilevel disc space narrowing, disc bulges, posterior disc osteophyte complexes, endplate spurring, uncovertebral hypertrophy and facet arthropathy. Disc space narrowing is greatest at C3-C4, C4-C5, C5-C6, C6-C7 and T1-T2 (advanced at these levels). Facet ankylosis bilaterally at C3-C4. Degenerative changes also present at the C1-C2 articulation (including degenerative cystic  changes within the dens). Multilevel ventral osteophytes. Upper chest: No consolidation within the imaged lung apices. No visible pneumothorax. IMPRESSION: CT head: 1.  No evidence of an acute intracranial abnormality. 2. Parenchymal atrophy and chronic small vessel ischemic disease. 3. Known 11 mm pituitary cyst. No appreciable mass effect upon, or invasion of, regional structures. No further imaging evaluation is necessary. This follows ACR consensus guidelines: Management of Incidental Pituitary Findings on CT, MRI and F18-FDG PET: A White Paper of the ACR Incidental Findings Committee. J Am Coll Radiol 2018; 15: 966-72. 4. Paranasal sinus disease as described. CT cervical spine: 1. No evidence of an acute cervical spine fracture. 2. Nonspecific reversal of the expected cervical lordosis. 3. Grade 1 anterolisthesis at C2-C3, C3-C4, C4-C5 and C7-T1. Electronically Signed   By: Rockey Childs D.O.   On: 05/10/2024 10:44    Microbiology: Results for orders placed or performed during the hospital encounter of 05/11/24  Surgical pcr screen     Status: None   Collection Time: 05/11/24 12:07 PM   Specimen: Nasal Mucosa; Nasal Swab  Result Value Ref Range Status   MRSA, PCR NEGATIVE NEGATIVE Final   Staphylococcus aureus NEGATIVE NEGATIVE Final    Comment: (NOTE) The Xpert SA Assay (FDA approved for NASAL specimens in patients 70 years of age and older), is one component of a comprehensive surveillance program. It is not intended to diagnose infection nor to guide or monitor treatment. Performed at [facility], [address] [place], [place] [address]    *Note: Due to a large number of results and/or encounters for the requested time period, some results have not been displayed. A complete set of results can be found in Results Review.    Labs: CBC: Recent Labs  Lab 05/10/24 0933 05/10/24 1520 05/11/24 0304 05/12/24 0734 05/13/24 0358 05/14/24 1026  WBC 14.2* 19.6* 12.4* 11.8*  11.5* 11.9*  NEUTROABS 11.5*  --   --   --   --   --   HGB 15.7* 14.8 13.6 12.6 11.1* 11.2*  HCT 49.3* 45.7 43.1 39.5 33.9* 35.2*  MCV 101.0* 99.8 101.7* 101.5* 99.4 101.1*  PLT 267 271 264 201 206 265   Basic Metabolic Panel: Recent Labs  Lab 05/10/24 1032 05/10/24 1520 05/11/24 0304 05/12/24 1059 05/14/24 0647  NA 139  --  140 141 138  K 4.3  --  4.6 4.4 4.1  CL 101  --  100 104 102  CO2 26  --  27 20* 25  GLUCOSE 155*  --  167* 170* 155*  BUN 31*  --  34* 29* 20  CREATININE 1.04* 0.86 1.55* 1.31* 0.80  CALCIUM  8.9  --  8.3* 8.3* 8.2*   Liver Function Tests: Recent Labs  Lab 05/11/24 0304  AST 50*  ALT 24  ALKPHOS 57  BILITOT 0.9  PROT 5.9*  ALBUMIN  3.3*   CBG: Recent Labs  Lab 05/11/24 0914 05/11/24 1106 05/11/24 1259  05/11/24 1454 05/11/24 1856  GLUCAP 133* 139* 134* 118* 146*    Discharge time spent: approximately 45 minutes spent on discharge counseling, evaluation of patient on day of discharge, and coordination of discharge planning with nursing, social work, pharmacy and case management  Signed: Lonni SHAUNNA Dalton, MD [facility] 05/15/2024

## 2024-05-15 NOTE — TOC Progression Note (Signed)
 Transition of Care [facility]) - Progression Note    Patient Details  Name: [name] MRN: [id number] Date of Birth: 1945-12-01  [facility] of [facility]) CM/SW Contact  Cena Ligas, [place] Phone Number: 05/15/2024, 10:26 AM  Clinical Narrative:     Patients insurance shara is pending. CSW will continue to follow for updates.   Expected Discharge Plan: Skilled Nursing Facility Barriers to Discharge: Continued Medical Work up, SNF Pending bed offer               Expected Discharge Plan and Services In-house Referral: Clinical Social Work   Post Acute Care Choice: Skilled Nursing Facility Living arrangements for the past 2 months: Single Family Home Expected Discharge Date: 05/15/24                                     Social Drivers of Health (SDOH) Interventions SDOH Screenings   Food Insecurity: No Food Insecurity (05/11/2024)  Housing: Low Risk  (05/11/2024)  Transportation Needs: No Transportation Needs (05/11/2024)  Utilities: Not At Risk (05/11/2024)  Depression (PHQ2-9): Low Risk  (01/01/2024)  Financial Resource Strain: Low Risk  (12/15/2022)  Physical Activity: Unknown (12/15/2022)  Social Connections: Socially Isolated (05/11/2024)  Stress: No Stress Concern Present (12/15/2022)  Tobacco Use: Low Risk  (05/11/2024)    Readmission Risk Interventions    12/05/2021    3:32 PM  Readmission Risk Prevention Plan  Transportation Screening Complete  PCP or Specialist Appt within 5-7 Days Complete  Home Care Screening Complete  Medication Review (RN CM) Complete

## 2024-05-16 DIAGNOSIS — Z950 Presence of cardiac pacemaker: Secondary | ICD-10-CM | POA: Diagnosis not present

## 2024-05-16 DIAGNOSIS — Z7401 Bed confinement status: Secondary | ICD-10-CM | POA: Diagnosis not present

## 2024-05-16 DIAGNOSIS — R2689 Other abnormalities of gait and mobility: Secondary | ICD-10-CM | POA: Diagnosis not present

## 2024-05-16 DIAGNOSIS — I499 Cardiac arrhythmia, unspecified: Secondary | ICD-10-CM | POA: Diagnosis not present

## 2024-05-16 DIAGNOSIS — I10 Essential (primary) hypertension: Secondary | ICD-10-CM | POA: Diagnosis not present

## 2024-05-16 DIAGNOSIS — R262 Difficulty in walking, not elsewhere classified: Secondary | ICD-10-CM | POA: Diagnosis not present

## 2024-05-16 DIAGNOSIS — E119 Type 2 diabetes mellitus without complications: Secondary | ICD-10-CM | POA: Diagnosis not present

## 2024-05-16 DIAGNOSIS — S42301A Unspecified fracture of shaft of humerus, right arm, initial encounter for closed fracture: Secondary | ICD-10-CM | POA: Diagnosis not present

## 2024-05-16 DIAGNOSIS — Z79899 Other long term (current) drug therapy: Secondary | ICD-10-CM | POA: Diagnosis not present

## 2024-05-16 DIAGNOSIS — S43014A Anterior dislocation of right humerus, initial encounter: Secondary | ICD-10-CM | POA: Diagnosis not present

## 2024-05-16 DIAGNOSIS — S42201D Unspecified fracture of upper end of right humerus, subsequent encounter for fracture with routine healing: Secondary | ICD-10-CM | POA: Diagnosis not present

## 2024-05-16 DIAGNOSIS — Z7982 Long term (current) use of aspirin: Secondary | ICD-10-CM | POA: Diagnosis not present

## 2024-05-16 DIAGNOSIS — M48061 Spinal stenosis, lumbar region without neurogenic claudication: Secondary | ICD-10-CM | POA: Diagnosis not present

## 2024-05-16 DIAGNOSIS — M25511 Pain in right shoulder: Secondary | ICD-10-CM | POA: Diagnosis not present

## 2024-05-16 DIAGNOSIS — I110 Hypertensive heart disease with heart failure: Secondary | ICD-10-CM | POA: Diagnosis not present

## 2024-05-16 DIAGNOSIS — M6281 Muscle weakness (generalized): Secondary | ICD-10-CM | POA: Diagnosis not present

## 2024-05-16 DIAGNOSIS — E1122 Type 2 diabetes mellitus with diabetic chronic kidney disease: Secondary | ICD-10-CM | POA: Diagnosis not present

## 2024-05-16 DIAGNOSIS — N179 Acute kidney failure, unspecified: Secondary | ICD-10-CM | POA: Diagnosis not present

## 2024-05-16 DIAGNOSIS — I480 Paroxysmal atrial fibrillation: Secondary | ICD-10-CM | POA: Diagnosis not present

## 2024-05-16 DIAGNOSIS — Z7984 Long term (current) use of oral hypoglycemic drugs: Secondary | ICD-10-CM | POA: Diagnosis not present

## 2024-05-16 DIAGNOSIS — R531 Weakness: Secondary | ICD-10-CM | POA: Diagnosis not present

## 2024-05-16 DIAGNOSIS — I442 Atrioventricular block, complete: Secondary | ICD-10-CM | POA: Diagnosis not present

## 2024-05-16 DIAGNOSIS — Z741 Need for assistance with personal care: Secondary | ICD-10-CM | POA: Diagnosis not present

## 2024-05-16 DIAGNOSIS — N1831 Chronic kidney disease, stage 3a: Secondary | ICD-10-CM | POA: Diagnosis not present

## 2024-05-16 DIAGNOSIS — E7849 Other hyperlipidemia: Secondary | ICD-10-CM | POA: Diagnosis not present

## 2024-05-16 DIAGNOSIS — S42201A Unspecified fracture of upper end of right humerus, initial encounter for closed fracture: Secondary | ICD-10-CM | POA: Diagnosis not present

## 2024-05-16 DIAGNOSIS — Z9181 History of falling: Secondary | ICD-10-CM | POA: Diagnosis not present

## 2024-05-16 DIAGNOSIS — I130 Hypertensive heart and chronic kidney disease with heart failure and stage 1 through stage 4 chronic kidney disease, or unspecified chronic kidney disease: Secondary | ICD-10-CM | POA: Diagnosis not present

## 2024-05-16 DIAGNOSIS — Z96611 Presence of right artificial shoulder joint: Secondary | ICD-10-CM | POA: Diagnosis not present

## 2024-05-16 DIAGNOSIS — Z743 Need for continuous supervision: Secondary | ICD-10-CM | POA: Diagnosis not present

## 2024-05-16 DIAGNOSIS — I5032 Chronic diastolic (congestive) heart failure: Secondary | ICD-10-CM | POA: Diagnosis not present

## 2024-05-16 DIAGNOSIS — S43014D Anterior dislocation of right humerus, subsequent encounter: Secondary | ICD-10-CM | POA: Diagnosis not present

## 2024-05-16 LAB — CBC
HCT: 34.1 % — ABNORMAL LOW (ref 36.0–46.0)
Hemoglobin: 10.7 g/dL — ABNORMAL LOW (ref 12.0–15.0)
MCH: 31.9 pg (ref 26.0–34.0)
MCHC: 31.4 g/dL (ref 30.0–36.0)
MCV: 101.8 fL — ABNORMAL HIGH (ref 80.0–100.0)
Platelets: 301 K/uL (ref 150–400)
RBC: 3.35 MIL/uL — ABNORMAL LOW (ref 3.87–5.11)
RDW: 13.6 % (ref 11.5–15.5)
WBC: 12.1 K/uL — ABNORMAL HIGH (ref 4.0–10.5)
nRBC: 0 % (ref 0.0–0.2)

## 2024-05-16 LAB — BASIC METABOLIC PANEL WITH GFR
Anion gap: 13 (ref 5–15)
BUN: 19 mg/dL (ref 8–23)
CO2: 23 mmol/L (ref 22–32)
Calcium: 8.2 mg/dL — ABNORMAL LOW (ref 8.9–10.3)
Chloride: 102 mmol/L (ref 98–111)
Creatinine, Ser: 0.9 mg/dL (ref 0.44–1.00)
GFR, Estimated: >60 mL/min (ref >60)
Glucose, Bld: 166 mg/dL — ABNORMAL HIGH (ref 70–99)
Potassium: 4.5 mmol/L (ref 3.5–5.1)
Sodium: 138 mmol/L (ref 135–145)

## 2024-05-16 NOTE — TOC Progression Note (Addendum)
 Transition of Care [facility]) - Progression Note    Patient Details  Name: [name] MRN: [id number] Date of Birth: 26-Jun-1946  Transition of Care [facility]) CM/SW Contact  Bridget Cordella Simmonds, LCSW Phone Number: 05/16/2024, 8:41 AM  Clinical Narrative:   SNF auth request remains pending in [place].   1120: SNF auth remains pending.   1300: SNF auth approved: [id number], 3 days: 9/8-9/10.   CSW confirmed with [facility] they can receive pt today.  MD notified.   Expected Discharge Plan: Skilled Nursing Facility Barriers to Discharge: Continued Medical Work up, SNF Pending bed offer               Expected Discharge Plan and Services In-house Referral: Clinical Social Work   Post Acute Care Choice: Skilled Nursing Facility Living arrangements for the past 2 months: Single Family Home Expected Discharge Date: 05/15/24                                     Social Drivers of Health (SDOH) Interventions SDOH Screenings   Food Insecurity: No Food Insecurity (05/11/2024)  Housing: Low Risk  (05/11/2024)  Transportation Needs: No Transportation Needs (05/11/2024)  Utilities: Not At Risk (05/11/2024)  Depression (PHQ2-9): Low Risk  (01/01/2024)  Financial Resource Strain: Low Risk  (12/15/2022)  Physical Activity: Unknown (12/15/2022)  Social Connections: Socially Isolated (05/11/2024)  Stress: No Stress Concern Present (12/15/2022)  Tobacco Use: Low Risk  (05/11/2024)    Readmission Risk Interventions    12/05/2021    3:32 PM  Readmission Risk Prevention Plan  Transportation Screening Complete  PCP or Specialist Appt within 5-7 Days Complete  Home Care Screening Complete  Medication Review (RN CM) Complete

## 2024-05-16 NOTE — Progress Notes (Signed)
 Occupational Therapy Treatment Patient Details Name: [name] MRN: [id number] DOB: [birth year] Today's Date: 05/16/2024   History of present illness Pt is a 78 y/o female who presents 05/10/2024 s/p episode of dizziness in kitchen and fall onto R shoulder. She sustained a closed fracture dislocation of the right shoulder. Pt then sustained a proximal humerus fx with attempts at closed reduction in the OR and is now s/p ORIF on 05/11/2024. PMH significant for DM, hepatitis, HTN, memory loss, LE N/T, back surgery, pacemaker, L THA, L3-5 TLIF 2017.   OT comments  Pt seen in collaboration with PT for safety with OOB mobility this session. Pt needing up to mod A +2 for all basic mobility; able to tolerate sitting EOB this session with CGA-supervision for short bouts once positioned with Les on floor. Pt needing up to max A for sling repositioning as well. Engaged in UE digit flexion/extension, wrist flexion/extension, pronation/supination, and elbow flexion/extension to reduce edema and promote functional use with cues. Will continue to follow. Patient will benefit from continued inpatient follow up therapy, <3 hours/day       If plan is discharge home, recommend the following:  Two people to help with walking and/or transfers;Two people to help with bathing/dressing/bathroom;Assistance with cooking/housework;Direct supervision/assist for medications management;Direct supervision/assist for financial management;Assist for transportation;Help with stairs or ramp for entrance;Supervision due to cognitive status   Equipment Recommendations  Other (comment) (defer)    Recommendations for Other Services      Precautions / Restrictions Precautions Precautions: Fall;Shoulder Type of Shoulder Precautions: Gentle pendulums starting POD 1, and limited active shoulder abduction and forward elevation for 1-2 weeks. Shoulder Interventions: Shoulder sling/immobilizer;For comfort Precaution Booklet Issued:  No Recall of Precautions/Restrictions: Impaired Required Braces or Orthoses: Sling Restrictions Weight Bearing Restrictions Per Provider Order: Yes RUE Weight Bearing Per Provider Order: Non weight bearing       Mobility Bed Mobility Overal bed mobility: Needs Assistance Bed Mobility: Rolling, Sidelying to Sit Rolling: Mod assist, +2 for physical assistance, +2 for safety/equipment, Used rails Sidelying to sit: Mod assist, +2 for physical assistance, +2 for safety/equipment, HOB elevated, Used rails       General bed mobility comments: +2 assist with use of rails and bed pad required for transition to EOB and to get feet on the floor.    Transfers Overall transfer level: Needs assistance Equipment used: 2 person hand held assist Transfers: Sit to/from Stand, Bed to chair/wheelchair/BSC Sit to Stand: Mod assist, +2 physical assistance, +2 safety/equipment     Step pivot transfers: Mod assist, +2 physical assistance, +2 safety/equipment     General transfer comment: Pt was able to power up to full stand and take pivotal steps around to the recliner with +2 assist. Fairly good control for stand>sit, and pt was able to reach back for the recliner prior to initiating.     Balance Overall balance assessment: Needs assistance Sitting-balance support: Feet supported, No upper extremity supported Sitting balance-Leahy Scale: Zero Sitting balance - Comments: Up to max assist required. Short bouts (<30) of min assist                                   ADL either performed or assessed with clinical judgement   ADL Overall ADL's : Needs assistance/impaired     Grooming: Minimal assistance;Sitting (EOB)               Lower Body Dressing:  Total assistance   Toilet Transfer: Moderate assistance;+2 for physical assistance;+2 for safety/equipment Toilet Transfer Details (indicate cue type and reason): for SPT                Extremity/Trunk Assessment  Upper Extremity Assessment Upper Extremity Assessment: Right hand dominant RUE Deficits / Details: able to perform composite flexion/extension, pronation, supination 0-5 degrees   Lower Extremity Assessment Lower Extremity Assessment: Defer to PT evaluation        Vision       Perception     Praxis     Communication Communication Communication: No apparent difficulties   Cognition Arousal: Alert Behavior During Therapy: Flat affect Cognition: History of cognitive impairments, No family/caregiver present to determine baseline, Cognition impaired   Orientation impairments:  (A&Ox4) Awareness: Intellectual awareness intact, Online awareness impaired Memory impairment (select all impairments): Short-term memory, Working memory Attention impairment (select first level of impairment): Selective attention Executive functioning impairment (select all impairments): Organization, Sequencing, Reasoning, Problem solving OT - Cognition Comments: AAOx4 with diagnosis of dementia at baseline. cues for problem solving in light of deficits                 Following commands: Impaired        Cueing   Cueing Techniques: Verbal cues, Gestural cues  Exercises      Shoulder Instructions       General Comments      Pertinent Vitals/ Pain       Pain Assessment Pain Assessment: Faces Faces Pain Scale: Hurts even more Pain Location: R shoulder, R hip, back Pain Descriptors / Indicators: Grimacing, Operative site guarding, Moaning Pain Intervention(s): Limited activity within patient's tolerance, Monitored during session  Home Living                                          Prior Functioning/Environment              Frequency  Min 2X/week        Progress Toward Goals  OT Goals(current goals can now be found in the care plan section)  Progress towards OT goals: Progressing toward goals  Acute Rehab OT Goals OT Goal Formulation: With  patient Time For Goal Achievement: 05/26/24 Potential to Achieve Goals: Fair  Plan      Co-evaluation    PT/OT/SLP Co-Evaluation/Treatment: Yes Reason for Co-Treatment: Complexity of the patient's impairments (multi-system involvement);Necessary to address cognition/behavior during functional activity;For patient/therapist safety;To address functional/ADL transfers PT goals addressed during session: Mobility/safety with mobility;Balance OT goals addressed during session: ADL's and self-care;Strengthening/ROM      AM-PAC OT 6 Clicks Daily Activity     Outcome Measure   Help from another person eating meals?: A Little Help from another person taking care of personal grooming?: A Lot Help from another person toileting, which includes using toliet, bedpan, or urinal?: Total Help from another person bathing (including washing, rinsing, drying)?: A Lot Help from another person to put on and taking off regular upper body clothing?: A Lot Help from another person to put on and taking off regular lower body clothing?: Total 6 Click Score: 11    End of Session Equipment Utilized During Treatment: Gait belt (R sling)  OT Visit Diagnosis: Unsteadiness on feet (R26.81);Other abnormalities of gait and mobility (R26.89);History of falling (Z91.81);Muscle weakness (generalized) (M62.81);Other symptoms and signs involving cognitive function;Pain   Activity Tolerance Patient  limited by pain   Patient Left in chair;with call bell/phone within reach (char alarm pad under pt but no green box in room or equipment room)   Nurse Communication Mobility status        Time: 8985-8956 OT Time Calculation (min): 29 min  Charges: OT General Charges $OT Visit: 1 Visit OT Treatments $Self Care/Home Management : 8-22 mins  [name], OTR/L [facility] Acute Rehabilitation Office: [phone]   [name] 05/16/2024, 1:35 PM

## 2024-05-16 NOTE — TOC Transition Note (Signed)
 Transition of Care [facility]) - Discharge Note   Patient Details  Name: [name] MRN: [id number] Date of Birth: [birth year]  [facility]) CM/SW Contact:  Bridget Cordella Simmonds, LCSW Phone Number: 05/16/2024, 2:04 PM   Clinical Narrative:   Pt Discharging to [facility], room 503A. RN report to: [phone].  PTAR called 1400.      Final next level of care: Skilled Nursing Facility Barriers to Discharge: Barriers Resolved   Patient Goals and CMS Choice   CMS Medicare.gov Compare Post Acute Care list provided to:: Patient Choice offered to / list presented to : Patient      Discharge Placement              Patient chooses bed at: [facility] Patient to be transferred to facility by: ptar Name of family member notified: left message with son [name] Patient and family notified of of transfer: 05/16/24  Discharge Plan and Services Additional resources added to the After Visit Summary for   In-house Referral: Clinical Social Work   Post Acute Care Choice: Skilled Nursing Facility                               Social Drivers of Health (SDOH) Interventions SDOH Screenings   Food Insecurity: No Food Insecurity (05/11/2024)  Housing: Low Risk  (05/11/2024)  Transportation Needs: No Transportation Needs (05/11/2024)  Utilities: Not At Risk (05/11/2024)  Depression (PHQ2-9): Low Risk  (01/01/2024)  Financial Resource Strain: Low Risk  (12/15/2022)  Physical Activity: Unknown (12/15/2022)  Social Connections: Socially Isolated (05/11/2024)  Stress: No Stress Concern Present (12/15/2022)  Tobacco Use: Low Risk  (05/11/2024)     Readmission Risk Interventions    12/05/2021    3:32 PM  Readmission Risk Prevention Plan  Transportation Screening Complete  PCP or Specialist Appt within 5-7 Days Complete  Home Care Screening Complete  Medication Review (RN CM) Complete

## 2024-05-16 NOTE — Progress Notes (Signed)
 Refusing SCD's, refusing care, stating leave me alone.

## 2024-05-16 NOTE — Progress Notes (Signed)
 Physical Therapy Treatment  Patient Details Name: [name] MRN: [id number] DOB: 07/17/1946 Today's Date: 05/16/2024   History of Present Illness Pt is a 78 y/o female who presents 05/10/2024 s/p episode of dizziness in kitchen and fall onto R shoulder. She sustained a closed fracture dislocation of the right shoulder. Pt then sustained a proximal humerus fx with attempts at closed reduction in the OR and is now s/p ORIF on 05/11/2024. PMH significant for DM, hepatitis, HTN, memory loss, LE N/T, back surgery, pacemaker, L THA, L3-5 TLIF 2017.    PT Comments  Pt progressing towards physical therapy goals. Pt was able to tolerate functional mobility with decreased reports of pain compared to evaluation. Pt was able to transition bed>chair with +2 mod assist and demonstrated the ability to take several controlled, pivotal steps to recliner. UE's positioned on pillows and LE's elevated in chair. Communication to NT regarding recommendation for use of Stedy for back to bed at end of session. Will continue to follow and progress as able per POC.     If plan is discharge home, recommend the following: Two people to help with walking and/or transfers;Two people to help with bathing/dressing/bathroom;Assistance with cooking/housework;Direct supervision/assist for medications management;Direct supervision/assist for financial management;Assistance with feeding;Assist for transportation;Help with stairs or ramp for entrance;Supervision due to cognitive status   Can travel by private vehicle     No  Equipment Recommendations  Other (comment) (TBD by next venue of care)    Recommendations for Other Services       Precautions / Restrictions Precautions Precautions: Fall;Shoulder Type of Shoulder Precautions: Gentle pendulums starting POD 1, and limited active shoulder abduction and forward elevation for 1-2 weeks. Shoulder Interventions: Shoulder sling/immobilizer;For comfort Precaution Booklet Issued:  No Recall of Precautions/Restrictions: Impaired Required Braces or Orthoses: Sling Restrictions Weight Bearing Restrictions Per Provider Order: Yes RUE Weight Bearing Per Provider Order: Non weight bearing     Mobility  Bed Mobility Overal bed mobility: Needs Assistance Bed Mobility: Rolling, Sidelying to Sit Rolling: Mod assist, +2 for physical assistance, +2 for safety/equipment, Used rails Sidelying to sit: Mod assist, +2 for physical assistance, +2 for safety/equipment, HOB elevated, Used rails       General bed mobility comments: +2 assist with use of rails and bed pad required for transition to EOB and to get feet on the floor.    Transfers Overall transfer level: Needs assistance Equipment used: 2 person hand held assist Transfers: Sit to/from Stand, Bed to chair/wheelchair/BSC Sit to Stand: Mod assist, +2 physical assistance, +2 safety/equipment   Step pivot transfers: Mod assist, +2 physical assistance, +2 safety/equipment       General transfer comment: Pt was able to power up to full stand and take pivotal steps around to the recliner with +2 assist. Fairly good control for stand>sit, and pt was able to reach back for the recliner prior to initiating.    Ambulation/Gait               General Gait Details: Unable to progress to gait training at this time.   Stairs             Wheelchair Mobility     Tilt Bed    Modified Rankin (Stroke Patients Only)       Balance Overall balance assessment: Needs assistance Sitting-balance support: Feet supported, No upper extremity supported Sitting balance-Leahy Scale: Zero Sitting balance - Comments: Up to max assist required. Short bouts (<30) of min assist  Communication Communication Communication: No apparent difficulties  Cognition Arousal: Alert Behavior During Therapy: Flat affect   PT - Cognitive impairments: History of cognitive  impairments                         Following commands: Impaired      Cueing Cueing Techniques: Verbal cues, Gestural cues  Exercises      General Comments        Pertinent Vitals/Pain Pain Assessment Pain Assessment: Faces Faces Pain Scale: Hurts even more Pain Location: R shoulder, R hip, back Pain Descriptors / Indicators: Grimacing, Operative site guarding, Moaning Pain Intervention(s): Limited activity within patient's tolerance, Monitored during session, Repositioned    Home Living                          Prior Function            PT Goals (current goals can now be found in the care plan section) Acute Rehab PT Goals Patient Stated Goal: Improve pain, be able to go home PT Goal Formulation: With patient Time For Goal Achievement: 05/19/24 Potential to Achieve Goals: Good Progress towards PT goals: Progressing toward goals    Frequency    Min 2X/week      PT Plan      Co-evaluation PT/OT/SLP Co-Evaluation/Treatment: Yes Reason for Co-Treatment: Complexity of the patient's impairments (multi-system involvement);Necessary to address cognition/behavior during functional activity;For patient/therapist safety;To address functional/ADL transfers PT goals addressed during session: Mobility/safety with mobility;Balance OT goals addressed during session: ADL's and self-care;Strengthening/ROM      AM-PAC PT 6 Clicks Mobility   Outcome Measure  Help needed turning from your back to your side while in a flat bed without using bedrails?: Total Help needed moving from lying on your back to sitting on the side of a flat bed without using bedrails?: Total Help needed moving to and from a bed to a chair (including a wheelchair)?: Total Help needed standing up from a chair using your arms (e.g., wheelchair or bedside chair)?: Total Help needed to walk in hospital room?: Total Help needed climbing 3-5 steps with a railing? : Total 6 Click  Score: 6    End of Session Equipment Utilized During Treatment: Gait belt;Oxygen (Pt on O2 throughout session. Pt in no apparent distress, SpO2 93-95% throughout session.) Activity Tolerance: Patient tolerated treatment well Patient left: in bed;with call bell/phone within reach;with bed alarm set Nurse Communication: Mobility status PT Visit Diagnosis: History of falling (Z91.81);Pain;Difficulty in walking, not elsewhere classified (R26.2) Pain - Right/Left: Right Pain - part of body: Shoulder     Time: 8985-8955 PT Time Calculation (min) (ACUTE ONLY): 30 min  Charges:    $Gait Training: 8-22 mins PT General Charges $$ ACUTE PT VISIT: 1 Visit                     Leita Sable, PT, DPT Acute Rehabilitation Services Secure Chat Preferred Office: [phone]    Leita JONETTA Sable 05/16/2024, 12:52 PM

## 2024-05-16 NOTE — Progress Notes (Signed)
 CSW spoke with pt regarding SDOH: food.  CSW inquired if there were problems with obtaining adequate food.  Pt responded, that's crazy.  Per pt, adequate food in the home, she and her son buy what they need. Cathlyn Ferry, MSW, LCSW 9/8/20252:15 PM

## 2024-05-16 NOTE — Discharge Summary (Signed)
 Physician Discharge Summary   Patient: [name] MRN: [id number] DOB: [birth year]  Admit date:     05/11/2024  Discharge date: 05/16/24  Discharge Physician: Lonni SHAUNNA Dalton   PCP: Jimmy Charlie FERNS, MD     Recommendations at discharge:  Follow up with Dr. Kendal for shoulder ORIF in 1 week Follow up with PCP Dr. Jimmy 1 week after hospital discharge     Discharge Diagnoses: Principal Problem:   Proximal fracture and anterior dislocation of right shoulder Active Problems:   Fall at home, initial encounter   Essential hypertension   AKI (acute kidney injury) (HCC)   (HFpEF) heart failure with preserved ejection fraction (HCC)   Controlled type 2 diabetes mellitus without complication, without long-term current use of insulin  (HCC)   Complete heart block (HCC)   Paroxysmal atrial fibrillation (HCC)   Hyperlipidemia      Hospital Course: 78 y.o. F with dementia, lives at home, CKD, DM, dCHF, CHB s/p PPM who presented with fall and right shoulder dislocation.      Proximal right humerus fracture Humerus dislocation S/p ORIF right humerus 9/3 by Dr. Kendal Admitted and taken to the OR on 9/3 by Dr. Kendal.  Postoperative course uncomplicated except for relatively difficult to achieve analgesia.    However she seems to be progressing.  AKI resolved.  Mentation good. - Postop care per orthopedics - NWB RUE - Okay for gentle pendulums - Change dressing as needed - No showering - RUE incision may get wet starting 9/7 - Sling RUE for comfort - Aspirin  81 daily for DVT prophylaxis for 1 month, then resume 3 times weekly     Hypertension Controlled on home medications.   AKI Resolved, lisinopril  and HCTZ resumed at discharge.   Paroxysmal atrial fibrillation History of complete heart block with pacemaker No arrhythmias noted here.   Chronic diastolic congestive heart failure No evidence of fluid overload at this time   Hyperlipidemia Stable on  Lipitor    Dementia Stable on Abilify , donepezil , and duloxetine               [place]  Controlled Substances Registry was reviewed for this patient prior to discharge.  Consultants: Orthopedics Dr. Kendal Procedures performed: ORIF right humerus  Disposition: Skilled nursing facility [facility] Diet recommendation:  Cardiac diet  DISCHARGE MEDICATION: Allergies as of 05/16/2024   No Known Allergies      Medication List     STOP taking these medications    traMADol  50 MG tablet Commonly known as: ULTRAM        TAKE these medications    Accu-Chek SmartView test strip Generic drug: glucose blood Use to check blood sugar once daily   acetaminophen  325 MG tablet Commonly known as: TYLENOL  Take 2 tablets (650 mg total) by mouth every 6 (six) hours as needed.   ARIPiprazole  2 MG tablet Commonly known as: ABILIFY  Take 2 mg by mouth daily.   aspirin  EC 81 MG tablet Take 1 tablet (81 mg total) by mouth daily.   atorvastatin  80 MG tablet Commonly known as: LIPITOR  TAKE 1 TABLET BY MOUTH ONCE  DAILY   bisacodyl  10 MG suppository Commonly known as: DULCOLAX Place 1 suppository (10 mg total) rectally daily as needed for moderate constipation.   cholecalciferol  25 MCG (1000 UNIT) tablet Commonly known as: VITAMIN D3 Take 1,000 Units by mouth daily.   docusate sodium  100 MG capsule Commonly known as: COLACE Take 1 capsule (100 mg total) by mouth 2 (two) times daily.  donepezil  10 MG tablet Commonly known as: ARICEPT  TAKE 1 TABLET BY MOUTH EVERY  MORNING   DULoxetine  60 MG capsule Commonly known as: CYMBALTA  TAKE 1 CAPSULE BY MOUTH DAILY   gabapentin  300 MG capsule Commonly known as: NEURONTIN  Take 2 capsules (600 mg total) by mouth 2 (two) times daily. And 900 mg at bedtime   lisinopril -hydrochlorothiazide  20-12.5 MG tablet Commonly known as: ZESTORETIC  TAKE 2 TABLETS BY MOUTH DAILY   metFORMIN  500 MG 24 hr tablet Commonly known as:  GLUCOPHAGE -XR Take 1 tablet (500 mg total) by mouth daily with breakfast.   methocarbamol  500 MG tablet Commonly known as: ROBAXIN  Take 1 tablet (500 mg total) by mouth every 6 (six) hours as needed for muscle spasms.   metoprolol  succinate 25 MG 24 hr tablet Commonly known as: TOPROL -XL Take 1 tablet (25 mg total) by mouth daily.   multivitamin with minerals tablet Take 1 tablet by mouth daily.   oxyCODONE  5 MG immediate release tablet Commonly known as: Oxy IR/ROXICODONE  Take 0.5-1 tablets (2.5-5 mg total) by mouth every 4 (four) hours as needed (2.5 mg for pain score 4-6, 5 mg for pain score 7-10).   polyethylene glycol 17 g packet Commonly known as: MIRALAX  / GLYCOLAX  Take 17 g by mouth daily.   senna 8.6 MG Tabs tablet Commonly known as: SENOKOT Take 1 tablet (8.6 mg total) by mouth daily.               Discharge Care Instructions  (From admission, onward)           Start     Ordered   05/15/24 0000  Discharge wound care:       Comments: Leave dressing in place until orthopedics follow up   05/15/24 1021            Contact information for follow-up providers     Haddix, Franky SQUIBB, MD. Schedule an appointment as soon as possible for a visit in 2 week(s).   Specialty: Orthopedic Surgery Why: for wound check and repeat x-rays Contact information: [address] [place] [address] [phone]              Contact information for after-discharge care     Destination     [facility] .   Service: Skilled Nursing Contact information: [address] [place]  [phone]                     Discharge Instructions     Discharge wound care:   Complete by: As directed    Leave dressing in place until orthopedics follow up   Increase activity slowly   Complete by: As directed        Discharge Exam: Filed Weights   05/13/24 0500 05/14/24 0500 05/15/24 0459   Weight: 98.6 kg 99.6 kg 100.9 kg   BP (!) 133/53 (BP Location: Left Arm)   Pulse 63   Temp 97.9 F (36.6 C)   Resp 18   Wt 100.9 kg   SpO2 99%   BMI 43.44 kg/m   General: Pt is awake, not in acute distress, interactive and appropriate Cardiovascular: RRR no murmurs, no LE edema Respiratory: Normal respiratory rate and rhythm.  CTAB without rales or wheezes. Abdominal: Abdomen soft and non-tender.  No distension or HSM.   Neuro/Psych: Strength symmetric in upper and lower extremities.  Oriented x3, memory seems slightly impaired but overall judgment and insight appear at baseline.  Condition at discharge: fair  The results of significant diagnostics from this hospitalization (including imaging, microbiology, ancillary and laboratory) are listed below for reference.   Imaging Studies: DG CHEST PORT 1 VIEW Result Date: 05/15/2024 CLINICAL DATA:  Hypoxia. EXAM: PORTABLE CHEST 1 VIEW COMPARISON:  May 10, 2024. FINDINGS: The heart size and mediastinal contours are within normal limits. Hypoinflation of the lungs with minimal bibasilar subsegmental atelectasis. The visualized skeletal structures are unremarkable. IMPRESSION: Hypoinflation of the lungs with minimal bibasilar subsegmental atelectasis. Electronically Signed   By: Lynwood Landy Raddle M.D.   On: 05/15/2024 17:14   DG Shoulder Right Result Date: 05/11/2024 CLINICAL DATA:  Fracture, postop. EXAM: RIGHT SHOULDER - 2+ VIEW COMPARISON:  Preoperative imaging. FINDINGS: Reduction of previous glenohumeral dislocation. Plate and screw fixation of displaced proximal humeral fracture. Residual displacement of a greater tuberosity fragment. Recent postsurgical change includes air and edema in the soft tissues. IMPRESSION: Plate and screw fixation of displaced proximal humeral fracture. Electronically Signed   By: Andrea Gasman M.D.   On: 05/11/2024 19:28   DG Humerus Right Result Date: 05/11/2024 CLINICAL DATA:  Elective surgery. EXAM:  RIGHT HUMERUS - 2+ VIEW COMPARISON:  Preoperative imaging. FINDINGS: By fluoroscopic spot view of the right proximal humerus submitted from the operating room. Reduction of glenohumeral dislocation. Plate and screw fixation of proximal humeral fracture. Fluoroscopy time 1 minutes 9 seconds. Dose 11.05 mGy. IMPRESSION: Intraoperative fluoroscopy during proximal humeral fracture ORIF. Electronically Signed   By: Andrea Gasman M.D.   On: 05/11/2024 19:27   DG C-Arm 1-60 Min-No Report Result Date: 05/11/2024 Fluoroscopy was utilized by the requesting physician.  No radiographic interpretation.   DG C-Arm 1-60 Min-No Report Result Date: 05/11/2024 Fluoroscopy was utilized by the requesting physician.  No radiographic interpretation.   DG Shoulder Right Result Date: 05/10/2024 CLINICAL DATA:  Elective surgery. EXAM: RIGHT SHOULDER - 2+ VIEW COMPARISON:  Radiograph earlier today FINDINGS: Single fluoroscopic spot view of the shoulder submitted from the operating room. The humeral head is dislocated with respect to the glenoid. Displaced proximal humeral fracture. Fluoroscopy time 36 seconds. Dose 5.4 mGy. IMPRESSION: Fluoroscopic spot view of the shoulder from the operating room. Electronically Signed   By: Andrea Gasman M.D.   On: 05/10/2024 19:25   DG C-Arm 1-60 Min-No Report Result Date: 05/10/2024 Fluoroscopy was utilized by the requesting physician.  No radiographic interpretation.   CT Shoulder Right Wo Contrast Result Date: 05/10/2024 CLINICAL DATA:  Anterior glenohumeral dislocation. EXAM: CT OF THE UPPER RIGHT EXTREMITY WITHOUT CONTRAST TECHNIQUE: Multidetector CT imaging of the upper right extremity was performed according to the standard protocol. RADIATION DOSE REDUCTION: This exam was performed according to the departmental dose-optimization program which includes automated exposure control, adjustment of the mA and/or kV according to patient size and/or use of iterative reconstruction  technique. COMPARISON:  Same day radiographs of the right shoulder dated 05/10/2024. FINDINGS: Bones/Joint/Cartilage Anterior, inferior, and medial dislocation of the right humeral head with respect to the glenoid. Associated large Hill-Sachs fracture at the posterolateral humeral head with comminuted greater tuberosity fracture demonstrating approximately 12 mm of lateral and 15-20 mm of posterior displacement. The posterior aspect of the anteriorly dislocated humeral head is perched on the anterior scapular neck, just below and medial to the level of the anterior inferior glenoid. The glenoid otherwise appears intact. Glenohumeral joint effusion with moderate-to-large volume lipohemarthrosis. The acromioclavicular joint appears anatomically aligned with mild degenerative arthropathy. Ligaments Ligaments are suboptimally evaluated by CT. Muscles and Tendons Soft  tissue and intramuscular edema of the right rotator cuff musculature. Soft tissue Soft tissue edema surrounding the right shoulder and chest wall. IMPRESSION: 1. Anterior, inferior, and medial dislocation of the right humeral head with respect to the glenoid. Associated large Hill-Sachs fracture of the posterolateral humeral head with displaced comminuted right greater tuberosity fragment. The posterior aspect of the anteriorly and inferiorly dislocated humeral head is perched on the anterior scapular neck, just below and medial to the level of the anterior inferior glenoid. The glenoid otherwise appears intact. 2. Moderate to large volume glenohumeral and subacromial/subdeltoid lipohemarthrosis with associated soft tissue and intramuscular edema of the right rotator cuff musculature. Electronically Signed   By: Harrietta Sherry M.D.   On: 05/10/2024 16:00   DG Chest 1 View Result Date: 05/10/2024 CLINICAL DATA:  Shoulder dislocation, hypoxia EXAM: CHEST  1 VIEW COMPARISON:  08/29/2022 FINDINGS: The right humeral head is dislocated anteriorly and  inferiorly. Large greater tuberosity fragment observed lateral to the lower glenoid. Low lung volumes are present, causing crowding of the pulmonary vasculature. Leadless pacer noted. Mild atelectasis at the lung bases. IMPRESSION: 1. Anterior inferior dislocation of the right humeral head. 2. Large right greater tuberosity fragment lateral to the lower glenoid. 3. Low lung volumes with mild atelectasis at the lung bases. Electronically Signed   By: Ryan Salvage M.D.   On: 05/10/2024 13:39   DG Shoulder Right Portable Result Date: 05/10/2024 EXAM: 1 VIEW XRAY OF THE RIGHT SHOULDER 05/10/2024 12:51:00 PM COMPARISON: Earlier today at 12:22 pm CLINICAL HISTORY: Shoulder reduction. Shoulder reduction #2; New hypoxia. FINDINGS: BONES AND JOINTS: Displaced greater tuberosity fracture fragment with Hill-Sachs lesion. Anterior dislocation of humeral head with respect to glenoid. SOFT TISSUES: No abnormal calcifications. Visualized lung is unremarkable. IMPRESSION: 1. Anterior dislocation of the right humeral head with respect to the glenoid. 2. Displaced greater tuberosity fracture fragment with Hill-Sachs lesion. 3. No significant change since earlier today. Electronically signed by: Rockey Kilts MD 05/10/2024 01:36 PM EDT RP Workstation: [id number]   DG Shoulder Right Portable Result Date: 05/10/2024 CLINICAL DATA:  Post reduction first attempt EXAM: RIGHT SHOULDER - 1 VIEW COMPARISON:  None Available. FINDINGS: Anterior dislocation of the shoulder. Avulsion of the greater tuberosity. IMPRESSION: Persistent anterior shoulder dislocation with humeral head avulsion. Electronically Signed   By: Jackquline Boxer M.D.   On: 05/10/2024 13:31   DG Humerus Right Result Date: 05/10/2024 CLINICAL DATA:  Shoulder pain, fall EXAM: RIGHT HUMERUS - 2+ VIEW COMPARISON:  None Available. FINDINGS: Anterior subcoracoid humeral dislocation at the glenohumeral joint with adjacent osseous fragment likely representing displaced  humeral head fracture fragment. IMPRESSION: Anterior humeral dislocation at the glenohumeral joint and likely humeral head fracture. Electronically Signed   By: Michaeline Blanch M.D.   On: 05/10/2024 10:50   CT Head Wo Contrast Result Date: 05/10/2024 CLINICAL DATA:  Provided history: Fall from standing.  Dizziness. EXAM: CT HEAD WITHOUT CONTRAST CT CERVICAL SPINE WITHOUT CONTRAST TECHNIQUE: Multidetector CT imaging of the head and cervical spine was performed following the standard protocol without intravenous contrast. Multiplanar CT image reconstructions of the cervical spine were also generated. RADIATION DOSE REDUCTION: This exam was performed according to the departmental dose-optimization program which includes automated exposure control, adjustment of the mA and/or kV according to patient size and/or use of iterative reconstruction technique. COMPARISON:  Brain MRI 01/05/2023. FINDINGS: CT HEAD FINDINGS Brain: Generalized cerebral atrophy. Moderate patchy and ill-defined hypoattenuation within the cerebral white matter and within/bout the basal ganglia, nonspecific but compatible  with chronic small vessel ischemic disease. Known 11 mm pituitary cyst, as was demonstrated on the prior brain MRI of 01/05/2023. No appreciable mass effect upon, or invasion of, regional structures. There is no acute intracranial hemorrhage. No demarcated cortical infarct. No extra-axial fluid collection. No evidence of an intracranial mass. No midline shift. Vascular: No hyperdense vessel.  Atherosclerotic calcifications. Skull: No calvarial fracture or aggressive osseous lesion. Sinuses/Orbits: No mass or acute finding within the imaged orbits. Trace mucosal thickening within the right maxillary sinus. Severe right anterior ethmoid and right frontal sinusitis. CT CERVICAL SPINE FINDINGS Alignment: Non severe bursal the expected cervical lordosis. 3 mm C2-C3 grade 1 anterolisthesis. 4 mm C3-C4 grade 1 retrolisthesis. 3 mm C4-C5  grade 1 anterolisthesis. 3 mm C7-T1 grade 1 anterolisthesis. Skull base and vertebrae: The basion-dental and atlanto-dental intervals are maintained.No evidence of acute fracture to the cervical spine. Soft tissues and spinal canal: No prevertebral fluid or swelling. No visible canal hematoma. Disc levels: Cervical spondylosis with multilevel disc space narrowing, disc bulges, posterior disc osteophyte complexes, endplate spurring, uncovertebral hypertrophy and facet arthropathy. Disc space narrowing is greatest at C3-C4, C4-C5, C5-C6, C6-C7 and T1-T2 (advanced at these levels). Facet ankylosis bilaterally at C3-C4. Degenerative changes also present at the C1-C2 articulation (including degenerative cystic changes within the dens). Multilevel ventral osteophytes. Upper chest: No consolidation within the imaged lung apices. No visible pneumothorax. IMPRESSION: CT head: 1.  No evidence of an acute intracranial abnormality. 2. Parenchymal atrophy and chronic small vessel ischemic disease. 3. Known 11 mm pituitary cyst. No appreciable mass effect upon, or invasion of, regional structures. No further imaging evaluation is necessary. This follows ACR consensus guidelines: Management of Incidental Pituitary Findings on CT, MRI and F18-FDG PET: A White Paper of the ACR Incidental Findings Committee. J Am Coll Radiol 2018; 15: 966-72. 4. Paranasal sinus disease as described. CT cervical spine: 1. No evidence of an acute cervical spine fracture. 2. Nonspecific reversal of the expected cervical lordosis. 3. Grade 1 anterolisthesis at C2-C3, C3-C4, C4-C5 and C7-T1. Electronically Signed   By: Rockey Childs D.O.   On: 05/10/2024 10:44   CT Cervical Spine Wo Contrast Result Date: 05/10/2024 CLINICAL DATA:  Provided history: Fall from standing.  Dizziness. EXAM: CT HEAD WITHOUT CONTRAST CT CERVICAL SPINE WITHOUT CONTRAST TECHNIQUE: Multidetector CT imaging of the head and cervical spine was performed following the standard  protocol without intravenous contrast. Multiplanar CT image reconstructions of the cervical spine were also generated. RADIATION DOSE REDUCTION: This exam was performed according to the departmental dose-optimization program which includes automated exposure control, adjustment of the mA and/or kV according to patient size and/or use of iterative reconstruction technique. COMPARISON:  Brain MRI 01/05/2023. FINDINGS: CT HEAD FINDINGS Brain: Generalized cerebral atrophy. Moderate patchy and ill-defined hypoattenuation within the cerebral white matter and within/bout the basal ganglia, nonspecific but compatible with chronic small vessel ischemic disease. Known 11 mm pituitary cyst, as was demonstrated on the prior brain MRI of 01/05/2023. No appreciable mass effect upon, or invasion of, regional structures. There is no acute intracranial hemorrhage. No demarcated cortical infarct. No extra-axial fluid collection. No evidence of an intracranial mass. No midline shift. Vascular: No hyperdense vessel.  Atherosclerotic calcifications. Skull: No calvarial fracture or aggressive osseous lesion. Sinuses/Orbits: No mass or acute finding within the imaged orbits. Trace mucosal thickening within the right maxillary sinus. Severe right anterior ethmoid and right frontal sinusitis. CT CERVICAL SPINE FINDINGS Alignment: Non severe bursal the expected cervical lordosis. 3 mm C2-C3 grade  1 anterolisthesis. 4 mm C3-C4 grade 1 retrolisthesis. 3 mm C4-C5 grade 1 anterolisthesis. 3 mm C7-T1 grade 1 anterolisthesis. Skull base and vertebrae: The basion-dental and atlanto-dental intervals are maintained.No evidence of acute fracture to the cervical spine. Soft tissues and spinal canal: No prevertebral fluid or swelling. No visible canal hematoma. Disc levels: Cervical spondylosis with multilevel disc space narrowing, disc bulges, posterior disc osteophyte complexes, endplate spurring, uncovertebral hypertrophy and facet arthropathy. Disc  space narrowing is greatest at C3-C4, C4-C5, C5-C6, C6-C7 and T1-T2 (advanced at these levels). Facet ankylosis bilaterally at C3-C4. Degenerative changes also present at the C1-C2 articulation (including degenerative cystic changes within the dens). Multilevel ventral osteophytes. Upper chest: No consolidation within the imaged lung apices. No visible pneumothorax. IMPRESSION: CT head: 1.  No evidence of an acute intracranial abnormality. 2. Parenchymal atrophy and chronic small vessel ischemic disease. 3. Known 11 mm pituitary cyst. No appreciable mass effect upon, or invasion of, regional structures. No further imaging evaluation is necessary. This follows ACR consensus guidelines: Management of Incidental Pituitary Findings on CT, MRI and F18-FDG PET: A White Paper of the ACR Incidental Findings Committee. J Am Coll Radiol 2018; 15: 966-72. 4. Paranasal sinus disease as described. CT cervical spine: 1. No evidence of an acute cervical spine fracture. 2. Nonspecific reversal of the expected cervical lordosis. 3. Grade 1 anterolisthesis at C2-C3, C3-C4, C4-C5 and C7-T1. Electronically Signed   By: Rockey Childs D.O.   On: 05/10/2024 10:44    Microbiology: Results for orders placed or performed during the hospital encounter of 05/11/24  Surgical pcr screen     Status: None   Collection Time: 05/11/24 12:07 PM   Specimen: Nasal Mucosa; Nasal Swab  Result Value Ref Range Status   MRSA, PCR NEGATIVE NEGATIVE Final   Staphylococcus aureus NEGATIVE NEGATIVE Final    Comment: (NOTE) The Xpert SA Assay (FDA approved for NASAL specimens in patients 63 years of age and older), is one component of a comprehensive surveillance program. It is not intended to diagnose infection nor to guide or monitor treatment. Performed at [facility], [address], [place], [place] [address]    *Note: Due to a large number of results and/or encounters for the requested time period, some results have not been  displayed. A complete set of results can be found in Results Review.    Labs: CBC: Recent Labs  Lab 05/10/24 0933 05/10/24 1520 05/11/24 0304 05/12/24 0734 05/13/24 0358 05/14/24 1026 05/16/24 0656  WBC 14.2*   < > 12.4* 11.8* 11.5* 11.9* 12.1*  NEUTROABS 11.5*  --   --   --   --   --   --   HGB 15.7*   < > 13.6 12.6 11.1* 11.2* 10.7*  HCT 49.3*   < > 43.1 39.5 33.9* 35.2* 34.1*  MCV 101.0*   < > 101.7* 101.5* 99.4 101.1* 101.8*  PLT 267   < > 264 201 206 265 301   < > = values in this interval not displayed.   Basic Metabolic Panel: Recent Labs  Lab 05/10/24 1032 05/10/24 1520 05/11/24 0304 05/12/24 1059 05/14/24 0647 05/16/24 0656  NA 139  --  140 141 138 138  K 4.3  --  4.6 4.4 4.1 4.5  CL 101  --  100 104 102 102  CO2 26  --  27 20* 25 23  GLUCOSE 155*  --  167* 170* 155* 166*  BUN 31*  --  34* 29* 20 19  CREATININE 1.04* 0.86 1.55* 1.31* 0.80 0.90  CALCIUM  8.9  --  8.3* 8.3* 8.2* 8.2*   Liver Function Tests: Recent Labs  Lab 05/11/24 0304  AST 50*  ALT 24  ALKPHOS 57  BILITOT 0.9  PROT 5.9*  ALBUMIN  3.3*   CBG: Recent Labs  Lab 05/11/24 0914 05/11/24 1106 05/11/24 1259 05/11/24 1454 05/11/24 1856  GLUCAP 133* 139* 134* 118* 146*    Discharge time spent: approximately 45 minutes spent on discharge counseling, evaluation of patient on day of discharge, and coordination of discharge planning with nursing, social work, pharmacy and case management  Signed: Lonni SHAUNNA Dalton, MD [facility] 05/16/2024

## 2024-05-17 DIAGNOSIS — N179 Acute kidney failure, unspecified: Secondary | ICD-10-CM | POA: Diagnosis not present

## 2024-05-17 DIAGNOSIS — I480 Paroxysmal atrial fibrillation: Secondary | ICD-10-CM | POA: Diagnosis not present

## 2024-05-17 DIAGNOSIS — R2689 Other abnormalities of gait and mobility: Secondary | ICD-10-CM | POA: Diagnosis not present

## 2024-05-17 DIAGNOSIS — S42201A Unspecified fracture of upper end of right humerus, initial encounter for closed fracture: Secondary | ICD-10-CM | POA: Diagnosis not present

## 2024-05-17 DIAGNOSIS — I10 Essential (primary) hypertension: Secondary | ICD-10-CM | POA: Diagnosis not present

## 2024-05-17 DIAGNOSIS — Z950 Presence of cardiac pacemaker: Secondary | ICD-10-CM | POA: Diagnosis not present

## 2024-05-17 DIAGNOSIS — S42201D Unspecified fracture of upper end of right humerus, subsequent encounter for fracture with routine healing: Secondary | ICD-10-CM | POA: Diagnosis not present

## 2024-05-17 DIAGNOSIS — Z9181 History of falling: Secondary | ICD-10-CM | POA: Diagnosis not present

## 2024-05-17 DIAGNOSIS — I5032 Chronic diastolic (congestive) heart failure: Secondary | ICD-10-CM | POA: Diagnosis not present

## 2024-05-17 DIAGNOSIS — M6281 Muscle weakness (generalized): Secondary | ICD-10-CM | POA: Diagnosis not present

## 2024-05-19 DIAGNOSIS — I10 Essential (primary) hypertension: Secondary | ICD-10-CM | POA: Diagnosis not present

## 2024-05-19 DIAGNOSIS — N179 Acute kidney failure, unspecified: Secondary | ICD-10-CM | POA: Diagnosis not present

## 2024-05-19 DIAGNOSIS — Z950 Presence of cardiac pacemaker: Secondary | ICD-10-CM | POA: Diagnosis not present

## 2024-05-19 DIAGNOSIS — I480 Paroxysmal atrial fibrillation: Secondary | ICD-10-CM | POA: Diagnosis not present

## 2024-05-19 DIAGNOSIS — I5032 Chronic diastolic (congestive) heart failure: Secondary | ICD-10-CM | POA: Diagnosis not present

## 2024-05-19 DIAGNOSIS — S42201A Unspecified fracture of upper end of right humerus, initial encounter for closed fracture: Secondary | ICD-10-CM | POA: Diagnosis not present

## 2024-05-20 DIAGNOSIS — S42201D Unspecified fracture of upper end of right humerus, subsequent encounter for fracture with routine healing: Secondary | ICD-10-CM | POA: Diagnosis not present

## 2024-05-20 DIAGNOSIS — I480 Paroxysmal atrial fibrillation: Secondary | ICD-10-CM | POA: Diagnosis not present

## 2024-05-20 DIAGNOSIS — M6281 Muscle weakness (generalized): Secondary | ICD-10-CM | POA: Diagnosis not present

## 2024-05-20 DIAGNOSIS — R2689 Other abnormalities of gait and mobility: Secondary | ICD-10-CM | POA: Diagnosis not present

## 2024-05-20 DIAGNOSIS — Z9181 History of falling: Secondary | ICD-10-CM | POA: Diagnosis not present

## 2024-05-20 DIAGNOSIS — I5032 Chronic diastolic (congestive) heart failure: Secondary | ICD-10-CM | POA: Diagnosis not present

## 2024-05-23 ENCOUNTER — Encounter: Payer: Self-pay | Admitting: Surgery

## 2024-05-23 DIAGNOSIS — I480 Paroxysmal atrial fibrillation: Secondary | ICD-10-CM | POA: Diagnosis not present

## 2024-05-23 DIAGNOSIS — N179 Acute kidney failure, unspecified: Secondary | ICD-10-CM | POA: Diagnosis not present

## 2024-05-23 DIAGNOSIS — I10 Essential (primary) hypertension: Secondary | ICD-10-CM | POA: Diagnosis not present

## 2024-05-23 DIAGNOSIS — S42201A Unspecified fracture of upper end of right humerus, initial encounter for closed fracture: Secondary | ICD-10-CM | POA: Diagnosis not present

## 2024-05-23 DIAGNOSIS — I5032 Chronic diastolic (congestive) heart failure: Secondary | ICD-10-CM | POA: Diagnosis not present

## 2024-05-23 DIAGNOSIS — Z950 Presence of cardiac pacemaker: Secondary | ICD-10-CM | POA: Diagnosis not present

## 2024-05-24 DIAGNOSIS — N179 Acute kidney failure, unspecified: Secondary | ICD-10-CM | POA: Diagnosis not present

## 2024-05-24 DIAGNOSIS — Z950 Presence of cardiac pacemaker: Secondary | ICD-10-CM | POA: Diagnosis not present

## 2024-05-24 DIAGNOSIS — Z9181 History of falling: Secondary | ICD-10-CM | POA: Diagnosis not present

## 2024-05-24 DIAGNOSIS — M48061 Spinal stenosis, lumbar region without neurogenic claudication: Secondary | ICD-10-CM | POA: Diagnosis not present

## 2024-05-24 DIAGNOSIS — M6281 Muscle weakness (generalized): Secondary | ICD-10-CM | POA: Diagnosis not present

## 2024-05-24 DIAGNOSIS — E119 Type 2 diabetes mellitus without complications: Secondary | ICD-10-CM | POA: Diagnosis not present

## 2024-05-24 DIAGNOSIS — I110 Hypertensive heart disease with heart failure: Secondary | ICD-10-CM | POA: Diagnosis not present

## 2024-05-24 DIAGNOSIS — I442 Atrioventricular block, complete: Secondary | ICD-10-CM | POA: Diagnosis not present

## 2024-05-24 DIAGNOSIS — S42301A Unspecified fracture of shaft of humerus, right arm, initial encounter for closed fracture: Secondary | ICD-10-CM | POA: Diagnosis not present

## 2024-05-24 DIAGNOSIS — I5032 Chronic diastolic (congestive) heart failure: Secondary | ICD-10-CM | POA: Diagnosis not present

## 2024-05-24 DIAGNOSIS — R2689 Other abnormalities of gait and mobility: Secondary | ICD-10-CM | POA: Diagnosis not present

## 2024-05-24 DIAGNOSIS — S42201D Unspecified fracture of upper end of right humerus, subsequent encounter for fracture with routine healing: Secondary | ICD-10-CM | POA: Diagnosis not present

## 2024-05-24 DIAGNOSIS — I480 Paroxysmal atrial fibrillation: Secondary | ICD-10-CM | POA: Diagnosis not present

## 2024-05-25 DIAGNOSIS — I5032 Chronic diastolic (congestive) heart failure: Secondary | ICD-10-CM | POA: Diagnosis not present

## 2024-05-25 DIAGNOSIS — I480 Paroxysmal atrial fibrillation: Secondary | ICD-10-CM | POA: Diagnosis not present

## 2024-05-25 DIAGNOSIS — N179 Acute kidney failure, unspecified: Secondary | ICD-10-CM | POA: Diagnosis not present

## 2024-05-25 DIAGNOSIS — Z950 Presence of cardiac pacemaker: Secondary | ICD-10-CM | POA: Diagnosis not present

## 2024-05-25 DIAGNOSIS — I10 Essential (primary) hypertension: Secondary | ICD-10-CM | POA: Diagnosis not present

## 2024-05-25 DIAGNOSIS — S42201A Unspecified fracture of upper end of right humerus, initial encounter for closed fracture: Secondary | ICD-10-CM | POA: Diagnosis not present

## 2024-05-26 ENCOUNTER — Telehealth: Payer: Self-pay | Admitting: Internal Medicine

## 2024-05-26 NOTE — Telephone Encounter (Signed)
 Copied from CRM [id number]. Topic: General - Other >> May 26, 2024  1:58 PM Donna BRAVO wrote: Reason for CRM: patient asking for update on papers/note to continue having someone care for her. And asking if the paper is ready to pick up.  Please reference note on 04/29/24  Please call [name] Shaver, Son, with any questions, and when note is ready to pick up.[name] Shaver (son) [phone]

## 2024-05-26 NOTE — Telephone Encounter (Signed)
 Called and spoke to pt. The letter was put up front. Or, I think someone from up front came and got it off of my desk. It is available in MyChart also.

## 2024-05-27 DIAGNOSIS — I5032 Chronic diastolic (congestive) heart failure: Secondary | ICD-10-CM | POA: Diagnosis not present

## 2024-05-27 DIAGNOSIS — M6281 Muscle weakness (generalized): Secondary | ICD-10-CM | POA: Diagnosis not present

## 2024-05-27 DIAGNOSIS — I480 Paroxysmal atrial fibrillation: Secondary | ICD-10-CM | POA: Diagnosis not present

## 2024-05-27 DIAGNOSIS — S42201D Unspecified fracture of upper end of right humerus, subsequent encounter for fracture with routine healing: Secondary | ICD-10-CM | POA: Diagnosis not present

## 2024-05-27 DIAGNOSIS — R2689 Other abnormalities of gait and mobility: Secondary | ICD-10-CM | POA: Diagnosis not present

## 2024-05-27 DIAGNOSIS — Z9181 History of falling: Secondary | ICD-10-CM | POA: Diagnosis not present

## 2024-05-27 NOTE — Telephone Encounter (Signed)
 Pt's son called to find out if the letter was up front because the one on MyChart will not have a signature. He was told it was not up front. I called him back at [phone]. I went up front and looked and found it in the file up front. He will come get it shortly.

## 2024-05-31 DIAGNOSIS — M6281 Muscle weakness (generalized): Secondary | ICD-10-CM | POA: Diagnosis not present

## 2024-05-31 DIAGNOSIS — S42201D Unspecified fracture of upper end of right humerus, subsequent encounter for fracture with routine healing: Secondary | ICD-10-CM | POA: Diagnosis not present

## 2024-05-31 DIAGNOSIS — I5032 Chronic diastolic (congestive) heart failure: Secondary | ICD-10-CM | POA: Diagnosis not present

## 2024-05-31 DIAGNOSIS — I480 Paroxysmal atrial fibrillation: Secondary | ICD-10-CM | POA: Diagnosis not present

## 2024-05-31 DIAGNOSIS — Z9181 History of falling: Secondary | ICD-10-CM | POA: Diagnosis not present

## 2024-05-31 DIAGNOSIS — S43014D Anterior dislocation of right humerus, subsequent encounter: Secondary | ICD-10-CM | POA: Diagnosis not present

## 2024-05-31 DIAGNOSIS — R2689 Other abnormalities of gait and mobility: Secondary | ICD-10-CM | POA: Diagnosis not present

## 2024-06-03 ENCOUNTER — Telehealth: Payer: Self-pay

## 2024-06-03 DIAGNOSIS — Z9181 History of falling: Secondary | ICD-10-CM | POA: Diagnosis not present

## 2024-06-03 DIAGNOSIS — M6281 Muscle weakness (generalized): Secondary | ICD-10-CM | POA: Diagnosis not present

## 2024-06-03 DIAGNOSIS — R2689 Other abnormalities of gait and mobility: Secondary | ICD-10-CM | POA: Diagnosis not present

## 2024-06-03 DIAGNOSIS — I5032 Chronic diastolic (congestive) heart failure: Secondary | ICD-10-CM | POA: Diagnosis not present

## 2024-06-03 DIAGNOSIS — I480 Paroxysmal atrial fibrillation: Secondary | ICD-10-CM | POA: Diagnosis not present

## 2024-06-03 DIAGNOSIS — S42201D Unspecified fracture of upper end of right humerus, subsequent encounter for fracture with routine healing: Secondary | ICD-10-CM | POA: Diagnosis not present

## 2024-06-03 NOTE — Telephone Encounter (Signed)
 Copied from CRM [id number]. Topic: Appointments - Transfer of Care >> Jun 03, 2024  1:56 PM Alfonso ORN wrote: Pt is requesting to transfer FROM: [name] Pt is requesting to transfer TO: Bedsole, Amy Reason for requested transfer: provider no longer active It is the responsibility of the team the patient would like to transfer to (Dr. Avelina) to reach out to the patient if for any reason this transfer is not acceptable.

## 2024-06-06 ENCOUNTER — Emergency Department (HOSPITAL_COMMUNITY)

## 2024-06-06 ENCOUNTER — Other Ambulatory Visit: Payer: Self-pay

## 2024-06-06 ENCOUNTER — Emergency Department (HOSPITAL_COMMUNITY)
Admission: EM | Admit: 2024-06-06 | Discharge: 2024-06-09 | Disposition: A | Discharge status: Skilled Nursing Facility | Attending: Emergency Medicine | Admitting: Emergency Medicine

## 2024-06-06 DIAGNOSIS — N1831 Chronic kidney disease, stage 3a: Secondary | ICD-10-CM | POA: Insufficient documentation

## 2024-06-06 DIAGNOSIS — E1122 Type 2 diabetes mellitus with diabetic chronic kidney disease: Secondary | ICD-10-CM | POA: Diagnosis not present

## 2024-06-06 DIAGNOSIS — R262 Difficulty in walking, not elsewhere classified: Secondary | ICD-10-CM | POA: Insufficient documentation

## 2024-06-06 DIAGNOSIS — Z7984 Long term (current) use of oral hypoglycemic drugs: Secondary | ICD-10-CM | POA: Diagnosis not present

## 2024-06-06 DIAGNOSIS — I10 Essential (primary) hypertension: Secondary | ICD-10-CM | POA: Diagnosis not present

## 2024-06-06 DIAGNOSIS — Z741 Need for assistance with personal care: Secondary | ICD-10-CM | POA: Insufficient documentation

## 2024-06-06 DIAGNOSIS — Z7982 Long term (current) use of aspirin: Secondary | ICD-10-CM | POA: Diagnosis not present

## 2024-06-06 DIAGNOSIS — S42201A Unspecified fracture of upper end of right humerus, initial encounter for closed fracture: Secondary | ICD-10-CM | POA: Diagnosis not present

## 2024-06-06 DIAGNOSIS — I5032 Chronic diastolic (congestive) heart failure: Secondary | ICD-10-CM | POA: Diagnosis not present

## 2024-06-06 DIAGNOSIS — Z79899 Other long term (current) drug therapy: Secondary | ICD-10-CM | POA: Insufficient documentation

## 2024-06-06 DIAGNOSIS — Z96611 Presence of right artificial shoulder joint: Secondary | ICD-10-CM | POA: Diagnosis not present

## 2024-06-06 DIAGNOSIS — I130 Hypertensive heart and chronic kidney disease with heart failure and stage 1 through stage 4 chronic kidney disease, or unspecified chronic kidney disease: Secondary | ICD-10-CM | POA: Diagnosis not present

## 2024-06-06 DIAGNOSIS — M25511 Pain in right shoulder: Secondary | ICD-10-CM | POA: Diagnosis not present

## 2024-06-06 MED ORDER — HYDROMORPHONE HCL 1 MG/ML IJ SOLN
1.0000 mg | Freq: Once | INTRAMUSCULAR | Status: AC
  Administered 2024-06-07: 1 mg via INTRAVENOUS
  Filled 2024-06-06: qty 1

## 2024-06-06 NOTE — ED Triage Notes (Signed)
 Pt was [facility] from home. She was discharged by [facility] due to insurance issues. Family called when she was brought home because they can't take care of her per EMS. Patient is incontinent and son can't change her per EMS. No other medical complaints Recent shoulder surgery 05/14/2024.  Vitals 118/60 HR 76 97% RA 212 CBG

## 2024-06-06 NOTE — ED Provider Notes (Incomplete)
 [place] EM[facility] DE[facility]MENT AT [facility] Provider Note   CSN: [id number] Arrival date & time: 06/06/24  2117     Patient presents with: Shoulder Injury (Needs long term placement)   [name] is a 78 y.o. female with history of essential hypertension, IBS, mild cognitive impairment, type 2 diabetes, chronic back pain, spinal stenosis, chronic narcotic dependence, chronic diastolic CHF, stage IIIa CKD, paroxysmal A-fib, closed fracture of right proximal humerus with recent ORIF of right humerus.  Patient presents to the ED for evaluation.  Patient apparently was discharged from her SNF today due to insurance issues.  The patient reports that she has been receiving PT/OT at her SNF and pain medication every 4 hours, 5 mg oxycodone .  She states that her operation was completed on 9/3 and patient was discharged to SNF for rehab.  States that she was discharged today due to insurance running out, was sent home to her home where she lives with her son.  Patient was there for 1 hour and patient son determined he was unable to care for patient because the patient needed to have her diaper changed and he does not like to do this.  Per patient, the patient son is unemployed but has mental issues.  They live in a trailer home with stairs leading up to the trailer home.  Spoke with patient son, [name], who reports that he is unable to care for patient at home due to diaper changes.  States that he does not prefer to do this.   Shoulder Injury       Prior to Admission medications   Medication Sig Start Date End Date Taking? Authorizing Provider  ACCU-CHEK SMARTVIEW test strip Use to check blood sugar once daily 03/21/24   Letvak, Richard I, MD  acetaminophen  (TYLENOL ) 325 MG tablet Take 2 tablets (650 mg total) by mouth every 6 (six) hours as needed. 05/15/24   Danford, Lonni SQUIBB, MD  ARIPiprazole  (ABILIFY ) 2 MG tablet Take 2 mg by mouth daily. 04/27/24   [provider]  aspirin  EC 81 MG tablet Take 1 tablet (81 mg total) by mouth daily. 05/15/24 06/14/24  Jonel Lonni SQUIBB, MD  atorvastatin  (LIPITOR ) 80 MG tablet TAKE 1 TABLET BY MOUTH ONCE  DAILY 01/20/24   Jimmy Charlie FERNS, MD  bisacodyl  (DULCOLAX) 10 MG suppository Place 1 suppository (10 mg total) rectally daily as needed for moderate constipation. 05/15/24   Danford, Lonni SQUIBB, MD  cholecalciferol  (VITAMIN D3) 25 MCG (1000 UNIT) tablet Take 1,000 Units by mouth daily.    [provider]  docusate sodium  (COLACE) 100 MG capsule Take 1 capsule (100 mg total) by mouth 2 (two) times daily. 05/15/24   Jonel Lonni SQUIBB, MD  donepezil  (ARICEPT ) 10 MG tablet TAKE 1 TABLET BY MOUTH EVERY  MORNING 03/17/24   Jimmy Charlie I, MD  DULoxetine  (CYMBALTA ) 60 MG capsule TAKE 1 CAPSULE BY MOUTH DAILY 05/04/24   Jimmy Charlie FERNS, MD  gabapentin  (NEURONTIN ) 300 MG capsule Take 2 capsules (600 mg total) by mouth 2 (two) times daily. And 900 mg at bedtime 09/23/23   Letvak, Richard I, MD  lisinopril -hydrochlorothiazide  (ZESTORETIC ) 20-12.5 MG tablet TAKE 2 TABLETS BY MOUTH DAILY 03/30/24   Jimmy Charlie FERNS, MD  metFORMIN  (GLUCOPHAGE -XR) 500 MG 24 hr tablet Take 1 tablet (500 mg total) by mouth daily with breakfast. 04/27/24   Jimmy Charlie FERNS, MD  methocarbamol  (ROBAXIN ) 500 MG tablet Take 1 tablet (500 mg total) by mouth every 6 (  six) hours as needed for muscle spasms. 05/13/24   Danton Lauraine LABOR, PA-C  metoprolol  succinate (TOPROL -XL) 25 MG 24 hr tablet Take 1 tablet (25 mg total) by mouth daily. 02/02/24   Jimmy Charlie FERNS, MD  Multiple Vitamins-Minerals (MULTIVITAMIN WITH MINERALS) tablet Take 1 tablet by mouth daily.     [provider]  oxyCODONE  (OXY IR/ROXICODONE ) 5 MG immediate release tablet Take 0.5-1 tablets (2.5-5 mg total) by mouth every 4 (four) hours as needed (2.5 mg for pain score 4-6, 5 mg for pain score 7-10). 05/15/24   Danford, Lonni SQUIBB, MD  polyethylene glycol (MIRALAX  /  GLYCOLAX ) 17 g packet Take 17 g by mouth daily. 05/16/24   Danford, Lonni SQUIBB, MD  senna (SENOKOT) 8.6 MG TABS tablet Take 1 tablet (8.6 mg total) by mouth daily. 05/16/24   Danford, Lonni SQUIBB, MD    Allergies: Patient has no active allergies.    Review of Systems  Updated Vital Signs BP 123/80 (BP Location: Left Arm)   Pulse 67   Temp 98 F (36.7 C) (Temporal)   Resp 19   SpO2 100%   Physical Exam  (all labs ordered are listed, but only abnormal results are displayed) Labs Reviewed - No data to display  EKG: None  Radiology: No results found.  {Document cardiac monitor, telemetry assessment procedure when appropriate:32947} Procedures   Medications Ordered in the ED  HYDROmorphone  (DILAUDID ) injection 1 mg (has no administration in time range)      {Click here for ABCD2, HEART and other calculators REFRESH Note before signing:1}                              Medical Decision Making Amount and/or Complexity of Data Reviewed Labs: ordered. Radiology: ordered. ECG/medicine tests: ordered.  Risk Prescription drug management.   ***  {Document critical care time when appropriate  Document review of labs and clinical decision tools ie CHADS2VASC2, etc  Document your independent review of radiology images and any outside records  Document your discussion with family members, caretakers and with consultants  Document social determinants of health affecting pt's care  Document your decision making why or why not admission, treatments were needed:32947:::1}   Final diagnoses:  None    ED Discharge Orders     None

## 2024-06-06 NOTE — ED Provider Notes (Signed)
 [place] EMERGENCY DEPARTMENT AT [facility] Provider Note   CSN: [id number] Arrival date & time: 06/06/24  2117     Patient presents with: Shoulder Injury (Needs long term placement)   [name] is a 78 y.o. female with history of essential hypertension, IBS, mild cognitive impairment, type 2 diabetes, chronic back pain, spinal stenosis, chronic narcotic dependence, chronic diastolic CHF, stage IIIa CKD, paroxysmal A-fib, closed fracture of right proximal humerus with recent ORIF of right humerus.  Patient presents to the ED for evaluation.  Patient apparently was discharged from her SNF today due to insurance issues.  The patient reports that she has been receiving PT/OT at her SNF and pain medication every 4 hours, 5 mg oxycodone .  She states that her operation was completed on 9/3 and patient was discharged to SNF for rehab.  States that she was discharged today due to insurance running out, was sent home to her home where she lives with her son.  Patient was there for 1 hour and patient son determined he was unable to care for patient because the patient needed to have her diaper changed and he does not like to do this.  Per patient, the patient son is unemployed but has mental issues.  They live in a trailer home with stairs leading up to the trailer home.  She denies any medical complaints.  She complains of chronic low back pain which she denies any new features to.  She denies any bowel or bladder incontinence, groin numbness or distal weakness.  She has 5 out of 5 strength of bilateral lower extremities.  Spoke with patient son, [name], who reports that he is unable to care for patient at home due to diaper changes.  States that he does not prefer to do this.  Reports that they have 5 steps into their house.  He is requesting that patient be placed in a long-term care paid for by the state.   Shoulder Injury       Prior to Admission medications   Medication Sig  Start Date End Date Taking? Authorizing Provider  ACCU-CHEK SMARTVIEW test strip Use to check blood sugar once daily 03/21/24   Letvak, Richard I, MD  acetaminophen  (TYLENOL ) 325 MG tablet Take 2 tablets (650 mg total) by mouth every 6 (six) hours as needed. 05/15/24   Danford, Lonni SQUIBB, MD  ARIPiprazole  (ABILIFY ) 2 MG tablet Take 2 mg by mouth daily. 04/27/24   [provider]  aspirin  EC 81 MG tablet Take 1 tablet (81 mg total) by mouth daily. 05/15/24 06/14/24  DanfordLonni SQUIBB, MD  atorvastatin  (LIPITOR ) 80 MG tablet TAKE 1 TABLET BY MOUTH ONCE  DAILY 01/20/24   Jimmy Charlie FERNS, MD  bisacodyl  (DULCOLAX) 10 MG suppository Place 1 suppository (10 mg total) rectally daily as needed for moderate constipation. 05/15/24   Danford, Lonni SQUIBB, MD  cholecalciferol  (VITAMIN D3) 25 MCG (1000 UNIT) tablet Take 1,000 Units by mouth daily.    [provider]  docusate sodium  (COLACE) 100 MG capsule Take 1 capsule (100 mg total) by mouth 2 (two) times daily. 05/15/24   Jonel Lonni SQUIBB, MD  donepezil  (ARICEPT ) 10 MG tablet TAKE 1 TABLET BY MOUTH EVERY  MORNING 03/17/24   Jimmy Charlie I, MD  DULoxetine  (CYMBALTA ) 60 MG capsule TAKE 1 CAPSULE BY MOUTH DAILY 05/04/24   Jimmy Charlie FERNS, MD  gabapentin  (NEURONTIN ) 300 MG capsule Take 2 capsules (600 mg total) by mouth 2 (two) times daily.  And 900 mg at bedtime 09/23/23   Letvak, Richard I, MD  lisinopril -hydrochlorothiazide  (ZESTORETIC ) 20-12.5 MG tablet TAKE 2 TABLETS BY MOUTH DAILY 03/30/24   Letvak, Richard I, MD  metFORMIN  (GLUCOPHAGE -XR) 500 MG 24 hr tablet Take 1 tablet (500 mg total) by mouth daily with breakfast. 04/27/24   Jimmy Charlie FERNS, MD  methocarbamol  (ROBAXIN ) 500 MG tablet Take 1 tablet (500 mg total) by mouth every 6 (six) hours as needed for muscle spasms. 05/13/24   Danton Lauraine LABOR, PA-C  metoprolol  succinate (TOPROL -XL) 25 MG 24 hr tablet Take 1 tablet (25 mg total) by mouth daily. 02/02/24   Jimmy Charlie FERNS, MD   Multiple Vitamins-Minerals (MULTIVITAMIN WITH MINERALS) tablet Take 1 tablet by mouth daily.     [provider]  oxyCODONE  (OXY IR/ROXICODONE ) 5 MG immediate release tablet Take 0.5-1 tablets (2.5-5 mg total) by mouth every 4 (four) hours as needed (2.5 mg for pain score 4-6, 5 mg for pain score 7-10). 05/15/24   Danford, Lonni SQUIBB, MD  polyethylene glycol (MIRALAX  / GLYCOLAX ) 17 g packet Take 17 g by mouth daily. 05/16/24   Danford, Lonni SQUIBB, MD  senna (SENOKOT) 8.6 MG TABS tablet Take 1 tablet (8.6 mg total) by mouth daily. 05/16/24   Danford, Lonni SQUIBB, MD    Allergies: Patient has no active allergies.    Review of Systems  Musculoskeletal:  Positive for arthralgias.  All other systems reviewed and are negative.   Updated Vital Signs BP (!) 96/53   Pulse 76   Temp 98 F (36.7 C) (Temporal)   Resp 20   SpO2 100%   Physical Exam Vitals and nursing note reviewed.  Constitutional:      General: She is not in acute distress.    Appearance: She is well-developed.  HENT:     Head: Normocephalic and atraumatic.  Eyes:     Conjunctiva/sclera: Conjunctivae normal.  Cardiovascular:     Rate and Rhythm: Normal rate and regular rhythm.     Heart sounds: No murmur heard. Pulmonary:     Effort: Pulmonary effort is normal. No respiratory distress.     Breath sounds: Normal breath sounds.  Abdominal:     Palpations: Abdomen is soft.     Tenderness: There is no abdominal tenderness.  Musculoskeletal:        General: No swelling.     Cervical back: Neck supple.       Back:     Comments: Status post ORIF right shoulder.  Incision clean dry and intact.  Reduced ROM secondary to pain.  No swelling.  Skin:    General: Skin is warm and dry.     Capillary Refill: Capillary refill takes less than 2 seconds.  Neurological:     Mental Status: She is alert and oriented to person, place, and time. Mental status is at baseline.     Comments: Alert and oriented x 4.  5 out of  5 strength of bilateral lower extremities.  Psychiatric:        Mood and Affect: Mood normal.     (all labs ordered are listed, but only abnormal results are displayed) Labs Reviewed  CBC - Abnormal; Notable for the following components:      Result Value   MCV 101.5 (*)    All other components within normal limits  BASIC METABOLIC PANEL WITH GFR - Abnormal; Notable for the following components:   Glucose, Bld 140 (*)    Calcium  8.7 (*)  All other components within normal limits    EKG: EKG Interpretation Date/Time:  Tuesday June 07 2024 00:01:08 EDT Ventricular Rate:  81 PR Interval:  153 QRS Duration:  155 QT Interval:  410 QTC Calculation: 476 R Axis:   269  Text Interpretation: Sinus rhythm Multiple premature complexes, vent & supraven Nonspecific IVCD with LAD Inferior infarct, acute Anterior infarct, acute (LAD) Lateral leads are also involved Similar to prior Confirmed by Jerral Meth [phone]) on 06/07/2024 12:06:58 AM  Radiology: DG Shoulder Right Portable Result Date: 06/06/2024 CLINICAL DATA:  Recent shoulder surgery with frequent right shoulder pain EXAM: RIGHT SHOULDER - 1 VIEW COMPARISON:  May 11, 2024 FINDINGS: A radiopaque fixation plate and screws are again seen along the proximal right humerus. An underlying acute fracture defer is noted, involving the right humeral head neck and proximal shaft. There is no evidence of dislocation. Soft tissues are unremarkable. IMPRESSION: 1. ORIF of an acute fracture of the proximal right humerus. Electronically Signed   By: Suzen Dials M.D.   On: 06/06/2024 23:04    Procedures   Medications Ordered in the ED  oxyCODONE -acetaminophen  (PERCOCET/ROXICET) 5-325 MG per tablet 1 tablet (1 tablet Oral Given 06/07/24 0236)  HYDROmorphone  (DILAUDID ) injection 1 mg (1 mg Intravenous Given 06/07/24 0012)   Medical Decision Making Amount and/or Complexity of Data Reviewed Labs: ordered. Radiology:  ordered. ECG/medicine tests: ordered.  Risk Prescription drug management.   This is a 78 year old female who presents to the ED after returning from SNF to her house where her son noted that he was unable to take care of the patient.  On arrival she is afebrile and nontachycardic.  Her lung sounds are clear bilaterally, no hypoxia.  Abdomen is soft and compressible.  Neuroexam at baseline.  Right shoulder incision from ORIF is clean dry and intact.  No swelling.  Reduced ROM secondary to pain.  5 out of 5 strength of bilateral lower extremities.  Patient son reported to bedside.  States that he is unable to care for patient at home, unable to change diapers as he does not prefer this.  They apparently live in a trailer with 5 steps up.  He reports that he is unable to care for the patient at home.  He is requesting the patient placed in long-term care.  Patient labs collected.  Grossly unremarkable.  X-ray imaging of right shoulder collected which shows no postop complications.  Patient given Dilaudid  1 mg for pain control.  At this time patient placed in [facility] boarder status.  Placed a TOC consult requesting assistance with placement.  Diet ordered.  PTA medical reconciliation ordered.  Patient stable at this time.   Final diagnoses:  Need for assistance with personal care    ED Discharge Orders     None          Anh Mangano F, PA-C 06/07/24 0246    Jerral Meth, MD 06/07/24 0700

## 2024-06-07 LAB — BASIC METABOLIC PANEL WITH GFR
Anion gap: 13 (ref 5–15)
BUN: 15 mg/dL (ref 8–23)
CO2: 25 mmol/L (ref 22–32)
Calcium: 8.7 mg/dL — ABNORMAL LOW (ref 8.9–10.3)
Chloride: 102 mmol/L (ref 98–111)
Creatinine, Ser: 0.93 mg/dL (ref 0.44–1.00)
GFR, Estimated: >60 mL/min (ref >60)
Glucose, Bld: 140 mg/dL — ABNORMAL HIGH (ref 70–99)
Potassium: 3.9 mmol/L (ref 3.5–5.1)
Sodium: 140 mmol/L (ref 135–145)

## 2024-06-07 LAB — CBC
HCT: 40.8 % (ref 36.0–46.0)
Hemoglobin: 12.9 g/dL (ref 12.0–15.0)
MCH: 32.1 pg (ref 26.0–34.0)
MCHC: 31.6 g/dL (ref 30.0–36.0)
MCV: 101.5 fL — ABNORMAL HIGH (ref 80.0–100.0)
Platelets: 315 K/uL (ref 150–400)
RBC: 4.02 MIL/uL (ref 3.87–5.11)
RDW: 14.1 % (ref 11.5–15.5)
WBC: 10.4 K/uL (ref 4.0–10.5)
nRBC: 0 % (ref 0.0–0.2)

## 2024-06-07 MED ORDER — VITAMIN D 25 MCG (1000 UNIT) PO TABS
1000.0000 [IU] | ORAL_TABLET | Freq: Every day | ORAL | Status: DC
Start: 2024-06-07 — End: 2024-06-09
  Administered 2024-06-07 – 2024-06-08 (×2): 1000 [IU] via ORAL
  Filled 2024-06-07 (×2): qty 1

## 2024-06-07 MED ORDER — POLYETHYLENE GLYCOL 3350 17 G PO PACK
17.0000 g | PACK | Freq: Every day | ORAL | Status: DC
Start: 2024-06-07 — End: 2024-06-09
  Administered 2024-06-07 – 2024-06-08 (×2): 17 g via ORAL
  Filled 2024-06-07 (×2): qty 1

## 2024-06-07 MED ORDER — ACETAMINOPHEN 325 MG PO TABS
650.0000 mg | ORAL_TABLET | Freq: Four times a day (QID) | ORAL | Status: DC | PRN
  Filled 2024-06-07: qty 2

## 2024-06-07 MED ORDER — BISACODYL 10 MG RE SUPP: 10.0000 mg | Freq: Every day | RECTAL | Status: DC | PRN

## 2024-06-07 MED ORDER — DULOXETINE HCL 60 MG PO CPEP
60.0000 mg | ORAL_CAPSULE | Freq: Every day | ORAL | Status: DC
  Administered 2024-06-07 – 2024-06-09 (×3): 60 mg via ORAL
  Filled 2024-06-07 (×3): qty 1

## 2024-06-07 MED ORDER — DONEPEZIL HCL 10 MG PO TABS
10.0000 mg | ORAL_TABLET | Freq: Every morning | ORAL | Status: DC
  Administered 2024-06-07 – 2024-06-09 (×3): 10 mg via ORAL
  Filled 2024-06-07 (×2): qty 1
  Filled 2024-06-07: qty 2

## 2024-06-07 MED ORDER — METFORMIN HCL ER 500 MG PO TB24
500.0000 mg | ORAL_TABLET | Freq: Every day | ORAL | Status: DC
  Filled 2024-06-07 (×2): qty 1

## 2024-06-07 MED ORDER — OXYCODONE-ACETAMINOPHEN 5-325 MG PO TABS
1.0000 | ORAL_TABLET | ORAL | Status: DC | PRN
  Administered 2024-06-07 – 2024-06-09 (×13): 1 via ORAL
  Filled 2024-06-07 (×13): qty 1

## 2024-06-07 MED ORDER — SENNA 8.6 MG PO TABS
1.0000 | ORAL_TABLET | Freq: Every day | ORAL | Status: DC
Start: 2024-06-07 — End: 2024-06-09
  Administered 2024-06-08: 8.6 mg via ORAL
  Filled 2024-06-07: qty 1

## 2024-06-07 MED ORDER — LISINOPRIL-HYDROCHLOROTHIAZIDE 20-12.5 MG PO TABS: 2.0000 | ORAL_TABLET | Freq: Every day | ORAL | Status: DC

## 2024-06-07 MED ORDER — GABAPENTIN 300 MG PO CAPS
600.0000 mg | ORAL_CAPSULE | Freq: Two times a day (BID) | ORAL | Status: DC
  Administered 2024-06-07 – 2024-06-09 (×4): 600 mg via ORAL
  Filled 2024-06-07 (×5): qty 2

## 2024-06-07 MED ORDER — METOPROLOL SUCCINATE ER 25 MG PO TB24
25.0000 mg | ORAL_TABLET | Freq: Every day | ORAL | Status: DC
  Administered 2024-06-07 – 2024-06-08 (×2): 25 mg via ORAL
  Filled 2024-06-07 (×3): qty 1

## 2024-06-07 MED ORDER — ATORVASTATIN CALCIUM 80 MG PO TABS
80.0000 mg | ORAL_TABLET | Freq: Every day | ORAL | Status: DC
  Administered 2024-06-07 – 2024-06-09 (×3): 80 mg via ORAL
  Filled 2024-06-07 (×3): qty 1

## 2024-06-07 MED ORDER — ASPIRIN 81 MG PO TBEC
81.0000 mg | DELAYED_RELEASE_TABLET | Freq: Every day | ORAL | Status: DC
  Administered 2024-06-07 – 2024-06-09 (×2): 81 mg via ORAL
  Filled 2024-06-07 (×3): qty 1

## 2024-06-07 MED ORDER — ADULT MULTIVITAMIN W/MINERALS CH
1.0000 | ORAL_TABLET | Freq: Every day | ORAL | Status: DC
  Administered 2024-06-07 – 2024-06-09 (×3): 1 via ORAL
  Filled 2024-06-07 (×3): qty 1

## 2024-06-07 MED ORDER — CEPHALEXIN 250 MG PO CAPS
500.0000 mg | ORAL_CAPSULE | Freq: Four times a day (QID) | ORAL | Status: DC
  Administered 2024-06-07 – 2024-06-09 (×7): 500 mg via ORAL
  Filled 2024-06-07 (×7): qty 2

## 2024-06-07 MED ORDER — METHOCARBAMOL 500 MG PO TABS
500.0000 mg | ORAL_TABLET | Freq: Four times a day (QID) | ORAL | Status: DC | PRN
Start: 2024-06-07 — End: 2024-06-09
  Administered 2024-06-08: 500 mg via ORAL
  Filled 2024-06-07: qty 1

## 2024-06-07 MED ORDER — LISINOPRIL 20 MG PO TABS
40.0000 mg | ORAL_TABLET | Freq: Every day | ORAL | Status: DC
  Administered 2024-06-07 – 2024-06-09 (×3): 40 mg via ORAL
  Filled 2024-06-07 (×3): qty 2

## 2024-06-07 MED ORDER — HYDROCHLOROTHIAZIDE 25 MG PO TABS
25.0000 mg | ORAL_TABLET | Freq: Every day | ORAL | Status: DC
  Administered 2024-06-07 – 2024-06-09 (×3): 25 mg via ORAL
  Filled 2024-06-07 (×3): qty 1

## 2024-06-07 MED ORDER — DOCUSATE SODIUM 100 MG PO CAPS
100.0000 mg | ORAL_CAPSULE | Freq: Two times a day (BID) | ORAL | Status: DC
  Administered 2024-06-07 – 2024-06-09 (×5): 100 mg via ORAL
  Filled 2024-06-07 (×5): qty 1

## 2024-06-07 MED ORDER — ARIPIPRAZOLE 2 MG PO TABS
2.0000 mg | ORAL_TABLET | Freq: Every day | ORAL | Status: DC
  Administered 2024-06-07 – 2024-06-08 (×2): 2 mg via ORAL
  Filled 2024-06-07 (×3): qty 1

## 2024-06-07 NOTE — ED Notes (Signed)
 Assisted patient with opening items on her breakfast tray and repositioning head of bed for eating.

## 2024-06-07 NOTE — [place] FL2 (Signed)
 [place]  MEDICAID FL2 LEVEL OF CARE FORM     IDENTIFICATION  Patient Name: [name] Birthdate: Sep 05, 1946 Sex: female Admission Date (Current Location): 06/06/2024  [facility] and [place] Number:  Producer, television/film/video and Address:  [facility], [address], [place], [place] [address]      Provider Number: [id number]  Attending Physician Name and Address:  Jerral Meth, MD  Relative Name and Phone Number:  Manuelita [name] Blades [phone]    Current Level of Care: Hospital Recommended Level of Care: Skilled Nursing Facility Prior Approval Number:    Date Approved/Denied:   PASRR Number: [id number]  Discharge Plan: SNF    Current Diagnoses: Patient Active Problem List   Diagnosis Date Noted   Fall at home, initial encounter 05/11/2024   Acute pain of right shoulder 05/11/2024   Closed fracture of right proximal humerus 05/11/2024   Fracture of right shoulder 05/11/2024   Leukocytosis 05/11/2024   AKI (acute kidney injury) 05/11/2024   (HFpEF) heart failure with preserved ejection fraction (HCC) 05/11/2024   Controlled type 2 diabetes mellitus without complication, without long-term current use of insulin  (HCC) 05/11/2024   Hyperlipidemia 05/11/2024   Anterior dislocation of right shoulder 05/10/2024   Paroxysmal atrial fibrillation (HCC) 01/01/2024   SVT (supraventricular tachycardia) 01/01/2024   Tremor 01/01/2023   History of lumbar fusion (L3-L5) 09/25/2022   Asthmatic bronchitis 09/04/2022   Complete heart block (HCC) 12/03/2021   HLD (hyperlipidemia) 12/03/2021   Memory loss 12/03/2021   Candidal intertrigo 03/29/2021   History of stroke 07/04/2020   Chronic diastolic CHF (congestive heart failure) (HCC) 06/10/2020   Stage 3a chronic kidney disease (HCC) 06/10/2020   Type II diabetes mellitus with renal manifestations (HCC) 06/10/2020   Abnormal MRI    Chronic narcotic dependence (HCC) 07/04/2019   Urge  incontinence 10/07/2018   Venous insufficiency 10/07/2018   Status post total replacement of left hip 01/08/2018   Chronic pain syndrome 03/13/2017   Facet arthropathy, lumbar 03/13/2017   Right hip pain 08/06/2016   MDD (major depressive disorder), recurrent, in partial remission 05/07/2016   Advance directive discussed with patient 01/15/2016   Lumbar spinal stenosis due to adjacent segment disease after fusion procedure 11/07/2015   Chronic back pain 03/29/2015   Preventative health care 01/10/2015   Primary osteoarthritis of right hip 11/28/2014   Peripheral neuropathy 11/28/2014   Morbid obesity (HCC) 04/05/2014   Type 2 diabetes, controlled, with neuropathy (HCC) 01/20/2014   MCI (mild cognitive impairment) 12/26/2013   IBS (irritable bowel syndrome) 12/27/2012   Failed back surgical syndrome 07/15/2011   Essential hypertension 08/06/2010    Orientation RESPIRATION BLADDER Height & Weight     Time, Self, Situation, Place  Normal Continent Weight:   Height:     BEHAVIORAL SYMPTOMS/MOOD NEUROLOGICAL BOWEL NUTRITION STATUS      Continent Diet (see d/c summary)  AMBULATORY STATUS COMMUNICATION OF NEEDS Skin   Extensive Assist Verbally Surgical wounds                       Personal Care Assistance Level of Assistance  Bathing, Feeding, Dressing Bathing Assistance: Maximum assistance Feeding assistance: Independent Dressing Assistance: Limited assistance     Functional Limitations Info  Speech, Hearing, Sight Sight Info: Adequate Hearing Info: Adequate      SPECIAL CARE FACTORS FREQUENCY  OT (By licensed OT), PT (By licensed PT)     PT Frequency: 5x/week OT Frequency: 5x/week  Contractures Contractures Info: Not present    Additional Factors Info  Code Status, Allergies Code Status Info: full code Allergies Info: no known allergies           Current Medications (06/07/2024):  This is the current hospital active medication list Current  Facility-Administered Medications  Medication Dose Route Frequency Provider Last Rate Last Admin   acetaminophen  (TYLENOL ) tablet 650 mg  650 mg Oral Q6H PRN Kammerer, Megan L, DO       ARIPiprazole  (ABILIFY ) tablet 2 mg  2 mg Oral Daily Kammerer, Megan L, DO   2 mg at 06/07/24 1054   aspirin  EC tablet 81 mg  81 mg Oral Daily Kammerer, Megan L, DO   81 mg at 06/07/24 1055   atorvastatin  (LIPITOR ) tablet 80 mg  80 mg Oral Daily Kammerer, Megan L, DO   80 mg at 06/07/24 1054   bisacodyl  (DULCOLAX) suppository 10 mg  10 mg Rectal Daily PRN Kammerer, Megan L, DO       cholecalciferol  (VITAMIN D3) 25 MCG (1000 UNIT) tablet 1,000 Units  1,000 Units Oral Daily Kammerer, Megan L, DO   1,000 Units at 06/07/24 1054   docusate sodium  (COLACE) capsule 100 mg  100 mg Oral BID Kammerer, Megan L, DO   100 mg at 06/07/24 1054   donepezil  (ARICEPT ) tablet 10 mg  10 mg Oral q morning Kammerer, Megan L, DO   10 mg at 06/07/24 1054   DULoxetine  (CYMBALTA ) DR capsule 60 mg  60 mg Oral Daily Kammerer, Megan L, DO   60 mg at 06/07/24 1055   gabapentin  (NEURONTIN ) capsule 600 mg  600 mg Oral BID Kammerer, Megan L, DO   600 mg at 06/07/24 1054   lisinopril  (ZESTRIL ) tablet 40 mg  40 mg Oral Daily Countryman, Chase, MD   40 mg at 06/07/24 1054   And   hydrochlorothiazide  (HYDRODIURIL ) tablet 25 mg  25 mg Oral Daily Countryman, Chase, MD   25 mg at 06/07/24 1054   [START ON 06/08/2024] metFORMIN  (GLUCOPHAGE -XR) 24 hr tablet 500 mg  500 mg Oral Q breakfast Kammerer, Megan L, DO       methocarbamol  (ROBAXIN ) tablet 500 mg  500 mg Oral Q6H PRN Kammerer, Megan L, DO       metoprolol  succinate (TOPROL -XL) 24 hr tablet 25 mg  25 mg Oral Daily Kammerer, Megan L, DO   25 mg at 06/07/24 1055   multivitamin with minerals tablet 1 tablet  1 tablet Oral Daily Kammerer, Megan L, DO   1 tablet at 06/07/24 1054   oxyCODONE -acetaminophen  (PERCOCET/ROXICET) 5-325 MG per tablet 1 tablet  1 tablet Oral Q4H PRN Groce, Christopher F, PA-C   1  tablet at 06/07/24 1054   polyethylene glycol (MIRALAX  / GLYCOLAX ) packet 17 g  17 g Oral Daily Kammerer, Megan L, DO   17 g at 06/07/24 1055   senna (SENOKOT) tablet 8.6 mg  1 tablet Oral Daily Kammerer, Megan L, DO       Current Outpatient Medications  Medication Sig Dispense Refill   ACCU-CHEK SMARTVIEW test strip Use to check blood sugar once daily 100 each 3   acetaminophen  (TYLENOL ) 325 MG tablet Take 2 tablets (650 mg total) by mouth every 6 (six) hours as needed.     ARIPiprazole  (ABILIFY ) 2 MG tablet Take 2 mg by mouth daily.     aspirin  EC 81 MG tablet Take 1 tablet (81 mg total) by mouth daily.     atorvastatin  (LIPITOR ) 80  MG tablet TAKE 1 TABLET BY MOUTH ONCE  DAILY 100 tablet 3   bisacodyl  (DULCOLAX) 10 MG suppository Place 1 suppository (10 mg total) rectally daily as needed for moderate constipation.     cholecalciferol  (VITAMIN D3) 25 MCG (1000 UNIT) tablet Take 1,000 Units by mouth daily.     docusate sodium  (COLACE) 100 MG capsule Take 1 capsule (100 mg total) by mouth 2 (two) times daily.     donepezil  (ARICEPT ) 10 MG tablet TAKE 1 TABLET BY MOUTH EVERY  MORNING 90 tablet 1   DULoxetine  (CYMBALTA ) 60 MG capsule TAKE 1 CAPSULE BY MOUTH DAILY 90 capsule 3   gabapentin  (NEURONTIN ) 300 MG capsule Take 2 capsules (600 mg total) by mouth 2 (two) times daily. And 900 mg at bedtime 630 capsule 1   lisinopril -hydrochlorothiazide  (ZESTORETIC ) 20-12.5 MG tablet TAKE 2 TABLETS BY MOUTH DAILY 200 tablet 0   metFORMIN  (GLUCOPHAGE -XR) 500 MG 24 hr tablet Take 1 tablet (500 mg total) by mouth daily with breakfast. 90 tablet 3   methocarbamol  (ROBAXIN ) 500 MG tablet Take 1 tablet (500 mg total) by mouth every 6 (six) hours as needed for muscle spasms. 20 tablet 0   metoprolol  succinate (TOPROL -XL) 25 MG 24 hr tablet Take 1 tablet (25 mg total) by mouth daily. 90 tablet 1   Multiple Vitamins-Minerals (MULTIVITAMIN WITH MINERALS) tablet Take 1 tablet by mouth daily.      oxyCODONE  (OXY  IR/ROXICODONE ) 5 MG immediate release tablet Take 0.5-1 tablets (2.5-5 mg total) by mouth every 4 (four) hours as needed (2.5 mg for pain score 4-6, 5 mg for pain score 7-10). 42 tablet 0   polyethylene glycol (MIRALAX  / GLYCOLAX ) 17 g packet Take 17 g by mouth daily.     senna (SENOKOT) 8.6 MG TABS tablet Take 1 tablet (8.6 mg total) by mouth daily.       Discharge Medications: Please see discharge summary for a list of discharge medications.  Relevant Imaging Results:  Relevant Lab Results:   Additional Information SSN: [id number] [place], [place]

## 2024-06-07 NOTE — Progress Notes (Addendum)
 CSW and RNCM met with pt to discuss disposition. Pt was recently discharged from [facility] as she could not afford $200/day copay days. Pt requests that CSW/RNCM speak with her son who she lives with. Pt calls son on speaker phone. Son explains he is unable to care for pt at home. Ultimately he would like LTC placement. CSW explains that pt would need to pursue that placement from the community as she does not currently have medicaid. CSW explained that pt cannot remain in ED just for LTC placement. Explained that a Child psychotherapist could be added to pt's home health to assist with medicaid and LTC placement from the community. Son repeatedly states he cannot care for pt at home. She is not ambulatory and needs assistance with transferring. [facility] did arrange [facility] PT/OT, a hospital bed, and a lift though pt son maintains he is still unable  to care for pt at home. Inpatient Care Management (ICM) to discuss with supervisor.   Discussed with supervisor and will see if pt could be placed for rehab at SNF under her medicare with medicaid pending as medicaid would eventually back pay the copay days and eventually cover LTC.  Fl2 completed and bed requests sent in hub.   1420: Spoke with Logan with [facility]; they are running an assets search before they can confirm bed offer. They would also require initial medicare auth. Spoke with Tammy with [facility] and [facility]; their BOM has left a message with pt's son to discuss finances prior to confirming a bed offer. They would also require initial medicare auth  1525: [facility] can offer after confirming pt's financials and with initial medicare auth.  [facility] can offer with initial medicare auth.  Leontine confirmed that they completed asset search and  that [facility] and [facility] can offer bed with initial SNF auth.  CSW called pt's son and explained decision would be needed today. He states he will notify CSW today with  decision. Explained that if SNF auth is not received pt would likely have to return home and work with a Child psychotherapist in the community for LTC once her medicaid is approved.

## 2024-06-07 NOTE — Evaluation (Signed)
 Physical Therapy Evaluation Patient Details Name: [name] MRN: [id number] DOB: 09/11/1945 Today's Date: 06/07/2024  History of Present Illness  78 y.o. female presents to [facility] hospital on 06/06/2024 due to lack of caregiver support after discharge from SNF. Pt recently underwent ORIF of R humerus prior to short term rehab stay. PMH includes HTN, IBS, mild cognitive impairment, DMII, chronic back pain, spinal stenosis, CHF, CKD III, PAF.  Clinical Impression  Pt presents to PT with deficits in functional mobility, gait, balance, strength, power, endurance. Pt reports she had been working on transferring and ambulating for very short distances with support of a Comptroller when at [employer]. Currently the pt requires physical assistance to stand due to LE weakness and lack of use of RUE. Pt also requries cues for sequencing of step-pivot transfers, expressing a fear of falling during each transfer. PT reached out to orthopedics team to see if pt's weightbearing status and ROM could be upgraded at this point as her surgery was on 05/11/2024. Patient will benefit from continued inpatient follow up therapy, <3 hours/day.      If plan is discharge home, recommend the following: A lot of help with walking and/or transfers;A lot of help with bathing/dressing/bathroom;Assistance with cooking/housework;Assist for transportation;Help with stairs or ramp for entrance   Can travel by private vehicle   No    Equipment Recommendations Hospital bed;Hoyer lift;BSC/3in1;Other (comment) (hemiwalker)  Recommendations for Other Services       Functional Status Assessment Patient has had a recent decline in their functional status and demonstrates the ability to make significant improvements in function in a reasonable and predictable amount of time.     Precautions / Restrictions Precautions Precautions: Fall;Shoulder Type of Shoulder Precautions: Initial ortho recs after surgery: Gentle pendulums  starting POD 1, and limited active shoulder abduction and forward elevation for 1-2 weeks. No other orthopedics notes in chart. PT to reach out to ortho team to see if pt is able to progress weightbearing or ROM during hospital stay Shoulder Interventions: Shoulder sling/immobilizer;For comfort (not present at this time) Precaution Booklet Issued: No Recall of Precautions/Restrictions: Impaired Restrictions Weight Bearing Restrictions Per Provider Order: Yes RUE Weight Bearing Per Provider Order: Non weight bearing      Mobility  Bed Mobility Overal bed mobility: Needs Assistance Bed Mobility: Rolling, Sidelying to Sit, Sit to Supine Rolling: Mod assist Sidelying to sit: Min assist, HOB elevated, Used rails   Sit to supine: Max assist        Transfers Overall transfer level: Needs assistance Equipment used: 1 person hand held assist Transfers: Sit to/from Stand, Bed to chair/wheelchair/BSC Sit to Stand: Min assist   Step pivot transfers: Min assist, From elevated surface       General transfer comment: verbal cues for hand placement, increased anterior weight shift, and for step sequencing when pivoting    Ambulation/Gait                  Stairs            Wheelchair Mobility     Tilt Bed    Modified Rankin (Stroke Patients Only)       Balance Overall balance assessment: Needs assistance Sitting-balance support: No upper extremity supported, Feet supported Sitting balance-Leahy Scale: Fair     Standing balance support: Single extremity supported, Reliant on assistive device for balance Standing balance-Leahy Scale: Poor Standing balance comment: CGA-minA with unilateral UE support  Pertinent Vitals/Pain Pain Assessment Pain Assessment: Faces Faces Pain Scale: Hurts little more Pain Location: back Pain Descriptors / Indicators: Grimacing Pain Intervention(s): Monitored during session    Home  Living Family/patient expects to be discharged to:: Private residence Living Arrangements: Children Available Help at Discharge: Family Type of Home: Mobile home Home Access: Stairs to enter Entrance Stairs-Rails: Doctor, general practice of Steps: 4   Home Layout: One level Home Equipment: Educational psychologist (4 wheels);Hand held shower head;Grab bars - tub/shower      Prior Function Prior Level of Function : Needs assist             Mobility Comments: prior to fall pt was ambulating with a 4 wheeled walker. Pt reports she had been working on ambulating for ~10' with hemiwalker while at [employer] ADLs Comments: Son helps her set up for her shower, and washes her hair.  Pt reports she otherwise bathes and dresses herself. Son does most of the cooking and cleaning.     Extremity/Trunk Assessment   Upper Extremity Assessment Upper Extremity Assessment: Right hand dominant;RUE deficits/detail RUE Deficits / Details: pt with functional R elbow/wrist/digits ROM, shoulder ROM not formally assessed due to uncertainty of continued ROM restrictions from orthopedics LUE Deficits / Details: LUE WFL    Lower Extremity Assessment Lower Extremity Assessment: Generalized weakness    Cervical / Trunk Assessment Cervical / Trunk Assessment: Other exceptions Cervical / Trunk Exceptions: body habitus  Communication   Communication Communication: No apparent difficulties    Cognition Arousal: Alert Behavior During Therapy: WFL for tasks assessed/performed   PT - Cognitive impairments: History of cognitive impairments, Memory, Awareness, Safety/Judgement, Problem solving                         Following commands: Intact Following commands impaired: Follows one step commands with increased time     Cueing Cueing Techniques: Verbal cues, Visual cues, Tactile cues     General Comments General comments (skin integrity, edema, etc.): VSS on RA, PT notes area of  bleeding in L lower abdomen/groin, RN made aware    Exercises     Assessment/Plan    PT Assessment Patient needs continued PT services  PT Problem List Decreased strength;Decreased range of motion;Decreased activity tolerance;Decreased balance;Decreased mobility;Decreased cognition;Decreased knowledge of use of DME;Decreased safety awareness;Decreased knowledge of precautions;Pain       PT Treatment Interventions DME instruction;Gait training;Functional mobility training;Therapeutic activities;Stair training;Therapeutic exercise;Balance training;Neuromuscular re-education;Cognitive remediation;Patient/family education;Wheelchair mobility training    PT Goals (Current goals can be found in the Care Plan section)  Acute Rehab PT Goals Patient Stated Goal: to restore independence during transfers and reduce falls risk during ambulation PT Goal Formulation: With patient Time For Goal Achievement: 06/21/24 Potential to Achieve Goals: Fair    Frequency Min 2X/week     Co-evaluation               AM-PAC PT 6 Clicks Mobility  Outcome Measure Help needed turning from your back to your side while in a flat bed without using bedrails?: A Lot Help needed moving from lying on your back to sitting on the side of a flat bed without using bedrails?: A Lot Help needed moving to and from a bed to a chair (including a wheelchair)?: A Little Help needed standing up from a chair using your arms (e.g., wheelchair or bedside chair)?: A Little Help needed to walk in hospital room?: Total Help needed climbing 3-5 steps with a  railing? : Total 6 Click Score: 12    End of Session Equipment Utilized During Treatment: Gait belt Activity Tolerance: Patient tolerated treatment well Patient left: in bed;with call bell/phone within reach Nurse Communication: Mobility status PT Visit Diagnosis: History of falling (Z91.81);Pain;Difficulty in walking, not elsewhere classified (R26.2) Pain -  Right/Left: Right Pain - part of body: Shoulder    Time: 9166-9081 PT Time Calculation (min) (ACUTE ONLY): 45 min   Charges:   PT Evaluation $PT Eval Low Complexity: 1 Low PT Treatments $Therapeutic Activity: 8-22 mins PT General Charges $$ ACUTE PT VISIT: 1 Visit         Bernardino JINNY Ruth, PT, DPT Acute Rehabilitation Office [phone]   Bernardino JINNY Ruth 06/07/2024, 9:33 AM

## 2024-06-07 NOTE — ED Notes (Signed)
 Called to room by this RN to take lunch tray. Pt reports her lunch does not look appetizing so she does not want to eat. Removed tray from room.

## 2024-06-07 NOTE — ED Notes (Signed)
 Pt stood and pivoted to bedside commode. X 1 assist and verbal cues needed.

## 2024-06-07 NOTE — ED Provider Notes (Signed)
 Emergency Medicine Observation Re-evaluation Note  [name] is a 78 y.o. female, seen on rounds today.  Pt initially presented to the ED for complaints of Shoulder Injury (Needs long term placement) Currently, the patient is sleeping comfortably. No acute events overnight.   Physical Exam  BP (!) 96/53   Pulse 76   Temp 98 F (36.7 C) (Temporal)   Resp 20   SpO2 100%  Physical Exam General: NAD Lungs: No respiratory distress Psych: Calm, cooperative  ED Course / MDM  EKG:EKG Interpretation Date/Time:  Tuesday June 07 2024 00:01:08 EDT Ventricular Rate:  81 PR Interval:  153 QRS Duration:  155 QT Interval:  410 QTC Calculation: 476 R Axis:   269  Text Interpretation: Sinus rhythm Multiple premature complexes, vent & supraven Nonspecific IVCD with LAD Inferior infarct, acute Anterior infarct, acute (LAD) Lateral leads are also involved Similar to prior Confirmed by Jerral Meth [id number]) on 06/07/2024 12:06:58 AM  I have reviewed the labs performed to date as well as medications administered while in observation.  Recent changes in the last 24 hours include none.  Plan  Current plan is for [facility] for placement.     Gennaro Duwaine CROME, DO 06/07/24 [phone]

## 2024-06-07 NOTE — ED Notes (Signed)
 Pt incont of stool and urine. Peri care done and brief changed.  Educated pt on calling out for help when she needs to use the bathroom so that she can use the bedpan or bedside commode.

## 2024-06-07 NOTE — ED Notes (Signed)
 Pt called out stating her alarm just went off and it's time for her pain medication. Educated patient on use of as needed medication. Verbalized understanding. Rates pain 7/10. See MAR.

## 2024-06-07 NOTE — ED Notes (Signed)
PT at bedside assessing patient.

## 2024-06-07 NOTE — ED Notes (Signed)
 Pt urinated in brief. Pt brief changed and peri-care complete at this time. Pt tolerated well.

## 2024-06-07 NOTE — Evaluation (Signed)
 Occupational Therapy Evaluation Patient Details Name: [name] MRN: [id number] DOB: [birth year] Today's Date: 06/07/2024   History of Present Illness   78 y.o. female presents to [facility] hospital on 06/06/2024 due to lack of caregiver support after discharge from SNF. Pt recently underwent ORIF of R humerus prior to short term rehab stay. PMH includes HTN, IBS, mild cognitive impairment, DMII, chronic back pain, spinal stenosis, CHF, CKD III, PAF.     Clinical Impressions Pt admitted from SNF via ems transports due to limited family support. Pt reports son does not work and lives with her but declines to help with hygiene needs. Dr Kendal communicated to therapy: ROM as tolerated with NWB R UE precautions 06/07/24. Pt benefits from OOB or bed in chair position 3 times per day to promote upright posture. Recommend use of BSC to increase activity tolerance. Recommendation for skilled inpatient follow up therapy, <3 hours/day.      If plan is discharge home, recommend the following:   Two people to help with walking and/or transfers;Two people to help with bathing/dressing/bathroom;Assistance with cooking/housework;Direct supervision/assist for medications management;Direct supervision/assist for financial management;Assist for transportation;Help with stairs or ramp for entrance;Supervision due to cognitive status     Functional Status Assessment   Patient has had a recent decline in their functional status and demonstrates the ability to make significant improvements in function in a reasonable and predictable amount of time.     Equipment Recommendations   Wheelchair cushion (measurements OT);Wheelchair (measurements OT);BSC/3in1     Recommendations for Other Services   PT consult     Precautions/Restrictions   Precautions Precautions: Fall;Shoulder Type of Shoulder Precautions: [place] 9/30 message reports ROM as tolerated but NWB Restrictions Weight Bearing  Restrictions Per Provider Order: Yes RUE Weight Bearing Per Provider Order: Non weight bearing     Mobility Bed Mobility Overal bed mobility: Needs Assistance Bed Mobility: Rolling, Supine to Sit, Sit to Supine Rolling: Mod assist   Supine to sit: Mod assist Sit to supine: Max assist   General bed mobility comments: pt requires cues to sequence transfer and (A) to place R UE in a safe position. pt pushign up off L UE. pt needs (A) to elevate trunk. MAx (A) to scoot forward to eob with pad to help with trunk elevation. pt requires total (A) bil LE back onto bed surface    Transfers Overall transfer level: Needs assistance Equipment used: 1 person hand held assist Transfers: Sit to/from Stand Sit to Stand: Max assist           General transfer comment: elevated surface. pt attempting sit<>STand and unable to complete. pt with elevated surface able to stand and take 3 steps toward [facility].      Balance Overall balance assessment: Needs assistance Sitting-balance support: Single extremity supported, Feet supported Sitting balance-Leahy Scale: Fair     Standing balance support: During functional activity, Reliant on assistive device for balance, Single extremity supported Standing balance-Leahy Scale: Poor                             ADL either performed or assessed with clinical judgement   ADL Overall ADL's : Needs assistance/impaired Eating/Feeding: Minimal assistance   Grooming: Minimal assistance       Lower Body Bathing: Total assistance       Lower Body Dressing: Total assistance     Toilet Transfer Details (indicate cue type and reason): pt declined to transfer to  the [facility] and currently with brief. pt declines feeling wet           General ADL Comments: focused on bed level R UE movement then eob sitting for movement     Vision   Vision Assessment?: No apparent visual deficits     Perception         Praxis         Pertinent  Vitals/Pain Pain Assessment Pain Assessment: 0-10 Pain Score: 7  Pain Location: shoulder Pain Descriptors / Indicators: Grimacing Pain Intervention(s): Monitored during session, Premedicated before session, Repositioned     Extremity/Trunk Assessment Upper Extremity Assessment Upper Extremity Assessment: Right hand dominant;RUE deficits/detail RUE Deficits / Details: pt with AAROM tolerated 90 degrees flexion, 20 degrees abduction, full elbow extension, supination/ pronation WFL, able to complete scapula movement in all planes LUE Deficits / Details: LUE WFL   Lower Extremity Assessment Lower Extremity Assessment: Generalized weakness   Cervical / Trunk Assessment Cervical / Trunk Assessment: Other exceptions Cervical / Trunk Exceptions: body habitus   Communication Communication Communication: No apparent difficulties   Cognition Arousal: Alert Behavior During Therapy: WFL for tasks assessed/performed Cognition: History of cognitive impairments, No family/caregiver present to determine baseline, Cognition impaired             OT - Cognition Comments: oriented to place and some prior exercises with good return demo                 Following commands: Intact       Cueing  General Comments   Cueing Techniques: Verbal cues;Gestural cues  RA   Exercises Exercises: Other exercises Other Exercises Other Exercises: AARom with LUE under R UE to complete shoulder flexion supine and to abduct / adduct arm (rock the baby) x20 reps Other Exercises: AAROM elbow flexion extension, supination/ pronation x10 reps. noted to still have bruising and edema at the R shoulder Other Exercises: AROM scapula movement in all planes x10 Other Exercises: static sitting dangle with some limitations due to body habitus . pt able to complete straigh arm hang on the lateral aspect of R LE   Shoulder Instructions      Home Living Family/patient expects to be discharged to:: Private  residence Living Arrangements: Children (son) Available Help at Discharge: Family Type of Home: Mobile home Home Access: Stairs to enter Entrance Stairs-Number of Steps: 4 Entrance Stairs-Rails: Right;Left Home Layout: One level     Bathroom Shower/Tub: Chief Strategy Officer: Handicapped height     Home Equipment: Educational psychologist (4 wheels);Hand held shower head;Grab bars - tub/shower   Additional Comments: pt reports that son does not work and pending disability. pt reports disability is not for physical needs. pt reports son declines to help with any hygiene      Prior Functioning/Environment Prior Level of Function : Needs assist             Mobility Comments: prior to fall pt was ambulating with a 4 wheeled walker. Pt reports she had been working on ambulating for ~10' with hemiwalker while at [employer] [facility] ADLs Comments: Son helps her set up for her shower, and washes her hair.  Pt reports she otherwise bathes and dresses herself. Son does most of the cooking and cleaning.    OT Problem List: Decreased strength;Decreased range of motion;Decreased activity tolerance;Impaired balance (sitting and/or standing);Decreased coordination;Decreased knowledge of use of DME or AE;Decreased knowledge of precautions;Impaired UE functional use;Pain;Decreased safety awareness;Obesity   OT Treatment/Interventions: Self-care/ADL  training;Therapeutic exercise;DME and/or AE instruction;Energy conservation;Therapeutic activities;Cognitive remediation/compensation;Patient/family education;Balance training;Manual therapy;Modalities      OT Goals(Current goals can be found in the care plan section)   Acute Rehab OT Goals Patient Stated Goal: would like more help at home OT Goal Formulation: With patient Time For Goal Achievement: 06/21/24 Potential to Achieve Goals: Good   OT Frequency:  Min 2X/week    Co-evaluation              AM-PAC OT 6 Clicks Daily  Activity     Outcome Measure Help from another person eating meals?: A Lot Help from another person taking care of personal grooming?: A Lot Help from another person toileting, which includes using toliet, bedpan, or urinal?: Total Help from another person bathing (including washing, rinsing, drying)?: A Lot Help from another person to put on and taking off regular upper body clothing?: A Lot Help from another person to put on and taking off regular lower body clothing?: Total 6 Click Score: 10   End of Session Nurse Communication: Mobility status;Precautions;Weight bearing status  Activity Tolerance: Patient tolerated treatment well Patient left: in bed;with call bell/phone within reach  OT Visit Diagnosis: Unsteadiness on feet (R26.81);Other abnormalities of gait and mobility (R26.89);History of falling (Z91.81);Muscle weakness (generalized) (M62.81);Other symptoms and signs involving cognitive function;Pain Pain - Right/Left: Right Pain - part of body: Arm                Time: 8972-8953 OT Time Calculation (min): 19 min Charges:  OT General Charges $OT Visit: 1 Visit OT Evaluation $OT Eval Moderate Complexity: 1 Mod   Brynn, OTR/L  Acute Rehabilitation Services Office: [phone] .   Ely Molt 06/07/2024, 11:16 AM

## 2024-06-08 MED ORDER — BACITRACIN ZINC 500 UNIT/GM EX OINT
TOPICAL_OINTMENT | CUTANEOUS | Status: AC
  Filled 2024-06-08: qty 0.9

## 2024-06-08 NOTE — ED Notes (Signed)
 Pt educated on risks associated with frequent narcotic intake, pt verbalized understanding. Pt noted to have restarted timer on phone after medication administration.

## 2024-06-08 NOTE — TOC Progression Note (Addendum)
 Transition of Care [facility]) - Progression Note    Patient Details  Name: [name] MRN: [id number] Date of Birth: 07/29/1946  Transition of [facility]) CM/SW Contact  Luann SHAUNNA [place], [place] Phone Number: 06/08/2024, 9:43 AM  Clinical Narrative:     Called pt's son multiple times with no answer.  Left voicemail requesting return call.   Met with pt bedside, she reports they chose [facility].  CSW confirmed bed offer with [facility]; they would require medicare auth to admit pt.   SNF auth request submitted in online portal and is currently pending. Ref#   [id number]        Social Drivers of Health (SDOH) Interventions SDOH Screenings   Food Insecurity: No Food Insecurity (05/11/2024)  Housing: Low Risk  (05/11/2024)  Transportation Needs: No Transportation Needs (05/11/2024)  Utilities: Not At Risk (05/11/2024)  Depression (PHQ2-9): Low Risk  (01/01/2024)  Financial Resource Strain: Low Risk  (12/15/2022)  Physical Activity: Unknown (12/15/2022)  Social Connections: Socially Isolated (05/11/2024)  Stress: No Stress Concern Present (12/15/2022)  Tobacco Use: Low Risk  (05/11/2024)    Readmission Risk Interventions    12/05/2021    3:32 PM  Readmission Risk Prevention Plan  Transportation Screening Complete  PCP or Specialist Appt within 5-7 Days Complete  Home Care Screening Complete  Medication Review (RN CM) Complete

## 2024-06-08 NOTE — ED Notes (Signed)
 Incontinence care provided, brief changed, bed linens changed. Pt repositioned for comfort using several pillows. Pt's many requests were fulfilled. Call bell in reach, safety maintained.

## 2024-06-08 NOTE — ED Notes (Signed)
 PT RESTING  CALL LIGHT WITHIN REACH.

## 2024-06-08 NOTE — Progress Notes (Signed)
Auth still pending at this time.

## 2024-06-08 NOTE — Progress Notes (Signed)
 Occupational Therapy Treatment Patient Details Name: [name] MRN: [id number] DOB: 04-25-1946 Today's Date: 06/08/2024   History of present illness 78 y.o. female presents to [facility] on 06/06/2024 due to lack of caregiver support after discharge from SNF. Pt recently underwent ORIF of R humerus prior to short term rehab stay. PMH includes HTN, IBS, mild cognitive impairment, DMII, chronic back pain, spinal stenosis, CHF, CKD III, PAF.   OT comments  Patient able to get to EOB with mod assist and with complaints of 4/10 shoulder pain.  Patient performed AAROM to RUE shoulder with rock the baby, leg slides, and table slides. Patient able to perform shoulder flexion pendulum swings while standing. Patient able to stand from EOB with hemi-walker with mod assist for pendulum swings and on second stand took side steps towards [facility] with min assist for balance. Patient returned to supine with max assist and stated increased shoulder pain and was positioned in bed to address with ice packs.  Patient will benefit from continued inpatient follow up therapy, <3 hours/day.  Acute OT to continue to follow to address established goals to facilitate DC to next venue of care.        If plan is discharge home, recommend the following:  Two people to help with walking and/or transfers;Two people to help with bathing/dressing/bathroom;Assistance with cooking/housework;Direct supervision/assist for medications management;Direct supervision/assist for financial management;Assist for transportation;Help with stairs or ramp for entrance;Supervision due to cognitive status   Equipment Recommendations  Wheelchair cushion (measurements OT);Wheelchair (measurements OT);BSC/3in1    Recommendations for Other Services      Precautions / Restrictions Precautions Precautions: Fall;Shoulder Type of Shoulder Precautions: Haddix 9/30 message reports ROM as tolerated but NWB Restrictions Weight Bearing Restrictions  Per Provider Order: Yes RUE Weight Bearing Per Provider Order: Non weight bearing       Mobility Bed Mobility Overal bed mobility: Needs Assistance Bed Mobility: Rolling, Supine to Sit, Sit to Supine Rolling: Mod assist   Supine to sit: Mod assist Sit to supine: Max assist   General bed mobility comments: required assistance to raise trunk for getting to EOB and assistance with BLEs to return to supine    Transfers Overall transfer level: Needs assistance Equipment used: Hemi-walker Transfers: Sit to/from Stand Sit to Stand: Mod assist, From elevated surface           General transfer comment: mod assist to power up and min assist of balance, patient able to take side steps towards [facility]     Balance Overall balance assessment: Needs assistance Sitting-balance support: Single extremity supported, Feet supported Sitting balance-Leahy Scale: Fair     Standing balance support: During functional activity, Reliant on assistive device for balance, Single extremity supported Standing balance-Leahy Scale: Poor Standing balance comment: min to CGA for balance while standing with hemi-walker for support                           ADL either performed or assessed with clinical judgement   ADL Overall ADL's : Needs assistance/impaired                                       General ADL Comments: focused on RUE HEP and sit to stands from EOB    Extremity/Trunk Assessment Upper Extremity Assessment Upper Extremity Assessment: Left hand dominant RUE Deficits / Details: pt with AAROM tolerated 90  degrees flexion, 20 degrees abduction, full elbow extension, supination/ pronation WFL, able to complete scapula movement in all planes            Vision       Perception     Praxis     Communication Communication Communication: No apparent difficulties   Cognition Arousal: Alert Behavior During Therapy: WFL for tasks assessed/performed Cognition:  History of cognitive impairments, No family/caregiver present to determine baseline, Cognition impaired             OT - Cognition Comments: able to recall precautions                 Following commands: Intact        Cueing   Cueing Techniques: Verbal cues, Gestural cues  Exercises Exercises: Other exercises Other Exercises Other Exercises: performd AAROM with LUE under RUE (rock the baby) x20 reps. Other Exercises: AAROM for shoulder flexion with sliding hand down leg past knee per tolerance x20 reps and table slides Other Exercises: Shoulder shrugs x10 Other Exercises: pendulum swings while standing for shoulder flexion x10    Shoulder Instructions       General Comments      Pertinent Vitals/ Pain       Pain Assessment Pain Assessment: 0-10 Pain Score: 4  Pain Location: shoulder Pain Descriptors / Indicators: Grimacing Pain Intervention(s): Limited activity within patient's tolerance, Monitored during session, Repositioned, Ice applied  Home Living                                          Prior Functioning/Environment              Frequency  Min 2X/week        Progress Toward Goals  OT Goals(current goals can now be found in the care plan section)  Progress towards OT goals: Progressing toward goals  Acute Rehab OT Goals Patient Stated Goal: to go home OT Goal Formulation: With patient Time For Goal Achievement: 06/21/24 Potential to Achieve Goals: Good ADL Goals Pt Will Transfer to Toilet: with mod assist;stand pivot transfer Additional ADL Goal #1: Pt will complete R UE HEP min (A) with written instructions for visual cues Additional ADL Goal #2: pt will complete bed mobility min (A) as precursor to adls. Additional ADL Goal #3: pt will complete basic transfers min (A) with hemi walker  Plan      Co-evaluation                 AM-PAC OT 6 Clicks Daily Activity     Outcome Measure   Help from another  person eating meals?: A Lot Help from another person taking care of personal grooming?: A Lot Help from another person toileting, which includes using toliet, bedpan, or urinal?: Total Help from another person bathing (including washing, rinsing, drying)?: A Lot Help from another person to put on and taking off regular upper body clothing?: A Lot Help from another person to put on and taking off regular lower body clothing?: Total 6 Click Score: 10    End of Session Equipment Utilized During Treatment: Gait belt;Other (comment) (hemi-walker)  OT Visit Diagnosis: Unsteadiness on feet (R26.81);Other abnormalities of gait and mobility (R26.89);History of falling (Z91.81);Muscle weakness (generalized) (M62.81);Other symptoms and signs involving cognitive function;Pain Pain - Right/Left: Right Pain - part of body: Arm   Activity Tolerance Patient tolerated treatment well  Patient Left in bed;with call bell/phone within reach   Nurse Communication Mobility status;Precautions;Weight bearing status        Time: 9148-9074 OT Time Calculation (min): 34 min  Charges: OT General Charges $OT Visit: 1 Visit OT Treatments $Therapeutic Activity: 8-22 mins $Therapeutic Exercise: 8-22 mins  Dick Laine, OTA Acute Rehabilitation Services  Office [phone]   Jeb LITTIE Laine 06/08/2024, 2:22 PM

## 2024-06-08 NOTE — ED Provider Notes (Signed)
 Emergency Medicine Observation Re-evaluation Note  [name] is a 78 y.o. female, seen on rounds today.  Pt initially presented to the ED for complaints of Shoulder Injury (Needs long term placement) Patient was recently discharged from SNF, went home with son, however patient is incontinent, and requires help with ADLs and son does not feel like he is able to assist with these needs. Currently, the patient is resting comfortably, eating breakfast.  Physical Exam  BP (!) 116/49   Pulse 80   Temp 97.7 F (36.5 C)   Resp 18   SpO2 98%  Physical Exam General: NAD Lungs: Normal effort Psych: Calm, cooperative  ED Course / MDM  EKG:EKG Interpretation Date/Time:  Tuesday June 07 2024 00:01:08 EDT Ventricular Rate:  81 PR Interval:  153 QRS Duration:  155 QT Interval:  410 QTC Calculation: 476 R Axis:   269  Text Interpretation: Sinus rhythm Multiple premature complexes, vent & supraven Nonspecific IVCD with LAD Inferior infarct, acute Anterior infarct, acute (LAD) Lateral leads are also involved Similar to prior Confirmed by Jerral Meth [phone]) on 06/07/2024 12:06:58 AM  I have reviewed the labs performed to date as well as medications administered while in observation.  Recent changes in the last 24 hours include no new results or interventions since initial evaluation.  Home medications have previously been ordered.  Plan  Current plan is for pending placement by TOC.    Rogelia Jerilynn RAMAN, MD 06/08/24 1256

## 2024-06-09 ENCOUNTER — Encounter: Admitting: Family Medicine

## 2024-06-09 DIAGNOSIS — R404 Transient alteration of awareness: Secondary | ICD-10-CM | POA: Diagnosis not present

## 2024-06-09 DIAGNOSIS — M549 Dorsalgia, unspecified: Secondary | ICD-10-CM | POA: Diagnosis not present

## 2024-06-09 DIAGNOSIS — Z7401 Bed confinement status: Secondary | ICD-10-CM | POA: Diagnosis not present

## 2024-06-09 DIAGNOSIS — R410 Disorientation, unspecified: Secondary | ICD-10-CM | POA: Diagnosis not present

## 2024-06-09 MED ORDER — TRAMADOL HCL 50 MG PO TABS: 50.0000 mg | ORAL_TABLET | Freq: Four times a day (QID) | ORAL | 0 refills | Status: AC | PRN

## 2024-06-09 NOTE — ED Notes (Signed)
 Patient discharged to [facility] via [facility]. Patient report given to Karol from [facility]. Patient awake, alert and orientated. Patient's VS stable. All patient's belongings given to [facility].

## 2024-06-09 NOTE — ED Provider Notes (Signed)
 Emergency Medicine Observation Re-evaluation Note  [name] is a 78 y.o. female, seen on rounds today.  Pt initially presented to the ED for complaints of Shoulder Injury (Needs long term placement) Currently, the patient is eating breakfast.  Physical Exam  BP 127/68 (BP Location: Left Arm)   Pulse 81   Temp 97.6 F (36.4 C) (Oral)   Resp 20   SpO2 95%  Physical Exam General: No acute distress Lungs: No respiratory distress Psych: Calm  ED Course / MDM  EKG:EKG Interpretation Date/Time:  Tuesday June 07 2024 00:01:08 EDT Ventricular Rate:  81 PR Interval:  153 QRS Duration:  155 QT Interval:  410 QTC Calculation: 476 R Axis:   269  Text Interpretation: Sinus rhythm Multiple premature complexes, vent & supraven Nonspecific IVCD with LAD Inferior infarct, acute Anterior infarct, acute (LAD) Lateral leads are also involved Similar to prior Confirmed by Jerral Meth [phone]) on 06/07/2024 12:06:58 AM  I have reviewed the labs performed to date as well as medications administered while in observation.  Recent changes in the last 24 hours include the patient has been accepted to a SNF.  Plan  Current plan is for SNF placement.    Ula Prentice SAUNDERS, MD 06/09/24 [phone]

## 2024-06-09 NOTE — Discharge Instructions (Signed)
 Please go to your skilled nursing facility for rehabilitation and return to the ER for worsening symptoms.

## 2024-06-09 NOTE — ED Notes (Signed)
 Ptar present to take patient to [facility]. Patient report given to Karol.

## 2024-06-09 NOTE — ED Notes (Signed)
 [facility] scheduled for the patient to return to [facility].  No ETA available, but it shouldn't be too long per dispatch.

## 2024-06-09 NOTE — Progress Notes (Signed)
 Pt for dc to [facility].  Auth received: [id number], 10/1-10/3.  Unable to reach pt's son.  Spoke to pt's neighbor listed on facesheet Santana [phone] and she agreed to update son.  Confirmed with Leont[facility] they are prepared to admit pt to room 209B.  RN provided with number for report.  SW signing off at dc.   Julien Das, MSW, LCSW [phone] (coverage)

## 2024-06-09 NOTE — ED Notes (Addendum)
 Patient report called to Erminio, RN to [facility].

## 2024-06-10 NOTE — Telephone Encounter (Signed)
 Spoke to patient she will call to schedule TOC with Dr. Avelina once she is discharged from rehab facility she is currently placed.

## 2024-06-16 ENCOUNTER — Ambulatory Visit

## 2024-06-22 ENCOUNTER — Telehealth: Payer: Self-pay

## 2024-06-22 NOTE — Telephone Encounter (Signed)
 Received med refill from [facility] for 2 medications. Per her last note in the chart from the ER, the family was seeking placement to a rehab assisted living facility. I called and spoke to her. She is in a facility that does her mediations.

## 2024-06-28 ENCOUNTER — Ambulatory Visit

## 2024-07-04 ENCOUNTER — Encounter: Admitting: Family

## 2024-08-09 ENCOUNTER — Ambulatory Visit

## 2024-08-09 ENCOUNTER — Telehealth: Payer: Self-pay

## 2024-08-09 NOTE — Telephone Encounter (Signed)
 Copied from CRM #[id number]. Topic: General - Other >> Aug 09, 2024  3:49 PM [name] wrote: Reason for CRM: patient stated she is returning a call from Parnell at the office. After calling the office and letting the patient know we did not have a Brooke in the office, I did let her know she had an appointment today that was at 3:40 with the medicare nurse. I asked the patient if she want to reschedule and she told me no
